# Patient Record
Sex: Female | Born: 1960 | Race: Black or African American | Hispanic: No | Smoking: Former smoker
Health system: Southern US, Community
[De-identification: ages and names within clinical notes are randomized; demographics above are authoritative.]

## PROBLEM LIST (undated history)

## (undated) DIAGNOSIS — J449 Chronic obstructive pulmonary disease, unspecified: Secondary | ICD-10-CM

## (undated) DIAGNOSIS — K219 Gastro-esophageal reflux disease without esophagitis: Secondary | ICD-10-CM

## (undated) HISTORY — PX: COLONOSCOPY WITH ESOPHAGOGASTRODUODENOSCOPY (EGD): SHX5779

---

## 2005-03-06 ENCOUNTER — Emergency Department: Payer: Self-pay | Admitting: Emergency Medicine

## 2006-03-12 ENCOUNTER — Ambulatory Visit: Payer: Self-pay | Admitting: Family Medicine

## 2008-03-05 ENCOUNTER — Ambulatory Visit: Payer: Self-pay | Admitting: Family Medicine

## 2009-11-01 ENCOUNTER — Emergency Department: Payer: Self-pay | Admitting: Internal Medicine

## 2010-08-07 ENCOUNTER — Ambulatory Visit: Payer: Self-pay | Admitting: Gastroenterology

## 2010-08-09 LAB — PATHOLOGY REPORT

## 2010-09-13 ENCOUNTER — Ambulatory Visit: Payer: Self-pay

## 2012-03-05 ENCOUNTER — Ambulatory Visit: Payer: Self-pay | Admitting: Family Medicine

## 2013-03-23 ENCOUNTER — Ambulatory Visit: Payer: Self-pay | Admitting: Nurse Practitioner

## 2014-04-27 ENCOUNTER — Ambulatory Visit: Payer: Self-pay | Admitting: Family Medicine

## 2015-06-23 ENCOUNTER — Other Ambulatory Visit: Payer: Self-pay | Admitting: Family Medicine

## 2015-06-23 DIAGNOSIS — Z1239 Encounter for other screening for malignant neoplasm of breast: Secondary | ICD-10-CM

## 2015-06-29 ENCOUNTER — Ambulatory Visit
Admission: RE | Admit: 2015-06-29 | Discharge: 2015-06-29 | Disposition: A | Discharge status: Home / Self Care | Payer: BLUE CROSS/BLUE SHIELD | Source: Ambulatory Visit | Attending: Family Medicine | Admitting: Family Medicine

## 2015-06-29 DIAGNOSIS — Z1239 Encounter for other screening for malignant neoplasm of breast: Secondary | ICD-10-CM

## 2015-06-29 DIAGNOSIS — Z1231 Encounter for screening mammogram for malignant neoplasm of breast: Secondary | ICD-10-CM | POA: Diagnosis present

## 2015-11-09 ENCOUNTER — Encounter: Payer: Self-pay | Admitting: *Deleted

## 2015-11-14 ENCOUNTER — Encounter
Admission: RE | Disposition: A | Discharge status: Home / Self Care | Payer: Self-pay | Source: Ambulatory Visit | Attending: Gastroenterology

## 2015-11-14 ENCOUNTER — Ambulatory Visit
Admission: RE | Admit: 2015-11-14 | Discharge: 2015-11-14 | Disposition: A | Discharge status: Home / Self Care | Payer: BLUE CROSS/BLUE SHIELD | Source: Ambulatory Visit | Attending: Gastroenterology | Admitting: Gastroenterology

## 2015-11-14 ENCOUNTER — Encounter: Payer: Self-pay | Admitting: *Deleted

## 2015-11-14 ENCOUNTER — Ambulatory Visit: Payer: BLUE CROSS/BLUE SHIELD | Admitting: Anesthesiology

## 2015-11-14 DIAGNOSIS — K210 Gastro-esophageal reflux disease with esophagitis: Secondary | ICD-10-CM | POA: Insufficient documentation

## 2015-11-14 DIAGNOSIS — Z833 Family history of diabetes mellitus: Secondary | ICD-10-CM | POA: Diagnosis not present

## 2015-11-14 DIAGNOSIS — D123 Benign neoplasm of transverse colon: Secondary | ICD-10-CM | POA: Insufficient documentation

## 2015-11-14 DIAGNOSIS — Z8249 Family history of ischemic heart disease and other diseases of the circulatory system: Secondary | ICD-10-CM | POA: Insufficient documentation

## 2015-11-14 DIAGNOSIS — D124 Benign neoplasm of descending colon: Secondary | ICD-10-CM | POA: Insufficient documentation

## 2015-11-14 DIAGNOSIS — Z800 Family history of malignant neoplasm of digestive organs: Secondary | ICD-10-CM | POA: Insufficient documentation

## 2015-11-14 DIAGNOSIS — F17210 Nicotine dependence, cigarettes, uncomplicated: Secondary | ICD-10-CM | POA: Diagnosis not present

## 2015-11-14 DIAGNOSIS — J449 Chronic obstructive pulmonary disease, unspecified: Secondary | ICD-10-CM | POA: Insufficient documentation

## 2015-11-14 DIAGNOSIS — Z79899 Other long term (current) drug therapy: Secondary | ICD-10-CM | POA: Diagnosis not present

## 2015-11-14 DIAGNOSIS — D122 Benign neoplasm of ascending colon: Secondary | ICD-10-CM | POA: Diagnosis not present

## 2015-11-14 DIAGNOSIS — Z86010 Personal history of colonic polyps: Secondary | ICD-10-CM | POA: Insufficient documentation

## 2015-11-14 HISTORY — DX: Gastro-esophageal reflux disease without esophagitis: K21.9

## 2015-11-14 HISTORY — PX: COLONOSCOPY WITH PROPOFOL: SHX5780

## 2015-11-14 HISTORY — PX: ESOPHAGOGASTRODUODENOSCOPY (EGD) WITH PROPOFOL: SHX5813

## 2015-11-14 SURGERY — COLONOSCOPY WITH PROPOFOL
Anesthesia: General

## 2015-11-14 MED ORDER — PROPOFOL 10 MG/ML IV BOLUS
INTRAVENOUS | Status: DC | PRN
  Administered 2015-11-14 (×3): 50 mg via INTRAVENOUS

## 2015-11-14 MED ORDER — SODIUM CHLORIDE 0.9 % IV SOLN: INTRAVENOUS | Status: DC

## 2015-11-14 MED ORDER — SODIUM CHLORIDE 0.9 % IV SOLN
INTRAVENOUS | Status: DC
  Administered 2015-11-14: 1000 mL via INTRAVENOUS

## 2015-11-14 MED ORDER — PROPOFOL 500 MG/50ML IV EMUL
INTRAVENOUS | Status: DC | PRN
  Administered 2015-11-14: 150 ug/kg/min via INTRAVENOUS

## 2015-11-14 NOTE — H&P (Signed)
  Date of Initial H&P: 10/24/2015  History reviewed, patient examined, no change in status, stable for surgery.

## 2015-11-14 NOTE — Anesthesia Postprocedure Evaluation (Signed)
Anesthesia Post Note  Patient: Brittney Fisher  Procedure(s) Performed: Procedure(s) (LRB): COLONOSCOPY WITH PROPOFOL (N/A) ESOPHAGOGASTRODUODENOSCOPY (EGD) WITH PROPOFOL (N/A)  Patient location during evaluation: PACU Anesthesia Type: General Level of consciousness: awake and alert Pain management: pain level controlled Vital Signs Assessment: post-procedure vital signs reviewed and stable Respiratory status: spontaneous breathing Cardiovascular status: stable Anesthetic complications: no    Last Vitals:  Filed Vitals:   11/14/15 0943 11/14/15 1044  BP: 131/75 112/68  Pulse: 82 87  Temp: 36.7 C 36 C  Resp: 20 16    Last Pain: There were no vitals filed for this visit.               VAN STAVEREN,[name]

## 2015-11-14 NOTE — Op Note (Signed)
[facility] Patient Name: Brittney Fisher Procedure Date: 11/14/2015 10:09 AM MRN: [id number] Account #: [id number] Date of Birth: 1961/08/12 Admit Type: Outpatient Age: 54 Room: [facility] ENDO ROOM 4 Gender: Female Note Status: Finalized Procedure:         Colonoscopy Indications:       Personal history of colonic polyps Providers:         Lupita Dawn. Candace Cruise, MD Referring MD:      Forest Gleason Md, MD (Referring MD) Medicines:         Monitored Anesthesia Care Complications:     No immediate complications. Procedure:         Pre-Anesthesia Assessment:                    - Prior to the procedure, a History and Physical was                     performed, and patient medications, allergies and                     sensitivities were reviewed. The patient's tolerance of                     previous anesthesia was reviewed.                    - The risks and benefits of the procedure and the sedation                     options and risks were discussed with the patient. All                     questions were answered and informed consent was obtained.                    - After reviewing the risks and benefits, the patient was                     deemed in satisfactory condition to undergo the procedure.                    After obtaining informed consent, the colonoscope was                     passed under direct vision. Throughout the procedure, the                     patient's blood pressure, pulse, and oxygen saturations                     were monitored continuously. The Colonoscope was                     introduced through the anus and advanced to the the cecum,                     identified by appendiceal orifice and ileocecal valve. The                     colonoscopy was performed without difficulty. The patient                     tolerated the procedure well. The quality of the bowel  preparation was good. Findings:      A medium  polyp was found in the ascending colon. The polyp was sessile.       The polyp was removed with a hot snare. Resection and retrieval were       complete.      A small polyp was found in the ascending colon. The polyp was sessile.       The polyp was removed with a cold snare. Resection and retrieval were       complete.      A small polyp was found at the hepatic flexure. The polyp was sessile.       The polyp was removed with a jumbo cold forceps. Resection and retrieval       were complete.      A small polyp was found in the descending colon. The polyp was sessile.       The polyp was removed with a jumbo cold forceps. Resection and retrieval       were complete.      The exam was otherwise without abnormality. Impression:        - One medium polyp in the ascending colon. Resected and                     retrieved.                    - One small polyp in the ascending colon. Resected and                     retrieved.                    - One small polyp at the hepatic flexure. Resected and                     retrieved.                    - One small polyp in the descending colon. Resected and                     retrieved.                    - The examination was otherwise normal. Recommendation:    - Discharge patient to home.                    - Await pathology results.                    - Repeat colonoscopy in 3 years for surveillance.                    - The findings and recommendations were discussed with the                     patient. Procedure Code(s): --- Professional ---                    [phone], Colonoscopy, flexible; with removal of tumor(s),                     polyp(s), or other lesion(s) by snare technique                    44034, 82, Colonoscopy, flexible; with biopsy, single or  multiple Diagnosis Code(s): --- Professional ---                    D12.2, Benign neoplasm of ascending colon                    D12.3, Benign neoplasm of  transverse colon                    D12.4, Benign neoplasm of descending colon                    Z86.010, Personal history of colonic polyps CPT copyright 2014 American Medical Association. All rights reserved. The codes documented in this report are preliminary and upon coder review may  be revised to meet current compliance requirements. Hulen Luster, MD 11/14/2015 10:42:22 AM This report has been signed electronically. Number of Addenda: 0 Note Initiated On: 11/14/2015 10:09 AM Scope Withdrawal Time: 0 hours 15 minutes 26 seconds  Total Procedure Duration: 0 hours 18 minutes 58 seconds       [facility]

## 2015-11-14 NOTE — Anesthesia Preprocedure Evaluation (Signed)
Anesthesia Evaluation  Patient identified by MRN, date of birth, ID band Patient awake    Reviewed: Allergy & Precautions, NPO status , Patient's Chart, lab work & pertinent test results  Airway Mallampati: II       Dental  (+) Upper Dentures   Pulmonary COPD, Current Smoker,     + decreased breath sounds      Cardiovascular Exercise Tolerance: Good  Rhythm:Regular Rate:Normal     Neuro/Psych    GI/Hepatic Neg liver ROS, GERD  ,  Endo/Other  negative endocrine ROS  Renal/GU negative Renal ROS     Musculoskeletal   Abdominal Normal abdominal exam  (+)   Peds  Hematology negative hematology ROS (+)   Anesthesia Other Findings   Reproductive/Obstetrics                             Anesthesia Physical Anesthesia Plan  ASA: II  Anesthesia Plan: General   Post-op Pain Management:    Induction: Intravenous  Airway Management Planned: Nasal Cannula  Additional Equipment:   Intra-op Plan:   Post-operative Plan:   Informed Consent: I have reviewed the patients History and Physical, chart, labs and discussed the procedure including the risks, benefits and alternatives for the proposed anesthesia with the patient or authorized representative who has indicated his/her understanding and acceptance.     Plan Discussed with: CRNA  Anesthesia Plan Comments:         Anesthesia Quick Evaluation

## 2015-11-14 NOTE — Op Note (Signed)
[facility] Patient Name: Brittney Fisher Procedure Date: 11/14/2015 10:09 AM MRN: [id number] Account #: [id number] Date of Birth: 1960-12-20 Admit Type: Outpatient Age: 54 Room: [facility] ENDO ROOM 4 Gender: Female Note Status: Finalized Procedure:         Upper GI endoscopy Indications:       Suspected esophageal reflux Providers:         Lupita Dawn. Candace Cruise, MD Referring MD:      Forest Gleason Md, MD (Referring MD) Medicines:         Monitored Anesthesia Care Complications:     No immediate complications. Procedure:         Pre-Anesthesia Assessment:                    - Prior to the procedure, a History and Physical was                     performed, and patient medications, allergies and                     sensitivities were reviewed. The patient's tolerance of                     previous anesthesia was reviewed.                    - The risks and benefits of the procedure and the sedation                     options and risks were discussed with the patient. All                     questions were answered and informed consent was obtained.                    - After reviewing the risks and benefits, the patient was                     deemed in satisfactory condition to undergo the procedure.                    After obtaining informed consent, the endoscope was passed                     under direct vision. Throughout the procedure, the                     patient's blood pressure, pulse, and oxygen saturations                     were monitored continuously. The Olympus GIF-160 endoscope                     (S#. [id number]) was introduced through the mouth, and                     advanced to the second part of duodenum. The upper GI                     endoscopy was accomplished without difficulty. The patient                     tolerated the procedure well. Findings:      The examined esophagus was normal. Biopsies were taken with a  cold       forceps  for histology.      The entire examined stomach was normal.      The examined duodenum was normal. Impression:        - Normal esophagus. Biopsied.                    - Normal stomach.                    - Normal examined duodenum. Recommendation:    - Discharge patient to home.                    - Observe patient's clinical course.                    - Await pathology results.                    - The findings and recommendations were discussed with the                     patient. Procedure Code(s): --- Professional ---                    [phone], Esophagogastroduodenoscopy, flexible, transoral;                     with biopsy, single or multiple CPT copyright 2014 American Medical Association. All rights reserved. The codes documented in this report are preliminary and upon coder review may  be revised to meet current compliance requirements. Hulen Luster, MD 11/14/2015 10:18:34 AM This report has been signed electronically. Number of Addenda: 0 Note Initiated On: 11/14/2015 10:09 AM      [facility]

## 2015-11-14 NOTE — Transfer of Care (Signed)
Immediate Anesthesia Transfer of Care Note  Patient: Brittney Fisher  Procedure(s) Performed: Procedure(s): COLONOSCOPY WITH PROPOFOL (N/A) ESOPHAGOGASTRODUODENOSCOPY (EGD) WITH PROPOFOL (N/A)  Patient Location: PACU  Anesthesia Type:General  Level of Consciousness: awake and alert   Airway & Oxygen Therapy: Patient Spontanous Breathing and Patient connected to nasal cannula oxygen  Post-op Assessment: Report given to RN and Post -op Vital signs reviewed and stable  Post vital signs: stable  Last Vitals:  Filed Vitals:   11/14/15 0943  BP: 131/75  Pulse: 82  Temp: 36.7 C  Resp: 20    Complications: No apparent anesthesia complications

## 2015-11-15 LAB — SURGICAL PATHOLOGY

## 2015-11-16 ENCOUNTER — Encounter: Payer: Self-pay | Admitting: Gastroenterology

## 2016-07-18 ENCOUNTER — Other Ambulatory Visit: Payer: Self-pay | Admitting: Family Medicine

## 2018-07-07 ENCOUNTER — Other Ambulatory Visit: Payer: Self-pay | Admitting: Family Medicine

## 2018-07-07 DIAGNOSIS — Z1231 Encounter for screening mammogram for malignant neoplasm of breast: Secondary | ICD-10-CM

## 2018-07-25 ENCOUNTER — Ambulatory Visit
Admission: RE | Admit: 2018-07-25 | Discharge: 2018-07-25 | Disposition: A | Discharge status: Home / Self Care | Payer: BLUE CROSS/BLUE SHIELD | Source: Ambulatory Visit | Attending: Family Medicine | Admitting: Family Medicine

## 2018-07-25 DIAGNOSIS — Z1231 Encounter for screening mammogram for malignant neoplasm of breast: Secondary | ICD-10-CM | POA: Insufficient documentation

## 2020-06-08 ENCOUNTER — Other Ambulatory Visit: Payer: Self-pay | Admitting: Family Medicine

## 2020-06-08 DIAGNOSIS — Z1231 Encounter for screening mammogram for malignant neoplasm of breast: Secondary | ICD-10-CM

## 2020-09-12 ENCOUNTER — Ambulatory Visit
Admission: RE | Admit: 2020-09-12 | Discharge: 2020-09-12 | Disposition: A | Discharge status: Home / Self Care | Payer: BC Managed Care – PPO | Source: Ambulatory Visit | Attending: Family Medicine | Admitting: Family Medicine

## 2020-09-12 ENCOUNTER — Other Ambulatory Visit: Payer: Self-pay

## 2020-09-12 DIAGNOSIS — Z1231 Encounter for screening mammogram for malignant neoplasm of breast: Secondary | ICD-10-CM | POA: Insufficient documentation

## 2020-09-29 ENCOUNTER — Telehealth: Payer: Self-pay

## 2020-09-29 DIAGNOSIS — Z122 Encounter for screening for malignant neoplasm of respiratory organs: Secondary | ICD-10-CM

## 2020-09-29 DIAGNOSIS — Z87891 Personal history of nicotine dependence: Secondary | ICD-10-CM

## 2020-09-29 NOTE — Telephone Encounter (Signed)
Contacted patient for lung CT screening clinic after getting referral from Ranae Plumber, [place].  Patient agreeable to participate in program and scan scheduled for Thursday, Nov 11 at 10:15.  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  She has asked our clinic to mail her a letter with address to imaging center, appt time and date and phone number to navigator.  She is a former smoker who stopped smoking 1 month ago.  She started smoking at age 59 and smoked 100% of 1 pack per day, never over 1 pack as she jokes that she couldn't afford more than one pack.  She was grateful for the call.

## 2020-09-30 ENCOUNTER — Encounter: Payer: Self-pay | Admitting: *Deleted

## 2020-09-30 NOTE — Addendum Note (Signed)
Addended by: Lieutenant Diego on: 09/30/2020 12:34 PM   Modules accepted: Orders

## 2020-09-30 NOTE — Telephone Encounter (Signed)
Former smoker, quit 08/30/20, 40 pack year

## 2020-10-04 ENCOUNTER — Ambulatory Visit
Admission: RE | Admit: 2020-10-04 | Discharge: 2020-10-04 | Disposition: A | Discharge status: Home / Self Care | Payer: BC Managed Care – PPO | Source: Ambulatory Visit | Attending: Family Medicine | Admitting: Family Medicine

## 2020-10-04 ENCOUNTER — Other Ambulatory Visit: Payer: Self-pay | Admitting: Family Medicine

## 2020-10-04 DIAGNOSIS — J441 Chronic obstructive pulmonary disease with (acute) exacerbation: Secondary | ICD-10-CM

## 2020-10-07 ENCOUNTER — Other Ambulatory Visit: Payer: Self-pay | Admitting: Family Medicine

## 2020-10-07 DIAGNOSIS — J910 Malignant pleural effusion: Secondary | ICD-10-CM

## 2020-10-10 ENCOUNTER — Other Ambulatory Visit: Payer: Self-pay | Admitting: Family Medicine

## 2020-10-18 ENCOUNTER — Ambulatory Visit
Admission: RE | Admit: 2020-10-18 | Discharge: 2020-10-18 | Disposition: A | Discharge status: Home / Self Care | Payer: BC Managed Care – PPO | Source: Ambulatory Visit | Attending: Family Medicine | Admitting: Family Medicine

## 2020-10-18 DIAGNOSIS — J910 Malignant pleural effusion: Secondary | ICD-10-CM

## 2020-10-18 MED ORDER — IOPAMIDOL (ISOVUE-300) INJECTION 61%
75.0000 mL | Freq: Once | INTRAVENOUS | Status: AC | PRN
  Administered 2020-10-18: 75 mL via INTRAVENOUS

## 2020-10-19 ENCOUNTER — Other Ambulatory Visit: Payer: Self-pay

## 2020-10-19 ENCOUNTER — Encounter: Payer: Self-pay | Admitting: *Deleted

## 2020-10-19 DIAGNOSIS — Z20822 Contact with and (suspected) exposure to covid-19: Secondary | ICD-10-CM | POA: Diagnosis present

## 2020-10-19 DIAGNOSIS — Z716 Tobacco abuse counseling: Secondary | ICD-10-CM

## 2020-10-19 DIAGNOSIS — C3411 Malignant neoplasm of upper lobe, right bronchus or lung: Principal | ICD-10-CM | POA: Diagnosis present

## 2020-10-19 DIAGNOSIS — Z79899 Other long term (current) drug therapy: Secondary | ICD-10-CM

## 2020-10-19 DIAGNOSIS — C782 Secondary malignant neoplasm of pleura: Secondary | ICD-10-CM | POA: Diagnosis present

## 2020-10-19 DIAGNOSIS — R0602 Shortness of breath: Secondary | ICD-10-CM | POA: Diagnosis not present

## 2020-10-19 DIAGNOSIS — F17200 Nicotine dependence, unspecified, uncomplicated: Secondary | ICD-10-CM | POA: Diagnosis present

## 2020-10-19 DIAGNOSIS — F419 Anxiety disorder, unspecified: Secondary | ICD-10-CM | POA: Diagnosis present

## 2020-10-19 DIAGNOSIS — J441 Chronic obstructive pulmonary disease with (acute) exacerbation: Secondary | ICD-10-CM | POA: Diagnosis present

## 2020-10-19 DIAGNOSIS — J209 Acute bronchitis, unspecified: Secondary | ICD-10-CM | POA: Diagnosis present

## 2020-10-19 DIAGNOSIS — J9601 Acute respiratory failure with hypoxia: Secondary | ICD-10-CM | POA: Diagnosis present

## 2020-10-19 DIAGNOSIS — I871 Compression of vein: Secondary | ICD-10-CM | POA: Diagnosis present

## 2020-10-19 DIAGNOSIS — F17210 Nicotine dependence, cigarettes, uncomplicated: Secondary | ICD-10-CM | POA: Diagnosis present

## 2020-10-19 DIAGNOSIS — E878 Other disorders of electrolyte and fluid balance, not elsewhere classified: Secondary | ICD-10-CM | POA: Diagnosis present

## 2020-10-19 DIAGNOSIS — J910 Malignant pleural effusion: Secondary | ICD-10-CM | POA: Diagnosis present

## 2020-10-19 DIAGNOSIS — C771 Secondary and unspecified malignant neoplasm of intrathoracic lymph nodes: Secondary | ICD-10-CM | POA: Diagnosis present

## 2020-10-19 DIAGNOSIS — J440 Chronic obstructive pulmonary disease with acute lower respiratory infection: Secondary | ICD-10-CM | POA: Diagnosis present

## 2020-10-19 DIAGNOSIS — R634 Abnormal weight loss: Secondary | ICD-10-CM | POA: Diagnosis present

## 2020-10-19 DIAGNOSIS — E860 Dehydration: Secondary | ICD-10-CM | POA: Diagnosis present

## 2020-10-19 DIAGNOSIS — Z6821 Body mass index (BMI) 21.0-21.9, adult: Secondary | ICD-10-CM

## 2020-10-19 DIAGNOSIS — E871 Hypo-osmolality and hyponatremia: Secondary | ICD-10-CM | POA: Diagnosis present

## 2020-10-19 DIAGNOSIS — K219 Gastro-esophageal reflux disease without esophagitis: Secondary | ICD-10-CM | POA: Diagnosis present

## 2020-10-19 DIAGNOSIS — E440 Moderate protein-calorie malnutrition: Secondary | ICD-10-CM | POA: Diagnosis present

## 2020-10-19 DIAGNOSIS — C7951 Secondary malignant neoplasm of bone: Secondary | ICD-10-CM | POA: Diagnosis present

## 2020-10-19 LAB — CBC
HCT: 38 % (ref 36.0–46.0)
Hemoglobin: 12.7 g/dL (ref 12.0–15.0)
MCH: 26.8 pg (ref 26.0–34.0)
MCHC: 33.4 g/dL (ref 30.0–36.0)
MCV: 80.2 fL (ref 80.0–100.0)
Platelets: 276 10*3/uL (ref 150–400)
RBC: 4.74 MIL/uL (ref 3.87–5.11)
RDW: 13.9 % (ref 11.5–15.5)
WBC: 13.5 10*3/uL — ABNORMAL HIGH (ref 4.0–10.5)
nRBC: 0 % (ref 0.0–0.2)

## 2020-10-19 LAB — BASIC METABOLIC PANEL
Anion gap: 11 (ref 5–15)
BUN: 18 mg/dL (ref 8–23)
CO2: 26 mmol/L (ref 22–32)
Calcium: 8.6 mg/dL — ABNORMAL LOW (ref 8.9–10.3)
Chloride: 97 mmol/L — ABNORMAL LOW (ref 98–111)
Creatinine, Ser: 0.81 mg/dL (ref 0.44–1.00)
GFR, Estimated: >60 mL/min (ref >60)
Glucose, Bld: 138 mg/dL — ABNORMAL HIGH (ref 70–99)
Potassium: 3.6 mmol/L (ref 3.5–5.1)
Sodium: 134 mmol/L — ABNORMAL LOW (ref 135–145)

## 2020-10-19 LAB — TROPONIN I (HIGH SENSITIVITY): Troponin I (High Sensitivity): 6 ng/L (ref <18)

## 2020-10-19 NOTE — ED Triage Notes (Addendum)
Pt to ED via EMS from home reporting recurrent dizziness, SOB and chest pain for the past month.   Pt was dx with lung cancer yesterday but has not started treatment. Cancer was found after pt started losing wight recently. Pt also added she was dx with COPD 2 months ago and has had a dry cough since.

## 2020-10-19 NOTE — ED Notes (Signed)
Per Dr. Archie Balboa, Chest Xray not needed at this time.

## 2020-10-20 ENCOUNTER — Encounter: Payer: Self-pay | Admitting: *Deleted

## 2020-10-20 ENCOUNTER — Inpatient Hospital Stay: Payer: BC Managed Care – PPO

## 2020-10-20 ENCOUNTER — Emergency Department: Payer: BC Managed Care – PPO

## 2020-10-20 ENCOUNTER — Inpatient Hospital Stay
Admission: EM | Admit: 2020-10-20 | Discharge: 2020-10-22 | DRG: 180 | Disposition: A | Discharge status: Home / Self Care | Payer: BC Managed Care – PPO | Attending: Internal Medicine | Admitting: Internal Medicine

## 2020-10-20 ENCOUNTER — Encounter: Payer: Self-pay | Admitting: Family Medicine

## 2020-10-20 DIAGNOSIS — E871 Hypo-osmolality and hyponatremia: Secondary | ICD-10-CM | POA: Diagnosis present

## 2020-10-20 DIAGNOSIS — E440 Moderate protein-calorie malnutrition: Secondary | ICD-10-CM | POA: Diagnosis present

## 2020-10-20 DIAGNOSIS — J209 Acute bronchitis, unspecified: Secondary | ICD-10-CM | POA: Diagnosis present

## 2020-10-20 DIAGNOSIS — R531 Weakness: Secondary | ICD-10-CM

## 2020-10-20 DIAGNOSIS — R591 Generalized enlarged lymph nodes: Secondary | ICD-10-CM

## 2020-10-20 DIAGNOSIS — F17210 Nicotine dependence, cigarettes, uncomplicated: Secondary | ICD-10-CM | POA: Diagnosis present

## 2020-10-20 DIAGNOSIS — J910 Malignant pleural effusion: Secondary | ICD-10-CM

## 2020-10-20 DIAGNOSIS — C782 Secondary malignant neoplasm of pleura: Secondary | ICD-10-CM | POA: Diagnosis present

## 2020-10-20 DIAGNOSIS — Z6821 Body mass index (BMI) 21.0-21.9, adult: Secondary | ICD-10-CM | POA: Diagnosis not present

## 2020-10-20 DIAGNOSIS — C771 Secondary and unspecified malignant neoplasm of intrathoracic lymph nodes: Secondary | ICD-10-CM | POA: Diagnosis present

## 2020-10-20 DIAGNOSIS — R42 Dizziness and giddiness: Secondary | ICD-10-CM | POA: Diagnosis not present

## 2020-10-20 DIAGNOSIS — R0602 Shortness of breath: Secondary | ICD-10-CM | POA: Diagnosis not present

## 2020-10-20 DIAGNOSIS — I871 Compression of vein: Secondary | ICD-10-CM | POA: Diagnosis present

## 2020-10-20 DIAGNOSIS — C799 Secondary malignant neoplasm of unspecified site: Secondary | ICD-10-CM | POA: Diagnosis not present

## 2020-10-20 DIAGNOSIS — R918 Other nonspecific abnormal finding of lung field: Secondary | ICD-10-CM | POA: Diagnosis not present

## 2020-10-20 DIAGNOSIS — F17200 Nicotine dependence, unspecified, uncomplicated: Secondary | ICD-10-CM | POA: Diagnosis present

## 2020-10-20 DIAGNOSIS — C7951 Secondary malignant neoplasm of bone: Secondary | ICD-10-CM | POA: Diagnosis present

## 2020-10-20 DIAGNOSIS — F419 Anxiety disorder, unspecified: Secondary | ICD-10-CM | POA: Diagnosis present

## 2020-10-20 DIAGNOSIS — J440 Chronic obstructive pulmonary disease with acute lower respiratory infection: Secondary | ICD-10-CM | POA: Diagnosis present

## 2020-10-20 DIAGNOSIS — C3411 Malignant neoplasm of upper lobe, right bronchus or lung: Secondary | ICD-10-CM | POA: Diagnosis present

## 2020-10-20 DIAGNOSIS — R634 Abnormal weight loss: Secondary | ICD-10-CM | POA: Diagnosis present

## 2020-10-20 DIAGNOSIS — E878 Other disorders of electrolyte and fluid balance, not elsewhere classified: Secondary | ICD-10-CM | POA: Diagnosis present

## 2020-10-20 DIAGNOSIS — Z9889 Other specified postprocedural states: Secondary | ICD-10-CM

## 2020-10-20 DIAGNOSIS — K219 Gastro-esophageal reflux disease without esophagitis: Secondary | ICD-10-CM

## 2020-10-20 DIAGNOSIS — E860 Dehydration: Secondary | ICD-10-CM

## 2020-10-20 DIAGNOSIS — C3490 Malignant neoplasm of unspecified part of unspecified bronchus or lung: Secondary | ICD-10-CM

## 2020-10-20 DIAGNOSIS — Z20822 Contact with and (suspected) exposure to covid-19: Secondary | ICD-10-CM | POA: Diagnosis present

## 2020-10-20 DIAGNOSIS — Z79899 Other long term (current) drug therapy: Secondary | ICD-10-CM | POA: Diagnosis not present

## 2020-10-20 DIAGNOSIS — J441 Chronic obstructive pulmonary disease with (acute) exacerbation: Secondary | ICD-10-CM | POA: Diagnosis not present

## 2020-10-20 DIAGNOSIS — Z716 Tobacco abuse counseling: Secondary | ICD-10-CM | POA: Diagnosis not present

## 2020-10-20 DIAGNOSIS — J9601 Acute respiratory failure with hypoxia: Secondary | ICD-10-CM | POA: Diagnosis present

## 2020-10-20 DIAGNOSIS — J90 Pleural effusion, not elsewhere classified: Secondary | ICD-10-CM | POA: Diagnosis not present

## 2020-10-20 HISTORY — DX: Chronic obstructive pulmonary disease, unspecified: J44.9

## 2020-10-20 LAB — BODY FLUID CELL COUNT WITH DIFFERENTIAL
Lymphs, Fluid: 75 %
Monocyte-Macrophage-Serous Fluid: 18 %
Neutrophil Count, Fluid: 7 %
Total Nucleated Cell Count, Fluid: 1269 cu mm

## 2020-10-20 LAB — URINALYSIS, COMPLETE (UACMP) WITH MICROSCOPIC
Bacteria, UA: NONE SEEN
Bilirubin Urine: NEGATIVE
Glucose, UA: NEGATIVE mg/dL
Hgb urine dipstick: NEGATIVE
Ketones, ur: 20 mg/dL — AB
Leukocytes,Ua: NEGATIVE
Nitrite: NEGATIVE
Protein, ur: NEGATIVE mg/dL
Specific Gravity, Urine: 1.028 (ref 1.005–1.030)
pH: 5 (ref 5.0–8.0)

## 2020-10-20 LAB — PROCALCITONIN: Procalcitonin: <0.1 ng/mL

## 2020-10-20 LAB — RESPIRATORY PANEL BY RT PCR (FLU A&B, COVID)
Influenza A by PCR: NEGATIVE
Influenza B by PCR: NEGATIVE
SARS Coronavirus 2 by RT PCR: NEGATIVE

## 2020-10-20 LAB — LACTIC ACID, PLASMA: Lactic Acid, Venous: 1.4 mmol/L (ref 0.5–1.9)

## 2020-10-20 LAB — TROPONIN I (HIGH SENSITIVITY): Troponin I (High Sensitivity): 7 ng/L (ref <18)

## 2020-10-20 LAB — GLUCOSE, PLEURAL OR PERITONEAL FLUID: Glucose, Fluid: 75 mg/dL

## 2020-10-20 LAB — LACTATE DEHYDROGENASE, PLEURAL OR PERITONEAL FLUID: LD, Fluid: 187 U/L — ABNORMAL HIGH (ref 3–23)

## 2020-10-20 LAB — PROTEIN, PLEURAL OR PERITONEAL FLUID: Total protein, fluid: 4.6 g/dL

## 2020-10-20 MED ORDER — TRAZODONE HCL 50 MG PO TABS: 25.0000 mg | ORAL_TABLET | Freq: Every evening | ORAL | Status: DC | PRN

## 2020-10-20 MED ORDER — HYDROCOD POLST-CPM POLST ER 10-8 MG/5ML PO SUER
5.0000 mL | Freq: Two times a day (BID) | ORAL | Status: DC | PRN
  Administered 2020-10-20 – 2020-10-21 (×2): 5 mL via ORAL
  Filled 2020-10-20 (×2): qty 5

## 2020-10-20 MED ORDER — ACETAMINOPHEN 325 MG PO TABS
650.0000 mg | ORAL_TABLET | Freq: Four times a day (QID) | ORAL | Status: DC | PRN
  Filled 2020-10-20: qty 2

## 2020-10-20 MED ORDER — METHYLPREDNISOLONE SODIUM SUCC 125 MG IJ SOLR
125.0000 mg | Freq: Once | INTRAMUSCULAR | Status: AC
  Administered 2020-10-20: 125 mg via INTRAVENOUS
  Filled 2020-10-20: qty 2

## 2020-10-20 MED ORDER — ACETAMINOPHEN 650 MG RE SUPP: 650.0000 mg | Freq: Four times a day (QID) | RECTAL | Status: DC | PRN

## 2020-10-20 MED ORDER — ENOXAPARIN SODIUM 40 MG/0.4ML ~~LOC~~ SOLN
40.0000 mg | SUBCUTANEOUS | Status: DC
  Administered 2020-10-20 – 2020-10-22 (×3): 40 mg via SUBCUTANEOUS
  Filled 2020-10-20 (×3): qty 0.4

## 2020-10-20 MED ORDER — SODIUM CHLORIDE 0.9 % IV SOLN: INTRAVENOUS | Status: DC

## 2020-10-20 MED ORDER — SODIUM CHLORIDE 0.9 % IV SOLN
500.0000 mg | INTRAVENOUS | Status: DC
  Administered 2020-10-20: 500 mg via INTRAVENOUS
  Filled 2020-10-20: qty 500

## 2020-10-20 MED ORDER — SODIUM CHLORIDE 0.9 % IV BOLUS
1000.0000 mL | Freq: Once | INTRAVENOUS | Status: AC
  Administered 2020-10-20: 1000 mL via INTRAVENOUS

## 2020-10-20 MED ORDER — LEVOFLOXACIN 500 MG PO TABS
750.0000 mg | ORAL_TABLET | Freq: Every day | ORAL | Status: DC
  Administered 2020-10-21 – 2020-10-22 (×2): 750 mg via ORAL
  Filled 2020-10-20 (×2): qty 2

## 2020-10-20 MED ORDER — IPRATROPIUM-ALBUTEROL 0.5-2.5 (3) MG/3ML IN SOLN
3.0000 mL | Freq: Four times a day (QID) | RESPIRATORY_TRACT | Status: DC
  Administered 2020-10-20 – 2020-10-21 (×4): 3 mL via RESPIRATORY_TRACT
  Filled 2020-10-20 (×4): qty 3

## 2020-10-20 MED ORDER — PANTOPRAZOLE SODIUM 40 MG PO TBEC
40.0000 mg | DELAYED_RELEASE_TABLET | Freq: Every day | ORAL | Status: DC
  Administered 2020-10-20 – 2020-10-22 (×3): 40 mg via ORAL
  Filled 2020-10-20 (×3): qty 1

## 2020-10-20 MED ORDER — GUAIFENESIN ER 600 MG PO TB12
600.0000 mg | ORAL_TABLET | Freq: Two times a day (BID) | ORAL | Status: DC
  Administered 2020-10-20 – 2020-10-22 (×5): 600 mg via ORAL
  Filled 2020-10-20 (×5): qty 1

## 2020-10-20 MED ORDER — SODIUM CHLORIDE 0.9 % IV SOLN
2.0000 g | INTRAVENOUS | Status: DC
  Administered 2020-10-20: 2 g via INTRAVENOUS
  Filled 2020-10-20: qty 20

## 2020-10-20 MED ORDER — IPRATROPIUM-ALBUTEROL 0.5-2.5 (3) MG/3ML IN SOLN
3.0000 mL | Freq: Once | RESPIRATORY_TRACT | Status: AC
  Administered 2020-10-20: 3 mL via RESPIRATORY_TRACT
  Filled 2020-10-20: qty 3

## 2020-10-20 MED ORDER — MAGNESIUM HYDROXIDE 400 MG/5ML PO SUSP: 30.0000 mL | Freq: Every day | ORAL | Status: DC | PRN

## 2020-10-20 NOTE — Progress Notes (Signed)
  Oncology Nurse Navigator Documentation  Navigator Location: [facility] (10/20/20 1600) Referral Date to RadOnc/MedOnc: 10/20/20 (10/20/20 1600) )Navigator Encounter Type: Appt/Treatment Plan Review (10/20/20 1600)   Abnormal Finding Date: 10/20/20 (10/20/20 1600)                   Treatment Phase: Abnormal Scans (10/20/20 1600) Barriers/Navigation Needs: Coordination of Care (10/20/20 1600)   Interventions: Coordination of Care (10/20/20 1600)   Coordination of Care: Appts;Radiology (10/20/20 1600)       navigation referral received from Dr. [name]. Pt currently admitted and will need outpatient PET scan. PET scan authorized with insurance. Order placed and pt will be scheduled once discharged. Will follow up with patient after discharge.           Time Spent with Patient: 30 (10/20/20 1600)

## 2020-10-20 NOTE — Progress Notes (Signed)
Report given to [place] on 1A

## 2020-10-20 NOTE — Progress Notes (Signed)
   10/20/20 1828  Assess: MEWS Score  Temp 98.2 F (36.8 C)  BP 108/62  Pulse Rate (!) 113  Resp 18  SpO2 96 %  O2 Device Nasal Cannula  O2 Flow Rate (L/min) 2 L/min  Assess: MEWS Score  MEWS Temp 0  MEWS Systolic 0  MEWS Pulse 2  MEWS RR 0  MEWS LOC 0  MEWS Score 2  MEWS Score Color Yellow  Assess: if the MEWS score is Yellow or Red  Were vital signs taken at a resting state? Yes  Focused Assessment No change from prior assessment  Early Detection of Sepsis Score *See Row Information* Low  MEWS guidelines implemented *See Row Information* Yes  Treat  MEWS Interventions Escalated (See documentation below)  Take Vital Signs  Increase Vital Sign Frequency  Yellow: Q 2hr X 2 then Q 4hr X 2, if remains yellow, continue Q 4hrs  Escalate  MEWS: Escalate Yellow: discuss with charge nurse/RN and consider discussing with provider and RRT  Notify: Charge Nurse/RN  Name of Charge Nurse/RN Notified Wilder Glade  Date Charge Nurse/RN Notified 10/20/20  Time Charge Nurse/RN Notified 1750  Document  Patient Outcome Stabilized after interventions  Progress note created (see row info) Yes

## 2020-10-20 NOTE — Consult Note (Signed)
Hematology/Oncology Consult note [facility] [phone] [phone]  Patient Care Team: Ranae Plumber, [place] as PCP - General (Family Medicine)   Name of the patient: Brittney Fisher  [id number]  06/28/1961   Date of visit: 10/20/20 REASON FOR COSULTATION:  Likely newly diagnosed lung cancer History of presenting illness-  59 y.o. female with PMH listed at below who presents to ER for evaluation of worsening dyspnea associated with chest pain as well as generalized weakness.  Recent diagnosis of COPD about 2 months ago.  She also has had cough with whitish sputum and wheezing.10/04/2020, patient had chest x-ray which showed multiple pulmonary nodules bilaterally, opacity in the right base, moderate to large right pleural effusion In emergency room, patient was tachycardic with heart rate of 116, hypoxic upon ambulation started on oxygen via nasal cannula, 10/18/2020, CT chest with contrast showed central right upper lobe bronchogenic mass with direct mediastinal invasion, 7.1 x 6.4cm.  Metastatic disease to low cervicothoracic nodes, months, right pleural space and bones.  Moderate to large right pleural effusion.  SVC narrowing suggesting impending SVC syndrome.  Patient underwent ultrasound-guided right thoracentesis in the emergency room.  I saw patient post surgery.  She reports slight improvement of her breathing status. Patient lives by herself.  No children, closest family member is her niece.  No nausea vomiting abdominal pain, headache, vision change. Patient has experienced significant weight loss about 30 to 40 pounds for the past 6 months.    Review of Systems  Constitutional: Positive for appetite change and unexpected weight change. Negative for chills, fatigue and fever.  HENT:   Negative for hearing loss and voice change.   Eyes: Negative for eye problems.  Respiratory: Positive for cough and shortness of breath. Negative for chest  tightness.   Cardiovascular: Negative for chest pain.  Gastrointestinal: Negative for abdominal distention, abdominal pain and blood in stool.  Endocrine: Negative for hot flashes.  Genitourinary: Negative for difficulty urinating and frequency.   Musculoskeletal: Negative for arthralgias.  Skin: Negative for itching and rash.  Neurological: Negative for extremity weakness.  Hematological: Negative for adenopathy.  Psychiatric/Behavioral: Negative for confusion.    No Known Allergies  Patient Active Problem List   Diagnosis Date Noted  . Pleural effusion on right 10/20/2020     Past Medical History:  Diagnosis Date  . GERD (gastroesophageal reflux disease)      Past Surgical History:  Procedure Laterality Date  . COLONOSCOPY WITH ESOPHAGOGASTRODUODENOSCOPY (EGD)    . COLONOSCOPY WITH PROPOFOL N/A 11/14/2015   Procedure: COLONOSCOPY WITH PROPOFOL;  Surgeon: Hulen Luster, MD;  Location: [facility] ENDOSCOPY;  Service: Gastroenterology;  Laterality: N/A;  . ESOPHAGOGASTRODUODENOSCOPY (EGD) WITH PROPOFOL N/A 11/14/2015   Procedure: ESOPHAGOGASTRODUODENOSCOPY (EGD) WITH PROPOFOL;  Surgeon: Hulen Luster, MD;  Location: [facility] ENDOSCOPY;  Service: Gastroenterology;  Laterality: N/A;    Social History   Socioeconomic History  . Marital status: Married    Spouse name: Not on file  . Number of children: Not on file  . Years of education: Not on file  . Highest education level: Not on file  Occupational History  . Not on file  Tobacco Use  . Smoking status: Current Every Day Smoker    Packs/day: 0.20    Types: Cigarettes  . Smokeless tobacco: Never Used  Substance and Sexual Activity  . Alcohol use: Never  . Drug use: Never  . Sexual activity: Not Currently  Other Topics Concern  . Not on file  Social History Narrative  . Not on file   Social Determinants of Health   Financial Resource Strain:   . Difficulty of Paying Living Expenses: Not on file  Food Insecurity:   . Worried  About Charity fundraiser in the Last Year: Not on file  . Ran Out of Food in the Last Year: Not on file  Transportation Needs:   . Lack of Transportation (Medical): Not on file  . Lack of Transportation (Non-Medical): Not on file  Physical Activity:   . Days of Exercise per Week: Not on file  . Minutes of Exercise per Session: Not on file  Stress:   . Feeling of Stress : Not on file  Social Connections:   . Frequency of Communication with Friends and Family: Not on file  . Frequency of Social Gatherings with Friends and Family: Not on file  . Attends Religious Services: Not on file  . Active Member of Clubs or Organizations: Not on file  . Attends Archivist Meetings: Not on file  . Marital Status: Not on file  Intimate Partner Violence:   . Fear of Current or Ex-Partner: Not on file  . Emotionally Abused: Not on file  . Physically Abused: Not on file  . Sexually Abused: Not on file     Family History  Problem Relation Age of Onset  . Breast cancer Cousin 44  . Diabetes type II Mother   . Hypertension Mother   . Colon cancer Father   . Hypertension Father      Current Facility-Administered Medications:  .  0.9 %  sodium chloride infusion, , Intravenous, Continuous, Mansy, Jan A, MD, Last Rate: 100 mL/hr at 10/20/20 0723, New Bag at 10/20/20 0723 .  acetaminophen (TYLENOL) tablet 650 mg, 650 mg, Oral, Q6H PRN **OR** acetaminophen (TYLENOL) suppository 650 mg, 650 mg, Rectal, Q6H PRN, Mansy, Jan A, MD .  azithromycin (ZITHROMAX) 500 mg in sodium chloride 0.9 % 250 mL IVPB, 500 mg, Intravenous, Q24H, Mansy, Arvella Merles, MD, Stopped at 10/20/20 0914 .  cefTRIAXone (ROCEPHIN) 2 g in sodium chloride 0.9 % 100 mL IVPB, 2 g, Intravenous, Q24H, Mansy, Arvella Merles, MD, Stopped at 10/20/20 0803 .  chlorpheniramine-HYDROcodone (TUSSIONEX) 10-8 MG/5ML suspension 5 mL, 5 mL, Oral, Q12H PRN, Mansy, Jan A, MD .  enoxaparin (LOVENOX) injection 40 mg, 40 mg, Subcutaneous, Q24H, Mansy, Jan A, MD,  40 mg at 10/20/20 0805 .  guaiFENesin (MUCINEX) 12 hr tablet 600 mg, 600 mg, Oral, BID, Mansy, Jan A, MD, 600 mg at 10/20/20 0802 .  ipratropium-albuterol (DUONEB) 0.5-2.5 (3) MG/3ML nebulizer solution 3 mL, 3 mL, Nebulization, QID, Mansy, Jan A, MD, 3 mL at 10/20/20 0801 .  magnesium hydroxide (MILK OF MAGNESIA) suspension 30 mL, 30 mL, Oral, Daily PRN, Mansy, Jan A, MD .  pantoprazole (PROTONIX) EC tablet 40 mg, 40 mg, Oral, Daily, Mansy, Jan A, MD, 40 mg at 10/20/20 0802 .  traZODone (DESYREL) tablet 25 mg, 25 mg, Oral, QHS PRN, Mansy, Arvella Merles, MD  Current Outpatient Medications:  .  acetaminophen (TYLENOL) 500 MG tablet, Take 1,000 mg by mouth every 8 (eight) hours as needed for moderate pain., Disp: , Rfl:  .  omeprazole (PRILOSEC) 20 MG capsule, Take 20 mg by mouth daily., Disp: , Rfl:  .  tiotropium (SPIRIVA HANDIHALER) 18 MCG inhalation capsule, Place 18 mcg into inhaler and inhale daily., Disp: , Rfl:    Physical exam:  Vitals:   10/20/20 4496 10/20/20 7591 10/20/20 6384  10/20/20 0726  BP: 125/78 136/84 117/76 121/66  Pulse: (!) 106 (!) 109 (!) 115 (!) 108  Resp: (!) 22 20 (!) 22 16  Temp:      TempSrc:      SpO2: 99% 95% 97% 94%  Weight:      Height:       Physical Exam Constitutional:      General: She is not in acute distress.    Appearance: She is not diaphoretic.  HENT:     Head: Normocephalic and atraumatic.     Nose: Nose normal.     Mouth/Throat:     Pharynx: No oropharyngeal exudate.  Eyes:     General: No scleral icterus.    Pupils: Pupils are equal, round, and reactive to light.  Cardiovascular:     Rate and Rhythm: Normal rate and regular rhythm.     Heart sounds: No murmur heard.   Pulmonary:     Effort: Pulmonary effort is normal. No respiratory distress.     Breath sounds: No rales.  Chest:     Chest wall: No tenderness.  Abdominal:     General: There is no distension.     Palpations: Abdomen is soft.     Tenderness: There is no abdominal  tenderness.  Musculoskeletal:        General: Normal range of motion.     Cervical back: Normal range of motion and neck supple.  Skin:    General: Skin is warm and dry.     Findings: No erythema.  Neurological:     Mental Status: She is alert and oriented to person, place, and time.     Cranial Nerves: No cranial nerve deficit.     Motor: No abnormal muscle tone.     Coordination: Coordination normal.  Psychiatric:        Mood and Affect: Affect normal.         CMP Latest Ref Rng & Units 10/19/2020  Glucose 70 - 99 mg/dL 138(H)  BUN 8 - 23 mg/dL 18  Creatinine 0.44 - 1.00 mg/dL 0.81  Sodium 135 - 145 mmol/L 134(L)  Potassium 3.5 - 5.1 mmol/L 3.6  Chloride 98 - 111 mmol/L 97(L)  CO2 22 - 32 mmol/L 26  Calcium 8.9 - 10.3 mg/dL 8.6(L)   CBC Latest Ref Rng & Units 10/19/2020  WBC 4.0 - 10.5 K/uL 13.5(H)  Hemoglobin 12.0 - 15.0 g/dL 12.7  Hematocrit 36 - 46 % 38.0  Platelets 150 - 400 K/uL 276    RADIOGRAPHIC STUDIES: I have personally reviewed the radiological images as listed and agreed with the findings in the report. DG Chest 2 View  Result Date: 10/05/2020 CLINICAL DATA:  59 year old female with history of COPD exacerbation. Possible pneumonia. EXAM: CHEST - 2 VIEW COMPARISON:  No priors. FINDINGS: Multiple pulmonary nodules are noted in the lungs bilaterally, concerning for metastatic disease. Opacity in the right base which may reflect atelectasis and/or consolidation as well as superimposed moderate to large right pleural effusion. No left pleural effusion. No evidence of pulmonary edema. Heart size is normal. Upper mediastinal contours are within normal limits. IMPRESSION: 1. Findings are highly concerning for widespread metastatic disease to the chest, likely with a malignant right pleural effusion. Further evaluation with contrast enhanced chest CT is strongly recommended in the immediate future to better evaluate these findings. These results will be called to the  ordering clinician or representative by the Radiologist Assistant, and communication documented in the PACS or [employer].  Electronically Signed   By: Vinnie Langton M.D.   On: 10/05/2020 16:58   CT CHEST W CONTRAST  Result Date: 10/18/2020 CLINICAL DATA:  Malignant pleural effusion. Recently diagnosed with COPD. Abnormal chest radiograph. EXAM: CT CHEST WITH CONTRAST TECHNIQUE: Multidetector CT imaging of the chest was performed during intravenous contrast administration. CONTRAST:  57mL ISOVUE-300 IOPAMIDOL (ISOVUE-300) INJECTION 61% COMPARISON:  10/05/2020 chest radiograph. FINDINGS: Cardiovascular: Advanced aortic and branch vessel atherosclerosis. Tortuous thoracic aorta. Normal heart size with minimal pericardial fluid. Left upper lobe pulmonary vein is surrounded and narrowed by adenopathy on 70/2. Pulmonary artery branches are also narrowed by adenopathy and the central right upper lobe lung mass. No pulmonary artery filling defect. SVC narrowing including on 60/2 and coronal image 62. Mediastinum/Nodes: Right supraclavicular/low jugular 1.5 cm node on 09/02. Massive thoracic adenopathy. Pretracheal mass/adenopathy measures 3.7 cm on 55/2. Subcarinal adenopathy measures 2.1 cm on 75/2. Extensive left hilar and infrahilar adenopathy, including a nodal mass of 1.6 x 3.7 cm on 70/2. Prevascular adenopathy at 1.7 cm on 64/2. Juxta diaphragmatic left-sided nodes or nodules including at 9 mm on 147/2. Lungs/Pleura: Moderate to large right pleural effusion. Extensive irregular soft tissue thickening within the right pleural space, including on 83/2, consistent with pleural metastasis. Anterior right pleural implant measures 5.9 cm maximally on 122/2. Mild tracheal deviation to the right. Right upper lobe bronchial obstruction on 62/3. Right middle lobe endobronchial obstruction 80/3. Significant mass-effect upon right lower lobe bronchi with near complete obstruction. A central right upper lobe lung  mass with direct mediastinal invasion is most apparent on soft tissue windows, including at 7.1 x 6.4 cm on 57/2. A combination of postobstructive and compressive atelectasis involves the majority of the remainder of the right lung. Extensive bilateral pulmonary nodules/metastasis. Index left upper lobe 1.0 cm nodule on 103/3. Index left lower lobe pulmonary nodule measures 1.0 cm on 111/3. Right lower lobe lung nodule of 11 mm on 110/3. Upper Abdomen: Motion degradation in the upper abdomen. Normal imaged portions of the liver, spleen, pancreas, gallbladder. Left greater than right, relatively mild adrenal thickening and nodularity. Proximal gastric underdistention. Upper pole 1.3 cm left renal cyst. Interpolar right renal too small to characterize lesion. Abdominal aortic atherosclerosis. Musculoskeletal: Multiple sclerotic foci, including within the sternal manubrium on sagittal image 97. Lytic lesion within the T2 vertebral body measures 11 mm on sagittal image 99. IMPRESSION: 1. 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 2. Metastatic disease to low cervical/thoracic nodes, lungs, right pleural space, and bones. 3. Moderate to large right pleural effusion. 4. SVC narrowing, suggesting impending SVC syndrome. Consider relatively urgent multidisciplinary thoracic oncology consultation. These results will be called to the ordering clinician or representative by the Radiologist Assistant, and communication documented in the PACS or [employer]. Electronically Signed   By: Abigail Miyamoto M.D.   On: 10/18/2020 16:42   DG Chest Port 1 View  Result Date: 10/20/2020 CLINICAL DATA:  Shortness of breath EXAM: PORTABLE CHEST 1 VIEW COMPARISON:  10/04/2020.  Chest CT from 2 days ago FINDINGS: Large right pleural effusion obscuring most of the right lung. Reticulonodular opacity on the left due to multiple pulmonary nodules. Normal heart size. Thoracic adenopathy by CT.  IMPRESSION: Extensive intrathoracic malignancy by recent chest CT. Very large right pleural effusion. Electronically Signed   By: Monte Fantasia M.D.   On: 10/20/2020 04:41    Assessment and plan- Patient is a 59 y.o. female who is a current everyday smoker, COPD  who presents to emergency room for evaluation of worsening of dyspnea and weakness.  CT findings are concerning for metastatic primary lung neoplasm  #Right upper lobe mass with direct invasion into mediastinum, as well as extensive cervical/thoracic lymphadenopathy, Bilateral lung nodules,bone lesions, right pleural effusion, SVC narrowing Status post right thoracentesis, fluid study as well as cytology are pending. CT images were independently viewed by me and discussed with patient.  Highly suspicious for primary lung neoplasm with metastatic disease.  Awaiting cytology to confirm tissue diagnosis.  I discussed with patient about the possibility of additional biopsy if pleural effusion study is not conclusive or if current specimen is not enough for additional molecular studies. Recommend MRI brain with and without contrast for staging  Outpatient PET scan to complete staging. Check LFT, LDH   SVC narrowing, she has no significant facial swelling.  I recommend to consult vascular surgeon for further evaluation. Patient will most likely need palliative radiation due to the right middle lobe endobronchial obstruction as well as SVC compression.  Plan was discussed with Dr. Posey Pronto. Thank you for allowing me to participate in the care of this patient.    [name], MD, PhD Hematology Oncology [facility] at [facility] Pager- [phone] 10/20/2020

## 2020-10-20 NOTE — ED Provider Notes (Signed)
[facility] Emergency Department Provider Note   ____________________________________________   First MD Initiated Contact with Patient 10/20/20 0411     (approximate)  I have reviewed the triage vital signs and the nursing notes.   HISTORY  Chief Complaint Dizziness and Shortness of Breath    HPI [name] is a 59 y.o. female brought to the ED via EMS from home with a chief complaint of generalized weakness, dizziness, chest pain and shortness of breath.  Patient is a heavy smoker who was recently diagnosed with COPD approximately 1 month ago by her PCP.  Had a CT scan of her chest performed yesterday which demonstrates extensive malignancy and moderate to right sided pleural effusion.  She is being set up with oncology follow-up.  Patient complains of chills, right lower chest pain, dyspnea on exertion, shortness of breath, cough, dizziness and generalized weakness.  Denies abdominal pain, nausea, vomiting or diarrhea.  Denies recent travel trauma.  Has been vaccinated against COVID-19.     Past Medical History:  Diagnosis Date  . GERD (gastroesophageal reflux disease)     Patient Active Problem List   Diagnosis Date Noted  . Pleural effusion on right 10/20/2020    Past Surgical History:  Procedure Laterality Date  . COLONOSCOPY WITH ESOPHAGOGASTRODUODENOSCOPY (EGD)    . COLONOSCOPY WITH PROPOFOL N/A 11/14/2015   Procedure: COLONOSCOPY WITH PROPOFOL;  Surgeon: Hulen Luster, MD;  Location: [facility] ENDOSCOPY;  Service: Gastroenterology;  Laterality: N/A;  . ESOPHAGOGASTRODUODENOSCOPY (EGD) WITH PROPOFOL N/A 11/14/2015   Procedure: ESOPHAGOGASTRODUODENOSCOPY (EGD) WITH PROPOFOL;  Surgeon: Hulen Luster, MD;  Location: [facility] ENDOSCOPY;  Service: Gastroenterology;  Laterality: N/A;    Prior to Admission medications   Medication Sig Start Date End Date Taking? Authorizing Provider  acetaminophen (TYLENOL) 500 MG tablet Take 1,000 mg by mouth every 8  (eight) hours as needed for moderate pain.   Yes [provider]  omeprazole (PRILOSEC) 20 MG capsule Take 20 mg by mouth daily.   Yes [provider]  tiotropium (SPIRIVA HANDIHALER) 18 MCG inhalation capsule Place 18 mcg into inhaler and inhale daily.   Yes [provider]    Allergies Patient has no known allergies.  Family History  Problem Relation Age of Onset  . Breast cancer Cousin 4  . Diabetes type II Mother   . Hypertension Mother   . Colon cancer Father   . Hypertension Father     Social History Social History   Tobacco Use  . Smoking status: Current Every Day Smoker    Packs/day: 0.20    Types: Cigarettes  . Smokeless tobacco: Never Used  Substance Use Topics  . Alcohol use: Never  . Drug use: Never    Review of Systems  Constitutional: Positive for generalized weakness, unintentional weight loss and chills Eyes: No visual changes. ENT: No sore throat. Cardiovascular: Positive for chest pain. Respiratory: Positive for shortness of breath. Gastrointestinal: No abdominal pain.  No nausea, no vomiting.  No diarrhea.  No constipation. Genitourinary: Negative for dysuria. Musculoskeletal: Negative for back pain. Skin: Negative for rash. Neurological: Negative for headaches, focal weakness or numbness.   ____________________________________________   PHYSICAL EXAM:  VITAL SIGNS: ED Triage Vitals  Enc Vitals Group     BP 10/19/20 2237 (!) 118/49     Pulse Rate 10/19/20 2237 (!) 116     Resp 10/19/20 2237 (!) 22     Temp 10/19/20 2237 98.7 F (37.1 C)     Temp  Source 10/19/20 2237 Oral     SpO2 10/19/20 2237 95 %     Weight 10/19/20 2238 151 lb (68.5 kg)     Height 10/19/20 2238 5\' 11"  (1.803 m)     Head Circumference --      Peak Flow --      Pain Score 10/19/20 2237 0     Pain Loc --      Pain Edu? --      Excl. in [place]? --     Constitutional: Alert and oriented.  Cachectic appearing and in mild acute  distress. Eyes: Conjunctivae are normal. PERRL. EOMI. Head: Atraumatic. Nose: No congestion/rhinnorhea. Mouth/Throat: Mucous membranes are mildly dry.   Neck: No stridor.   Cardiovascular: Tachycardic rate, regular rhythm. Grossly normal heart sounds.  Good peripheral circulation. Respiratory: Increased respiratory effort.  No retractions. Lungs diminished bilaterally. Gastrointestinal: Soft and nontender. No distention. No abdominal bruits. No CVA tenderness. Musculoskeletal: No lower extremity tenderness nor edema.  No joint effusions. Neurologic:  Normal speech and language. No gross focal neurologic deficits are appreciated.  Skin:  Skin is warm, dry and intact. No rash noted. Psychiatric: Mood and affect are normal. Speech and behavior are normal.  ____________________________________________   LABS (all labs ordered are listed, but only abnormal results are displayed)  Labs Reviewed  BASIC METABOLIC PANEL - Abnormal; Notable for the following components:      Result Value   Sodium 134 (*)    Chloride 97 (*)    Glucose, Bld 138 (*)    Calcium 8.6 (*)    All other components within normal limits  CBC - Abnormal; Notable for the following components:   WBC 13.5 (*)    All other components within normal limits  URINALYSIS, COMPLETE (UACMP) WITH MICROSCOPIC - Abnormal; Notable for the following components:   Color, Urine YELLOW (*)    APPearance HAZY (*)    Ketones, ur 20 (*)    All other components within normal limits  RESPIRATORY PANEL BY RT PCR (FLU A&B, COVID)  CULTURE, BLOOD (ROUTINE X 2)  CULTURE, BLOOD (ROUTINE X 2)  LACTIC ACID, PLASMA  LACTIC ACID, PLASMA  HIV ANTIBODY (ROUTINE TESTING W REFLEX)  TROPONIN I (HIGH SENSITIVITY)  TROPONIN I (HIGH SENSITIVITY)   ____________________________________________  EKG  ED ECG REPORT I, Chenelle Benning J, the attending physician, personally viewed and interpreted this ECG.   Date: 10/20/2020  EKG Time: 2235  Rate: 115   Rhythm: sinus tachycardia  Axis: Normal  Intervals:none  ST&T Change: Nonspecific  ____________________________________________  RADIOLOGY I, Saja Bartolini J, personally viewed and evaluated these images (plain radiographs) as part of my medical decision making, as well as reviewing the written report by the radiologist.  ED MD interpretation: Large right pleural effusion  Official radiology report(s): DG Chest Port 1 View  Result Date: 10/20/2020 CLINICAL DATA:  Shortness of breath EXAM: PORTABLE CHEST 1 VIEW COMPARISON:  10/04/2020.  Chest CT from 2 days ago FINDINGS: Large right pleural effusion obscuring most of the right lung. Reticulonodular opacity on the left due to multiple pulmonary nodules. Normal heart size. Thoracic adenopathy by CT. IMPRESSION: Extensive intrathoracic malignancy by recent chest CT. Very large right pleural effusion. Electronically Signed   By: Monte Fantasia M.D.   On: 10/20/2020 04:41    ____________________________________________   PROCEDURES  Procedure(s) performed (including Critical Care):  .1-3 Lead EKG Interpretation Performed by: Paulette Blanch, MD Authorized by: Paulette Blanch, MD     Interpretation: abnormal  ECG rate:  110   ECG rate assessment: tachycardic     Rhythm: sinus tachycardia     Ectopy: none     Conduction: normal   Comments:     Patient placed on cardiac monitor to evaluate for arrhythmias     ____________________________________________   INITIAL IMPRESSION / ASSESSMENT AND PLAN / ED COURSE  As part of my medical decision making, I reviewed the following data within the electronic MEDICAL RECORD NUMBER History obtained from family, Nursing notes reviewed and incorporated, Labs reviewed, EKG interpreted, Old chart reviewed (10/04/2020 x-ray, 10/18/2020 CT chest), Radiograph reviewed, Discussed with admitting physician and Notes from prior ED visits     59 year old female with recent unintentional weight loss, diagnosis of  COPD and CT chest concerning for metastatic disease with moderate to right-sided pleural effusion presenting for shortness of breath. Differential includes, but is not limited to, viral syndrome, bronchitis including COPD exacerbation, pneumonia, reactive airway disease including asthma, CHF including exacerbation with or without pulmonary/interstitial edema, pneumothorax, ACS, thoracic trauma, and pulmonary embolism.  Laboratory results demonstrate mild leukocytosis, ketonuria.  Will perform ambulation trial.  Initiate IV fluid resuscitation, check blood cultures, lactic acid, respiratory panel.  Administer IV Solu-Medrol, DuoNeb and reassess.   Clinical Course as of Oct 21 611  Thu Oct 20, 2020  0453 On brief ambulation trial, room air saturations dipped to 93% while heart rate increased to 150s.  Will discuss case with hospitalist services for admission.   [JS]    Clinical Course User Index [JS] Paulette Blanch, MD     ____________________________________________   FINAL CLINICAL IMPRESSION(S) / ED DIAGNOSES  Final diagnoses:  Shortness of breath  Chronic obstructive pulmonary disease with acute exacerbation (HCC)  Dizziness  Weakness  Malignant pleural effusion  Dehydration     ED Discharge Orders    None      *Please note:  Brittney Fisher was evaluated in Emergency Department on 10/20/2020 for the symptoms described in the history of present illness. She was evaluated in the context of the global COVID-19 pandemic, which necessitated consideration that the patient might be at risk for infection with the SARS-CoV-2 virus that causes COVID-19. Institutional protocols and algorithms that pertain to the evaluation of patients at risk for COVID-19 are in a state of rapid change based on information released by regulatory bodies including the CDC and federal and state organizations. These policies and algorithms were followed during the patient's care in the ED.  Some ED evaluations  and interventions may be delayed as a result of limited staffing during and the pandemic.*   Note:  This document was prepared using Dragon voice recognition software and may include unintentional dictation errors.   Paulette Blanch, MD 10/20/20 [phone]

## 2020-10-20 NOTE — Procedures (Signed)
Pre procedural Dx: Malignant right sided pleural effusion Post procedural Dx: Same  Successful US guided right sided thoracentesis yielding 2.4 L of serous pleural fluid.   Samples sent to lab for analysis.  EBL: None Complications: None immediate.  Ronny Bacon, MD Pager #: [phone]

## 2020-10-20 NOTE — Progress Notes (Signed)
[facility] NAME: Brittney Fisher    MR#:  [id number]  DATE OF BIRTH:  May 02, 1961  SUBJECTIVE:   Came in with increasing shortness of breath and weight loss with dizziness for several weeks to months. Feels a lot better after removal of fluid from the lungs. Niece in the room. REVIEW OF SYSTEMS:   Review of Systems  Constitutional: Positive for malaise/fatigue and weight loss. Negative for chills and fever.  HENT: Negative for ear discharge, ear pain and nosebleeds.   Eyes: Negative for blurred vision, pain and discharge.  Respiratory: Positive for cough, sputum production and shortness of breath. Negative for wheezing and stridor.   Cardiovascular: Negative for chest pain, palpitations, orthopnea and PND.  Gastrointestinal: Negative for abdominal pain, diarrhea, nausea and vomiting.  Genitourinary: Negative for frequency and urgency.  Musculoskeletal: Negative for back pain and joint pain.  Neurological: Positive for weakness. Negative for sensory change, speech change and focal weakness.  Psychiatric/Behavioral: Negative for depression and hallucinations. The patient is not nervous/anxious.    Tolerating Diet:yes Tolerating PT:   DRUG ALLERGIES:  No Known Allergies  VITALS:  Blood pressure 115/71, pulse (!) 114, temperature 98.7 F (37.1 C), temperature source Oral, resp. rate 20, height 5\' 11"  (1.803 m), weight 68.5 kg, SpO2 98 %.  PHYSICAL EXAMINATION:   Physical Exam  GENERAL:  59 y.o.-year-old patient lying in the bed with no acute distress.  HEENT: Head atraumatic, normocephalic. Oropharynx and nasopharynx clear.  NECK:  Supple, no jugular venous distention. No thyroid enlargement, no tenderness.  LUNGS: decreased breath sounds bilaterally, no wheezing, rales, scattered rhonchi. No use of accessory muscles of respiration.  CARDIOVASCULAR: S1, S2 normal. No murmurs, rubs, or gallops.  ABDOMEN: Soft, nontender,  nondistended. Bowel sounds present. No organomegaly or mass.  EXTREMITIES: No cyanosis, clubbing or edema b/l.    NEUROLOGIC: Cranial nerves II through XII are intact. No focal Motor or sensory deficits b/l.   PSYCHIATRIC:  patient is alert and oriented x 3.  SKIN: No obvious rash, lesion, or ulcer.   LABORATORY PANEL:  CBC Recent Labs  Lab 10/19/20 2244  WBC 13.5*  HGB 12.7  HCT 38.0  PLT 276    Chemistries  Recent Labs  Lab 10/19/20 2244  NA 134*  K 3.6  CL 97*  CO2 26  GLUCOSE 138*  BUN 18  CREATININE 0.81  CALCIUM 8.6*   Cardiac Enzymes No results for input(s): TROPONINI in the last 168 hours. RADIOLOGY:  CT CHEST W CONTRAST  Result Date: 10/18/2020 CLINICAL DATA:  Malignant pleural effusion. Recently diagnosed with COPD. Abnormal chest radiograph. EXAM: CT CHEST WITH CONTRAST TECHNIQUE: Multidetector CT imaging of the chest was performed during intravenous contrast administration. CONTRAST:  26mL ISOVUE-300 IOPAMIDOL (ISOVUE-300) INJECTION 61% COMPARISON:  10/05/2020 chest radiograph. FINDINGS: Cardiovascular: Advanced aortic and branch vessel atherosclerosis. Tortuous thoracic aorta. Normal heart size with minimal pericardial fluid. Left upper lobe pulmonary vein is surrounded and narrowed by adenopathy on 70/2. Pulmonary artery branches are also narrowed by adenopathy and the central right upper lobe lung mass. No pulmonary artery filling defect. SVC narrowing including on 60/2 and coronal image 62. Mediastinum/Nodes: Right supraclavicular/low jugular 1.5 cm node on 09/02. Massive thoracic adenopathy. Pretracheal mass/adenopathy measures 3.7 cm on 55/2. Subcarinal adenopathy measures 2.1 cm on 75/2. Extensive left hilar and infrahilar adenopathy, including a nodal mass of 1.6 x 3.7 cm on 70/2. Prevascular adenopathy at 1.7 cm on 64/2. Juxta diaphragmatic left-sided nodes  or nodules including at 9 mm on 147/2. Lungs/Pleura: Moderate to large right pleural effusion. Extensive  irregular soft tissue thickening within the right pleural space, including on 83/2, consistent with pleural metastasis. Anterior right pleural implant measures 5.9 cm maximally on 122/2. Mild tracheal deviation to the right. Right upper lobe bronchial obstruction on 62/3. Right middle lobe endobronchial obstruction 80/3. Significant mass-effect upon right lower lobe bronchi with near complete obstruction. A central right upper lobe lung mass with direct mediastinal invasion is most apparent on soft tissue windows, including at 7.1 x 6.4 cm on 57/2. A combination of postobstructive and compressive atelectasis involves the majority of the remainder of the right lung. Extensive bilateral pulmonary nodules/metastasis. Index left upper lobe 1.0 cm nodule on 103/3. Index left lower lobe pulmonary nodule measures 1.0 cm on 111/3. Right lower lobe lung nodule of 11 mm on 110/3. Upper Abdomen: Motion degradation in the upper abdomen. Normal imaged portions of the liver, spleen, pancreas, gallbladder. Left greater than right, relatively mild adrenal thickening and nodularity. Proximal gastric underdistention. Upper pole 1.3 cm left renal cyst. Interpolar right renal too small to characterize lesion. Abdominal aortic atherosclerosis. Musculoskeletal: Multiple sclerotic foci, including within the sternal manubrium on sagittal image 97. Lytic lesion within the T2 vertebral body measures 11 mm on sagittal image 99. IMPRESSION: 1. 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 2. Metastatic disease to low cervical/thoracic nodes, lungs, right pleural space, and bones. 3. Moderate to large right pleural effusion. 4. SVC narrowing, suggesting impending SVC syndrome. Consider relatively urgent multidisciplinary thoracic oncology consultation. These results will be called to the ordering clinician or representative by the Radiologist Assistant, and communication documented in the PACS or  [employer]. Electronically Signed   By: Abigail Miyamoto M.D.   On: 10/18/2020 16:42   DG Chest Port 1 View  Result Date: 10/20/2020 CLINICAL DATA:  No known primary, now with concern for malignant right-sided pleural effusion post thoracentesis. EXAM: PORTABLE CHEST 1 VIEW COMPARISON:  Chest radiograph-earlier same day; chest CT-10/18/2020 FINDINGS: Interval reduction in persistent trace right-sided effusion post thoracentesis. No pneumothorax. Improved aeration of the right mid and lower lung with persistent right basilar consolidative opacities, likely atelectasis. Redemonstrated extensive bilateral nodular opacities with obscuration of the right heart border secondary to persistent atelectasis/collapse of the right upper lobe. No left-sided pleural effusion or pneumothorax. No evidence of edema. No acute osseous abnormalities. IMPRESSION: 1. Interval reduction in persistent trace right-sided effusion post thoracentesis. No pneumothorax. 2. Improved aeration of the right mid and lower lung with persistent right basilar opacities, likely atelectasis. 3. Similar findings of right upper lobe collapse and extensive metastatic disease involving the lungs bilaterally as better demonstrated recent chest CT. Electronically Signed   By: Sandi Mariscal M.D.   On: 10/20/2020 12:17   DG Chest Port 1 View  Result Date: 10/20/2020 CLINICAL DATA:  Shortness of breath EXAM: PORTABLE CHEST 1 VIEW COMPARISON:  10/04/2020.  Chest CT from 2 days ago FINDINGS: Large right pleural effusion obscuring most of the right lung. Reticulonodular opacity on the left due to multiple pulmonary nodules. Normal heart size. Thoracic adenopathy by CT. IMPRESSION: Extensive intrathoracic malignancy by recent chest CT. Very large right pleural effusion. Electronically Signed   By: Monte Fantasia M.D.   On: 10/20/2020 04:41   US THORACENTESIS ASP PLEURAL SPACE W/IMG GUIDE  Result Date: 10/20/2020 INDICATION: No known primary, now with  concern for malignant right-sided pleural effusion. Please perform ultrasound-guided thoracentesis for diagnostic  and therapeutic purposes. EXAM: US THORACENTESIS ASP PLEURAL SPACE W/IMG GUIDE COMPARISON:  Chest CT-10/18/2020 MEDICATIONS: None. COMPLICATIONS: None immediate. TECHNIQUE: Informed written consent was obtained from the patient after a discussion of the risks, benefits and alternatives to treatment. A timeout was performed prior to the initiation of the procedure. Initial ultrasound scanning demonstrates a large complex right-sided pleural effusion with large amount floating echogenic debris within the pleural fluid but without definitive loculations. The lower chest was prepped and draped in the usual sterile fashion. 1% lidocaine was used for local anesthesia. An ultrasound image was saved for documentation purposes. An 8 Fr Safe-T-Centesis catheter was introduced. The thoracentesis was performed. The catheter was removed and a dressing was applied. The patient tolerated the procedure well without immediate post procedural complication. The patient was escorted to have an upright chest radiograph. FINDINGS: A total of approximately 2.4 liters of serous fluid was removed. Requested samples were sent to the laboratory. IMPRESSION: Successful ultrasound-guided right sided thoracentesis yielding 2.4 liters of pleural fluid. Electronically Signed   By: Sandi Mariscal M.D.   On: 10/20/2020 12:15   ASSESSMENT AND PLAN:   Brittney Fisher is a 59 y.o. female with PMH of GERD and tobacco abuse brought to the ED via EMS from home with a chief complaint of generalized weakness, dizziness, chest pain and shortness of breath.  Patient is a heavy smoker who was recently diagnosed with COPD approximately 1 month ago by her PCP. Patient complains of significant weight loss of the last several months.  Right-sided pleural effusion-malignant suspected metastatic cancer likely Lung significant weight loss -status  post thoracentesis with removal of 2.4 L of fluid. Labs pending -patient seen by Dr. [name] oncology. Patient will need further workup for staging and possible biopsy if fluid cytology is inclusive.  SVC syndrome findings noted on CT scan -clinically patient does not have any major symptoms -she has hoarse voice and shortness of breath -vascular surgery consultation placed to review of CT scan  COPD exacerbation acute bronchitis ongoing tobacco abuse-- patient says she quit -continue oxygen. Wean to room air. If not assess for home oxygen you -continue bronchodilators. -Will give a course of prednisone taper. -Empiric Levaquin for five days  Gerd continue PPI  DVT prophylaxis subcu Lovenox Procedures: Family communication : niece Gretchen Short in the ER Consults : oncology, vascular surgery CODE STATUS: full code per patient DVT Prophylaxis : Lovenox  Status is: Inpatient  Remains inpatient appropriate because:Inpatient level of care appropriate due to severity of illness   Dispo: The patient is from: Home              Anticipated d/c is to: Home              Anticipated d/c date is: 1 day              Patient currently is not medically stable to d/c. new diagnosis of malignant pleural effusion with metastatic cancer. Oncology consultation and vascular consultation placed.       TOTAL TIME TAKING CARE OF THIS PATIENT: 25 minutes.  >50% time spent on counselling and coordination of care  Note: This dictation was prepared with Dragon dictation along with smaller phrase technology. Any transcriptional errors that result from this process are unintentional.  Fritzi Mandes M.D    [facility]   CC: Primary care physician; Ranae Plumber, PAPatient ID: Devonne Doughty, female   DOB: 1961-06-08, 59 y.o.   MRN: [id number]

## 2020-10-20 NOTE — Progress Notes (Signed)
Pt currently in ultrasound for thoracentesis

## 2020-10-20 NOTE — ED Notes (Signed)
Pt sats 93% while ambulating. HR to 150s

## 2020-10-20 NOTE — H&P (Addendum)
[place]   PATIENT NAME: Brittney Fisher    MR#:  [id number]  DATE OF BIRTH:  [birth year]  DATE OF ADMISSION:  10/20/2020  PRIMARY CARE PHYSICIAN: Ranae Plumber, PA   REQUESTING/REFERRING PHYSICIAN: Lurline Hare, MD  CHIEF COMPLAINT:   Chief Complaint  Patient presents with  . Dizziness  . Shortness of Breath    HISTORY OF PRESENT ILLNESS:  Brittney Fisher  is a 59 y.o. African-American female with a known history of GERD, ongoing tobacco abuse and COPD and recently diagnosed metastatic lung cancer by chest CT that was done yesterday.  She has been following with her primary care physician has been losing weight unintentionally.  She lost 30 to 40 pounds in the last 6 months.  She presents today with worsening dyspnea with associated chest pain as well as generalized weakness.  She has been experiencing palpitations with movement.  She admits to chills without fever.  She has been having cough productive of thick whitish sputum and wheezing.  No nausea or vomiting or abdominal pain.  No dysuria, oliguria or hematuria or flank pain.  Upon presentation to the emergency room heart rate was 116 and respiratory rate 22 with otherwise normal vital signs.  The patient's heart rate went up to 150 upon ambulation without dropping of her pulse oximetry.  Labs revealed mild hyponatremia hypochloremia.  CBC showed leukocytosis and urinalysis showed 20 ketones and was otherwise unremarkable.  The patient chest CT scan on 11 2 revealed the following: 1. 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 2. Metastatic disease to low cervical/thoracic nodes, lungs, right pleural space, and bones. 3. Moderate to large right pleural effusion. 4. SVC narrowing, suggesting impending SVC syndrome. Consider relatively urgent multidisciplinary thoracic oncology consultation.  2 view chest x-ray today revealed findings that are highly concerning for widespread  metastatic disease to the chest likely with a malignant right pleural effusion.  The patient was given DuoNeb as well as IV Solu-Medrol and 1 L bolus of IV normal saline.  She will be admitted to a medical monitored bed for further evaluation and management.  PAST MEDICAL HISTORY:   Past Medical History:  Diagnosis Date  . GERD (gastroesophageal reflux disease)     PAST SURGICAL HISTORY:   Past Surgical History:  Procedure Laterality Date  . COLONOSCOPY WITH ESOPHAGOGASTRODUODENOSCOPY (EGD)    . COLONOSCOPY WITH PROPOFOL N/A 11/14/2015   Procedure: COLONOSCOPY WITH PROPOFOL;  Surgeon: Hulen Luster, MD;  Location: [facility] ENDOSCOPY;  Service: Gastroenterology;  Laterality: N/A;  . ESOPHAGOGASTRODUODENOSCOPY (EGD) WITH PROPOFOL N/A 11/14/2015   Procedure: ESOPHAGOGASTRODUODENOSCOPY (EGD) WITH PROPOFOL;  Surgeon: Hulen Luster, MD;  Location: [facility] ENDOSCOPY;  Service: Gastroenterology;  Laterality: N/A;    SOCIAL HISTORY:   Social History   Tobacco Use  . Smoking status: Current Every Day Smoker    Packs/day: 0.20    Types: Cigarettes  . Smokeless tobacco: Never Used  Substance Use Topics  . Alcohol use: Never    FAMILY HISTORY:   Family History  Problem Relation Age of Onset  . Breast cancer Cousin 43  . Diabetes type II Mother   . Hypertension Mother   . Colon cancer Father   . Hypertension Father     DRUG ALLERGIES:  No Known Allergies  REVIEW OF SYSTEMS:   ROS As per history of present illness. All pertinent systems were reviewed above. Constitutional, HEENT, cardiovascular, respiratory, GI, GU, musculoskeletal, neuro, psychiatric, endocrine, integumentary and hematologic  systems were reviewed and are otherwise negative/unremarkable except for positive findings mentioned above in the HPI.   MEDICATIONS AT HOME:   Prior to Admission medications   Medication Sig Start Date End Date Taking? Authorizing Provider  omeprazole (PRILOSEC) 20 MG capsule Take 20 mg by mouth  daily.    [provider]      VITAL SIGNS:  Blood pressure 129/79, pulse (!) 111, temperature 98 F (36.7 C), resp. rate (!) 21, height 5\' 11"  (1.803 m), weight 68.5 kg, SpO2 95 %.  PHYSICAL EXAMINATION:  Physical Exam  GENERAL:  59 y.o.-year-old African-American patient lying in the bed with mild respiratory distress with conversational dyspnea. EYES: Pupils equal, round, reactive to light and accommodation. No scleral icterus. Extraocular muscles intact.  HEENT: Head atraumatic, normocephalic. Oropharynx and nasopharynx clear.  NECK:  Supple, no jugular venous distention. No thyroid enlargement, no tenderness.  LUNGS: Significantly diminished right basal and midlung zone breath sounds.  Diminished expiratory airflow with harsh vesicular breathing. CARDIOVASCULAR: Regular rate and rhythm, S1, S2 normal. No murmurs, rubs, or gallops.  ABDOMEN: Soft, nondistended, nontender. Bowel sounds present. No organomegaly or mass.  EXTREMITIES: No pedal edema, cyanosis, or clubbing.  NEUROLOGIC: Cranial nerves II through XII are intact. Muscle strength 5/5 in all extremities. Sensation intact. Gait not checked.  PSYCHIATRIC: The patient is alert and oriented x 3.  Normal affect and good eye contact. SKIN: No obvious rash, lesion, or ulcer.   LABORATORY PANEL:   CBC Recent Labs  Lab 10/19/20 2244  WBC 13.5*  HGB 12.7  HCT 38.0  PLT 276   ------------------------------------------------------------------------------------------------------------------  Chemistries  Recent Labs  Lab 10/19/20 2244  NA 134*  K 3.6  CL 97*  CO2 26  GLUCOSE 138*  BUN 18  CREATININE 0.81  CALCIUM 8.6*   ------------------------------------------------------------------------------------------------------------------  Cardiac Enzymes No results for input(s): TROPONINI in the last 168  hours. ------------------------------------------------------------------------------------------------------------------  RADIOLOGY:  CT CHEST W CONTRAST  Result Date: 10/18/2020 CLINICAL DATA:  Malignant pleural effusion. Recently diagnosed with COPD. Abnormal chest radiograph. EXAM: CT CHEST WITH CONTRAST TECHNIQUE: Multidetector CT imaging of the chest was performed during intravenous contrast administration. CONTRAST:  71mL ISOVUE-300 IOPAMIDOL (ISOVUE-300) INJECTION 61% COMPARISON:  10/05/2020 chest radiograph. FINDINGS: Cardiovascular: Advanced aortic and branch vessel atherosclerosis. Tortuous thoracic aorta. Normal heart size with minimal pericardial fluid. Left upper lobe pulmonary vein is surrounded and narrowed by adenopathy on 70/2. Pulmonary artery branches are also narrowed by adenopathy and the central right upper lobe lung mass. No pulmonary artery filling defect. SVC narrowing including on 60/2 and coronal image 62. Mediastinum/Nodes: Right supraclavicular/low jugular 1.5 cm node on 09/02. Massive thoracic adenopathy. Pretracheal mass/adenopathy measures 3.7 cm on 55/2. Subcarinal adenopathy measures 2.1 cm on 75/2. Extensive left hilar and infrahilar adenopathy, including a nodal mass of 1.6 x 3.7 cm on 70/2. Prevascular adenopathy at 1.7 cm on 64/2. Juxta diaphragmatic left-sided nodes or nodules including at 9 mm on 147/2. Lungs/Pleura: Moderate to large right pleural effusion. Extensive irregular soft tissue thickening within the right pleural space, including on 83/2, consistent with pleural metastasis. Anterior right pleural implant measures 5.9 cm maximally on 122/2. Mild tracheal deviation to the right. Right upper lobe bronchial obstruction on 62/3. Right middle lobe endobronchial obstruction 80/3. Significant mass-effect upon right lower lobe bronchi with near complete obstruction. A central right upper lobe lung mass with direct mediastinal invasion is most apparent on soft tissue  windows, including at 7.1 x 6.4 cm on 57/2. A combination of postobstructive  and compressive atelectasis involves the majority of the remainder of the right lung. Extensive bilateral pulmonary nodules/metastasis. Index left upper lobe 1.0 cm nodule on 103/3. Index left lower lobe pulmonary nodule measures 1.0 cm on 111/3. Right lower lobe lung nodule of 11 mm on 110/3. Upper Abdomen: Motion degradation in the upper abdomen. Normal imaged portions of the liver, spleen, pancreas, gallbladder. Left greater than right, relatively mild adrenal thickening and nodularity. Proximal gastric underdistention. Upper pole 1.3 cm left renal cyst. Interpolar right renal too small to characterize lesion. Abdominal aortic atherosclerosis. Musculoskeletal: Multiple sclerotic foci, including within the sternal manubrium on sagittal image 97. Lytic lesion within the T2 vertebral body measures 11 mm on sagittal image 99. IMPRESSION: 1. 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 2. Metastatic disease to low cervical/thoracic nodes, lungs, right pleural space, and bones. 3. Moderate to large right pleural effusion. 4. SVC narrowing, suggesting impending SVC syndrome. Consider relatively urgent multidisciplinary thoracic oncology consultation. These results will be called to the ordering clinician or representative by the Radiologist Assistant, and communication documented in the PACS or [employer]. Electronically Signed   By: Abigail Miyamoto M.D.   On: 10/18/2020 16:42   DG Chest Port 1 View  Result Date: 10/20/2020 CLINICAL DATA:  Shortness of breath EXAM: PORTABLE CHEST 1 VIEW COMPARISON:  10/04/2020.  Chest CT from 2 days ago FINDINGS: Large right pleural effusion obscuring most of the right lung. Reticulonodular opacity on the left due to multiple pulmonary nodules. Normal heart size. Thoracic adenopathy by CT. IMPRESSION: Extensive intrathoracic malignancy by recent chest CT. Very  large right pleural effusion. Electronically Signed   By: Monte Fantasia M.D.   On: 10/20/2020 04:41      IMPRESSION AND PLAN:   1.  Symptomatic right sided pleural effusion. -The patient will be admitted to a medical monitored bed. -Interventional radiology consult to be obtained for right thoracentesis. -Pending that will empirically place her on IV Rocephin and Zithromax for the possibility of parapneumonic effusion that could be associated with sepsis given tachycardia and tachypnea with leukocytosis.  Blood cultures will be obtained as well as sputum culture. -The patient will be placed on DuoNeb as well as continued steroid therapy.   2.  Metastatic lung cancer. -Oncology consultation will be obtained.  3.  Mild COPD exacerbation. -The patient will be placed on bronchodilator therapy as well as steroid therapy as mentioned above. -We will continue her Spiriva.  4.  GERD. -PPI therapy will be resumed.  5.  DVT prophylaxis. -Subcutaneous Lovenox.    All the records are reviewed and case discussed with ED provider. The plan of care was discussed in details with the patient (and family). I answered all questions. The patient agreed to proceed with the above mentioned plan. Further management will depend upon hospital course.   CODE STATUS: Full code  Status is: Inpatient  Remains inpatient appropriate because:Ongoing diagnostic testing needed not appropriate for outpatient work up, Unsafe d/c plan, IV treatments appropriate due to intensity of illness or inability to take PO and Inpatient level of care appropriate due to severity of illness   Dispo: The patient is from: Home              Anticipated d/c is to: Home              Anticipated d/c date is: 2 days              Patient currently is not  medically stable to d/c.    TOTAL TIME TAKING CARE OF THIS PATIENT: 60 minutes.    Christel Mormon M.D on 10/20/2020 at 5:12 AM  [facility]   From 7 PM-7 AM,  contact night-coverage [email or web address]  CC: Primary care physician; Ranae Plumber, [place]

## 2020-10-20 NOTE — Progress Notes (Signed)
Pt continues to be sinus tach on the monitor with HR of 117 currently. Pt up to [facility] with min assistance, HR elevated to 130s and O2 bumped up to 4L for ambulation.

## 2020-10-21 ENCOUNTER — Inpatient Hospital Stay: Payer: BC Managed Care – PPO

## 2020-10-21 DIAGNOSIS — R918 Other nonspecific abnormal finding of lung field: Secondary | ICD-10-CM | POA: Diagnosis not present

## 2020-10-21 DIAGNOSIS — R0602 Shortness of breath: Secondary | ICD-10-CM | POA: Diagnosis not present

## 2020-10-21 DIAGNOSIS — Z9889 Other specified postprocedural states: Secondary | ICD-10-CM | POA: Diagnosis not present

## 2020-10-21 DIAGNOSIS — J910 Malignant pleural effusion: Secondary | ICD-10-CM

## 2020-10-21 DIAGNOSIS — J90 Pleural effusion, not elsewhere classified: Secondary | ICD-10-CM | POA: Diagnosis not present

## 2020-10-21 LAB — BASIC METABOLIC PANEL
Anion gap: 9 (ref 5–15)
BUN: 13 mg/dL (ref 8–23)
CO2: 23 mmol/L (ref 22–32)
Calcium: 8.5 mg/dL — ABNORMAL LOW (ref 8.9–10.3)
Chloride: 102 mmol/L (ref 98–111)
Creatinine, Ser: 0.76 mg/dL (ref 0.44–1.00)
GFR, Estimated: >60 mL/min (ref >60)
Glucose, Bld: 101 mg/dL — ABNORMAL HIGH (ref 70–99)
Potassium: 3.9 mmol/L (ref 3.5–5.1)
Sodium: 134 mmol/L — ABNORMAL LOW (ref 135–145)

## 2020-10-21 LAB — ACID FAST SMEAR (AFB, MYCOBACTERIA): Acid Fast Smear: NEGATIVE

## 2020-10-21 LAB — CBC
HCT: 35.3 % — ABNORMAL LOW (ref 36.0–46.0)
Hemoglobin: 11.9 g/dL — ABNORMAL LOW (ref 12.0–15.0)
MCH: 26.9 pg (ref 26.0–34.0)
MCHC: 33.7 g/dL (ref 30.0–36.0)
MCV: 79.7 fL — ABNORMAL LOW (ref 80.0–100.0)
Platelets: 242 10*3/uL (ref 150–400)
RBC: 4.43 MIL/uL (ref 3.87–5.11)
RDW: 14.4 % (ref 11.5–15.5)
WBC: 14 10*3/uL — ABNORMAL HIGH (ref 4.0–10.5)
nRBC: 0.3 % — ABNORMAL HIGH (ref 0.0–0.2)

## 2020-10-21 LAB — CYTOLOGY - NON PAP

## 2020-10-21 LAB — HEPATIC FUNCTION PANEL
ALT: 17 U/L (ref 0–44)
AST: 16 U/L (ref 15–41)
Albumin: 2.8 g/dL — ABNORMAL LOW (ref 3.5–5.0)
Alkaline Phosphatase: 62 U/L (ref 38–126)
Bilirubin, Direct: <0.1 mg/dL (ref 0.0–0.2)
Total Bilirubin: 0.6 mg/dL (ref 0.3–1.2)
Total Protein: 6.4 g/dL — ABNORMAL LOW (ref 6.5–8.1)

## 2020-10-21 LAB — HIV ANTIBODY (ROUTINE TESTING W REFLEX): HIV Screen 4th Generation wRfx: NONREACTIVE

## 2020-10-21 MED ORDER — PREDNISONE 50 MG PO TABS
50.0000 mg | ORAL_TABLET | Freq: Once | ORAL | Status: AC
  Administered 2020-10-21: 50 mg via ORAL
  Filled 2020-10-21: qty 1

## 2020-10-21 MED ORDER — IPRATROPIUM-ALBUTEROL 0.5-2.5 (3) MG/3ML IN SOLN
3.0000 mL | Freq: Three times a day (TID) | RESPIRATORY_TRACT | Status: DC
  Administered 2020-10-21 – 2020-10-22 (×3): 3 mL via RESPIRATORY_TRACT
  Filled 2020-10-21 (×3): qty 3

## 2020-10-21 MED ORDER — PREDNISONE 20 MG PO TABS: 30.0000 mg | ORAL_TABLET | Freq: Every day | ORAL | Status: DC

## 2020-10-21 MED ORDER — PREDNISONE 20 MG PO TABS: 20.0000 mg | ORAL_TABLET | Freq: Every day | ORAL | Status: DC

## 2020-10-21 MED ORDER — TRAMADOL HCL 50 MG PO TABS
50.0000 mg | ORAL_TABLET | Freq: Four times a day (QID) | ORAL | Status: DC | PRN
  Administered 2020-10-21 – 2020-10-22 (×2): 50 mg via ORAL
  Filled 2020-10-21 (×3): qty 1

## 2020-10-21 MED ORDER — PREDNISONE 20 MG PO TABS
40.0000 mg | ORAL_TABLET | Freq: Every day | ORAL | Status: AC
  Administered 2020-10-22: 40 mg via ORAL
  Filled 2020-10-21: qty 2

## 2020-10-21 MED ORDER — GADOBUTROL 1 MMOL/ML IV SOLN
7.0000 mL | Freq: Once | INTRAVENOUS | Status: AC | PRN
  Administered 2020-10-21: 7 mL via INTRAVENOUS

## 2020-10-21 MED ORDER — ENSURE ENLIVE PO LIQD
237.0000 mL | Freq: Two times a day (BID) | ORAL | Status: DC
  Administered 2020-10-22: 237 mL via ORAL

## 2020-10-21 MED ORDER — PREDNISONE 10 MG PO TABS: 10.0000 mg | ORAL_TABLET | Freq: Every day | ORAL | Status: DC

## 2020-10-21 MED ORDER — BENZONATATE 100 MG PO CAPS
100.0000 mg | ORAL_CAPSULE | Freq: Three times a day (TID) | ORAL | Status: DC | PRN
  Administered 2020-10-21: 100 mg via ORAL
  Filled 2020-10-21: qty 1

## 2020-10-21 MED ORDER — ALPRAZOLAM 0.25 MG PO TABS
0.2500 mg | ORAL_TABLET | Freq: Once | ORAL | Status: AC
  Administered 2020-10-21: 0.25 mg via ORAL
  Filled 2020-10-21: qty 1

## 2020-10-21 NOTE — Progress Notes (Signed)
Initial Nutrition Assessment  DOCUMENTATION CODES:   Non-severe (moderate) malnutrition in context of chronic illness  INTERVENTION:  Diet was liberalized to regular.  Provide Ensure Enlive po BID, each supplement provides 350 kcal and 20 grams of protein.  Encouraged adequate intake of calories and protein from meals, snacks, beverages, and oral nutrition supplements.  NUTRITION DIAGNOSIS:   Moderate Malnutrition related to chronic illness (COPD, possible metastatic primary lung neoplasm) as evidenced by moderate fat depletion, moderate muscle depletion.  GOAL:   Patient will meet greater than or equal to 90% of their needs  MONITOR:   PO intake, Supplement acceptance, Labs, Weight trends, I & O's  REASON FOR ASSESSMENT:   Malnutrition Screening Tool    ASSESSMENT:   59 year old female with PMHx of GERD, COPD admitted with weakness, chest pain, shortness of breath found to have acute exacerbation of COPD, right-sided pleural effusion and CT findings concerning for metastatic primary lung neoplasm.   Met with patient and a family member at beside. Patient reports she has had decreased appetite and intake for 3-4 months now. She endorses anorexia. She reports she tries to eat 2 meals per day but is eating very small amounts at meals. She is unable to provide further details on usual intake. Patient is amenable to drinking Ensure to help meet calorie/protein needs. Patient reports her appetite is returning today. She had eaten about 90% of her lunch tray at bedside at time of RD assessment.  Patient reports her UBW was around 180 lbs and that she has been losing weight over time. Only previous weight in chart was 87.5 kg from 11/14/2015. No recent weight history to trend.   Medications reviewed and include: Levaquin, Protonix, prednisone taper.  Labs reviewed: Sodium 134.  Discussed with MD via secure chat and okay to liberalize diet to regular.   NUTRITION - FOCUSED  PHYSICAL EXAM:    Most Recent Value  Orbital Region Moderate depletion  Upper Arm Region Severe depletion  Thoracic and Lumbar Region Moderate depletion  Buccal Region Moderate depletion  Temple Region Moderate depletion  Clavicle Bone Region Moderate depletion  Clavicle and Acromion Bone Region Severe depletion  Scapular Bone Region Moderate depletion  Dorsal Hand Moderate depletion  Patellar Region Moderate depletion  Anterior Thigh Region Moderate depletion  Posterior Calf Region Severe depletion  Edema (RD Assessment) Mild  Hair Reviewed  Eyes Reviewed  Mouth Reviewed  Skin Reviewed  Nails Reviewed     Diet Order:   Diet Order            Diet regular Room service appropriate? Yes; Fluid consistency: Thin  Diet effective now                EDUCATION NEEDS:   No education needs have been identified at this time  Skin:  Skin Assessment: Reviewed RN Assessment  Last BM:  10/18/2020 per chart  Height:   Ht Readings from Last 1 Encounters:  10/19/20 '5\' 11"'  (1.803 m)   Weight:   Wt Readings from Last 1 Encounters:  10/19/20 68.5 kg   BMI:  Body mass index is 21.06 kg/m.  Estimated Nutritional Needs:   Kcal:  1800-2000  Protein:  90-100 grams  Fluid:  1.8-2 L/day  Jacklynn Barnacle, MS, RD, LDN Pager number available on Amion

## 2020-10-21 NOTE — Progress Notes (Signed)
[place] [facility] [place] NAME: Brittney Fisher    MR#:  [id number]  DATE OF BIRTH:  February 15, 1961  SUBJECTIVE:   Came in with increasing shortness of breath and weight loss with dizziness for several weeks to months. eating lunch Sister in the room Lot of anxiety earlier  REVIEW OF SYSTEMS:   Review of Systems  Constitutional: Positive for malaise/fatigue and weight loss. Negative for chills and fever.  HENT: Negative for ear discharge, ear pain and nosebleeds.   Eyes: Negative for blurred vision, pain and discharge.  Respiratory: Positive for cough, sputum production and shortness of breath. Negative for wheezing and stridor.   Cardiovascular: Negative for chest pain, palpitations, orthopnea and PND.  Gastrointestinal: Negative for abdominal pain, diarrhea, nausea and vomiting.  Genitourinary: Negative for frequency and urgency.  Musculoskeletal: Negative for back pain and joint pain.  Neurological: Positive for weakness. Negative for sensory change, speech change and focal weakness.  Psychiatric/Behavioral: Negative for depression and hallucinations. The patient is nervous/anxious.    Tolerating Diet:yes Tolerating PT: not needed  DRUG ALLERGIES:  No Known Allergies  VITALS:  Blood pressure (!) 105/56, pulse (!) 106, temperature 98 F (36.7 C), temperature source Oral, resp. rate 20, height 5\' 11"  (1.803 m), weight 68.5 kg, SpO2 93 %.  PHYSICAL EXAMINATION:   Physical Exam  GENERAL:  59 y.o.-year-old patient lying in the bed with no acute distress.  HEENT: Head atraumatic, normocephalic. Oropharynx and nasopharynx clear.  NECK:  Supple, no jugular venous distention. No thyroid enlargement, no tenderness. No neck vein swelling LUNGS: decreased breath sounds bilaterally, no wheezing, rales, scattered rhonchi. No use of accessory muscles of respiration.  CARDIOVASCULAR: S1, S2 normal. No murmurs, rubs, or gallops.  ABDOMEN: Soft,  nontender, nondistended. Bowel sounds present. No organomegaly or mass.  EXTREMITIES: No cyanosis, clubbing or edema b/l.    NEUROLOGIC: Cranial nerves II through XII are intact. No focal Motor or sensory deficits b/l.   PSYCHIATRIC:  patient is alert and oriented x 3.  SKIN: No obvious rash, lesion, or ulcer.   LABORATORY PANEL:  CBC Recent Labs  Lab 10/21/20 0541  WBC 14.0*  HGB 11.9*  HCT 35.3*  PLT 242    Chemistries  Recent Labs  Lab 10/21/20 0541  NA 134*  K 3.9  CL 102  CO2 23  GLUCOSE 101*  BUN 13  CREATININE 0.76  CALCIUM 8.5*  AST 16  ALT 17  ALKPHOS 62  BILITOT 0.6   Cardiac Enzymes No results for input(s): TROPONINI in the last 168 hours. RADIOLOGY:  DG Chest Port 1 View  Result Date: 10/20/2020 CLINICAL DATA:  No known primary, now with concern for malignant right-sided pleural effusion post thoracentesis. EXAM: PORTABLE CHEST 1 VIEW COMPARISON:  Chest radiograph-earlier same day; chest CT-10/18/2020 FINDINGS: Interval reduction in persistent trace right-sided effusion post thoracentesis. No pneumothorax. Improved aeration of the right mid and lower lung with persistent right basilar consolidative opacities, likely atelectasis. Redemonstrated extensive bilateral nodular opacities with obscuration of the right heart border secondary to persistent atelectasis/collapse of the right upper lobe. No left-sided pleural effusion or pneumothorax. No evidence of edema. No acute osseous abnormalities. IMPRESSION: 1. Interval reduction in persistent trace right-sided effusion post thoracentesis. No pneumothorax. 2. Improved aeration of the right mid and lower lung with persistent right basilar opacities, likely atelectasis. 3. Similar findings of right upper lobe collapse and extensive metastatic disease involving the lungs bilaterally as better demonstrated recent chest CT. Electronically  Signed   By: Sandi Mariscal M.D.   On: 10/20/2020 12:17   DG Chest Port 1 View  Result  Date: 10/20/2020 CLINICAL DATA:  Shortness of breath EXAM: PORTABLE CHEST 1 VIEW COMPARISON:  10/04/2020.  Chest CT from 2 days ago FINDINGS: Large right pleural effusion obscuring most of the right lung. Reticulonodular opacity on the left due to multiple pulmonary nodules. Normal heart size. Thoracic adenopathy by CT. IMPRESSION: Extensive intrathoracic malignancy by recent chest CT. Very large right pleural effusion. Electronically Signed   By: Monte Fantasia M.D.   On: 10/20/2020 04:41   US THORACENTESIS ASP PLEURAL SPACE W/IMG GUIDE  Result Date: 10/20/2020 INDICATION: No known primary, now with concern for malignant right-sided pleural effusion. Please perform ultrasound-guided thoracentesis for diagnostic and therapeutic purposes. EXAM: US THORACENTESIS ASP PLEURAL SPACE W/IMG GUIDE COMPARISON:  Chest CT-10/18/2020 MEDICATIONS: None. COMPLICATIONS: None immediate. TECHNIQUE: Informed written consent was obtained from the patient after a discussion of the risks, benefits and alternatives to treatment. A timeout was performed prior to the initiation of the procedure. Initial ultrasound scanning demonstrates a large complex right-sided pleural effusion with large amount floating echogenic debris within the pleural fluid but without definitive loculations. The lower chest was prepped and draped in the usual sterile fashion. 1% lidocaine was used for local anesthesia. An ultrasound image was saved for documentation purposes. An 8 Fr Safe-T-Centesis catheter was introduced. The thoracentesis was performed. The catheter was removed and a dressing was applied. The patient tolerated the procedure well without immediate post procedural complication. The patient was escorted to have an upright chest radiograph. FINDINGS: A total of approximately 2.4 liters of serous fluid was removed. Requested samples were sent to the laboratory. IMPRESSION: Successful ultrasound-guided right sided thoracentesis yielding 2.4  liters of pleural fluid. Electronically Signed   By: Sandi Mariscal M.D.   On: 10/20/2020 12:15   ASSESSMENT AND PLAN:   Brittney Fisher is a 59 y.o. female with PMH of GERD and tobacco abuse brought to the ED via EMS from home with a chief complaint of generalized weakness, dizziness, chest pain and shortness of breath.  Patient is a heavy smoker who was recently diagnosed with COPD approximately 1 month ago by her PCP. Patient complains of significant weight loss of the last several months.  Right-sided pleural effusion-malignant Suspected metastatic cancer likely Lung significant weight loss -status post thoracentesis with removal of 2.4 L of fluid. Labs pending -patient seen by Dr. [name] oncology. Patient will need further workup for staging and possible biopsy if fluid cytology is inconclusive. -Pleural fluid--exudative--s/o malignancy -fluid cytology pending -MRI brain for staging--c/o HA -PET scan as out pt  SVC syndrome findings noted on CT scan -clinically patient does not have any major symptoms -she has hoarse voice and shortness of breath -vascular surgery consultation placed to review of CT scan--appreciate input. No urgent vascular intervention needed.  COPD exacerbation acute bronchitis ongoing tobacco abuse-- patient says she quit now -continue oxygen. -- Patient does qualify for home oxygen. Will arrange home health nebulizer also. -continue bronchodilators. -Will give a course of prednisone taper. -Empiric Levaquin for five days  Gerd continue PPI  DVT prophylaxis subcu Lovenox  Overall prognosis is poor  Procedures: Family communication : sister in the room Consults : oncology, vascular surgery CODE STATUS: full code per patient DVT Prophylaxis : Lovenox  Status is: Inpatient  Remains inpatient appropriate because:Inpatient level of care appropriate due to severity of illness   Dispo: The patient is from:  Home              Anticipated d/c is to: Home               Anticipated d/c date RE:[id number] if remains stable              Patient currently is not medically stable to d/c. new diagnosis of malignant pleural effusion with metastatic cancer. Patient needs MRI of the brain for headache and stage of purpose.       TOTAL TIME TAKING CARE OF THIS PATIENT: 25 minutes.  >50% time spent on counselling and coordination of care  Note: This dictation was prepared with Dragon dictation along with smaller phrase technology. Any transcriptional errors that result from this process are unintentional.  Fritzi Mandes M.D    [facility]   CC: Primary care physician; Ranae Plumber, PAPatient ID: Brittney Fisher, female   DOB: 31-May-1961, 59 y.o.   MRN: [id number]

## 2020-10-21 NOTE — TOC Initial Note (Signed)
Transition of Care [facility]) - Initial/Assessment Note    Patient Details  Name: Brittney Fisher MRN: [id number] Date of Birth: 15-May-1961  Transition of Care [facility]) CM/SW Contact:    Shelbie Ammons, RN Phone Number: 10/21/2020, 3:00 PM  Clinical Narrative:     RNCM met with patient in room. Patient up ambulating back from bathroom, reports this is the first time she has felt like getting up and moving around due to shortness of breath. Patient lives at home alone but does have some family support. She came into the hospital due to some worsening shortness of breath and chest pain. She is diagnosed with stage 4 Lung CA. Discussed with patient new recommendations for oxygen and nebulizer at home for which patient is agreeable. Patient reports she thinks she will likely be going home tomorrow.  RNCM reached out to [facility] with [employer] with orders for oxygen and nebulizer.        Expected Discharge Plan: Home/Self Care Barriers to Discharge: Continued Medical Work up   Patient Goals and CMS Choice        Expected Discharge Plan and Services Expected Discharge Plan: Home/Self Care       Living arrangements for the past 2 months: Single Family Home                 DME Arranged: Oxygen, Nebulizer machine DME Agency: [employer] Date DME Agency Contacted: 10/21/20 Time DME Agency Contacted: 59 Representative spoke with at DME Agency: Zack            Prior Living Arrangements/Services Living arrangements for the past 2 months: [place] with:: Self   Do you feel safe going back to the place where you live?: Yes      Need for Family Participation in Patient Care: Yes (Comment) Care giver support system in place?: Yes (comment)   Criminal Activity/Legal Involvement Pertinent to Current Situation/Hospitalization: No - Comment as needed  Activities of Daily Living Home Assistive Devices/Equipment: None ADL Screening (condition at time of admission) Patient's cognitive  ability adequate to safely complete daily activities?: Yes Is the patient deaf or have difficulty hearing?: No Does the patient have difficulty seeing, even when wearing glasses/contacts?: No Does the patient have difficulty concentrating, remembering, or making decisions?: No Patient able to express need for assistance with ADLs?: Yes Does the patient have difficulty dressing or bathing?: No Independently performs ADLs?: Yes (appropriate for developmental age) Does the patient have difficulty walking or climbing stairs?: No Weakness of Legs: None Weakness of Arms/Hands: None  Permission Sought/Granted                  Emotional Assessment Appearance:: Appears stated age Attitude/Demeanor/Rapport: Engaged Affect (typically observed): Appropriate Orientation: : Oriented to Place, Oriented to Self, Oriented to  Time, Oriented to Situation Alcohol / Substance Use: Tobacco Use Psych Involvement: No (comment)  Admission diagnosis:  Shortness of breath [R06.02] Dehydration [E86.0] Dizziness [R42] Weakness [R53.1] Pleural effusion [J90] Malignant pleural effusion [J91.0] S/P thoracentesis [Z98.890] Pleural effusion on right [J90] Chronic obstructive pulmonary disease with acute exacerbation ([place]) [J44.1] Patient Active Problem List   Diagnosis Date Noted  . Pleural effusion 10/20/2020  . Malignant pleural effusion   . Chronic obstructive pulmonary disease with acute exacerbation ([place])   . Dizziness   . Metastatic malignant neoplasm ([place])   . Shortness of breath   . S/P thoracentesis   . Lung mass    PCP:  Ranae Plumber, PA Pharmacy:  [facility] ([facility]), [place] - [address] [place] [address] [address] Phone: [phone] Fax: [phone]     Social Determinants of Health (SDOH) Interventions    Readmission Risk Interventions No flowsheet data found.

## 2020-10-21 NOTE — Progress Notes (Signed)
Hematology/Oncology Progress Note [facility] Telephone:([phone] [phone]  Patient Care Team: Ranae Plumber, [place] as PCP - General (Family Medicine) Telford Nab, RN as Oncology Nurse Navigator   Name of the patient: Brittney Fisher  [id number]  02-Feb-1961  Date of visit: 10/21/20   INTERVAL HISTORY-  Patient reports feeling better today.  Shortness of breath has improved.  Off oxygen currently. Mild headache   Review of systems- Review of Systems  Constitutional: Positive for appetite change, fatigue and unexpected weight change.  Respiratory: Positive for shortness of breath.   Cardiovascular: Negative for chest pain.  Gastrointestinal: Negative for abdominal distention and abdominal pain.  Genitourinary: Negative for dysuria.   Musculoskeletal: Negative for back pain.  Skin: Negative for rash.  Neurological: Positive for headaches. Negative for light-headedness.  Hematological: Negative for adenopathy.  Psychiatric/Behavioral: Negative for confusion.    No Known Allergies  Patient Active Problem List   Diagnosis Date Noted  . Pleural effusion 10/20/2020  . Malignant pleural effusion   . Chronic obstructive pulmonary disease with acute exacerbation ([place])   . Dizziness   . Metastatic malignant neoplasm ([place])   . Shortness of breath   . S/P thoracentesis   . Lung mass      Past Medical History:  Diagnosis Date  . COPD (chronic obstructive pulmonary disease) ([place])   . GERD (gastroesophageal reflux disease)      Past Surgical History:  Procedure Laterality Date  . COLONOSCOPY WITH ESOPHAGOGASTRODUODENOSCOPY (EGD)    . COLONOSCOPY WITH PROPOFOL N/A 11/14/2015   Procedure: COLONOSCOPY WITH PROPOFOL;  Surgeon: Hulen Luster, MD;  Location: [facility] ENDOSCOPY;  Service: Gastroenterology;  Laterality: N/A;  . ESOPHAGOGASTRODUODENOSCOPY (EGD) WITH PROPOFOL N/A 11/14/2015   Procedure: ESOPHAGOGASTRODUODENOSCOPY (EGD) WITH PROPOFOL;   Surgeon: Hulen Luster, MD;  Location: [facility] ENDOSCOPY;  Service: Gastroenterology;  Laterality: N/A;    Social History   Socioeconomic History  . Marital status: Married    Spouse name: Not on file  . Number of children: Not on file  . Years of education: Not on file  . Highest education level: Not on file  Occupational History  . Not on file  Tobacco Use  . Smoking status: Current Every Day Smoker    Packs/day: 0.20    Types: Cigarettes  . Smokeless tobacco: Never Used  Substance and Sexual Activity  . Alcohol use: Never  . Drug use: Never  . Sexual activity: Not Currently  Other Topics Concern  . Not on file  Social History Narrative  . Not on file   Social Determinants of Health   Financial Resource Strain:   . Difficulty of Paying Living Expenses: Not on file  Food Insecurity:   . Worried About Charity fundraiser in the Last Year: Not on file  . Ran Out of Food in the Last Year: Not on file  Transportation Needs:   . Lack of Transportation (Medical): Not on file  . Lack of Transportation (Non-Medical): Not on file  Physical Activity:   . Days of Exercise per Week: Not on file  . Minutes of Exercise per Session: Not on file  Stress:   . Feeling of Stress : Not on file  Social Connections:   . Frequency of Communication with Friends and Family: Not on file  . Frequency of Social Gatherings with Friends and Family: Not on file  . Attends Religious Services: Not on file  . Active Member of Clubs or Organizations: Not on file  .  Attends Archivist Meetings: Not on file  . Marital Status: Not on file  Intimate Partner Violence:   . Fear of Current or Ex-Partner: Not on file  . Emotionally Abused: Not on file  . Physically Abused: Not on file  . Sexually Abused: Not on file     Family History  Problem Relation Age of Onset  . Breast cancer Cousin 13  . Diabetes type II Mother   . Hypertension Mother   . Colon cancer Father   . Hypertension Father       Current Facility-Administered Medications:  .  acetaminophen (TYLENOL) tablet 650 mg, 650 mg, Oral, Q6H PRN **OR** acetaminophen (TYLENOL) suppository 650 mg, 650 mg, Rectal, Q6H PRN, Mansy, Jan A, MD .  chlorpheniramine-HYDROcodone (TUSSIONEX) 10-8 MG/5ML suspension 5 mL, 5 mL, Oral, Q12H PRN, Mansy, Jan A, MD, 5 mL at 10/21/20 0948 .  enoxaparin (LOVENOX) injection 40 mg, 40 mg, Subcutaneous, Q24H, Mansy, Jan A, MD, 40 mg at 10/21/20 0947 .  guaiFENesin (MUCINEX) 12 hr tablet 600 mg, 600 mg, Oral, BID, Mansy, Jan A, MD, 600 mg at 10/21/20 0947 .  ipratropium-albuterol (DUONEB) 0.5-2.5 (3) MG/3ML nebulizer solution 3 mL, 3 mL, Nebulization, TID, Fritzi Mandes, MD .  levofloxacin (LEVAQUIN) tablet 750 mg, 750 mg, Oral, Daily, Fritzi Mandes, MD, 750 mg at 10/21/20 0947 .  magnesium hydroxide (MILK OF MAGNESIA) suspension 30 mL, 30 mL, Oral, Daily PRN, Mansy, Jan A, MD .  pantoprazole (PROTONIX) EC tablet 40 mg, 40 mg, Oral, Daily, Mansy, Jan A, MD, 40 mg at 10/21/20 0947 .  predniSONE (DELTASONE) tablet 50 mg, 50 mg, Oral, Once **FOLLOWED BY** [START ON 10/22/2020] predniSONE (DELTASONE) tablet 40 mg, 40 mg, Oral, Q breakfast **FOLLOWED BY** [START ON 10/23/2020] predniSONE (DELTASONE) tablet 30 mg, 30 mg, Oral, Q breakfast **FOLLOWED BY** [START ON 10/24/2020] predniSONE (DELTASONE) tablet 20 mg, 20 mg, Oral, Q breakfast **FOLLOWED BY** [START ON 10/25/2020] predniSONE (DELTASONE) tablet 10 mg, 10 mg, Oral, Q breakfast, Fritzi Mandes, MD .  traZODone (DESYREL) tablet 25 mg, 25 mg, Oral, QHS PRN, Mansy, Arvella Merles, MD   Physical exam:  Vitals:   10/21/20 0620 10/21/20 0741 10/21/20 0753 10/21/20 1113  BP: 120/73  (!) 113/58 (!) 105/56  Pulse: 97  (!) 103 (!) 106  Resp: 16  18 20   Temp: 98.3 F (36.8 C)  98.4 F (36.9 C) 98 F (36.7 C)  TempSrc: Oral   Oral  SpO2: 94% 91% 100% 95%  Weight:      Height:       Physical Exam Constitutional:      General: She is not in acute distress.     Appearance: She is not diaphoretic.  HENT:     Head: Normocephalic and atraumatic.     Nose: Nose normal.     Mouth/Throat:     Pharynx: No oropharyngeal exudate.  Eyes:     General: No scleral icterus.    Pupils: Pupils are equal, round, and reactive to light.  Cardiovascular:     Rate and Rhythm: Normal rate and regular rhythm.     Heart sounds: No murmur heard.   Pulmonary:     Effort: Pulmonary effort is normal. No respiratory distress.     Breath sounds: No rales.     Comments: Decreased breath sounds at the right side. Chest:     Chest wall: No tenderness.  Abdominal:     General: There is no distension.     Palpations: Abdomen is  soft.     Tenderness: There is no abdominal tenderness.  Musculoskeletal:        General: Normal range of motion.     Cervical back: Normal range of motion and neck supple.  Skin:    General: Skin is warm and dry.     Findings: No erythema.  Neurological:     Mental Status: She is alert and oriented to person, place, and time.     Cranial Nerves: No cranial nerve deficit.     Motor: No abnormal muscle tone.     Coordination: Coordination normal.  Psychiatric:        Mood and Affect: Affect normal.        CMP Latest Ref Rng & Units 10/21/2020  Glucose 70 - 99 mg/dL 101(H)  BUN 8 - 23 mg/dL 13  Creatinine 0.44 - 1.00 mg/dL 0.76  Sodium 135 - 145 mmol/L 134(L)  Potassium 3.5 - 5.1 mmol/L 3.9  Chloride 98 - 111 mmol/L 102  CO2 22 - 32 mmol/L 23  Calcium 8.9 - 10.3 mg/dL 8.5(L)  Total Protein 6.5 - 8.1 g/dL 6.4(L)  Total Bilirubin 0.3 - 1.2 mg/dL 0.6  Alkaline Phos 38 - 126 U/L 62  AST 15 - 41 U/L 16  ALT 0 - 44 U/L 17   CBC Latest Ref Rng & Units 10/21/2020  WBC 4.0 - 10.5 K/uL 14.0(H)  Hemoglobin 12.0 - 15.0 g/dL 11.9(L)  Hematocrit 36 - 46 % 35.3(L)  Platelets 150 - 400 K/uL 242    RADIOGRAPHIC STUDIES: I have personally reviewed the radiological images as listed and agreed with the findings in the report. DG Chest 2  View  Result Date: 10/05/2020 CLINICAL DATA:  59 year old female with history of COPD exacerbation. Possible pneumonia. EXAM: CHEST - 2 VIEW COMPARISON:  No priors. FINDINGS: Multiple pulmonary nodules are noted in the lungs bilaterally, concerning for metastatic disease. Opacity in the right base which may reflect atelectasis and/or consolidation as well as superimposed moderate to large right pleural effusion. No left pleural effusion. No evidence of pulmonary edema. Heart size is normal. Upper mediastinal contours are within normal limits. IMPRESSION: 1. Findings are highly concerning for widespread metastatic disease to the chest, likely with a malignant right pleural effusion. Further evaluation with contrast enhanced chest CT is strongly recommended in the immediate future to better evaluate these findings. These results will be called to the ordering clinician or representative by the Radiologist Assistant, and communication documented in the PACS or [employer]. Electronically Signed   By: Vinnie Langton M.D.   On: 10/05/2020 16:58   CT CHEST W CONTRAST  Result Date: 10/18/2020 CLINICAL DATA:  Malignant pleural effusion. Recently diagnosed with COPD. Abnormal chest radiograph. EXAM: CT CHEST WITH CONTRAST TECHNIQUE: Multidetector CT imaging of the chest was performed during intravenous contrast administration. CONTRAST:  90mL ISOVUE-300 IOPAMIDOL (ISOVUE-300) INJECTION 61% COMPARISON:  10/05/2020 chest radiograph. FINDINGS: Cardiovascular: Advanced aortic and branch vessel atherosclerosis. Tortuous thoracic aorta. Normal heart size with minimal pericardial fluid. Left upper lobe pulmonary vein is surrounded and narrowed by adenopathy on 70/2. Pulmonary artery branches are also narrowed by adenopathy and the central right upper lobe lung mass. No pulmonary artery filling defect. SVC narrowing including on 60/2 and coronal image 62. Mediastinum/Nodes: Right supraclavicular/low jugular 1.5 cm node  on 09/02. Massive thoracic adenopathy. Pretracheal mass/adenopathy measures 3.7 cm on 55/2. Subcarinal adenopathy measures 2.1 cm on 75/2. Extensive left hilar and infrahilar adenopathy, including a nodal mass of 1.6 x 3.7 cm  on 70/2. Prevascular adenopathy at 1.7 cm on 64/2. Juxta diaphragmatic left-sided nodes or nodules including at 9 mm on 147/2. Lungs/Pleura: Moderate to large right pleural effusion. Extensive irregular soft tissue thickening within the right pleural space, including on 83/2, consistent with pleural metastasis. Anterior right pleural implant measures 5.9 cm maximally on 122/2. Mild tracheal deviation to the right. Right upper lobe bronchial obstruction on 62/3. Right middle lobe endobronchial obstruction 80/3. Significant mass-effect upon right lower lobe bronchi with near complete obstruction. A central right upper lobe lung mass with direct mediastinal invasion is most apparent on soft tissue windows, including at 7.1 x 6.4 cm on 57/2. A combination of postobstructive and compressive atelectasis involves the majority of the remainder of the right lung. Extensive bilateral pulmonary nodules/metastasis. Index left upper lobe 1.0 cm nodule on 103/3. Index left lower lobe pulmonary nodule measures 1.0 cm on 111/3. Right lower lobe lung nodule of 11 mm on 110/3. Upper Abdomen: Motion degradation in the upper abdomen. Normal imaged portions of the liver, spleen, pancreas, gallbladder. Left greater than right, relatively mild adrenal thickening and nodularity. Proximal gastric underdistention. Upper pole 1.3 cm left renal cyst. Interpolar right renal too small to characterize lesion. Abdominal aortic atherosclerosis. Musculoskeletal: Multiple sclerotic foci, including within the sternal manubrium on sagittal image 97. Lytic lesion within the T2 vertebral body measures 11 mm on sagittal image 99. IMPRESSION: 1. 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  2. Metastatic disease to low cervical/thoracic nodes, lungs, right pleural space, and bones. 3. Moderate to large right pleural effusion. 4. SVC narrowing, suggesting impending SVC syndrome. Consider relatively urgent multidisciplinary thoracic oncology consultation. These results will be called to the ordering clinician or representative by the Radiologist Assistant, and communication documented in the PACS or [employer]. Electronically Signed   By: Abigail Miyamoto M.D.   On: 10/18/2020 16:42   DG Chest Port 1 View  Result Date: 10/20/2020 CLINICAL DATA:  No known primary, now with concern for malignant right-sided pleural effusion post thoracentesis. EXAM: PORTABLE CHEST 1 VIEW COMPARISON:  Chest radiograph-earlier same day; chest CT-10/18/2020 FINDINGS: Interval reduction in persistent trace right-sided effusion post thoracentesis. No pneumothorax. Improved aeration of the right mid and lower lung with persistent right basilar consolidative opacities, likely atelectasis. Redemonstrated extensive bilateral nodular opacities with obscuration of the right heart border secondary to persistent atelectasis/collapse of the right upper lobe. No left-sided pleural effusion or pneumothorax. No evidence of edema. No acute osseous abnormalities. IMPRESSION: 1. Interval reduction in persistent trace right-sided effusion post thoracentesis. No pneumothorax. 2. Improved aeration of the right mid and lower lung with persistent right basilar opacities, likely atelectasis. 3. Similar findings of right upper lobe collapse and extensive metastatic disease involving the lungs bilaterally as better demonstrated recent chest CT. Electronically Signed   By: Sandi Mariscal M.D.   On: 10/20/2020 12:17   DG Chest Port 1 View  Result Date: 10/20/2020 CLINICAL DATA:  Shortness of breath EXAM: PORTABLE CHEST 1 VIEW COMPARISON:  10/04/2020.  Chest CT from 2 days ago FINDINGS: Large right pleural effusion obscuring most of the right  lung. Reticulonodular opacity on the left due to multiple pulmonary nodules. Normal heart size. Thoracic adenopathy by CT. IMPRESSION: Extensive intrathoracic malignancy by recent chest CT. Very large right pleural effusion. Electronically Signed   By: Monte Fantasia M.D.   On: 10/20/2020 04:41   US THORACENTESIS ASP PLEURAL SPACE W/IMG GUIDE  Result Date: 10/20/2020 INDICATION: No known primary, now with  concern for malignant right-sided pleural effusion. Please perform ultrasound-guided thoracentesis for diagnostic and therapeutic purposes. EXAM: US THORACENTESIS ASP PLEURAL SPACE W/IMG GUIDE COMPARISON:  Chest CT-10/18/2020 MEDICATIONS: None. COMPLICATIONS: None immediate. TECHNIQUE: Informed written consent was obtained from the patient after a discussion of the risks, benefits and alternatives to treatment. A timeout was performed prior to the initiation of the procedure. Initial ultrasound scanning demonstrates a large complex right-sided pleural effusion with large amount floating echogenic debris within the pleural fluid but without definitive loculations. The lower chest was prepped and draped in the usual sterile fashion. 1% lidocaine was used for local anesthesia. An ultrasound image was saved for documentation purposes. An 8 Fr Safe-T-Centesis catheter was introduced. The thoracentesis was performed. The catheter was removed and a dressing was applied. The patient tolerated the procedure well without immediate post procedural complication. The patient was escorted to have an upright chest radiograph. FINDINGS: A total of approximately 2.4 liters of serous fluid was removed. Requested samples were sent to the laboratory. IMPRESSION: Successful ultrasound-guided right sided thoracentesis yielding 2.4 liters of pleural fluid. Electronically Signed   By: Sandi Mariscal M.D.   On: 10/20/2020 12:15    Assessment and plan-  Patient is a 59 y.o. female who is a current everyday smoker, COPD who presents to  emergency room for evaluation of worsening of dyspnea and weakness.  CT findings are concerning for metastatic primary lung neoplasm  #Right upper lobe mass with direct invasion into mediastinum, as well as extensive cervical/thoracic lymphadenopathy, Bilateral lung nodules,bone lesions, right pleural effusion, SVC narrowing Status post right thoracentesis, Pleural fluid is exudate likely malignant.  Cytology is pending. She has some headache.  Given her extensive tumor burden, I recommend inpatient MRI brain with and without contrast to speed up her staging work-up. Patient will get PET scan outpatient for staging   SVC narrowing, no clinical signs of SVC syndrome. Patient has been evaluated by vascular surgeon.  No intervention needed at this point. Outpatient she will need palliative radiation  Unintentional weight loss, likely due to underlying malignancy. Thank you for allowing me to participate in the care of this patient.   [name], MD, PhD Hematology Oncology [facility] Pager- [phone] 10/21/2020

## 2020-10-21 NOTE — Addendum Note (Signed)
Addended by: Telford Nab on: 10/21/2020 03:32 PM   Modules accepted: Orders

## 2020-10-21 NOTE — Progress Notes (Signed)
SATURATION QUALIFICATIONS: (This note is used to comply with regulatory documentation for home oxygen)  Patient Saturations on Room Air at Rest =  95  Patient Saturations on Room Air while Ambulating =  86 Patient Saturations on 2 Liters of oxygen while Ambulating = 92  Please briefly explain why patient needs home oxygen: pt has metastatic lung cancer,  right lung pleural effusion, COPD

## 2020-10-21 NOTE — Consult Note (Signed)
[facility] Vascular Consult Note  MRN : [id number]  Brittney Fisher is a 59 y.o. (13-Jan-1961) female who presents with chief complaint of  Chief Complaint  Patient presents with   Dizziness   Shortness of Breath   History of Present Illness:  Brittney Fisher is a 59 y.o. African-American female with a known history of GERD, ongoing tobacco abuse and COPD and recently diagnosed metastatic lung cancer by chest CT that was done yesterday.  She has been following with her primary care physician has been losing weight unintentionally.  She lost 30 to 40 pounds in the last 6 months.  She presents today with worsening dyspnea with associated chest pain as well as generalized weakness.  She has been experiencing palpitations with movement.  She admits to chills without fever.  She has been having cough productive of thick whitish sputum and wheezing.  No nausea or vomiting or abdominal pain.  No dysuria, oliguria or hematuria or flank pain.  Vascular surgery was consulted in regard to narrowing of the SVC seen on CT by Dr. Posey Pronto   Current Facility-Administered Medications  Medication Dose Route Frequency Provider Last Rate Last Admin   0.9 %  sodium chloride infusion   Intravenous Continuous Mansy, Jan A, MD 100 mL/hr at 10/21/20 0521 New Bag at 10/21/20 0521   acetaminophen (TYLENOL) tablet 650 mg  650 mg Oral Q6H PRN Mansy, Jan A, MD       Or   acetaminophen (TYLENOL) suppository 650 mg  650 mg Rectal Q6H PRN Mansy, Jan A, MD       chlorpheniramine-HYDROcodone (TUSSIONEX) 10-8 MG/5ML suspension 5 mL  5 mL Oral Q12H PRN Mansy, Jan A, MD   5 mL at 10/21/20 0948   enoxaparin (LOVENOX) injection 40 mg  40 mg Subcutaneous Q24H Mansy, Jan A, MD   40 mg at 10/21/20 0947   guaiFENesin (MUCINEX) 12 hr tablet 600 mg  600 mg Oral BID Mansy, Jan A, MD   600 mg at 10/21/20 0947   ipratropium-albuterol (DUONEB) 0.5-2.5 (3) MG/3ML nebulizer solution 3 mL  3 mL Nebulization TID  Fritzi Mandes, MD       levofloxacin [facility]) tablet 750 mg  750 mg Oral Daily Fritzi Mandes, MD   750 mg at 10/21/20 0947   magnesium hydroxide (MILK OF MAGNESIA) suspension 30 mL  30 mL Oral Daily PRN Mansy, Jan A, MD       pantoprazole (PROTONIX) EC tablet 40 mg  40 mg Oral Daily Mansy, Jan A, MD   40 mg at 10/21/20 0947   traZODone (DESYREL) tablet 25 mg  25 mg Oral QHS PRN Mansy, Arvella Merles, MD       Past Medical History:  Diagnosis Date   COPD (chronic obstructive pulmonary disease) ([place])    GERD (gastroesophageal reflux disease)    Past Surgical History:  Procedure Laterality Date   COLONOSCOPY WITH ESOPHAGOGASTRODUODENOSCOPY (EGD)     COLONOSCOPY WITH PROPOFOL N/A 11/14/2015   Procedure: COLONOSCOPY WITH PROPOFOL;  Surgeon: Hulen Luster, MD;  Location: [facility] ENDOSCOPY;  Service: Gastroenterology;  Laterality: N/A;   ESOPHAGOGASTRODUODENOSCOPY (EGD) WITH PROPOFOL N/A 11/14/2015   Procedure: ESOPHAGOGASTRODUODENOSCOPY (EGD) WITH PROPOFOL;  Surgeon: Hulen Luster, MD;  Location: [facility] ENDOSCOPY;  Service: Gastroenterology;  Laterality: N/A;   Social History Social History   Tobacco Use   Smoking status: Current Every Day Smoker    Packs/day: 0.20    Types: Cigarettes   Smokeless tobacco: Never Used  Substance  Use Topics   Alcohol use: Never   Drug use: Never   Family History Family History  Problem Relation Age of Onset   Breast cancer Cousin 43   Diabetes type II Mother    Hypertension Mother    Colon cancer Father    Hypertension Father     No Known Allergies  REVIEW OF SYSTEMS (Negative unless checked)  Constitutional: [x] Weight loss  [] Fever  [] Chills Cardiac: [] Chest pain   [] Chest pressure   [] Palpitations   [x] Shortness of breath when laying flat   [x] Shortness of breath at rest   [x] Shortness of breath with exertion. Vascular:  [] Pain in legs with walking   [] Pain in legs at rest   [] Pain in legs when laying flat   [] Claudication   [] Pain in feet when  walking  [] Pain in feet at rest  [] Pain in feet when laying flat   [] History of DVT   [] Phlebitis   [] Swelling in legs   [] Varicose veins   [] Non-healing ulcers Pulmonary:   [] Uses home oxygen   [] Productive cough   [] Hemoptysis   [] Wheeze  [] COPD   [] Asthma Neurologic:  [] Dizziness  [] Blackouts   [] Seizures   [] History of stroke   [] History of TIA  [] Aphasia   [] Temporary blindness   [] Dysphagia   [] Weakness or numbness in arms   [] Weakness or numbness in legs Musculoskeletal:  [] Arthritis   [] Joint swelling   [] Joint pain   [] Low back pain Hematologic:  [] Easy bruising  [] Easy bleeding   [] Hypercoagulable state   [] Anemic  [] Hepatitis Gastrointestinal:  [] Blood in stool   [] Vomiting blood  [] Gastroesophageal reflux/heartburn   [] Difficulty swallowing. Genitourinary:  [] Chronic kidney disease   [] Difficult urination  [] Frequent urination  [] Burning with urination   [] Blood in urine Skin:  [] Rashes   [] Ulcers   [] Wounds Psychological:  [] History of anxiety   []  History of major depression.  Physical Examination  Vitals:   10/21/20 0227 10/21/20 0620 10/21/20 0741 10/21/20 0753  BP: 118/66 120/73  (!) 113/58  Pulse: (!) 102 97  (!) 103  Resp: 16 16  18   Temp: 98.1 F (36.7 C) 98.3 F (36.8 C)  98.4 F (36.9 C)  TempSrc: Oral Oral    SpO2: 97% 94% 91% 100%  Weight:      Height:       Body mass index is 21.06 kg/m. Gen:  WD/WN, NAD, no facial swelling noted. Head: [place]/AT, No temporalis wasting. Prominent temp pulse not noted. Ear/Nose/Throat: Hearing grossly intact, nares w/o erythema or drainage, oropharynx w/o Erythema/Exudate Eyes: Sclera non-icteric, conjunctiva clear Neck: Trachea midline.  No JVD.  No swelling to the neck noted. Pulmonary:  Good air movement, respirations not labored, equal bilaterally.  Cardiac: RRR, normal S1, S2. Vascular:  Vessel Right Left  Radial Palpable Palpable  Ulnar Palpable Palpable  Brachial Palpable Palpable  Carotid Palpable, without bruit  Palpable, without bruit  Aorta Not palpable N/A  Femoral Palpable Palpable  Popliteal Palpable Palpable  PT Palpable Palpable  DP Palpable Palpable   Gastrointestinal: soft, non-tender/non-distended. No guarding/reflex.  Musculoskeletal: M/S 5/5 throughout.  Extremities without ischemic changes.  No deformity or atrophy. No edema. Neurologic: Sensation grossly intact in extremities.  Symmetrical.  Speech is fluent. Motor exam as listed above. Psychiatric: Judgment intact, Mood & affect appropriate for pt's clinical situation. Dermatologic: No rashes or ulcers noted.  No cellulitis or open wounds. Lymph : No Cervical, Axillary, or Inguinal lymphadenopathy.  CBC Lab Results  Component Value Date  WBC 14.0 (H) 10/21/2020   HGB 11.9 (L) 10/21/2020   HCT 35.3 (L) 10/21/2020   MCV 79.7 (L) 10/21/2020   PLT 242 10/21/2020   BMET    Component Value Date/Time   NA 134 (L) 10/21/2020 0541   K 3.9 10/21/2020 0541   CL 102 10/21/2020 0541   CO2 23 10/21/2020 0541   GLUCOSE 101 (H) 10/21/2020 0541   BUN 13 10/21/2020 0541   CREATININE 0.76 10/21/2020 0541   CALCIUM 8.5 (L) 10/21/2020 0541   GFRNONAA >60 10/21/2020 0541   Estimated Creatinine Clearance: 79.9 mL/min (by C-G formula based on SCr of 0.76 mg/dL).  COAG No results found for: INR, PROTIME  Radiology DG Chest 2 View  Result Date: 10/05/2020 CLINICAL DATA:  59 year old female with history of COPD exacerbation. Possible pneumonia. EXAM: CHEST - 2 VIEW COMPARISON:  No priors. FINDINGS: Multiple pulmonary nodules are noted in the lungs bilaterally, concerning for metastatic disease. Opacity in the right base which may reflect atelectasis and/or consolidation as well as superimposed moderate to large right pleural effusion. No left pleural effusion. No evidence of pulmonary edema. Heart size is normal. Upper mediastinal contours are within normal limits. IMPRESSION: 1. Findings are highly concerning for widespread metastatic  disease to the chest, likely with a malignant right pleural effusion. Further evaluation with contrast enhanced chest CT is strongly recommended in the immediate future to better evaluate these findings. These results will be called to the ordering clinician or representative by the Radiologist Assistant, and communication documented in the PACS or [employer]. Electronically Signed   By: Vinnie Langton M.D.   On: 10/05/2020 16:58   CT CHEST W CONTRAST  Result Date: 10/18/2020 CLINICAL DATA:  Malignant pleural effusion. Recently diagnosed with COPD. Abnormal chest radiograph. EXAM: CT CHEST WITH CONTRAST TECHNIQUE: Multidetector CT imaging of the chest was performed during intravenous contrast administration. CONTRAST:  42mL ISOVUE-300 IOPAMIDOL (ISOVUE-300) INJECTION 61% COMPARISON:  10/05/2020 chest radiograph. FINDINGS: Cardiovascular: Advanced aortic and branch vessel atherosclerosis. Tortuous thoracic aorta. Normal heart size with minimal pericardial fluid. Left upper lobe pulmonary vein is surrounded and narrowed by adenopathy on 70/2. Pulmonary artery branches are also narrowed by adenopathy and the central right upper lobe lung mass. No pulmonary artery filling defect. SVC narrowing including on 60/2 and coronal image 62. Mediastinum/Nodes: Right supraclavicular/low jugular 1.5 cm node on 09/02. Massive thoracic adenopathy. Pretracheal mass/adenopathy measures 3.7 cm on 55/2. Subcarinal adenopathy measures 2.1 cm on 75/2. Extensive left hilar and infrahilar adenopathy, including a nodal mass of 1.6 x 3.7 cm on 70/2. Prevascular adenopathy at 1.7 cm on 64/2. Juxta diaphragmatic left-sided nodes or nodules including at 9 mm on 147/2. Lungs/Pleura: Moderate to large right pleural effusion. Extensive irregular soft tissue thickening within the right pleural space, including on 83/2, consistent with pleural metastasis. Anterior right pleural implant measures 5.9 cm maximally on 122/2. Mild tracheal  deviation to the right. Right upper lobe bronchial obstruction on 62/3. Right middle lobe endobronchial obstruction 80/3. Significant mass-effect upon right lower lobe bronchi with near complete obstruction. A central right upper lobe lung mass with direct mediastinal invasion is most apparent on soft tissue windows, including at 7.1 x 6.4 cm on 57/2. A combination of postobstructive and compressive atelectasis involves the majority of the remainder of the right lung. Extensive bilateral pulmonary nodules/metastasis. Index left upper lobe 1.0 cm nodule on 103/3. Index left lower lobe pulmonary nodule measures 1.0 cm on 111/3. Right lower lobe lung nodule of 11 mm on 110/3.  Upper Abdomen: Motion degradation in the upper abdomen. Normal imaged portions of the liver, spleen, pancreas, gallbladder. Left greater than right, relatively mild adrenal thickening and nodularity. Proximal gastric underdistention. Upper pole 1.3 cm left renal cyst. Interpolar right renal too small to characterize lesion. Abdominal aortic atherosclerosis. Musculoskeletal: Multiple sclerotic foci, including within the sternal manubrium on sagittal image 97. Lytic lesion within the T2 vertebral body measures 11 mm on sagittal image 99. IMPRESSION: 1. 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 2. Metastatic disease to low cervical/thoracic nodes, lungs, right pleural space, and bones. 3. Moderate to large right pleural effusion. 4. SVC narrowing, suggesting impending SVC syndrome. Consider relatively urgent multidisciplinary thoracic oncology consultation. These results will be called to the ordering clinician or representative by the Radiologist Assistant, and communication documented in the PACS or [employer]. Electronically Signed   By: Abigail Miyamoto M.D.   On: 10/18/2020 16:42   DG Chest Port 1 View  Result Date: 10/20/2020 CLINICAL DATA:  No known primary, now with concern for malignant  right-sided pleural effusion post thoracentesis. EXAM: PORTABLE CHEST 1 VIEW COMPARISON:  Chest radiograph-earlier same day; chest CT-10/18/2020 FINDINGS: Interval reduction in persistent trace right-sided effusion post thoracentesis. No pneumothorax. Improved aeration of the right mid and lower lung with persistent right basilar consolidative opacities, likely atelectasis. Redemonstrated extensive bilateral nodular opacities with obscuration of the right heart border secondary to persistent atelectasis/collapse of the right upper lobe. No left-sided pleural effusion or pneumothorax. No evidence of edema. No acute osseous abnormalities. IMPRESSION: 1. Interval reduction in persistent trace right-sided effusion post thoracentesis. No pneumothorax. 2. Improved aeration of the right mid and lower lung with persistent right basilar opacities, likely atelectasis. 3. Similar findings of right upper lobe collapse and extensive metastatic disease involving the lungs bilaterally as better demonstrated recent chest CT. Electronically Signed   By: Sandi Mariscal M.D.   On: 10/20/2020 12:17   DG Chest Port 1 View  Result Date: 10/20/2020 CLINICAL DATA:  Shortness of breath EXAM: PORTABLE CHEST 1 VIEW COMPARISON:  10/04/2020.  Chest CT from 2 days ago FINDINGS: Large right pleural effusion obscuring most of the right lung. Reticulonodular opacity on the left due to multiple pulmonary nodules. Normal heart size. Thoracic adenopathy by CT. IMPRESSION: Extensive intrathoracic malignancy by recent chest CT. Very large right pleural effusion. Electronically Signed   By: Monte Fantasia M.D.   On: 10/20/2020 04:41   US THORACENTESIS ASP PLEURAL SPACE W/IMG GUIDE  Result Date: 10/20/2020 INDICATION: No known primary, now with concern for malignant right-sided pleural effusion. Please perform ultrasound-guided thoracentesis for diagnostic and therapeutic purposes. EXAM: US THORACENTESIS ASP PLEURAL SPACE W/IMG GUIDE COMPARISON:   Chest CT-10/18/2020 MEDICATIONS: None. COMPLICATIONS: None immediate. TECHNIQUE: Informed written consent was obtained from the patient after a discussion of the risks, benefits and alternatives to treatment. A timeout was performed prior to the initiation of the procedure. Initial ultrasound scanning demonstrates a large complex right-sided pleural effusion with large amount floating echogenic debris within the pleural fluid but without definitive loculations. The lower chest was prepped and draped in the usual sterile fashion. 1% lidocaine was used for local anesthesia. An ultrasound image was saved for documentation purposes. An 8 Fr Safe-T-Centesis catheter was introduced. The thoracentesis was performed. The catheter was removed and a dressing was applied. The patient tolerated the procedure well without immediate post procedural complication. The patient was escorted to have an upright chest radiograph. FINDINGS: A total of approximately 2.4 liters  of serous fluid was removed. Requested samples were sent to the laboratory. IMPRESSION: Successful ultrasound-guided right sided thoracentesis yielding 2.4 liters of pleural fluid. Electronically Signed   By: Sandi Mariscal M.D.   On: 10/20/2020 12:15   Assessment/Plan Brittney Fisher is a 59 y.o. African-American female with a known history of GERD, ongoing tobacco abuse and COPD and recently diagnosed metastatic lung cancer  1.  Compression of the superior vena cava: SVC narrowing seen on recent CT.  At this time, physical exam is essentially normal without any symptoms.  No indication for vascular intervention at this time.  We will see the patient in approximately 1 month to follow in the outpatient setting.  2.  Metastatic lung cancer: Oncology is following  3.  Tobacco abuse: We had a discussion for approximately three minutes regarding the absolute need for smoking cessation due to the deleterious nature of tobacco on the vascular system. We discussed  the tobacco use would diminish patency of any intervention, and likely significantly worsen progression of disease.   Discussed with Dr. Mayme Genta, PA-C  10/21/2020 10:58 AM  This note was created with Dragon medical transcription system.  Any error is purely unintentional

## 2020-10-22 DIAGNOSIS — E440 Moderate protein-calorie malnutrition: Secondary | ICD-10-CM

## 2020-10-22 LAB — PH, BODY FLUID: pH, Body Fluid: 7.4

## 2020-10-22 MED ORDER — BENZONATATE 100 MG PO CAPS: 100.0000 mg | ORAL_CAPSULE | Freq: Three times a day (TID) | ORAL | 0 refills | Status: DC | PRN

## 2020-10-22 MED ORDER — ENSURE ENLIVE PO LIQD
237.0000 mL | Freq: Two times a day (BID) | ORAL | 12 refills | Status: DC
Start: 2020-10-22 — End: 2021-03-22

## 2020-10-22 MED ORDER — PREDNISONE 10 MG PO TABS
40.0000 mg | ORAL_TABLET | Freq: Every day | ORAL | 0 refills | Status: DC
Start: 2020-10-22 — End: 2020-11-05

## 2020-10-22 MED ORDER — LEVOFLOXACIN 750 MG PO TABS
750.0000 mg | ORAL_TABLET | Freq: Every day | ORAL | 0 refills | Status: DC
Start: 2020-10-22 — End: 2020-11-05

## 2020-10-22 MED ORDER — GUAIFENESIN ER 600 MG PO TB12
600.0000 mg | ORAL_TABLET | Freq: Two times a day (BID) | ORAL | 0 refills | Status: DC
Start: 2020-10-22 — End: 2023-01-18

## 2020-10-22 MED ORDER — TRAMADOL HCL 50 MG PO TABS: 50.0000 mg | ORAL_TABLET | Freq: Two times a day (BID) | ORAL | 0 refills | Status: DC | PRN

## 2020-10-22 MED ORDER — IPRATROPIUM-ALBUTEROL 0.5-2.5 (3) MG/3ML IN SOLN: 3.0000 mL | Freq: Three times a day (TID) | RESPIRATORY_TRACT | 1 refills | Status: DC

## 2020-10-22 NOTE — Progress Notes (Signed)
Patient discharging home. Instructions given to patient, verbalized understanding. Family will transport patient home.

## 2020-10-22 NOTE — Discharge Summary (Signed)
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 COPD (chronic obstructive pulmonary disease) ([place])   . GERD (gastroesophageal reflux disease)     HOSPITAL COURSE:   Brittney Buchholz Fosteris a 59 y.o.female with PMH of GERD and tobacco abusebrought to the ED via EMS from home with a chief complaint of generalized weakness, dizziness, chest pain and shortness of breath. Patient is a heavy smoker who was recently diagnosed with COPD approximately 1 month ago by her PCP. Patient complains of significant weight loss of the last several months.  Right-sided pleural effusion-malignant Suspected metastatic cancer likely Lung significant weight loss -status post thoracentesis with removal of 2.4 L of fluid. Labs pending -patient seen by Dr. [name] oncology. Patient will need further workup for staging and biopsy. 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 No urgent vascular intervention needed.f/u as out pt  COPD exacerbation with hypoxia on exertion--Now on oxygen (for home) acute bronchitis ongoing tobacco abuse-- patient says she quit now -continue oxygen. -- Patient does qualify for home oxygen. Will arrange home health nebulizer also. -continue bronchodilators. -Will give a course of prednisone taper. -Empiric Levaquin for five days  Brittney Fisher continue PPI  DVT prophylaxis subcu Lovenox  Nutrition Status: Nutrition Problem: Moderate Malnutrition Etiology: chronic illness (COPD, possible metastatic primary lung neoplasm) Signs/Symptoms: moderate fat depletion, moderate muscle depletion Interventions: Ensure Enlive (each supplement provides 350kcal and 20 grams of protein)     Overall prognosis is poor. Pt will d/c to home. Agrees with plan. Dr [name] aware  Procedures: Family communication : sister in the room on 11/5 Consults : oncology, vascular surgery CODE STATUS: full code per patient DVT Prophylaxis : Lovenox  CONSULTS OBTAINED:  Treatment Team:  [name], MD  DRUG ALLERGIES:  No Known Allergies  DISCHARGE MEDICATIONS:   Allergies as of 10/22/2020   No Known Allergies     Medication List    TAKE these medications   acetaminophen 500 MG tablet Commonly known as: TYLENOL Take 1,000 mg by mouth every 8 (eight) hours as needed for moderate pain.   benzonatate 100 MG capsule Commonly known as: TESSALON Take 1 capsule (100 mg total) by mouth 3 (three) times daily as needed for cough.   feeding supplement Liqd Take 237 mLs by mouth 2 (two) times daily between meals.   guaiFENesin 600 MG 12 hr tablet Commonly known as: MUCINEX Take 1 tablet (600 mg total) by mouth 2 (two) times daily.   ipratropium-albuterol 0.5-2.5 (3) MG/3ML Soln Commonly known as: DUONEB Take  3 mLs by nebulization 3 (three) times daily.   levofloxacin  750 MG tablet Commonly known as: LEVAQUIN Take 1 tablet (750 mg total) by mouth daily.   omeprazole 20 MG capsule Commonly known as: PRILOSEC Take 20 mg by mouth daily.   predniSONE 10 MG tablet Commonly known as: DELTASONE Take 4 tablets (40 mg total) by mouth daily with breakfast. Take 40 mg daily--taper by 10 mg daily then stop   Spiriva HandiHaler 18 MCG inhalation capsule Generic drug: tiotropium Place 18 mcg into inhaler and inhale daily.   traMADol 50 MG tablet Commonly known as: ULTRAM Take 1 tablet (50 mg total) by mouth every 12 (twelve) hours as needed for moderate pain or severe pain (pain).            Durable Medical Equipment  (From admission, onward)         Start     Ordered   10/21/20 1425  For home use only DME Nebulizer machine  Once       Question Answer Comment  Patient needs a nebulizer to treat with the following condition COPD (chronic obstructive pulmonary disease) with emphysema ([place])   Length of Need Lifetime      10/21/20 1424   10/21/20 1424  For home use only DME oxygen  Once       Question Answer Comment  Length of Need Lifetime   Mode or (Route) Nasal cannula   Liters per Minute 2   Frequency Continuous (stationary and portable oxygen unit needed)   Oxygen conserving device Yes   Oxygen delivery system Gas      10/21/20 1424          If you experience worsening of your admission symptoms, develop shortness of breath, life threatening emergency, suicidal or homicidal thoughts you must seek medical attention immediately by calling 911 or calling your MD immediately  if symptoms less severe.  You Must read complete instructions/literature along with all the possible adverse reactions/side effects for all the Medicines you take and that have been prescribed to you. Take any new Medicines after you have completely understood and accept all the possible adverse reactions/side effects.   Please note  You were cared for by a  hospitalist during your hospital stay. If you have any questions about your discharge medications or the care you received while you were in the hospital after you are discharged, you can call the unit and asked to speak with the hospitalist on call if the hospitalist that took care of you is not available. Once you are discharged, your primary care physician will handle any further medical issues. Please note that NO REFILLS for any discharge medications will be authorized once you are discharged, as it is imperative that you return to your primary care physician (or establish a relationship with a primary care physician if you do not have one) for your aftercare needs so that they can reassess your need for medications and monitor your lab values. Today   SUBJECTIVE    Cough with chest pain-pleuritic VITAL SIGNS:  Blood pressure (!) 115/55, pulse (!) 103, temperature 98.2 F (36.8 C), temperature source Oral, resp. rate 20, height 5\' 11"  (1.803 m), weight 68.5 kg, SpO2 92 %.  I/O:    Intake/Output Summary (Last 24 hours) at 10/22/2020 0822 Last data filed at 10/21/2020 1419 Gross per 24 hour  Intake 100 ml  Output --  Net 100 ml    PHYSICAL EXAMINATION:  GENERAL:  59 y.o.-year-old  patient lying in the bed with no acute distress.  HEENT: Head atraumatic, normocephalic. Oropharynx and nasopharynx clear.  NECK:  Supple, no jugular venous distention. No thyroid enlargement, no tenderness. No neck vein swelling LUNGS: decreased breath sounds bilaterally, no wheezing, rales, scattered rhonchi. No use of accessory muscles of respiration.  CARDIOVASCULAR: S1, S2 normal. No murmurs, rubs, or gallops.  ABDOMEN: Soft, nontender, nondistended. Bowel sounds present. No organomegaly or mass.  EXTREMITIES: No cyanosis, clubbing or edema b/l.    NEUROLOGIC: Cranial nerves II through XII are intact. No focal Motor or sensory deficits b/l.   PSYCHIATRIC:  patient is alert and oriented x 3.  SKIN: No  obvious rash, lesion, or ulcer.  DATA REVIEW:   CBC  Recent Labs  Lab 10/21/20 0541  WBC 14.0*  HGB 11.9*  HCT 35.3*  PLT 242    Chemistries  Recent Labs  Lab 10/21/20 0541  NA 134*  K 3.9  CL 102  CO2 23  GLUCOSE 101*  BUN 13  CREATININE 0.76  CALCIUM 8.5*  AST 16  ALT 17  ALKPHOS 62  BILITOT 0.6    Microbiology Results   Recent Results (from the past 240 hour(s))  Respiratory Panel by RT PCR (Flu A&B, Covid) - Nasopharyngeal Swab     Status: None   Collection Time: 10/20/20  5:00 AM   Specimen: Nasopharyngeal Swab  Result Value Ref Range Status   SARS Coronavirus 2 by RT PCR NEGATIVE NEGATIVE Final    Comment: (NOTE) SARS-CoV-2 target nucleic acids are NOT DETECTED.  The SARS-CoV-2 RNA is generally detectable in upper respiratoy specimens during the acute phase of infection. The lowest concentration of SARS-CoV-2 viral copies this assay can detect is 131 copies/mL. A negative result does not preclude SARS-Cov-2 infection and should not be used as the sole basis for treatment or other patient management decisions. A negative result may occur with  improper specimen collection/handling, submission of specimen other than nasopharyngeal swab, presence of viral mutation(s) within the areas targeted by this assay, and inadequate number of viral copies (<131 copies/mL). A negative result must be combined with clinical observations, patient history, and epidemiological information. The expected result is Negative.  Fact Sheet for Patients:  PinkCheek.be  Fact Sheet for Healthcare Providers:  GravelBags.it  This test is no t yet approved or cleared by the [place] FDA and  has been authorized for detection and/or diagnosis of SARS-CoV-2 by FDA under an Emergency Use Authorization (EUA). This EUA will remain  in effect (meaning this test can be used) for the duration of the COVID-19 declaration  under Section 564(b)(1) of the Act, 21 U.S.C. section 360bbb-3(b)(1), unless the authorization is terminated or revoked sooner.     Influenza A by PCR NEGATIVE NEGATIVE Final   Influenza B by PCR NEGATIVE NEGATIVE Final    Comment: (NOTE) The Xpert Xpress SARS-CoV-2/FLU/RSV assay is intended as an aid in  the diagnosis of influenza from Nasopharyngeal swab specimens and  should not be used as a sole basis for treatment. Nasal washings and  aspirates are unacceptable for Xpert Xpress SARS-CoV-2/FLU/RSV  testing.  Fact Sheet for Patients: PinkCheek.be  Fact Sheet for Healthcare Providers: GravelBags.it  This test is not yet approved or cleared by the [place] FDA and  has been authorized for detection and/or diagnosis of SARS-CoV-2 by  FDA under an Emergency Use Authorization (EUA). This EUA will remain  in effect (meaning this test can be used) for the duration of the  Covid-19 declaration under Section 564(b)(1) of the Act, 21  U.S.C. section 360bbb-3(b)(1), unless the authorization is  terminated or revoked. Performed at [facility], [address] [place], [place] [address]   Culture, blood (routine x 2)     Status: None (Preliminary result)   Collection Time: 10/20/20  5:00 AM   Specimen: BLOOD  Result Value Ref Range Status   Specimen Description BLOOD LEFT ANTECUBITAL  Final   Special Requests   Final    BOTTLES DRAWN AEROBIC AND ANAEROBIC Blood Culture adequate volume   Culture   Final    NO GROWTH 2 DAYS Performed at [facility], [address], [place], [place] [address]    Report Status PENDING  Incomplete  Culture, blood (routine x 2)     Status: None (Preliminary result)   Collection Time: 10/20/20  5:00 AM   Specimen: BLOOD  Result Value Ref Range Status   Specimen Description BLOOD RIGHT ANTECUBITAL  Final   Special Requests   Final    BOTTLES DRAWN AEROBIC AND ANAEROBIC  Blood Culture results may not be optimal due to an excessive volume of blood received in culture bottles   Culture   Final    NO GROWTH 2 DAYS Performed at [facility], [address], [place], [place] [address]    Report Status PENDING  Incomplete  Body fluid culture     Status: None (Preliminary result)   Collection Time: 10/20/20 11:40 AM   Specimen: PATH Cytology Peritoneal fluid  Result Value Ref Range Status   Specimen Description   Final    PERITONEAL Performed at [facility], [address], [place], [place] [address]    Special Requests   Final    NONE Performed at [facility], [place], [place] [address]    Gram Stain NO WBC SEEN NO ORGANISMS SEEN   Final   Culture   Final    NO GROWTH < 24 HOURS Performed at [facility], [place] [address] [place], [place] [address]    Report Status PENDING  Incomplete  Acid Fast Smear (AFB)     Status: None   Collection Time: 10/20/20 11:40 AM   Specimen: PATH Cytology Peritoneal fluid  Result Value Ref Range Status   AFB Specimen Processing Concentration  Final   Acid Fast Smear Negative  Final    Comment: (NOTE) Performed At: [facility] [place], [place] [id number] Rush Farmer MD IF:[id number]    Source (AFB) PERITONEAL  Final    Comment: Performed at [facility], [address] [place], [place] [address]    RADIOLOGY:  MR BRAIN W WO CONTRAST  Result Date: 10/21/2020 CLINICAL DATA:  New diagnosis lung cancer. Rule out metastatic disease. Mental status change. EXAM: MRI HEAD WITHOUT AND WITH CONTRAST TECHNIQUE: Multiplanar, multiecho pulse sequences of the brain and surrounding structures were obtained without and with intravenous contrast. CONTRAST:  25mL GADAVIST GADOBUTROL 1 MMOL/ML IV SOLN COMPARISON:  None. FINDINGS: Brain: Negative for metastatic disease. No enhancing mass lesion in the brain. No cerebral edema identified. Negative  for acute infarct. Scattered small white matter hyperintensities consistent with chronic microvascular ischemia. Negative for hemorrhage or mass lesion. Vascular: Normal arterial flow voids Skull and upper cervical spine: No focal skeletal lesion. Sinuses/Orbits: Paranasal sinuses clear.  Negative orbit Other: None IMPRESSION: Negative for metastatic disease Mild chronic microvascular ischemic change in the white matter. Negative for acute infarct. Electronically Signed  By: Franchot Gallo M.D.   On: 10/21/2020 18:56   DG Chest Port 1 View  Result Date: 10/20/2020 CLINICAL DATA:  No known primary, now with concern for malignant right-sided pleural effusion post thoracentesis. EXAM: PORTABLE CHEST 1 VIEW COMPARISON:  Chest radiograph-earlier same day; chest CT-10/18/2020 FINDINGS: Interval reduction in persistent trace right-sided effusion post thoracentesis. No pneumothorax. Improved aeration of the right mid and lower lung with persistent right basilar consolidative opacities, likely atelectasis. Redemonstrated extensive bilateral nodular opacities with obscuration of the right heart border secondary to persistent atelectasis/collapse of the right upper lobe. No left-sided pleural effusion or pneumothorax. No evidence of edema. No acute osseous abnormalities. IMPRESSION: 1. Interval reduction in persistent trace right-sided effusion post thoracentesis. No pneumothorax. 2. Improved aeration of the right mid and lower lung with persistent right basilar opacities, likely atelectasis. 3. Similar findings of right upper lobe collapse and extensive metastatic disease involving the lungs bilaterally as better demonstrated recent chest CT. Electronically Signed   By: Sandi Mariscal M.D.   On: 10/20/2020 12:17   US THORACENTESIS ASP PLEURAL SPACE W/IMG GUIDE  Result Date: 10/20/2020 INDICATION: No known primary, now with concern for malignant right-sided pleural effusion. Please perform ultrasound-guided thoracentesis  for diagnostic and therapeutic purposes. EXAM: US THORACENTESIS ASP PLEURAL SPACE W/IMG GUIDE COMPARISON:  Chest CT-10/18/2020 MEDICATIONS: None. COMPLICATIONS: None immediate. TECHNIQUE: Informed written consent was obtained from the patient after a discussion of the risks, benefits and alternatives to treatment. A timeout was performed prior to the initiation of the procedure. Initial ultrasound scanning demonstrates a large complex right-sided pleural effusion with large amount floating echogenic debris within the pleural fluid but without definitive loculations. The lower chest was prepped and draped in the usual sterile fashion. 1% lidocaine was used for local anesthesia. An ultrasound image was saved for documentation purposes. An 8 Fr Safe-T-Centesis catheter was introduced. The thoracentesis was performed. The catheter was removed and a dressing was applied. The patient tolerated the procedure well without immediate post procedural complication. The patient was escorted to have an upright chest radiograph. FINDINGS: A total of approximately 2.4 liters of serous fluid was removed. Requested samples were sent to the laboratory. IMPRESSION: Successful ultrasound-guided right sided thoracentesis yielding 2.4 liters of pleural fluid. Electronically Signed   By: Sandi Mariscal M.D.   On: 10/20/2020 12:15     CODE STATUS:     Code Status Orders  (From admission, onward)         Start     Ordered   10/20/20 0507  Full code  Continuous        10/20/20 0511        Code Status History    This patient has a current code status but no historical code status.   Advance Care Planning Activity       TOTAL TIME TAKING CARE OF THIS PATIENT: 35 minutes.    Fritzi Mandes M.D  [facility]    CC: Primary care physician; Ranae Plumber, [place]

## 2020-10-22 NOTE — Discharge Instructions (Signed)
Use your oxygen and nebulizer per instructions

## 2020-10-22 NOTE — Plan of Care (Signed)
  Problem: Education: Goal: Knowledge of General Education information will improve Description: Including pain rating scale, medication(s)/side effects and non-pharmacologic comfort measures Outcome: Adequate for Discharge   Problem: Clinical Measurements: Goal: Will remain free from infection Outcome: Adequate for Discharge   Problem: Activity: Goal: Risk for activity intolerance will decrease Outcome: Adequate for Discharge   Problem: Elimination: Goal: Will not experience complications related to bowel motility Outcome: Adequate for Discharge Goal: Will not experience complications related to urinary retention Outcome: Adequate for Discharge   Problem: Pain Managment: Goal: General experience of comfort will improve Outcome: Adequate for Discharge   Problem: Safety: Goal: Ability to remain free from injury will improve Outcome: Adequate for Discharge   Problem: Skin Integrity: Goal: Risk for impaired skin integrity will decrease Outcome: Adequate for Discharge   Problem: Malnutrition  (NI-5.2) Goal: Food and/or nutrient delivery Description: Individualized approach for food/nutrient provision. Outcome: Adequate for Discharge

## 2020-10-23 LAB — BODY FLUID CULTURE
Culture: NO GROWTH
Gram Stain: NONE SEEN

## 2020-10-24 ENCOUNTER — Encounter: Payer: Self-pay | Admitting: *Deleted

## 2020-10-24 ENCOUNTER — Other Ambulatory Visit: Payer: Self-pay | Admitting: Oncology

## 2020-10-24 MED ORDER — LORAZEPAM 0.5 MG PO TABS: 0.5000 mg | ORAL_TABLET | ORAL | 0 refills | Status: DC

## 2020-10-24 NOTE — Progress Notes (Signed)
  Oncology Nurse Navigator Documentation  Navigator Location: [facility] Onc (10/24/20 1200)   )Navigator Encounter Type: Telephone (10/24/20 1200) Telephone: Lahoma Crocker Call (10/24/20 1200)                       Barriers/Navigation Needs: Coordination of Care (10/24/20 1200)   Interventions: Coordination of Care;Medication Assistance (10/24/20 1200)   Coordination of Care: Appts;Radiology (10/24/20 1200)       spoke with patient and her caregiver, Brittney Fisher, to review upcoming appts this week. All questions answered during call. Pt's caregiver asked if pt could get medication to help her relax for PET scan. Per Dr. [name], will send in prescription for ativan. Earl Many for pt to take prescription with her to her PET scan appt. Contact info given and instructed to call back with any further questions or needs. Pt's caregiver verbalized understanding. Nothing further needed at this time.           Time Spent with Patient: 30 (10/24/20 1200)

## 2020-10-25 ENCOUNTER — Telehealth: Payer: Self-pay | Admitting: *Deleted

## 2020-10-25 LAB — CULTURE, BLOOD (ROUTINE X 2)
Culture: NO GROWTH
Culture: NO GROWTH
Special Requests: ADEQUATE

## 2020-10-25 NOTE — Telephone Encounter (Signed)
[name] called reporting that patient is having constipation and wanted to know what to do for it. Patient has been taking Miralax. I suggested she try a dose of MOM and add Senakot twice a day to Miralax regimen. She then reported that patient is having pain behind her breast that Tramadol is not controlling at this time. She asked if patient could take Tylenol or ibuprofen for pain. I advised that she can try tylenol and if that does not help to let us know and we could see about changing her dose vs changing medicine  Her Tramadol dose is ordered for every 12 hours as needed at this time. Please advise of any orders

## 2020-10-25 NOTE — Telephone Encounter (Signed)
Agree with your advise of bowel regimen.  Agree with Tylenol, and ibuprofen. She may increase tramadol to Every 4-6 hours as needed. She has Rx of 20 tablets. I will access her pain level and will adjust pain medication during her next appt.

## 2020-10-26 NOTE — Telephone Encounter (Signed)
Call returned to Ms Brittney Fisher and advised of doctor orders. She said "alright, sounds good"

## 2020-10-27 ENCOUNTER — Other Ambulatory Visit: Payer: Self-pay

## 2020-10-27 ENCOUNTER — Inpatient Hospital Stay: Admission: RE | Admit: 2020-10-27 | Payer: BC Managed Care – PPO | Source: Ambulatory Visit

## 2020-10-27 ENCOUNTER — Ambulatory Visit
Admit: 2020-10-27 | Discharge: 2020-10-27 | Disposition: A | Discharge status: Home / Self Care | Payer: BC Managed Care – PPO | Source: Ambulatory Visit | Attending: Oncology | Admitting: Oncology

## 2020-10-27 ENCOUNTER — Telehealth: Payer: BC Managed Care – PPO | Admitting: Nurse Practitioner

## 2020-10-27 DIAGNOSIS — I313 Pericardial effusion (noninflammatory): Secondary | ICD-10-CM | POA: Diagnosis not present

## 2020-10-27 DIAGNOSIS — R918 Other nonspecific abnormal finding of lung field: Secondary | ICD-10-CM | POA: Diagnosis not present

## 2020-10-27 DIAGNOSIS — C7951 Secondary malignant neoplasm of bone: Secondary | ICD-10-CM | POA: Diagnosis not present

## 2020-10-27 DIAGNOSIS — J910 Malignant pleural effusion: Secondary | ICD-10-CM | POA: Diagnosis not present

## 2020-10-27 DIAGNOSIS — C3411 Malignant neoplasm of upper lobe, right bronchus or lung: Secondary | ICD-10-CM | POA: Insufficient documentation

## 2020-10-27 LAB — GLUCOSE, CAPILLARY: Glucose-Capillary: 91 mg/dL (ref 70–99)

## 2020-10-27 MED ORDER — FLUDEOXYGLUCOSE F - 18 (FDG) INJECTION
7.8000 | Freq: Once | INTRAVENOUS | Status: AC | PRN
  Administered 2020-10-27: 8.474 via INTRAVENOUS

## 2020-10-28 ENCOUNTER — Other Ambulatory Visit
Admission: RE | Admit: 2020-10-28 | Discharge: 2020-10-28 | Disposition: A | Discharge status: Home / Self Care | Payer: BC Managed Care – PPO | Source: Ambulatory Visit | Attending: Oncology | Admitting: Oncology

## 2020-10-28 ENCOUNTER — Other Ambulatory Visit: Payer: Self-pay | Admitting: *Deleted

## 2020-10-28 ENCOUNTER — Other Ambulatory Visit: Payer: Self-pay | Admitting: Student

## 2020-10-28 ENCOUNTER — Encounter: Payer: Self-pay | Admitting: Oncology

## 2020-10-28 ENCOUNTER — Encounter: Payer: Self-pay | Admitting: *Deleted

## 2020-10-28 ENCOUNTER — Inpatient Hospital Stay: Payer: BC Managed Care – PPO | Attending: Oncology | Admitting: Oncology

## 2020-10-28 VITALS — BP 98/84 | HR 124 | Temp 100.0°F | Resp 20 | Ht 69.69 in | Wt 144.2 lb

## 2020-10-28 DIAGNOSIS — J90 Pleural effusion, not elsewhere classified: Secondary | ICD-10-CM | POA: Diagnosis not present

## 2020-10-28 DIAGNOSIS — Z01812 Encounter for preprocedural laboratory examination: Secondary | ICD-10-CM | POA: Insufficient documentation

## 2020-10-28 DIAGNOSIS — Z803 Family history of malignant neoplasm of breast: Secondary | ICD-10-CM | POA: Diagnosis not present

## 2020-10-28 DIAGNOSIS — C782 Secondary malignant neoplasm of pleura: Secondary | ICD-10-CM | POA: Insufficient documentation

## 2020-10-28 DIAGNOSIS — I871 Compression of vein: Secondary | ICD-10-CM

## 2020-10-28 DIAGNOSIS — C3490 Malignant neoplasm of unspecified part of unspecified bronchus or lung: Secondary | ICD-10-CM | POA: Diagnosis present

## 2020-10-28 DIAGNOSIS — K219 Gastro-esophageal reflux disease without esophagitis: Secondary | ICD-10-CM | POA: Insufficient documentation

## 2020-10-28 DIAGNOSIS — R918 Other nonspecific abnormal finding of lung field: Secondary | ICD-10-CM

## 2020-10-28 DIAGNOSIS — R2240 Localized swelling, mass and lump, unspecified lower limb: Secondary | ICD-10-CM | POA: Diagnosis not present

## 2020-10-28 DIAGNOSIS — G893 Neoplasm related pain (acute) (chronic): Secondary | ICD-10-CM | POA: Diagnosis not present

## 2020-10-28 DIAGNOSIS — Z800 Family history of malignant neoplasm of digestive organs: Secondary | ICD-10-CM | POA: Diagnosis not present

## 2020-10-28 DIAGNOSIS — Z20822 Contact with and (suspected) exposure to covid-19: Secondary | ICD-10-CM | POA: Insufficient documentation

## 2020-10-28 DIAGNOSIS — J449 Chronic obstructive pulmonary disease, unspecified: Secondary | ICD-10-CM | POA: Diagnosis not present

## 2020-10-28 DIAGNOSIS — E538 Deficiency of other specified B group vitamins: Secondary | ICD-10-CM | POA: Diagnosis present

## 2020-10-28 DIAGNOSIS — F17210 Nicotine dependence, cigarettes, uncomplicated: Secondary | ICD-10-CM | POA: Insufficient documentation

## 2020-10-28 DIAGNOSIS — R634 Abnormal weight loss: Secondary | ICD-10-CM | POA: Insufficient documentation

## 2020-10-28 DIAGNOSIS — Z7189 Other specified counseling: Secondary | ICD-10-CM | POA: Diagnosis not present

## 2020-10-28 MED ORDER — TRAMADOL HCL 50 MG PO TABS: 50.0000 mg | ORAL_TABLET | Freq: Four times a day (QID) | ORAL | 0 refills | Status: DC | PRN

## 2020-10-28 MED ORDER — BENZONATATE 100 MG PO CAPS: 100.0000 mg | ORAL_CAPSULE | Freq: Three times a day (TID) | ORAL | 1 refills | Status: DC | PRN

## 2020-10-28 NOTE — Progress Notes (Signed)
  Oncology Nurse Navigator Documentation  Navigator Location: [facility] (10/28/20 1200)   )Navigator Encounter Type: Follow-up Appt (10/28/20 1200)                     Patient Visit Type: MedOnc (10/28/20 1200)   Barriers/Navigation Needs: Coordination of Care;Pain (10/28/20 1200)   Interventions: Coordination of Care;Disability/FMLA (10/28/20 1200)   Coordination of Care: Appts;Radiology (10/28/20 1200)        Acuity: Level 2-Minimal Needs (1-2 Barriers Identified) (10/28/20 1200)    met with patient and her caregiver, Brittney Fisher, during follow up visit with Dr. [name] this morning. All questions answered during visit. FMLA papers received and will be completed this afternoon for pick up. Informed that will she will be called with follow up appts once she is scheduled for her biopsy. Contact info given and instructed to call with any further questions or needs. Pt and her caregiver verbalized understanding.      Time Spent with Patient: 30 (10/28/20 1200)

## 2020-10-28 NOTE — Progress Notes (Signed)
Patient here for hospital f/u.  Reports right side chest pain that is worsened by movement with 10/10 on pain scale.  Was prescribed Tramadol during hospital admission but took the last dose yesterday so has not taken anything for pain today.  No appetite with reported 40 lb wt loss since 04/2020.

## 2020-10-28 NOTE — Progress Notes (Signed)
Hematology/Oncology Follow Up Note [facility]  Telephone:([phone] [phone]  Patient Care Team: Ranae Plumber, [place] as PCP - General (Family Medicine) Telford Nab, RN as Oncology Nurse Navigator   Name of the patient: Brittney Fisher  [id number]  05/05/1961   REASON FOR VISIT  follow-up for lung mass pleural effusion  INTERVAL HISTORY  59 y.o. female with past medical history including GERD, COPD, current everyday smoker presents for follow-up of lung mass and pleural effusion. 10/20/2020, patient was brought to ED via EMS due to generalized weakness, dizziness, chest pain shortness of breath.  She was recently diagnosed with COPD approximately 1 month ago by primary care provider.  Also unintentional weight loss during the last few months. Image work-up showed right-sided pleural effusion.  She underwent right thoracentesis.  With removal of 2.4 L of fluid. 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  Metastatic disease to low cervical/thoracic nodes, lung, right pleural space and bones.  Moderate to large right pleural effusion.  SVC narrowing. 10/21/2020 MRI brain is negative for metastasis.  Mild chronic microvascular ischemic changes in the white matter.  Negative for acute infarct Regarding to the SVC narrowing, patient was seen by vascular surgeon and was recommended no intervention inpatient.  Patient to follow-up outpatient with vascular surgeon for evaluation. Patient also was treated for COPD exacerbation with hypoxia on exertion. Qualifies for home oxygen and hospitalist arrange home health and a nebulizer.  Patient was given a course of prednisone taper and empiric Levaquin. Patient was discharged and present today to follow-up with cytology results and further management plan  10/27/2020 PET scan showed right upper lobe primary bronchogenic mass with  direct invasion into mediastinum. Metastatic disease to the right pleural space, lung, bone, nodes of the chest and less so lower leg/upper abdomen. Hypermetabolic him along the course of the right axillary vein with concurrent subtle hyper attenuation within the axillary vein and SVC.  This continues to the level of SVC narrowing.  Suspicious for SVC occlusion and developing thrombus. Moderate right pleural effusion and small pericardial effusion.  Today patient was accompanied by her daughter Brittney Fisher.  She denies any facial swelling.  Shortness of breath has improved after thoracentesis, still shortness of breath with exertion.  Feel tired and fatigued. Patient reports pain of the right side chest wall for which she takes tramadol 50 mg every 6 hours with relief. Continues to have chronic cough.  Review of Systems  Constitutional: Positive for appetite change, fatigue and unexpected weight change. Negative for chills and fever.  HENT:   Negative for hearing loss and voice change.   Eyes: Negative for eye problems.  Respiratory: Positive for cough and shortness of breath. Negative for chest tightness.   Cardiovascular: Negative for chest pain.  Gastrointestinal: Negative for abdominal distention, abdominal pain and blood in stool.  Endocrine: Negative for hot flashes.  Genitourinary: Negative for difficulty urinating and frequency.   Musculoskeletal: Negative for arthralgias.       Right side chest wall pain  Skin: Negative for itching and rash.  Neurological: Negative for extremity weakness.  Hematological: Negative for adenopathy.  Psychiatric/Behavioral: Negative for confusion.      No Known Allergies   Past Medical History:  Diagnosis Date  . COPD (chronic obstructive pulmonary disease) ([place])   . GERD (gastroesophageal reflux disease)      Past Surgical History:  Procedure Laterality Date  . COLONOSCOPY WITH ESOPHAGOGASTRODUODENOSCOPY (  EGD)    . COLONOSCOPY WITH PROPOFOL  N/A 11/14/2015   Procedure: COLONOSCOPY WITH PROPOFOL;  Surgeon: Hulen Luster, MD;  Location: [facility] ENDOSCOPY;  Service: Gastroenterology;  Laterality: N/A;  . ESOPHAGOGASTRODUODENOSCOPY (EGD) WITH PROPOFOL N/A 11/14/2015   Procedure: ESOPHAGOGASTRODUODENOSCOPY (EGD) WITH PROPOFOL;  Surgeon: Hulen Luster, MD;  Location: [facility] ENDOSCOPY;  Service: Gastroenterology;  Laterality: N/A;    Social History   Socioeconomic History  . Marital status: Married    Spouse name: Not on file  . Number of children: Not on file  . Years of education: Not on file  . Highest education level: Not on file  Occupational History  . Not on file  Tobacco Use  . Smoking status: Current Every Day Smoker    Packs/day: 0.20    Types: Cigarettes  . Smokeless tobacco: Never Used  Substance and Sexual Activity  . Alcohol use: Never  . Drug use: Never  . Sexual activity: Not Currently  Other Topics Concern  . Not on file  Social History Narrative  . Not on file   Social Determinants of Health   Financial Resource Strain:   . Difficulty of Paying Living Expenses: Not on file  Food Insecurity:   . Worried About Charity fundraiser in the Last Year: Not on file  . Ran Out of Food in the Last Year: Not on file  Transportation Needs:   . Lack of Transportation (Medical): Not on file  . Lack of Transportation (Non-Medical): Not on file  Physical Activity:   . Days of Exercise per Week: Not on file  . Minutes of Exercise per Session: Not on file  Stress:   . Feeling of Stress : Not on file  Social Connections:   . Frequency of Communication with Friends and Family: Not on file  . Frequency of Social Gatherings with Friends and Family: Not on file  . Attends Religious Services: Not on file  . Active Member of Clubs or Organizations: Not on file  . Attends Archivist Meetings: Not on file  . Marital Status: Not on file  Intimate Partner Violence:   . Fear of Current or Ex-Partner: Not on file  .  Emotionally Abused: Not on file  . Physically Abused: Not on file  . Sexually Abused: Not on file    Family History  Problem Relation Age of Onset  . Breast cancer Cousin 63  . Diabetes type II Mother   . Hypertension Mother   . Colon cancer Father   . Hypertension Father      Current Outpatient Medications:  .  acetaminophen (TYLENOL) 500 MG tablet, Take 1,000 mg by mouth every 8 (eight) hours as needed for moderate pain., Disp: , Rfl:  .  benzonatate (TESSALON) 100 MG capsule, Take 1 capsule (100 mg total) by mouth 3 (three) times daily as needed for cough., Disp: 60 capsule, Rfl: 1 .  feeding supplement (ENSURE ENLIVE / ENSURE PLUS) LIQD, Take 237 mLs by mouth 2 (two) times daily between meals., Disp: 237 mL, Rfl: 12 .  guaiFENesin (MUCINEX) 600 MG 12 hr tablet, Take 1 tablet (600 mg total) by mouth 2 (two) times daily., Disp: 14 tablet, Rfl: 0 .  ipratropium-albuterol (DUONEB) 0.5-2.5 (3) MG/3ML SOLN, Take 3 mLs by nebulization 3 (three) times daily., Disp: 360 mL, Rfl: 1 .  levofloxacin (LEVAQUIN) 750 MG tablet, Take 1 tablet (750 mg total) by mouth daily., Disp: 5 tablet, Rfl: 0 .  omeprazole (PRILOSEC) 20 MG  capsule, Take 20 mg by mouth daily., Disp: , Rfl:  .  predniSONE (DELTASONE) 10 MG tablet, Take 4 tablets (40 mg total) by mouth daily with breakfast. Take 40 mg daily--taper by 10 mg daily then stop, Disp: 10 tablet, Rfl: 0 .  tiotropium (SPIRIVA HANDIHALER) 18 MCG inhalation capsule, Place 18 mcg into inhaler and inhale daily., Disp: , Rfl:  .  LORazepam (ATIVAN) 0.5 MG tablet, Take 1 tablet (0.5 mg total) by mouth See admin instructions. Take 1 tablet 30 minutes prior to PET scan. May repeat another dose if anxiety is not improved. Avoid driving after taking. (Patient not taking: Reported on 10/28/2020), Disp: 3 tablet, Rfl: 0 .  traMADol (ULTRAM) 50 MG tablet, Take 1 tablet (50 mg total) by mouth every 6 (six) hours as needed for moderate pain or severe pain (pain)., Disp: 60  tablet, Rfl: 0  Physical exam:  Vitals:   10/28/20 0941  BP: 98/84  Pulse: (!) 124  Resp: 20  Temp: 100 F (37.8 C)  SpO2: 98%  Weight: 144 lb 3.2 oz (65.4 kg)  Height: 5' 9.69" (1.77 m)   Physical Exam Constitutional:      General: She is not in acute distress.    Comments: Thin built female walks independently  HENT:     Head: Normocephalic and atraumatic.  Eyes:     General: No scleral icterus. Cardiovascular:     Rate and Rhythm: Normal rate and regular rhythm.     Heart sounds: Normal heart sounds.  Pulmonary:     Effort: Pulmonary effort is normal. No respiratory distress.     Breath sounds: No wheezing.     Comments: Absent breath sounds in right lower lung Abdominal:     General: Bowel sounds are normal. There is no distension.     Palpations: Abdomen is soft.  Musculoskeletal:        General: No deformity. Normal range of motion.     Cervical back: Normal range of motion and neck supple.  Skin:    General: Skin is warm and dry.     Findings: No erythema or rash.  Neurological:     Mental Status: She is alert and oriented to person, place, and time. Mental status is at baseline.     Cranial Nerves: No cranial nerve deficit.     Coordination: Coordination normal.  Psychiatric:        Mood and Affect: Mood normal.     CMP Latest Ref Rng & Units 10/21/2020  Glucose 70 - 99 mg/dL 101(H)  BUN 8 - 23 mg/dL 13  Creatinine 0.44 - 1.00 mg/dL 0.76  Sodium 135 - 145 mmol/L 134(L)  Potassium 3.5 - 5.1 mmol/L 3.9  Chloride 98 - 111 mmol/L 102  CO2 22 - 32 mmol/L 23  Calcium 8.9 - 10.3 mg/dL 8.5(L)  Total Protein 6.5 - 8.1 g/dL 6.4(L)  Total Bilirubin 0.3 - 1.2 mg/dL 0.6  Alkaline Phos 38 - 126 U/L 62  AST 15 - 41 U/L 16  ALT 0 - 44 U/L 17   CBC Latest Ref Rng & Units 10/21/2020  WBC 4.0 - 10.5 K/uL 14.0(H)  Hemoglobin 12.0 - 15.0 g/dL 11.9(L)  Hematocrit 36 - 46 % 35.3(L)  Platelets 150 - 400 K/uL 242    RADIOGRAPHIC STUDIES: I have personally reviewed  the radiological images as listed and agreed with the findings in the report. DG Chest 2 View  Result Date: 10/05/2020 CLINICAL DATA:  59 year old female with history of  COPD exacerbation. Possible pneumonia. EXAM: CHEST - 2 VIEW COMPARISON:  No priors. FINDINGS: Multiple pulmonary nodules are noted in the lungs bilaterally, concerning for metastatic disease. Opacity in the right base which may reflect atelectasis and/or consolidation as well as superimposed moderate to large right pleural effusion. No left pleural effusion. No evidence of pulmonary edema. Heart size is normal. Upper mediastinal contours are within normal limits. IMPRESSION: 1. Findings are highly concerning for widespread metastatic disease to the chest, likely with a malignant right pleural effusion. Further evaluation with contrast enhanced chest CT is strongly recommended in the immediate future to better evaluate these findings. These results will be called to the ordering clinician or representative by the Radiologist Assistant, and communication documented in the PACS or [employer]. Electronically Signed   By: Vinnie Langton M.D.   On: 10/05/2020 16:58   CT CHEST W CONTRAST  Result Date: 10/18/2020 CLINICAL DATA:  Malignant pleural effusion. Recently diagnosed with COPD. Abnormal chest radiograph. EXAM: CT CHEST WITH CONTRAST TECHNIQUE: Multidetector CT imaging of the chest was performed during intravenous contrast administration. CONTRAST:  79mL ISOVUE-300 IOPAMIDOL (ISOVUE-300) INJECTION 61% COMPARISON:  10/05/2020 chest radiograph. FINDINGS: Cardiovascular: Advanced aortic and branch vessel atherosclerosis. Tortuous thoracic aorta. Normal heart size with minimal pericardial fluid. Left upper lobe pulmonary vein is surrounded and narrowed by adenopathy on 70/2. Pulmonary artery branches are also narrowed by adenopathy and the central right upper lobe lung mass. No pulmonary artery filling defect. SVC narrowing including on  60/2 and coronal image 62. Mediastinum/Nodes: Right supraclavicular/low jugular 1.5 cm node on 09/02. Massive thoracic adenopathy. Pretracheal mass/adenopathy measures 3.7 cm on 55/2. Subcarinal adenopathy measures 2.1 cm on 75/2. Extensive left hilar and infrahilar adenopathy, including a nodal mass of 1.6 x 3.7 cm on 70/2. Prevascular adenopathy at 1.7 cm on 64/2. Juxta diaphragmatic left-sided nodes or nodules including at 9 mm on 147/2. Lungs/Pleura: Moderate to large right pleural effusion. Extensive irregular soft tissue thickening within the right pleural space, including on 83/2, consistent with pleural metastasis. Anterior right pleural implant measures 5.9 cm maximally on 122/2. Mild tracheal deviation to the right. Right upper lobe bronchial obstruction on 62/3. Right middle lobe endobronchial obstruction 80/3. Significant mass-effect upon right lower lobe bronchi with near complete obstruction. A central right upper lobe lung mass with direct mediastinal invasion is most apparent on soft tissue windows, including at 7.1 x 6.4 cm on 57/2. A combination of postobstructive and compressive atelectasis involves the majority of the remainder of the right lung. Extensive bilateral pulmonary nodules/metastasis. Index left upper lobe 1.0 cm nodule on 103/3. Index left lower lobe pulmonary nodule measures 1.0 cm on 111/3. Right lower lobe lung nodule of 11 mm on 110/3. Upper Abdomen: Motion degradation in the upper abdomen. Normal imaged portions of the liver, spleen, pancreas, gallbladder. Left greater than right, relatively mild adrenal thickening and nodularity. Proximal gastric underdistention. Upper pole 1.3 cm left renal cyst. Interpolar right renal too small to characterize lesion. Abdominal aortic atherosclerosis. Musculoskeletal: Multiple sclerotic foci, including within the sternal manubrium on sagittal image 97. Lytic lesion within the T2 vertebral body measures 11 mm on sagittal image 99. IMPRESSION:  1. 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 2. Metastatic disease to low cervical/thoracic nodes, lungs, right pleural space, and bones. 3. Moderate to large right pleural effusion. 4. SVC narrowing, suggesting impending SVC syndrome. Consider relatively urgent multidisciplinary thoracic oncology consultation. These results will be called to the ordering clinician or representative by the  Psychologist, clinical, and communication documented in the PACS or [employer]. Electronically Signed   By: Abigail Miyamoto M.D.   On: 10/18/2020 16:42   MR BRAIN W WO CONTRAST  Result Date: 10/21/2020 CLINICAL DATA:  New diagnosis lung cancer. Rule out metastatic disease. Mental status change. EXAM: MRI HEAD WITHOUT AND WITH CONTRAST TECHNIQUE: Multiplanar, multiecho pulse sequences of the brain and surrounding structures were obtained without and with intravenous contrast. CONTRAST:  16mL GADAVIST GADOBUTROL 1 MMOL/ML IV SOLN COMPARISON:  None. FINDINGS: Brain: Negative for metastatic disease. No enhancing mass lesion in the brain. No cerebral edema identified. Negative for acute infarct. Scattered small white matter hyperintensities consistent with chronic microvascular ischemia. Negative for hemorrhage or mass lesion. Vascular: Normal arterial flow voids Skull and upper cervical spine: No focal skeletal lesion. Sinuses/Orbits: Paranasal sinuses clear.  Negative orbit Other: None IMPRESSION: Negative for metastatic disease Mild chronic microvascular ischemic change in the white matter. Negative for acute infarct. Electronically Signed   By: Franchot Gallo M.D.   On: 10/21/2020 18:56   NM PET Image Initial (PI) Skull Base To Thigh  Result Date: 10/28/2020 CLINICAL DATA:  Initial treatment strategy for right upper lobe primary bronchogenic carcinoma. Malignant pleural effusion. EXAM: NUCLEAR MEDICINE PET SKULL BASE TO THIGH TECHNIQUE: 8.5 mCi F-18 FDG was injected  intravenously. Full-ring PET imaging was performed from the skull base to thigh after the radiotracer. CT data was obtained and used for attenuation correction and anatomic localization. Fasting blood glucose: 91 mg/dl COMPARISON:  Chest CT 10/18/2020 FINDINGS: Mediastinal blood pool activity: SUV max 1.9 Liver activity: SUV max Not applicable. NECK: Hypermetabolic low right jugular/supraclavicular node measures 1.5 cm and a S.U.V. max of 7.0 on 59/3. CT findings: none CHEST: Central right upper lobe lung mass with direct mediastinal invasion. This measured 7.1 x 6.4 cm on prior diagnostic CT and a S.U.V. max of 9.6 on 86/3 today. Extensive right-sided pleural metastasis. The index implant on the prior CT corresponds to hypermetabolism, including at a S.U.V. max of 7.4 on 130/3 today. Left suprahilar node measures 1.9 cm and a S.U.V. max of 8.6 on 87/3. Left infrahilar adenopathy at 1.6 cm and a S.U.V. max of 8.1 on 107/3. Extensive hypermetabolic pulmonary metastasis. Example in the left lower lobe at 1.7 cm and a S.U.V. max of 4.4 on 117/3. Hypermetabolism along the course of the right axillary vein, including at a S.U.V. max of 5.5 on 65/3. The vein is subtly hyperattenuating, and followed centrally to the level of SVC narrowing, including on 73/3. Incidental CT findings: Deferred to recent diagnostic CT. Moderate right pleural effusion is not significantly changed. Similar small pericardial effusion. ABDOMEN/PELVIS: A node adjacent to the right adrenal measures 5 mm and a S.U.V. max of 4.8 on 151/3. No abdominopelvic parenchymal hypermetabolism identified. Incidental CT findings: Abdominal aortic atherosclerosis. Bilateral adrenal thickening suggesting hyperplasia. Large colonic stool burden. SKELETON: Multifocal osseous metastasis. Index left posterior iliac lesion measures 8 mm and a S.U.V. max of 8.8 on 203/3. T2 lytic lesion is eccentric left at 11 mm and a S.U.V. max of 10.1 on 60/3. Incidental CT findings:  none IMPRESSION: 1. Right upper lobe primary bronchogenic carcinoma with direct mediastinal invasion. 2. Metastatic disease to right pleural space, lungs, bones, nodes of the chest and less so lower neck/upper abdomen. 3. Hypermetabolism along the course of the right axillary vein with concurrent subtle hyperattenuation within the axillary vein and SVC. This continues to the level of SVC narrowing, suspicious for SVC occlusion and  developing thrombus. Recommend clinical correlation for SVC syndrome. 4. Moderate right pleural effusion and small pericardial effusion, similar. Electronically Signed   By: Abigail Miyamoto M.D.   On: 10/28/2020 09:55   DG Chest Port 1 View  Result Date: 10/20/2020 CLINICAL DATA:  No known primary, now with concern for malignant right-sided pleural effusion post thoracentesis. EXAM: PORTABLE CHEST 1 VIEW COMPARISON:  Chest radiograph-earlier same day; chest CT-10/18/2020 FINDINGS: Interval reduction in persistent trace right-sided effusion post thoracentesis. No pneumothorax. Improved aeration of the right mid and lower lung with persistent right basilar consolidative opacities, likely atelectasis. Redemonstrated extensive bilateral nodular opacities with obscuration of the right heart border secondary to persistent atelectasis/collapse of the right upper lobe. No left-sided pleural effusion or pneumothorax. No evidence of edema. No acute osseous abnormalities. IMPRESSION: 1. Interval reduction in persistent trace right-sided effusion post thoracentesis. No pneumothorax. 2. Improved aeration of the right mid and lower lung with persistent right basilar opacities, likely atelectasis. 3. Similar findings of right upper lobe collapse and extensive metastatic disease involving the lungs bilaterally as better demonstrated recent chest CT. Electronically Signed   By: Sandi Mariscal M.D.   On: 10/20/2020 12:17   DG Chest Port 1 View  Result Date: 10/20/2020 CLINICAL DATA:  Shortness of breath  EXAM: PORTABLE CHEST 1 VIEW COMPARISON:  10/04/2020.  Chest CT from 2 days ago FINDINGS: Large right pleural effusion obscuring most of the right lung. Reticulonodular opacity on the left due to multiple pulmonary nodules. Normal heart size. Thoracic adenopathy by CT. IMPRESSION: Extensive intrathoracic malignancy by recent chest CT. Very large right pleural effusion. Electronically Signed   By: Monte Fantasia M.D.   On: 10/20/2020 04:41   US THORACENTESIS ASP PLEURAL SPACE W/IMG GUIDE  Result Date: 10/20/2020 INDICATION: No known primary, now with concern for malignant right-sided pleural effusion. Please perform ultrasound-guided thoracentesis for diagnostic and therapeutic purposes. EXAM: US THORACENTESIS ASP PLEURAL SPACE W/IMG GUIDE COMPARISON:  Chest CT-10/18/2020 MEDICATIONS: None. COMPLICATIONS: None immediate. TECHNIQUE: Informed written consent was obtained from the patient after a discussion of the risks, benefits and alternatives to treatment. A timeout was performed prior to the initiation of the procedure. Initial ultrasound scanning demonstrates a large complex right-sided pleural effusion with large amount floating echogenic debris within the pleural fluid but without definitive loculations. The lower chest was prepped and draped in the usual sterile fashion. 1% lidocaine was used for local anesthesia. An ultrasound image was saved for documentation purposes. An 8 Fr Safe-T-Centesis catheter was introduced. The thoracentesis was performed. The catheter was removed and a dressing was applied. The patient tolerated the procedure well without immediate post procedural complication. The patient was escorted to have an upright chest radiograph. FINDINGS: A total of approximately 2.4 liters of serous fluid was removed. Requested samples were sent to the laboratory. IMPRESSION: Successful ultrasound-guided right sided thoracentesis yielding 2.4 liters of pleural fluid. Electronically Signed   By: Sandi Mariscal M.D.   On: 10/20/2020 12:15     Assessment and plan  1. Lung mass   2. Weight loss   3. Pleural effusion   4. Goals of care, counseling/discussion   5. SVC obstruction   6. Neoplasm related pain    #Lung mass, Pleural effusion cytology showed negative for malignancy. CT images, PET scan images MRI images were independently reviewed by me and discussed with patient. Clinically consistent with metastatic primary bronchogenic lung cancer, cT4 N3 M1 Discussed with patient about ultrasound guided core biopsy of right supraclavicular lymph  node to establish tissue diagnosis. Depending on the final pathology will determine patient's chemotherapy regimen.  Patient understands that she most likely will need to start systemic chemotherapy plus minus immunotherapy.  We will send pathology for molecular studies to see if she has any targetable mutation.  #Neoplasm pain, continue tramadol 50 mg every 6 hours as needed.  I will titrate regimen up according to her pain level #Unintentional weight loss, referral to nutritionist.  Recommend nutrition supplementation. #Right pleural effusion, will arrange patient to have additional thoracentesis. #Goals of care, discussed with patient that her disease likely incurable.  Treatment is with palliative intent.  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  She has no SVC obstruction symptoms..  Follow-up with vascular surgery. Appreciated recommendation upon Mediport placement.   Orders Placed This Encounter  Procedures  . US THORACENTESIS ASP PLEURAL SPACE W/IMG GUIDE    Standing Status:   Future    Standing Expiration Date:   10/28/2021    Order Specific Question:   Are labs required for specimen collection?    Answer:   Yes    Order Specific Question:   Lab orders requested (DO NOT place separate lab orders, these will be automatically ordered during procedure specimen collection):    Answer:   Cytology  - Non Pap    Order Specific Question:   Reason for Exam (SYMPTOM  OR DIAGNOSIS REQUIRED)    Answer:   plueral effusion    Order Specific Question:   Preferred imaging location?    Answer:   [facility]  . Amb Referral to Palliative Care    Referral Priority:   Routine    Referral Type:   Consultation    Number of Visits Requested:   1  . Amb Referral to Nutrition and Diabetic E    Referral Priority:   Routine    Referral Type:   Consultation    Referral Reason:   Specialty Services Required    Number of Visits Requested:   1      Follow-up after biopsy.  [name], MD, PhD Hematology Oncology [facility] Pager- [phone] 10/28/2020

## 2020-10-29 LAB — SARS CORONAVIRUS 2 (TAT 6-24 HRS): SARS Coronavirus 2: NEGATIVE

## 2020-10-31 ENCOUNTER — Ambulatory Visit
Admission: RE | Admit: 2020-10-31 | Discharge: 2020-10-31 | Disposition: A | Discharge status: Home / Self Care | Payer: BC Managed Care – PPO | Source: Ambulatory Visit | Attending: Interventional Radiology | Admitting: Interventional Radiology

## 2020-10-31 ENCOUNTER — Ambulatory Visit
Admission: RE | Admit: 2020-10-31 | Discharge: 2020-10-31 | Disposition: A | Discharge status: Home / Self Care | Payer: BC Managed Care – PPO | Source: Ambulatory Visit | Attending: Oncology | Admitting: Oncology

## 2020-10-31 ENCOUNTER — Other Ambulatory Visit (HOSPITAL_COMMUNITY): Payer: Self-pay | Admitting: Interventional Radiology

## 2020-10-31 ENCOUNTER — Other Ambulatory Visit: Payer: Self-pay

## 2020-10-31 ENCOUNTER — Ambulatory Visit: Payer: BC Managed Care – PPO

## 2020-10-31 DIAGNOSIS — J910 Malignant pleural effusion: Secondary | ICD-10-CM | POA: Diagnosis not present

## 2020-10-31 DIAGNOSIS — C771 Secondary and unspecified malignant neoplasm of intrathoracic lymph nodes: Secondary | ICD-10-CM | POA: Insufficient documentation

## 2020-10-31 DIAGNOSIS — R918 Other nonspecific abnormal finding of lung field: Secondary | ICD-10-CM

## 2020-10-31 DIAGNOSIS — C3490 Malignant neoplasm of unspecified part of unspecified bronchus or lung: Secondary | ICD-10-CM | POA: Diagnosis not present

## 2020-10-31 DIAGNOSIS — Z9889 Other specified postprocedural states: Secondary | ICD-10-CM

## 2020-10-31 DIAGNOSIS — R591 Generalized enlarged lymph nodes: Secondary | ICD-10-CM

## 2020-10-31 DIAGNOSIS — J90 Pleural effusion, not elsewhere classified: Secondary | ICD-10-CM

## 2020-10-31 NOTE — Discharge Instructions (Signed)

## 2020-11-01 LAB — CYTOLOGY - NON PAP

## 2020-11-02 ENCOUNTER — Other Ambulatory Visit: Payer: Self-pay | Admitting: Oncology

## 2020-11-02 ENCOUNTER — Encounter: Payer: Self-pay | Admitting: Oncology

## 2020-11-02 ENCOUNTER — Encounter: Payer: BC Managed Care – PPO | Admitting: Hospice and Palliative Medicine

## 2020-11-02 DIAGNOSIS — C3490 Malignant neoplasm of unspecified part of unspecified bronchus or lung: Secondary | ICD-10-CM | POA: Insufficient documentation

## 2020-11-02 HISTORY — DX: Malignant neoplasm of unspecified part of unspecified bronchus or lung: C34.90

## 2020-11-02 MED ORDER — DEXAMETHASONE 4 MG PO TABS: ORAL_TABLET | ORAL | 1 refills | Status: DC

## 2020-11-02 MED ORDER — OXYCODONE HCL 5 MG PO TABS
5.0000 mg | ORAL_TABLET | Freq: Four times a day (QID) | ORAL | 0 refills | Status: DC | PRN
Start: 2020-11-02 — End: 2020-11-03

## 2020-11-02 MED ORDER — FOLIC ACID 1 MG PO TABS: 1.0000 mg | ORAL_TABLET | Freq: Every day | ORAL | 3 refills | Status: DC

## 2020-11-02 MED ORDER — ONDANSETRON HCL 8 MG PO TABS: 8.0000 mg | ORAL_TABLET | Freq: Two times a day (BID) | ORAL | 1 refills | Status: DC | PRN

## 2020-11-02 MED ORDER — PROCHLORPERAZINE MALEATE 10 MG PO TABS: 10.0000 mg | ORAL_TABLET | Freq: Four times a day (QID) | ORAL | 1 refills | Status: DC | PRN

## 2020-11-02 NOTE — Progress Notes (Signed)
START ON PATHWAY REGIMEN - Non-Small Cell Lung     A cycle is every 21 days:     Pemetrexed      Carboplatin      Bevacizumab-xxxx   **Always confirm dose/schedule in your pharmacy ordering system**  Patient Characteristics: Stage IV Metastatic, Nonsquamous, Initial Chemotherapy/Immunotherapy, PS = 0, 1, ALK or EGFR or ROS1 or NTRK or MET or RET Genomic Alterations - Awaiting Test Results Therapeutic Status: Stage IV Metastatic Histology: Nonsquamous Cell ROS1 Rearrangement Status: Awaiting Test Results Other Mutations/Biomarkers: Awaiting Test Results Chemotherapy/Immunotherapy LOT: Initial Chemotherapy/Immunotherapy Molecular Targeted Therapy: Not Appropriate KRAS G12C Mutation Status: Awaiting Test Results MET Exon 14 Mutation Status: Awaiting Test Results RET Gene Fusion Status: Awaiting Test Results EGFR Mutation Status: Awaiting Test Results NTRK Gene Fusion Status: Awaiting Test Results PD-L1 Expression Status: Awaiting Test Results ALK Rearrangement Status: Awaiting Test Results BRAF V600E Mutation Status: Awaiting Test Results ECOG Performance Status: 1 Biomarker Assessment Status Confirmation: Awaiting genomic biomarker test(s) results and need to start chemotherapy Intent of Therapy: Non-Curative / Palliative Intent, Discussed with Patient 

## 2020-11-03 ENCOUNTER — Other Ambulatory Visit: Payer: Self-pay | Admitting: *Deleted

## 2020-11-03 MED ORDER — OXYCODONE HCL 5 MG PO TABS: 5.0000 mg | ORAL_TABLET | Freq: Four times a day (QID) | ORAL | 0 refills | Status: DC | PRN

## 2020-11-03 NOTE — Telephone Encounter (Signed)
Per [facility] - dispensed 7 day supply (#28) oxycodone thinking pt was being treated for acute pain and didn't realize that prescription was sent to treat cancer-related pain. Original prescription sent yesterday was voided after filling. Pharmacy requesting prescription be sent in for the remainder of the original prescription so pt can have original qty of #60.   New prescription of oxycodone to be sent to pharmacy for qty #32.

## 2020-11-04 ENCOUNTER — Inpatient Hospital Stay (HOSPITAL_BASED_OUTPATIENT_CLINIC_OR_DEPARTMENT_OTHER): Payer: BC Managed Care – PPO | Admitting: Oncology

## 2020-11-04 ENCOUNTER — Encounter: Payer: Self-pay | Admitting: Hospice and Palliative Medicine

## 2020-11-04 ENCOUNTER — Inpatient Hospital Stay (HOSPITAL_BASED_OUTPATIENT_CLINIC_OR_DEPARTMENT_OTHER): Payer: BC Managed Care – PPO | Admitting: Hospice and Palliative Medicine

## 2020-11-04 ENCOUNTER — Encounter: Payer: Self-pay | Admitting: Oncology

## 2020-11-04 ENCOUNTER — Encounter: Payer: Self-pay | Admitting: *Deleted

## 2020-11-04 VITALS — BP 103/61 | HR 100 | Temp 100.0°F | Wt 147.9 lb

## 2020-11-04 DIAGNOSIS — R634 Abnormal weight loss: Secondary | ICD-10-CM

## 2020-11-04 DIAGNOSIS — C3490 Malignant neoplasm of unspecified part of unspecified bronchus or lung: Secondary | ICD-10-CM

## 2020-11-04 DIAGNOSIS — J90 Pleural effusion, not elsewhere classified: Secondary | ICD-10-CM

## 2020-11-04 DIAGNOSIS — Z7189 Other specified counseling: Secondary | ICD-10-CM

## 2020-11-04 DIAGNOSIS — Z515 Encounter for palliative care: Secondary | ICD-10-CM | POA: Diagnosis not present

## 2020-11-04 DIAGNOSIS — G893 Neoplasm related pain (acute) (chronic): Secondary | ICD-10-CM

## 2020-11-04 LAB — SURGICAL PATHOLOGY

## 2020-11-04 MED ORDER — LIDOCAINE-PRILOCAINE 2.5-2.5 % EX CREA: TOPICAL_CREAM | CUTANEOUS | 3 refills | Status: DC

## 2020-11-04 NOTE — Patient Instructions (Signed)
Bevacizumab injection What is this medicine? BEVACIZUMAB (be va SIZ yoo mab) is a monoclonal antibody. It is used to treat many types of cancer. This medicine may be used for other purposes; ask your health care provider or pharmacist if you have questions. COMMON BRAND NAME(S): Avastin, MVASI, Zirabev What should I tell my health care provider before I take this medicine? They need to know if you have any of these conditions:  diabetes  heart disease  high blood pressure  history of coughing up blood  prior anthracycline chemotherapy (e.g., doxorubicin, daunorubicin, epirubicin)  recent or ongoing radiation therapy  recent or planning to have surgery  stroke  an unusual or allergic reaction to bevacizumab, hamster proteins, mouse proteins, other medicines, foods, dyes, or preservatives  pregnant or trying to get pregnant  breast-feeding How should I use this medicine? This medicine is for infusion into a vein. It is given by a health care professional in a hospital or clinic setting. Talk to your pediatrician regarding the use of this medicine in children. Special care may be needed. Overdosage: If you think you have taken too much of this medicine contact a poison control center or emergency room at once. NOTE: This medicine is only for you. Do not share this medicine with others. What if I miss a dose? It is important not to miss your dose. Call your doctor or health care professional if you are unable to keep an appointment. What may interact with this medicine? Interactions are not expected. This list may not describe all possible interactions. Give your health care provider a list of all the medicines, herbs, non-prescription drugs, or dietary supplements you use. Also tell them if you smoke, drink alcohol, or use illegal drugs. Some items may interact with your medicine. What should I watch for while using this medicine? Your condition will be monitored carefully while  you are receiving this medicine. You will need important blood work and urine testing done while you are taking this medicine. This medicine may increase your risk to bruise or bleed. Call your doctor or health care professional if you notice any unusual bleeding. Before having surgery, talk to your health care provider to make sure it is ok. This drug can increase the risk of poor healing of your surgical site or wound. You will need to stop this drug for 28 days before surgery. After surgery, wait at least 28 days before restarting this drug. Make sure the surgical site or wound is healed enough before restarting this drug. Talk to your health care provider if questions. Do not become pregnant while taking this medicine or for 6 months after stopping it. Women should inform their doctor if they wish to become pregnant or think they might be pregnant. There is a potential for serious side effects to an unborn child. Talk to your health care professional or pharmacist for more information. Do not breast-feed an infant while taking this medicine and for 6 months after the last dose. This medicine has caused ovarian failure in some women. This medicine may interfere with the ability to have a child. You should talk to your doctor or health care professional if you are concerned about your fertility. What side effects may I notice from receiving this medicine? Side effects that you should report to your doctor or health care professional as soon as possible:  allergic reactions like skin rash, itching or hives, swelling of the face, lips, or tongue  chest pain or chest tightness  chills  coughing up blood  high fever  seizures  severe constipation  signs and symptoms of bleeding such as bloody or black, tarry stools; red or dark-brown urine; spitting up blood or brown material that looks like coffee grounds; red spots on the skin; unusual bruising or bleeding from the eye, gums, or nose  signs  and symptoms of a blood clot such as breathing problems; chest pain; severe, sudden headache; pain, swelling, warmth in the leg  signs and symptoms of a stroke like changes in vision; confusion; trouble speaking or understanding; severe headaches; sudden numbness or weakness of the face, arm or leg; trouble walking; dizziness; loss of balance or coordination  stomach pain  sweating  swelling of legs or ankles  vomiting  weight gain Side effects that usually do not require medical attention (report to your doctor or health care professional if they continue or are bothersome):  back pain  changes in taste  decreased appetite  dry skin  nausea  tiredness This list may not describe all possible side effects. Call your doctor for medical advice about side effects. You may report side effects to FDA at 1-800-FDA-1088. Where should I keep my medicine? This drug is given in a hospital or clinic and will not be stored at home. NOTE: This sheet is a summary. It may not cover all possible information. If you have questions about this medicine, talk to your doctor, pharmacist, or health care provider.  2020 Elsevier/Gold Standard (2019-09-30 10:50:46) Pemetrexed injection What is this medicine? PEMETREXED (PEM e TREX ed) is a chemotherapy drug used to treat lung cancers like non-small cell lung cancer and mesothelioma. It may also be used to treat other cancers. This medicine may be used for other purposes; ask your health care provider or pharmacist if you have questions. COMMON BRAND NAME(S): Alimta What should I tell my health care provider before I take this medicine? They need to know if you have any of these conditions:  infection (especially a virus infection such as chickenpox, cold sores, or herpes)  kidney disease  low blood counts, like low white cell, platelet, or red cell counts  lung or breathing disease, like asthma  radiation therapy  an unusual or allergic  reaction to pemetrexed, other medicines, foods, dyes, or preservative  pregnant or trying to get pregnant  breast-feeding How should I use this medicine? This drug is given as an infusion into a vein. It is administered in a hospital or clinic by a specially trained health care professional. Talk to your pediatrician regarding the use of this medicine in children. Special care may be needed. Overdosage: If you think you have taken too much of this medicine contact a poison control center or emergency room at once. NOTE: This medicine is only for you. Do not share this medicine with others. What if I miss a dose? It is important not to miss your dose. Call your doctor or health care professional if you are unable to keep an appointment. What may interact with this medicine? This medicine may interact with the following medications:  Ibuprofen This list may not describe all possible interactions. Give your health care provider a list of all the medicines, herbs, non-prescription drugs, or dietary supplements you use. Also tell them if you smoke, drink alcohol, or use illegal drugs. Some items may interact with your medicine. What should I watch for while using this medicine? Visit your doctor for checks on your progress. This drug may make you feel  generally unwell. This is not uncommon, as chemotherapy can affect healthy cells as well as cancer cells. Report any side effects. Continue your course of treatment even though you feel ill unless your doctor tells you to stop. In some cases, you may be given additional medicines to help with side effects. Follow all directions for their use. Call your doctor or health care professional for advice if you get a fever, chills or sore throat, or other symptoms of a cold or flu. Do not treat yourself. This drug decreases your body's ability to fight infections. Try to avoid being around people who are sick. This medicine may increase your risk to bruise or  bleed. Call your doctor or health care professional if you notice any unusual bleeding. Be careful brushing and flossing your teeth or using a toothpick because you may get an infection or bleed more easily. If you have any dental work done, tell your dentist you are receiving this medicine. Avoid taking products that contain aspirin, acetaminophen, ibuprofen, naproxen, or ketoprofen unless instructed by your doctor. These medicines may hide a fever. Call your doctor or health care professional if you get diarrhea or mouth sores. Do not treat yourself. To protect your kidneys, drink water or other fluids as directed while you are taking this medicine. Do not become pregnant while taking this medicine or for 6 months after stopping it. Women should inform their doctor if they wish to become pregnant or think they might be pregnant. Men should not father a child while taking this medicine and for 3 months after stopping it. This may interfere with the ability to father a child. You should talk to your doctor or health care professional if you are concerned about your fertility. There is a potential for serious side effects to an unborn child. Talk to your health care professional or pharmacist for more information. Do not breast-feed an infant while taking this medicine or for 1 week after stopping it. What side effects may I notice from receiving this medicine? Side effects that you should report to your doctor or health care professional as soon as possible:  allergic reactions like skin rash, itching or hives, swelling of the face, lips, or tongue  breathing problems  redness, blistering, peeling or loosening of the skin, including inside the mouth  signs and symptoms of bleeding such as bloody or black, tarry stools; red or dark-brown urine; spitting up blood or brown material that looks like coffee grounds; red spots on the skin; unusual bruising or bleeding from the eye, gums, or nose  signs and  symptoms of infection like fever or chills; cough; sore throat; pain or trouble passing urine  signs and symptoms of kidney injury like trouble passing urine or change in the amount of urine  signs and symptoms of liver injury like dark yellow or brown urine; general ill feeling or flu-like symptoms; light-colored stools; loss of appetite; nausea; right upper belly pain; unusually weak or tired; yellowing of the eyes or skin Side effects that usually do not require medical attention (report to your doctor or health care professional if they continue or are bothersome):  constipation  mouth sores  nausea, vomiting  unusually weak or tired This list may not describe all possible side effects. Call your doctor for medical advice about side effects. You may report side effects to FDA at 1-800-FDA-1088. Where should I keep my medicine? This drug is given in a hospital or clinic and will not be stored at  home. NOTE: This sheet is a summary. It may not cover all possible information. If you have questions about this medicine, talk to your doctor, pharmacist, or health care provider.  2020 Elsevier/Gold Standard (2018-01-22 16:11:33) Carboplatin injection What is this medicine? CARBOPLATIN (KAR boe pla tin) is a chemotherapy drug. It targets fast dividing cells, like cancer cells, and causes these cells to die. This medicine is used to treat ovarian cancer and many other cancers. This medicine may be used for other purposes; ask your health care provider or pharmacist if you have questions. COMMON BRAND NAME(S): Paraplatin What should I tell my health care provider before I take this medicine? They need to know if you have any of these conditions:  blood disorders  hearing problems  kidney disease  recent or ongoing radiation therapy  an unusual or allergic reaction to carboplatin, cisplatin, other chemotherapy, other medicines, foods, dyes, or preservatives  pregnant or trying to get  pregnant  breast-feeding How should I use this medicine? This drug is usually given as an infusion into a vein. It is administered in a hospital or clinic by a specially trained health care professional. Talk to your pediatrician regarding the use of this medicine in children. Special care may be needed. Overdosage: If you think you have taken too much of this medicine contact a poison control center or emergency room at once. NOTE: This medicine is only for you. Do not share this medicine with others. What if I miss a dose? It is important not to miss a dose. Call your doctor or health care professional if you are unable to keep an appointment. What may interact with this medicine?  medicines for seizures  medicines to increase blood counts like filgrastim, pegfilgrastim, sargramostim  some antibiotics like amikacin, gentamicin, neomycin, streptomycin, tobramycin  vaccines Talk to your doctor or health care professional before taking any of these medicines:  acetaminophen  aspirin  ibuprofen  ketoprofen  naproxen This list may not describe all possible interactions. Give your health care provider a list of all the medicines, herbs, non-prescription drugs, or dietary supplements you use. Also tell them if you smoke, drink alcohol, or use illegal drugs. Some items may interact with your medicine. What should I watch for while using this medicine? Your condition will be monitored carefully while you are receiving this medicine. You will need important blood work done while you are taking this medicine. This drug may make you feel generally unwell. This is not uncommon, as chemotherapy can affect healthy cells as well as cancer cells. Report any side effects. Continue your course of treatment even though you feel ill unless your doctor tells you to stop. In some cases, you may be given additional medicines to help with side effects. Follow all directions for their use. Call your  doctor or health care professional for advice if you get a fever, chills or sore throat, or other symptoms of a cold or flu. Do not treat yourself. This drug decreases your body's ability to fight infections. Try to avoid being around people who are sick. This medicine may increase your risk to bruise or bleed. Call your doctor or health care professional if you notice any unusual bleeding. Be careful brushing and flossing your teeth or using a toothpick because you may get an infection or bleed more easily. If you have any dental work done, tell your dentist you are receiving this medicine. Avoid taking products that contain aspirin, acetaminophen, ibuprofen, naproxen, or ketoprofen unless instructed  by your doctor. These medicines may hide a fever. Do not become pregnant while taking this medicine. Women should inform their doctor if they wish to become pregnant or think they might be pregnant. There is a potential for serious side effects to an unborn child. Talk to your health care professional or pharmacist for more information. Do not breast-feed an infant while taking this medicine. What side effects may I notice from receiving this medicine? Side effects that you should report to your doctor or health care professional as soon as possible:  allergic reactions like skin rash, itching or hives, swelling of the face, lips, or tongue  signs of infection - fever or chills, cough, sore throat, pain or difficulty passing urine  signs of decreased platelets or bleeding - bruising, pinpoint red spots on the skin, black, tarry stools, nosebleeds  signs of decreased red blood cells - unusually weak or tired, fainting spells, lightheadedness  breathing problems  changes in hearing  changes in vision  chest pain  high blood pressure  low blood counts - This drug may decrease the number of white blood cells, red blood cells and platelets. You may be at increased risk for infections and  bleeding.  nausea and vomiting  pain, swelling, redness or irritation at the injection site  pain, tingling, numbness in the hands or feet  problems with balance, talking, walking  trouble passing urine or change in the amount of urine Side effects that usually do not require medical attention (report to your doctor or health care professional if they continue or are bothersome):  hair loss  loss of appetite  metallic taste in the mouth or changes in taste This list may not describe all possible side effects. Call your doctor for medical advice about side effects. You may report side effects to FDA at 1-800-FDA-1088. Where should I keep my medicine? This drug is given in a hospital or clinic and will not be stored at home. NOTE: This sheet is a summary. It may not cover all possible information. If you have questions about this medicine, talk to your doctor, pharmacist, or health care provider.  2020 Elsevier/Gold Standard (2008-03-09 14:38:05)

## 2020-11-04 NOTE — Progress Notes (Signed)
Hematology/Oncology Follow Up Note [facility]  Telephone:([phone] [phone]  Patient Care Team: Ranae Plumber, [place] as PCP - General (Family Medicine) Telford Nab, RN as Oncology Nurse Navigator   Name of the patient: Brittney Fisher  [id number]  Jan 31, 1961   REASON FOR VISIT  follow-up for lung mass pleural effusion  PERTINENT ONCOLOGY HISTORY 59 y.o. female with past medical history including GERD, COPD, current everyday smoker presents for follow-up of lung mass and pleural effusion. 10/20/2020, patient was brought to ED via EMS due to generalized weakness, dizziness, chest pain shortness of breath.  She was recently diagnosed with COPD approximately 1 month ago by primary care provider.  Also unintentional weight loss during the last few months. Image work-up showed right-sided pleural effusion.  She underwent right thoracentesis.  With removal of 2.4 L of fluid. 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  Metastatic disease to low cervical/thoracic nodes, lung, right pleural space and bones.  Moderate to large right pleural effusion.  SVC narrowing. 10/21/2020 MRI brain is negative for metastasis.  Mild chronic microvascular ischemic changes in the white matter.  Negative for acute infarct Regarding to the SVC narrowing, patient was seen by vascular surgeon and was recommended no intervention inpatient.  Patient to follow-up outpatient with vascular surgeon for evaluation. Patient also was treated for COPD exacerbation with hypoxia on exertion. Qualifies for home oxygen and hospitalist arrange home health and a nebulizer.  Patient was given a course of prednisone taper and empiric Levaquin. Patient was discharged and present today to follow-up with cytology results and further management plan  10/27/2020 PET scan showed right upper lobe primary bronchogenic  mass with direct invasion into mediastinum. Metastatic disease to the right pleural space, lung, bone, nodes of the chest and less so lower leg/upper abdomen. Hypermetabolic him along the course of the right axillary vein with concurrent subtle hyper attenuation within the axillary vein and SVC.  This continues to the level of SVC narrowing.  Suspicious for SVC occlusion and developing thrombus. Moderate right pleural effusion and small pericardial effusion.   INTERVAL HISTORY Brittney Fisher is a 59 y.o. female who has above history reviewed by me today presents for follow up visit for management of metastatic lung cancer.  Problems and complaints are listed below: # left supraclavicular mass biopsy- pathology is positive for adenocarcinoma. positive for CK7, with patchy, weak CK20. They are  negative for TTF1, NapsinA, GATA3, CDX2, and Pax8. The findings are  nonspecific; but may be compatible with a poorly differentiated  adenocarcinoma of lung origin, especially given the imaging findings  She also had another repeat thoracentesis and fluid cytology showed malignancy.  She has tried tramadol which did not relieve her pain. Switched to oxycodone 5mg  which helped her pain.   Review of Systems  Constitutional: Positive for fatigue. Negative for appetite change, chills, fever and unexpected weight change.  HENT:   Negative for hearing loss and voice change.   Eyes: Negative for eye problems.  Respiratory: Positive for shortness of breath. Negative for chest tightness and cough.   Cardiovascular: Negative for chest pain.  Gastrointestinal: Negative for abdominal distention, abdominal pain and blood in stool.  Endocrine: Negative for hot flashes.  Genitourinary: Negative for difficulty urinating and frequency.   Musculoskeletal: Negative for arthralgias.       Right side chest wall pain has improved.   Skin: Negative for itching and rash.  Neurological: Negative for extremity weakness.   Hematological: Negative for adenopathy.  Psychiatric/Behavioral: Negative for confusion.      No Known Allergies   Past Medical History:  Diagnosis Date  . COPD (chronic obstructive pulmonary disease) ([place])   . GERD (gastroesophageal reflux disease)   . Metastatic lung cancer (metastasis from lung to other site) [facility]) 11/02/2020     Past Surgical History:  Procedure Laterality Date  . COLONOSCOPY WITH ESOPHAGOGASTRODUODENOSCOPY (EGD)    . COLONOSCOPY WITH PROPOFOL N/A 11/14/2015   Procedure: COLONOSCOPY WITH PROPOFOL;  Surgeon: Hulen Luster, MD;  Location: [facility] ENDOSCOPY;  Service: Gastroenterology;  Laterality: N/A;  . ESOPHAGOGASTRODUODENOSCOPY (EGD) WITH PROPOFOL N/A 11/14/2015   Procedure: ESOPHAGOGASTRODUODENOSCOPY (EGD) WITH PROPOFOL;  Surgeon: Hulen Luster, MD;  Location: [facility] ENDOSCOPY;  Service: Gastroenterology;  Laterality: N/A;    Social History   Socioeconomic History  . Marital status: Married    Spouse name: Not on file  . Number of children: Not on file  . Years of education: Not on file  . Highest education level: Not on file  Occupational History  . Not on file  Tobacco Use  . Smoking status: Current Every Day Smoker    Packs/day: 0.20    Types: Cigarettes  . Smokeless tobacco: Never Used  Substance and Sexual Activity  . Alcohol use: Never  . Drug use: Never  . Sexual activity: Not Currently  Other Topics Concern  . Not on file  Social History Narrative  . Not on file   Social Determinants of Health   Financial Resource Strain:   . Difficulty of Paying Living Expenses: Not on file  Food Insecurity:   . Worried About Charity fundraiser in the Last Year: Not on file  . Ran Out of Food in the Last Year: Not on file  Transportation Needs:   . Lack of Transportation (Medical): Not on file  . Lack of Transportation (Non-Medical): Not on file  Physical Activity:   . Days of Exercise per Week: Not on file  . Minutes of Exercise per Session: Not on  file  Stress:   . Feeling of Stress : Not on file  Social Connections:   . Frequency of Communication with Friends and Family: Not on file  . Frequency of Social Gatherings with Friends and Family: Not on file  . Attends Religious Services: Not on file  . Active Member of Clubs or Organizations: Not on file  . Attends Archivist Meetings: Not on file  . Marital Status: Not on file  Intimate Partner Violence:   . Fear of Current or Ex-Partner: Not on file  . Emotionally Abused: Not on file  . Physically Abused: Not on file  . Sexually Abused: Not on file    Family History  Problem Relation Age of Onset  . Breast cancer Cousin 2  . Diabetes type II Mother   . Hypertension Mother   . Colon cancer Father   . Hypertension Father      Current Outpatient Medications:  .  benzonatate (TESSALON) 100 MG capsule, Take 1 capsule (100 mg total) by mouth 3 (three) times daily as needed for cough., Disp: 60 capsule, Rfl: 1 .  feeding supplement (ENSURE ENLIVE / ENSURE PLUS) LIQD, Take 237 mLs by mouth 2 (two) times daily between meals., Disp: 237 mL, Rfl: 12 .  folic acid (FOLVITE) 1 MG tablet, Take 1 tablet (1 mg total) by mouth daily. Start 5-7 days before Alimta chemotherapy. Continue  until 21 days after Alimta completed., Disp: 100 tablet, Rfl: 3 .  guaiFENesin (MUCINEX) 600 MG 12 hr tablet, Take 1 tablet (600 mg total) by mouth 2 (two) times daily., Disp: 14 tablet, Rfl: 0 .  ipratropium-albuterol (DUONEB) 0.5-2.5 (3) MG/3ML SOLN, Take 3 mLs by nebulization 3 (three) times daily., Disp: 360 mL, Rfl: 1 .  omeprazole (PRILOSEC) 20 MG capsule, Take 20 mg by mouth daily., Disp: , Rfl:  .  oxyCODONE (OXY IR/ROXICODONE) 5 MG immediate release tablet, Take 1 tablet (5 mg total) by mouth every 6 (six) hours as needed for severe pain., Disp: 32 tablet, Rfl: 0 .  tiotropium (SPIRIVA HANDIHALER) 18 MCG inhalation capsule, Place 18 mcg into inhaler and inhale daily., Disp: , Rfl:  .   acetaminophen (TYLENOL) 500 MG tablet, Take 1,000 mg by mouth every 8 (eight) hours as needed for moderate pain. (Patient not taking: Reported on 11/04/2020), Disp: , Rfl:  .  dexamethasone (DECADRON) 4 MG tablet, Take 1 tab (4 mg) twice a day the day before Alimta chemo. Take 2 tablets (8 mg) po daily x 3 days starting the day after chemo. (Patient not taking: Reported on 11/04/2020), Disp: 30 tablet, Rfl: 1 .  levofloxacin (LEVAQUIN) 750 MG tablet, Take 1 tablet (750 mg total) by mouth daily. (Patient not taking: Reported on 11/04/2020), Disp: 5 tablet, Rfl: 0 .  ondansetron (ZOFRAN) 8 MG tablet, Take 1 tablet (8 mg total) by mouth 2 (two) times daily as needed for refractory nausea / vomiting. Start on day 3 after chemo. (Patient not taking: Reported on 11/04/2020), Disp: 30 tablet, Rfl: 1 .  predniSONE (DELTASONE) 10 MG tablet, Take 4 tablets (40 mg total) by mouth daily with breakfast. Take 40 mg daily--taper by 10 mg daily then stop (Patient not taking: Reported on 11/04/2020), Disp: 10 tablet, Rfl: 0 .  prochlorperazine (COMPAZINE) 10 MG tablet, Take 1 tablet (10 mg total) by mouth every 6 (six) hours as needed (Nausea or vomiting). (Patient not taking: Reported on 11/04/2020), Disp: 30 tablet, Rfl: 1  Physical exam:  Vitals:   11/04/20 1448  BP: 103/61  Pulse: 100  Temp: 100 F (37.8 C)  Weight: 147 lb 14.4 oz (67.1 kg)   Physical Exam Constitutional:      General: She is not in acute distress.    Comments: Thin built female walks independently  HENT:     Head: Normocephalic and atraumatic.  Eyes:     General: No scleral icterus. Cardiovascular:     Rate and Rhythm: Normal rate and regular rhythm.     Heart sounds: Normal heart sounds.  Pulmonary:     Effort: Pulmonary effort is normal. No respiratory distress.     Breath sounds: No wheezing.     Comments: Decreased breath sounds in right lower lung Abdominal:     General: Bowel sounds are normal. There is no distension.      Palpations: Abdomen is soft.  Musculoskeletal:        General: No deformity. Normal range of motion.     Cervical back: Normal range of motion and neck supple.  Skin:    General: Skin is warm and dry.     Findings: No erythema or rash.  Neurological:     Mental Status: She is alert and oriented to person, place, and time. Mental status is at baseline.     Cranial Nerves: No cranial nerve deficit.     Coordination: Coordination normal.  Psychiatric:  Mood and Affect: Mood normal.     CMP Latest Ref Rng & Units 10/21/2020  Glucose 70 - 99 mg/dL 101(H)  BUN 8 - 23 mg/dL 13  Creatinine 0.44 - 1.00 mg/dL 0.76  Sodium 135 - 145 mmol/L 134(L)  Potassium 3.5 - 5.1 mmol/L 3.9  Chloride 98 - 111 mmol/L 102  CO2 22 - 32 mmol/L 23  Calcium 8.9 - 10.3 mg/dL 8.5(L)  Total Protein 6.5 - 8.1 g/dL 6.4(L)  Total Bilirubin 0.3 - 1.2 mg/dL 0.6  Alkaline Phos 38 - 126 U/L 62  AST 15 - 41 U/L 16  ALT 0 - 44 U/L 17   CBC Latest Ref Rng & Units 10/21/2020  WBC 4.0 - 10.5 K/uL 14.0(H)  Hemoglobin 12.0 - 15.0 g/dL 11.9(L)  Hematocrit 36 - 46 % 35.3(L)  Platelets 150 - 400 K/uL 242    RADIOGRAPHIC STUDIES: I have personally reviewed the radiological images as listed and agreed with the findings in the report. CT CHEST W CONTRAST  Result Date: 10/18/2020 CLINICAL DATA:  Malignant pleural effusion. Recently diagnosed with COPD. Abnormal chest radiograph. EXAM: CT CHEST WITH CONTRAST TECHNIQUE: Multidetector CT imaging of the chest was performed during intravenous contrast administration. CONTRAST:  45mL ISOVUE-300 IOPAMIDOL (ISOVUE-300) INJECTION 61% COMPARISON:  10/05/2020 chest radiograph. FINDINGS: Cardiovascular: Advanced aortic and branch vessel atherosclerosis. Tortuous thoracic aorta. Normal heart size with minimal pericardial fluid. Left upper lobe pulmonary vein is surrounded and narrowed by adenopathy on 70/2. Pulmonary artery branches are also narrowed by adenopathy and the central right  upper lobe lung mass. No pulmonary artery filling defect. SVC narrowing including on 60/2 and coronal image 62. Mediastinum/Nodes: Right supraclavicular/low jugular 1.5 cm node on 09/02. Massive thoracic adenopathy. Pretracheal mass/adenopathy measures 3.7 cm on 55/2. Subcarinal adenopathy measures 2.1 cm on 75/2. Extensive left hilar and infrahilar adenopathy, including a nodal mass of 1.6 x 3.7 cm on 70/2. Prevascular adenopathy at 1.7 cm on 64/2. Juxta diaphragmatic left-sided nodes or nodules including at 9 mm on 147/2. Lungs/Pleura: Moderate to large right pleural effusion. Extensive irregular soft tissue thickening within the right pleural space, including on 83/2, consistent with pleural metastasis. Anterior right pleural implant measures 5.9 cm maximally on 122/2. Mild tracheal deviation to the right. Right upper lobe bronchial obstruction on 62/3. Right middle lobe endobronchial obstruction 80/3. Significant mass-effect upon right lower lobe bronchi with near complete obstruction. A central right upper lobe lung mass with direct mediastinal invasion is most apparent on soft tissue windows, including at 7.1 x 6.4 cm on 57/2. A combination of postobstructive and compressive atelectasis involves the majority of the remainder of the right lung. Extensive bilateral pulmonary nodules/metastasis. Index left upper lobe 1.0 cm nodule on 103/3. Index left lower lobe pulmonary nodule measures 1.0 cm on 111/3. Right lower lobe lung nodule of 11 mm on 110/3. Upper Abdomen: Motion degradation in the upper abdomen. Normal imaged portions of the liver, spleen, pancreas, gallbladder. Left greater than right, relatively mild adrenal thickening and nodularity. Proximal gastric underdistention. Upper pole 1.3 cm left renal cyst. Interpolar right renal too small to characterize lesion. Abdominal aortic atherosclerosis. Musculoskeletal: Multiple sclerotic foci, including within the sternal manubrium on sagittal image 97. Lytic  lesion within the T2 vertebral body measures 11 mm on sagittal image 99. IMPRESSION: 1. 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 2. Metastatic disease to low cervical/thoracic nodes, lungs, right pleural space, and bones. 3. Moderate to large right pleural effusion. 4. SVC narrowing, suggesting impending  SVC syndrome. Consider relatively urgent multidisciplinary thoracic oncology consultation. These results will be called to the ordering clinician or representative by the Radiologist Assistant, and communication documented in the PACS or [employer]. Electronically Signed   By: Abigail Miyamoto M.D.   On: 10/18/2020 16:42   MR BRAIN W WO CONTRAST  Result Date: 10/21/2020 CLINICAL DATA:  New diagnosis lung cancer. Rule out metastatic disease. Mental status change. EXAM: MRI HEAD WITHOUT AND WITH CONTRAST TECHNIQUE: Multiplanar, multiecho pulse sequences of the brain and surrounding structures were obtained without and with intravenous contrast. CONTRAST:  77mL GADAVIST GADOBUTROL 1 MMOL/ML IV SOLN COMPARISON:  None. FINDINGS: Brain: Negative for metastatic disease. No enhancing mass lesion in the brain. No cerebral edema identified. Negative for acute infarct. Scattered small white matter hyperintensities consistent with chronic microvascular ischemia. Negative for hemorrhage or mass lesion. Vascular: Normal arterial flow voids Skull and upper cervical spine: No focal skeletal lesion. Sinuses/Orbits: Paranasal sinuses clear.  Negative orbit Other: None IMPRESSION: Negative for metastatic disease Mild chronic microvascular ischemic change in the white matter. Negative for acute infarct. Electronically Signed   By: Franchot Gallo M.D.   On: 10/21/2020 18:56   NM PET Image Initial (PI) Skull Base To Thigh  Result Date: 10/28/2020 CLINICAL DATA:  Initial treatment strategy for right upper lobe primary bronchogenic carcinoma. Malignant pleural effusion. EXAM:  NUCLEAR MEDICINE PET SKULL BASE TO THIGH TECHNIQUE: 8.5 mCi F-18 FDG was injected intravenously. Full-ring PET imaging was performed from the skull base to thigh after the radiotracer. CT data was obtained and used for attenuation correction and anatomic localization. Fasting blood glucose: 91 mg/dl COMPARISON:  Chest CT 10/18/2020 FINDINGS: Mediastinal blood pool activity: SUV max 1.9 Liver activity: SUV max Not applicable. NECK: Hypermetabolic low right jugular/supraclavicular node measures 1.5 cm and a S.U.V. max of 7.0 on 59/3. CT findings: none CHEST: Central right upper lobe lung mass with direct mediastinal invasion. This measured 7.1 x 6.4 cm on prior diagnostic CT and a S.U.V. max of 9.6 on 86/3 today. Extensive right-sided pleural metastasis. The index implant on the prior CT corresponds to hypermetabolism, including at a S.U.V. max of 7.4 on 130/3 today. Left suprahilar node measures 1.9 cm and a S.U.V. max of 8.6 on 87/3. Left infrahilar adenopathy at 1.6 cm and a S.U.V. max of 8.1 on 107/3. Extensive hypermetabolic pulmonary metastasis. Example in the left lower lobe at 1.7 cm and a S.U.V. max of 4.4 on 117/3. Hypermetabolism along the course of the right axillary vein, including at a S.U.V. max of 5.5 on 65/3. The vein is subtly hyperattenuating, and followed centrally to the level of SVC narrowing, including on 73/3. Incidental CT findings: Deferred to recent diagnostic CT. Moderate right pleural effusion is not significantly changed. Similar small pericardial effusion. ABDOMEN/PELVIS: A node adjacent to the right adrenal measures 5 mm and a S.U.V. max of 4.8 on 151/3. No abdominopelvic parenchymal hypermetabolism identified. Incidental CT findings: Abdominal aortic atherosclerosis. Bilateral adrenal thickening suggesting hyperplasia. Large colonic stool burden. SKELETON: Multifocal osseous metastasis. Index left posterior iliac lesion measures 8 mm and a S.U.V. max of 8.8 on 203/3. T2 lytic lesion is  eccentric left at 11 mm and a S.U.V. max of 10.1 on 60/3. Incidental CT findings: none IMPRESSION: 1. Right upper lobe primary bronchogenic carcinoma with direct mediastinal invasion. 2. Metastatic disease to right pleural space, lungs, bones, nodes of the chest and less so lower neck/upper abdomen. 3. Hypermetabolism along the course of the right axillary vein with  concurrent subtle hyperattenuation within the axillary vein and SVC. This continues to the level of SVC narrowing, suspicious for SVC occlusion and developing thrombus. Recommend clinical correlation for SVC syndrome. 4. Moderate right pleural effusion and small pericardial effusion, similar. Electronically Signed   By: Abigail Miyamoto M.D.   On: 10/28/2020 09:55   DG Chest Port 1 View  Result Date: 10/31/2020 CLINICAL DATA:  Right thoracentesis EXAM: PORTABLE CHEST 1 VIEW COMPARISON:  10/20/2020 FINDINGS: Right hilar mass with upper lobe collapse/replacement. Multiple pulmonary nodules. Small right pleural effusion in the frontal projection. No evidence of pneumothorax. Stable heart size. IMPRESSION: Stable from 10/20/2020.  No complicating features of thoracentesis. Electronically Signed   By: Monte Fantasia M.D.   On: 10/31/2020 09:14   DG Chest Port 1 View  Result Date: 10/20/2020 CLINICAL DATA:  No known primary, now with concern for malignant right-sided pleural effusion post thoracentesis. EXAM: PORTABLE CHEST 1 VIEW COMPARISON:  Chest radiograph-earlier same day; chest CT-10/18/2020 FINDINGS: Interval reduction in persistent trace right-sided effusion post thoracentesis. No pneumothorax. Improved aeration of the right mid and lower lung with persistent right basilar consolidative opacities, likely atelectasis. Redemonstrated extensive bilateral nodular opacities with obscuration of the right heart border secondary to persistent atelectasis/collapse of the right upper lobe. No left-sided pleural effusion or pneumothorax. No evidence of  edema. No acute osseous abnormalities. IMPRESSION: 1. Interval reduction in persistent trace right-sided effusion post thoracentesis. No pneumothorax. 2. Improved aeration of the right mid and lower lung with persistent right basilar opacities, likely atelectasis. 3. Similar findings of right upper lobe collapse and extensive metastatic disease involving the lungs bilaterally as better demonstrated recent chest CT. Electronically Signed   By: Sandi Mariscal M.D.   On: 10/20/2020 12:17   DG Chest Port 1 View  Result Date: 10/20/2020 CLINICAL DATA:  Shortness of breath EXAM: PORTABLE CHEST 1 VIEW COMPARISON:  10/04/2020.  Chest CT from 2 days ago FINDINGS: Large right pleural effusion obscuring most of the right lung. Reticulonodular opacity on the left due to multiple pulmonary nodules. Normal heart size. Thoracic adenopathy by CT. IMPRESSION: Extensive intrathoracic malignancy by recent chest CT. Very large right pleural effusion. Electronically Signed   By: Monte Fantasia M.D.   On: 10/20/2020 04:41   [place] CORE BIOPSY (LYMPH NODES)  Result Date: 10/31/2020 INDICATION: Lung carcinoma.  Hypermetabolic right supraclavicular adenopathy. EXAM: ULTRASOUND GUIDED CORE BIOPSY OF RIGHT SUPRACLAVICULAR ADENOPATHY MEDICATIONS: No antibiotics indicated ANESTHESIA/SEDATION: Lidocaine 1% subcutaneous PROCEDURE: The procedure, risks, benefits, and alternatives were explained to the patient. Questions regarding the procedure were encouraged and answered. The patient understands and consents to the procedure. Survey ultrasound of the right supraclavicular region was performed. The adenopathy was localized and an appropriate skin entry site was determined and marked. Thrombosis of the right IJ vein incidentally noted. The operative field was prepped with chlorhexidine in a sterile fashion, and a sterile drape was applied covering the operative field. A sterile gown and sterile gloves were used for the procedure. Local  anesthesia was provided with 1% Lidocaine. Under real-time ultrasound guidance, multiple 18-gauge core biopsy samples were obtained, submitted in saline on Telfa to surgical pathology. The guide needle was removed. Postprocedure scans show no hemorrhage or other apparent complication. The patient tolerated the procedure well. COMPLICATIONS: None immediate. FINDINGS: Right supraclavicular adenopathy was localized. Representative core biopsy samples obtained as above. Thrombosis of the right IJ vein incidentally noted. IMPRESSION: 1. Technically successful ultrasound-guided core biopsy, right supraclavicular adenopathy. 2. Right IJ venous thrombosis incidentally  noted. Electronically Signed   By: Lucrezia Europe M.D.   On: 10/31/2020 11:01   US THORACENTESIS ASP PLEURAL SPACE W/IMG GUIDE  Result Date: 10/31/2020 INDICATION: Right lung carcinoma. Pleural metastases and recurrent symptomatic effusion EXAM: ULTRASOUND GUIDED RIGHT THORACENTESIS MEDICATIONS: Lidocaine 1% subcutaneous COMPLICATIONS: None immediate. Postprocedure chest radiograph shows no pneumothorax. PROCEDURE: An ultrasound guided thoracentesis was thoroughly discussed with the patient and questions answered. The benefits, risks, alternatives and complications were also discussed. The patient understands and wishes to proceed with the procedure. Written consent was obtained. Ultrasound was performed to localize and mark an adequate pocket of fluid in the right chest. The area was then prepped and draped in the normal sterile fashion. 1% Lidocaine was used for local anesthesia. Under ultrasound guidance a 6 Fr Safe-T-Centesis catheter was introduced. Thoracentesis was performed. The catheter was removed and a dressing applied. FINDINGS: A total of approximately 2.3 L of red tinged fluid was removed. Samples were sent to the laboratory as requested by the clinical team. IMPRESSION: Successful ultrasound guided right thoracentesis yielding 2.3 L of pleural  fluid. Electronically Signed   By: Lucrezia Europe M.D.   On: 10/31/2020 11:02   US THORACENTESIS ASP PLEURAL SPACE W/IMG GUIDE  Result Date: 10/20/2020 INDICATION: No known primary, now with concern for malignant right-sided pleural effusion. Please perform ultrasound-guided thoracentesis for diagnostic and therapeutic purposes. EXAM: US THORACENTESIS ASP PLEURAL SPACE W/IMG GUIDE COMPARISON:  Chest CT-10/18/2020 MEDICATIONS: None. COMPLICATIONS: None immediate. TECHNIQUE: Informed written consent was obtained from the patient after a discussion of the risks, benefits and alternatives to treatment. A timeout was performed prior to the initiation of the procedure. Initial ultrasound scanning demonstrates a large complex right-sided pleural effusion with large amount floating echogenic debris within the pleural fluid but without definitive loculations. The lower chest was prepped and draped in the usual sterile fashion. 1% lidocaine was used for local anesthesia. An ultrasound image was saved for documentation purposes. An 8 Fr Safe-T-Centesis catheter was introduced. The thoracentesis was performed. The catheter was removed and a dressing was applied. The patient tolerated the procedure well without immediate post procedural complication. The patient was escorted to have an upright chest radiograph. FINDINGS: A total of approximately 2.4 liters of serous fluid was removed. Requested samples were sent to the laboratory. IMPRESSION: Successful ultrasound-guided right sided thoracentesis yielding 2.4 liters of pleural fluid. Electronically Signed   By: Sandi Mariscal M.D.   On: 10/20/2020 12:15     Assessment and plan  1. Primary malignant neoplasm of lung metastatic to other site, unspecified laterality ([place])   2. Pleural effusion   3. Weight loss   4. Goals of care, counseling/discussion   5. 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  cT4 N3 M1 Pathology was reviewed and discussed with patient.  Bronch obstruction and SVC occlusion- refer to Radonc likely need palliative radiation.  She will need cytotoxic chemotherapy for quicker response.  The diagnosis and care plan were discussed with patient in detail.  NCCN guidelines were reviewed and shared with patient.   The goal of treatment which is to palliate disease, disease related symptoms, improve quality of life and hopefully prolong life was highlighted in our discussion.  Chemotherapy education was provided.  We had discussed the composition of chemotherapy regimen, length of chemo cycle, duration of treatment and the time to assess response to treatment.    I explained to the patient the risks and benefits of chemotherapy Carboplatin Alimta bevacizumab including all but not limited to hair loss, mouth sore, nausea, vomiting, diarrhea, low blood counts,high blood pressure, stroke, cardiovascular disease,  bleeding,perforation neuropathy and risk of life threatening infection and even death, secondary malignancy etc.  .Patient voices understanding and willing to proceed chemotherapy. - Vitamin B12 IM x 1 - start folic acid  # Send NGS. Plan to add immunotherapy after she finishes RT.   # Chemotherapy education;She will see Dr.Dew in December for evaluation of feasibility of Medi- port placement. Antiemetics-Zofran and Compazine; EMLA cream sent to pharmacy  Supportive care measures are necessary for patient well-being and will be provided as necessary. We spent sufficient time to discuss many aspect of care, questions were answered to patient's satisfaction.  #Neoplasm pain, continue oxycodone 5mg  Q6h as needed for pain.  #Unintentional weight loss, referral to nutritionist.   Recommend nutrition supplementation. #Right pleural effusion, plan repeat  Thoracentesis on 11/29 #Goals of care, discussed with patient that her disease likely incurable.  Treatment is with palliative intent.  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  She has no SVC obstruction symptoms..  Follow-up with vascular surgery. Appreciated recommendation upon Mediport placement.   Follow-up on D1 of chemotherapy.   [name], MD, PhD Hematology Oncology [facility] at [employer] Pager- [phone] 11/04/2020

## 2020-11-04 NOTE — Progress Notes (Signed)
Patient's pain has improved.

## 2020-11-04 NOTE — Progress Notes (Signed)
[place]  [phone] [phone]   Name: Brittney Fisher Date: 11/04/2020 MRN: [id number]  DOB: 1961-05-01  Patient Care Team: Ranae Plumber, [place] as PCP - General (Family Medicine) Telford Nab, RN as Oncology Nurse Navigator    REASON FOR CONSULTATION: Brittney Fisher is a 59 y.o. female with multiple medical problems including COPD recently started on home O2, ongoing tobacco abuse,.  Patient underwent right thoracentesis with removal of 2.4 L of fluid.  PET scan on 10/27/2020 revealed right upper lobe hypermetabolic mass with direct invasion of the mediastinum, metastatic disease to the right pleural space, lung, bone, nodes of the chest and upper abdomen, and SVC narrowing suspicious for occlusion and developing thrombus.  Patient was referred to palliative care to help address goals and manage ongoing symptoms.  [place] from below  SOCIAL HISTORY:     reports that she has been smoking cigarettes. She has been smoking about 0.20 packs per day. She has never used smokeless tobacco. She reports that she does not drink alcohol and does not use drugs.  Patient is widowed.  She has no children.  She lives at home with a niece.  She has another niece and sister who are involved in her care.  Patient previously worked at [employer].  ADVANCE DIRECTIVES:  Patient's niece, [name], is her healthcare power of attorney.  CODE STATUS:   PAST MEDICAL HISTORY: Past Medical History:  Diagnosis Date  . COPD (chronic obstructive pulmonary disease) ([place])   . GERD (gastroesophageal reflux disease)   . Metastatic lung cancer (metastasis from lung to other site) [facility]) 11/02/2020    PAST SURGICAL HISTORY:  Past Surgical History:  Procedure Laterality Date  . COLONOSCOPY WITH ESOPHAGOGASTRODUODENOSCOPY (EGD)    . COLONOSCOPY WITH PROPOFOL N/A 11/14/2015   Procedure: COLONOSCOPY WITH PROPOFOL;  Surgeon: Hulen Luster, MD;  Location:  [facility] ENDOSCOPY;  Service: Gastroenterology;  Laterality: N/A;  . ESOPHAGOGASTRODUODENOSCOPY (EGD) WITH PROPOFOL N/A 11/14/2015   Procedure: ESOPHAGOGASTRODUODENOSCOPY (EGD) WITH PROPOFOL;  Surgeon: Hulen Luster, MD;  Location: [facility] ENDOSCOPY;  Service: Gastroenterology;  Laterality: N/A;    HEMATOLOGY/ONCOLOGY HISTORY:  Oncology History  Metastatic lung cancer (metastasis from lung to other site) ([place])  11/02/2020 Initial Diagnosis   Metastatic lung cancer (metastasis from lung to other site) [facility])   11/08/2020 -  Chemotherapy   The patient had dexamethasone (DECADRON) 4 MG tablet, 1 of 1 cycle, Start date: 11/02/2020, End date: -- palonosetron (ALOXI) injection 0.25 mg, 0.25 mg, Intravenous,  Once, 0 of 1 cycle PEMEtrexed (ALIMTA) 900 mg in sodium chloride 0.9 % 100 mL chemo infusion, 500 mg/m2, Intravenous,  Once, 0 of 1 cycle CARBOplatin (PARAPLATIN) in sodium chloride 0.9 % 100 mL chemo infusion, , Intravenous,  Once, 0 of 1 cycle fosaprepitant (EMEND) 150 mg in sodium chloride 0.9 % 145 mL IVPB, 150 mg, Intravenous,  Once, 0 of 1 cycle bevacizumab-bvzr (ZIRABEV) 1,000 mg in sodium chloride 0.9 % 100 mL chemo infusion, 15 mg/kg, Intravenous,  Once, 0 of 1 cycle  for chemotherapy treatment.      ALLERGIES:  has No Known Allergies.  MEDICATIONS:  Current Outpatient Medications  Medication Sig Dispense Refill  . omeprazole (PRILOSEC) 20 MG capsule Take 20 mg by mouth daily.    . ondansetron (ZOFRAN) 8 MG tablet Take 1 tablet (8 mg total) by mouth 2 (two) times daily as needed for refractory nausea / vomiting. Start on day 3 after chemo. (Patient not taking:  Reported on 11/04/2020) 30 tablet 1  . oxyCODONE (OXY IR/ROXICODONE) 5 MG immediate release tablet Take 1 tablet (5 mg total) by mouth every 6 (six) hours as needed for severe pain. 32 tablet 0  . predniSONE (DELTASONE) 10 MG tablet Take 4 tablets (40 mg total) by mouth daily with breakfast. Take 40 mg daily--taper by 10 mg daily then  stop (Patient not taking: Reported on 11/04/2020) 10 tablet 0  . prochlorperazine (COMPAZINE) 10 MG tablet Take 1 tablet (10 mg total) by mouth every 6 (six) hours as needed (Nausea or vomiting). (Patient not taking: Reported on 11/04/2020) 30 tablet 1  . tiotropium (SPIRIVA HANDIHALER) 18 MCG inhalation capsule Place 18 mcg into inhaler and inhale daily.    Marland Kitchen acetaminophen (TYLENOL) 500 MG tablet Take 1,000 mg by mouth every 8 (eight) hours as needed for moderate pain. (Patient not taking: Reported on 11/04/2020)    . benzonatate (TESSALON) 100 MG capsule Take 1 capsule (100 mg total) by mouth 3 (three) times daily as needed for cough. 60 capsule 1  . dexamethasone (DECADRON) 4 MG tablet Take 1 tab (4 mg) twice a day the day before Alimta chemo. Take 2 tablets (8 mg) po daily x 3 days starting the day after chemo. (Patient not taking: Reported on 11/04/2020) 30 tablet 1  . feeding supplement (ENSURE ENLIVE / ENSURE PLUS) LIQD Take 237 mLs by mouth 2 (two) times daily between meals. 097 mL 12  . folic acid (FOLVITE) 1 MG tablet Take 1 tablet (1 mg total) by mouth daily. Start 5-7 days before Alimta chemotherapy. Continue until 21 days after Alimta completed. 100 tablet 3  . guaiFENesin (MUCINEX) 600 MG 12 hr tablet Take 1 tablet (600 mg total) by mouth 2 (two) times daily. 14 tablet 0  . ipratropium-albuterol (DUONEB) 0.5-2.5 (3) MG/3ML SOLN Take 3 mLs by nebulization 3 (three) times daily. 360 mL 1  . levofloxacin (LEVAQUIN) 750 MG tablet Take 1 tablet (750 mg total) by mouth daily. (Patient not taking: Reported on 11/04/2020) 5 tablet 0   No current facility-administered medications for this visit.    VITAL SIGNS: BP 103/61 (Patient Position: Sitting)   Pulse 100   Temp 100 F (37.8 C) (Tympanic)   Wt 147 lb 14.4 oz (67.1 kg)   BMI 20.63 kg/m  Filed Weights   11/04/20 1443  Weight: 147 lb 14.4 oz (67.1 kg)    Estimated body mass index is 20.63 kg/m as calculated from the following:    Height as of 10/31/20: '5\' 11"'  (1.803 m).   Weight as of this encounter: 147 lb 14.4 oz (67.1 kg).  LABS: CBC:    Component Value Date/Time   WBC 14.0 (H) 10/21/2020 0541   HGB 11.9 (L) 10/21/2020 0541   HCT 35.3 (L) 10/21/2020 0541   PLT 242 10/21/2020 0541   MCV 79.7 (L) 10/21/2020 0541   Comprehensive Metabolic Panel:    Component Value Date/Time   NA 134 (L) 10/21/2020 0541   K 3.9 10/21/2020 0541   CL 102 10/21/2020 0541   CO2 23 10/21/2020 0541   BUN 13 10/21/2020 0541   CREATININE 0.76 10/21/2020 0541   GLUCOSE 101 (H) 10/21/2020 0541   CALCIUM 8.5 (L) 10/21/2020 0541   AST 16 10/21/2020 0541   ALT 17 10/21/2020 0541   ALKPHOS 62 10/21/2020 0541   BILITOT 0.6 10/21/2020 0541   PROT 6.4 (L) 10/21/2020 0541   ALBUMIN 2.8 (L) 10/21/2020 0541    RADIOGRAPHIC STUDIES: CT CHEST  W CONTRAST  Result Date: 10/18/2020 CLINICAL DATA:  Malignant pleural effusion. Recently diagnosed with COPD. Abnormal chest radiograph. EXAM: CT CHEST WITH CONTRAST TECHNIQUE: Multidetector CT imaging of the chest was performed during intravenous contrast administration. CONTRAST:  3m ISOVUE-300 IOPAMIDOL (ISOVUE-300) INJECTION 61% COMPARISON:  10/05/2020 chest radiograph. FINDINGS: Cardiovascular: Advanced aortic and branch vessel atherosclerosis. Tortuous thoracic aorta. Normal heart size with minimal pericardial fluid. Left upper lobe pulmonary vein is surrounded and narrowed by adenopathy on 70/2. Pulmonary artery branches are also narrowed by adenopathy and the central right upper lobe lung mass. No pulmonary artery filling defect. SVC narrowing including on 60/2 and coronal image 62. Mediastinum/Nodes: Right supraclavicular/low jugular 1.5 cm node on 09/02. Massive thoracic adenopathy. Pretracheal mass/adenopathy measures 3.7 cm on 55/2. Subcarinal adenopathy measures 2.1 cm on 75/2. Extensive left hilar and infrahilar adenopathy, including a nodal mass of 1.6 x 3.7 cm on 70/2. Prevascular  adenopathy at 1.7 cm on 64/2. Juxta diaphragmatic left-sided nodes or nodules including at 9 mm on 147/2. Lungs/Pleura: Moderate to large right pleural effusion. Extensive irregular soft tissue thickening within the right pleural space, including on 83/2, consistent with pleural metastasis. Anterior right pleural implant measures 5.9 cm maximally on 122/2. Mild tracheal deviation to the right. Right upper lobe bronchial obstruction on 62/3. Right middle lobe endobronchial obstruction 80/3. Significant mass-effect upon right lower lobe bronchi with near complete obstruction. A central right upper lobe lung mass with direct mediastinal invasion is most apparent on soft tissue windows, including at 7.1 x 6.4 cm on 57/2. A combination of postobstructive and compressive atelectasis involves the majority of the remainder of the right lung. Extensive bilateral pulmonary nodules/metastasis. Index left upper lobe 1.0 cm nodule on 103/3. Index left lower lobe pulmonary nodule measures 1.0 cm on 111/3. Right lower lobe lung nodule of 11 mm on 110/3. Upper Abdomen: Motion degradation in the upper abdomen. Normal imaged portions of the liver, spleen, pancreas, gallbladder. Left greater than right, relatively mild adrenal thickening and nodularity. Proximal gastric underdistention. Upper pole 1.3 cm left renal cyst. Interpolar right renal too small to characterize lesion. Abdominal aortic atherosclerosis. Musculoskeletal: Multiple sclerotic foci, including within the sternal manubrium on sagittal image 97. Lytic lesion within the T2 vertebral body measures 11 mm on sagittal image 99. IMPRESSION: 1. 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 2. Metastatic disease to low cervical/thoracic nodes, lungs, right pleural space, and bones. 3. Moderate to large right pleural effusion. 4. SVC narrowing, suggesting impending SVC syndrome. Consider relatively urgent multidisciplinary thoracic  oncology consultation. These results will be called to the ordering clinician or representative by the Radiologist Assistant, and communication documented in the PACS or [employer] Electronically Signed   By: KAbigail MiyamotoM.D.   On: 10/18/2020 16:42   MR BRAIN W WO CONTRAST  Result Date: 10/21/2020 CLINICAL DATA:  New diagnosis lung cancer. Rule out metastatic disease. Mental status change. EXAM: MRI HEAD WITHOUT AND WITH CONTRAST TECHNIQUE: Multiplanar, multiecho pulse sequences of the brain and surrounding structures were obtained without and with intravenous contrast. CONTRAST:  768mGADAVIST GADOBUTROL 1 MMOL/ML IV SOLN COMPARISON:  None. FINDINGS: Brain: Negative for metastatic disease. No enhancing mass lesion in the brain. No cerebral edema identified. Negative for acute infarct. Scattered small white matter hyperintensities consistent with chronic microvascular ischemia. Negative for hemorrhage or mass lesion. Vascular: Normal arterial flow voids Skull and upper cervical spine: No focal skeletal lesion. Sinuses/Orbits: Paranasal sinuses clear.  Negative orbit Other: None IMPRESSION: Negative  for metastatic disease Mild chronic microvascular ischemic change in the white matter. Negative for acute infarct. Electronically Signed   By: Franchot Gallo M.D.   On: 10/21/2020 18:56   NM PET Image Initial (PI) Skull Base To Thigh  Result Date: 10/28/2020 CLINICAL DATA:  Initial treatment strategy for right upper lobe primary bronchogenic carcinoma. Malignant pleural effusion. EXAM: NUCLEAR MEDICINE PET SKULL BASE TO THIGH TECHNIQUE: 8.5 mCi F-18 FDG was injected intravenously. Full-ring PET imaging was performed from the skull base to thigh after the radiotracer. CT data was obtained and used for attenuation correction and anatomic localization. Fasting blood glucose: 91 mg/dl COMPARISON:  Chest CT 10/18/2020 FINDINGS: Mediastinal blood pool activity: SUV max 1.9 Liver activity: SUV max Not applicable.  NECK: Hypermetabolic low right jugular/supraclavicular node measures 1.5 cm and a S.U.V. max of 7.0 on 59/3. CT findings: none CHEST: Central right upper lobe lung mass with direct mediastinal invasion. This measured 7.1 x 6.4 cm on prior diagnostic CT and a S.U.V. max of 9.6 on 86/3 today. Extensive right-sided pleural metastasis. The index implant on the prior CT corresponds to hypermetabolism, including at a S.U.V. max of 7.4 on 130/3 today. Left suprahilar node measures 1.9 cm and a S.U.V. max of 8.6 on 87/3. Left infrahilar adenopathy at 1.6 cm and a S.U.V. max of 8.1 on 107/3. Extensive hypermetabolic pulmonary metastasis. Example in the left lower lobe at 1.7 cm and a S.U.V. max of 4.4 on 117/3. Hypermetabolism along the course of the right axillary vein, including at a S.U.V. max of 5.5 on 65/3. The vein is subtly hyperattenuating, and followed centrally to the level of SVC narrowing, including on 73/3. Incidental CT findings: Deferred to recent diagnostic CT. Moderate right pleural effusion is not significantly changed. Similar small pericardial effusion. ABDOMEN/PELVIS: A node adjacent to the right adrenal measures 5 mm and a S.U.V. max of 4.8 on 151/3. No abdominopelvic parenchymal hypermetabolism identified. Incidental CT findings: Abdominal aortic atherosclerosis. Bilateral adrenal thickening suggesting hyperplasia. Large colonic stool burden. SKELETON: Multifocal osseous metastasis. Index left posterior iliac lesion measures 8 mm and a S.U.V. max of 8.8 on 203/3. T2 lytic lesion is eccentric left at 11 mm and a S.U.V. max of 10.1 on 60/3. Incidental CT findings: none IMPRESSION: 1. Right upper lobe primary bronchogenic carcinoma with direct mediastinal invasion. 2. Metastatic disease to right pleural space, lungs, bones, nodes of the chest and less so lower neck/upper abdomen. 3. Hypermetabolism along the course of the right axillary vein with concurrent subtle hyperattenuation within the axillary vein  and SVC. This continues to the level of SVC narrowing, suspicious for SVC occlusion and developing thrombus. Recommend clinical correlation for SVC syndrome. 4. Moderate right pleural effusion and small pericardial effusion, similar. Electronically Signed   By: Abigail Miyamoto M.D.   On: 10/28/2020 09:55   DG Chest Port 1 View  Result Date: 10/31/2020 CLINICAL DATA:  Right thoracentesis EXAM: PORTABLE CHEST 1 VIEW COMPARISON:  10/20/2020 FINDINGS: Right hilar mass with upper lobe collapse/replacement. Multiple pulmonary nodules. Small right pleural effusion in the frontal projection. No evidence of pneumothorax. Stable heart size. IMPRESSION: Stable from 10/20/2020.  No complicating features of thoracentesis. Electronically Signed   By: Monte Fantasia M.D.   On: 10/31/2020 09:14   DG Chest Port 1 View  Result Date: 10/20/2020 CLINICAL DATA:  No known primary, now with concern for malignant right-sided pleural effusion post thoracentesis. EXAM: PORTABLE CHEST 1 VIEW COMPARISON:  Chest radiograph-earlier same day; chest CT-10/18/2020 FINDINGS: Interval reduction  in persistent trace right-sided effusion post thoracentesis. No pneumothorax. Improved aeration of the right mid and lower lung with persistent right basilar consolidative opacities, likely atelectasis. Redemonstrated extensive bilateral nodular opacities with obscuration of the right heart border secondary to persistent atelectasis/collapse of the right upper lobe. No left-sided pleural effusion or pneumothorax. No evidence of edema. No acute osseous abnormalities. IMPRESSION: 1. Interval reduction in persistent trace right-sided effusion post thoracentesis. No pneumothorax. 2. Improved aeration of the right mid and lower lung with persistent right basilar opacities, likely atelectasis. 3. Similar findings of right upper lobe collapse and extensive metastatic disease involving the lungs bilaterally as better demonstrated recent chest CT. Electronically  Signed   By: Sandi Mariscal M.D.   On: 10/20/2020 12:17   DG Chest Port 1 View  Result Date: 10/20/2020 CLINICAL DATA:  Shortness of breath EXAM: PORTABLE CHEST 1 VIEW COMPARISON:  10/04/2020.  Chest CT from 2 days ago FINDINGS: Large right pleural effusion obscuring most of the right lung. Reticulonodular opacity on the left due to multiple pulmonary nodules. Normal heart size. Thoracic adenopathy by CT. IMPRESSION: Extensive intrathoracic malignancy by recent chest CT. Very large right pleural effusion. Electronically Signed   By: Monte Fantasia M.D.   On: 10/20/2020 04:41   [place] CORE BIOPSY (LYMPH NODES)  Result Date: 10/31/2020 INDICATION: Lung carcinoma.  Hypermetabolic right supraclavicular adenopathy. EXAM: ULTRASOUND GUIDED CORE BIOPSY OF RIGHT SUPRACLAVICULAR ADENOPATHY MEDICATIONS: No antibiotics indicated ANESTHESIA/SEDATION: Lidocaine 1% subcutaneous PROCEDURE: The procedure, risks, benefits, and alternatives were explained to the patient. Questions regarding the procedure were encouraged and answered. The patient understands and consents to the procedure. Survey ultrasound of the right supraclavicular region was performed. The adenopathy was localized and an appropriate skin entry site was determined and marked. Thrombosis of the right IJ vein incidentally noted. The operative field was prepped with chlorhexidine in a sterile fashion, and a sterile drape was applied covering the operative field. A sterile gown and sterile gloves were used for the procedure. Local anesthesia was provided with 1% Lidocaine. Under real-time ultrasound guidance, multiple 18-gauge core biopsy samples were obtained, submitted in saline on Telfa to surgical pathology. The guide needle was removed. Postprocedure scans show no hemorrhage or other apparent complication. The patient tolerated the procedure well. COMPLICATIONS: None immediate. FINDINGS: Right supraclavicular adenopathy was localized. Representative core biopsy  samples obtained as above. Thrombosis of the right IJ vein incidentally noted. IMPRESSION: 1. Technically successful ultrasound-guided core biopsy, right supraclavicular adenopathy. 2. Right IJ venous thrombosis incidentally noted. Electronically Signed   By: Lucrezia Europe M.D.   On: 10/31/2020 11:01   US THORACENTESIS ASP PLEURAL SPACE W/IMG GUIDE  Result Date: 10/31/2020 INDICATION: Right lung carcinoma. Pleural metastases and recurrent symptomatic effusion EXAM: ULTRASOUND GUIDED RIGHT THORACENTESIS MEDICATIONS: Lidocaine 1% subcutaneous COMPLICATIONS: None immediate. Postprocedure chest radiograph shows no pneumothorax. PROCEDURE: An ultrasound guided thoracentesis was thoroughly discussed with the patient and questions answered. The benefits, risks, alternatives and complications were also discussed. The patient understands and wishes to proceed with the procedure. Written consent was obtained. Ultrasound was performed to localize and mark an adequate pocket of fluid in the right chest. The area was then prepped and draped in the normal sterile fashion. 1% Lidocaine was used for local anesthesia. Under ultrasound guidance a 6 Fr Safe-T-Centesis catheter was introduced. Thoracentesis was performed. The catheter was removed and a dressing applied. FINDINGS: A total of approximately 2.3 L of red tinged fluid was removed. Samples were sent to the laboratory as requested by  the clinical team. IMPRESSION: Successful ultrasound guided right thoracentesis yielding 2.3 L of pleural fluid. Electronically Signed   By: Lucrezia Europe M.D.   On: 10/31/2020 11:02   US THORACENTESIS ASP PLEURAL SPACE W/IMG GUIDE  Result Date: 10/20/2020 INDICATION: No known primary, now with concern for malignant right-sided pleural effusion. Please perform ultrasound-guided thoracentesis for diagnostic and therapeutic purposes. EXAM: US THORACENTESIS ASP PLEURAL SPACE W/IMG GUIDE COMPARISON:  Chest CT-10/18/2020 MEDICATIONS: None.  COMPLICATIONS: None immediate. TECHNIQUE: Informed written consent was obtained from the patient after a discussion of the risks, benefits and alternatives to treatment. A timeout was performed prior to the initiation of the procedure. Initial ultrasound scanning demonstrates a large complex right-sided pleural effusion with large amount floating echogenic debris within the pleural fluid but without definitive loculations. The lower chest was prepped and draped in the usual sterile fashion. 1% lidocaine was used for local anesthesia. An ultrasound image was saved for documentation purposes. An 8 Fr Safe-T-Centesis catheter was introduced. The thoracentesis was performed. The catheter was removed and a dressing was applied. The patient tolerated the procedure well without immediate post procedural complication. The patient was escorted to have an upright chest radiograph. FINDINGS: A total of approximately 2.4 liters of serous fluid was removed. Requested samples were sent to the laboratory. IMPRESSION: Successful ultrasound-guided right sided thoracentesis yielding 2.4 liters of pleural fluid. Electronically Signed   By: Sandi Mariscal M.D.   On: 10/20/2020 12:15    PERFORMANCE STATUS (ECOG) : 1 - Symptomatic but completely ambulatory  Review of Systems Unless otherwise noted, a complete review of systems is negative.  Physical Exam General: NAD, thin Pulmonary: Unlabored Extremities: no edema, no joint deformities Skin: no rashes Neurological: Weakness but otherwise nonfocal  IMPRESSION: Met with patient and her niece, [name].  Introduced palliative care services and attempted to establish therapeutic rapport.  Overall, patient reports that she is doing reasonably well.  She has improved appetite and pain is stable on oxycodone.  Patient denies other symptomatic complaints.  At baseline, patient lives at home with her niece.  However, she is independent with her own care.  Patient says that she  feels comfortable with the plan to proceed with XRT and chemotherapy.  She says that she has previously completed ACP documents.  Her niece, [name], is her healthcare power of attorney.  PLAN: -Continue current scope of treatment -Continue oxycodone as needed for pain -Prophylactic bowel regimen -Nutritional supplements -May benefit from RD consult -She will benefit from completion of a MOST form -RTC in 3 to 4 weeks   Patient expressed understanding and was in agreement with this plan. She also understands that She can call the clinic at any time with any questions, concerns, or complaints.     Time Total: 20 minutes  Visit consisted of counseling and education dealing with the complex and emotionally intense issues of symptom management and palliative care in the setting of serious and potentially life-threatening illness.Greater than 50%  of this time was spent counseling and coordinating care related to the above assessment and plan.  Signed by: Altha Harm, PhD, NP-C

## 2020-11-05 MED ORDER — OXYCODONE HCL 5 MG PO TABS: 5.0000 mg | ORAL_TABLET | Freq: Four times a day (QID) | ORAL | 0 refills | Status: DC | PRN

## 2020-11-07 ENCOUNTER — Inpatient Hospital Stay: Payer: BC Managed Care – PPO

## 2020-11-07 ENCOUNTER — Other Ambulatory Visit: Payer: Self-pay

## 2020-11-07 ENCOUNTER — Inpatient Hospital Stay (HOSPITAL_BASED_OUTPATIENT_CLINIC_OR_DEPARTMENT_OTHER): Payer: BC Managed Care – PPO | Admitting: Oncology

## 2020-11-07 ENCOUNTER — Other Ambulatory Visit: Payer: Self-pay | Admitting: *Deleted

## 2020-11-07 DIAGNOSIS — C3490 Malignant neoplasm of unspecified part of unspecified bronchus or lung: Secondary | ICD-10-CM

## 2020-11-07 MED ORDER — CYANOCOBALAMIN 1000 MCG/ML IJ SOLN
1000.0000 ug | Freq: Once | INTRAMUSCULAR | Status: AC
  Administered 2020-11-07: 1000 ug via INTRAMUSCULAR
  Filled 2020-11-07: qty 1

## 2020-11-07 NOTE — Progress Notes (Signed)
  Oncology Nurse Navigator Documentation  Navigator Location: [facility]Med Onc (11/07/20 1100)   )Navigator Encounter Type: Follow-up Appt (11/07/20 1100)     Confirmed Diagnosis Date: 11/01/20 (11/07/20 1100)               Patient Visit Type: MedOnc (11/07/20 1100) Treatment Phase: Pre-Tx/Tx Discussion (11/07/20 1100) Barriers/Navigation Needs: Coordination of Care;Education (11/07/20 1100) Education: Newly Diagnosed Cancer Education;Understanding Cancer/ Treatment Options (11/07/20 1100) Interventions: Coordination of Care;Education (11/07/20 1100)   Coordination of Care: Appts;Chemo;Radiology (11/07/20 1100) Education Method: Verbal;Written (11/07/20 1100)         met with pt and her niece during follow up visit with Dr. [name] to discuss biopsy results and treatment options. Pt given resources regarding diagnosis and supportive services available. All questions answered during visit. Instructed to call with any further questions or needs. Pt and her niece verbalized understanding. Nothing further needed at this time.       Time Spent with Patient: 60 (11/07/20 1100)

## 2020-11-08 ENCOUNTER — Encounter: Payer: Self-pay | Admitting: Radiation Oncology

## 2020-11-08 ENCOUNTER — Other Ambulatory Visit: Payer: Self-pay

## 2020-11-08 ENCOUNTER — Ambulatory Visit
Admission: RE | Admit: 2020-11-08 | Discharge: 2020-11-08 | Disposition: A | Discharge status: Home / Self Care | Payer: BC Managed Care – PPO | Source: Ambulatory Visit | Attending: Radiation Oncology | Admitting: Radiation Oncology

## 2020-11-08 DIAGNOSIS — J449 Chronic obstructive pulmonary disease, unspecified: Secondary | ICD-10-CM | POA: Insufficient documentation

## 2020-11-08 DIAGNOSIS — Z79899 Other long term (current) drug therapy: Secondary | ICD-10-CM | POA: Insufficient documentation

## 2020-11-08 DIAGNOSIS — C771 Secondary and unspecified malignant neoplasm of intrathoracic lymph nodes: Secondary | ICD-10-CM | POA: Insufficient documentation

## 2020-11-08 DIAGNOSIS — K219 Gastro-esophageal reflux disease without esophagitis: Secondary | ICD-10-CM | POA: Diagnosis not present

## 2020-11-08 DIAGNOSIS — C3411 Malignant neoplasm of upper lobe, right bronchus or lung: Secondary | ICD-10-CM | POA: Diagnosis not present

## 2020-11-08 DIAGNOSIS — F17210 Nicotine dependence, cigarettes, uncomplicated: Secondary | ICD-10-CM | POA: Insufficient documentation

## 2020-11-08 DIAGNOSIS — C3490 Malignant neoplasm of unspecified part of unspecified bronchus or lung: Secondary | ICD-10-CM

## 2020-11-08 NOTE — Consult Note (Signed)
NEW PATIENT EVALUATION  Name: Brittney Fisher  MRN: [id number]  Date:   11/08/2020     DOB: [birth year]   This 59 y.o. 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  REFERRING PHYSICIAN: Ranae Plumber, [place]  CHIEF COMPLAINT:  Chief Complaint  Patient presents with  . Lung Cancer    Initial consultation    DIAGNOSIS: The encounter diagnosis was Primary malignant neoplasm of lung metastatic to other site, unspecified laterality ([place]).   PREVIOUS INVESTIGATIONS:  PET CT scan CT scans reviewed Clinical notes reviewed Pathology report reviewed  HPI: Patient is a 59 year old female who presented to the emergency room with overall weakness dizziness and some chest pain.  Her initial imaging showed a right-sided pleural effusion and underwent thoracentesis with removal of 2.4 L of fluid.  CT scan of the chest 11 2 showed central right upper lobe pulmonary bronchogenic carcinoma with direct mediastinal invasion.  She had involvement of low cervical and thoracic nodes lung right pleural space and bone.  Also was noted was SVC narrowing.  MRI brain was negative.  PET CT scan was performed 6144 showing hypermetabolic activity in the right upper lobe primary bronchogenic mass with direct extension into the mediastinum.  She also involvement of the right pleural space lung bone.  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  She underwent biopsy of her left supraclavicular mass which was positive for adenocarcinoma.  She is slated to begin chemotherapy is now referred to radiation oncology for palliative possible palliative radiation to her SVC.  She is in no significant pain at this time has no venous jugular distention or facial plethora.  She does have some prominent veins on her anterior chest  which may be collateral venous circulation.  PLANNED TREATMENT REGIMEN: Palliative radiation therapy to chest  PAST MEDICAL HISTORY:  has a past medical history of COPD (chronic obstructive pulmonary disease) ([place]), GERD (gastroesophageal reflux disease), and Metastatic lung cancer (metastasis from lung to other site) ([place]) (11/02/2020).    PAST SURGICAL HISTORY:  Past Surgical History:  Procedure Laterality Date  . COLONOSCOPY WITH ESOPHAGOGASTRODUODENOSCOPY (EGD)    . COLONOSCOPY WITH PROPOFOL N/A 11/14/2015   Procedure: COLONOSCOPY WITH PROPOFOL;  Surgeon: Hulen Luster, MD;  Location: [facility] ENDOSCOPY;  Service: Gastroenterology;  Laterality: N/A;  . ESOPHAGOGASTRODUODENOSCOPY (EGD) WITH PROPOFOL N/A 11/14/2015   Procedure: ESOPHAGOGASTRODUODENOSCOPY (EGD) WITH PROPOFOL;  Surgeon: Hulen Luster, MD;  Location: [facility] ENDOSCOPY;  Service: Gastroenterology;  Laterality: N/A;    FAMILY HISTORY: family history includes Breast cancer (age of onset: 23) in her cousin; Colon cancer in her father; Diabetes type II in her mother; Hypertension in her father and mother.  SOCIAL HISTORY:  reports that she has been smoking cigarettes. She has been smoking about 0.20 packs per day. She has never used smokeless tobacco. She reports that she does not drink alcohol and does not use drugs.  ALLERGIES: Patient has no known allergies.  MEDICATIONS:  Current Outpatient Medications  Medication Sig Dispense Refill  . benzonatate (TESSALON) 100 MG capsule Take 1 capsule (100 mg total) by mouth 3 (three) times daily as needed for cough. 60 capsule 1  . feeding supplement (ENSURE ENLIVE / ENSURE PLUS) LIQD Take 237 mLs by mouth 2 (two) times daily between meals. 315 mL 12  . folic acid (FOLVITE) 1 MG  tablet Take 1 tablet (1 mg total) by mouth daily. Start 5-7 days before Alimta chemotherapy. Continue until 21 days after Alimta completed. 100 tablet 3  . guaiFENesin (MUCINEX) 600 MG 12 hr tablet Take 1 tablet (600 mg  total) by mouth 2 (two) times daily. 14 tablet 0  . ipratropium-albuterol (DUONEB) 0.5-2.5 (3) MG/3ML SOLN Take 3 mLs by nebulization 3 (three) times daily. 360 mL 1  . lidocaine-prilocaine (EMLA) cream Apply to affected area once 30 g 3  . omeprazole (PRILOSEC) 20 MG capsule Take 20 mg by mouth daily.    Derrill Memo ON 11/09/2020] oxyCODONE (OXY IR/ROXICODONE) 5 MG immediate release tablet Take 1 tablet (5 mg total) by mouth every 6 (six) hours as needed for severe pain. 60 tablet 0  . tiotropium (SPIRIVA HANDIHALER) 18 MCG inhalation capsule Place 18 mcg into inhaler and inhale daily.    Marland Kitchen acetaminophen (TYLENOL) 500 MG tablet Take 1,000 mg by mouth every 8 (eight) hours as needed for moderate pain. (Patient not taking: Reported on 11/04/2020)    . dexamethasone (DECADRON) 4 MG tablet Take 1 tab (4 mg) twice a day the day before Alimta chemo. Take 2 tablets (8 mg) po daily x 3 days starting the day after chemo. (Patient not taking: Reported on 11/04/2020) 30 tablet 1  . ondansetron (ZOFRAN) 8 MG tablet Take 1 tablet (8 mg total) by mouth 2 (two) times daily as needed for refractory nausea / vomiting. Start on day 3 after chemo. (Patient not taking: Reported on 11/04/2020) 30 tablet 1  . prochlorperazine (COMPAZINE) 10 MG tablet Take 1 tablet (10 mg total) by mouth every 6 (six) hours as needed (Nausea or vomiting). (Patient not taking: Reported on 11/04/2020) 30 tablet 1   No current facility-administered medications for this encounter.    ECOG PERFORMANCE STATUS:  1 - Symptomatic but completely ambulatory  REVIEW OF SYSTEMS: Patient denies any weight loss, fatigue, weakness, fever, chills or night sweats. Patient denies any loss of vision, blurred vision. Patient denies any ringing  of the ears or hearing loss. No irregular heartbeat. Patient denies heart murmur or history of fainting. Patient denies any chest pain or pain radiating to her upper extremities. Patient denies any shortness of breath,  difficulty breathing at night, cough or hemoptysis. Patient denies any swelling in the lower legs. Patient denies any nausea vomiting, vomiting of blood, or coffee ground material in the vomitus. Patient denies any stomach pain. Patient states has had normal bowel movements no significant constipation or diarrhea. Patient denies any dysuria, hematuria or significant nocturia. Patient denies any problems walking, swelling in the joints or loss of balance. Patient denies any skin changes, loss of hair or loss of weight. Patient denies any excessive worrying or anxiety or significant depression. Patient denies any problems with insomnia. Patient denies excessive thirst, polyuria, polydipsia. Patient denies any swollen glands, patient denies easy bruising or easy bleeding. Patient denies any recent infections, allergies or URI. Patient "s visual fields have not changed significantly in recent time.   PHYSICAL EXAM: BP (P) 119/70 (BP Location: Left Arm, Patient Position: Sitting)   Pulse (!) (P) 109   Temp (P) 98.5 F (36.9 C) (Tympanic)   Wt (P) 147 lb 4.8 oz (66.8 kg)   BMI (P) 20.54 kg/m  Does have some prominent veins on her anterior chest which may be collaterals.  Well-developed well-nourished patient in NAD. HEENT reveals PERLA, EOMI, discs not visualized.  Oral cavity is clear. No oral mucosal lesions are  identified. Neck is clear without evidence of cervical or supraclavicular adenopathy. Lungs are clear to A&P. Cardiac examination is essentially unremarkable with regular rate and rhythm without murmur rub or thrill. Abdomen is benign with no organomegaly or masses noted. Motor sensory and DTR levels are equal and symmetric in the upper and lower extremities. Cranial nerves II through XII are grossly intact. Proprioception is intact. No peripheral adenopathy or edema is identified. No motor or sensory levels are noted. Crude visual fields are within normal range.  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  This is a large complex mass in her right upper lobe extending into the mediastinum.  I will start her on palliative radiation therapy treat up to 40 Gray over 4 weeks and evaluate for response.  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  Risks and benefits of treatment including increased cough possible rate esophagitis fatigue alteration of blood count skin reaction all were reviewed in detail with the patient and her niece.  They both seem to comprehend my treatment plan well.  There will be extra effort by both professional staff as well as technical staff to coordinate and manage concurrent chemoradiation and ensuing side effects during her treatments.  I would like to take this opportunity to thank you for allowing me to participate in the care of your patient.Noreene Filbert, MD

## 2020-11-08 NOTE — Progress Notes (Signed)
Brittney Fisher  [phone] [phone]  Patient Care Team: Brittney Fisher, [place] as PCP - General (Family Medicine) Brittney Nab, RN as Oncology Nurse Navigator   Name of the patient: Brittney Fisher  [id number]  1961-09-24   Date of visit: 11/08/20  Diagnosis-lung cancer  Chief complaint/Reason for visit- Initial Meeting for [facility], preparing for starting chemotherapy  I connected with Brittney Fisher on 11/08/20 at 11:30 AM EST by telephone visit and verified that I am speaking with the correct person using two identifiers.   I discussed the limitations, risks, security and privacy concerns of performing an evaluation and management service by telemedicine and the availability of in-person appointments. I also discussed with the patient that there may be a patient responsible charge related to this service. The patient expressed understanding and agreed to proceed.   Other persons participating in the visit and their role in the encounter: None  Patient's location: home Provider's location: clinic  Heme/Onc history:  Oncology History  Metastatic lung cancer (metastasis from lung to other site) ([place])  11/02/2020 Initial Diagnosis   Metastatic lung cancer (metastasis from lung to other site) [facility])   11/08/2020 -  Chemotherapy   The patient had dexamethasone (DECADRON) 4 MG tablet, 1 of 1 cycle, Start date: 11/02/2020, End date: -- palonosetron (ALOXI) injection 0.25 mg, 0.25 mg, Intravenous,  Once, 0 of 5 cycles PEMEtrexed (ALIMTA) 900 mg in sodium chloride 0.9 % 100 mL chemo infusion, 500 mg/m2, Intravenous,  Once, 0 of 5 cycles CARBOplatin (PARAPLATIN) in sodium chloride 0.9 % 100 mL chemo infusion, , Intravenous,  Once, 0 of 5 cycles fosaprepitant (EMEND) 150 mg in sodium chloride 0.9 % 145 mL IVPB, 150 mg, Intravenous,  Once, 0 of 5 cycles bevacizumab-bvzr (ZIRABEV) 1,000 mg in sodium chloride  0.9 % 100 mL chemo infusion, 15 mg/kg, Intravenous,  Once, 0 of 5 cycles  for chemotherapy treatment.    Primary malignant neoplasm of lung metastatic to other site [facility])  11/04/2020 Initial Diagnosis   Primary malignant neoplasm of lung metastatic to other site [facility])   11/04/2020 Cancer Staging   Staging form: Lung, AJCC 8th Edition - Clinical stage from 11/04/2020: Stage IV (cT4, cN3, cM1) - Signed by [name], MD on 11/04/2020     Interval history-Brittney Fisher is a 59 year old female who presents to chemo care clinic today for initial meeting in preparation for starting chemotherapy. I introduced the chemo care clinic and we discussed that the role of the clinic is to assist those who are at an increased risk of emergency room visits and/or complications during the course of chemotherapy treatment. We discussed that the increased risk takes into account factors such as age, performance status, and co-morbidities. We also discussed that for some, this might include barriers to care such as not having a primary care provider, lack of insurance/transportation, or not being able to afford medications. We discussed that the goal of the program is to help prevent unplanned ER visits and help reduce complications during chemotherapy. We do this by discussing specific risk factors to each individual and identifying ways that we can help improve these risk factors and reduce barriers to care.   ECOG FS:0 - Asymptomatic  Review of systems- Review of Systems  Constitutional: Positive for malaise/fatigue. Negative for chills, fever and weight loss.  HENT: Negative for congestion, ear pain and tinnitus.   Eyes: Negative.  Negative for blurred vision and double vision.  Respiratory: Negative.  Negative for cough, sputum production and shortness of breath.   Cardiovascular: Positive for leg swelling (L > R). Negative for chest pain and palpitations.  Gastrointestinal: Negative.  Negative for abdominal  pain, constipation, diarrhea, nausea and vomiting.  Genitourinary: Negative for dysuria, frequency and urgency.  Musculoskeletal: Negative for back pain and falls.  Skin: Negative.  Negative for rash.  Neurological: Negative.  Negative for weakness and headaches.  Endo/Heme/Allergies: Negative.  Does not bruise/bleed easily.  Psychiatric/Behavioral: Negative.  Negative for depression. The patient is not nervous/anxious and does not have insomnia.      Current treatment-   No Known Allergies  Past Medical History:  Diagnosis Date  . COPD (chronic obstructive pulmonary disease) ([place])   . GERD (gastroesophageal reflux disease)   . Metastatic lung cancer (metastasis from lung to other site) [facility]) 11/02/2020    Past Surgical History:  Procedure Laterality Date  . COLONOSCOPY WITH ESOPHAGOGASTRODUODENOSCOPY (EGD)    . COLONOSCOPY WITH PROPOFOL N/A 11/14/2015   Procedure: COLONOSCOPY WITH PROPOFOL;  Surgeon: Hulen Luster, MD;  Location: [facility] ENDOSCOPY;  Service: Gastroenterology;  Laterality: N/A;  . ESOPHAGOGASTRODUODENOSCOPY (EGD) WITH PROPOFOL N/A 11/14/2015   Procedure: ESOPHAGOGASTRODUODENOSCOPY (EGD) WITH PROPOFOL;  Surgeon: Hulen Luster, MD;  Location: [facility] ENDOSCOPY;  Service: Gastroenterology;  Laterality: N/A;    Social History   Socioeconomic History  . Marital status: Married    Spouse name: Not on file  . Number of children: Not on file  . Years of education: Not on file  . Highest education level: Not on file  Occupational History  . Not on file  Tobacco Use  . Smoking status: Current Every Day Smoker    Packs/day: 0.20    Types: Cigarettes  . Smokeless tobacco: Never Used  Substance and Sexual Activity  . Alcohol use: Never  . Drug use: Never  . Sexual activity: Not Currently  Other Topics Concern  . Not on file  Social History Narrative  . Not on file   Social Determinants of Health   Financial Resource Strain:   . Difficulty of Paying Living Expenses: Not  on file  Food Insecurity:   . Worried About Charity fundraiser in the Last Year: Not on file  . Ran Out of Food in the Last Year: Not on file  Transportation Needs:   . Lack of Transportation (Medical): Not on file  . Lack of Transportation (Non-Medical): Not on file  Physical Activity:   . Days of Exercise per Week: Not on file  . Minutes of Exercise per Session: Not on file  Stress:   . Feeling of Stress : Not on file  Social Connections:   . Frequency of Communication with Friends and Family: Not on file  . Frequency of Social Gatherings with Friends and Family: Not on file  . Attends Religious Services: Not on file  . Active Member of Clubs or Organizations: Not on file  . Attends Archivist Meetings: Not on file  . Marital Status: Not on file  Intimate Partner Violence:   . Fear of Current or Ex-Partner: Not on file  . Emotionally Abused: Not on file  . Physically Abused: Not on file  . Sexually Abused: Not on file    Family History  Problem Relation Age of Onset  . Breast cancer Cousin 55  . Diabetes type II Mother   . Hypertension Mother   . Colon cancer Father   . Hypertension Father  Current Outpatient Medications:  .  acetaminophen (TYLENOL) 500 MG tablet, Take 1,000 mg by mouth every 8 (eight) hours as needed for moderate pain. (Patient not taking: Reported on 11/04/2020), Disp: , Rfl:  .  benzonatate (TESSALON) 100 MG capsule, Take 1 capsule (100 mg total) by mouth 3 (three) times daily as needed for cough., Disp: 60 capsule, Rfl: 1 .  dexamethasone (DECADRON) 4 MG tablet, Take 1 tab (4 mg) twice a day the day before Alimta chemo. Take 2 tablets (8 mg) po daily x 3 days starting the day after chemo. (Patient not taking: Reported on 11/04/2020), Disp: 30 tablet, Rfl: 1 .  feeding supplement (ENSURE ENLIVE / ENSURE PLUS) LIQD, Take 237 mLs by mouth 2 (two) times daily between meals., Disp: 237 mL, Rfl: 12 .  folic acid (FOLVITE) 1 MG tablet, Take 1  tablet (1 mg total) by mouth daily. Start 5-7 days before Alimta chemotherapy. Continue until 21 days after Alimta completed., Disp: 100 tablet, Rfl: 3 .  guaiFENesin (MUCINEX) 600 MG 12 hr tablet, Take 1 tablet (600 mg total) by mouth 2 (two) times daily., Disp: 14 tablet, Rfl: 0 .  ipratropium-albuterol (DUONEB) 0.5-2.5 (3) MG/3ML SOLN, Take 3 mLs by nebulization 3 (three) times daily., Disp: 360 mL, Rfl: 1 .  lidocaine-prilocaine (EMLA) cream, Apply to affected area once, Disp: 30 g, Rfl: 3 .  omeprazole (PRILOSEC) 20 MG capsule, Take 20 mg by mouth daily., Disp: , Rfl:  .  ondansetron (ZOFRAN) 8 MG tablet, Take 1 tablet (8 mg total) by mouth 2 (two) times daily as needed for refractory nausea / vomiting. Start on day 3 after chemo. (Patient not taking: Reported on 11/04/2020), Disp: 30 tablet, Rfl: 1 .  [START ON 11/09/2020] oxyCODONE (OXY IR/ROXICODONE) 5 MG immediate release tablet, Take 1 tablet (5 mg total) by mouth every 6 (six) hours as needed for severe pain., Disp: 60 tablet, Rfl: 0 .  prochlorperazine (COMPAZINE) 10 MG tablet, Take 1 tablet (10 mg total) by mouth every 6 (six) hours as needed (Nausea or vomiting). (Patient not taking: Reported on 11/04/2020), Disp: 30 tablet, Rfl: 1 .  tiotropium (SPIRIVA HANDIHALER) 18 MCG inhalation capsule, Place 18 mcg into inhaler and inhale daily., Disp: , Rfl:   Physical exam: There were no vitals filed for this visit. Physical Exam  -Limited secondary to telephone visit -In no acute distress. speaking in full sentences.  CMP Latest Ref Rng & Units 10/21/2020  Glucose 70 - 99 mg/dL 101(H)  BUN 8 - 23 mg/dL 13  Creatinine 0.44 - 1.00 mg/dL 0.76  Sodium 135 - 145 mmol/L 134(L)  Potassium 3.5 - 5.1 mmol/L 3.9  Chloride 98 - 111 mmol/L 102  CO2 22 - 32 mmol/L 23  Calcium 8.9 - 10.3 mg/dL 8.5(L)  Total Protein 6.5 - 8.1 g/dL 6.4(L)  Total Bilirubin 0.3 - 1.2 mg/dL 0.6  Alkaline Phos 38 - 126 U/L 62  AST 15 - 41 U/L 16  ALT 0 - 44 U/L 17    CBC Latest Ref Rng & Units 10/21/2020  WBC 4.0 - 10.5 K/uL 14.0(H)  Hemoglobin 12.0 - 15.0 g/dL 11.9(L)  Hematocrit 36 - 46 % 35.3(L)  Platelets 150 - 400 K/uL 242    No images are attached to the encounter.  CT CHEST W CONTRAST  Result Date: 10/18/2020 CLINICAL DATA:  Malignant pleural effusion. Recently diagnosed with COPD. Abnormal chest radiograph. EXAM: CT CHEST WITH CONTRAST TECHNIQUE: Multidetector CT imaging of the chest was performed during intravenous  contrast administration. CONTRAST:  95mL ISOVUE-300 IOPAMIDOL (ISOVUE-300) INJECTION 61% COMPARISON:  10/05/2020 chest radiograph. FINDINGS: Cardiovascular: Advanced aortic and branch vessel atherosclerosis. Tortuous thoracic aorta. Normal heart size with minimal pericardial fluid. Left upper lobe pulmonary vein is surrounded and narrowed by adenopathy on 70/2. Pulmonary artery branches are also narrowed by adenopathy and the central right upper lobe lung mass. No pulmonary artery filling defect. SVC narrowing including on 60/2 and coronal image 62. Mediastinum/Nodes: Right supraclavicular/low jugular 1.5 cm node on 09/02. Massive thoracic adenopathy. Pretracheal mass/adenopathy measures 3.7 cm on 55/2. Subcarinal adenopathy measures 2.1 cm on 75/2. Extensive left hilar and infrahilar adenopathy, including a nodal mass of 1.6 x 3.7 cm on 70/2. Prevascular adenopathy at 1.7 cm on 64/2. Juxta diaphragmatic left-sided nodes or nodules including at 9 mm on 147/2. Lungs/Pleura: Moderate to large right pleural effusion. Extensive irregular soft tissue thickening within the right pleural space, including on 83/2, consistent with pleural metastasis. Anterior right pleural implant measures 5.9 cm maximally on 122/2. Mild tracheal deviation to the right. Right upper lobe bronchial obstruction on 62/3. Right middle lobe endobronchial obstruction 80/3. Significant mass-effect upon right lower lobe bronchi with near complete obstruction. A central right  upper lobe lung mass with direct mediastinal invasion is most apparent on soft tissue windows, including at 7.1 x 6.4 cm on 57/2. A combination of postobstructive and compressive atelectasis involves the majority of the remainder of the right lung. Extensive bilateral pulmonary nodules/metastasis. Index left upper lobe 1.0 cm nodule on 103/3. Index left lower lobe pulmonary nodule measures 1.0 cm on 111/3. Right lower lobe lung nodule of 11 mm on 110/3. Upper Abdomen: Motion degradation in the upper abdomen. Normal imaged portions of the liver, spleen, pancreas, gallbladder. Left greater than right, relatively mild adrenal thickening and nodularity. Proximal gastric underdistention. Upper pole 1.3 cm left renal cyst. Interpolar right renal too small to characterize lesion. Abdominal aortic atherosclerosis. Musculoskeletal: Multiple sclerotic foci, including within the sternal manubrium on sagittal image 97. Lytic lesion within the T2 vertebral body measures 11 mm on sagittal image 99. IMPRESSION: 1. 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 2. Metastatic disease to low cervical/thoracic nodes, lungs, right pleural space, and bones. 3. Moderate to large right pleural effusion. 4. SVC narrowing, suggesting impending SVC syndrome. Consider relatively urgent multidisciplinary thoracic oncology consultation. These results will be called to the ordering clinician or representative by the Radiologist Assistant, and communication documented in the PACS or [employer]. Electronically Signed   By: Abigail Miyamoto M.D.   On: 10/18/2020 16:42   MR BRAIN W WO CONTRAST  Result Date: 10/21/2020 CLINICAL DATA:  New diagnosis lung cancer. Rule out metastatic disease. Mental status change. EXAM: MRI HEAD WITHOUT AND WITH CONTRAST TECHNIQUE: Multiplanar, multiecho pulse sequences of the brain and surrounding structures were obtained without and with intravenous contrast. CONTRAST:   8mL GADAVIST GADOBUTROL 1 MMOL/ML IV SOLN COMPARISON:  None. FINDINGS: Brain: Negative for metastatic disease. No enhancing mass lesion in the brain. No cerebral edema identified. Negative for acute infarct. Scattered small white matter hyperintensities consistent with chronic microvascular ischemia. Negative for hemorrhage or mass lesion. Vascular: Normal arterial flow voids Skull and upper cervical spine: No focal skeletal lesion. Sinuses/Orbits: Paranasal sinuses clear.  Negative orbit Other: None IMPRESSION: Negative for metastatic disease Mild chronic microvascular ischemic change in the white matter. Negative for acute infarct. Electronically Signed   By: Franchot Gallo M.D.   On: 10/21/2020 18:56   NM PET Image  Initial (PI) Skull Base To Thigh  Result Date: 10/28/2020 CLINICAL DATA:  Initial treatment strategy for right upper lobe primary bronchogenic carcinoma. Malignant pleural effusion. EXAM: NUCLEAR MEDICINE PET SKULL BASE TO THIGH TECHNIQUE: 8.5 mCi F-18 FDG was injected intravenously. Full-ring PET imaging was performed from the skull base to thigh after the radiotracer. CT data was obtained and used for attenuation correction and anatomic localization. Fasting blood glucose: 91 mg/dl COMPARISON:  Chest CT 10/18/2020 FINDINGS: Mediastinal blood pool activity: SUV max 1.9 Liver activity: SUV max Not applicable. NECK: Hypermetabolic low right jugular/supraclavicular node measures 1.5 cm and a S.U.V. max of 7.0 on 59/3. CT findings: none CHEST: Central right upper lobe lung mass with direct mediastinal invasion. This measured 7.1 x 6.4 cm on prior diagnostic CT and a S.U.V. max of 9.6 on 86/3 today. Extensive right-sided pleural metastasis. The index implant on the prior CT corresponds to hypermetabolism, including at a S.U.V. max of 7.4 on 130/3 today. Left suprahilar node measures 1.9 cm and a S.U.V. max of 8.6 on 87/3. Left infrahilar adenopathy at 1.6 cm and a S.U.V. max of 8.1 on 107/3. Extensive  hypermetabolic pulmonary metastasis. Example in the left lower lobe at 1.7 cm and a S.U.V. max of 4.4 on 117/3. Hypermetabolism along the course of the right axillary vein, including at a S.U.V. max of 5.5 on 65/3. The vein is subtly hyperattenuating, and followed centrally to the level of SVC narrowing, including on 73/3. Incidental CT findings: Deferred to recent diagnostic CT. Moderate right pleural effusion is not significantly changed. Similar small pericardial effusion. ABDOMEN/PELVIS: A node adjacent to the right adrenal measures 5 mm and a S.U.V. max of 4.8 on 151/3. No abdominopelvic parenchymal hypermetabolism identified. Incidental CT findings: Abdominal aortic atherosclerosis. Bilateral adrenal thickening suggesting hyperplasia. Large colonic stool burden. SKELETON: Multifocal osseous metastasis. Index left posterior iliac lesion measures 8 mm and a S.U.V. max of 8.8 on 203/3. T2 lytic lesion is eccentric left at 11 mm and a S.U.V. max of 10.1 on 60/3. Incidental CT findings: none IMPRESSION: 1. Right upper lobe primary bronchogenic carcinoma with direct mediastinal invasion. 2. Metastatic disease to right pleural space, lungs, bones, nodes of the chest and less so lower neck/upper abdomen. 3. Hypermetabolism along the course of the right axillary vein with concurrent subtle hyperattenuation within the axillary vein and SVC. This continues to the level of SVC narrowing, suspicious for SVC occlusion and developing thrombus. Recommend clinical correlation for SVC syndrome. 4. Moderate right pleural effusion and small pericardial effusion, similar. Electronically Signed   By: Abigail Miyamoto M.D.   On: 10/28/2020 09:55   DG Chest Port 1 View  Result Date: 10/31/2020 CLINICAL DATA:  Right thoracentesis EXAM: PORTABLE CHEST 1 VIEW COMPARISON:  10/20/2020 FINDINGS: Right hilar mass with upper lobe collapse/replacement. Multiple pulmonary nodules. Small right pleural effusion in the frontal projection. No  evidence of pneumothorax. Stable heart size. IMPRESSION: Stable from 10/20/2020.  No complicating features of thoracentesis. Electronically Signed   By: Monte Fantasia M.D.   On: 10/31/2020 09:14   DG Chest Port 1 View  Result Date: 10/20/2020 CLINICAL DATA:  No known primary, now with concern for malignant right-sided pleural effusion post thoracentesis. EXAM: PORTABLE CHEST 1 VIEW COMPARISON:  Chest radiograph-earlier same day; chest CT-10/18/2020 FINDINGS: Interval reduction in persistent trace right-sided effusion post thoracentesis. No pneumothorax. Improved aeration of the right mid and lower lung with persistent right basilar consolidative opacities, likely atelectasis. Redemonstrated extensive bilateral nodular opacities with obscuration of the  right heart border secondary to persistent atelectasis/collapse of the right upper lobe. No left-sided pleural effusion or pneumothorax. No evidence of edema. No acute osseous abnormalities. IMPRESSION: 1. Interval reduction in persistent trace right-sided effusion post thoracentesis. No pneumothorax. 2. Improved aeration of the right mid and lower lung with persistent right basilar opacities, likely atelectasis. 3. Similar findings of right upper lobe collapse and extensive metastatic disease involving the lungs bilaterally as better demonstrated recent chest CT. Electronically Signed   By: Sandi Mariscal M.D.   On: 10/20/2020 12:17   DG Chest Port 1 View  Result Date: 10/20/2020 CLINICAL DATA:  Shortness of breath EXAM: PORTABLE CHEST 1 VIEW COMPARISON:  10/04/2020.  Chest CT from 2 days ago FINDINGS: Large right pleural effusion obscuring most of the right lung. Reticulonodular opacity on the left due to multiple pulmonary nodules. Normal heart size. Thoracic adenopathy by CT. IMPRESSION: Extensive intrathoracic malignancy by recent chest CT. Very large right pleural effusion. Electronically Signed   By: Monte Fantasia M.D.   On: 10/20/2020 04:41   [place] CORE  BIOPSY (LYMPH NODES)  Result Date: 10/31/2020 INDICATION: Lung carcinoma.  Hypermetabolic right supraclavicular adenopathy. EXAM: ULTRASOUND GUIDED CORE BIOPSY OF RIGHT SUPRACLAVICULAR ADENOPATHY MEDICATIONS: No antibiotics indicated ANESTHESIA/SEDATION: Lidocaine 1% subcutaneous PROCEDURE: The procedure, risks, benefits, and alternatives were explained to the patient. Questions regarding the procedure were encouraged and answered. The patient understands and consents to the procedure. Survey ultrasound of the right supraclavicular region was performed. The adenopathy was localized and an appropriate skin entry site was determined and marked. Thrombosis of the right IJ vein incidentally noted. The operative field was prepped with chlorhexidine in a sterile fashion, and a sterile drape was applied covering the operative field. A sterile gown and sterile gloves were used for the procedure. Local anesthesia was provided with 1% Lidocaine. Under real-time ultrasound guidance, multiple 18-gauge core biopsy samples were obtained, submitted in saline on Telfa to surgical pathology. The guide needle was removed. Postprocedure scans show no hemorrhage or other apparent complication. The patient tolerated the procedure well. COMPLICATIONS: None immediate. FINDINGS: Right supraclavicular adenopathy was localized. Representative core biopsy samples obtained as above. Thrombosis of the right IJ vein incidentally noted. IMPRESSION: 1. Technically successful ultrasound-guided core biopsy, right supraclavicular adenopathy. 2. Right IJ venous thrombosis incidentally noted. Electronically Signed   By: Lucrezia Europe M.D.   On: 10/31/2020 11:01   US THORACENTESIS ASP PLEURAL SPACE W/IMG GUIDE  Result Date: 10/31/2020 INDICATION: Right lung carcinoma. Pleural metastases and recurrent symptomatic effusion EXAM: ULTRASOUND GUIDED RIGHT THORACENTESIS MEDICATIONS: Lidocaine 1% subcutaneous COMPLICATIONS: None immediate. Postprocedure  chest radiograph shows no pneumothorax. PROCEDURE: An ultrasound guided thoracentesis was thoroughly discussed with the patient and questions answered. The benefits, risks, alternatives and complications were also discussed. The patient understands and wishes to proceed with the procedure. Written consent was obtained. Ultrasound was performed to localize and mark an adequate pocket of fluid in the right chest. The area was then prepped and draped in the normal sterile fashion. 1% Lidocaine was used for local anesthesia. Under ultrasound guidance a 6 Fr Safe-T-Centesis catheter was introduced. Thoracentesis was performed. The catheter was removed and a dressing applied. FINDINGS: A total of approximately 2.3 L of red tinged fluid was removed. Samples were sent to the laboratory as requested by the clinical team. IMPRESSION: Successful ultrasound guided right thoracentesis yielding 2.3 L of pleural fluid. Electronically Signed   By: Lucrezia Europe M.D.   On: 10/31/2020 11:02   US THORACENTESIS ASP PLEURAL  SPACE W/IMG GUIDE  Result Date: 10/20/2020 INDICATION: No known primary, now with concern for malignant right-sided pleural effusion. Please perform ultrasound-guided thoracentesis for diagnostic and therapeutic purposes. EXAM: US THORACENTESIS ASP PLEURAL SPACE W/IMG GUIDE COMPARISON:  Chest CT-10/18/2020 MEDICATIONS: None. COMPLICATIONS: None immediate. TECHNIQUE: Informed written consent was obtained from the patient after a discussion of the risks, benefits and alternatives to treatment. A timeout was performed prior to the initiation of the procedure. Initial ultrasound scanning demonstrates a large complex right-sided pleural effusion with large amount floating echogenic debris within the pleural fluid but without definitive loculations. The lower chest was prepped and draped in the usual sterile fashion. 1% lidocaine was used for local anesthesia. An ultrasound image was saved for documentation purposes. An 8  Fr Safe-T-Centesis catheter was introduced. The thoracentesis was performed. The catheter was removed and a dressing was applied. The patient tolerated the procedure well without immediate post procedural complication. The patient was escorted to have an upright chest radiograph. FINDINGS: A total of approximately 2.4 liters of serous fluid was removed. Requested samples were sent to the laboratory. IMPRESSION: Successful ultrasound-guided right sided thoracentesis yielding 2.4 liters of pleural fluid. Electronically Signed   By: Sandi Mariscal M.D.   On: 10/20/2020 12:15     Assessment and plan- Patient is a 59 y.o. female who presents to [facility] for initial meeting in preparation for starting chemotherapy for the treatment of lung cancer.   1. HPI: Mrs. Graffeo is a 59 year old female with past medical history significant for GERD COPD, tobacco abuse who was recently diagnosed with lung cancer and pleural effusion.  She is brought to [facility] emergency room on 10/20/2020 by EMS secondary to generalized weakness, chest pain and shortness of breath.  She was recently diagnosed with COPD 1 month prior.  She also admitted to unintentional weight loss during the past few months.  Work-up included imaging which showed right-sided pleural effusion status post thoracentesis (2.4 L of fluid removed) and right upper lobe mass and SVC narrowing.  She had a brain MRI on 10/21/2020 which was negative.  Had PET scan on 10/27/2020 which showed right upper lobe primary bronchogenic mass.  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  Plan is to begin chemo with carbo, Alimta and Bevacizumab along with concurrent radiation.  2. Chemo Care Clinic/High Risk for ER/Hospitalization during chemotherapy- We discussed the role of the chemo care clinic and identified patient specific risk factors. I discussed that patient was identified as high risk primarily based on: Stage of disease.  Patient  has past medical history positive for: Past Medical History:  Diagnosis Date  . COPD (chronic obstructive pulmonary disease) ([place])   . GERD (gastroesophageal reflux disease)   . Metastatic lung cancer (metastasis from lung to other site) [facility]) 11/02/2020    Patient has past surgical history positive for: Past Surgical History:  Procedure Laterality Date  . COLONOSCOPY WITH ESOPHAGOGASTRODUODENOSCOPY (EGD)    . COLONOSCOPY WITH PROPOFOL N/A 11/14/2015   Procedure: COLONOSCOPY WITH PROPOFOL;  Surgeon: Hulen Luster, MD;  Location: [facility] ENDOSCOPY;  Service: Gastroenterology;  Laterality: N/A;  . ESOPHAGOGASTRODUODENOSCOPY (EGD) WITH PROPOFOL N/A 11/14/2015   Procedure: ESOPHAGOGASTRODUODENOSCOPY (EGD) WITH PROPOFOL;  Surgeon: Hulen Luster, MD;  Location: [facility] ENDOSCOPY;  Service: Gastroenterology;  Laterality: N/A;    Based on our high risk symptom management report; this patient has a high risk of ED utilization.  The percentage below indicates how "at risk "  this patient  based on the factors in this table within one year.   General Risk Score: 4  Values used to calculate this score:   Points  Metrics      0        Age: 11      2        Hospital Admissions: 2      1        ED Visits: 1      1        Has Chronic Obstructive Pulmonary Disease: Yes      0        Has Diabetes: No      0        Has Congestive Heart Failure: No      0        Has liver disease: No      0        Has Depression: No      0        Current PCP: Brittney Plumber, PA      0        Has Medicaid: No  3. We discussed that social determinants of health may have significant impacts on health and outcomes for cancer patients.  Today we discussed specific social determinants of performance status, alcohol use, depression, financial needs, food insecurity, housing, interpersonal violence, social connections, stress, tobacco use, and transportation.    After lengthy discussion the following were identified as areas of need:    BLE swelling-patient noticed swelling this morning.  Denies any pain.  She will keep an eye on her legs and ankles and see if they improve tomorrow morning.  Recommend she keep them elevated during the day or while sitting.  Financial support-patient will speak with her niece about possible financial opportunities.  She does not know of anything right now that we can help with.  I did give her Barnabas Lister craters telephone number and email so she can reach out to him directly.  Outpatient services: We discussed options including home based and outpatient services, DME and care program. We discusssed that patients who participate in regular physical activity report fewer negative impacts of cancer and treatments and report less fatigue.   Financial Concerns: We discussed that living with cancer can create tremendous financial burden.  We discussed options for assistance. I asked that if assistance is needed in affording medications or paying bills to please let us know so that we can provide assistance. We discussed options for food including social services, [employer] ($50 every 2 weeks) and onsite food pantry.  We will also notify Barnabas Lister crater to see if cancer center can provide additional support.  Referral to Social work: Introduced Education officer, museum Elease Etienne and the services he can provide such as support with [employer], cell phone and gas vouchers.   Support groups: We discussed options for support groups at the cancer center. If interested, please notify nurse navigator to enroll. We discussed options for managing stress including healthy eating, exercise as well as participating in no charge counseling services at the cancer center and support groups.  If these are of interest, patient can notify either myself or primary nursing team.We discussed options for management including medications and referral to quit Smart program  Transportation: We discussed options for transportation  including acta, paratransit, bus routes, link transit, taxi/uber/lyft, and cancer center [place].  I have notified primary oncology team who will help assist with arranging Lucianne Lei transportation for  appointments when/if needed. We also discussed options for transportation on short notice/acute visits.  Palliative care services: We have palliative care services available in the cancer center to discuss goals of care and advanced care planning.  Please let us know if you have any questions or would like to speak to our palliative nurse practitioner.  Symptom Management Clinic: We discussed our symptom management clinic which is available for acute concerns while receiving treatment such as nausea, vomiting or diarrhea.  We can be reached via telephone at [phone] or through my chart.  We are available for virtual or in person visits on the same day from 830 to 4 PM Monday through Friday. She denies needing specific assistance at this time and She will be followed by Dr. Collie Siad clinical team.  Plan: Discussed symptom management clinic. Discussed palliative care services. Discussed resources that are available here at the cancer center. Discussed medications and new prescriptions to begin treatment such as anti-nausea or steroids.   Disposition: RTC on 11/08/2020 at 130pm for radiation consultation. RTC on 11/15/2020 for possible thoracentesis. RTC on 11/18/2020 for lab work, MD assessment and cycle 1 of chemotherapy.  Visit Diagnosis 1. Primary malignant neoplasm of lung metastatic to other site, unspecified laterality [facility])     Patient expressed understanding and was in agreement with this plan. She also understands that She can call clinic at any time with any questions, concerns, or complaints.   I provided 15 minutes of non face-to-face telephone visit time during this encounter, and > 50% was spent counseling as documented under my assessment & plan.   [place] [facility] [place]  CC: Dr. [name]

## 2020-11-09 ENCOUNTER — Inpatient Hospital Stay (HOSPITAL_BASED_OUTPATIENT_CLINIC_OR_DEPARTMENT_OTHER): Payer: BC Managed Care – PPO | Admitting: Oncology

## 2020-11-09 ENCOUNTER — Ambulatory Visit
Admission: RE | Admit: 2020-11-09 | Discharge: 2020-11-09 | Disposition: A | Discharge status: Home / Self Care | Payer: BC Managed Care – PPO | Source: Ambulatory Visit | Attending: Radiation Oncology | Admitting: Radiation Oncology

## 2020-11-09 VITALS — BP 104/56 | HR 98 | Temp 97.5°F | Resp 18 | Wt 147.8 lb

## 2020-11-09 DIAGNOSIS — C3490 Malignant neoplasm of unspecified part of unspecified bronchus or lung: Secondary | ICD-10-CM

## 2020-11-09 DIAGNOSIS — Z510 Encounter for antineoplastic radiation therapy: Secondary | ICD-10-CM | POA: Insufficient documentation

## 2020-11-09 DIAGNOSIS — M7989 Other specified soft tissue disorders: Secondary | ICD-10-CM | POA: Diagnosis not present

## 2020-11-09 NOTE — Progress Notes (Signed)
Patient noticed bilateral LE edema L>R for the past 2 days.

## 2020-11-09 NOTE — Progress Notes (Signed)
Symptom Management Consult note [facility]  Telephone:([phone] [phone]  Patient Care Team: Ranae Plumber, [place] as PCP - General (Family Medicine) Telford Nab, RN as Oncology Nurse Navigator   Name of the patient: Brittney Fisher  [id number]  Oct 23, 1961   Date of visit: 11/09/2020   Diagnosis-lung cancer  Chief complaint/ Reason for visit-lower extremity swelling  Heme/Onc history:  Oncology History  Metastatic lung cancer (metastasis from lung to other site) [facility])  11/02/2020 Initial Diagnosis   Metastatic lung cancer (metastasis from lung to other site) [facility])   11/08/2020 -  Chemotherapy   The patient had dexamethasone (DECADRON) 4 MG tablet, 1 of 1 cycle, Start date: 11/02/2020, End date: -- palonosetron (ALOXI) injection 0.25 mg, 0.25 mg, Intravenous,  Once, 0 of 5 cycles PEMEtrexed (ALIMTA) 900 mg in sodium chloride 0.9 % 100 mL chemo infusion, 500 mg/m2, Intravenous,  Once, 0 of 5 cycles CARBOplatin (PARAPLATIN) in sodium chloride 0.9 % 100 mL chemo infusion, , Intravenous,  Once, 0 of 5 cycles fosaprepitant (EMEND) 150 mg in sodium chloride 0.9 % 145 mL IVPB, 150 mg, Intravenous,  Once, 0 of 5 cycles bevacizumab-bvzr (ZIRABEV) 1,000 mg in sodium chloride 0.9 % 100 mL chemo infusion, 15 mg/kg, Intravenous,  Once, 0 of 5 cycles  for chemotherapy treatment.    Primary malignant neoplasm of lung metastatic to other site [facility])  11/04/2020 Initial Diagnosis   Primary malignant neoplasm of lung metastatic to other site [facility])   11/04/2020 Cancer Staging   Staging form: Lung, AJCC 8th Edition - Clinical stage from 11/04/2020: Stage IV (cT4, cN3, cM1) - Signed by [name], MD on 11/04/2020    Interval history-[name] is a 59 year old female with past medical history significant for GERD COPD, tobacco abuse who was recently diagnosed with lung cancer and pleural effusion.  She is brought to [facility] emergency room on 10/20/2020 by EMS  secondary to generalized weakness, chest pain and shortness of breath.  She was recently diagnosed with COPD 1 month prior.  She also admitted to unintentional weight loss during the past few months.  Work-up included imaging which showed right-sided pleural effusion status post thoracentesis (2.4 L of fluid removed) and right upper lobe mass and SVC narrowing.  She had a brain MRI on 10/21/2020 which was negative.  Had PET scan on 10/27/2020 which showed right upper lobe primary bronchogenic mass.  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  Plan is to begin chemo with carbo, Alimta and Bevacizumab along with concurrent radiation.  She met with Dr. 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  Today, patient presents with her daughter.  Patient states she noticed swelling of her lower extremities on Monday afternoon.  She did receive a B12 injection that day.  Denies any additional medications.  She is relatively inactive.  Denies history of heart failure.  Denies any increased shortness of breath or palpitations.  She denies any pain in either leg.  Notes left leg is more swollen than right leg.  She has not tried elevation or compression stockings.  She is not on anticoagulation.  She denies any additional symptoms including neurological complaints, recent fever or illness, easy bleeding or bruising.  Appetite is fair and she denies any unintentional weight loss.  She denies any chest pain, nausea, vomiting, constipation or diarrhea.  ECOG FS:1 - Symptomatic but  completely ambulatory  Review of systems- Review of Systems  Cardiovascular: Positive for leg swelling (L>R).    Current treatment-scheduled to begin concurrent chemo radiation next week  No Known Allergies   Past Medical History:  Diagnosis Date  . COPD (chronic obstructive pulmonary disease) ([place])   . GERD  (gastroesophageal reflux disease)   . Metastatic lung cancer (metastasis from lung to other site) [facility]) 11/02/2020     Past Surgical History:  Procedure Laterality Date  . COLONOSCOPY WITH ESOPHAGOGASTRODUODENOSCOPY (EGD)    . COLONOSCOPY WITH PROPOFOL N/A 11/14/2015   Procedure: COLONOSCOPY WITH PROPOFOL;  Surgeon: Hulen Luster, MD;  Location: [facility] ENDOSCOPY;  Service: Gastroenterology;  Laterality: N/A;  . ESOPHAGOGASTRODUODENOSCOPY (EGD) WITH PROPOFOL N/A 11/14/2015   Procedure: ESOPHAGOGASTRODUODENOSCOPY (EGD) WITH PROPOFOL;  Surgeon: Hulen Luster, MD;  Location: [facility] ENDOSCOPY;  Service: Gastroenterology;  Laterality: N/A;    Social History   Socioeconomic History  . Marital status: Married    Spouse name: Not on file  . Number of children: Not on file  . Years of education: Not on file  . Highest education level: Not on file  Occupational History  . Not on file  Tobacco Use  . Smoking status: Current Every Day Smoker    Packs/day: 0.20    Types: Cigarettes  . Smokeless tobacco: Never Used  Substance and Sexual Activity  . Alcohol use: Never  . Drug use: Never  . Sexual activity: Not Currently  Other Topics Concern  . Not on file  Social History Narrative  . Not on file   Social Determinants of Health   Financial Resource Strain:   . Difficulty of Paying Living Expenses: Not on file  Food Insecurity:   . Worried About Charity fundraiser in the Last Year: Not on file  . Ran Out of Food in the Last Year: Not on file  Transportation Needs:   . Lack of Transportation (Medical): Not on file  . Lack of Transportation (Non-Medical): Not on file  Physical Activity:   . Days of Exercise per Week: Not on file  . Minutes of Exercise per Session: Not on file  Stress:   . Feeling of Stress : Not on file  Social Connections:   . Frequency of Communication with Friends and Family: Not on file  . Frequency of Social Gatherings with Friends and Family: Not on file  . Attends  Religious Services: Not on file  . Active Member of Clubs or Organizations: Not on file  . Attends Archivist Meetings: Not on file  . Marital Status: Not on file  Intimate Partner Violence:   . Fear of Current or Ex-Partner: Not on file  . Emotionally Abused: Not on file  . Physically Abused: Not on file  . Sexually Abused: Not on file    Family History  Problem Relation Age of Onset  . Breast cancer Cousin 85  . Diabetes type II Mother   . Hypertension Mother   . Colon cancer Father   . Hypertension Father      Current Outpatient Medications:  .  benzonatate (TESSALON) 100 MG capsule, Take 1 capsule (100 mg total) by mouth 3 (three) times daily as needed for cough., Disp: 60 capsule, Rfl: 1 .  feeding supplement (ENSURE ENLIVE / ENSURE PLUS) LIQD, Take 237 mLs by mouth 2 (two) times daily between meals., Disp: 237 mL, Rfl: 12 .  folic acid (FOLVITE) 1 MG tablet, Take 1 tablet (1 mg total) by  mouth daily. Start 5-7 days before Alimta chemotherapy. Continue until 21 days after Alimta completed., Disp: 100 tablet, Rfl: 3 .  guaiFENesin (MUCINEX) 600 MG 12 hr tablet, Take 1 tablet (600 mg total) by mouth 2 (two) times daily., Disp: 14 tablet, Rfl: 0 .  lidocaine-prilocaine (EMLA) cream, Apply to affected area once, Disp: 30 g, Rfl: 3 .  omeprazole (PRILOSEC) 20 MG capsule, Take 20 mg by mouth daily., Disp: , Rfl:  .  oxyCODONE (OXY IR/ROXICODONE) 5 MG immediate release tablet, Take 1 tablet (5 mg total) by mouth every 6 (six) hours as needed for severe pain., Disp: 60 tablet, Rfl: 0 .  tiotropium (SPIRIVA HANDIHALER) 18 MCG inhalation capsule, Place 18 mcg into inhaler and inhale daily., Disp: , Rfl:  .  acetaminophen (TYLENOL) 500 MG tablet, Take 1,000 mg by mouth every 8 (eight) hours as needed for moderate pain. (Patient not taking: Reported on 11/04/2020), Disp: , Rfl:  .  dexamethasone (DECADRON) 4 MG tablet, Take 1 tab (4 mg) twice a day the day before Alimta chemo. Take  2 tablets (8 mg) po daily x 3 days starting the day after chemo. (Patient not taking: Reported on 11/04/2020), Disp: 30 tablet, Rfl: 1 .  ipratropium-albuterol (DUONEB) 0.5-2.5 (3) MG/3ML SOLN, Take 3 mLs by nebulization 3 (three) times daily., Disp: 360 mL, Rfl: 1 .  ondansetron (ZOFRAN) 8 MG tablet, Take 1 tablet (8 mg total) by mouth 2 (two) times daily as needed for refractory nausea / vomiting. Start on day 3 after chemo. (Patient not taking: Reported on 11/04/2020), Disp: 30 tablet, Rfl: 1 .  prochlorperazine (COMPAZINE) 10 MG tablet, Take 1 tablet (10 mg total) by mouth every 6 (six) hours as needed (Nausea or vomiting). (Patient not taking: Reported on 11/04/2020), Disp: 30 tablet, Rfl: 1  Physical exam:  Vitals:   11/09/20 0933  BP: (!) 104/56  Pulse: 98  Resp: 18  Temp: (!) 97.5 F (36.4 C)  Weight: 147 lb 12.8 oz (67 kg)   Physical Exam Constitutional:      Appearance: Normal appearance.  HENT:     Head: Normocephalic and atraumatic.  Eyes:     Pupils: Pupils are equal, round, and reactive to light.  Cardiovascular:     Rate and Rhythm: Normal rate and regular rhythm.     Heart sounds: Normal heart sounds. No murmur heard.   Pulmonary:     Effort: Pulmonary effort is normal.     Breath sounds: Normal breath sounds. No wheezing.  Abdominal:     General: Bowel sounds are normal. There is no distension.     Palpations: Abdomen is soft.     Tenderness: There is no abdominal tenderness.  Musculoskeletal:        General: Swelling (L > R) present. Normal range of motion.     Cervical back: Normal range of motion.  Skin:    General: Skin is warm and dry.     Findings: No rash.  Neurological:     Mental Status: She is alert and oriented to person, place, and time.  Psychiatric:        Judgment: Judgment normal.      CMP Latest Ref Rng & Units 10/21/2020  Glucose 70 - 99 mg/dL 101(H)  BUN 8 - 23 mg/dL 13  Creatinine 0.44 - 1.00 mg/dL 0.76  Sodium 135 - 145 mmol/L  134(L)  Potassium 3.5 - 5.1 mmol/L 3.9  Chloride 98 - 111 mmol/L 102  CO2 22 - 32  mmol/L 23  Calcium 8.9 - 10.3 mg/dL 8.5(L)  Total Protein 6.5 - 8.1 g/dL 6.4(L)  Total Bilirubin 0.3 - 1.2 mg/dL 0.6  Alkaline Phos 38 - 126 U/L 62  AST 15 - 41 U/L 16  ALT 0 - 44 U/L 17   CBC Latest Ref Rng & Units 10/21/2020  WBC 4.0 - 10.5 K/uL 14.0(H)  Hemoglobin 12.0 - 15.0 g/dL 11.9(L)  Hematocrit 36 - 46 % 35.3(L)  Platelets 150 - 400 K/uL 242    No images are attached to the encounter.  CT CHEST W CONTRAST  Result Date: 10/18/2020 CLINICAL DATA:  Malignant pleural effusion. Recently diagnosed with COPD. Abnormal chest radiograph. EXAM: CT CHEST WITH CONTRAST TECHNIQUE: Multidetector CT imaging of the chest was performed during intravenous contrast administration. CONTRAST:  48m ISOVUE-300 IOPAMIDOL (ISOVUE-300) INJECTION 61% COMPARISON:  10/05/2020 chest radiograph. FINDINGS: Cardiovascular: Advanced aortic and branch vessel atherosclerosis. Tortuous thoracic aorta. Normal heart size with minimal pericardial fluid. Left upper lobe pulmonary vein is surrounded and narrowed by adenopathy on 70/2. Pulmonary artery branches are also narrowed by adenopathy and the central right upper lobe lung mass. No pulmonary artery filling defect. SVC narrowing including on 60/2 and coronal image 62. Mediastinum/Nodes: Right supraclavicular/low jugular 1.5 cm node on 09/02. Massive thoracic adenopathy. Pretracheal mass/adenopathy measures 3.7 cm on 55/2. Subcarinal adenopathy measures 2.1 cm on 75/2. Extensive left hilar and infrahilar adenopathy, including a nodal mass of 1.6 x 3.7 cm on 70/2. Prevascular adenopathy at 1.7 cm on 64/2. Juxta diaphragmatic left-sided nodes or nodules including at 9 mm on 147/2. Lungs/Pleura: Moderate to large right pleural effusion. Extensive irregular soft tissue thickening within the right pleural space, including on 83/2, consistent with pleural metastasis. Anterior right pleural implant  measures 5.9 cm maximally on 122/2. Mild tracheal deviation to the right. Right upper lobe bronchial obstruction on 62/3. Right middle lobe endobronchial obstruction 80/3. Significant mass-effect upon right lower lobe bronchi with near complete obstruction. A central right upper lobe lung mass with direct mediastinal invasion is most apparent on soft tissue windows, including at 7.1 x 6.4 cm on 57/2. A combination of postobstructive and compressive atelectasis involves the majority of the remainder of the right lung. Extensive bilateral pulmonary nodules/metastasis. Index left upper lobe 1.0 cm nodule on 103/3. Index left lower lobe pulmonary nodule measures 1.0 cm on 111/3. Right lower lobe lung nodule of 11 mm on 110/3. Upper Abdomen: Motion degradation in the upper abdomen. Normal imaged portions of the liver, spleen, pancreas, gallbladder. Left greater than right, relatively mild adrenal thickening and nodularity. Proximal gastric underdistention. Upper pole 1.3 cm left renal cyst. Interpolar right renal too small to characterize lesion. Abdominal aortic atherosclerosis. Musculoskeletal: Multiple sclerotic foci, including within the sternal manubrium on sagittal image 97. Lytic lesion within the T2 vertebral body measures 11 mm on sagittal image 99. IMPRESSION: 1. 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 2. Metastatic disease to low cervical/thoracic nodes, lungs, right pleural space, and bones. 3. Moderate to large right pleural effusion. 4. SVC narrowing, suggesting impending SVC syndrome. Consider relatively urgent multidisciplinary thoracic oncology consultation. These results will be called to the ordering clinician or representative by the Radiologist Assistant, and communication documented in the PACS or [employer] Electronically Signed   By: KAbigail MiyamotoM.D.   On: 10/18/2020 16:42   MR BRAIN W WO CONTRAST  Result Date: 10/21/2020 CLINICAL DATA:   New diagnosis lung cancer. Rule out metastatic disease. Mental status change. EXAM: MRI  HEAD WITHOUT AND WITH CONTRAST TECHNIQUE: Multiplanar, multiecho pulse sequences of the brain and surrounding structures were obtained without and with intravenous contrast. CONTRAST:  25m GADAVIST GADOBUTROL 1 MMOL/ML IV SOLN COMPARISON:  None. FINDINGS: Brain: Negative for metastatic disease. No enhancing mass lesion in the brain. No cerebral edema identified. Negative for acute infarct. Scattered small white matter hyperintensities consistent with chronic microvascular ischemia. Negative for hemorrhage or mass lesion. Vascular: Normal arterial flow voids Skull and upper cervical spine: No focal skeletal lesion. Sinuses/Orbits: Paranasal sinuses clear.  Negative orbit Other: None IMPRESSION: Negative for metastatic disease Mild chronic microvascular ischemic change in the white matter. Negative for acute infarct. Electronically Signed   By: CFranchot GalloM.D.   On: 10/21/2020 18:56   NM PET Image Initial (PI) Skull Base To Thigh  Result Date: 10/28/2020 CLINICAL DATA:  Initial treatment strategy for right upper lobe primary bronchogenic carcinoma. Malignant pleural effusion. EXAM: NUCLEAR MEDICINE PET SKULL BASE TO THIGH TECHNIQUE: 8.5 mCi F-18 FDG was injected intravenously. Full-ring PET imaging was performed from the skull base to thigh after the radiotracer. CT data was obtained and used for attenuation correction and anatomic localization. Fasting blood glucose: 91 mg/dl COMPARISON:  Chest CT 10/18/2020 FINDINGS: Mediastinal blood pool activity: SUV max 1.9 Liver activity: SUV max Not applicable. NECK: Hypermetabolic low right jugular/supraclavicular node measures 1.5 cm and a S.U.V. max of 7.0 on 59/3. CT findings: none CHEST: Central right upper lobe lung mass with direct mediastinal invasion. This measured 7.1 x 6.4 cm on prior diagnostic CT and a S.U.V. max of 9.6 on 86/3 today. Extensive right-sided pleural  metastasis. The index implant on the prior CT corresponds to hypermetabolism, including at a S.U.V. max of 7.4 on 130/3 today. Left suprahilar node measures 1.9 cm and a S.U.V. max of 8.6 on 87/3. Left infrahilar adenopathy at 1.6 cm and a S.U.V. max of 8.1 on 107/3. Extensive hypermetabolic pulmonary metastasis. Example in the left lower lobe at 1.7 cm and a S.U.V. max of 4.4 on 117/3. Hypermetabolism along the course of the right axillary vein, including at a S.U.V. max of 5.5 on 65/3. The vein is subtly hyperattenuating, and followed centrally to the level of SVC narrowing, including on 73/3. Incidental CT findings: Deferred to recent diagnostic CT. Moderate right pleural effusion is not significantly changed. Similar small pericardial effusion. ABDOMEN/PELVIS: A node adjacent to the right adrenal measures 5 mm and a S.U.V. max of 4.8 on 151/3. No abdominopelvic parenchymal hypermetabolism identified. Incidental CT findings: Abdominal aortic atherosclerosis. Bilateral adrenal thickening suggesting hyperplasia. Large colonic stool burden. SKELETON: Multifocal osseous metastasis. Index left posterior iliac lesion measures 8 mm and a S.U.V. max of 8.8 on 203/3. T2 lytic lesion is eccentric left at 11 mm and a S.U.V. max of 10.1 on 60/3. Incidental CT findings: none IMPRESSION: 1. Right upper lobe primary bronchogenic carcinoma with direct mediastinal invasion. 2. Metastatic disease to right pleural space, lungs, bones, nodes of the chest and less so lower neck/upper abdomen. 3. Hypermetabolism along the course of the right axillary vein with concurrent subtle hyperattenuation within the axillary vein and SVC. This continues to the level of SVC narrowing, suspicious for SVC occlusion and developing thrombus. Recommend clinical correlation for SVC syndrome. 4. Moderate right pleural effusion and small pericardial effusion, similar. Electronically Signed   By: KAbigail MiyamotoM.D.   On: 10/28/2020 09:55   DG Chest Port  1 View  Result Date: 10/31/2020 CLINICAL DATA:  Right thoracentesis EXAM: PORTABLE CHEST 1  VIEW COMPARISON:  10/20/2020 FINDINGS: Right hilar mass with upper lobe collapse/replacement. Multiple pulmonary nodules. Small right pleural effusion in the frontal projection. No evidence of pneumothorax. Stable heart size. IMPRESSION: Stable from 10/20/2020.  No complicating features of thoracentesis. Electronically Signed   By: Monte Fantasia M.D.   On: 10/31/2020 09:14   DG Chest Port 1 View  Result Date: 10/20/2020 CLINICAL DATA:  No known primary, now with concern for malignant right-sided pleural effusion post thoracentesis. EXAM: PORTABLE CHEST 1 VIEW COMPARISON:  Chest radiograph-earlier same day; chest CT-10/18/2020 FINDINGS: Interval reduction in persistent trace right-sided effusion post thoracentesis. No pneumothorax. Improved aeration of the right mid and lower lung with persistent right basilar consolidative opacities, likely atelectasis. Redemonstrated extensive bilateral nodular opacities with obscuration of the right heart border secondary to persistent atelectasis/collapse of the right upper lobe. No left-sided pleural effusion or pneumothorax. No evidence of edema. No acute osseous abnormalities. IMPRESSION: 1. Interval reduction in persistent trace right-sided effusion post thoracentesis. No pneumothorax. 2. Improved aeration of the right mid and lower lung with persistent right basilar opacities, likely atelectasis. 3. Similar findings of right upper lobe collapse and extensive metastatic disease involving the lungs bilaterally as better demonstrated recent chest CT. Electronically Signed   By: Sandi Mariscal M.D.   On: 10/20/2020 12:17   DG Chest Port 1 View  Result Date: 10/20/2020 CLINICAL DATA:  Shortness of breath EXAM: PORTABLE CHEST 1 VIEW COMPARISON:  10/04/2020.  Chest CT from 2 days ago FINDINGS: Large right pleural effusion obscuring most of the right lung. Reticulonodular opacity on  the left due to multiple pulmonary nodules. Normal heart size. Thoracic adenopathy by CT. IMPRESSION: Extensive intrathoracic malignancy by recent chest CT. Very large right pleural effusion. Electronically Signed   By: Monte Fantasia M.D.   On: 10/20/2020 04:41   [place] CORE BIOPSY (LYMPH NODES)  Result Date: 10/31/2020 INDICATION: Lung carcinoma.  Hypermetabolic right supraclavicular adenopathy. EXAM: ULTRASOUND GUIDED CORE BIOPSY OF RIGHT SUPRACLAVICULAR ADENOPATHY MEDICATIONS: No antibiotics indicated ANESTHESIA/SEDATION: Lidocaine 1% subcutaneous PROCEDURE: The procedure, risks, benefits, and alternatives were explained to the patient. Questions regarding the procedure were encouraged and answered. The patient understands and consents to the procedure. Survey ultrasound of the right supraclavicular region was performed. The adenopathy was localized and an appropriate skin entry site was determined and marked. Thrombosis of the right IJ vein incidentally noted. The operative field was prepped with chlorhexidine in a sterile fashion, and a sterile drape was applied covering the operative field. A sterile gown and sterile gloves were used for the procedure. Local anesthesia was provided with 1% Lidocaine. Under real-time ultrasound guidance, multiple 18-gauge core biopsy samples were obtained, submitted in saline on Telfa to surgical pathology. The guide needle was removed. Postprocedure scans show no hemorrhage or other apparent complication. The patient tolerated the procedure well. COMPLICATIONS: None immediate. FINDINGS: Right supraclavicular adenopathy was localized. Representative core biopsy samples obtained as above. Thrombosis of the right IJ vein incidentally noted. IMPRESSION: 1. Technically successful ultrasound-guided core biopsy, right supraclavicular adenopathy. 2. Right IJ venous thrombosis incidentally noted. Electronically Signed   By: Lucrezia Europe M.D.   On: 10/31/2020 11:01   US THORACENTESIS  ASP PLEURAL SPACE W/IMG GUIDE  Result Date: 10/31/2020 INDICATION: Right lung carcinoma. Pleural metastases and recurrent symptomatic effusion EXAM: ULTRASOUND GUIDED RIGHT THORACENTESIS MEDICATIONS: Lidocaine 1% subcutaneous COMPLICATIONS: None immediate. Postprocedure chest radiograph shows no pneumothorax. PROCEDURE: An ultrasound guided thoracentesis was thoroughly discussed with the patient and questions answered. The benefits, risks, alternatives  and complications were also discussed. The patient understands and wishes to proceed with the procedure. Written consent was obtained. Ultrasound was performed to localize and mark an adequate pocket of fluid in the right chest. The area was then prepped and draped in the normal sterile fashion. 1% Lidocaine was used for local anesthesia. Under ultrasound guidance a 6 Fr Safe-T-Centesis catheter was introduced. Thoracentesis was performed. The catheter was removed and a dressing applied. FINDINGS: A total of approximately 2.3 L of red tinged fluid was removed. Samples were sent to the laboratory as requested by the clinical team. IMPRESSION: Successful ultrasound guided right thoracentesis yielding 2.3 L of pleural fluid. Electronically Signed   By: Lucrezia Europe M.D.   On: 10/31/2020 11:02   US THORACENTESIS ASP PLEURAL SPACE W/IMG GUIDE  Result Date: 10/20/2020 INDICATION: No known primary, now with concern for malignant right-sided pleural effusion. Please perform ultrasound-guided thoracentesis for diagnostic and therapeutic purposes. EXAM: US THORACENTESIS ASP PLEURAL SPACE W/IMG GUIDE COMPARISON:  Chest CT-10/18/2020 MEDICATIONS: None. COMPLICATIONS: None immediate. TECHNIQUE: Informed written consent was obtained from the patient after a discussion of the risks, benefits and alternatives to treatment. A timeout was performed prior to the initiation of the procedure. Initial ultrasound scanning demonstrates a large complex right-sided pleural effusion with  large amount floating echogenic debris within the pleural fluid but without definitive loculations. The lower chest was prepped and draped in the usual sterile fashion. 1% lidocaine was used for local anesthesia. An ultrasound image was saved for documentation purposes. An 8 Fr Safe-T-Centesis catheter was introduced. The thoracentesis was performed. The catheter was removed and a dressing was applied. The patient tolerated the procedure well without immediate post procedural complication. The patient was escorted to have an upright chest radiograph. FINDINGS: A total of approximately 2.4 liters of serous fluid was removed. Requested samples were sent to the laboratory. IMPRESSION: Successful ultrasound-guided right sided thoracentesis yielding 2.4 liters of pleural fluid. Electronically Signed   By: Sandi Mariscal M.D.   On: 10/20/2020 12:15   Assessment and plan- Patient is a 59 y.o. female with recent diagnosis of lung cancer and SVC syndrome who presents to [facility] for concerns of bilateral lower extremity swelling.  She is scheduled to begin concurrent chemoradiation next week.  Bilateral lower extremity- swelling-left> right.  Denies any prior history of lower extremity swelling.  Noted on Monday after her B12 injection.  No changes in medications.  No history of congestive heart failure.  No history of blood clots.  Not on anticoagulation.  We did discuss that the presence of cancer does increase her risk for blood clots.  She denies any pain in either one of her legs.  She is able to ambulate well.  We will hold off on ultrasound at this time given both legs have swelling and she reports no pain.  I have asked that she elevate her legs and use compression stockings for the next 24 to 48 hours.  If symptoms do not improve or worsen, patient to call clinic (on-call MD) to discuss ultrasound of bilateral lower extremities.  Patient in agreement.  Disposition- -RTC as scheduled to begin radiation on 11/14/20.   -Return to clinic on 11/15/20 for possible thoracentesis -Ultrasound of bilateral lower extremities if symptoms do not improve with elevation and TED hose. -Orders placed.    Visit Diagnosis 1. Primary malignant neoplasm of lung metastatic to other site, unspecified laterality ([place])   2. Localized swelling of lower extremity    Patient expressed understanding  and was in agreement with this plan. She also understands that She can call clinic at any time with any questions, concerns, or complaints.   Greater than 50% was spent in counseling and coordination of care with this patient including but not limited to discussion of the relevant topics above (See A&P) including, but not limited to diagnosis and management of acute and chronic medical conditions.   Thank you for allowing me to participate in the care of this very pleasant patient.    Jacquelin Hawking, NP [place] [facility] - [phone] Pager- [phone] 11/09/2020 10:44 AM

## 2020-11-14 ENCOUNTER — Other Ambulatory Visit
Admission: RE | Admit: 2020-11-14 | Discharge: 2020-11-14 | Disposition: A | Discharge status: Home / Self Care | Payer: BC Managed Care – PPO | Source: Ambulatory Visit | Attending: Family Medicine | Admitting: Family Medicine

## 2020-11-14 ENCOUNTER — Other Ambulatory Visit: Payer: Self-pay

## 2020-11-14 ENCOUNTER — Ambulatory Visit
Admission: RE | Admit: 2020-11-14 | Discharge: 2020-11-14 | Disposition: A | Discharge status: Home / Self Care | Payer: BC Managed Care – PPO | Source: Ambulatory Visit | Attending: Radiation Oncology | Admitting: Radiation Oncology

## 2020-11-14 DIAGNOSIS — Z01812 Encounter for preprocedural laboratory examination: Secondary | ICD-10-CM | POA: Diagnosis not present

## 2020-11-14 DIAGNOSIS — Z20822 Contact with and (suspected) exposure to covid-19: Secondary | ICD-10-CM | POA: Diagnosis not present

## 2020-11-14 DIAGNOSIS — Z510 Encounter for antineoplastic radiation therapy: Secondary | ICD-10-CM | POA: Diagnosis not present

## 2020-11-14 LAB — SARS CORONAVIRUS 2 (TAT 6-24 HRS): SARS Coronavirus 2: NEGATIVE

## 2020-11-15 ENCOUNTER — Ambulatory Visit
Admission: RE | Admit: 2020-11-15 | Discharge: 2020-11-15 | Disposition: A | Discharge status: Home / Self Care | Payer: BC Managed Care – PPO | Source: Ambulatory Visit | Attending: Oncology | Admitting: Oncology

## 2020-11-15 ENCOUNTER — Other Ambulatory Visit: Payer: Self-pay

## 2020-11-15 ENCOUNTER — Other Ambulatory Visit: Payer: Self-pay | Admitting: Oncology

## 2020-11-15 ENCOUNTER — Ambulatory Visit
Admission: RE | Admit: 2020-11-15 | Discharge: 2020-11-15 | Disposition: A | Discharge status: Home / Self Care | Payer: BC Managed Care – PPO | Source: Ambulatory Visit | Attending: Radiation Oncology | Admitting: Radiation Oncology

## 2020-11-15 DIAGNOSIS — Z510 Encounter for antineoplastic radiation therapy: Secondary | ICD-10-CM | POA: Diagnosis not present

## 2020-11-15 DIAGNOSIS — J90 Pleural effusion, not elsewhere classified: Secondary | ICD-10-CM | POA: Diagnosis present

## 2020-11-16 ENCOUNTER — Ambulatory Visit
Admission: RE | Admit: 2020-11-16 | Discharge: 2020-11-16 | Disposition: A | Discharge status: Home / Self Care | Payer: BC Managed Care – PPO | Source: Ambulatory Visit | Attending: Radiation Oncology | Admitting: Radiation Oncology

## 2020-11-16 DIAGNOSIS — C3490 Malignant neoplasm of unspecified part of unspecified bronchus or lung: Secondary | ICD-10-CM | POA: Diagnosis present

## 2020-11-16 DIAGNOSIS — Z510 Encounter for antineoplastic radiation therapy: Secondary | ICD-10-CM | POA: Insufficient documentation

## 2020-11-17 ENCOUNTER — Ambulatory Visit
Admission: RE | Admit: 2020-11-17 | Discharge: 2020-11-17 | Disposition: A | Discharge status: Home / Self Care | Payer: BC Managed Care – PPO | Source: Ambulatory Visit | Attending: Radiation Oncology | Admitting: Radiation Oncology

## 2020-11-17 DIAGNOSIS — Z510 Encounter for antineoplastic radiation therapy: Secondary | ICD-10-CM | POA: Diagnosis not present

## 2020-11-18 ENCOUNTER — Inpatient Hospital Stay: Payer: BC Managed Care – PPO

## 2020-11-18 ENCOUNTER — Encounter: Payer: Self-pay | Admitting: *Deleted

## 2020-11-18 ENCOUNTER — Other Ambulatory Visit: Payer: Self-pay

## 2020-11-18 ENCOUNTER — Ambulatory Visit
Admission: RE | Admit: 2020-11-18 | Discharge: 2020-11-18 | Disposition: A | Discharge status: Home / Self Care | Payer: BC Managed Care – PPO | Source: Ambulatory Visit | Attending: Radiation Oncology | Admitting: Radiation Oncology

## 2020-11-18 ENCOUNTER — Inpatient Hospital Stay (HOSPITAL_BASED_OUTPATIENT_CLINIC_OR_DEPARTMENT_OTHER): Payer: BC Managed Care – PPO | Admitting: Oncology

## 2020-11-18 ENCOUNTER — Inpatient Hospital Stay: Payer: BC Managed Care – PPO | Admitting: Hospice and Palliative Medicine

## 2020-11-18 ENCOUNTER — Encounter: Payer: Self-pay | Admitting: Oncology

## 2020-11-18 ENCOUNTER — Inpatient Hospital Stay: Payer: BC Managed Care – PPO | Attending: Oncology

## 2020-11-18 VITALS — BP 104/65 | HR 113 | Temp 97.9°F | Resp 16 | Wt 141.7 lb

## 2020-11-18 VITALS — HR 99

## 2020-11-18 DIAGNOSIS — D649 Anemia, unspecified: Secondary | ICD-10-CM

## 2020-11-18 DIAGNOSIS — C3490 Malignant neoplasm of unspecified part of unspecified bronchus or lung: Secondary | ICD-10-CM | POA: Insufficient documentation

## 2020-11-18 DIAGNOSIS — I82210 Acute embolism and thrombosis of superior vena cava: Secondary | ICD-10-CM | POA: Insufficient documentation

## 2020-11-18 DIAGNOSIS — Z5111 Encounter for antineoplastic chemotherapy: Secondary | ICD-10-CM | POA: Insufficient documentation

## 2020-11-18 DIAGNOSIS — F17210 Nicotine dependence, cigarettes, uncomplicated: Secondary | ICD-10-CM | POA: Insufficient documentation

## 2020-11-18 DIAGNOSIS — G893 Neoplasm related pain (acute) (chronic): Secondary | ICD-10-CM | POA: Insufficient documentation

## 2020-11-18 DIAGNOSIS — Z800 Family history of malignant neoplasm of digestive organs: Secondary | ICD-10-CM | POA: Diagnosis not present

## 2020-11-18 DIAGNOSIS — D630 Anemia in neoplastic disease: Secondary | ICD-10-CM | POA: Diagnosis not present

## 2020-11-18 DIAGNOSIS — R634 Abnormal weight loss: Secondary | ICD-10-CM

## 2020-11-18 DIAGNOSIS — E538 Deficiency of other specified B group vitamins: Secondary | ICD-10-CM | POA: Diagnosis not present

## 2020-11-18 DIAGNOSIS — Z5112 Encounter for antineoplastic immunotherapy: Secondary | ICD-10-CM | POA: Insufficient documentation

## 2020-11-18 DIAGNOSIS — R000 Tachycardia, unspecified: Secondary | ICD-10-CM | POA: Insufficient documentation

## 2020-11-18 DIAGNOSIS — Z7189 Other specified counseling: Secondary | ICD-10-CM | POA: Diagnosis not present

## 2020-11-18 DIAGNOSIS — J90 Pleural effusion, not elsewhere classified: Secondary | ICD-10-CM | POA: Diagnosis not present

## 2020-11-18 DIAGNOSIS — Z803 Family history of malignant neoplasm of breast: Secondary | ICD-10-CM | POA: Insufficient documentation

## 2020-11-18 DIAGNOSIS — Z510 Encounter for antineoplastic radiation therapy: Secondary | ICD-10-CM | POA: Diagnosis not present

## 2020-11-18 DIAGNOSIS — J910 Malignant pleural effusion: Secondary | ICD-10-CM | POA: Diagnosis not present

## 2020-11-18 LAB — CBC WITH DIFFERENTIAL/PLATELET
Abs Immature Granulocytes: 0.06 10*3/uL (ref 0.00–0.07)
Basophils Absolute: 0 10*3/uL (ref 0.0–0.1)
Basophils Relative: 0 %
Eosinophils Absolute: 0 10*3/uL (ref 0.0–0.5)
Eosinophils Relative: 0 %
HCT: 30.9 % — ABNORMAL LOW (ref 36.0–46.0)
Hemoglobin: 10.2 g/dL — ABNORMAL LOW (ref 12.0–15.0)
Immature Granulocytes: 1 %
Lymphocytes Relative: 9 %
Lymphs Abs: 1 10*3/uL (ref 0.7–4.0)
MCH: 25.9 pg — ABNORMAL LOW (ref 26.0–34.0)
MCHC: 33 g/dL (ref 30.0–36.0)
MCV: 78.4 fL — ABNORMAL LOW (ref 80.0–100.0)
Monocytes Absolute: 0.8 10*3/uL (ref 0.1–1.0)
Monocytes Relative: 8 %
Neutro Abs: 8.9 10*3/uL — ABNORMAL HIGH (ref 1.7–7.7)
Neutrophils Relative %: 82 %
Platelets: 348 10*3/uL (ref 150–400)
RBC: 3.94 MIL/uL (ref 3.87–5.11)
RDW: 13.7 % (ref 11.5–15.5)
WBC: 10.8 10*3/uL — ABNORMAL HIGH (ref 4.0–10.5)
nRBC: 0 % (ref 0.0–0.2)

## 2020-11-18 LAB — COMPREHENSIVE METABOLIC PANEL
ALT: 20 U/L (ref 0–44)
AST: 23 U/L (ref 15–41)
Albumin: 2.6 g/dL — ABNORMAL LOW (ref 3.5–5.0)
Alkaline Phosphatase: 75 U/L (ref 38–126)
Anion gap: 9 (ref 5–15)
BUN: 11 mg/dL (ref 8–23)
CO2: 27 mmol/L (ref 22–32)
Calcium: 8.5 mg/dL — ABNORMAL LOW (ref 8.9–10.3)
Chloride: 96 mmol/L — ABNORMAL LOW (ref 98–111)
Creatinine, Ser: 0.7 mg/dL (ref 0.44–1.00)
GFR, Estimated: >60 mL/min (ref >60)
Glucose, Bld: 116 mg/dL — ABNORMAL HIGH (ref 70–99)
Potassium: 3.8 mmol/L (ref 3.5–5.1)
Sodium: 132 mmol/L — ABNORMAL LOW (ref 135–145)
Total Bilirubin: 0.5 mg/dL (ref 0.3–1.2)
Total Protein: 6.9 g/dL (ref 6.5–8.1)

## 2020-11-18 LAB — PROTEIN, URINE, RANDOM: Total Protein, Urine: <6 mg/dL

## 2020-11-18 MED ORDER — SODIUM CHLORIDE 0.9 % IV SOLN
500.0000 mg/m2 | Freq: Once | INTRAVENOUS | Status: AC
  Administered 2020-11-18: 900 mg via INTRAVENOUS
  Filled 2020-11-18: qty 20

## 2020-11-18 MED ORDER — SODIUM CHLORIDE 0.9 % IV SOLN
Freq: Once | INTRAVENOUS | Status: AC
  Filled 2020-11-18: qty 250

## 2020-11-18 MED ORDER — SODIUM CHLORIDE 0.9 % IV SOLN
15.0000 mg/kg | Freq: Once | INTRAVENOUS | Status: AC
  Administered 2020-11-18: 1000 mg via INTRAVENOUS
  Filled 2020-11-18: qty 8

## 2020-11-18 MED ORDER — PALONOSETRON HCL INJECTION 0.25 MG/5ML
0.2500 mg | Freq: Once | INTRAVENOUS | Status: AC
  Administered 2020-11-18: 0.25 mg via INTRAVENOUS
  Filled 2020-11-18: qty 5

## 2020-11-18 MED ORDER — SODIUM CHLORIDE 0.9 % IV SOLN
503.5000 mg | Freq: Once | INTRAVENOUS | Status: AC
  Administered 2020-11-18: 500 mg via INTRAVENOUS
  Filled 2020-11-18: qty 50

## 2020-11-18 MED ORDER — OXYCODONE HCL 5 MG PO TABS: 5.0000 mg | ORAL_TABLET | Freq: Four times a day (QID) | ORAL | 0 refills | Status: DC | PRN

## 2020-11-18 MED ORDER — SODIUM CHLORIDE 0.9 % IV SOLN
150.0000 mg | Freq: Once | INTRAVENOUS | Status: AC
  Administered 2020-11-18: 150 mg via INTRAVENOUS
  Filled 2020-11-18: qty 150

## 2020-11-18 MED ORDER — SODIUM CHLORIDE 0.9 % IV SOLN
10.0000 mg | Freq: Once | INTRAVENOUS | Status: AC
  Administered 2020-11-18: 10 mg via INTRAVENOUS
  Filled 2020-11-18: qty 10

## 2020-11-18 NOTE — Progress Notes (Signed)
Pt here for tx. C1D1 Bevacizumab, Alimta , Carboplatin. Consent obtained. Recheck of HR was 99. Ok to proceed. Tx given as ordered. Completed without incident. Pt discharged stable.

## 2020-11-18 NOTE — Progress Notes (Signed)
HR recheck is 99 per RN

## 2020-11-18 NOTE — Progress Notes (Signed)
Hematology/Oncology Follow Up Note [facility]  Telephone:([phone] [phone]  Patient Care Team: Ranae Plumber, [place] as PCP - General (Family Medicine) Telford Nab, RN as Oncology Nurse Navigator   Name of the patient: Brittney Fisher  [id number]  [birth year]   REASON FOR VISIT  follow-up for lung mass pleural effusion  PERTINENT ONCOLOGY HISTORY 59 y.o. female with past medical history including GERD, COPD, current everyday smoker presents for follow-up of lung mass and pleural effusion. 10/20/2020, patient was brought to ED via EMS due to generalized weakness, dizziness, chest pain shortness of breath.  She was recently diagnosed with COPD approximately 1 month ago by primary care provider.  Also unintentional weight loss during the last few months. Image work-up showed right-sided pleural effusion.  She underwent right thoracentesis.  With removal of 2.4 L of fluid. 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  Metastatic disease to low cervical/thoracic nodes, lung, right pleural space and bones.  Moderate to large right pleural effusion.  SVC narrowing. 10/21/2020 MRI brain is negative for metastasis.  Mild chronic microvascular ischemic changes in the white matter.  Negative for acute infarct Regarding to the SVC narrowing, patient was seen by vascular surgeon and was recommended no intervention inpatient.  Patient to follow-up outpatient with vascular surgeon for evaluation. Patient also was treated for COPD exacerbation with hypoxia on exertion. Qualifies for home oxygen and hospitalist arrange home health and a nebulizer.  Patient was given a course of prednisone taper and empiric Levaquin. Patient was discharged and present today to follow-up with cytology results and further management plan  10/27/2020 PET scan showed right upper lobe primary bronchogenic  mass with direct invasion into mediastinum. Metastatic disease to the right pleural space, lung, bone, nodes of the chest and less so lower leg/upper abdomen. Hypermetabolic him along the course of the right axillary vein with concurrent subtle hyper attenuation within the axillary vein and SVC.  This continues to the level of SVC narrowing.  Suspicious for SVC occlusion and developing thrombus. Moderate right pleural effusion and small pericardial effusion.  # # left supraclavicular mass biopsy- pathology is positive for adenocarcinoma. positive for CK7, with patchy, weak CK20. They are  negative for TTF1, NapsinA, GATA3, CDX2, and Pax8. The findings are  nonspecific; but may be compatible with a poorly differentiated  adenocarcinoma of lung origin, especially given the imaging findings  She also had another repeat thoracentesis and fluid cytology showed malignancy.  INTERVAL HISTORY AMAIYA Fisher is a 59 y.o. female who has above history reviewed by me today presents for follow up visit for management of metastatic lung cancer.  Problems and complaints are listed below: During the interval she has been seen by Radonc and started palliative RT since 11/14/2020. She also has had another attempt of ultrasound guided thoracentesis with no drainable fluid. Shortness of breath is stable. She feels that the pain medicine is helping.  Pain is controlled well.  Needs a refill. Appetite is poor.  Review of Systems  Constitutional: Positive for fatigue. Negative for appetite change, chills, fever and unexpected weight change.  HENT:   Negative for hearing loss and voice change.   Eyes: Negative for eye problems.  Respiratory: Positive for shortness of breath. Negative for chest tightness and cough.   Cardiovascular: Negative for chest pain.  Gastrointestinal: Negative for abdominal distention, abdominal pain and blood in stool.  Endocrine: Negative for hot flashes.  Genitourinary: Negative for  difficulty urinating and frequency.   Musculoskeletal: Negative for arthralgias.       Right side chest wall pain has improved.   Skin: Negative for itching and rash.  Neurological: Negative for extremity weakness.  Hematological: Negative for adenopathy.  Psychiatric/Behavioral: Negative for confusion.      No Known Allergies   Past Medical History:  Diagnosis Date  . COPD (chronic obstructive pulmonary disease) ([place])   . GERD (gastroesophageal reflux disease)   . Metastatic lung cancer (metastasis from lung to other site) [facility]) 11/02/2020     Past Surgical History:  Procedure Laterality Date  . COLONOSCOPY WITH ESOPHAGOGASTRODUODENOSCOPY (EGD)    . COLONOSCOPY WITH PROPOFOL N/A 11/14/2015   Procedure: COLONOSCOPY WITH PROPOFOL;  Surgeon: Hulen Luster, MD;  Location: [facility] ENDOSCOPY;  Service: Gastroenterology;  Laterality: N/A;  . ESOPHAGOGASTRODUODENOSCOPY (EGD) WITH PROPOFOL N/A 11/14/2015   Procedure: ESOPHAGOGASTRODUODENOSCOPY (EGD) WITH PROPOFOL;  Surgeon: Hulen Luster, MD;  Location: [facility] ENDOSCOPY;  Service: Gastroenterology;  Laterality: N/A;    Social History   Socioeconomic History  . Marital status: Married    Spouse name: Not on file  . Number of children: Not on file  . Years of education: Not on file  . Highest education level: Not on file  Occupational History  . Not on file  Tobacco Use  . Smoking status: Current Every Day Smoker    Packs/day: 0.20    Types: Cigarettes  . Smokeless tobacco: Never Used  Substance and Sexual Activity  . Alcohol use: Never  . Drug use: Never  . Sexual activity: Not Currently  Other Topics Concern  . Not on file  Social History Narrative  . Not on file   Social Determinants of Health   Financial Resource Strain:   . Difficulty of Paying Living Expenses: Not on file  Food Insecurity:   . Worried About Charity fundraiser in the Last Year: Not on file  . Ran Out of Food in the Last Year: Not on file  Transportation  Needs:   . Lack of Transportation (Medical): Not on file  . Lack of Transportation (Non-Medical): Not on file  Physical Activity:   . Days of Exercise per Week: Not on file  . Minutes of Exercise per Session: Not on file  Stress:   . Feeling of Stress : Not on file  Social Connections:   . Frequency of Communication with Friends and Family: Not on file  . Frequency of Social Gatherings with Friends and Family: Not on file  . Attends Religious Services: Not on file  . Active Member of Clubs or Organizations: Not on file  . Attends Archivist Meetings: Not on file  . Marital Status: Not on file  Intimate Partner Violence:   . Fear of Current or Ex-Partner: Not on file  . Emotionally Abused: Not on file  . Physically Abused: Not on file  . Sexually Abused: Not on file    Family History  Problem Relation Age of Onset  . Breast cancer Cousin 62  . Diabetes type II Mother   . Hypertension Mother   . Colon cancer Father   . Hypertension Father      Current Outpatient Medications:  .  acetaminophen (TYLENOL) 500 MG tablet, Take 1,000 mg by mouth every 8 (eight) hours as needed for moderate pain. (Patient not taking: Reported on 11/04/2020), Disp: , Rfl:  .  benzonatate (TESSALON) 100 MG capsule, Take 1 capsule (100 mg  total) by mouth 3 (three) times daily as needed for cough., Disp: 60 capsule, Rfl: 1 .  dexamethasone (DECADRON) 4 MG tablet, Take 1 tab (4 mg) twice a day the day before Alimta chemo. Take 2 tablets (8 mg) po daily x 3 days starting the day after chemo. (Patient not taking: Reported on 11/04/2020), Disp: 30 tablet, Rfl: 1 .  feeding supplement (ENSURE ENLIVE / ENSURE PLUS) LIQD, Take 237 mLs by mouth 2 (two) times daily between meals., Disp: 237 mL, Rfl: 12 .  folic acid (FOLVITE) 1 MG tablet, Take 1 tablet (1 mg total) by mouth daily. Start 5-7 days before Alimta chemotherapy. Continue until 21 days after Alimta completed., Disp: 100 tablet, Rfl: 3 .   guaiFENesin (MUCINEX) 600 MG 12 hr tablet, Take 1 tablet (600 mg total) by mouth 2 (two) times daily., Disp: 14 tablet, Rfl: 0 .  ipratropium-albuterol (DUONEB) 0.5-2.5 (3) MG/3ML SOLN, Take 3 mLs by nebulization 3 (three) times daily., Disp: 360 mL, Rfl: 1 .  lidocaine-prilocaine (EMLA) cream, Apply to affected area once, Disp: 30 g, Rfl: 3 .  omeprazole (PRILOSEC) 20 MG capsule, Take 20 mg by mouth daily., Disp: , Rfl:  .  ondansetron (ZOFRAN) 8 MG tablet, Take 1 tablet (8 mg total) by mouth 2 (two) times daily as needed for refractory nausea / vomiting. Start on day 3 after chemo. (Patient not taking: Reported on 11/04/2020), Disp: 30 tablet, Rfl: 1 .  oxyCODONE (OXY IR/ROXICODONE) 5 MG immediate release tablet, Take 1 tablet (5 mg total) by mouth every 6 (six) hours as needed for severe pain., Disp: 60 tablet, Rfl: 0 .  prochlorperazine (COMPAZINE) 10 MG tablet, Take 1 tablet (10 mg total) by mouth every 6 (six) hours as needed (Nausea or vomiting). (Patient not taking: Reported on 11/04/2020), Disp: 30 tablet, Rfl: 1 .  tiotropium (SPIRIVA HANDIHALER) 18 MCG inhalation capsule, Place 18 mcg into inhaler and inhale daily., Disp: , Rfl:   Physical exam:  Vitals:   11/18/20 0825  BP: 104/65  Pulse: (!) 113  Resp: 16  Temp: 97.9 F (36.6 C)  TempSrc: Tympanic  SpO2: 99%  Weight: 141 lb 11.2 oz (64.3 kg)   Physical Exam Constitutional:      General: She is not in acute distress.    Comments: Thin built female walks independently  HENT:     Head: Normocephalic and atraumatic.  Eyes:     General: No scleral icterus. Cardiovascular:     Rate and Rhythm: Normal rate and regular rhythm.     Heart sounds: Normal heart sounds.  Pulmonary:     Effort: Pulmonary effort is normal. No respiratory distress.     Breath sounds: No wheezing.     Comments: Decreased breath sounds in right lower lung Abdominal:     General: Bowel sounds are normal. There is no distension.     Palpations: Abdomen  is soft.  Musculoskeletal:        General: No deformity. Normal range of motion.     Cervical back: Normal range of motion and neck supple.  Skin:    General: Skin is warm and dry.     Findings: No erythema or rash.  Neurological:     Mental Status: She is alert and oriented to person, place, and time. Mental status is at baseline.     Cranial Nerves: No cranial nerve deficit.     Coordination: Coordination normal.  Psychiatric:        Mood and Affect:  Mood normal.     CMP Latest Ref Rng & Units 10/21/2020  Glucose 70 - 99 mg/dL 101(H)  BUN 8 - 23 mg/dL 13  Creatinine 0.44 - 1.00 mg/dL 0.76  Sodium 135 - 145 mmol/L 134(L)  Potassium 3.5 - 5.1 mmol/L 3.9  Chloride 98 - 111 mmol/L 102  CO2 22 - 32 mmol/L 23  Calcium 8.9 - 10.3 mg/dL 8.5(L)  Total Protein 6.5 - 8.1 g/dL 6.4(L)  Total Bilirubin 0.3 - 1.2 mg/dL 0.6  Alkaline Phos 38 - 126 U/L 62  AST 15 - 41 U/L 16  ALT 0 - 44 U/L 17   CBC Latest Ref Rng & Units 10/21/2020  WBC 4.0 - 10.5 K/uL 14.0(H)  Hemoglobin 12.0 - 15.0 g/dL 11.9(L)  Hematocrit 36 - 46 % 35.3(L)  Platelets 150 - 400 K/uL 242    RADIOGRAPHIC STUDIES: I have personally reviewed the radiological images as listed and agreed with the findings in the report. MR BRAIN W WO CONTRAST  Result Date: 10/21/2020 CLINICAL DATA:  New diagnosis lung cancer. Rule out metastatic disease. Mental status change. EXAM: MRI HEAD WITHOUT AND WITH CONTRAST TECHNIQUE: Multiplanar, multiecho pulse sequences of the brain and surrounding structures were obtained without and with intravenous contrast. CONTRAST:  14mL GADAVIST GADOBUTROL 1 MMOL/ML IV SOLN COMPARISON:  None. FINDINGS: Brain: Negative for metastatic disease. No enhancing mass lesion in the brain. No cerebral edema identified. Negative for acute infarct. Scattered small white matter hyperintensities consistent with chronic microvascular ischemia. Negative for hemorrhage or mass lesion. Vascular: Normal arterial flow voids Skull  and upper cervical spine: No focal skeletal lesion. Sinuses/Orbits: Paranasal sinuses clear.  Negative orbit Other: None IMPRESSION: Negative for metastatic disease Mild chronic microvascular ischemic change in the white matter. Negative for acute infarct. Electronically Signed   By: Franchot Gallo M.D.   On: 10/21/2020 18:56   [place] CHEST (PLEURAL EFFUSION)  Result Date: 11/15/2020 CLINICAL DATA:  59 year old with lung cancer and history of right pleural effusion. Evaluate pleural fluid for possible thoracentesis. Right thoracentesis was performed on 10/31/2020. EXAM: CHEST ULTRASOUND COMPARISON:  10/31/2020 FINDINGS: Small amount of loculated pleural fluid identified in the right lower lateral chest. No significant left pleural fluid. IMPRESSION: Small amount of loculated right pleural fluid. Therapeutic thoracentesis was not performed due to the small amount of pleural fluid. Electronically Signed   By: Markus Daft M.D.   On: 11/15/2020 14:43   NM PET Image Initial (PI) Skull Base To Thigh  Result Date: 10/28/2020 CLINICAL DATA:  Initial treatment strategy for right upper lobe primary bronchogenic carcinoma. Malignant pleural effusion. EXAM: NUCLEAR MEDICINE PET SKULL BASE TO THIGH TECHNIQUE: 8.5 mCi F-18 FDG was injected intravenously. Full-ring PET imaging was performed from the skull base to thigh after the radiotracer. CT data was obtained and used for attenuation correction and anatomic localization. Fasting blood glucose: 91 mg/dl COMPARISON:  Chest CT 10/18/2020 FINDINGS: Mediastinal blood pool activity: SUV max 1.9 Liver activity: SUV max Not applicable. NECK: Hypermetabolic low right jugular/supraclavicular node measures 1.5 cm and a S.U.V. max of 7.0 on 59/3. CT findings: none CHEST: Central right upper lobe lung mass with direct mediastinal invasion. This measured 7.1 x 6.4 cm on prior diagnostic CT and a S.U.V. max of 9.6 on 86/3 today. Extensive right-sided pleural metastasis. The index implant  on the prior CT corresponds to hypermetabolism, including at a S.U.V. max of 7.4 on 130/3 today. Left suprahilar node measures 1.9 cm and a S.U.V. max of 8.6 on 87/3.  Left infrahilar adenopathy at 1.6 cm and a S.U.V. max of 8.1 on 107/3. Extensive hypermetabolic pulmonary metastasis. Example in the left lower lobe at 1.7 cm and a S.U.V. max of 4.4 on 117/3. Hypermetabolism along the course of the right axillary vein, including at a S.U.V. max of 5.5 on 65/3. The vein is subtly hyperattenuating, and followed centrally to the level of SVC narrowing, including on 73/3. Incidental CT findings: Deferred to recent diagnostic CT. Moderate right pleural effusion is not significantly changed. Similar small pericardial effusion. ABDOMEN/PELVIS: A node adjacent to the right adrenal measures 5 mm and a S.U.V. max of 4.8 on 151/3. No abdominopelvic parenchymal hypermetabolism identified. Incidental CT findings: Abdominal aortic atherosclerosis. Bilateral adrenal thickening suggesting hyperplasia. Large colonic stool burden. SKELETON: Multifocal osseous metastasis. Index left posterior iliac lesion measures 8 mm and a S.U.V. max of 8.8 on 203/3. T2 lytic lesion is eccentric left at 11 mm and a S.U.V. max of 10.1 on 60/3. Incidental CT findings: none IMPRESSION: 1. Right upper lobe primary bronchogenic carcinoma with direct mediastinal invasion. 2. Metastatic disease to right pleural space, lungs, bones, nodes of the chest and less so lower neck/upper abdomen. 3. Hypermetabolism along the course of the right axillary vein with concurrent subtle hyperattenuation within the axillary vein and SVC. This continues to the level of SVC narrowing, suspicious for SVC occlusion and developing thrombus. Recommend clinical correlation for SVC syndrome. 4. Moderate right pleural effusion and small pericardial effusion, similar. Electronically Signed   By: Abigail Miyamoto M.D.   On: 10/28/2020 09:55   DG Chest Port 1 View  Result Date:  10/31/2020 CLINICAL DATA:  Right thoracentesis EXAM: PORTABLE CHEST 1 VIEW COMPARISON:  10/20/2020 FINDINGS: Right hilar mass with upper lobe collapse/replacement. Multiple pulmonary nodules. Small right pleural effusion in the frontal projection. No evidence of pneumothorax. Stable heart size. IMPRESSION: Stable from 10/20/2020.  No complicating features of thoracentesis. Electronically Signed   By: Monte Fantasia M.D.   On: 10/31/2020 09:14   DG Chest Port 1 View  Result Date: 10/20/2020 CLINICAL DATA:  No known primary, now with concern for malignant right-sided pleural effusion post thoracentesis. EXAM: PORTABLE CHEST 1 VIEW COMPARISON:  Chest radiograph-earlier same day; chest CT-10/18/2020 FINDINGS: Interval reduction in persistent trace right-sided effusion post thoracentesis. No pneumothorax. Improved aeration of the right mid and lower lung with persistent right basilar consolidative opacities, likely atelectasis. Redemonstrated extensive bilateral nodular opacities with obscuration of the right heart border secondary to persistent atelectasis/collapse of the right upper lobe. No left-sided pleural effusion or pneumothorax. No evidence of edema. No acute osseous abnormalities. IMPRESSION: 1. Interval reduction in persistent trace right-sided effusion post thoracentesis. No pneumothorax. 2. Improved aeration of the right mid and lower lung with persistent right basilar opacities, likely atelectasis. 3. Similar findings of right upper lobe collapse and extensive metastatic disease involving the lungs bilaterally as better demonstrated recent chest CT. Electronically Signed   By: Sandi Mariscal M.D.   On: 10/20/2020 12:17   DG Chest Port 1 View  Result Date: 10/20/2020 CLINICAL DATA:  Shortness of breath EXAM: PORTABLE CHEST 1 VIEW COMPARISON:  10/04/2020.  Chest CT from 2 days ago FINDINGS: Large right pleural effusion obscuring most of the right lung. Reticulonodular opacity on the left due to  multiple pulmonary nodules. Normal heart size. Thoracic adenopathy by CT. IMPRESSION: Extensive intrathoracic malignancy by recent chest CT. Very large right pleural effusion. Electronically Signed   By: Monte Fantasia M.D.   On: 10/20/2020 04:41  [place] CORE BIOPSY (LYMPH NODES)  Result Date: 10/31/2020 INDICATION: Lung carcinoma.  Hypermetabolic right supraclavicular adenopathy. EXAM: ULTRASOUND GUIDED CORE BIOPSY OF RIGHT SUPRACLAVICULAR ADENOPATHY MEDICATIONS: No antibiotics indicated ANESTHESIA/SEDATION: Lidocaine 1% subcutaneous PROCEDURE: The procedure, risks, benefits, and alternatives were explained to the patient. Questions regarding the procedure were encouraged and answered. The patient understands and consents to the procedure. Survey ultrasound of the right supraclavicular region was performed. The adenopathy was localized and an appropriate skin entry site was determined and marked. Thrombosis of the right IJ vein incidentally noted. The operative field was prepped with chlorhexidine in a sterile fashion, and a sterile drape was applied covering the operative field. A sterile gown and sterile gloves were used for the procedure. Local anesthesia was provided with 1% Lidocaine. Under real-time ultrasound guidance, multiple 18-gauge core biopsy samples were obtained, submitted in saline on Telfa to surgical pathology. The guide needle was removed. Postprocedure scans show no hemorrhage or other apparent complication. The patient tolerated the procedure well. COMPLICATIONS: None immediate. FINDINGS: Right supraclavicular adenopathy was localized. Representative core biopsy samples obtained as above. Thrombosis of the right IJ vein incidentally noted. IMPRESSION: 1. Technically successful ultrasound-guided core biopsy, right supraclavicular adenopathy. 2. Right IJ venous thrombosis incidentally noted. Electronically Signed   By: Lucrezia Europe M.D.   On: 10/31/2020 11:01   US THORACENTESIS ASP PLEURAL  SPACE W/IMG GUIDE  Result Date: 10/31/2020 INDICATION: Right lung carcinoma. Pleural metastases and recurrent symptomatic effusion EXAM: ULTRASOUND GUIDED RIGHT THORACENTESIS MEDICATIONS: Lidocaine 1% subcutaneous COMPLICATIONS: None immediate. Postprocedure chest radiograph shows no pneumothorax. PROCEDURE: An ultrasound guided thoracentesis was thoroughly discussed with the patient and questions answered. The benefits, risks, alternatives and complications were also discussed. The patient understands and wishes to proceed with the procedure. Written consent was obtained. Ultrasound was performed to localize and mark an adequate pocket of fluid in the right chest. The area was then prepped and draped in the normal sterile fashion. 1% Lidocaine was used for local anesthesia. Under ultrasound guidance a 6 Fr Safe-T-Centesis catheter was introduced. Thoracentesis was performed. The catheter was removed and a dressing applied. FINDINGS: A total of approximately 2.3 L of red tinged fluid was removed. Samples were sent to the laboratory as requested by the clinical team. IMPRESSION: Successful ultrasound guided right thoracentesis yielding 2.3 L of pleural fluid. Electronically Signed   By: Lucrezia Europe M.D.   On: 10/31/2020 11:02   US THORACENTESIS ASP PLEURAL SPACE W/IMG GUIDE  Result Date: 10/20/2020 INDICATION: No known primary, now with concern for malignant right-sided pleural effusion. Please perform ultrasound-guided thoracentesis for diagnostic and therapeutic purposes. EXAM: US THORACENTESIS ASP PLEURAL SPACE W/IMG GUIDE COMPARISON:  Chest CT-10/18/2020 MEDICATIONS: None. COMPLICATIONS: None immediate. TECHNIQUE: Informed written consent was obtained from the patient after a discussion of the risks, benefits and alternatives to treatment. A timeout was performed prior to the initiation of the procedure. Initial ultrasound scanning demonstrates a large complex right-sided pleural effusion with large amount  floating echogenic debris within the pleural fluid but without definitive loculations. The lower chest was prepped and draped in the usual sterile fashion. 1% lidocaine was used for local anesthesia. An ultrasound image was saved for documentation purposes. An 8 Fr Safe-T-Centesis catheter was introduced. The thoracentesis was performed. The catheter was removed and a dressing was applied. The patient tolerated the procedure well without immediate post procedural complication. The patient was escorted to have an upright chest radiograph. FINDINGS: A total of approximately 2.4 liters of serous fluid was removed. Requested  samples were sent to the laboratory. IMPRESSION: Successful ultrasound-guided right sided thoracentesis yielding 2.4 liters of pleural fluid. Electronically Signed   By: Sandi Mariscal M.D.   On: 10/20/2020 12:15     Assessment and plan  1. Primary malignant neoplasm of lung metastatic to other site, unspecified laterality ([place])   2. Pleural effusion   3. Weight loss   4. Goals of care, counseling/discussion   5. Neoplasm related pain   6. Anemia, unspecified type   7. 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  cT4 N3 M1 Labs are reviewed and discussed with patient. Proceed with cycle 1 carboplatin, Alimta, bevacizumab. Hold immunotherapy due to palliative radiation. Hold G-CSF support due to radiation. Monitor counts weekly. She has received vitamin B12 injection x1. Continue folic acid. NGS is pending   #Bronchial obstruction and SVC occlusion.  Continue palliative radiation She has follow-up appointment with vascular surgeon Dr. Lucky Cowboy for further management and Dr. Lucky Cowboy will determine if she  is a candidate for Mediport placement.  #Neoplasm related pain, continue oxycodone 5 mg every 6 hours as needed. #Unintentional weight loss, refer to nutritionist.  Recommend nutrition supplementation. #Tachycardic, heart rate was initially 113.  After oral hydration, heart rate improved to 90s. #Anemia, MCV trending down.  Multifactorial.  Secondary to radiation, chronic disease, Will check iron panel and reticulocyte panel at the next visit  #Right pleural effusion, stable.  Follow-up on 1 week  [name], MD, PhD Hematology Oncology [facility] Pager- [phone] 11/18/2020

## 2020-11-18 NOTE — Progress Notes (Signed)
  Oncology Nurse Navigator Documentation  Navigator Location: [facility] Onc (11/18/20 1200)   )Navigator Encounter Type: Follow-up Appt;Treatment (11/18/20 1200)                   Treatment Initiated Date: 11/14/20 (11/18/20 1200) Patient Visit Type: MedOnc (11/18/20 1200) Treatment Phase: First Chemo Tx (11/18/20 1200) Barriers/Navigation Needs: No Barriers At This Time;No Needs;No Questions (11/18/20 1200)   Interventions: None Required (11/18/20 1200)         met with patient during follow up visit with Dr. [name] prior to starting chemotherapy treatments. All questions answered during visit. Pt informed that will receive print out of appts during infusion today. Instructed to call with any further questions or needs. Pt verbalized understanding.             Time Spent with Patient: 30 (11/18/20 1200)

## 2020-11-18 NOTE — Progress Notes (Signed)
Leg swelling has slightly improved.  The Oxycodone is helping with her pain and needs a refill today.

## 2020-11-21 ENCOUNTER — Encounter: Payer: Self-pay | Admitting: *Deleted

## 2020-11-21 ENCOUNTER — Inpatient Hospital Stay: Payer: BC Managed Care – PPO

## 2020-11-21 ENCOUNTER — Ambulatory Visit
Admission: RE | Admit: 2020-11-21 | Discharge: 2020-11-21 | Disposition: A | Discharge status: Home / Self Care | Payer: BC Managed Care – PPO | Source: Ambulatory Visit | Attending: Radiation Oncology | Admitting: Radiation Oncology

## 2020-11-21 ENCOUNTER — Inpatient Hospital Stay (HOSPITAL_BASED_OUTPATIENT_CLINIC_OR_DEPARTMENT_OTHER): Payer: BC Managed Care – PPO | Admitting: Nurse Practitioner

## 2020-11-21 ENCOUNTER — Other Ambulatory Visit: Payer: Self-pay

## 2020-11-21 VITALS — BP 121/77 | HR 110 | Temp 97.9°F | Resp 18

## 2020-11-21 DIAGNOSIS — G893 Neoplasm related pain (acute) (chronic): Secondary | ICD-10-CM

## 2020-11-21 DIAGNOSIS — Z5111 Encounter for antineoplastic chemotherapy: Secondary | ICD-10-CM | POA: Diagnosis not present

## 2020-11-21 DIAGNOSIS — Z510 Encounter for antineoplastic radiation therapy: Secondary | ICD-10-CM | POA: Diagnosis not present

## 2020-11-21 MED ORDER — OXYCODONE HCL 5 MG PO TABS: 5.0000 mg | ORAL_TABLET | ORAL | 0 refills | Status: DC | PRN

## 2020-11-21 NOTE — Progress Notes (Signed)
Radiation staff requested [facility] visit for pt this am regarding pain. Pain intensified last night . Pain is in R upper chest and R upper back. Pain is 8/10. 1 tablet every 6 hours. Took 1 at 5:30 this am.Pill helps a ittle bit initially then it wears off

## 2020-11-21 NOTE — Progress Notes (Signed)
Patient complaint of not feeling well.  Had a bad night with pain.   VSS.   Patient offered [facility] but did not want to do that today.

## 2020-11-21 NOTE — Progress Notes (Signed)
Nutrition Assessment   Reason for Assessment:   Weight loss, poor appetite   ASSESSMENT:  59 year old female with metastatic lung cancer and malignant pleural effusion.  Past medical history of GERD, COPD, smoker.  Patient receiving palliative radiation and carboplatin, alimta, bevacizumab.  Met with patient and niece, [name] after radiation.  Patient reports that she did not sleep last night due to pain.  Patient shifting in chair during visit unable to get comfortable.  Complains of back pain.  Reports appetite is up and down.  Ate cabbage, pork chop and sweet potato pie for dinner yesterday and spaghetti as well yesterday.  Has not tried oral nutrition supplements.  Reports constipation and takes stool softners. Denies nausea.      Nutrition Focused Physical Exam: not done as patient uncomfortable   Medications: colace, decadron, compazine, folic acid, prilosec, zofran   Labs: reviewed   Anthropometrics:   Height: 71 inches Weight: 141 lb 11.2 oz on 12/3 10/19/20 151 lb noted Patient reports 40 lb weight loss in the last 4-5 months BMI: 19  22% weight loss in the last 4-5 months, significant   Estimated Energy Needs  Kcals: 1900-2200 Protein: 95-110 g Fluid: > 1.9 L   NUTRITION DIAGNOSIS: Inadequate oral intake related to cancer and pain as evidenced by 22% weight loss in the last 4-5 months and decreased intake   INTERVENTION:  Discussed oral nutrition supplements. Recommend 350 calorie shakes or higher at least 1-2 times per day.  Samples given of ensure complete, ensure enlive, boost plus, [employer] 1.4 and coupons.  Discussed briefly, ways to add calories and protein in diet.  Handout provided. Patient originally declined being seen in [facility] today but has agreed to be seen after nutrition visit.   Contact information provided   MONITORING, EVALUATION, GOAL: weight trends, intake   Next Visit: Dec 16 during infusion  Mckinsley Koelzer B. [name], [place], [place] Registered  Dietitian [phone] (mobile)

## 2020-11-21 NOTE — Progress Notes (Signed)
[facility] [place]  [phone] [phone]  Patient Care Team: Ranae Plumber, [place] as PCP - General (Family Medicine) Telford Nab, RN as Oncology Nurse Navigator   Name of the patient: Brittney Fisher  [id number]  25-Feb-1961   Date of visit: 11/21/20  Diagnosis-metastatic lung cancer  Chief complaint/ Reason for visit-pain  Heme/Onc history:  Oncology History  Metastatic lung cancer (metastasis from lung to other site) [facility])  11/02/2020 Initial Diagnosis   Metastatic lung cancer (metastasis from lung to other site) [facility])   11/18/2020 -  Chemotherapy   The patient had dexamethasone (DECADRON) 4 MG tablet, 1 of 1 cycle, Start date: 11/02/2020, End date: -- palonosetron (ALOXI) injection 0.25 mg, 0.25 mg, Intravenous,  Once, 1 of 5 cycles Administration: 0.25 mg (11/18/2020) PEMEtrexed (ALIMTA) 900 mg in sodium chloride 0.9 % 100 mL chemo infusion, 500 mg/m2 = 900 mg, Intravenous,  Once, 1 of 5 cycles Administration: 900 mg (11/18/2020) CARBOplatin (PARAPLATIN) 500 mg in sodium chloride 0.9 % 250 mL chemo infusion, 500 mg (100 % of original dose 503.5 mg), Intravenous,  Once, 1 of 5 cycles Dose modification:   (original dose 503.5 mg, Cycle 1) Administration: 500 mg (11/18/2020) fosaprepitant (EMEND) 150 mg in sodium chloride 0.9 % 145 mL IVPB, 150 mg, Intravenous,  Once, 1 of 5 cycles Administration: 150 mg (11/18/2020) bevacizumab-bvzr (ZIRABEV) 1,000 mg in sodium chloride 0.9 % 100 mL chemo infusion, 15 mg/kg = 1,000 mg, Intravenous,  Once, 1 of 5 cycles Administration: 1,000 mg (11/18/2020)  for chemotherapy treatment.    Primary malignant neoplasm of lung metastatic to other site [facility])  11/04/2020 Initial Diagnosis   Primary malignant neoplasm of lung metastatic to other site [facility])   11/04/2020 Cancer Staging   Staging form: Lung, AJCC 8th Edition - Clinical stage from 11/04/2020: Stage IV (cT4, cN3, cM1) - Signed  by [name], MD on 11/04/2020     Interval history- Brittney Fisher, 59 year old female, diagnosed with metastatic lung cancer currently receiving radiation who presents to symptom management clinic for complaints of pain in the right shoulder and chest.  She says this is the ongoing pain she has had since her diagnosis.  Describes a sore and throbbing.  Pain radiates from right upper chest to right upper back.  Rates 8 of 10.  She has been taking oxycodone 5 mg every 6 hours for pain and says medication is effective at controlling her pain but wears off too quickly.  Says that pain interferes with her ability to sleep at night and makes her feel tired.  Review of systems- Review of Systems  Constitutional: Positive for malaise/fatigue. Negative for chills, fever and weight loss.  HENT: Negative for hearing loss, nosebleeds, sore throat and tinnitus.   Eyes: Negative for blurred vision and double vision.  Respiratory: Positive for shortness of breath. Negative for cough, hemoptysis and wheezing.   Cardiovascular: Positive for chest pain. Negative for palpitations and leg swelling.  Gastrointestinal: Negative for abdominal pain, blood in stool, constipation, diarrhea, melena, nausea and vomiting.  Genitourinary: Negative for dysuria and urgency.  Musculoskeletal: Positive for back pain, joint pain and myalgias. Negative for falls.  Skin: Negative for itching and rash.  Neurological: Negative for dizziness, tingling, sensory change, loss of consciousness, weakness and headaches.  Endo/Heme/Allergies: Negative for environmental allergies. Does not bruise/bleed easily.  Psychiatric/Behavioral: Negative for depression. The patient has insomnia. The patient is not nervous/anxious.      No Known Allergies  Past Medical History:  Diagnosis Date  . COPD (chronic obstructive pulmonary disease) ([place])   . GERD (gastroesophageal reflux disease)   . Metastatic lung cancer (metastasis from lung to other  site) [facility]) 11/02/2020    Past Surgical History:  Procedure Laterality Date  . COLONOSCOPY WITH ESOPHAGOGASTRODUODENOSCOPY (EGD)    . COLONOSCOPY WITH PROPOFOL N/A 11/14/2015   Procedure: COLONOSCOPY WITH PROPOFOL;  Surgeon: Hulen Luster, MD;  Location: [facility] ENDOSCOPY;  Service: Gastroenterology;  Laterality: N/A;  . ESOPHAGOGASTRODUODENOSCOPY (EGD) WITH PROPOFOL N/A 11/14/2015   Procedure: ESOPHAGOGASTRODUODENOSCOPY (EGD) WITH PROPOFOL;  Surgeon: Hulen Luster, MD;  Location: [facility] ENDOSCOPY;  Service: Gastroenterology;  Laterality: N/A;    Social History   Socioeconomic History  . Marital status: Married    Spouse name: Not on file  . Number of children: Not on file  . Years of education: Not on file  . Highest education level: Not on file  Occupational History  . Not on file  Tobacco Use  . Smoking status: Current Every Day Smoker    Packs/day: 0.20    Types: Cigarettes  . Smokeless tobacco: Never Used  Substance and Sexual Activity  . Alcohol use: Never  . Drug use: Never  . Sexual activity: Not Currently  Other Topics Concern  . Not on file  Social History Narrative  . Not on file   Social Determinants of Health   Financial Resource Strain:   . Difficulty of Paying Living Expenses: Not on file  Food Insecurity:   . Worried About Charity fundraiser in the Last Year: Not on file  . Ran Out of Food in the Last Year: Not on file  Transportation Needs:   . Lack of Transportation (Medical): Not on file  . Lack of Transportation (Non-Medical): Not on file  Physical Activity:   . Days of Exercise per Week: Not on file  . Minutes of Exercise per Session: Not on file  Stress:   . Feeling of Stress : Not on file  Social Connections:   . Frequency of Communication with Friends and Family: Not on file  . Frequency of Social Gatherings with Friends and Family: Not on file  . Attends Religious Services: Not on file  . Active Member of Clubs or Organizations: Not on file  .  Attends Archivist Meetings: Not on file  . Marital Status: Not on file  Intimate Partner Violence:   . Fear of Current or Ex-Partner: Not on file  . Emotionally Abused: Not on file  . Physically Abused: Not on file  . Sexually Abused: Not on file    Family History  Problem Relation Age of Onset  . Breast cancer Cousin 71  . Diabetes type II Mother   . Hypertension Mother   . Colon cancer Father   . Hypertension Father      Current Outpatient Medications:  .  acetaminophen (TYLENOL) 500 MG tablet, Take 1,000 mg by mouth every 8 (eight) hours as needed for moderate pain. , Disp: , Rfl:  .  benzonatate (TESSALON) 100 MG capsule, Take 1 capsule (100 mg total) by mouth 3 (three) times daily as needed for cough., Disp: 60 capsule, Rfl: 1 .  dexamethasone (DECADRON) 4 MG tablet, Take 1 tab (4 mg) twice a day the day before Alimta chemo. Take 2 tablets (8 mg) po daily x 3 days starting the day after chemo., Disp: 30 tablet, Rfl: 1 .  docusate sodium (COLACE) 100 MG capsule, Take 100  mg by mouth daily., Disp: , Rfl:  .  folic acid (FOLVITE) 1 MG tablet, Take 1 tablet (1 mg total) by mouth daily. Start 5-7 days before Alimta chemotherapy. Continue until 21 days after Alimta completed., Disp: 100 tablet, Rfl: 3 .  guaiFENesin (MUCINEX) 600 MG 12 hr tablet, Take 1 tablet (600 mg total) by mouth 2 (two) times daily., Disp: 14 tablet, Rfl: 0 .  ipratropium-albuterol (DUONEB) 0.5-2.5 (3) MG/3ML SOLN, Take 3 mLs by nebulization 3 (three) times daily., Disp: 360 mL, Rfl: 1 .  lidocaine-prilocaine (EMLA) cream, Apply to affected area once, Disp: 30 g, Rfl: 3 .  omeprazole (PRILOSEC) 20 MG capsule, Take 20 mg by mouth daily., Disp: , Rfl:  .  oxyCODONE (OXY IR/ROXICODONE) 5 MG immediate release tablet, Take 1 tablet (5 mg total) by mouth every 3 (three) hours as needed for severe pain., Disp: 60 tablet, Rfl: 0 .  tiotropium (SPIRIVA HANDIHALER) 18 MCG inhalation capsule, Place 18 mcg into  inhaler and inhale daily., Disp: , Rfl:  .  feeding supplement (ENSURE ENLIVE / ENSURE PLUS) LIQD, Take 237 mLs by mouth 2 (two) times daily between meals. (Patient not taking: Reported on 11/21/2020), Disp: 237 mL, Rfl: 12 .  ondansetron (ZOFRAN) 8 MG tablet, Take 1 tablet (8 mg total) by mouth 2 (two) times daily as needed for refractory nausea / vomiting. Start on day 3 after chemo. (Patient not taking: Reported on 11/04/2020), Disp: 30 tablet, Rfl: 1 .  prochlorperazine (COMPAZINE) 10 MG tablet, Take 1 tablet (10 mg total) by mouth every 6 (six) hours as needed (Nausea or vomiting). (Patient not taking: Reported on 11/04/2020), Disp: 30 tablet, Rfl: 1  Physical exam:  Vitals:   11/21/20 0933  BP: 121/77  Pulse: (!) 110  Resp: 18  Temp: 97.9 F (36.6 C)  TempSrc: Tympanic  SpO2: 99%   Physical Exam Constitutional:      General: She is not in acute distress.    Comments: Thin build; accompanied by niece  HENT:     Mouth/Throat:     Comments: Hoarse voice Cardiovascular:     Rate and Rhythm: Regular rhythm. Tachycardia present.     Comments: Right JVD, right neck swelling. Collateral veins swollen and visible in right chest.  Pulmonary:     Effort: No respiratory distress.  Musculoskeletal:        General: No deformity.  Skin:    General: Skin is warm and dry.  Neurological:     Mental Status: She is alert and oriented to person, place, and time.  Psychiatric:        Mood and Affect: Mood normal.        Behavior: Behavior normal.     CMP Latest Ref Rng & Units 11/18/2020  Glucose 70 - 99 mg/dL 116(H)  BUN 8 - 23 mg/dL 11  Creatinine 0.44 - 1.00 mg/dL 0.70  Sodium 135 - 145 mmol/L 132(L)  Potassium 3.5 - 5.1 mmol/L 3.8  Chloride 98 - 111 mmol/L 96(L)  CO2 22 - 32 mmol/L 27  Calcium 8.9 - 10.3 mg/dL 8.5(L)  Total Protein 6.5 - 8.1 g/dL 6.9  Total Bilirubin 0.3 - 1.2 mg/dL 0.5  Alkaline Phos 38 - 126 U/L 75  AST 15 - 41 U/L 23  ALT 0 - 44 U/L 20   CBC Latest Ref Rng  & Units 11/18/2020  WBC 4.0 - 10.5 K/uL 10.8(H)  Hemoglobin 12.0 - 15.0 g/dL 10.2(L)  Hematocrit 36 - 46 % 30.9(L)  Platelets 150 - 400 K/uL 348    No images are attached to the encounter.  [place] CHEST (PLEURAL EFFUSION)  Result Date: 11/15/2020 CLINICAL DATA:  59 year old with lung cancer and history of right pleural effusion. Evaluate pleural fluid for possible thoracentesis. Right thoracentesis was performed on 10/31/2020. EXAM: CHEST ULTRASOUND COMPARISON:  10/31/2020 FINDINGS: Small amount of loculated pleural fluid identified in the right lower lateral chest. No significant left pleural fluid. IMPRESSION: Small amount of loculated right pleural fluid. Therapeutic thoracentesis was not performed due to the small amount of pleural fluid. Electronically Signed   By: Markus Daft M.D.   On: 11/15/2020 14:43   NM PET Image Initial (PI) Skull Base To Thigh  Result Date: 10/28/2020 CLINICAL DATA:  Initial treatment strategy for right upper lobe primary bronchogenic carcinoma. Malignant pleural effusion. EXAM: NUCLEAR MEDICINE PET SKULL BASE TO THIGH TECHNIQUE: 8.5 mCi F-18 FDG was injected intravenously. Full-ring PET imaging was performed from the skull base to thigh after the radiotracer. CT data was obtained and used for attenuation correction and anatomic localization. Fasting blood glucose: 91 mg/dl COMPARISON:  Chest CT 10/18/2020 FINDINGS: Mediastinal blood pool activity: SUV max 1.9 Liver activity: SUV max Not applicable. NECK: Hypermetabolic low right jugular/supraclavicular node measures 1.5 cm and a S.U.V. max of 7.0 on 59/3. CT findings: none CHEST: Central right upper lobe lung mass with direct mediastinal invasion. This measured 7.1 x 6.4 cm on prior diagnostic CT and a S.U.V. max of 9.6 on 86/3 today. Extensive right-sided pleural metastasis. The index implant on the prior CT corresponds to hypermetabolism, including at a S.U.V. max of 7.4 on 130/3 today. Left suprahilar node measures 1.9 cm  and a S.U.V. max of 8.6 on 87/3. Left infrahilar adenopathy at 1.6 cm and a S.U.V. max of 8.1 on 107/3. Extensive hypermetabolic pulmonary metastasis. Example in the left lower lobe at 1.7 cm and a S.U.V. max of 4.4 on 117/3. Hypermetabolism along the course of the right axillary vein, including at a S.U.V. max of 5.5 on 65/3. The vein is subtly hyperattenuating, and followed centrally to the level of SVC narrowing, including on 73/3. Incidental CT findings: Deferred to recent diagnostic CT. Moderate right pleural effusion is not significantly changed. Similar small pericardial effusion. ABDOMEN/PELVIS: A node adjacent to the right adrenal measures 5 mm and a S.U.V. max of 4.8 on 151/3. No abdominopelvic parenchymal hypermetabolism identified. Incidental CT findings: Abdominal aortic atherosclerosis. Bilateral adrenal thickening suggesting hyperplasia. Large colonic stool burden. SKELETON: Multifocal osseous metastasis. Index left posterior iliac lesion measures 8 mm and a S.U.V. max of 8.8 on 203/3. T2 lytic lesion is eccentric left at 11 mm and a S.U.V. max of 10.1 on 60/3. Incidental CT findings: none IMPRESSION: 1. Right upper lobe primary bronchogenic carcinoma with direct mediastinal invasion. 2. Metastatic disease to right pleural space, lungs, bones, nodes of the chest and less so lower neck/upper abdomen. 3. Hypermetabolism along the course of the right axillary vein with concurrent subtle hyperattenuation within the axillary vein and SVC. This continues to the level of SVC narrowing, suspicious for SVC occlusion and developing thrombus. Recommend clinical correlation for SVC syndrome. 4. Moderate right pleural effusion and small pericardial effusion, similar. Electronically Signed   By: Abigail Miyamoto M.D.   On: 10/28/2020 09:55   DG Chest Port 1 View  Result Date: 10/31/2020 CLINICAL DATA:  Right thoracentesis EXAM: PORTABLE CHEST 1 VIEW COMPARISON:  10/20/2020 FINDINGS: Right hilar mass with upper  lobe collapse/replacement. Multiple pulmonary nodules. Small right  pleural effusion in the frontal projection. No evidence of pneumothorax. Stable heart size. IMPRESSION: Stable from 10/20/2020.  No complicating features of thoracentesis. Electronically Signed   By: Monte Fantasia M.D.   On: 10/31/2020 09:14   [place] CORE BIOPSY (LYMPH NODES)  Result Date: 10/31/2020 INDICATION: Lung carcinoma.  Hypermetabolic right supraclavicular adenopathy. EXAM: ULTRASOUND GUIDED CORE BIOPSY OF RIGHT SUPRACLAVICULAR ADENOPATHY MEDICATIONS: No antibiotics indicated ANESTHESIA/SEDATION: Lidocaine 1% subcutaneous PROCEDURE: The procedure, risks, benefits, and alternatives were explained to the patient. Questions regarding the procedure were encouraged and answered. The patient understands and consents to the procedure. Survey ultrasound of the right supraclavicular region was performed. The adenopathy was localized and an appropriate skin entry site was determined and marked. Thrombosis of the right IJ vein incidentally noted. The operative field was prepped with chlorhexidine in a sterile fashion, and a sterile drape was applied covering the operative field. A sterile gown and sterile gloves were used for the procedure. Local anesthesia was provided with 1% Lidocaine. Under real-time ultrasound guidance, multiple 18-gauge core biopsy samples were obtained, submitted in saline on [place] to surgical pathology. The guide needle was removed. Postprocedure scans show no hemorrhage or other apparent complication. The patient tolerated the procedure well. COMPLICATIONS: None immediate. FINDINGS: Right supraclavicular adenopathy was localized. Representative core biopsy samples obtained as above. Thrombosis of the right IJ vein incidentally noted. IMPRESSION: 1. Technically successful ultrasound-guided core biopsy, right supraclavicular adenopathy. 2. Right IJ venous thrombosis incidentally noted. Electronically Signed   By: Lucrezia Europe  M.D.   On: 10/31/2020 11:01   US THORACENTESIS ASP PLEURAL SPACE W/IMG GUIDE  Result Date: 10/31/2020 INDICATION: Right lung carcinoma. Pleural metastases and recurrent symptomatic effusion EXAM: ULTRASOUND GUIDED RIGHT THORACENTESIS MEDICATIONS: Lidocaine 1% subcutaneous COMPLICATIONS: None immediate. Postprocedure chest radiograph shows no pneumothorax. PROCEDURE: An ultrasound guided thoracentesis was thoroughly discussed with the patient and questions answered. The benefits, risks, alternatives and complications were also discussed. The patient understands and wishes to proceed with the procedure. Written consent was obtained. Ultrasound was performed to localize and mark an adequate pocket of fluid in the right chest. The area was then prepped and draped in the normal sterile fashion. 1% Lidocaine was used for local anesthesia. Under ultrasound guidance a 6 Fr Safe-T-Centesis catheter was introduced. Thoracentesis was performed. The catheter was removed and a dressing applied. FINDINGS: A total of approximately 2.3 L of red tinged fluid was removed. Samples were sent to the laboratory as requested by the clinical team. IMPRESSION: Successful ultrasound guided right thoracentesis yielding 2.3 L of pleural fluid. Electronically Signed   By: Lucrezia Europe M.D.   On: 10/31/2020 11:02    Assessment and plan- Patient is a 59 y.o. female diagnosed with metastatic lung cancer who presents to symptom management clinic for pain secondary to malignancy.  1.  Pain secondary to malignancy-unrelieved by current regimen.  Increase frequency of oxycodone to every 3 hours.  Has medication at home so I will send a refill today. 2.  SVC-currently receiving palliative radiation.  Collateral veins swollen and easily visible on exam today.  Patient followed by vascular surgery 3.  Tachycardia-encouraged oral fluids and pain control.  Return to clinic if symptoms do not improve or worsen in the interim.   Visit Diagnosis  1. Cancer associated pain     Patient expressed understanding and was in agreement with this plan. She also understands that She can call clinic at any time with any questions, concerns, or complaints.   Thank you for allowing  me to participate in the care of this very pleasant patient.   Beckey Rutter, DNP, AGNP-C [facility]  CC: Dr. [name]

## 2020-11-22 ENCOUNTER — Encounter: Payer: Self-pay | Admitting: Oncology

## 2020-11-22 ENCOUNTER — Telehealth: Payer: Self-pay

## 2020-11-22 ENCOUNTER — Ambulatory Visit
Admission: RE | Admit: 2020-11-22 | Discharge: 2020-11-22 | Disposition: A | Discharge status: Home / Self Care | Payer: BC Managed Care – PPO | Source: Ambulatory Visit | Attending: Radiation Oncology | Admitting: Radiation Oncology

## 2020-11-22 ENCOUNTER — Ambulatory Visit (INDEPENDENT_AMBULATORY_CARE_PROVIDER_SITE_OTHER): Payer: Self-pay | Admitting: Vascular Surgery

## 2020-11-22 DIAGNOSIS — Z510 Encounter for antineoplastic radiation therapy: Secondary | ICD-10-CM | POA: Diagnosis not present

## 2020-11-22 NOTE — Telephone Encounter (Signed)
T/C to pt for follow up after receiving first chemo.   No answer but unable to leave message due to "mail box is full".

## 2020-11-23 ENCOUNTER — Ambulatory Visit
Admission: RE | Admit: 2020-11-23 | Discharge: 2020-11-23 | Disposition: A | Discharge status: Home / Self Care | Payer: BC Managed Care – PPO | Source: Ambulatory Visit | Attending: Radiation Oncology | Admitting: Radiation Oncology

## 2020-11-23 DIAGNOSIS — Z510 Encounter for antineoplastic radiation therapy: Secondary | ICD-10-CM | POA: Diagnosis not present

## 2020-11-24 ENCOUNTER — Ambulatory Visit
Admission: RE | Admit: 2020-11-24 | Discharge: 2020-11-24 | Disposition: A | Discharge status: Home / Self Care | Payer: BC Managed Care – PPO | Source: Ambulatory Visit | Attending: Radiation Oncology | Admitting: Radiation Oncology

## 2020-11-24 DIAGNOSIS — Z510 Encounter for antineoplastic radiation therapy: Secondary | ICD-10-CM | POA: Diagnosis not present

## 2020-11-25 ENCOUNTER — Inpatient Hospital Stay (HOSPITAL_BASED_OUTPATIENT_CLINIC_OR_DEPARTMENT_OTHER): Payer: BC Managed Care – PPO | Admitting: Oncology

## 2020-11-25 ENCOUNTER — Encounter: Payer: Self-pay | Admitting: Oncology

## 2020-11-25 ENCOUNTER — Inpatient Hospital Stay: Payer: BC Managed Care – PPO

## 2020-11-25 ENCOUNTER — Ambulatory Visit
Admission: RE | Admit: 2020-11-25 | Discharge: 2020-11-25 | Disposition: A | Discharge status: Home / Self Care | Payer: BC Managed Care – PPO | Source: Ambulatory Visit | Attending: Radiation Oncology | Admitting: Radiation Oncology

## 2020-11-25 VITALS — BP 125/71 | HR 103 | Resp 18

## 2020-11-25 VITALS — BP 115/74 | HR 111 | Temp 97.7°F | Resp 18 | Wt 133.8 lb

## 2020-11-25 DIAGNOSIS — C3490 Malignant neoplasm of unspecified part of unspecified bronchus or lung: Secondary | ICD-10-CM

## 2020-11-25 DIAGNOSIS — I871 Compression of vein: Secondary | ICD-10-CM

## 2020-11-25 DIAGNOSIS — Z5111 Encounter for antineoplastic chemotherapy: Secondary | ICD-10-CM | POA: Insufficient documentation

## 2020-11-25 DIAGNOSIS — G893 Neoplasm related pain (acute) (chronic): Secondary | ICD-10-CM | POA: Insufficient documentation

## 2020-11-25 DIAGNOSIS — R000 Tachycardia, unspecified: Secondary | ICD-10-CM | POA: Insufficient documentation

## 2020-11-25 DIAGNOSIS — J90 Pleural effusion, not elsewhere classified: Secondary | ICD-10-CM | POA: Diagnosis not present

## 2020-11-25 DIAGNOSIS — D649 Anemia, unspecified: Secondary | ICD-10-CM

## 2020-11-25 DIAGNOSIS — R634 Abnormal weight loss: Secondary | ICD-10-CM

## 2020-11-25 DIAGNOSIS — Z510 Encounter for antineoplastic radiation therapy: Secondary | ICD-10-CM | POA: Diagnosis not present

## 2020-11-25 LAB — CBC WITH DIFFERENTIAL/PLATELET
Abs Immature Granulocytes: 0.03 10*3/uL (ref 0.00–0.07)
Basophils Absolute: 0 10*3/uL (ref 0.0–0.1)
Basophils Relative: 0 %
Eosinophils Absolute: 0 10*3/uL (ref 0.0–0.5)
Eosinophils Relative: 1 %
HCT: 33.1 % — ABNORMAL LOW (ref 36.0–46.0)
Hemoglobin: 10.9 g/dL — ABNORMAL LOW (ref 12.0–15.0)
Immature Granulocytes: 1 %
Lymphocytes Relative: 16 %
Lymphs Abs: 0.8 10*3/uL (ref 0.7–4.0)
MCH: 25.8 pg — ABNORMAL LOW (ref 26.0–34.0)
MCHC: 32.9 g/dL (ref 30.0–36.0)
MCV: 78.4 fL — ABNORMAL LOW (ref 80.0–100.0)
Monocytes Absolute: 0.6 10*3/uL (ref 0.1–1.0)
Monocytes Relative: 12 %
Neutro Abs: 3.3 10*3/uL (ref 1.7–7.7)
Neutrophils Relative %: 70 %
Platelets: 197 10*3/uL (ref 150–400)
RBC: 4.22 MIL/uL (ref 3.87–5.11)
RDW: 14 % (ref 11.5–15.5)
WBC: 4.7 10*3/uL (ref 4.0–10.5)
nRBC: 0 % (ref 0.0–0.2)

## 2020-11-25 LAB — COMPREHENSIVE METABOLIC PANEL
ALT: 36 U/L (ref 0–44)
AST: 35 U/L (ref 15–41)
Albumin: 3 g/dL — ABNORMAL LOW (ref 3.5–5.0)
Alkaline Phosphatase: 93 U/L (ref 38–126)
Anion gap: 9 (ref 5–15)
BUN: 16 mg/dL (ref 8–23)
CO2: 30 mmol/L (ref 22–32)
Calcium: 8.7 mg/dL — ABNORMAL LOW (ref 8.9–10.3)
Chloride: 94 mmol/L — ABNORMAL LOW (ref 98–111)
Creatinine, Ser: 0.7 mg/dL (ref 0.44–1.00)
GFR, Estimated: >60 mL/min (ref >60)
Glucose, Bld: 111 mg/dL — ABNORMAL HIGH (ref 70–99)
Potassium: 3.8 mmol/L (ref 3.5–5.1)
Sodium: 133 mmol/L — ABNORMAL LOW (ref 135–145)
Total Bilirubin: 0.2 mg/dL — ABNORMAL LOW (ref 0.3–1.2)
Total Protein: 7.2 g/dL (ref 6.5–8.1)

## 2020-11-25 LAB — IRON AND TIBC
Iron: 45 ug/dL (ref 28–170)
Saturation Ratios: 21 % (ref 10.4–31.8)
TIBC: 211 ug/dL — ABNORMAL LOW (ref 250–450)
UIBC: 166 ug/dL

## 2020-11-25 LAB — RETIC PANEL
Immature Retic Fract: 12.1 % (ref 2.3–15.9)
RBC.: 4.24 MIL/uL (ref 3.87–5.11)
Retic Count, Absolute: 16.5 10*3/uL — ABNORMAL LOW (ref 19.0–186.0)
Retic Ct Pct: <0.4 % — ABNORMAL LOW (ref 0.4–3.1)
Reticulocyte Hemoglobin: 33.7 pg (ref >27.9)

## 2020-11-25 LAB — FERRITIN: Ferritin: 1459 ng/mL — ABNORMAL HIGH (ref 11–307)

## 2020-11-25 MED ORDER — SODIUM CHLORIDE 0.9 % IV SOLN
Freq: Once | INTRAVENOUS | Status: AC
  Filled 2020-11-25: qty 250

## 2020-11-25 MED ORDER — SENNOSIDES-DOCUSATE SODIUM 8.6-50 MG PO TABS: 2.0000 | ORAL_TABLET | Freq: Every day | ORAL | 3 refills | Status: DC

## 2020-11-25 MED ORDER — OMEPRAZOLE 20 MG PO CPDR
20.0000 mg | DELAYED_RELEASE_CAPSULE | Freq: Two times a day (BID) | ORAL | 1 refills | Status: DC
Start: 2020-11-25 — End: 2021-10-20

## 2020-11-25 NOTE — Progress Notes (Signed)
Received 1 liter of NS. Tolerated well. No complaints at time of discharge. Accompanied by niece. Ambulatory.

## 2020-11-25 NOTE — Progress Notes (Signed)
Pt here for follow up. No new concerns voiced.   

## 2020-11-25 NOTE — Progress Notes (Signed)
1q    Hematology/Oncology Follow Up Note [facility]  [phone] [phone]  Patient Care Team: Ranae Plumber, [place] as PCP - General (Family Medicine) Telford Nab, RN as Oncology Nurse Navigator   Name of the patient: Brittney Fisher  [id number]  1961/10/18   REASON FOR VISIT  follow-up for lung mass pleural effusion  PERTINENT ONCOLOGY HISTORY 59 y.o. female with past medical history including GERD, COPD, current everyday smoker presents for follow-up of lung mass and pleural effusion. 10/20/2020, patient was brought to ED via EMS due to generalized weakness, dizziness, chest pain shortness of breath.  She was recently diagnosed with COPD approximately 1 month ago by primary care provider.  Also unintentional weight loss during the last few months. Image work-up showed right-sided pleural effusion.  She underwent right thoracentesis.  With removal of 2.4 L of fluid. 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  Metastatic disease to low cervical/thoracic nodes, lung, right pleural space and bones.  Moderate to large right pleural effusion.  SVC narrowing. 10/21/2020 MRI brain is negative for metastasis.  Mild chronic microvascular ischemic changes in the white matter.  Negative for acute infarct Regarding to the SVC narrowing, patient was seen by vascular surgeon and was recommended no intervention inpatient.  Patient to follow-up outpatient with vascular surgeon for evaluation. Patient also was treated for COPD exacerbation with hypoxia on exertion. Qualifies for home oxygen and hospitalist arrange home health and a nebulizer.  Patient was given a course of prednisone taper and empiric Levaquin. Patient was discharged and present today to follow-up with cytology results and further management plan  10/27/2020 PET scan showed right upper lobe primary bronchogenic  mass with direct invasion into mediastinum. Metastatic disease to the right pleural space, lung, bone, nodes of the chest and less so lower leg/upper abdomen. Hypermetabolic him along the course of the right axillary vein with concurrent subtle hyper attenuation within the axillary vein and SVC.  This continues to the level of SVC narrowing.  Suspicious for SVC occlusion and developing thrombus. Moderate right pleural effusion and small pericardial effusion.  # # left supraclavicular mass biopsy- pathology is positive for adenocarcinoma. positive for CK7, with patchy, weak CK20. They are  negative for TTF1, NapsinA, GATA3, CDX2, and Pax8. The findings are  nonspecific; but may be compatible with a poorly differentiated  adenocarcinoma of lung origin, especially given the imaging findings  She also had another repeat thoracentesis and fluid cytology showed malignancy.  #NGS showed AXIN 1 [id number], DIS2 M154f, KRAS G12C, PRDM1 M1 [id number], [id number] MS stable, TPS <1%   tarted palliative RT since 11/14/2020. INTERVAL HISTORY Brittney CASANASis a 59y.o. female who has above history reviewed by me today presents for follow up visit for management of metastatic lung cancer.  Problems and complaints are listed below: Increased acid reflux symptoms. She tolerates first cycle of chemotherapy well last week.  No nausea vomiting.  She is constipated and she takes Colace 100 mg daily.  Symptoms were not relieved. Denies any pain today.  Pain has been controlled well with current regimen.  Shortness of breath is stable.    Review of Systems  Constitutional: Positive for fatigue. Negative for appetite change, chills, fever and unexpected weight change.  HENT:   Negative for hearing loss and voice change.   Eyes: Negative for eye problems.  Respiratory: Negative for chest tightness, cough and shortness of breath.  Cardiovascular: Negative for chest pain.  Gastrointestinal: Negative for  abdominal distention, abdominal pain and blood in stool.  Endocrine: Negative for hot flashes.  Genitourinary: Negative for difficulty urinating and frequency.   Musculoskeletal: Negative for arthralgias.       Right side chest wall pain has improved.   Skin: Negative for itching and rash.  Neurological: Negative for extremity weakness.  Hematological: Negative for adenopathy.  Psychiatric/Behavioral: Negative for confusion.      No Known Allergies   Past Medical History:  Diagnosis Date  . COPD (chronic obstructive pulmonary disease) ([place])   . GERD (gastroesophageal reflux disease)   . Metastatic lung cancer (metastasis from lung to other site) [facility]) 11/02/2020     Past Surgical History:  Procedure Laterality Date  . COLONOSCOPY WITH ESOPHAGOGASTRODUODENOSCOPY (EGD)    . COLONOSCOPY WITH PROPOFOL N/A 11/14/2015   Procedure: COLONOSCOPY WITH PROPOFOL;  Surgeon: Hulen Luster, MD;  Location: [facility] ENDOSCOPY;  Service: Gastroenterology;  Laterality: N/A;  . ESOPHAGOGASTRODUODENOSCOPY (EGD) WITH PROPOFOL N/A 11/14/2015   Procedure: ESOPHAGOGASTRODUODENOSCOPY (EGD) WITH PROPOFOL;  Surgeon: Hulen Luster, MD;  Location: [facility] ENDOSCOPY;  Service: Gastroenterology;  Laterality: N/A;    Social History   Socioeconomic History  . Marital status: Married    Spouse name: Not on file  . Number of children: Not on file  . Years of education: Not on file  . Highest education level: Not on file  Occupational History  . Not on file  Tobacco Use  . Smoking status: Current Every Day Smoker    Packs/day: 0.20    Types: Cigarettes  . Smokeless tobacco: Never Used  Substance and Sexual Activity  . Alcohol use: Never  . Drug use: Never  . Sexual activity: Not Currently  Other Topics Concern  . Not on file  Social History Narrative  . Not on file   Social Determinants of Health   Financial Resource Strain: Not on file  Food Insecurity: Not on file  Transportation Needs: Not on file   Physical Activity: Not on file  Stress: Not on file  Social Connections: Not on file  Intimate Partner Violence: Not on file    Family History  Problem Relation Age of Onset  . Breast cancer Cousin 86  . Diabetes type II Mother   . Hypertension Mother   . Colon cancer Father   . Hypertension Father      Current Outpatient Medications:  .  acetaminophen (TYLENOL) 500 MG tablet, Take 1,000 mg by mouth every 8 (eight) hours as needed for moderate pain. , Disp: , Rfl:  .  benzonatate (TESSALON) 100 MG capsule, Take 1 capsule (100 mg total) by mouth 3 (three) times daily as needed for cough., Disp: 60 capsule, Rfl: 1 .  dexamethasone (DECADRON) 4 MG tablet, Take 1 tab (4 mg) twice a day the day before Alimta chemo. Take 2 tablets (8 mg) po daily x 3 days starting the day after chemo., Disp: 30 tablet, Rfl: 1 .  feeding supplement (ENSURE ENLIVE / ENSURE PLUS) LIQD, Take 237 mLs by mouth 2 (two) times daily between meals., Disp: 237 mL, Rfl: 12 .  folic acid (FOLVITE) 1 MG tablet, Take 1 tablet (1 mg total) by mouth daily. Start 5-7 days before Alimta chemotherapy. Continue until 21 days after Alimta completed., Disp: 100 tablet, Rfl: 3 .  guaiFENesin (MUCINEX) 600 MG 12 hr tablet, Take 1 tablet (600 mg total) by mouth 2 (two) times daily., Disp: 14 tablet, Rfl: 0 .  ipratropium-albuterol (DUONEB) 0.5-2.5 (3) MG/3ML SOLN, Take 3 mLs by nebulization 3 (three) times daily., Disp: 360 mL, Rfl: 1 .  ondansetron (ZOFRAN) 8 MG tablet, Take 1 tablet (8 mg total) by mouth 2 (two) times daily as needed for refractory nausea / vomiting. Start on day 3 after chemo., Disp: 30 tablet, Rfl: 1 .  oxyCODONE (OXY IR/ROXICODONE) 5 MG immediate release tablet, Take 1 tablet (5 mg total) by mouth every 3 (three) hours as needed for severe pain., Disp: 60 tablet, Rfl: 0 .  prochlorperazine (COMPAZINE) 10 MG tablet, Take 1 tablet (10 mg total) by mouth every 6 (six) hours as needed (Nausea or vomiting)., Disp: 30  tablet, Rfl: 1 .  tiotropium (SPIRIVA HANDIHALER) 18 MCG inhalation capsule, Place 18 mcg into inhaler and inhale daily., Disp: , Rfl:  .  lidocaine-prilocaine (EMLA) cream, Apply to affected area once (Patient not taking: Reported on 11/25/2020), Disp: 30 g, Rfl: 3 .  omeprazole (PRILOSEC) 20 MG capsule, Take 1 capsule (20 mg total) by mouth 2 (two) times daily before a meal., Disp: 60 capsule, Rfl: 1 .  senna-docusate (SENOKOT-S) 8.6-50 MG tablet, Take 2 tablets by mouth daily., Disp: 60 tablet, Rfl: 3  Physical exam:  Vitals:   11/25/20 0930  BP: 115/74  Pulse: (!) 111  Resp: 18  Temp: 97.7 F (36.5 C)  Weight: 133 lb 12.8 oz (60.7 kg)   Physical Exam Constitutional:      General: She is not in acute distress.    Comments: Thin built female walks independently  HENT:     Head: Normocephalic and atraumatic.  Eyes:     General: No scleral icterus. Cardiovascular:     Rate and Rhythm: Regular rhythm. Tachycardia present.     Heart sounds: Normal heart sounds.  Pulmonary:     Effort: Pulmonary effort is normal. No respiratory distress.     Breath sounds: No wheezing.     Comments: Decreased breath sounds in right lower lung Abdominal:     General: Bowel sounds are normal. There is no distension.     Palpations: Abdomen is soft.  Musculoskeletal:        General: No deformity. Normal range of motion.     Cervical back: Normal range of motion and neck supple.  Skin:    General: Skin is warm and dry.     Findings: No erythema or rash.  Neurological:     Mental Status: She is alert and oriented to person, place, and time. Mental status is at baseline.     Cranial Nerves: No cranial nerve deficit.     Coordination: Coordination normal.  Psychiatric:        Mood and Affect: Mood normal.     CMP Latest Ref Rng & Units 11/25/2020  Glucose 70 - 99 mg/dL 111(H)  BUN 8 - 23 mg/dL 16  Creatinine 0.44 - 1.00 mg/dL 0.70  Sodium 135 - 145 mmol/L 133(L)  Potassium 3.5 - 5.1 mmol/L  3.8  Chloride 98 - 111 mmol/L 94(L)  CO2 22 - 32 mmol/L 30  Calcium 8.9 - 10.3 mg/dL 8.7(L)  Total Protein 6.5 - 8.1 g/dL 7.2  Total Bilirubin 0.3 - 1.2 mg/dL 0.2(L)  Alkaline Phos 38 - 126 U/L 93  AST 15 - 41 U/L 35  ALT 0 - 44 U/L 36   CBC Latest Ref Rng & Units 11/25/2020  WBC 4.0 - 10.5 K/uL 4.7  Hemoglobin 12.0 - 15.0 g/dL 10.9(L)  Hematocrit 36.0 - 46.0 %  33.1(L)  Platelets 150 - 400 K/uL 197    RADIOGRAPHIC STUDIES: I have personally reviewed the radiological images as listed and agreed with the findings in the report. [place] CHEST (PLEURAL EFFUSION)  Result Date: 11/15/2020 CLINICAL DATA:  59 year old with lung cancer and history of right pleural effusion. Evaluate pleural fluid for possible thoracentesis. Right thoracentesis was performed on 10/31/2020. EXAM: CHEST ULTRASOUND COMPARISON:  10/31/2020 FINDINGS: Small amount of loculated pleural fluid identified in the right lower lateral chest. No significant left pleural fluid. IMPRESSION: Small amount of loculated right pleural fluid. Therapeutic thoracentesis was not performed due to the small amount of pleural fluid. Electronically Signed   By: Markus Daft M.D.   On: 11/15/2020 14:43   NM PET Image Initial (PI) Skull Base To Thigh  Result Date: 10/28/2020 CLINICAL DATA:  Initial treatment strategy for right upper lobe primary bronchogenic carcinoma. Malignant pleural effusion. EXAM: NUCLEAR MEDICINE PET SKULL BASE TO THIGH TECHNIQUE: 8.5 mCi F-18 FDG was injected intravenously. Full-ring PET imaging was performed from the skull base to thigh after the radiotracer. CT data was obtained and used for attenuation correction and anatomic localization. Fasting blood glucose: 91 mg/dl COMPARISON:  Chest CT 10/18/2020 FINDINGS: Mediastinal blood pool activity: SUV max 1.9 Liver activity: SUV max Not applicable. NECK: Hypermetabolic low right jugular/supraclavicular node measures 1.5 cm and a S.U.V. max of 7.0 on 59/3. CT findings: none  CHEST: Central right upper lobe lung mass with direct mediastinal invasion. This measured 7.1 x 6.4 cm on prior diagnostic CT and a S.U.V. max of 9.6 on 86/3 today. Extensive right-sided pleural metastasis. The index implant on the prior CT corresponds to hypermetabolism, including at a S.U.V. max of 7.4 on 130/3 today. Left suprahilar node measures 1.9 cm and a S.U.V. max of 8.6 on 87/3. Left infrahilar adenopathy at 1.6 cm and a S.U.V. max of 8.1 on 107/3. Extensive hypermetabolic pulmonary metastasis. Example in the left lower lobe at 1.7 cm and a S.U.V. max of 4.4 on 117/3. Hypermetabolism along the course of the right axillary vein, including at a S.U.V. max of 5.5 on 65/3. The vein is subtly hyperattenuating, and followed centrally to the level of SVC narrowing, including on 73/3. Incidental CT findings: Deferred to recent diagnostic CT. Moderate right pleural effusion is not significantly changed. Similar small pericardial effusion. ABDOMEN/PELVIS: A node adjacent to the right adrenal measures 5 mm and a S.U.V. max of 4.8 on 151/3. No abdominopelvic parenchymal hypermetabolism identified. Incidental CT findings: Abdominal aortic atherosclerosis. Bilateral adrenal thickening suggesting hyperplasia. Large colonic stool burden. SKELETON: Multifocal osseous metastasis. Index left posterior iliac lesion measures 8 mm and a S.U.V. max of 8.8 on 203/3. T2 lytic lesion is eccentric left at 11 mm and a S.U.V. max of 10.1 on 60/3. Incidental CT findings: none IMPRESSION: 1. Right upper lobe primary bronchogenic carcinoma with direct mediastinal invasion. 2. Metastatic disease to right pleural space, lungs, bones, nodes of the chest and less so lower neck/upper abdomen. 3. Hypermetabolism along the course of the right axillary vein with concurrent subtle hyperattenuation within the axillary vein and SVC. This continues to the level of SVC narrowing, suspicious for SVC occlusion and developing thrombus. Recommend  clinical correlation for SVC syndrome. 4. Moderate right pleural effusion and small pericardial effusion, similar. Electronically Signed   By: Abigail Miyamoto M.D.   On: 10/28/2020 09:55   DG Chest Port 1 View  Result Date: 10/31/2020 CLINICAL DATA:  Right thoracentesis EXAM: PORTABLE CHEST 1 VIEW COMPARISON:  10/20/2020  FINDINGS: Right hilar mass with upper lobe collapse/replacement. Multiple pulmonary nodules. Small right pleural effusion in the frontal projection. No evidence of pneumothorax. Stable heart size. IMPRESSION: Stable from 10/20/2020.  No complicating features of thoracentesis. Electronically Signed   By: Monte Fantasia M.D.   On: 10/31/2020 09:14   [place] CORE BIOPSY (LYMPH NODES)  Result Date: 10/31/2020 INDICATION: Lung carcinoma.  Hypermetabolic right supraclavicular adenopathy. EXAM: ULTRASOUND GUIDED CORE BIOPSY OF RIGHT SUPRACLAVICULAR ADENOPATHY MEDICATIONS: No antibiotics indicated ANESTHESIA/SEDATION: Lidocaine 1% subcutaneous PROCEDURE: The procedure, risks, benefits, and alternatives were explained to the patient. Questions regarding the procedure were encouraged and answered. The patient understands and consents to the procedure. Survey ultrasound of the right supraclavicular region was performed. The adenopathy was localized and an appropriate skin entry site was determined and marked. Thrombosis of the right IJ vein incidentally noted. The operative field was prepped with chlorhexidine in a sterile fashion, and a sterile drape was applied covering the operative field. A sterile gown and sterile gloves were used for the procedure. Local anesthesia was provided with 1% Lidocaine. Under real-time ultrasound guidance, multiple 18-gauge core biopsy samples were obtained, submitted in saline on Telfa to surgical pathology. The guide needle was removed. Postprocedure scans show no hemorrhage or other apparent complication. The patient tolerated the procedure well. COMPLICATIONS: None  immediate. FINDINGS: Right supraclavicular adenopathy was localized. Representative core biopsy samples obtained as above. Thrombosis of the right IJ vein incidentally noted. IMPRESSION: 1. Technically successful ultrasound-guided core biopsy, right supraclavicular adenopathy. 2. Right IJ venous thrombosis incidentally noted. Electronically Signed   By: Lucrezia Europe M.D.   On: 10/31/2020 11:01   US THORACENTESIS ASP PLEURAL SPACE W/IMG GUIDE  Result Date: 10/31/2020 INDICATION: Right lung carcinoma. Pleural metastases and recurrent symptomatic effusion EXAM: ULTRASOUND GUIDED RIGHT THORACENTESIS MEDICATIONS: Lidocaine 1% subcutaneous COMPLICATIONS: None immediate. Postprocedure chest radiograph shows no pneumothorax. PROCEDURE: An ultrasound guided thoracentesis was thoroughly discussed with the patient and questions answered. The benefits, risks, alternatives and complications were also discussed. The patient understands and wishes to proceed with the procedure. Written consent was obtained. Ultrasound was performed to localize and mark an adequate pocket of fluid in the right chest. The area was then prepped and draped in the normal sterile fashion. 1% Lidocaine was used for local anesthesia. Under ultrasound guidance a 6 Fr Safe-T-Centesis catheter was introduced. Thoracentesis was performed. The catheter was removed and a dressing applied. FINDINGS: A total of approximately 2.3 L of red tinged fluid was removed. Samples were sent to the laboratory as requested by the clinical team. IMPRESSION: Successful ultrasound guided right thoracentesis yielding 2.3 L of pleural fluid. Electronically Signed   By: Lucrezia Europe M.D.   On: 10/31/2020 11:02     Assessment and plan  1. Primary malignant neoplasm of lung metastatic to other site, unspecified laterality ([place])   2. Pleural effusion   3. Weight loss   4. Encounter for antineoplastic chemotherapy   5. Neoplasm related pain   6. Tachycardia   7. Anemia,  unspecified type   8. SVC obstruction    Cancer Staging Primary malignant neoplasm of lung metastatic to other site 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  cT4 N3 M1-  KRASG12C (Lumakras in subsequent line can be considered) Status post 1st line palliative chemotherapy cycle 1 carboplatin and Alimta. Labs are reviewed and  discussed with patient Counts are stable. She tolerates treatment well. Continue folic acid supplementation. Immunotherapy is on hold due to palliative radiation. Continue follow-up with radiation  #Bronchial obstruction and SVC occlusion.  Continue palliative radiation She has follow-up appointment with vascular surgeon Dr. Lucky Cowboy for further management and Dr. Lucky Cowboy will determine if she is a candidate for Mediport placement.  #Neoplasm related pain, continue oxycodone 5 mg every 6 hours as needed.  Pain is controlled. #Unintentional weight loss, refer to nutritionist.  Recommend nutrition supplementation. #Tachycardic, heart rate was initially 114.  Patient will receive 1 L of IV fluid normal saline encourage oral hydration #Anemia, likely secondary to chemotherapy radiation and underlying malignancy.  #Right pleural effusion, stable.  Follow-up on 2 weeks   [name], MD, PhD Hematology Oncology [facility] at [facility] Pager- [phone] 11/25/2020

## 2020-11-28 ENCOUNTER — Ambulatory Visit
Admission: RE | Admit: 2020-11-28 | Discharge: 2020-11-28 | Disposition: A | Discharge status: Home / Self Care | Payer: BC Managed Care – PPO | Source: Ambulatory Visit | Attending: Radiation Oncology | Admitting: Radiation Oncology

## 2020-11-28 DIAGNOSIS — Z510 Encounter for antineoplastic radiation therapy: Secondary | ICD-10-CM | POA: Diagnosis not present

## 2020-11-29 ENCOUNTER — Ambulatory Visit
Admission: RE | Admit: 2020-11-29 | Discharge: 2020-11-29 | Disposition: A | Discharge status: Home / Self Care | Payer: BC Managed Care – PPO | Source: Ambulatory Visit | Attending: Radiation Oncology | Admitting: Radiation Oncology

## 2020-11-29 DIAGNOSIS — Z510 Encounter for antineoplastic radiation therapy: Secondary | ICD-10-CM | POA: Diagnosis not present

## 2020-11-30 ENCOUNTER — Ambulatory Visit
Admission: RE | Admit: 2020-11-30 | Discharge: 2020-11-30 | Disposition: A | Discharge status: Home / Self Care | Payer: BC Managed Care – PPO | Source: Ambulatory Visit | Attending: Radiation Oncology | Admitting: Radiation Oncology

## 2020-11-30 DIAGNOSIS — Z510 Encounter for antineoplastic radiation therapy: Secondary | ICD-10-CM | POA: Diagnosis not present

## 2020-12-01 ENCOUNTER — Encounter: Payer: Self-pay | Admitting: Oncology

## 2020-12-01 ENCOUNTER — Other Ambulatory Visit: Payer: Self-pay

## 2020-12-01 ENCOUNTER — Ambulatory Visit
Admission: RE | Admit: 2020-12-01 | Discharge: 2020-12-01 | Disposition: A | Discharge status: Home / Self Care | Payer: BC Managed Care – PPO | Source: Ambulatory Visit | Attending: Radiation Oncology | Admitting: Radiation Oncology

## 2020-12-01 ENCOUNTER — Inpatient Hospital Stay: Payer: BC Managed Care – PPO

## 2020-12-01 ENCOUNTER — Inpatient Hospital Stay (HOSPITAL_BASED_OUTPATIENT_CLINIC_OR_DEPARTMENT_OTHER): Payer: BC Managed Care – PPO | Admitting: Oncology

## 2020-12-01 VITALS — BP 109/77 | HR 112 | Temp 98.5°F | Resp 16 | Wt 134.1 lb

## 2020-12-01 DIAGNOSIS — G893 Neoplasm related pain (acute) (chronic): Secondary | ICD-10-CM

## 2020-12-01 DIAGNOSIS — R634 Abnormal weight loss: Secondary | ICD-10-CM

## 2020-12-01 DIAGNOSIS — J90 Pleural effusion, not elsewhere classified: Secondary | ICD-10-CM

## 2020-12-01 DIAGNOSIS — C3490 Malignant neoplasm of unspecified part of unspecified bronchus or lung: Secondary | ICD-10-CM | POA: Diagnosis not present

## 2020-12-01 DIAGNOSIS — Z5111 Encounter for antineoplastic chemotherapy: Secondary | ICD-10-CM | POA: Diagnosis not present

## 2020-12-01 DIAGNOSIS — Z510 Encounter for antineoplastic radiation therapy: Secondary | ICD-10-CM | POA: Diagnosis not present

## 2020-12-01 DIAGNOSIS — D649 Anemia, unspecified: Secondary | ICD-10-CM

## 2020-12-01 DIAGNOSIS — E871 Hypo-osmolality and hyponatremia: Secondary | ICD-10-CM

## 2020-12-01 DIAGNOSIS — I871 Compression of vein: Secondary | ICD-10-CM

## 2020-12-01 LAB — CBC WITH DIFFERENTIAL/PLATELET
Abs Immature Granulocytes: 0.02 10*3/uL (ref 0.00–0.07)
Basophils Absolute: 0 10*3/uL (ref 0.0–0.1)
Basophils Relative: 0 %
Eosinophils Absolute: 0 10*3/uL (ref 0.0–0.5)
Eosinophils Relative: 0 %
HCT: 32.4 % — ABNORMAL LOW (ref 36.0–46.0)
Hemoglobin: 10.6 g/dL — ABNORMAL LOW (ref 12.0–15.0)
Immature Granulocytes: 0 %
Lymphocytes Relative: 12 %
Lymphs Abs: 0.6 10*3/uL — ABNORMAL LOW (ref 0.7–4.0)
MCH: 25.8 pg — ABNORMAL LOW (ref 26.0–34.0)
MCHC: 32.7 g/dL (ref 30.0–36.0)
MCV: 78.8 fL — ABNORMAL LOW (ref 80.0–100.0)
Monocytes Absolute: 0.6 10*3/uL (ref 0.1–1.0)
Monocytes Relative: 13 %
Neutro Abs: 3.7 10*3/uL (ref 1.7–7.7)
Neutrophils Relative %: 75 %
Platelets: 161 10*3/uL (ref 150–400)
RBC: 4.11 MIL/uL (ref 3.87–5.11)
RDW: 14.6 % (ref 11.5–15.5)
WBC: 4.9 10*3/uL (ref 4.0–10.5)
nRBC: 0 % (ref 0.0–0.2)

## 2020-12-01 LAB — COMPREHENSIVE METABOLIC PANEL
ALT: 22 U/L (ref 0–44)
AST: 20 U/L (ref 15–41)
Albumin: 2.7 g/dL — ABNORMAL LOW (ref 3.5–5.0)
Alkaline Phosphatase: 84 U/L (ref 38–126)
Anion gap: 9 (ref 5–15)
BUN: 10 mg/dL (ref 8–23)
CO2: 26 mmol/L (ref 22–32)
Calcium: 8.3 mg/dL — ABNORMAL LOW (ref 8.9–10.3)
Chloride: 93 mmol/L — ABNORMAL LOW (ref 98–111)
Creatinine, Ser: 0.6 mg/dL (ref 0.44–1.00)
GFR, Estimated: >60 mL/min (ref >60)
Glucose, Bld: 101 mg/dL — ABNORMAL HIGH (ref 70–99)
Potassium: 4.1 mmol/L (ref 3.5–5.1)
Sodium: 128 mmol/L — ABNORMAL LOW (ref 135–145)
Total Bilirubin: 0.6 mg/dL (ref 0.3–1.2)
Total Protein: 6.7 g/dL (ref 6.5–8.1)

## 2020-12-01 MED ORDER — SODIUM CHLORIDE 0.9 % IV SOLN
Freq: Once | INTRAVENOUS | Status: AC
  Filled 2020-12-01: qty 250

## 2020-12-01 MED ORDER — XTAMPZA ER 13.5 MG PO C12A: 13.5000 mg | EXTENDED_RELEASE_CAPSULE | Freq: Two times a day (BID) | ORAL | 0 refills | Status: DC

## 2020-12-01 NOTE — Progress Notes (Addendum)
1q    Hematology/Oncology Follow Up Note [facility]  Telephone:([phone] [phone]  Patient Care Team: Brittney Fisher, [place] as PCP - General (Family Medicine) Brittney Nab, RN as Oncology Nurse Navigator   Name of the patient: Brittney Fisher  [id number]  [birth year]   REASON FOR VISIT  follow-up for lung mass pleural effusion  PERTINENT ONCOLOGY HISTORY 59 y.o. female with past medical history including GERD, COPD, current everyday smoker presents for follow-up of lung mass and pleural effusion. 10/20/2020, patient was brought to ED via EMS due to generalized weakness, dizziness, chest pain shortness of breath.  She was recently diagnosed with COPD approximately 1 month ago by primary care provider.  Also unintentional weight loss during the last few months. Image work-up showed right-sided pleural effusion.  She underwent right thoracentesis.  With removal of 2.4 L of fluid. 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  Metastatic disease to low cervical/thoracic nodes, lung, right pleural space and bones.  Moderate to large right pleural effusion.  SVC narrowing. 10/21/2020 MRI brain is negative for metastasis.  Mild chronic microvascular ischemic changes in the white matter.  Negative for acute infarct Regarding to the SVC narrowing, patient was seen by vascular surgeon and was recommended no intervention inpatient.  Patient to follow-up outpatient with vascular surgeon for evaluation. Patient also was treated for COPD exacerbation with hypoxia on exertion. Qualifies for home oxygen and hospitalist arrange home health and a nebulizer.  Patient was given a course of prednisone taper and empiric Levaquin. Patient was discharged and present today to follow-up with cytology results and further management plan  10/27/2020 PET scan showed right upper lobe primary bronchogenic  mass with direct invasion into mediastinum. Metastatic disease to the right pleural space, lung, bone, nodes of the chest and less so lower leg/upper abdomen. Hypermetabolic him along the course of the right axillary vein with concurrent subtle hyper attenuation within the axillary vein and SVC.  This continues to the level of SVC narrowing.  Suspicious for SVC occlusion and developing thrombus. Moderate right pleural effusion and small pericardial effusion.  # # left supraclavicular mass biopsy- pathology is positive for adenocarcinoma. positive for CK7, with patchy, weak CK20. They are  negative for TTF1, NapsinA, GATA3, CDX2, and Pax8. The findings are  nonspecific; but may be compatible with a poorly differentiated  adenocarcinoma of lung origin, especially given the imaging findings  She also had another repeat thoracentesis and fluid cytology showed malignancy.  #NGS showed AXIN 1 [id number], DIS2 M171f, KRAS G12C, PRDM1 M1 [id number], [id number] MS stable, TPS <1%   tarted palliative RT since 11/14/2020. INTERVAL HISTORY Brittney BLUMENSTOCKis a 59y.o. female who has above history reviewed by me today presents for follow up visit for management of metastatic lung cancer.  Problems and complaints are listed below: Constipation, last bowel movement was 4 days ago.  She takes Senokot 2 tablets daily. Last use of MiraLAX was 4 days ago and she had a bowel movement. Neoplasm related pain, she rated 6 out of 10 today.  She takes oxycodone 5 mg every 3 hours.  Denies any bleeding difficulties today.  She is on radiation therapy. Appetite is fair.  She drinks nutrition supplements.  Gained 1 pound since last visit.   Review of Systems  Constitutional: Positive for fatigue. Negative for appetite change, chills, fever and unexpected weight change.  HENT:   Negative for hearing loss and  voice change.   Eyes: Negative for eye problems.  Respiratory: Negative for chest tightness, cough and  shortness of breath.   Cardiovascular: Negative for chest pain.  Gastrointestinal: Negative for abdominal distention, abdominal pain and blood in stool.  Endocrine: Negative for hot flashes.  Genitourinary: Negative for difficulty urinating and frequency.   Musculoskeletal: Negative for arthralgias.       Right side chest wall pain   Skin: Negative for itching and rash.  Neurological: Negative for extremity weakness.  Hematological: Negative for adenopathy.  Psychiatric/Behavioral: Negative for confusion.      No Known Allergies   Past Medical History:  Diagnosis Date  . COPD (chronic obstructive pulmonary disease) ([place])   . GERD (gastroesophageal reflux disease)   . Metastatic lung cancer (metastasis from lung to other site) [facility]) 11/02/2020     Past Surgical History:  Procedure Laterality Date  . COLONOSCOPY WITH ESOPHAGOGASTRODUODENOSCOPY (EGD)    . COLONOSCOPY WITH PROPOFOL N/A 11/14/2015   Procedure: COLONOSCOPY WITH PROPOFOL;  Surgeon: Brittney Luster, MD;  Location: [facility] ENDOSCOPY;  Service: Gastroenterology;  Laterality: N/A;  . ESOPHAGOGASTRODUODENOSCOPY (EGD) WITH PROPOFOL N/A 11/14/2015   Procedure: ESOPHAGOGASTRODUODENOSCOPY (EGD) WITH PROPOFOL;  Surgeon: Brittney Luster, MD;  Location: [facility] ENDOSCOPY;  Service: Gastroenterology;  Laterality: N/A;    Social History   Socioeconomic History  . Marital status: Married    Spouse name: Not on file  . Number of children: Not on file  . Years of education: Not on file  . Highest education level: Not on file  Occupational History  . Not on file  Tobacco Use  . Smoking status: Current Every Day Smoker    Packs/day: 0.20    Types: Cigarettes  . Smokeless tobacco: Never Used  Substance and Sexual Activity  . Alcohol use: Never  . Drug use: Never  . Sexual activity: Not Currently  Other Topics Concern  . Not on file  Social History Narrative  . Not on file   Social Determinants of Health   Financial Resource Strain:  Not on file  Food Insecurity: Not on file  Transportation Needs: Not on file  Physical Activity: Not on file  Stress: Not on file  Social Connections: Not on file  Intimate Partner Violence: Not on file    Family History  Problem Relation Age of Onset  . Breast cancer Cousin 9  . Diabetes type II Mother   . Hypertension Mother   . Colon cancer Father   . Hypertension Father      Current Outpatient Medications:  .  acetaminophen (TYLENOL) 500 MG tablet, Take 1,000 mg by mouth every 8 (eight) hours as needed for moderate pain. , Disp: , Rfl:  .  benzonatate (TESSALON) 100 MG capsule, Take 1 capsule (100 mg total) by mouth 3 (three) times daily as needed for cough., Disp: 60 capsule, Rfl: 1 .  dexamethasone (DECADRON) 4 MG tablet, Take 1 tab (4 mg) twice a day the day before Alimta chemo. Take 2 tablets (8 mg) po daily x 3 days starting the day after chemo., Disp: 30 tablet, Rfl: 1 .  feeding supplement (ENSURE ENLIVE / ENSURE PLUS) LIQD, Take 237 mLs by mouth 2 (two) times daily between meals., Disp: 237 mL, Rfl: 12 .  folic acid (FOLVITE) 1 MG tablet, Take 1 tablet (1 mg total) by mouth daily. Start 5-7 days before Alimta chemotherapy. Continue until 21 days after Alimta completed., Disp: 100 tablet, Rfl: 3 .  guaiFENesin (MUCINEX) 600 MG 12  hr tablet, Take 1 tablet (600 mg total) by mouth 2 (two) times daily., Disp: 14 tablet, Rfl: 0 .  ipratropium-albuterol (DUONEB) 0.5-2.5 (3) MG/3ML SOLN, Take 3 mLs by nebulization 3 (three) times daily., Disp: 360 mL, Rfl: 1 .  omeprazole (PRILOSEC) 20 MG capsule, Take 1 capsule (20 mg total) by mouth 2 (two) times daily before a meal., Disp: 60 capsule, Rfl: 1 .  ondansetron (ZOFRAN) 8 MG tablet, Take 1 tablet (8 mg total) by mouth 2 (two) times daily as needed for refractory nausea / vomiting. Start on day 3 after chemo., Disp: 30 tablet, Rfl: 1 .  oxyCODONE (OXY IR/ROXICODONE) 5 MG immediate release tablet, Take 1 tablet (5 mg total) by mouth  every 3 (three) hours as needed for severe pain., Disp: 60 tablet, Rfl: 0 .  Polyethylene Glycol 3350 (MIRALAX PO), Take by mouth daily as needed., Disp: , Rfl:  .  prochlorperazine (COMPAZINE) 10 MG tablet, Take 1 tablet (10 mg total) by mouth every 6 (six) hours as needed (Nausea or vomiting)., Disp: 30 tablet, Rfl: 1 .  senna-docusate (SENOKOT-S) 8.6-50 MG tablet, Take 2 tablets by mouth daily., Disp: 60 tablet, Rfl: 3 .  tiotropium (SPIRIVA HANDIHALER) 18 MCG inhalation capsule, Place 18 mcg into inhaler and inhale daily., Disp: , Rfl:  .  lidocaine-prilocaine (EMLA) cream, Apply to affected area once (Patient not taking: No sig reported), Disp: 30 g, Rfl: 3 .  oxyCODONE ER (XTAMPZA ER) 13.5 MG C12A, Take 13.5 mg by mouth every 12 (twelve) hours., Disp: 28 capsule, Rfl: 0  Physical exam:  Vitals:   12/01/20 1049  BP: 109/77  Pulse: (!) 112  Resp: 16  Temp: 98.5 F (36.9 C)  TempSrc: Tympanic  SpO2: 98%  Weight: 134 lb 1.6 oz (60.8 kg)   Physical Exam Constitutional:      General: She is not in acute distress.    Comments: Thin built female walks independently  HENT:     Head: Normocephalic and atraumatic.  Eyes:     General: No scleral icterus. Cardiovascular:     Rate and Rhythm: Regular rhythm. Tachycardia present.     Heart sounds: Normal heart sounds.  Pulmonary:     Effort: Pulmonary effort is normal. No respiratory distress.     Breath sounds: No wheezing.     Comments: Decreased breath sounds in right lower lung Abdominal:     General: Bowel sounds are normal. There is no distension.     Palpations: Abdomen is soft.  Musculoskeletal:        General: No deformity. Normal range of motion.     Cervical back: Normal range of motion and neck supple.  Skin:    General: Skin is warm and dry.     Findings: No erythema or rash.  Neurological:     Mental Status: She is alert and oriented to person, place, and time. Mental status is at baseline.     Cranial Nerves: No  cranial nerve deficit.     Coordination: Coordination normal.  Psychiatric:        Mood and Affect: Mood normal.     CMP Latest Ref Rng & Units 12/01/2020  Glucose 70 - 99 mg/dL 101(H)  BUN 8 - 23 mg/dL 10  Creatinine 0.44 - 1.00 mg/dL 0.60  Sodium 135 - 145 mmol/L 128(L)  Potassium 3.5 - 5.1 mmol/L 4.1  Chloride 98 - 111 mmol/L 93(L)  CO2 22 - 32 mmol/L 26  Calcium 8.9 - 10.3 mg/dL 8.3(L)  Total Protein 6.5 - 8.1 g/dL 6.7  Total Bilirubin 0.3 - 1.2 mg/dL 0.6  Alkaline Phos 38 - 126 U/L 84  AST 15 - 41 U/L 20  ALT 0 - 44 U/L 22   CBC Latest Ref Rng & Units 12/01/2020  WBC 4.0 - 10.5 K/uL 4.9  Hemoglobin 12.0 - 15.0 g/dL 10.6(L)  Hematocrit 36.0 - 46.0 % 32.4(L)  Platelets 150 - 400 K/uL 161    RADIOGRAPHIC STUDIES: I have personally reviewed the radiological images as listed and agreed with the findings in the report. [place] CHEST (PLEURAL EFFUSION)  Result Date: 11/15/2020 CLINICAL DATA:  59 year old with lung cancer and history of right pleural effusion. Evaluate pleural fluid for possible thoracentesis. Right thoracentesis was performed on 10/31/2020. EXAM: CHEST ULTRASOUND COMPARISON:  10/31/2020 FINDINGS: Small amount of loculated pleural fluid identified in the right lower lateral chest. No significant left pleural fluid. IMPRESSION: Small amount of loculated right pleural fluid. Therapeutic thoracentesis was not performed due to the small amount of pleural fluid. Electronically Signed   By: Markus Daft M.D.   On: 11/15/2020 14:43     Assessment and plan  1. Primary malignant neoplasm of lung metastatic to other site, unspecified laterality ([place])   2. Pleural effusion   3. Weight loss   4. Neoplasm related pain   5. SVC obstruction   6. Anemia, unspecified type   7. 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  cT4 N3 M1-  KRASG12C (Lumakras in subsequent line can be considered) Status post 1st line palliative chemotherapy cycle 1 carboplatin and Alimta. She tolerated treatment. Labs reviewed and discussed with patient.  Keytruda on hold due to being on radiation Continue folic acid supplementation.  #Bronchial obstruction and SVC occlusion.  Continue palliative radiation She has follow-up appointment with vascular surgeon Dr. Lucky Cowboy for further management and Dr. Lucky Cowboy will determine if she is a candidate for Mediport placement.  #Neoplasm related pain, she is currently taking oxycodone 5 mg every 3 hours as needed.  Pain is partially controlled. Add oxycodone ER 13.5 mg twice daily.  Discussed with her about the rationale and possible side effects.  She agrees.  Prescription was sent to pharmacy.  #Unintentional weight loss, refer to nutritionist.  Recommend nutrition supplementation. #Tachycardic, chronic.  Likely due to underlying cancer, poor oral intake. #Hyponatremia, patient will receive 1 L of normal saline x1. #Anemia, likely secondary to chemotherapy radiation and underlying malignancy.  #Right pleural effusion, stable.  Follow-up with me on 12/12/2020 for next cycle of treatment.  Follow-up with NP next week, repeat BMP, hydration  Addendum - Ginger Organ is not on her preferred med list. Switch to MS contin 13m BID. Rx sent to pharmacy  ZEarlie Server MD, PhD Hematology Oncology CPhoebe Worth Medical Centerat AMethodist [facility] Medical CenterPager- [phone]/16/2021

## 2020-12-01 NOTE — Progress Notes (Signed)
Nutrition Follow-up:  Patient with metastatic lung cancer and malignant pleural effusion.  Patient receiving palliative radiation and carboplatin, alimta, immunotherapy on hold during radiation.   Met with patient and niece during fluids.  Patient reports that she ate watermelon and subway sandwich yesterday.  Drinking 1 ensure per day. Think they are constipating her.  Reports no bowel movement in 4 days (on pain medication).  Planning to start miralax.      Medications: reviewed  Labs: reviweed  Anthropometrics:   Weight is 134 lb 1.6 oz today increased from 133 lb 12.8 oz on 12/10  141 lb on 12/3  NUTRITION DIAGNOSIS: Inadequate oral intake continues   INTERVENTION:  Encouraged patient to take medications for constipation and increase fluid intake.  Encouraged patient to increase ensure to 2 per day.  She will try but feels constipation is coming from ensure. Maybe more related to pain medications Patient has contact information    MONITORING, EVALUATION, GOAL: weight trends, intake   NEXT VISIT: Jan 20 phone f/u  Brittney Fisher, [place], [place] Registered Dietitian [phone] (mobile)

## 2020-12-01 NOTE — Progress Notes (Signed)
Patient has constipation and takes Senna with last bowel movement 4 days ago.  Reports pain is under better control with current Oxycodone 5 mg Q3H prn.

## 2020-12-02 ENCOUNTER — Ambulatory Visit
Admission: RE | Admit: 2020-12-02 | Discharge: 2020-12-02 | Disposition: A | Discharge status: Home / Self Care | Payer: BC Managed Care – PPO | Source: Ambulatory Visit | Attending: Radiation Oncology | Admitting: Radiation Oncology

## 2020-12-02 DIAGNOSIS — Z510 Encounter for antineoplastic radiation therapy: Secondary | ICD-10-CM | POA: Diagnosis not present

## 2020-12-02 LAB — ACID FAST CULTURE WITH REFLEXED SENSITIVITIES (MYCOBACTERIA): Acid Fast Culture: NEGATIVE

## 2020-12-02 MED ORDER — MORPHINE SULFATE ER 15 MG PO TBCR: 15.0000 mg | EXTENDED_RELEASE_TABLET | Freq: Two times a day (BID) | ORAL | 0 refills | Status: DC

## 2020-12-02 NOTE — Addendum Note (Signed)
Addended by: [name] on: 12/02/2020 03:52 PM   Modules accepted: Orders

## 2020-12-05 ENCOUNTER — Inpatient Hospital Stay: Payer: BC Managed Care – PPO

## 2020-12-05 ENCOUNTER — Inpatient Hospital Stay (HOSPITAL_BASED_OUTPATIENT_CLINIC_OR_DEPARTMENT_OTHER): Payer: BC Managed Care – PPO | Admitting: Oncology

## 2020-12-05 ENCOUNTER — Ambulatory Visit
Admission: RE | Admit: 2020-12-05 | Discharge: 2020-12-05 | Disposition: A | Discharge status: Home / Self Care | Payer: BC Managed Care – PPO | Source: Ambulatory Visit | Attending: Radiation Oncology | Admitting: Radiation Oncology

## 2020-12-05 ENCOUNTER — Encounter: Payer: Self-pay | Admitting: Oncology

## 2020-12-05 VITALS — BP 117/64 | HR 115 | Temp 98.0°F

## 2020-12-05 DIAGNOSIS — Z510 Encounter for antineoplastic radiation therapy: Secondary | ICD-10-CM | POA: Diagnosis not present

## 2020-12-05 DIAGNOSIS — C3490 Malignant neoplasm of unspecified part of unspecified bronchus or lung: Secondary | ICD-10-CM

## 2020-12-05 DIAGNOSIS — E860 Dehydration: Secondary | ICD-10-CM

## 2020-12-05 DIAGNOSIS — Z5111 Encounter for antineoplastic chemotherapy: Secondary | ICD-10-CM | POA: Diagnosis not present

## 2020-12-05 LAB — BASIC METABOLIC PANEL
Anion gap: 8 (ref 5–15)
BUN: 10 mg/dL (ref 8–23)
CO2: 28 mmol/L (ref 22–32)
Calcium: 8.2 mg/dL — ABNORMAL LOW (ref 8.9–10.3)
Chloride: 95 mmol/L — ABNORMAL LOW (ref 98–111)
Creatinine, Ser: 0.66 mg/dL (ref 0.44–1.00)
GFR, Estimated: >60 mL/min (ref >60)
Glucose, Bld: 95 mg/dL (ref 70–99)
Potassium: 4.1 mmol/L (ref 3.5–5.1)
Sodium: 131 mmol/L — ABNORMAL LOW (ref 135–145)

## 2020-12-05 MED ORDER — SODIUM CHLORIDE 0.9 % IV SOLN
Freq: Once | INTRAVENOUS | Status: DC
  Filled 2020-12-05: qty 250

## 2020-12-05 NOTE — Progress Notes (Signed)
Pt here for follow up with symptom management. Pt reports that she is constipated. Last BM was yesterday.

## 2020-12-05 NOTE — Progress Notes (Signed)
Re: follow-up  Brittney Fisher is 59 year old female with past medical history significant for GERD, COPD and tobacco abuse.  She was recently diagnosed with lung cancer after presenting to the emergency room for generalized weakness, dizziness, chest pain and shortness of breath.  She is followed by Dr. [name] and is status post 1 cycle of chemotherapy.   Patient presents today for lab work and IV fluids.  She states she feels well and does not need any additional fluids.  She denies any nausea, vomiting, constipation or diarrhea.  She denies any significant side effects from her first treatment.  Lab work from today is stable.  Sodium level 131.  I have recommended IV fluids but she would like to hold off.  I have encouraged oral hydration.  Patient states she is having trouble having a bowel movement.  Last BM was 1 to 2 days ago.  I have recommended MiraLAX and Senokot and for her to increase her fluids.  She presents daily for radiation and is scheduled for follow-up with Dr. [name] on 12/12/2020.  She was instructed to let us know if she felt dehydrated or developed any significant symptoms.  Faythe Casa, NP 12/07/2020 5:05 AM

## 2020-12-05 NOTE — Progress Notes (Signed)
Pt states I don't feel weak or dehydrated today. Doesn't want IVF's. States I really just need to go home and have a BM. Per request No IVFs given. BMP labs discussed with pt and are okay. Pt decided not to wait on NP for visit today since not getting anything.

## 2020-12-06 ENCOUNTER — Other Ambulatory Visit: Payer: Self-pay | Admitting: *Deleted

## 2020-12-06 ENCOUNTER — Ambulatory Visit
Admission: RE | Admit: 2020-12-06 | Discharge: 2020-12-06 | Disposition: A | Discharge status: Home / Self Care | Payer: BC Managed Care – PPO | Source: Ambulatory Visit | Attending: Radiation Oncology | Admitting: Radiation Oncology

## 2020-12-06 ENCOUNTER — Ambulatory Visit: Payer: BC Managed Care – PPO

## 2020-12-06 ENCOUNTER — Other Ambulatory Visit: Payer: BC Managed Care – PPO

## 2020-12-06 DIAGNOSIS — Z510 Encounter for antineoplastic radiation therapy: Secondary | ICD-10-CM | POA: Diagnosis not present

## 2020-12-06 MED ORDER — BENZONATATE 100 MG PO CAPS: 100.0000 mg | ORAL_CAPSULE | Freq: Three times a day (TID) | ORAL | 1 refills | Status: DC | PRN

## 2020-12-07 ENCOUNTER — Ambulatory Visit
Admission: RE | Admit: 2020-12-07 | Discharge: 2020-12-07 | Disposition: A | Discharge status: Home / Self Care | Payer: BC Managed Care – PPO | Source: Ambulatory Visit | Attending: Radiation Oncology | Admitting: Radiation Oncology

## 2020-12-07 DIAGNOSIS — Z510 Encounter for antineoplastic radiation therapy: Secondary | ICD-10-CM | POA: Diagnosis not present

## 2020-12-08 ENCOUNTER — Ambulatory Visit
Admission: RE | Admit: 2020-12-08 | Discharge: 2020-12-08 | Disposition: A | Discharge status: Home / Self Care | Payer: BC Managed Care – PPO | Source: Ambulatory Visit | Attending: Radiation Oncology | Admitting: Radiation Oncology

## 2020-12-08 DIAGNOSIS — Z510 Encounter for antineoplastic radiation therapy: Secondary | ICD-10-CM | POA: Diagnosis not present

## 2020-12-12 ENCOUNTER — Inpatient Hospital Stay: Payer: BC Managed Care – PPO

## 2020-12-12 ENCOUNTER — Encounter: Payer: Self-pay | Admitting: Oncology

## 2020-12-12 ENCOUNTER — Inpatient Hospital Stay (HOSPITAL_BASED_OUTPATIENT_CLINIC_OR_DEPARTMENT_OTHER): Payer: BC Managed Care – PPO | Admitting: Oncology

## 2020-12-12 ENCOUNTER — Ambulatory Visit
Admission: RE | Admit: 2020-12-12 | Discharge: 2020-12-12 | Disposition: A | Discharge status: Home / Self Care | Payer: BC Managed Care – PPO | Source: Ambulatory Visit | Attending: Radiation Oncology | Admitting: Radiation Oncology

## 2020-12-12 VITALS — BP 107/67 | HR 106 | Temp 96.9°F | Resp 18 | Wt 129.4 lb

## 2020-12-12 DIAGNOSIS — I871 Compression of vein: Secondary | ICD-10-CM | POA: Diagnosis not present

## 2020-12-12 DIAGNOSIS — Z5111 Encounter for antineoplastic chemotherapy: Secondary | ICD-10-CM | POA: Diagnosis not present

## 2020-12-12 DIAGNOSIS — J90 Pleural effusion, not elsewhere classified: Secondary | ICD-10-CM | POA: Diagnosis not present

## 2020-12-12 DIAGNOSIS — C3490 Malignant neoplasm of unspecified part of unspecified bronchus or lung: Secondary | ICD-10-CM

## 2020-12-12 DIAGNOSIS — G893 Neoplasm related pain (acute) (chronic): Secondary | ICD-10-CM

## 2020-12-12 DIAGNOSIS — Z510 Encounter for antineoplastic radiation therapy: Secondary | ICD-10-CM | POA: Diagnosis not present

## 2020-12-12 LAB — CBC WITH DIFFERENTIAL/PLATELET
Abs Immature Granulocytes: 0.03 10*3/uL (ref 0.00–0.07)
Basophils Absolute: 0 10*3/uL (ref 0.0–0.1)
Basophils Relative: 0 %
Eosinophils Absolute: 0 10*3/uL (ref 0.0–0.5)
Eosinophils Relative: 0 %
HCT: 33.6 % — ABNORMAL LOW (ref 36.0–46.0)
Hemoglobin: 11.1 g/dL — ABNORMAL LOW (ref 12.0–15.0)
Immature Granulocytes: 1 %
Lymphocytes Relative: 12 %
Lymphs Abs: 0.7 10*3/uL (ref 0.7–4.0)
MCH: 26.1 pg (ref 26.0–34.0)
MCHC: 33 g/dL (ref 30.0–36.0)
MCV: 79.1 fL — ABNORMAL LOW (ref 80.0–100.0)
Monocytes Absolute: 0.8 10*3/uL (ref 0.1–1.0)
Monocytes Relative: 13 %
Neutro Abs: 4.4 10*3/uL (ref 1.7–7.7)
Neutrophils Relative %: 74 %
Platelets: 236 10*3/uL (ref 150–400)
RBC: 4.25 MIL/uL (ref 3.87–5.11)
RDW: 15.8 % — ABNORMAL HIGH (ref 11.5–15.5)
WBC: 5.9 10*3/uL (ref 4.0–10.5)
nRBC: 0 % (ref 0.0–0.2)

## 2020-12-12 LAB — COMPREHENSIVE METABOLIC PANEL
ALT: 16 U/L (ref 0–44)
AST: 21 U/L (ref 15–41)
Albumin: 2.8 g/dL — ABNORMAL LOW (ref 3.5–5.0)
Alkaline Phosphatase: 83 U/L (ref 38–126)
Anion gap: 10 (ref 5–15)
BUN: 15 mg/dL (ref 8–23)
CO2: 26 mmol/L (ref 22–32)
Calcium: 8.8 mg/dL — ABNORMAL LOW (ref 8.9–10.3)
Chloride: 99 mmol/L (ref 98–111)
Creatinine, Ser: 0.56 mg/dL (ref 0.44–1.00)
GFR, Estimated: >60 mL/min (ref >60)
Glucose, Bld: 105 mg/dL — ABNORMAL HIGH (ref 70–99)
Potassium: 3.9 mmol/L (ref 3.5–5.1)
Sodium: 135 mmol/L (ref 135–145)
Total Bilirubin: 0.4 mg/dL (ref 0.3–1.2)
Total Protein: 7.4 g/dL (ref 6.5–8.1)

## 2020-12-12 LAB — PROTEIN, URINE, RANDOM: Total Protein, Urine: 17 mg/dL

## 2020-12-12 MED ORDER — SODIUM CHLORIDE 0.9 % IV SOLN
500.0000 mg/m2 | Freq: Once | INTRAVENOUS | Status: AC
  Administered 2020-12-12: 14:00:00 900 mg via INTRAVENOUS
  Filled 2020-12-12: qty 20

## 2020-12-12 MED ORDER — SODIUM CHLORIDE 0.9 % IV SOLN
10.0000 mg | Freq: Once | INTRAVENOUS | Status: AC
  Administered 2020-12-12: 14:00:00 10 mg via INTRAVENOUS
  Filled 2020-12-12: qty 10

## 2020-12-12 MED ORDER — SODIUM CHLORIDE 0.9 % IV SOLN
503.5000 mg | Freq: Once | INTRAVENOUS | Status: AC
Start: 2020-12-12 — End: 2020-12-12
  Administered 2020-12-12: 15:00:00 500 mg via INTRAVENOUS
  Filled 2020-12-12: qty 50

## 2020-12-12 MED ORDER — PALONOSETRON HCL INJECTION 0.25 MG/5ML
0.2500 mg | Freq: Once | INTRAVENOUS | Status: AC
  Administered 2020-12-12: 13:00:00 0.25 mg via INTRAVENOUS
  Filled 2020-12-12: qty 5

## 2020-12-12 MED ORDER — SODIUM CHLORIDE 0.9 % IV SOLN
15.0000 mg/kg | Freq: Once | INTRAVENOUS | Status: AC
  Administered 2020-12-12: 15:00:00 900 mg via INTRAVENOUS
  Filled 2020-12-12: qty 32

## 2020-12-12 MED ORDER — SODIUM CHLORIDE 0.9 % IV SOLN
150.0000 mg | Freq: Once | INTRAVENOUS | Status: AC
  Administered 2020-12-12: 14:00:00 150 mg via INTRAVENOUS
  Filled 2020-12-12: qty 150

## 2020-12-12 MED ORDER — SODIUM CHLORIDE 0.9 % IV SOLN
Freq: Once | INTRAVENOUS | Status: AC
  Filled 2020-12-12: qty 250

## 2020-12-12 NOTE — Progress Notes (Signed)
1q    Hematology/Oncology Follow Up Note [facility]  [phone] [phone]  Patient Care Team: Shane Crutch, [place] as PCP - General (Family Medicine) Glory Buff, RN as Oncology Nurse Navigator Rickard Patience, MD as Medical Oncologist (Oncology)   Name of the patient: Brittney Fisher  [id number]  [birth year]   REASON FOR VISIT  follow-up for lung mass pleural effusion  PERTINENT ONCOLOGY HISTORY 59 y.o. female with past medical history including GERD, COPD, current everyday smoker presents for follow-up of lung mass and pleural effusion. 10/20/2020, patient was brought to ED via EMS due to generalized weakness, dizziness, chest pain shortness of breath.  She was recently diagnosed with COPD approximately 1 month ago by primary care provider.  Also unintentional weight loss during the last few months. Image work-up showed right-sided pleural effusion.  She underwent right thoracentesis.  With removal of 2.4 L of fluid. 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  Metastatic disease to low cervical/thoracic nodes, lung, right pleural space and bones.  Moderate to large right pleural effusion.  SVC narrowing. 10/21/2020 MRI brain is negative for metastasis.  Mild chronic microvascular ischemic changes in the white matter.  Negative for acute infarct Regarding to the SVC narrowing, patient was seen by vascular surgeon and was recommended no intervention inpatient.  Patient to follow-up outpatient with vascular surgeon for evaluation. Patient also was treated for COPD exacerbation with hypoxia on exertion. Qualifies for home oxygen and hospitalist arrange home health and a nebulizer.  Patient was given a course of prednisone taper and empiric Levaquin. Patient was discharged and present today to follow-up with cytology results and further management plan  10/27/2020 PET  scan showed right upper lobe primary bronchogenic mass with direct invasion into mediastinum. Metastatic disease to the right pleural space, lung, bone, nodes of the chest and less so lower leg/upper abdomen. Hypermetabolic him along the course of the right axillary vein with concurrent subtle hyper attenuation within the axillary vein and SVC.  This continues to the level of SVC narrowing.  Suspicious for SVC occlusion and developing thrombus. Moderate right pleural effusion and small pericardial effusion.  # # left supraclavicular mass biopsy- pathology is positive for adenocarcinoma. positive for CK7, with patchy, weak CK20. They are  negative for TTF1, NapsinA, GATA3, CDX2, and Pax8. The findings are  nonspecific; but may be compatible with a poorly differentiated  adenocarcinoma of lung origin, especially given the imaging findings  She also had another repeat thoracentesis and fluid cytology showed malignancy.  #NGS showed AXIN 1 [id number], DIS2 M129fs, KRAS G12C, PRDM1 M1 [id number], MS stable, TPS <1%   tarted palliative RT since 11/14/2020. INTERVAL HISTORY Brittney Fisher is a 59 y.o. female who has above history reviewed by me today presents for follow up visit for management of metastatic lung cancer.  Problems and complaints are listed below: Patient has good appetite.  Lost another 5 pounds since last visit. Denies any nausea vomiting diarrhea, shortness of breath.  She is on palliative radiation.  Pain is controlled  Review of Systems  Constitutional: Positive for fatigue. Negative for appetite change, chills, fever and unexpected weight change.  HENT:   Negative for hearing loss and voice change.   Eyes: Negative for eye problems.  Respiratory: Negative for chest tightness, cough and shortness of breath.   Cardiovascular: Negative for chest pain.  Gastrointestinal: Negative for abdominal distention, abdominal pain and blood in stool.  Endocrine: Negative for  hot flashes.  Genitourinary: Negative for difficulty urinating and frequency.   Musculoskeletal: Negative for arthralgias.  Skin: Negative for itching and rash.  Neurological: Negative for extremity weakness.  Hematological: Negative for adenopathy.  Psychiatric/Behavioral: Negative for confusion.      No Known Allergies   Past Medical History:  Diagnosis Date  . COPD (chronic obstructive pulmonary disease) ([place])   . GERD (gastroesophageal reflux disease)   . Metastatic lung cancer (metastasis from lung to other site) [facility]) 11/02/2020     Past Surgical History:  Procedure Laterality Date  . COLONOSCOPY WITH ESOPHAGOGASTRODUODENOSCOPY (EGD)    . COLONOSCOPY WITH PROPOFOL N/A 11/14/2015   Procedure: COLONOSCOPY WITH PROPOFOL;  Surgeon: Hulen Luster, MD;  Location: [facility] ENDOSCOPY;  Service: Gastroenterology;  Laterality: N/A;  . ESOPHAGOGASTRODUODENOSCOPY (EGD) WITH PROPOFOL N/A 11/14/2015   Procedure: ESOPHAGOGASTRODUODENOSCOPY (EGD) WITH PROPOFOL;  Surgeon: Hulen Luster, MD;  Location: [facility] ENDOSCOPY;  Service: Gastroenterology;  Laterality: N/A;    Social History   Socioeconomic History  . Marital status: Married    Spouse name: Not on file  . Number of children: Not on file  . Years of education: Not on file  . Highest education level: Not on file  Occupational History  . Not on file  Tobacco Use  . Smoking status: Current Every Day Smoker    Packs/day: 0.20    Types: Cigarettes  . Smokeless tobacco: Never Used  Substance and Sexual Activity  . Alcohol use: Never  . Drug use: Never  . Sexual activity: Not Currently  Other Topics Concern  . Not on file  Social History Narrative  . Not on file   Social Determinants of Health   Financial Resource Strain: Not on file  Food Insecurity: Not on file  Transportation Needs: Not on file  Physical Activity: Not on file  Stress: Not on file  Social Connections: Not on file  Intimate Partner Violence: Not on file     Family History  Problem Relation Age of Onset  . Breast cancer Cousin 11  . Diabetes type II Mother   . Hypertension Mother   . Colon cancer Father   . Hypertension Father      Current Outpatient Medications:  .  acetaminophen (TYLENOL) 500 MG tablet, Take 1,000 mg by mouth every 8 (eight) hours as needed for moderate pain. , Disp: , Rfl:  .  benzonatate (TESSALON) 100 MG capsule, Take 1 capsule (100 mg total) by mouth 3 (three) times daily as needed for cough., Disp: 60 capsule, Rfl: 1 .  dexamethasone (DECADRON) 4 MG tablet, Take 1 tab (4 mg) twice a day the day before Alimta chemo. Take 2 tablets (8 mg) po daily x 3 days starting the day after chemo., Disp: 30 tablet, Rfl: 1 .  feeding supplement (ENSURE ENLIVE / ENSURE PLUS) LIQD, Take 237 mLs by mouth 2 (two) times daily between meals., Disp: 237 mL, Rfl: 12 .  folic acid (FOLVITE) 1 MG tablet, Take 1 tablet (1 mg total) by mouth daily. Start 5-7 days before Alimta chemotherapy. Continue until 21 days after Alimta completed., Disp: 100 tablet, Rfl: 3 .  guaiFENesin (MUCINEX) 600 MG 12 hr tablet, Take 1 tablet (600 mg total) by mouth 2 (two) times daily., Disp: 14 tablet, Rfl: 0 .  ipratropium-albuterol (DUONEB) 0.5-2.5 (3) MG/3ML SOLN, Take 3 mLs by nebulization 3 (three) times daily., Disp: 360 mL, Rfl: 1 .  lidocaine-prilocaine (EMLA) cream, Apply to affected area once (Patient  not taking: No sig reported), Disp: 30 g, Rfl: 3 .  morphine (MS CONTIN) 15 MG 12 hr tablet, Take 1 tablet (15 mg total) by mouth every 12 (twelve) hours., Disp: 28 tablet, Rfl: 0 .  omeprazole (PRILOSEC) 20 MG capsule, Take 1 capsule (20 mg total) by mouth 2 (two) times daily before a meal., Disp: 60 capsule, Rfl: 1 .  ondansetron (ZOFRAN) 8 MG tablet, Take 1 tablet (8 mg total) by mouth 2 (two) times daily as needed for refractory nausea / vomiting. Start on day 3 after chemo., Disp: 30 tablet, Rfl: 1 .  oxyCODONE (OXY IR/ROXICODONE) 5 MG immediate release  tablet, Take 1 tablet (5 mg total) by mouth every 3 (three) hours as needed for severe pain., Disp: 60 tablet, Rfl: 0 .  Polyethylene Glycol 3350 (MIRALAX PO), Take by mouth daily as needed., Disp: , Rfl:  .  prochlorperazine (COMPAZINE) 10 MG tablet, Take 1 tablet (10 mg total) by mouth every 6 (six) hours as needed (Nausea or vomiting)., Disp: 30 tablet, Rfl: 1 .  senna-docusate (SENOKOT-S) 8.6-50 MG tablet, Take 2 tablets by mouth daily., Disp: 60 tablet, Rfl: 3 .  tiotropium (SPIRIVA HANDIHALER) 18 MCG inhalation capsule, Place 18 mcg into inhaler and inhale daily., Disp: , Rfl:   Physical exam:  Vitals:   12/12/20 0836  BP: 107/67  Pulse: (!) 106  Resp: 18  Temp: (!) 96.9 F (36.1 C)  Weight: 129 lb 6.4 oz (58.7 kg)   Physical Exam Constitutional:      General: She is not in acute distress.    Comments: Thin built female walks independently  HENT:     Head: Normocephalic and atraumatic.  Eyes:     General: No scleral icterus. Cardiovascular:     Rate and Rhythm: Regular rhythm. Tachycardia present.     Heart sounds: Normal heart sounds.  Pulmonary:     Effort: Pulmonary effort is normal. No respiratory distress.     Breath sounds: No wheezing.     Comments: Decreased breath sounds in right lower lung Abdominal:     General: Bowel sounds are normal. There is no distension.     Palpations: Abdomen is soft.  Musculoskeletal:        General: No deformity. Normal range of motion.     Cervical back: Normal range of motion and neck supple.  Skin:    General: Skin is warm and dry.     Findings: No erythema or rash.  Neurological:     Mental Status: She is alert and oriented to person, place, and time. Mental status is at baseline.     Cranial Nerves: No cranial nerve deficit.     Coordination: Coordination normal.  Psychiatric:        Mood and Affect: Mood normal.     CMP Latest Ref Rng & Units 12/05/2020  Glucose 70 - 99 mg/dL 95  BUN 8 - 23 mg/dL 10  Creatinine 0.44  - 1.00 mg/dL 0.66  Sodium 135 - 145 mmol/L 131(L)  Potassium 3.5 - 5.1 mmol/L 4.1  Chloride 98 - 111 mmol/L 95(L)  CO2 22 - 32 mmol/L 28  Calcium 8.9 - 10.3 mg/dL 8.2(L)  Total Protein 6.5 - 8.1 g/dL -  Total Bilirubin 0.3 - 1.2 mg/dL -  Alkaline Phos 38 - 126 U/L -  AST 15 - 41 U/L -  ALT 0 - 44 U/L -   CBC Latest Ref Rng & Units 12/01/2020  WBC 4.0 - 10.5 K/uL  4.9  Hemoglobin 12.0 - 15.0 g/dL 10.6(L)  Hematocrit 36.0 - 46.0 % 32.4(L)  Platelets 150 - 400 K/uL 161    RADIOGRAPHIC STUDIES: I have personally reviewed the radiological images as listed and agreed with the findings in the report. [place] CHEST (PLEURAL EFFUSION)  Result Date: 11/15/2020 CLINICAL DATA:  59 year old with lung cancer and history of right pleural effusion. Evaluate pleural fluid for possible thoracentesis. Right thoracentesis was performed on 10/31/2020. EXAM: CHEST ULTRASOUND COMPARISON:  10/31/2020 FINDINGS: Small amount of loculated pleural fluid identified in the right lower lateral chest. No significant left pleural fluid. IMPRESSION: Small amount of loculated right pleural fluid. Therapeutic thoracentesis was not performed due to the small amount of pleural fluid. Electronically Signed   By: Markus Daft M.D.   On: 11/15/2020 14:43     Assessment and plan  1. Primary malignant neoplasm of lung metastatic to other site, unspecified laterality ([place])   2. Pleural effusion   3. Neoplasm related pain   4. SVC obstruction   5. 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  cT4 N3 M1-  KRASG12C (Lumakras in subsequent line can be considered) Status post 1st line palliative chemotherapy cycle 1 carboplatin and Alimta,  bevacizumab Overall she tolerates well. Labs are reviewed and discussed with patient. Proceed with cycle 2 carboplatin, Alimta and bevacizumab.  Not on immunotherapy due to radiation Continue folic acid supplementation.  #Bronchial obstruction and SVC occlusion.  Continue palliative radiation She has follow-up appointment with vascular surgeon Dr. Lucky Cowboy for further management and Dr. Lucky Cowboy will determine if she is a candidate for Mediport placement.  #Neoplasm related pain, pain is controlled with current regimen.  Continue #Unintentional weight loss, refer to nutritionist.  Continue nutrition supplementation.Marland Kitchen #Tachycardic, chronic.  Likely due to underlying cancer, poor oral intake. #Anemia, likely secondary to chemotherapy radiation and underlying malignancy.  Hemoglobin stable  #Right pleural effusion, stable.  Follow-up with me in 3 weeks.  Patient knows to call clinic if she has new symptoms or concerns.  [name], MD, PhD Hematology Oncology [facility] Pager- [phone] 12/12/2020

## 2020-12-12 NOTE — Progress Notes (Signed)
Patient denies new problems/concerns today.   °

## 2020-12-12 NOTE — Progress Notes (Signed)
Pt received bev carbo alimta tx in clinic today. Tolerated well.

## 2020-12-19 ENCOUNTER — Other Ambulatory Visit: Payer: Self-pay | Admitting: *Deleted

## 2020-12-19 ENCOUNTER — Other Ambulatory Visit: Payer: Self-pay

## 2020-12-19 MED ORDER — MAGIC MOUTHWASH W/LIDOCAINE: 5.0000 mL | Freq: Four times a day (QID) | ORAL | 1 refills | Status: DC | PRN

## 2020-12-19 MED ORDER — OXYCODONE HCL 5 MG PO TABS: 5.0000 mg | ORAL_TABLET | ORAL | 0 refills | Status: DC | PRN

## 2020-12-19 MED ORDER — SUCRALFATE 1 GM/10ML PO SUSP: 1.0000 g | Freq: Three times a day (TID) | ORAL | 0 refills | Status: DC

## 2020-12-19 NOTE — Telephone Encounter (Signed)
Call from Brittney Fisher reporting that patient is having painful swallowing and is asking for medicine to numb her throat or something else for it. Please advise

## 2020-12-19 NOTE — Telephone Encounter (Signed)
Please give her Rx of sucrafate 1g powder TID (if not available, give Rx of sucrafate liquid suspension) Also send her Rx of magic mouth wash, swish and swallow, QID.

## 2020-12-19 NOTE — Telephone Encounter (Signed)
Medications sent to pharmacy

## 2020-12-21 ENCOUNTER — Encounter: Payer: Self-pay | Admitting: *Deleted

## 2020-12-21 NOTE — Progress Notes (Signed)
  Oncology Nurse Navigator Documentation  Navigator Location: [facility]-Med Onc (12/21/20 1100)   )Navigator Encounter Type: Appt/Treatment Plan Review (12/21/20 1100)        Appt/treatment plan review and navigational needs assessement. No barriers identified at this time.                Treatment Phase: Active Tx (12/21/20 1100) Barriers/Navigation Needs: No Barriers At This Time (12/21/20 1100)   Interventions: None Required (12/21/20 1100)

## 2020-12-22 ENCOUNTER — Other Ambulatory Visit: Payer: Self-pay

## 2020-12-22 ENCOUNTER — Inpatient Hospital Stay: Payer: BC Managed Care – PPO | Attending: Oncology

## 2020-12-22 ENCOUNTER — Encounter: Payer: Self-pay | Admitting: Oncology

## 2020-12-22 ENCOUNTER — Inpatient Hospital Stay: Payer: BC Managed Care – PPO

## 2020-12-22 ENCOUNTER — Other Ambulatory Visit: Payer: Self-pay | Admitting: *Deleted

## 2020-12-22 ENCOUNTER — Encounter: Payer: BC Managed Care – PPO | Admitting: Medical

## 2020-12-22 ENCOUNTER — Inpatient Hospital Stay (HOSPITAL_BASED_OUTPATIENT_CLINIC_OR_DEPARTMENT_OTHER): Payer: BC Managed Care – PPO | Admitting: Oncology

## 2020-12-22 VITALS — BP 113/78 | HR 129 | Temp 97.9°F | Resp 20 | Wt 123.9 lb

## 2020-12-22 DIAGNOSIS — D630 Anemia in neoplastic disease: Secondary | ICD-10-CM | POA: Insufficient documentation

## 2020-12-22 DIAGNOSIS — Z803 Family history of malignant neoplasm of breast: Secondary | ICD-10-CM | POA: Diagnosis not present

## 2020-12-22 DIAGNOSIS — J029 Acute pharyngitis, unspecified: Secondary | ICD-10-CM

## 2020-12-22 DIAGNOSIS — R1310 Dysphagia, unspecified: Secondary | ICD-10-CM | POA: Diagnosis not present

## 2020-12-22 DIAGNOSIS — R000 Tachycardia, unspecified: Secondary | ICD-10-CM | POA: Insufficient documentation

## 2020-12-22 DIAGNOSIS — J449 Chronic obstructive pulmonary disease, unspecified: Secondary | ICD-10-CM | POA: Diagnosis not present

## 2020-12-22 DIAGNOSIS — G35 Multiple sclerosis: Secondary | ICD-10-CM | POA: Insufficient documentation

## 2020-12-22 DIAGNOSIS — E860 Dehydration: Secondary | ICD-10-CM

## 2020-12-22 DIAGNOSIS — F17210 Nicotine dependence, cigarettes, uncomplicated: Secondary | ICD-10-CM | POA: Diagnosis not present

## 2020-12-22 DIAGNOSIS — Z800 Family history of malignant neoplasm of digestive organs: Secondary | ICD-10-CM | POA: Insufficient documentation

## 2020-12-22 DIAGNOSIS — Z5111 Encounter for antineoplastic chemotherapy: Secondary | ICD-10-CM | POA: Diagnosis present

## 2020-12-22 DIAGNOSIS — G893 Neoplasm related pain (acute) (chronic): Secondary | ICD-10-CM | POA: Insufficient documentation

## 2020-12-22 DIAGNOSIS — Z5112 Encounter for antineoplastic immunotherapy: Secondary | ICD-10-CM | POA: Diagnosis present

## 2020-12-22 DIAGNOSIS — K219 Gastro-esophageal reflux disease without esophagitis: Secondary | ICD-10-CM | POA: Insufficient documentation

## 2020-12-22 DIAGNOSIS — J910 Malignant pleural effusion: Secondary | ICD-10-CM | POA: Insufficient documentation

## 2020-12-22 DIAGNOSIS — K208 Other esophagitis without bleeding: Secondary | ICD-10-CM | POA: Diagnosis not present

## 2020-12-22 DIAGNOSIS — C3490 Malignant neoplasm of unspecified part of unspecified bronchus or lung: Secondary | ICD-10-CM | POA: Diagnosis present

## 2020-12-22 DIAGNOSIS — R634 Abnormal weight loss: Secondary | ICD-10-CM | POA: Diagnosis not present

## 2020-12-22 LAB — CBC WITH DIFFERENTIAL/PLATELET
Abs Immature Granulocytes: 0 10*3/uL (ref 0.00–0.07)
Basophils Absolute: 0 10*3/uL (ref 0.0–0.1)
Basophils Relative: 0 %
Eosinophils Absolute: 0 10*3/uL (ref 0.0–0.5)
Eosinophils Relative: 1 %
HCT: 34.5 % — ABNORMAL LOW (ref 36.0–46.0)
Hemoglobin: 11.6 g/dL — ABNORMAL LOW (ref 12.0–15.0)
Immature Granulocytes: 0 %
Lymphocytes Relative: 14 %
Lymphs Abs: 0.5 10*3/uL — ABNORMAL LOW (ref 0.7–4.0)
MCH: 26.1 pg (ref 26.0–34.0)
MCHC: 33.6 g/dL (ref 30.0–36.0)
MCV: 77.5 fL — ABNORMAL LOW (ref 80.0–100.0)
Monocytes Absolute: 0.7 10*3/uL (ref 0.1–1.0)
Monocytes Relative: 21 %
Neutro Abs: 2.1 10*3/uL (ref 1.7–7.7)
Neutrophils Relative %: 64 %
Platelets: 150 10*3/uL (ref 150–400)
RBC: 4.45 MIL/uL (ref 3.87–5.11)
RDW: 16 % — ABNORMAL HIGH (ref 11.5–15.5)
WBC: 3.2 10*3/uL — ABNORMAL LOW (ref 4.0–10.5)
nRBC: 0 % (ref 0.0–0.2)

## 2020-12-22 LAB — COMPREHENSIVE METABOLIC PANEL
ALT: 19 U/L (ref 0–44)
AST: 18 U/L (ref 15–41)
Albumin: 2.9 g/dL — ABNORMAL LOW (ref 3.5–5.0)
Alkaline Phosphatase: 80 U/L (ref 38–126)
Anion gap: 12 (ref 5–15)
BUN: 18 mg/dL (ref 8–23)
CO2: 26 mmol/L (ref 22–32)
Calcium: 8.7 mg/dL — ABNORMAL LOW (ref 8.9–10.3)
Chloride: 94 mmol/L — ABNORMAL LOW (ref 98–111)
Creatinine, Ser: 0.76 mg/dL (ref 0.44–1.00)
GFR, Estimated: >60 mL/min (ref >60)
Glucose, Bld: 113 mg/dL — ABNORMAL HIGH (ref 70–99)
Potassium: 4.1 mmol/L (ref 3.5–5.1)
Sodium: 132 mmol/L — ABNORMAL LOW (ref 135–145)
Total Bilirubin: 0.2 mg/dL — ABNORMAL LOW (ref 0.3–1.2)
Total Protein: 7.3 g/dL (ref 6.5–8.1)

## 2020-12-22 MED ORDER — OXYCODONE HCL 5 MG PO TABS: 5.0000 mg | ORAL_TABLET | ORAL | 0 refills | Status: DC | PRN

## 2020-12-22 MED ORDER — FENTANYL 25 MCG/HR TD PT72: 1.0000 | MEDICATED_PATCH | TRANSDERMAL | 0 refills | Status: DC

## 2020-12-22 MED ORDER — SODIUM CHLORIDE 0.9 % IV SOLN
Freq: Once | INTRAVENOUS | Status: AC
  Filled 2020-12-22: qty 250

## 2020-12-22 NOTE — Progress Notes (Signed)
Patient has sore throat that is painful with swallowing.  Back pain is 8/10 today and last took pain med this morning.

## 2020-12-22 NOTE — Telephone Encounter (Signed)
Prior auth request has been submitted for Fentanyl patch.

## 2020-12-22 NOTE — Progress Notes (Signed)
1q    Hematology/Oncology Follow Up Note [facility]  [phone] [phone]  Patient Care Team: Ranae Plumber, [place] as PCP - General (Family Medicine) Telford Nab, RN as Oncology Nurse Navigator [name], MD as Medical Oncologist (Oncology)   Name of the patient: Brittney Fisher  [id number]  [birth year]   REASON FOR VISIT  follow-up for lung cancer  PERTINENT ONCOLOGY HISTORY 60 y.o. female with past medical history including GERD, COPD, current everyday smoker presents for follow-up of lung mass and pleural effusion. 10/20/2020, patient was brought to ED via EMS due to generalized weakness, dizziness, chest pain shortness of breath.  She was recently diagnosed with COPD approximately 1 month ago by primary care provider.  Also unintentional weight loss during the last few months. Image work-up showed right-sided pleural effusion.  She underwent right thoracentesis.  With removal of 2.4 L of fluid. 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  Metastatic disease to low cervical/thoracic nodes, lung, right pleural space and bones.  Moderate to large right pleural effusion.  SVC narrowing. 10/21/2020 MRI brain is negative for metastasis.  Mild chronic microvascular ischemic changes in the white matter.  Negative for acute infarct Regarding to the SVC narrowing, patient was seen by vascular surgeon and was recommended no intervention inpatient.  Patient to follow-up outpatient with vascular surgeon for evaluation. Patient also was treated for COPD exacerbation with hypoxia on exertion. Qualifies for home oxygen and hospitalist arrange home health and a nebulizer.  Patient was given a course of prednisone taper and empiric Levaquin. Patient was discharged and present today to follow-up with cytology results and further management plan  10/27/2020 PET scan showed right  upper lobe primary bronchogenic mass with direct invasion into mediastinum. Metastatic disease to the right pleural space, lung, bone, nodes of the chest and less so lower leg/upper abdomen. Hypermetabolic him along the course of the right axillary vein with concurrent subtle hyper attenuation within the axillary vein and SVC.  This continues to the level of SVC narrowing.  Suspicious for SVC occlusion and developing thrombus. Moderate right pleural effusion and small pericardial effusion.  # # left supraclavicular mass biopsy- pathology is positive for adenocarcinoma. positive for CK7, with patchy, weak CK20. They are  negative for TTF1, NapsinA, GATA3, CDX2, and Pax8. The findings are  nonspecific; but may be compatible with a poorly differentiated  adenocarcinoma of lung origin, especially given the imaging findings  She also had another repeat thoracentesis and fluid cytology showed malignancy.  #NGS showed AXIN 1 [id number], DIS2 M165f, KRAS G12C, PRDM1 M1 [id number], [id number] MS stable, TPS <1%   tarted palliative RT since 11/14/2020. INTERVAL HISTORY Brittney Fisher a 60y.o. female who has above history reviewed by me today presents for follow up visit for management of metastatic lung cancer, acute visit for swallowing pain.  Problems and complaints are listed below: Patient finishes RT last week.  This week, she has experienced worsening of swelling and pain, heartburn. She also brings her back pain is 7 out of 10.  Patient has oxycodone 5 mg tablets.  MS Contin was prescribed during last visit and patient does not want to take it Decreased appetite and oral intake.  Lost 6 pounds  No fever, chills, nausea vomiting.  She was accompanied by her daughter. She has not filled the prescription of Magic mouthwash.  Review of Systems  Constitutional: Positive for appetite change, fatigue and  unexpected weight change. Negative for chills and fever.  HENT:   Negative for hearing  loss and voice change.        Odynophagia  Eyes: Negative for eye problems.  Respiratory: Negative for chest tightness, cough and shortness of breath.   Cardiovascular: Negative for chest pain.  Gastrointestinal: Negative for abdominal distention, abdominal pain and blood in stool.  Endocrine: Negative for hot flashes.  Genitourinary: Negative for difficulty urinating and frequency.   Musculoskeletal: Negative for arthralgias.  Skin: Negative for itching and rash.  Neurological: Negative for extremity weakness.  Hematological: Negative for adenopathy.  Psychiatric/Behavioral: Negative for confusion.      No Known Allergies   Past Medical History:  Diagnosis Date  . COPD (chronic obstructive pulmonary disease) ([place])   . GERD (gastroesophageal reflux disease)   . Metastatic lung cancer (metastasis from lung to other site) [facility]) 11/02/2020     Past Surgical History:  Procedure Laterality Date  . COLONOSCOPY WITH ESOPHAGOGASTRODUODENOSCOPY (EGD)    . COLONOSCOPY WITH PROPOFOL N/A 11/14/2015   Procedure: COLONOSCOPY WITH PROPOFOL;  Surgeon: Hulen Luster, MD;  Location: [facility] ENDOSCOPY;  Service: Gastroenterology;  Laterality: N/A;  . ESOPHAGOGASTRODUODENOSCOPY (EGD) WITH PROPOFOL N/A 11/14/2015   Procedure: ESOPHAGOGASTRODUODENOSCOPY (EGD) WITH PROPOFOL;  Surgeon: Hulen Luster, MD;  Location: [facility] ENDOSCOPY;  Service: Gastroenterology;  Laterality: N/A;    Social History   Socioeconomic History  . Marital status: Married    Spouse name: Not on file  . Number of children: Not on file  . Years of education: Not on file  . Highest education level: Not on file  Occupational History  . Not on file  Tobacco Use  . Smoking status: Current Every Day Smoker    Packs/day: 0.20    Types: Cigarettes  . Smokeless tobacco: Never Used  Substance and Sexual Activity  . Alcohol use: Never  . Drug use: Never  . Sexual activity: Not Currently  Other Topics Concern  . Not on file  Social  History Narrative  . Not on file   Social Determinants of Health   Financial Resource Strain: Not on file  Food Insecurity: Not on file  Transportation Needs: Not on file  Physical Activity: Not on file  Stress: Not on file  Social Connections: Not on file  Intimate Partner Violence: Not on file    Family History  Problem Relation Age of Onset  . Breast cancer Cousin 9  . Diabetes type II Mother   . Hypertension Mother   . Colon cancer Father   . Hypertension Father      Current Outpatient Medications:  .  acetaminophen (TYLENOL) 500 MG tablet, Take 1,000 mg by mouth every 8 (eight) hours as needed for moderate pain. , Disp: , Rfl:  .  benzonatate (TESSALON) 100 MG capsule, Take 1 capsule (100 mg total) by mouth 3 (three) times daily as needed for cough., Disp: 60 capsule, Rfl: 1 .  dexamethasone (DECADRON) 4 MG tablet, Take 1 tab (4 mg) twice a day the day before Alimta chemo. Take 2 tablets (8 mg) po daily x 3 days starting the day after chemo., Disp: 30 tablet, Rfl: 1 .  feeding supplement (ENSURE ENLIVE / ENSURE PLUS) LIQD, Take 237 mLs by mouth 2 (two) times daily between meals., Disp: 237 mL, Rfl: 12 .  folic acid (FOLVITE) 1 MG tablet, Take 1 tablet (1 mg total) by mouth daily. Start 5-7 days before Alimta chemotherapy. Continue until 21 days after Alimta completed.,  Disp: 100 tablet, Rfl: 3 .  guaiFENesin (MUCINEX) 600 MG 12 hr tablet, Take 1 tablet (600 mg total) by mouth 2 (two) times daily., Disp: 14 tablet, Rfl: 0 .  ipratropium-albuterol (DUONEB) 0.5-2.5 (3) MG/3ML SOLN, Take 3 mLs by nebulization 3 (three) times daily., Disp: 360 mL, Rfl: 1 .  lidocaine-prilocaine (EMLA) cream, Apply to affected area once (Patient not taking: No sig reported), Disp: 30 g, Rfl: 3 .  magic mouthwash w/lidocaine SOLN, Take 5 mLs by mouth 4 (four) times daily as needed for mouth pain. Sig: Swish/Swallow 5-10 ml four times a day as needed. Dispense 480 ml. 1RF, Disp: 480 mL, Rfl: 1 .   morphine (MS CONTIN) 15 MG 12 hr tablet, Take 1 tablet (15 mg total) by mouth every 12 (twelve) hours. (Patient not taking: Reported on 12/12/2020), Disp: 28 tablet, Rfl: 0 .  omeprazole (PRILOSEC) 20 MG capsule, Take 1 capsule (20 mg total) by mouth 2 (two) times daily before a meal., Disp: 60 capsule, Rfl: 1 .  ondansetron (ZOFRAN) 8 MG tablet, Take 1 tablet (8 mg total) by mouth 2 (two) times daily as needed for refractory nausea / vomiting. Start on day 3 after chemo., Disp: 30 tablet, Rfl: 1 .  oxyCODONE (OXY IR/ROXICODONE) 5 MG immediate release tablet, Take 1 tablet (5 mg total) by mouth every 3 (three) hours as needed for severe pain., Disp: 60 tablet, Rfl: 0 .  Polyethylene Glycol 3350 (MIRALAX PO), Take by mouth daily as needed., Disp: , Rfl:  .  prochlorperazine (COMPAZINE) 10 MG tablet, Take 1 tablet (10 mg total) by mouth every 6 (six) hours as needed (Nausea or vomiting)., Disp: 30 tablet, Rfl: 1 .  senna-docusate (SENOKOT-S) 8.6-50 MG tablet, Take 2 tablets by mouth daily., Disp: 60 tablet, Rfl: 3 .  sucralfate (CARAFATE) 1 GM/10ML suspension, Take 10 mLs (1 g total) by mouth in the morning, at noon, and at bedtime., Disp: 420 mL, Rfl: 0 .  tiotropium (SPIRIVA HANDIHALER) 18 MCG inhalation capsule, Place 18 mcg into inhaler and inhale daily., Disp: , Rfl:   Physical exam: ECOG 2 Vitals:   12/22/20 1147  BP: 113/78  Pulse: (!) 129  Resp: 20  Temp: 97.9 F (36.6 C)  Weight: 123 lb 14.4 oz (56.2 kg)   Physical Exam Constitutional:      General: She is not in acute distress.    Comments: Thin built female walks independently  HENT:     Head: Normocephalic and atraumatic.  Eyes:     General: No scleral icterus. Cardiovascular:     Rate and Rhythm: Regular rhythm. Tachycardia present.     Heart sounds: Normal heart sounds.  Pulmonary:     Effort: Pulmonary effort is normal. No respiratory distress.     Breath sounds: No wheezing.     Comments: Decreased breath sounds in  right lower lung Abdominal:     General: Bowel sounds are normal. There is no distension.     Palpations: Abdomen is soft.  Musculoskeletal:        General: No deformity. Normal range of motion.     Cervical back: Normal range of motion and neck supple.  Skin:    General: Skin is warm and dry.     Findings: No erythema or rash.  Neurological:     Mental Status: She is alert and oriented to person, place, and time. Mental status is at baseline.     Cranial Nerves: No cranial nerve deficit.  Coordination: Coordination normal.  Psychiatric:        Mood and Affect: Mood normal.     CMP Latest Ref Rng & Units 12/12/2020  Glucose 70 - 99 mg/dL 105(H)  BUN 8 - 23 mg/dL 15  Creatinine 0.44 - 1.00 mg/dL 0.56  Sodium 135 - 145 mmol/L 135  Potassium 3.5 - 5.1 mmol/L 3.9  Chloride 98 - 111 mmol/L 99  CO2 22 - 32 mmol/L 26  Calcium 8.9 - 10.3 mg/dL 8.8(L)  Total Protein 6.5 - 8.1 g/dL 7.4  Total Bilirubin 0.3 - 1.2 mg/dL 0.4  Alkaline Phos 38 - 126 U/L 83  AST 15 - 41 U/L 21  ALT 0 - 44 U/L 16   CBC Latest Ref Rng & Units 12/22/2020  WBC 4.0 - 10.5 K/uL 3.2(L)  Hemoglobin 12.0 - 15.0 g/dL 11.6(L)  Hematocrit 36.0 - 46.0 % 34.5(L)  Platelets 150 - 400 K/uL 150    RADIOGRAPHIC STUDIES: I have personally reviewed the radiological images as listed and agreed with the findings in the report. No results found.   Assessment and plan  1. 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  cT4 N3 M1-  KRASG12C (Lumakras in subsequent line can be considered) Chemotherapy  carboplatin and Alimta, bevacizumab  #Radiation esophagitis, Continues sucralfate, omeprazole. Recommend Magic mouthwash swish and swallow prior to  meals.  #Neoplasm related pain/treatment complication Continue oxycodone 5 mg every 4 hours as needed.  Discussed about option of liquid morphine and patient declined. Recommend patient to try fentanyl patch 25 MCG  every 72 hours.  #Poor oral intake, weight loss secondary to odynophagia. Patient will receive 1 L of IV normal saline today. Offered patient to have IV saline hydration tomorrow and patient declined.  #Bronchial obstruction and SVC occlusion.  s/p palliative radiation #Tachycardic, chronic.  Likely due to underlying cancer, poor oral intake. #Anemia, likely secondary to chemotherapy radiation and underlying malignancy.  Hemoglobin stable  #Right pleural effusion, stable. Keep current follow-up appointment.  Patient knows to call clinic if symptoms are not improving.  [name], MD, PhD Hematology Oncology [facility] at [facility] Pager- [phone] 12/22/2020

## 2020-12-22 NOTE — Telephone Encounter (Signed)
PA for Fentanyl approved through 12/22/21 [id number]

## 2020-12-22 NOTE — Progress Notes (Signed)
Pt was an add on visit with Dr [name]. Received 1 liter of NS per MD orders. VSS. No complaints at discharge. Ambulatory.

## 2020-12-22 NOTE — Telephone Encounter (Signed)
Call from [place] reproting that Oxycodone did not get sent to [facility] on [address]. I see that it was signed as a No Print. She also reports that her Fentanyl needs a prior authorization done. Please return her call

## 2020-12-27 ENCOUNTER — Other Ambulatory Visit: Payer: Self-pay

## 2020-12-27 ENCOUNTER — Encounter (INDEPENDENT_AMBULATORY_CARE_PROVIDER_SITE_OTHER): Payer: Self-pay | Admitting: Vascular Surgery

## 2020-12-27 ENCOUNTER — Ambulatory Visit (INDEPENDENT_AMBULATORY_CARE_PROVIDER_SITE_OTHER): Payer: BC Managed Care – PPO | Admitting: Vascular Surgery

## 2020-12-27 VITALS — BP 99/67 | HR 116 | Resp 16 | Wt 125.2 lb

## 2020-12-27 DIAGNOSIS — C3490 Malignant neoplasm of unspecified part of unspecified bronchus or lung: Secondary | ICD-10-CM

## 2020-12-27 DIAGNOSIS — I871 Compression of vein: Secondary | ICD-10-CM | POA: Insufficient documentation

## 2020-12-27 NOTE — Assessment & Plan Note (Signed)
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  Currently undergoing chemotherapy.  Follows with oncology.

## 2020-12-27 NOTE — Assessment & Plan Note (Signed)
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  She does not have any arm, head or neck swelling.  She does have some prominent varicosities across her chest which is not surprising.  She has gotten 2 chemotherapy treatments and is scheduled for another one within a week.  She only has 5 scheduled.  They are doing these peripherally.  I would not plan any intervention for her SVC syndrome at this time as she has no symptoms and she has a very limited course of chemotherapy remaining.  If she were to run out of peripheral veins, I would consider some sort of PICC line or other access although port could still be considered.  At this point, if she does not have any further symptoms I am not going to plan to see her back but will be happy to help in any way I can going forward as needed

## 2020-12-27 NOTE — Progress Notes (Signed)
MRN : [id number]  Brittney Fisher is a 60 y.o. ([birth year]) female who presents with chief complaint of  Chief Complaint  Patient presents with  . Follow-up    [facility] 77monthpost ED,SVC syndrome   .  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  This was seen on a CT scan back in November.  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  Despite this, the patient denies any arm, neck, or head swelling.  She has pain.  She is scheduled to get her third chemotherapy treatment peripherally in less than a week.  So far, these have gone well.  She has chronic shortness of breath which seems a little worse but no worsening of her extremity symptoms.  I have independently reviewed her CT scan while she was in the hospital.  She has not had another CT of the chest that I see since that time.  Current Outpatient Medications  Medication Sig Dispense Refill  . acetaminophen (TYLENOL) 500 MG tablet Take 1,000 mg by mouth every 8 (eight) hours as needed for moderate pain.     . benzonatate (TESSALON) 100 MG capsule Take 1 capsule (100 mg total) by mouth 3 (three) times daily as needed for cough. 60 capsule 1  . dexamethasone (DECADRON) 4 MG tablet Take 1 tab (4 mg) twice a day the day before Alimta chemo. Take 2 tablets (8 mg) po daily x 3 days starting the day after chemo. 30 tablet 1  . feeding supplement (ENSURE ENLIVE / ENSURE PLUS) LIQD Take 237 mLs by mouth 2 (two) times daily between meals. 237 mL 12  . fentaNYL (DURAGESIC) 25 MCG/HR Place 1 patch onto the skin every 3 (three) days. 2 patch 0  . folic acid (FOLVITE) 1 MG tablet Take 1 tablet (1 mg total) by mouth daily. Start 5-7 days before Alimta chemotherapy. Continue until 21 days after Alimta completed. 100 tablet 3  . guaiFENesin (MUCINEX) 600 MG 12 hr tablet Take 1 tablet (600 mg total) by mouth 2 (two) times daily. 14 tablet 0  .  ipratropium-albuterol (DUONEB) 0.5-2.5 (3) MG/3ML SOLN Take 3 mLs by nebulization 3 (three) times daily. 360 mL 1  . magic mouthwash w/lidocaine SOLN Take 5 mLs by mouth 4 (four) times daily as needed for mouth pain. Sig: Swish/Swallow 5-10 ml four times a day as needed. Dispense 480 ml. 1RF 480 mL 1  . omeprazole (PRILOSEC) 20 MG capsule Take 1 capsule (20 mg total) by mouth 2 (two) times daily before a meal. 60 capsule 1  . ondansetron (ZOFRAN) 8 MG tablet Take 1 tablet (8 mg total) by mouth 2 (two) times daily as needed for refractory nausea / vomiting. Start on day 3 after chemo. 30 tablet 1  . oxyCODONE (OXY IR/ROXICODONE) 5 MG immediate release tablet Take 1 tablet (5 mg total) by mouth every 4 (four) hours as needed for severe pain. 60 tablet 0  . Polyethylene Glycol 3350 (MIRALAX PO) Take by mouth daily as needed.    . prochlorperazine (COMPAZINE) 10 MG tablet Take 1 tablet (10 mg total) by mouth every 6 (six) hours as needed (Nausea or vomiting). 30 tablet 1  . senna-docusate (SENOKOT-S) 8.6-50 MG tablet Take 2 tablets by mouth daily. 60 tablet 3  . sucralfate (CARAFATE) 1 GM/10ML suspension Take 10 mLs (1 g total) by mouth in the morning, at noon, and at  bedtime. 420 mL 0  . tiotropium (SPIRIVA HANDIHALER) 18 MCG inhalation capsule Place 18 mcg into inhaler and inhale daily.    Marland Kitchen lidocaine-prilocaine (EMLA) cream Apply to affected area once (Patient not taking: No sig reported) 30 g 3   No current facility-administered medications for this visit.    Past Medical History:  Diagnosis Date  . COPD (chronic obstructive pulmonary disease) ([place])   . GERD (gastroesophageal reflux disease)   . Metastatic lung cancer (metastasis from lung to other site) [facility]) 11/02/2020    Past Surgical History:  Procedure Laterality Date  . COLONOSCOPY WITH ESOPHAGOGASTRODUODENOSCOPY (EGD)    . COLONOSCOPY WITH PROPOFOL N/A 11/14/2015   Procedure: COLONOSCOPY WITH PROPOFOL;  Surgeon: Hulen Luster, MD;   Location: [facility] ENDOSCOPY;  Service: Gastroenterology;  Laterality: N/A;  . ESOPHAGOGASTRODUODENOSCOPY (EGD) WITH PROPOFOL N/A 11/14/2015   Procedure: ESOPHAGOGASTRODUODENOSCOPY (EGD) WITH PROPOFOL;  Surgeon: Hulen Luster, MD;  Location: [facility] ENDOSCOPY;  Service: Gastroenterology;  Laterality: N/A;     Social History   Tobacco Use  . Smoking status: Current Every Day Smoker    Packs/day: 0.20    Types: Cigarettes  . Smokeless tobacco: Never Used  Substance Use Topics  . Alcohol use: Never  . Drug use: Never      Family History  Problem Relation Age of Onset  . Breast cancer Cousin 60  . Diabetes type II Mother   . Hypertension Mother   . Colon cancer Father   . Hypertension Father     No Known Allergies   REVIEW OF SYSTEMS (Negative unless checked)  Constitutional: []Weight loss  []Fever  []Chills Cardiac: [x]Chest pain   [x]Chest pressure   []Palpitations   []Shortness of breath when laying flat   [x]Shortness of breath at rest   [x]Shortness of breath with exertion. Vascular:  []Pain in legs with walking   []Pain in legs at rest   []Pain in legs when laying flat   []Claudication   []Pain in feet when walking  []Pain in feet at rest  []Pain in feet when laying flat   []History of DVT   []Phlebitis   [x]Swelling in legs   []Varicose veins   []Non-healing ulcers Pulmonary:   []Uses home oxygen   []Productive cough   []Hemoptysis   []Wheeze  []COPD   []Asthma Neurologic:  []Dizziness  []Blackouts   []Seizures   []History of stroke   []History of TIA  []Aphasia   []Temporary blindness   []Dysphagia   []Weakness or numbness in arms   []Weakness or numbness in legs Musculoskeletal:  [x]Arthritis   []Joint swelling   [x]Joint pain   []Low back pain Hematologic:  []Easy bruising  []Easy bleeding   []Hypercoagulable state   []Anemic   Gastrointestinal:  []Blood in stool   []Vomiting blood  []Gastroesophageal reflux/heartburn   []Abdominal pain Genitourinary:  []Chronic kidney disease    []Difficult urination  []Frequent urination  []Burning with urination   []Hematuria Skin:  []Rashes   []Ulcers   []Wounds Psychological:  []History of anxiety   [] History of major depression.  Physical Examination  BP 99/67 (BP Location: Left Arm)   Pulse (!) 116   Resp 16   Wt 125 lb 3.2 oz (56.8 kg)   BMI 17.46 kg/m  Gen: Cachectic and frail, NAD Head: [place]/AT, + temporalis wasting. Ear/Nose/Throat: Hearing grossly intact, nares w/o erythema or drainage Eyes: Conjunctiva clear. Sclera non-icteric Neck: Supple.  Trachea midline Pulmonary:  Good air movement, no use of  accessory muscles.  Cardiac: RRR, no JVD.  Does have some superficial varicosities present throughout the chest and neck area. Vascular:  Vessel Right Left  Radial Palpable Palpable       Musculoskeletal: M/S 5/5 throughout.  No deformity or atrophy.  No upper extremity edema. Neurologic: Sensation grossly intact in extremities.  Symmetrical.  Speech is fluent.  Psychiatric: Judgment intact, Mood & affect appropriate for pt's clinical situation. Dermatologic: No rashes or ulcers noted.  No cellulitis or open wounds.       Labs Recent Results (from the past 2160 hour(s))  Urinalysis, Complete w Microscopic Urine, Clean Catch     Status: Abnormal   Collection Time: 10/19/20  2:04 AM  Result Value Ref Range   Color, Urine YELLOW (A) YELLOW   APPearance HAZY (A) CLEAR   Specific Gravity, Urine 1.028 1.005 - 1.030   pH 5.0 5.0 - 8.0   Glucose, UA NEGATIVE NEGATIVE mg/dL   Hgb urine dipstick NEGATIVE NEGATIVE   Bilirubin Urine NEGATIVE NEGATIVE   Ketones, ur 20 (A) NEGATIVE mg/dL   Protein, ur NEGATIVE NEGATIVE mg/dL   Nitrite NEGATIVE NEGATIVE   Leukocytes,Ua NEGATIVE NEGATIVE   WBC, UA 0-5 0 - 5 WBC/hpf   Bacteria, UA NONE SEEN NONE SEEN   Squamous Epithelial / LPF 0-5 0 - 5   Mucus PRESENT    Hyaline Casts, UA PRESENT     Comment: Performed at [facility], [address], [place],  [place] [address]  Basic metabolic panel     Status: Abnormal   Collection Time: 10/19/20 10:44 PM  Result Value Ref Range   Sodium 134 (L) 135 - 145 mmol/L   Potassium 3.6 3.5 - 5.1 mmol/L   Chloride 97 (L) 98 - 111 mmol/L   CO2 26 22 - 32 mmol/L   Glucose, Bld 138 (H) 70 - 99 mg/dL    Comment: Glucose reference range applies only to samples taken after fasting for at least 8 hours.   BUN 18 8 - 23 mg/dL   Creatinine, Ser 0.81 0.44 - 1.00 mg/dL   Calcium 8.6 (L) 8.9 - 10.3 mg/dL   GFR, Estimated >60 >60 mL/min    Comment: (NOTE) Calculated using the CKD-EPI Creatinine Equation (2021)    Anion gap 11 5 - 15    Comment: Performed at [facility], [address] [place], [place] [address]  CBC     Status: Abnormal   Collection Time: 10/19/20 10:44 PM  Result Value Ref Range   WBC 13.5 (H) 4.0 - 10.5 K/uL   RBC 4.74 3.87 - 5.11 MIL/uL   Hemoglobin 12.7 12.0 - 15.0 g/dL   HCT 38.0 36.0 - 46.0 %   MCV 80.2 80.0 - 100.0 fL   MCH 26.8 26.0 - 34.0 pg   MCHC 33.4 30.0 - 36.0 g/dL   RDW 13.9 11.5 - 15.5 %   Platelets 276 150 - 400 K/uL   nRBC 0.0 0.0 - 0.2 %    Comment: Performed at [facility], [place], [place] [address]  Troponin I (High Sensitivity)     Status: None   Collection Time: 10/19/20 10:44 PM  Result Value Ref Range   Troponin I (High Sensitivity) 6 <18 ng/L    Comment: (NOTE) Elevated high sensitivity troponin I (hsTnI) values and significant  changes across serial measurements may suggest ACS but many other  chronic and acute conditions are known to elevate hsTnI results.  Refer to the "  Links" section for chest pain algorithms and additional  guidance. Performed at [facility], [place], [place] [address]   Troponin I (High Sensitivity)     Status: None   Collection Time: 10/20/20  1:57 AM  Result Value Ref Range   Troponin I (High Sensitivity) 7 <18 ng/L    Comment: (NOTE) Elevated high sensitivity  troponin I (hsTnI) values and significant  changes across serial measurements may suggest ACS but many other  chronic and acute conditions are known to elevate hsTnI results.  Refer to the "Links" section for chest pain algorithms and additional  guidance. Performed at [facility], [address] [place], [place] [address]   Procalcitonin - Baseline     Status: None   Collection Time: 10/20/20  1:57 AM  Result Value Ref Range   Procalcitonin <0.10 ng/mL    Comment:        Interpretation: PCT (Procalcitonin) <= 0.5 ng/mL: Systemic infection (sepsis) is not likely. Local bacterial infection is possible. (NOTE)       Sepsis PCT Algorithm           Lower Respiratory Tract                                      Infection PCT Algorithm    ----------------------------     ----------------------------         PCT < 0.25 ng/mL                PCT < 0.10 ng/mL          Strongly encourage             Strongly discourage   discontinuation of antibiotics    initiation of antibiotics    ----------------------------     -----------------------------       PCT 0.25 - 0.50 ng/mL            PCT 0.10 - 0.25 ng/mL               OR       >80% decrease in PCT            Discourage initiation of                                            antibiotics      Encourage discontinuation           of antibiotics    ----------------------------     -----------------------------         PCT >= 0.50 ng/mL              PCT 0.26 - 0.50 ng/mL               AND        <80% decrease in PCT             Encourage initiation of                                             antibiotics       Encourage continuation           of antibiotics    ----------------------------     -----------------------------  PCT >= 0.50 ng/mL                  PCT > 0.50 ng/mL               AND         increase in PCT                  Strongly encourage                                      initiation of antibiotics     Strongly encourage escalation           of antibiotics                                     -----------------------------                                           PCT <= 0.25 ng/mL                                                 OR                                        > 80% decrease in PCT                                      Discontinue / Do not initiate                                             antibiotics  Performed at [facility], [address] [place], [place] [address]   Respiratory Panel by RT PCR (Flu A&B, Covid) - Nasopharyngeal Swab     Status: None   Collection Time: 10/20/20  5:00 AM   Specimen: Nasopharyngeal Swab  Result Value Ref Range   SARS Coronavirus 2 by RT PCR NEGATIVE NEGATIVE    Comment: (NOTE) SARS-CoV-2 target nucleic acids are NOT DETECTED.  The SARS-CoV-2 RNA is generally detectable in upper respiratoy specimens during the acute phase of infection. The lowest concentration of SARS-CoV-2 viral copies this assay can detect is 131 copies/mL. A negative result does not preclude SARS-Cov-2 infection and should not be used as the sole basis for treatment or other patient management decisions. A negative result may occur with  improper specimen collection/handling, submission of specimen other than nasopharyngeal swab, presence of viral mutation(s) within the areas targeted by this assay, and inadequate number of viral copies (<131 copies/mL). A negative result must be combined with clinical observations, patient history, and epidemiological information. The expected result is Negative.  Fact Sheet for Patients:  PinkCheek.be  Fact Sheet for Healthcare Providers:  GravelBags.it  This test is no t yet approved or cleared by  the [employer] and  has been authorized for detection and/or diagnosis of SARS-CoV-2 by FDA under an Emergency Use Authorization (EUA). This EUA will  remain  in effect (meaning this test can be used) for the duration of the COVID-19 declaration under Section 564(b)(1) of the Act, 21 U.S.C. section 360bbb-3(b)(1), unless the authorization is terminated or revoked sooner.     Influenza A by PCR NEGATIVE NEGATIVE   Influenza B by PCR NEGATIVE NEGATIVE    Comment: (NOTE) The Xpert Xpress SARS-CoV-2/FLU/RSV assay is intended as an aid in  the diagnosis of influenza from Nasopharyngeal swab specimens and  should not be used as a sole basis for treatment. Nasal washings and  aspirates are unacceptable for Xpert Xpress SARS-CoV-2/FLU/RSV  testing.  Fact Sheet for Patients: PinkCheek.be  Fact Sheet for Healthcare Providers: GravelBags.it  This test is not yet approved or cleared by the [place] FDA and  has been authorized for detection and/or diagnosis of SARS-CoV-2 by  FDA under an Emergency Use Authorization (EUA). This EUA will remain  in effect (meaning this test can be used) for the duration of the  Covid-19 declaration under Section 564(b)(1) of the Act, 21  U.S.C. section 360bbb-3(b)(1), unless the authorization is  terminated or revoked. Performed at [facility], [address] [place], [place] [address]   Culture, blood (routine x 2)     Status: None   Collection Time: 10/20/20  5:00 AM   Specimen: BLOOD  Result Value Ref Range   Specimen Description BLOOD LEFT ANTECUBITAL    Special Requests      BOTTLES DRAWN AEROBIC AND ANAEROBIC Blood Culture adequate volume   Culture      NO GROWTH 5 DAYS Performed at [facility], [address] [place], [place] [address]    Report Status 10/25/2020 FINAL   Culture, blood (routine x 2)     Status: None   Collection Time: 10/20/20  5:00 AM   Specimen: BLOOD  Result Value Ref Range   Specimen Description BLOOD RIGHT ANTECUBITAL    Special Requests      BOTTLES DRAWN AEROBIC AND ANAEROBIC  Blood Culture results may not be optimal due to an excessive volume of blood received in culture bottles   Culture      NO GROWTH 5 DAYS Performed at [facility], [address], [place], [place] [address]    Report Status 10/25/2020 FINAL   Lactic acid, plasma     Status: None   Collection Time: 10/20/20  5:00 AM  Result Value Ref Range   Lactic Acid, Venous 1.4 0.5 - 1.9 mmol/L    Comment: Performed at [facility], [address], [place], [place] [address]  Lactate dehydrogenase (pleural or peritoneal fluid)     Status: Abnormal   Collection Time: 10/20/20 11:40 AM  Result Value Ref Range   LD, Fluid 187 (H) 3 - 23 U/L    Comment: (NOTE) Results should be evaluated in conjunction with serum values    Fluid Type-FLDH CYTO PERI     Comment: Performed at [facility], [address] [place],  [address]  Body fluid cell count with differential     Status: Abnormal   Collection Time: 10/20/20 11:40 AM  Result Value Ref Range   Fluid Type-FCT CYTO PERI    Color, Fluid YELLOW YELLOW   Appearance, Fluid HAZY (A) CLEAR   Total Nucleated Cell Count, Fluid 1,269 cu mm    Comment:  CORRECTED ON 11/04 AT 1334: PREVIOUSLY REPORTED AS 164   Neutrophil Count, Fluid 7 %    Comment: CORRECTED ON 11/04 AT 1334: PREVIOUSLY REPORTED AS 39   Lymphs, Fluid 75 %    Comment: CORRECTED ON 11/04 AT 1334: PREVIOUSLY REPORTED AS 37   Monocyte-Macrophage-Serous Fluid 18 %    Comment: Performed at [facility], [address] [place], [place] [address] CORRECTED ON 11/04 AT 4081: PREVIOUSLY REPORTED AS 24   Body fluid culture     Status: None   Collection Time: 10/20/20 11:40 AM   Specimen: PATH Cytology Peritoneal fluid  Result Value Ref Range   Specimen Description      PERITONEAL Performed at [facility], [address], [place], [place] [address]    Special Requests      NONE Performed at [facility], [place], [place] [address]    Gram Stain NO WBC SEEN NO ORGANISMS SEEN     Culture      NO GROWTH 3 DAYS Performed at [facility], [place] [address] [place], [place] [address]    Report Status 10/23/2020 FINAL   Protein, pleural or peritoneal fluid     Status: None   Collection Time: 10/20/20 11:40 AM  Result Value Ref Range   Total protein, fluid 4.6 g/dL    Comment: (NOTE) No normal range established for this test Results should be evaluated in conjunction with serum values    Fluid Type-FTP CYTO PERI     Comment: Performed at [facility], [address] [place], [place] [address]  Glucose, pleural or peritoneal fluid     Status: None   Collection Time: 10/20/20 11:40 AM  Result Value Ref Range   Glucose, Fluid 75 mg/dL    Comment: (NOTE) No normal range established for this test Results should be evaluated in conjunction with serum values    Fluid Type-FGLU CYTO PERI     Comment: Performed at [facility], [address] [place], [place] [address]  [facility], Body Fluid     Status: None   Collection Time: 10/20/20 11:40 AM  Result Value Ref Range   pH, Body Fluid 7.4 Not Estab.    Comment: (NOTE) This test was developed and its performance characteristics determined by [facility]. It has not been cleared or approved by the Food and Drug Administration. The reference interval(s) and other method performance specifications have not been established for this body fluid. The test result must be integrated into the clinical context for interpretation. Performed At: [facility] [place], [place] [id number] Rush Farmer MD EH:[id number]    Source PERITONEAL     Comment: Performed at [facility], [address] [place], [place] [address]  Acid Fast Culture with reflexed sensitivities     Status: None   Collection Time: 10/20/20 11:40 AM   Specimen: PATH Cytology Peritoneal fluid  Result Value Ref Range   Acid Fast Culture  Negative     Comment: (NOTE) No acid fast bacilli isolated after 6 weeks. Performed At: [facility] [place], [place] [id number] Rush Farmer MD TW:[id number]    Source of Sample PERITONEAL     Comment: Performed at [facility], [address] [place], [place] [address]  Acid Fast Smear (AFB)     Status: None   Collection Time: 10/20/20 11:40 AM   Specimen: PATH Cytology Peritoneal fluid  Result Value Ref Range  AFB Specimen Processing Concentration    Acid Fast Smear Negative     Comment: (NOTE) Performed At: [facility] [place], [place] [id number] Rush Farmer MD ZC:[id number]    Source (AFB) PERITONEAL     Comment: Performed at [facility], [address] [place], [place] [address]  Cytology - Non PAP;     Status: None   Collection Time: 10/20/20 12:03 PM  Result Value Ref Range   CYTOLOGY - NON GYN      CYTOLOGY - NON PAP CASE: [id number] PATIENT: Riki Altes Non-Gynecological Cytology Report     Specimen Submitted: A. Pleural fluid, right  Clinical History: Right upper lobe mass, lymphadenopathy, bilateral lung nodules, bone lesions, right pleural effusion. No previous malignancy.     DIAGNOSIS: A. PLEURAL FLUID, RIGHT; ULTRASOUND-GUIDED THORACENTESIS: - NEGATIVE FOR CARCINOMA IN THIS SAMPLE. - LYMPHOCYTE PREDOMINANT EFFUSION.  Comment: There are mostly small lymphocytes, admixed with macrophages, reactive mesothelial cells, and a few neutrophils. Lymphocyte predominant effusion is a non-specific finding that can happen in a long-standing effusion, regardless of etiology.  Total Nucleated Cell Count: 1,269/microL Lymphs: 75% Neutrophils: 7% Monocyte-Macrophage-Mesothelial cell: 18% Absolute neutrophil count: 89/microL  Slides reviewed: 1 cytospin slide, 1 ThinPrep slide, and 1 cell block  The cell block material is fixed in formalin for 6 hou rs prior  to processing.  GROSS DESCRIPTION: A. Labeled: Peritoneal fluid, right Received: Fresh Volume: 1000 mL Description of fluid and container in which it is received: A glass evacuated container with brown-tinged fluid Cytospin slide(s) received: 1  Specimen material submitted for: Cell block and ThinPrep   Final Diagnosis performed by Bryan Lemma, MD.   Electronically signed 10/21/2020 2:55:35PM The electronic signature indicates that the named Attending Pathologist has evaluated the specimen Technical component performed at [facility], [address] [place], [place] [address] Lab: [phone] Dir: Rush Farmer, MD, MMM  Professional component performed at [facility], [facility], [place] [address], [place], [place] [address] Lab: [phone] Dir: Dellia Nims. Rubinas, MD   HIV Antibody (routine testing w rflx)     Status: None   Collection Time: 10/21/20  5:41 AM  Result Value Ref Range   HIV Screen 4th Generation wRfx Non Reactive Non Reactive    Comment: Performed at [facility], [place] [address] [place], [place] [address]  Basic metabolic panel     Status: Abnormal   Collection Time: 10/21/20  5:41 AM  Result Value Ref Range   Sodium 134 (L) 135 - 145 mmol/L   Potassium 3.9 3.5 - 5.1 mmol/L   Chloride 102 98 - 111 mmol/L   CO2 23 22 - 32 mmol/L   Glucose, Bld 101 (H) 70 - 99 mg/dL    Comment: Glucose reference range applies only to samples taken after fasting for at least 8 hours.   BUN 13 8 - 23 mg/dL   Creatinine, Ser 0.76 0.44 - 1.00 mg/dL   Calcium 8.5 (L) 8.9 - 10.3 mg/dL   GFR, Estimated >60 >60 mL/min    Comment: (NOTE) Calculated using the CKD-EPI Creatinine Equation (2021)    Anion gap 9 5 - 15    Comment: Performed at [facility], [address] [place], [place] [address]  CBC     Status: Abnormal   Collection Time: 10/21/20  5:41 AM  Result Value Ref Range   WBC 14.0 (H) 4.0 - 10.5 K/uL   RBC 4.43 3.87 - 5.11 MIL/uL   Hemoglobin  11.9 (L)  12.0 - 15.0 g/dL   HCT 35.3 (L) 36.0 - 46.0 %   MCV 79.7 (L) 80.0 - 100.0 fL   MCH 26.9 26.0 - 34.0 pg   MCHC 33.7 30.0 - 36.0 g/dL   RDW 14.4 11.5 - 15.5 %   Platelets 242 150 - 400 K/uL   nRBC 0.3 (H) 0.0 - 0.2 %    Comment: Performed at [facility], [address] [place], [place] [address]  Hepatic function panel     Status: Abnormal   Collection Time: 10/21/20  5:41 AM  Result Value Ref Range   Total Protein 6.4 (L) 6.5 - 8.1 g/dL   Albumin 2.8 (L) 3.5 - 5.0 g/dL   AST 16 15 - 41 U/L   ALT 17 0 - 44 U/L   Alkaline Phosphatase 62 38 - 126 U/L   Total Bilirubin 0.6 0.3 - 1.2 mg/dL   Bilirubin, Direct <0.1 0.0 - 0.2 mg/dL   Indirect Bilirubin NOT CALCULATED 0.3 - 0.9 mg/dL    Comment: Performed at [facility], [address] [place], [place] [address]  Glucose, capillary     Status: None   Collection Time: 10/27/20  2:13 PM  Result Value Ref Range   Glucose-Capillary 91 70 - 99 mg/dL    Comment: Glucose reference range applies only to samples taken after fasting for at least 8 hours.  SARS CORONAVIRUS 2 (TAT 6-24 HRS) Nasopharyngeal Nasopharyngeal Swab     Status: None   Collection Time: 10/28/20  2:13 PM   Specimen: Nasopharyngeal Swab  Result Value Ref Range   SARS Coronavirus 2 NEGATIVE NEGATIVE    Comment: (NOTE) SARS-CoV-2 target nucleic acids are NOT DETECTED.  The SARS-CoV-2 RNA is generally detectable in upper and lower respiratory specimens during the acute phase of infection. Negative results do not preclude SARS-CoV-2 infection, do not rule out co-infections with other pathogens, and should not be used as the sole basis for treatment or other patient management decisions. Negative results must be combined with clinical observations, patient history, and epidemiological information. The expected result is Negative.  Fact Sheet for Patients: SugarRoll.be  Fact Sheet for Healthcare  Providers: https://www.woods-mathews.com/  This test is not yet approved or cleared by the [place] FDA and  has been authorized for detection and/or diagnosis of SARS-CoV-2 by FDA under an Emergency Use Authorization (EUA). This EUA will remain  in effect (meaning this test can be used) for the duration of the COVID-19 declaration under Se ction 564(b)(1) of the Act, 21 U.S.C. section 360bbb-3(b)(1), unless the authorization is terminated or revoked sooner.  Performed at [facility], [place] [address], [place], [place] [address]   Surgical pathology     Status: None   Collection Time: 10/31/20  8:52 AM  Result Value Ref Range   SURGICAL PATHOLOGY      SURGICAL PATHOLOGY * THIS IS AN ADDENDUM REPORT * CASE: [id number] PATIENT: Riki Altes Surgical Pathology Report **Addendum **  Reason for Addendum #1:  Immunohistochemistry results  Specimen Submitted: A. Right supraclavicular lymph node  Clinical History: Lung cancer, met.      DIAGNOSIS: A. LYMPH NODE, RIGHT SUPRACLAVICULAR; ULTRASOUND-GUIDED BIOPSY: - DIAGNOSTIC OF MALIGNANCY. - METASTATIC ADENOCARCINOMA.  Comment: Immunohistochemical stains to confirm site of origin will be reported in an addendum.  There is adequate tissue in blocks A1 and A2 for ancillary studies.  GROSS DESCRIPTION: A. Labeled: Right supraclavicular lymph node Received: Fresh Tissue fragment(s): 4 Size: 1.4-1.7 cm in length  by 0.1 cm in diameter Description: Pink-white cylindrically shaped tissue fragments, 1 touch prep made Entirely submitted in 2 cassettes.   Final Diagnosis performed by Betsy Pries, MD.   Electronically signed 11/01/2020  11:54:49AM The electronic signature indicates that the named Attending Pathologist has evaluated the specimen Technical component performed at [facility], [address], [place], [place] [address] Lab: [phone] Dir: Rush Farmer, MD, MMM  Professional component  performed at [facility], [facility], [place], [place], [place] [address] Lab: [phone] Dir: Dellia Nims. Rubinas, MD   ADDENDUM: The tumor cells are positive for CK7, with patchy, weak CK20.  They are negative for TTF1, NapsinA, GATA3, CDX2, and Pax8.  The findings are nonspecific; but may be compatible with a poorly differentiated adenocarcinoma of lung origin, especially given the imaging findings. Another diagnostic consideration with this immunoprofile would be pancreaticobiliary.  Clinical correlation is recommended.  IHC slides were prepared by [facility], [place], [place]. All controls stained appropriately.  This test was developed and its perfo rmance characteristics determined by [facility]. It has not been cleared or approved by the [place] Food and Drug Administration. The FDA does not require this test to go through premarket FDA review. This test is used for clinical purposes. It should not be regarded as investigational or for research. This laboratory is certified under the Clinical Laboratory Improvement Amendments (CLIA) as qualified to perform high complexity clinical laboratory testing.         Addendum #1 performed by Betsy Pries, MD.   Electronically signed 11/04/2020 12:33:39PM The electronic signature indicates that the named Attending Pathologist has evaluated the specimen Technical component performed at [facility], [address], [place], [place] [address] Lab: [phone] Dir: Rush Farmer, MD, MMM  Professional component performed at [facility], [facility] [place], [place], [place] [address] Lab: [phone] Dir: Dellia Nims. Rubinas, MD   Cytology - Non PAP; right pleural fluid     Status: None   Collection Time: 10/31/20  8:53 AM  Result Value Ref Range   CYTOLOGY - NON GYN      CYTOLOGY - NON PAP CASE: [id number] PATIENT: Riki Altes Non-Gynecological Cytology  Report     Specimen Submitted: A. Right Pleural fluid  Clinical History: Lung ca, Malig effusion      DIAGNOSIS: A. PLEURAL FLUID, RIGHT; ULTRASOUND-GUIDED THORACENTESIS: - DIAGNOSTIC OF MALIGNANCY. - SEE COMMENT.  Comment: Rare clusters of cells, morphologically similar to those seen in concurrent biopsy [id number], are present in the Cytospin preparation only.  There is insufficient material for further workup.   GROSS DESCRIPTION: A. Labeled: Right pleural fluid Received: Fresh Volume: 1000 mL Description of fluid and container in which it is received: A glass evacuated container with red-brown fluid Cytospin slide(s) received: 2  Specimen material submitted for: Cell block, ThinPrep, Cytospin  The cell block material is fixed in formalin for 6 hours prior to processing.   Final Diagnosis performed by Betsy Pries, MD.   Electronically signed 11/16/ 2021 4:49:00PM The electronic signature indicates that the named Attending Pathologist has evaluated the specimen Technical component performed at [facility], [address] [place], [place] [address] Lab: [phone] Dir: Rush Farmer, MD, MMM  Professional component performed at [facility], [facility], [place], [place], [place] [address] Lab: [phone] Dir: Dellia Nims. Rubinas, MD   SARS CORONAVIRUS 2 (TAT 6-24 HRS) Nasopharyngeal Nasopharyngeal Swab     Status: None   Collection Time: 11/14/20 11:38 AM   Specimen:  Nasopharyngeal Swab  Result Value Ref Range   SARS Coronavirus 2 NEGATIVE NEGATIVE    Comment: (NOTE) SARS-CoV-2 target nucleic acids are NOT DETECTED.  The SARS-CoV-2 RNA is generally detectable in upper and lower respiratory specimens during the acute phase of infection. Negative results do not preclude SARS-CoV-2 infection, do not rule out co-infections with other pathogens, and should not be used as the sole basis for treatment or other patient management  decisions. Negative results must be combined with clinical observations, patient history, and epidemiological information. The expected result is Negative.  Fact Sheet for Patients: SugarRoll.be  Fact Sheet for Healthcare Providers: https://www.woods-mathews.com/  This test is not yet approved or cleared by the [place] FDA and  has been authorized for detection and/or diagnosis of SARS-CoV-2 by FDA under an Emergency Use Authorization (EUA). This EUA will remain  in effect (meaning this test can be used) for the duration of the COVID-19 declaration under Se ction 564(b)(1) of the Act, 21 U.S.C. section 360bbb-3(b)(1), unless the authorization is terminated or revoked sooner.  Performed at [facility], [place] [address], [place], [place] [address]   Comprehensive metabolic panel     Status: Abnormal   Collection Time: 11/18/20  7:56 AM  Result Value Ref Range   Sodium 132 (L) 135 - 145 mmol/L   Potassium 3.8 3.5 - 5.1 mmol/L   Chloride 96 (L) 98 - 111 mmol/L   CO2 27 22 - 32 mmol/L   Glucose, Bld 116 (H) 70 - 99 mg/dL    Comment: Glucose reference range applies only to samples taken after fasting for at least 8 hours.   BUN 11 8 - 23 mg/dL   Creatinine, Ser 0.70 0.44 - 1.00 mg/dL   Calcium 8.5 (L) 8.9 - 10.3 mg/dL   Total Protein 6.9 6.5 - 8.1 g/dL   Albumin 2.6 (L) 3.5 - 5.0 g/dL   AST 23 15 - 41 U/L   ALT 20 0 - 44 U/L   Alkaline Phosphatase 75 38 - 126 U/L   Total Bilirubin 0.5 0.3 - 1.2 mg/dL   GFR, Estimated >60 >60 mL/min    Comment: (NOTE) Calculated using the CKD-EPI Creatinine Equation (2021)    Anion gap 9 5 - 15    Comment: Performed at [facility], [address] [place], [place] [address]  CBC with Differential     Status: Abnormal   Collection Time: 11/18/20  7:56 AM  Result Value Ref Range   WBC 10.8 (H) 4.0 - 10.5 K/uL   RBC 3.94 3.87 - 5.11 MIL/uL   Hemoglobin 10.2 (L) 12.0 - 15.0 g/dL     Comment: Reticulocyte Hemoglobin testing may be clinically indicated, consider ordering this additional test [id number]    HCT 30.9 (L) 36.0 - 46.0 %   MCV 78.4 (L) 80.0 - 100.0 fL   MCH 25.9 (L) 26.0 - 34.0 pg   MCHC 33.0 30.0 - 36.0 g/dL   RDW 13.7 11.5 - 15.5 %   Platelets 348 150 - 400 K/uL   nRBC 0.0 0.0 - 0.2 %   Neutrophils Relative % 82 %   Neutro Abs 8.9 (H) 1.7 - 7.7 K/uL   Lymphocytes Relative 9 %   Lymphs Abs 1.0 0.7 - 4.0 K/uL   Monocytes Relative 8 %   Monocytes Absolute 0.8 0.1 - 1.0 K/uL   Eosinophils Relative 0 %   Eosinophils Absolute 0.0 0.0 - 0.5 K/uL   Basophils Relative 0 %  Basophils Absolute 0.0 0.0 - 0.1 K/uL   Immature Granulocytes 1 %   Abs Immature Granulocytes 0.06 0.00 - 0.07 K/uL    Comment: Performed at [facility], [address] [place], [place] [address]  Protein, urine, random     Status: None   Collection Time: 11/18/20  7:56 AM  Result Value Ref Range   Total Protein, Urine <6 mg/dL    Comment: NO NORMAL RANGE ESTABLISHED FOR THIS TEST Performed at [facility], [address] [place], [place] [address]   Comprehensive metabolic panel     Status: Abnormal   Collection Time: 11/25/20  9:07 AM  Result Value Ref Range   Sodium 133 (L) 135 - 145 mmol/L   Potassium 3.8 3.5 - 5.1 mmol/L   Chloride 94 (L) 98 - 111 mmol/L   CO2 30 22 - 32 mmol/L   Glucose, Bld 111 (H) 70 - 99 mg/dL    Comment: Glucose reference range applies only to samples taken after fasting for at least 8 hours.   BUN 16 8 - 23 mg/dL   Creatinine, Ser 0.70 0.44 - 1.00 mg/dL   Calcium 8.7 (L) 8.9 - 10.3 mg/dL   Total Protein 7.2 6.5 - 8.1 g/dL   Albumin 3.0 (L) 3.5 - 5.0 g/dL   AST 35 15 - 41 U/L   ALT 36 0 - 44 U/L   Alkaline Phosphatase 93 38 - 126 U/L   Total Bilirubin 0.2 (L) 0.3 - 1.2 mg/dL   GFR, Estimated >60 >60 mL/min    Comment: (NOTE) Calculated using the CKD-EPI Creatinine Equation (2021)    Anion gap 9 5 - 15    Comment: Performed  at [facility], [address] [place], [place] [address]  CBC with Differential     Status: Abnormal   Collection Time: 11/25/20  9:07 AM  Result Value Ref Range   WBC 4.7 4.0 - 10.5 K/uL   RBC 4.22 3.87 - 5.11 MIL/uL   Hemoglobin 10.9 (L) 12.0 - 15.0 g/dL    Comment: Reticulocyte Hemoglobin testing may be clinically indicated, consider ordering this additional test [id number]    HCT 33.1 (L) 36.0 - 46.0 %   MCV 78.4 (L) 80.0 - 100.0 fL   MCH 25.8 (L) 26.0 - 34.0 pg   MCHC 32.9 30.0 - 36.0 g/dL   RDW 14.0 11.5 - 15.5 %   Platelets 197 150 - 400 K/uL   nRBC 0.0 0.0 - 0.2 %   Neutrophils Relative % 70 %   Neutro Abs 3.3 1.7 - 7.7 K/uL   Lymphocytes Relative 16 %   Lymphs Abs 0.8 0.7 - 4.0 K/uL   Monocytes Relative 12 %   Monocytes Absolute 0.6 0.1 - 1.0 K/uL   Eosinophils Relative 1 %   Eosinophils Absolute 0.0 0.0 - 0.5 K/uL   Basophils Relative 0 %   Basophils Absolute 0.0 0.0 - 0.1 K/uL   Immature Granulocytes 1 %   Abs Immature Granulocytes 0.03 0.00 - 0.07 K/uL    Comment: Performed at [facility], [address] [place], [place] [address]  Ferritin     Status: Abnormal   Collection Time: 11/25/20  9:07 AM  Result Value Ref Range   Ferritin 1,459 (H) 11 - 307 ng/mL    Comment: Performed at [facility], [address] [place], [place] [address]  Retic Panel     Status: Abnormal   Collection Time: 11/25/20  9:07 AM  Result Value Ref Range   Retic Ct Pct <0.4 (L) 0.4 - 3.1 %    Comment: REPEATED TO VERIFY   RBC. 4.24 3.87 - 5.11 MIL/uL   Retic Count, Absolute 16.5 (L) 19.0 - 186.0 K/uL   Immature Retic Fract 12.1 2.3 - 15.9 %   Reticulocyte Hemoglobin 33.7 >27.9 pg    Comment:        Given the high negative predictive value of a RET-He result > 32 pg iron deficiency is essentially excluded. If this patient is anemic other etiologies should be considered. Performed at [facility], [address] [place], [place] [address]    Iron and TIBC     Status: Abnormal   Collection Time: 11/25/20  9:07 AM  Result Value Ref Range   Iron 45 28 - 170 ug/dL   TIBC 211 (L) 250 - 450 ug/dL   Saturation Ratios 21 10.4 - 31.8 %   UIBC 166 ug/dL    Comment: Performed at [facility], [address] [place], [place] [address]  Comprehensive metabolic panel     Status: Abnormal   Collection Time: 12/01/20 10:15 AM  Result Value Ref Range   Sodium 128 (L) 135 - 145 mmol/L   Potassium 4.1 3.5 - 5.1 mmol/L   Chloride 93 (L) 98 - 111 mmol/L   CO2 26 22 - 32 mmol/L   Glucose, Bld 101 (H) 70 - 99 mg/dL    Comment: Glucose reference range applies only to samples taken after fasting for at least 8 hours.   BUN 10 8 - 23 mg/dL   Creatinine, Ser 0.60 0.44 - 1.00 mg/dL   Calcium 8.3 (L) 8.9 - 10.3 mg/dL   Total Protein 6.7 6.5 - 8.1 g/dL   Albumin 2.7 (L) 3.5 - 5.0 g/dL   AST 20 15 - 41 U/L   ALT 22 0 - 44 U/L   Alkaline Phosphatase 84 38 - 126 U/L   Total Bilirubin 0.6 0.3 - 1.2 mg/dL   GFR, Estimated >60 >60 mL/min    Comment: (NOTE) Calculated using the CKD-EPI Creatinine Equation (2021)    Anion gap 9 5 - 15    Comment: Performed at [facility], [address] [place], [place] [address]  CBC with Differential     Status: Abnormal   Collection Time: 12/01/20 10:15 AM  Result Value Ref Range   WBC 4.9 4.0 - 10.5 K/uL   RBC 4.11 3.87 - 5.11 MIL/uL   Hemoglobin 10.6 (L) 12.0 - 15.0 g/dL    Comment: Reticulocyte Hemoglobin testing may be clinically indicated, consider ordering this additional test [id number]    HCT 32.4 (L) 36.0 - 46.0 %   MCV 78.8 (L) 80.0 - 100.0 fL   MCH 25.8 (L) 26.0 - 34.0 pg   MCHC 32.7 30.0 - 36.0 g/dL   RDW 14.6 11.5 - 15.5 %   Platelets 161 150 - 400 K/uL   nRBC 0.0 0.0 - 0.2 %   Neutrophils Relative % 75 %   Neutro Abs 3.7 1.7 - 7.7 K/uL   Lymphocytes Relative 12 %   Lymphs Abs 0.6 (L) 0.7 - 4.0 K/uL   Monocytes Relative 13 %   Monocytes Absolute 0.6 0.1 - 1.0 K/uL    Eosinophils Relative 0 %   Eosinophils Absolute 0.0 0.0 - 0.5 K/uL   Basophils Relative 0 %   Basophils Absolute 0.0 0.0 - 0.1 K/uL   Immature Granulocytes 0 %   Abs Immature  Granulocytes 0.02 0.00 - 0.07 K/uL    Comment: Performed at [facility] [address] [place], [place] [address]  Basic metabolic panel     Status: Abnormal   Collection Time: 12/05/20  3:07 PM  Result Value Ref Range   Sodium 131 (L) 135 - 145 mmol/L   Potassium 4.1 3.5 - 5.1 mmol/L   Chloride 95 (L) 98 - 111 mmol/L   CO2 28 22 - 32 mmol/L   Glucose, Bld 95 70 - 99 mg/dL    Comment: Glucose reference range applies only to samples taken after fasting for at least 8 hours.   BUN 10 8 - 23 mg/dL   Creatinine, Ser 0.66 0.44 - 1.00 mg/dL   Calcium 8.2 (L) 8.9 - 10.3 mg/dL   GFR, Estimated >60 >60 mL/min    Comment: (NOTE) Calculated using the CKD-EPI Creatinine Equation (2021)    Anion gap 8 5 - 15    Comment: Performed at [facility], [address] [place], [place] [address]  CBC with Differential     Status: Abnormal   Collection Time: 12/12/20  8:06 AM  Result Value Ref Range   WBC 5.9 4.0 - 10.5 K/uL   RBC 4.25 3.87 - 5.11 MIL/uL   Hemoglobin 11.1 (L) 12.0 - 15.0 g/dL   HCT 33.6 (L) 36.0 - 46.0 %   MCV 79.1 (L) 80.0 - 100.0 fL   MCH 26.1 26.0 - 34.0 pg   MCHC 33.0 30.0 - 36.0 g/dL   RDW 15.8 (H) 11.5 - 15.5 %   Platelets 236 150 - 400 K/uL   nRBC 0.0 0.0 - 0.2 %   Neutrophils Relative % 74 %   Neutro Abs 4.4 1.7 - 7.7 K/uL   Lymphocytes Relative 12 %   Lymphs Abs 0.7 0.7 - 4.0 K/uL   Monocytes Relative 13 %   Monocytes Absolute 0.8 0.1 - 1.0 K/uL   Eosinophils Relative 0 %   Eosinophils Absolute 0.0 0.0 - 0.5 K/uL   Basophils Relative 0 %   Basophils Absolute 0.0 0.0 - 0.1 K/uL   Immature Granulocytes 1 %   Abs Immature Granulocytes 0.03 0.00 - 0.07 K/uL    Comment: Performed at [facility], [address] [place], [place] [address]  Comprehensive metabolic panel      Status: Abnormal   Collection Time: 12/12/20  8:06 AM  Result Value Ref Range   Sodium 135 135 - 145 mmol/L   Potassium 3.9 3.5 - 5.1 mmol/L   Chloride 99 98 - 111 mmol/L   CO2 26 22 - 32 mmol/L   Glucose, Bld 105 (H) 70 - 99 mg/dL    Comment: Glucose reference range applies only to samples taken after fasting for at least 8 hours.   BUN 15 8 - 23 mg/dL   Creatinine, Ser 0.56 0.44 - 1.00 mg/dL   Calcium 8.8 (L) 8.9 - 10.3 mg/dL   Total Protein 7.4 6.5 - 8.1 g/dL   Albumin 2.8 (L) 3.5 - 5.0 g/dL   AST 21 15 - 41 U/L   ALT 16 0 - 44 U/L   Alkaline Phosphatase 83 38 - 126 U/L   Total Bilirubin 0.4 0.3 - 1.2 mg/dL   GFR, Estimated >60 >60 mL/min    Comment: (NOTE) Calculated using the CKD-EPI Creatinine Equation (2021)    Anion gap 10 5 - 15    Comment: Performed at [facility], [address], [place], [place] [address]  Protein, urine, random     Status: None   Collection Time: 12/12/20 12:01 PM  Result Value Ref Range   Total Protein, Urine 17 mg/dL    Comment: NO NORMAL RANGE ESTABLISHED FOR THIS TEST Performed at [facility], [address] [place], [place] [address]   Comprehensive metabolic panel     Status: Abnormal   Collection Time: 12/22/20 11:31 AM  Result Value Ref Range   Sodium 132 (L) 135 - 145 mmol/L   Potassium 4.1 3.5 - 5.1 mmol/L   Chloride 94 (L) 98 - 111 mmol/L   CO2 26 22 - 32 mmol/L   Glucose, Bld 113 (H) 70 - 99 mg/dL    Comment: Glucose reference range applies only to samples taken after fasting for at least 8 hours.   BUN 18 8 - 23 mg/dL   Creatinine, Ser 0.76 0.44 - 1.00 mg/dL   Calcium 8.7 (L) 8.9 - 10.3 mg/dL   Total Protein 7.3 6.5 - 8.1 g/dL   Albumin 2.9 (L) 3.5 - 5.0 g/dL   AST 18 15 - 41 U/L   ALT 19 0 - 44 U/L   Alkaline Phosphatase 80 38 - 126 U/L   Total Bilirubin 0.2 (L) 0.3 - 1.2 mg/dL   GFR, Estimated >60 >60 mL/min    Comment: (NOTE) Calculated using the CKD-EPI Creatinine Equation (2021)    Anion gap 12  5 - 15    Comment: Performed at [facility], [address] [place], [place] [address]  CBC with Differential     Status: Abnormal   Collection Time: 12/22/20 11:31 AM  Result Value Ref Range   WBC 3.2 (L) 4.0 - 10.5 K/uL   RBC 4.45 3.87 - 5.11 MIL/uL   Hemoglobin 11.6 (L) 12.0 - 15.0 g/dL   HCT 34.5 (L) 36.0 - 46.0 %   MCV 77.5 (L) 80.0 - 100.0 fL   MCH 26.1 26.0 - 34.0 pg   MCHC 33.6 30.0 - 36.0 g/dL   RDW 16.0 (H) 11.5 - 15.5 %   Platelets 150 150 - 400 K/uL   nRBC 0.0 0.0 - 0.2 %   Neutrophils Relative % 64 %   Neutro Abs 2.1 1.7 - 7.7 K/uL   Lymphocytes Relative 14 %   Lymphs Abs 0.5 (L) 0.7 - 4.0 K/uL   Monocytes Relative 21 %   Monocytes Absolute 0.7 0.1 - 1.0 K/uL   Eosinophils Relative 1 %   Eosinophils Absolute 0.0 0.0 - 0.5 K/uL   Basophils Relative 0 %   Basophils Absolute 0.0 0.0 - 0.1 K/uL   Immature Granulocytes 0 %   Abs Immature Granulocytes 0.00 0.00 - 0.07 K/uL    Comment: Performed at [facility], [address], [place], [place] [address]     Radiology No results found.  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  She does not have any arm, head or neck swelling.  She does have some prominent varicosities across her chest which is not surprising.  She has gotten 2 chemotherapy treatments and is scheduled for another one within a week.  She only has 5 scheduled.  They are doing these peripherally.  I would not plan any intervention for her SVC syndrome at this time as she has no symptoms and she has a very limited course of chemotherapy remaining.  If she were to  run out of peripheral veins, I would consider some sort of PICC line or other access although port could still be considered.  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  Currently undergoing chemotherapy.  Follows with oncology.    Leotis Pain, MD  12/27/2020 11:58 AM    This note was created with Dragon medical transcription system.  Any errors from dictation are purely unintentional

## 2020-12-30 ENCOUNTER — Inpatient Hospital Stay (HOSPITAL_BASED_OUTPATIENT_CLINIC_OR_DEPARTMENT_OTHER): Payer: BC Managed Care – PPO | Admitting: Oncology

## 2020-12-30 ENCOUNTER — Other Ambulatory Visit: Payer: Self-pay | Admitting: Oncology

## 2020-12-30 ENCOUNTER — Ambulatory Visit
Admission: RE | Admit: 2020-12-30 | Discharge: 2020-12-30 | Disposition: A | Discharge status: Home / Self Care | Payer: BC Managed Care – PPO | Source: Ambulatory Visit | Attending: Oncology | Admitting: Oncology

## 2020-12-30 ENCOUNTER — Telehealth: Payer: Self-pay | Admitting: Oncology

## 2020-12-30 ENCOUNTER — Telehealth: Payer: Self-pay | Admitting: *Deleted

## 2020-12-30 ENCOUNTER — Ambulatory Visit
Admission: RE | Admit: 2020-12-30 | Discharge: 2020-12-30 | Disposition: A | Discharge status: Home / Self Care | Payer: BC Managed Care – PPO | Attending: Oncology | Admitting: Oncology

## 2020-12-30 ENCOUNTER — Other Ambulatory Visit: Payer: Self-pay

## 2020-12-30 DIAGNOSIS — R059 Cough, unspecified: Secondary | ICD-10-CM

## 2020-12-30 MED ORDER — AZITHROMYCIN 250 MG PO TABS
ORAL_TABLET | ORAL | 0 refills | Status: DC
Start: 2020-12-30 — End: 2021-02-22

## 2020-12-30 MED ORDER — HYDROCODONE-HOMATROPINE 5-1.5 MG/5ML PO SYRP
5.0000 mL | ORAL_SOLUTION | Freq: Four times a day (QID) | ORAL | 0 refills | Status: DC | PRN
Start: 2020-12-30 — End: 2021-03-22

## 2020-12-30 MED ORDER — CHERATUSSIN AC 100-10 MG/5ML PO SOLN: 5.0000 mL | Freq: Three times a day (TID) | ORAL | 0 refills | Status: DC | PRN

## 2020-12-30 MED ORDER — PREDNISONE 10 MG (21) PO TBPK: ORAL_TABLET | ORAL | 0 refills | Status: DC

## 2020-12-30 NOTE — Telephone Encounter (Signed)
12/30/2020 Left VM to inform pt that appts have been moved later in the AM per Dr. [name] due to inclement weather. SRW

## 2020-12-30 NOTE — Progress Notes (Signed)
Virtual Visit via Telephone Note  I connected with INOLA LISLE on 12/30/20 at  3:00 PM EST by telephone and verified that I am speaking with the correct person using two identifiers.  Location: Patient: Home Provider: Clinic    I discussed the limitations, risks, security and privacy concerns of performing an evaluation and management service by telephone and the availability of in person appointments. I also discussed with the patient that there may be a patient responsible charge related to this service. The patient expressed understanding and agreed to proceed.   History of Present Illness: Mrs. Fuerstenberg is a 60 year old female with past medical history significant for GERD, COPD and tobacco abuse.  She was recently diagnosed with lung cancer after presenting to the emergency room for generalized weakness, dizziness, chest pain and shortness of breath.  She is followed by Dr. [name] and is status post 2 cycles of chemotherapy.  Her last treatment was on 12/12/2020.  She is due for next cycle on Monday.   Observations/Objective: Mrs. Gorelick presents today virtually to [facility] for complaints of a cough.  Patient complains of cough.  Symptoms began 2 weeks ago.  The cough is productive of clear sputum, with shortness of breath and is aggravated by nothing Associated symptoms include:chest pain, shortness of breath, sputum production and back pain.  She has tried OTC cough syrup along with Ladona Ridgel that was prescribed by Dr. [name].  She also has tried Mucinex.  Nothing appears to be helping and she is tired of coughing.  Cough is worse at night.  She denies any fevers.  She has been taking her pain medicine as prescribed with some relief.  Denies any COVID-19 exposures.  She has not been vaccinated.  Assessment and Plan: Patient is able to speak in full complete sentences.  She is in no acute distress.  Reviewed labs from 12/22/2020.  Her white count was 3.2 ANC 2.1.  Hemoglobin was stable.   Sodium was a tad low at 132.  She was seen and evaluated by Dr. 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  She was asymptomatic and appeared to be doing well.  Cough: - Multifactorial; secondary to lung cancer, SVC syndrome but worse x2 weeks. - Recommend stat chest x-ray and COVID testing. - Rx Hycodan cough syrup -Rx prednisone taper -We will call patient with results from chest x-ray. -She is scheduled for cycle 3 on Monday, 01/02/2021. -Recommend she quarantine until COVID test results are back.  Follow Up Instructions: -Return to clinic as scheduled on 01/02/2021 for cycle 3.    I discussed the assessment and treatment plan with the patient. The patient was provided an opportunity to ask questions and all were answered. The patient agreed with the plan and demonstrated an understanding of the instructions.   The patient was advised to call back or seek an in-person evaluation if the symptoms worsen or if the condition fails to improve as anticipated.  I provided 15 minutes of non-face-to-face time during this encounter.   Jacquelin Hawking, NP

## 2020-12-30 NOTE — Telephone Encounter (Signed)
Daughter called reporting that patient has been coughing nonstop since last night and it causing her increased pain and she would like to be seen in [facility] today if possible

## 2020-12-30 NOTE — Progress Notes (Signed)
Re: Chest X-ray   Chest x-ray shows similar appearance from 10/31/20 with residual right pleural effusion/pleural disease, right hilar mass and upper lobe collapse or consolidation. Numerous metastatic pulmonary nodules. Possible increasing airspace disease at the right lower lung.  Consulted Dr. [name].   Steroids and cough syrup as previously written.  RX azithromycin X 5 days.   Covid testing.   Call with any questions or concerns.   Faythe Casa, NP 12/30/2020 5:58 PM

## 2021-01-02 ENCOUNTER — Inpatient Hospital Stay: Payer: BC Managed Care – PPO

## 2021-01-02 ENCOUNTER — Ambulatory Visit: Payer: BC Managed Care – PPO | Admitting: Oncology

## 2021-01-02 ENCOUNTER — Inpatient Hospital Stay (HOSPITAL_BASED_OUTPATIENT_CLINIC_OR_DEPARTMENT_OTHER): Payer: BC Managed Care – PPO | Admitting: Oncology

## 2021-01-02 ENCOUNTER — Inpatient Hospital Stay: Payer: BC Managed Care – PPO | Admitting: Oncology

## 2021-01-02 ENCOUNTER — Encounter: Payer: Self-pay | Admitting: Oncology

## 2021-01-02 ENCOUNTER — Other Ambulatory Visit: Payer: Self-pay

## 2021-01-02 ENCOUNTER — Other Ambulatory Visit: Payer: BC Managed Care – PPO

## 2021-01-02 ENCOUNTER — Ambulatory Visit: Payer: BC Managed Care – PPO

## 2021-01-02 VITALS — BP 118/76 | HR 99 | Temp 98.3°F | Resp 18 | Wt 128.0 lb

## 2021-01-02 DIAGNOSIS — G893 Neoplasm related pain (acute) (chronic): Secondary | ICD-10-CM | POA: Diagnosis not present

## 2021-01-02 DIAGNOSIS — Z5111 Encounter for antineoplastic chemotherapy: Secondary | ICD-10-CM

## 2021-01-02 DIAGNOSIS — R0600 Dyspnea, unspecified: Secondary | ICD-10-CM | POA: Diagnosis not present

## 2021-01-02 DIAGNOSIS — Z5112 Encounter for antineoplastic immunotherapy: Secondary | ICD-10-CM | POA: Diagnosis not present

## 2021-01-02 DIAGNOSIS — C3490 Malignant neoplasm of unspecified part of unspecified bronchus or lung: Secondary | ICD-10-CM

## 2021-01-02 LAB — COMPREHENSIVE METABOLIC PANEL
ALT: 23 U/L (ref 0–44)
AST: 27 U/L (ref 15–41)
Albumin: 2.8 g/dL — ABNORMAL LOW (ref 3.5–5.0)
Alkaline Phosphatase: 89 U/L (ref 38–126)
Anion gap: 7 (ref 5–15)
BUN: 18 mg/dL (ref 8–23)
CO2: 27 mmol/L (ref 22–32)
Calcium: 8.5 mg/dL — ABNORMAL LOW (ref 8.9–10.3)
Chloride: 103 mmol/L (ref 98–111)
Creatinine, Ser: 0.58 mg/dL (ref 0.44–1.00)
GFR, Estimated: >60 mL/min (ref >60)
Glucose, Bld: 108 mg/dL — ABNORMAL HIGH (ref 70–99)
Potassium: 3.7 mmol/L (ref 3.5–5.1)
Sodium: 137 mmol/L (ref 135–145)
Total Bilirubin: <0.1 mg/dL — ABNORMAL LOW (ref 0.3–1.2)
Total Protein: 7 g/dL (ref 6.5–8.1)

## 2021-01-02 LAB — CBC WITH DIFFERENTIAL/PLATELET
Abs Immature Granulocytes: 0.01 10*3/uL (ref 0.00–0.07)
Basophils Absolute: 0 10*3/uL (ref 0.0–0.1)
Basophils Relative: 0 %
Eosinophils Absolute: 0 10*3/uL (ref 0.0–0.5)
Eosinophils Relative: 0 %
HCT: 31.6 % — ABNORMAL LOW (ref 36.0–46.0)
Hemoglobin: 10.4 g/dL — ABNORMAL LOW (ref 12.0–15.0)
Immature Granulocytes: 0 %
Lymphocytes Relative: 12 %
Lymphs Abs: 0.6 10*3/uL — ABNORMAL LOW (ref 0.7–4.0)
MCH: 26.5 pg (ref 26.0–34.0)
MCHC: 32.9 g/dL (ref 30.0–36.0)
MCV: 80.4 fL (ref 80.0–100.0)
Monocytes Absolute: 0.4 10*3/uL (ref 0.1–1.0)
Monocytes Relative: 9 %
Neutro Abs: 3.6 10*3/uL (ref 1.7–7.7)
Neutrophils Relative %: 79 %
Platelets: 214 10*3/uL (ref 150–400)
RBC: 3.93 MIL/uL (ref 3.87–5.11)
RDW: 17.8 % — ABNORMAL HIGH (ref 11.5–15.5)
WBC: 4.6 10*3/uL (ref 4.0–10.5)
nRBC: 0 % (ref 0.0–0.2)

## 2021-01-02 LAB — PROTEIN, URINE, RANDOM: Total Protein, Urine: 30 mg/dL

## 2021-01-02 NOTE — Progress Notes (Signed)
1q    Hematology/Oncology Follow Up Note [facility]  Telephone:([phone] [phone]  Patient Care Team: Ranae Plumber, [place] as PCP - General (Family Medicine) Telford Nab, RN as Oncology Nurse Navigator [name], MD as Medical Oncologist (Oncology)   Name of the patient: Brittney Fisher  [id number]  08-05-1961   REASON FOR VISIT  follow-up for lung cancer  PERTINENT ONCOLOGY HISTORY 60 y.o. female with past medical history including GERD, COPD, current everyday smoker presents for follow-up of lung mass and pleural effusion. 10/20/2020, patient was brought to ED via EMS due to generalized weakness, dizziness, chest pain shortness of breath.  She was recently diagnosed with COPD approximately 1 month ago by primary care provider.  Also unintentional weight loss during the last few months. Image work-up showed right-sided pleural effusion.  She underwent right thoracentesis.  With removal of 2.4 L of fluid. 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  Metastatic disease to low cervical/thoracic nodes, lung, right pleural space and bones.  Moderate to large right pleural effusion.  SVC narrowing. 10/21/2020 MRI brain is negative for metastasis.  Mild chronic microvascular ischemic changes in the white matter.  Negative for acute infarct Regarding to the SVC narrowing, patient was seen by vascular surgeon and was recommended no intervention inpatient.  Patient to follow-up outpatient with vascular surgeon for evaluation. Patient also was treated for COPD exacerbation with hypoxia on exertion. Qualifies for home oxygen and hospitalist arrange home health and a nebulizer.  Patient was given a course of prednisone taper and empiric Levaquin. Patient was discharged and present today to follow-up with cytology results and further management plan  10/27/2020 PET scan showed right  upper lobe primary bronchogenic mass with direct invasion into mediastinum. Metastatic disease to the right pleural space, lung, bone, nodes of the chest and less so lower leg/upper abdomen. Hypermetabolic him along the course of the right axillary vein with concurrent subtle hyper attenuation within the axillary vein and SVC.  This continues to the level of SVC narrowing.  Suspicious for SVC occlusion and developing thrombus. Moderate right pleural effusion and small pericardial effusion.  # # left supraclavicular mass biopsy- pathology is positive for adenocarcinoma. positive for CK7, with patchy, weak CK20. They are  negative for TTF1, NapsinA, GATA3, CDX2, and Pax8. The findings are  nonspecific; but may be compatible with a poorly differentiated  adenocarcinoma of lung origin, especially given the imaging findings  She also had another repeat thoracentesis and fluid cytology showed malignancy.  #NGS showed AXIN 1 [id number], DIS2 M1104f, KRAS G12C, PRDM1 M1 [id number], [id number] MS stable, TPS <1%   tarted palliative RT since 11/14/2020. INTERVAL HISTORY Brittney Fisher a 60y.o. female who has above history reviewed by me today presents for follow up visit for management of metastatic lung cancer, acute visit for swallowing pain.  Problems and complaints are listed below: Patient finishes RT 12/12/2020. Odynophagia has improved gradually. Patient has increased cough last week and was seen by symptom management clinic.  X-ray was obtained which showed similar appearance of the chest comparing to 10/31/2020.  Residual pleural effusion on the right side, pleural disease, right hilar mass and upper lobe collapse.  Numerous metastatic pulmonary nodules.  Possible increased airspace disease at the right lower lung Patient was started on empiric Z-Pak.  Today she reports that coughing has improved.  She coughs out clear phlegm.  No fever or chills She has  experienced more shortness of  breath and may be some chest pain since Saturday.  Chest pain is not associated with deep breath or other activities.  Currently she does not have chest pain. She was accompanied by her daughter.  Review of Systems  Constitutional: Positive for appetite change, fatigue and unexpected weight change. Negative for chills and fever.  HENT:   Negative for hearing loss and voice change.        Odynophagia  Eyes: Negative for eye problems.  Respiratory: Positive for cough and shortness of breath. Negative for chest tightness.   Cardiovascular: Negative for chest pain.  Gastrointestinal: Negative for abdominal distention, abdominal pain and blood in stool.  Endocrine: Negative for hot flashes.  Genitourinary: Negative for difficulty urinating and frequency.   Musculoskeletal: Negative for arthralgias.  Skin: Negative for itching and rash.  Neurological: Negative for extremity weakness.  Hematological: Negative for adenopathy.  Psychiatric/Behavioral: Negative for confusion.      No Known Allergies   Past Medical History:  Diagnosis Date  . COPD (chronic obstructive pulmonary disease) ([place])   . GERD (gastroesophageal reflux disease)   . Metastatic lung cancer (metastasis from lung to other site) [facility]) 11/02/2020     Past Surgical History:  Procedure Laterality Date  . COLONOSCOPY WITH ESOPHAGOGASTRODUODENOSCOPY (EGD)    . COLONOSCOPY WITH PROPOFOL N/A 11/14/2015   Procedure: COLONOSCOPY WITH PROPOFOL;  Surgeon: Hulen Luster, MD;  Location: [facility] ENDOSCOPY;  Service: Gastroenterology;  Laterality: N/A;  . ESOPHAGOGASTRODUODENOSCOPY (EGD) WITH PROPOFOL N/A 11/14/2015   Procedure: ESOPHAGOGASTRODUODENOSCOPY (EGD) WITH PROPOFOL;  Surgeon: Hulen Luster, MD;  Location: [facility] ENDOSCOPY;  Service: Gastroenterology;  Laterality: N/A;    Social History   Socioeconomic History  . Marital status: Married    Spouse name: Not on file  . Number of children: Not on file  . Years of education: Not on  file  . Highest education level: Not on file  Occupational History  . Not on file  Tobacco Use  . Smoking status: Current Every Day Smoker    Packs/day: 0.20    Types: Cigarettes  . Smokeless tobacco: Never Used  Substance and Sexual Activity  . Alcohol use: Never  . Drug use: Never  . Sexual activity: Not Currently  Other Topics Concern  . Not on file  Social History Narrative  . Not on file   Social Determinants of Health   Financial Resource Strain: Not on file  Food Insecurity: Not on file  Transportation Needs: Not on file  Physical Activity: Not on file  Stress: Not on file  Social Connections: Not on file  Intimate Partner Violence: Not on file    Family History  Problem Relation Age of Onset  . Breast cancer Cousin 69  . Diabetes type II Mother   . Hypertension Mother   . Colon cancer Father   . Hypertension Father      Current Outpatient Medications:  .  acetaminophen (TYLENOL) 500 MG tablet, Take 1,000 mg by mouth every 8 (eight) hours as needed for moderate pain. , Disp: , Rfl:  .  azithromycin (ZITHROMAX) 250 MG tablet, 2 tabs on day. 1 tab days 2-5., Disp: 6 each, Rfl: 0 .  benzonatate (TESSALON) 100 MG capsule, Take 1 capsule (100 mg total) by mouth 3 (three) times daily as needed for cough., Disp: 60 capsule, Rfl: 1 .  dexamethasone (DECADRON) 4 MG tablet, Take 1 tab (4 mg) twice a day the day before Alimta chemo. Take 2 tablets (  8 mg) po daily x 3 days starting the day after chemo., Disp: 30 tablet, Rfl: 1 .  feeding supplement (ENSURE ENLIVE / ENSURE PLUS) LIQD, Take 237 mLs by mouth 2 (two) times daily between meals., Disp: 237 mL, Rfl: 12 .  fentaNYL (DURAGESIC) 25 MCG/HR, Place 1 patch onto the skin every 3 (three) days., Disp: 2 patch, Rfl: 0 .  folic acid (FOLVITE) 1 MG tablet, Take 1 tablet (1 mg total) by mouth daily. Start 5-7 days before Alimta chemotherapy. Continue until 21 days after Alimta completed., Disp: 100 tablet, Rfl: 3 .  guaiFENesin  (MUCINEX) 600 MG 12 hr tablet, Take 1 tablet (600 mg total) by mouth 2 (two) times daily., Disp: 14 tablet, Rfl: 0 .  guaiFENesin-codeine (CHERATUSSIN AC) 100-10 MG/5ML syrup, Take 5 mLs by mouth 3 (three) times daily as needed for cough., Disp: 120 mL, Rfl: 0 .  HYDROcodone-homatropine (HYCODAN) 5-1.5 MG/5ML syrup, Take 5 mLs by mouth every 6 (six) hours as needed for cough., Disp: 120 mL, Rfl: 0 .  ipratropium-albuterol (DUONEB) 0.5-2.5 (3) MG/3ML SOLN, Take 3 mLs by nebulization 3 (three) times daily., Disp: 360 mL, Rfl: 1 .  lidocaine-prilocaine (EMLA) cream, Apply to affected area once (Patient not taking: No sig reported), Disp: 30 g, Rfl: 3 .  magic mouthwash w/lidocaine SOLN, Take 5 mLs by mouth 4 (four) times daily as needed for mouth pain. Sig: Swish/Swallow 5-10 ml four times a day as needed. Dispense 480 ml. 1RF, Disp: 480 mL, Rfl: 1 .  omeprazole (PRILOSEC) 20 MG capsule, Take 1 capsule (20 mg total) by mouth 2 (two) times daily before a meal., Disp: 60 capsule, Rfl: 1 .  ondansetron (ZOFRAN) 8 MG tablet, Take 1 tablet (8 mg total) by mouth 2 (two) times daily as needed for refractory nausea / vomiting. Start on day 3 after chemo., Disp: 30 tablet, Rfl: 1 .  oxyCODONE (OXY IR/ROXICODONE) 5 MG immediate release tablet, Take 1 tablet (5 mg total) by mouth every 4 (four) hours as needed for severe pain., Disp: 60 tablet, Rfl: 0 .  Polyethylene Glycol 3350 (MIRALAX PO), Take by mouth daily as needed., Disp: , Rfl:  .  predniSONE (STERAPRED UNI-PAK 21 TAB) 10 MG (21) TBPK tablet, Take as directed., Disp: 21 tablet, Rfl: 0 .  prochlorperazine (COMPAZINE) 10 MG tablet, Take 1 tablet (10 mg total) by mouth every 6 (six) hours as needed (Nausea or vomiting)., Disp: 30 tablet, Rfl: 1 .  senna-docusate (SENOKOT-S) 8.6-50 MG tablet, Take 2 tablets by mouth daily., Disp: 60 tablet, Rfl: 3 .  sucralfate (CARAFATE) 1 GM/10ML suspension, Take 10 mLs (1 g total) by mouth in the morning, at noon, and at  bedtime., Disp: 420 mL, Rfl: 0 .  tiotropium (SPIRIVA HANDIHALER) 18 MCG inhalation capsule, Place 18 mcg into inhaler and inhale daily., Disp: , Rfl:   Physical exam: ECOG 2 Vitals:   01/02/21 1124  BP: 118/76  Pulse: 99  Resp: 18  Temp: 98.3 F (36.8 C)  TempSrc: Tympanic  SpO2: 100%  Weight: 128 lb (58.1 kg)   Physical Exam Constitutional:      General: She is not in acute distress.    Comments: Thin built female walks independently  HENT:     Head: Normocephalic and atraumatic.  Eyes:     General: No scleral icterus. Cardiovascular:     Rate and Rhythm: Regular rhythm. Tachycardia present.     Heart sounds: Normal heart sounds.  Pulmonary:     Effort:  Pulmonary effort is normal. No respiratory distress.     Breath sounds: No wheezing.     Comments: Decreased breath sounds in right lower lung Abdominal:     General: Bowel sounds are normal. There is no distension.     Palpations: Abdomen is soft.  Musculoskeletal:        General: No deformity. Normal range of motion.     Cervical back: Normal range of motion and neck supple.  Skin:    General: Skin is warm and dry.     Findings: No erythema or rash.  Neurological:     Mental Status: She is alert and oriented to person, place, and time. Mental status is at baseline.     Cranial Nerves: No cranial nerve deficit.     Coordination: Coordination normal.  Psychiatric:        Mood and Affect: Mood normal.     CMP Latest Ref Rng & Units 12/22/2020  Glucose 70 - 99 mg/dL 113(H)  BUN 8 - 23 mg/dL 18  Creatinine 0.44 - 1.00 mg/dL 0.76  Sodium 135 - 145 mmol/L 132(L)  Potassium 3.5 - 5.1 mmol/L 4.1  Chloride 98 - 111 mmol/L 94(L)  CO2 22 - 32 mmol/L 26  Calcium 8.9 - 10.3 mg/dL 8.7(L)  Total Protein 6.5 - 8.1 g/dL 7.3  Total Bilirubin 0.3 - 1.2 mg/dL 0.2(L)  Alkaline Phos 38 - 126 U/L 80  AST 15 - 41 U/L 18  ALT 0 - 44 U/L 19   CBC Latest Ref Rng & Units 12/22/2020  WBC 4.0 - 10.5 K/uL 3.2(L)  Hemoglobin 12.0 -  15.0 g/dL 11.6(L)  Hematocrit 36.0 - 46.0 % 34.5(L)  Platelets 150 - 400 K/uL 150    RADIOGRAPHIC STUDIES: I have personally reviewed the radiological images as listed and agreed with the findings in the report. DG Chest 2 View  Result Date: 12/30/2020 CLINICAL DATA:  Cough for 2 weeks history of metastatic lung cancer EXAM: CHEST - 2 VIEW COMPARISON:  10/31/2020, CT 10/18/2020 FINDINGS: Grossly stable appearance of the chest as compared with 10/31/2020. residual right pleural effusion/pleural disease with right hilar mass and upper lobe collapse or consolidation. Numerous metastatic pulmonary nodules as demonstrated on prior CT. Possible increasing airspace disease at the right lower lung. Stable cardiomediastinal silhouette. IMPRESSION: Similar appearance of the chest compared with 10/31/2020 with residual right pleural effusion/pleural disease, right hilar mass and upper lobe collapse or consolidation. Numerous metastatic pulmonary nodules. Possible increasing airspace disease at the right lower lung. Electronically Signed   By: Donavan Foil M.D.   On: 12/30/2020 16:31     Assessment and plan  1. Encounter for antineoplastic chemotherapy   2. Primary malignant neoplasm of lung metastatic to other site, unspecified laterality ([place])   3. 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  cT4 N3 M1-  KRASG12C (Lumakras in subsequent line can be considered) Chemotherapy  carboplatin and Alimta, bevacizumab Hold chemotherapy treatment due to acute bronchitis/pneumonia.  Radiation esophagitis, continue sucralfate and omeprazole.  Symptom has improved  Cough, shortness of breath,  Finished course of Z-Pak.  Cough is better.  However  shortness of breath appears to be worse. Will obtain CT chest angiogram for further evaluation, rule out PE  #  Neoplasm related pain/treatment complication-pain appears to be stable. Continue oxycodone 5 mg every 4 hours as needed.  Discussed about option of liquid morphine and patient declined. Continue fentanyl patch 25 MCG  every 72 hours.   #Bronchial obstruction and SVC occlusion.  s/p palliative radiation #Tachycardic, improved.  Encourage oral hydration. #Anemia, likely secondary to chemotherapy radiation and underlying malignancy.  Hemoglobin stable  #Right pleural effusion, stable. Lab MD Norma Fredrickson, Alimta, bevacizumab, 1 week.  [name], MD, PhD Hematology Oncology [facility] at [facility] Pager- [phone] 01/02/2021

## 2021-01-02 NOTE — Progress Notes (Signed)
Pt in for follow up, reports took last z pak dose today and still taking prednisone. Reports improved appetite, weight gain of 3 lbs.

## 2021-01-03 ENCOUNTER — Other Ambulatory Visit: Payer: Self-pay | Admitting: Oncology

## 2021-01-03 ENCOUNTER — Ambulatory Visit: Payer: BC Managed Care – PPO

## 2021-01-03 DIAGNOSIS — R0600 Dyspnea, unspecified: Secondary | ICD-10-CM

## 2021-01-04 ENCOUNTER — Telehealth: Payer: Self-pay | Admitting: *Deleted

## 2021-01-04 ENCOUNTER — Telehealth: Payer: Self-pay

## 2021-01-04 NOTE — Telephone Encounter (Signed)
I was able to contact [facility] to get pt CT per 01/02/21 los. sched, per Chrystal from [place] stated pt  could come tomorrow morning 01/05/21 as a walk in due to there appts being pushed out until 01/17/21. Pts niece [name] was made aware.

## 2021-01-04 NOTE — Telephone Encounter (Signed)
Nutrition  Niece, [name] called RD and rescheduled phone visit for 2/3 due to CT scan.   Teairra Millar B. [name], [place], [place] Registered Dietitian [phone] (mobile)

## 2021-01-04 NOTE — Telephone Encounter (Signed)
FYI.Marland KitchenMarland KitchenMarland KitchenMarland KitchenMarland KitchenPer MD 01/02/21 los. Pt needs a CT chest - ASAP (within the next 3 days). CT was sched @ [place] on 01/02/21 . However, it had to be cx per Margreta Journey due to pts Ins [facility] is a preferred provider for her ins and [facility] is not.  I tried to get pt CT scan sched in [place] on 01/02/21 I was unable to do so due to the office being closed due to the weather. I also tried on 01/03/21 and office VM stated that the office was currently close. I'm currently working on trying to get pts sched scan today and I will contact pts niece Reita Chard to make her aware of the sched appt.

## 2021-01-05 ENCOUNTER — Ambulatory Visit
Admission: RE | Admit: 2021-01-05 | Discharge: 2021-01-05 | Disposition: A | Discharge status: Home / Self Care | Payer: BC Managed Care – PPO | Source: Ambulatory Visit | Attending: Oncology | Admitting: Oncology

## 2021-01-05 ENCOUNTER — Other Ambulatory Visit: Payer: Self-pay

## 2021-01-05 ENCOUNTER — Inpatient Hospital Stay: Payer: BC Managed Care – PPO

## 2021-01-05 DIAGNOSIS — R0600 Dyspnea, unspecified: Secondary | ICD-10-CM

## 2021-01-05 MED ORDER — IOPAMIDOL (ISOVUE-370) INJECTION 76%
75.0000 mL | Freq: Once | INTRAVENOUS | Status: AC | PRN
  Administered 2021-01-05: 75 mL via INTRAVENOUS

## 2021-01-09 ENCOUNTER — Inpatient Hospital Stay: Payer: BC Managed Care – PPO

## 2021-01-09 ENCOUNTER — Encounter: Payer: Self-pay | Admitting: Oncology

## 2021-01-09 ENCOUNTER — Inpatient Hospital Stay (HOSPITAL_BASED_OUTPATIENT_CLINIC_OR_DEPARTMENT_OTHER): Payer: BC Managed Care – PPO | Admitting: Oncology

## 2021-01-09 VITALS — BP 101/67 | HR 103 | Temp 98.3°F | Resp 18 | Wt 129.0 lb

## 2021-01-09 VITALS — BP 124/71 | HR 92

## 2021-01-09 DIAGNOSIS — J4489 Other specified chronic obstructive pulmonary disease: Secondary | ICD-10-CM

## 2021-01-09 DIAGNOSIS — N1330 Unspecified hydronephrosis: Secondary | ICD-10-CM

## 2021-01-09 DIAGNOSIS — C3490 Malignant neoplasm of unspecified part of unspecified bronchus or lung: Secondary | ICD-10-CM | POA: Diagnosis not present

## 2021-01-09 DIAGNOSIS — J449 Chronic obstructive pulmonary disease, unspecified: Secondary | ICD-10-CM

## 2021-01-09 DIAGNOSIS — G893 Neoplasm related pain (acute) (chronic): Secondary | ICD-10-CM | POA: Diagnosis not present

## 2021-01-09 DIAGNOSIS — Z5112 Encounter for antineoplastic immunotherapy: Secondary | ICD-10-CM | POA: Diagnosis not present

## 2021-01-09 DIAGNOSIS — Z5111 Encounter for antineoplastic chemotherapy: Secondary | ICD-10-CM | POA: Diagnosis not present

## 2021-01-09 DIAGNOSIS — E860 Dehydration: Secondary | ICD-10-CM

## 2021-01-09 DIAGNOSIS — D649 Anemia, unspecified: Secondary | ICD-10-CM

## 2021-01-09 DIAGNOSIS — R1310 Dysphagia, unspecified: Secondary | ICD-10-CM

## 2021-01-09 DIAGNOSIS — N1339 Other hydronephrosis: Secondary | ICD-10-CM

## 2021-01-09 LAB — COMPREHENSIVE METABOLIC PANEL
ALT: 18 U/L (ref 0–44)
AST: 21 U/L (ref 15–41)
Albumin: 2.7 g/dL — ABNORMAL LOW (ref 3.5–5.0)
Alkaline Phosphatase: 84 U/L (ref 38–126)
Anion gap: 7 (ref 5–15)
BUN: 16 mg/dL (ref 8–23)
CO2: 26 mmol/L (ref 22–32)
Calcium: 8.4 mg/dL — ABNORMAL LOW (ref 8.9–10.3)
Chloride: 100 mmol/L (ref 98–111)
Creatinine, Ser: 0.53 mg/dL (ref 0.44–1.00)
GFR, Estimated: >60 mL/min (ref >60)
Glucose, Bld: 120 mg/dL — ABNORMAL HIGH (ref 70–99)
Potassium: 4.2 mmol/L (ref 3.5–5.1)
Sodium: 133 mmol/L — ABNORMAL LOW (ref 135–145)
Total Bilirubin: 0.4 mg/dL (ref 0.3–1.2)
Total Protein: 6.8 g/dL (ref 6.5–8.1)

## 2021-01-09 LAB — CBC WITH DIFFERENTIAL/PLATELET
Abs Immature Granulocytes: 0.04 10*3/uL (ref 0.00–0.07)
Basophils Absolute: 0 10*3/uL (ref 0.0–0.1)
Basophils Relative: 0 %
Eosinophils Absolute: 0 10*3/uL (ref 0.0–0.5)
Eosinophils Relative: 0 %
HCT: 32.6 % — ABNORMAL LOW (ref 36.0–46.0)
Hemoglobin: 10.7 g/dL — ABNORMAL LOW (ref 12.0–15.0)
Immature Granulocytes: 1 %
Lymphocytes Relative: 9 %
Lymphs Abs: 0.7 10*3/uL (ref 0.7–4.0)
MCH: 26.4 pg (ref 26.0–34.0)
MCHC: 32.8 g/dL (ref 30.0–36.0)
MCV: 80.3 fL (ref 80.0–100.0)
Monocytes Absolute: 0.5 10*3/uL (ref 0.1–1.0)
Monocytes Relative: 7 %
Neutro Abs: 6.3 10*3/uL (ref 1.7–7.7)
Neutrophils Relative %: 83 %
Platelets: 189 10*3/uL (ref 150–400)
RBC: 4.06 MIL/uL (ref 3.87–5.11)
RDW: 18.9 % — ABNORMAL HIGH (ref 11.5–15.5)
WBC: 7.5 10*3/uL (ref 4.0–10.5)
nRBC: 0 % (ref 0.0–0.2)

## 2021-01-09 MED ORDER — AMOXICILLIN-POT CLAVULANATE 875-125 MG PO TABS: 1.0000 | ORAL_TABLET | Freq: Two times a day (BID) | ORAL | 0 refills | Status: DC

## 2021-01-09 MED ORDER — PALONOSETRON HCL INJECTION 0.25 MG/5ML
0.2500 mg | Freq: Once | INTRAVENOUS | Status: AC
  Administered 2021-01-09: 0.25 mg via INTRAVENOUS
  Filled 2021-01-09: qty 5

## 2021-01-09 MED ORDER — SODIUM CHLORIDE 0.9 % IV SOLN
150.0000 mg | Freq: Once | INTRAVENOUS | Status: AC
  Administered 2021-01-09: 150 mg via INTRAVENOUS
  Filled 2021-01-09: qty 150

## 2021-01-09 MED ORDER — SODIUM CHLORIDE 0.9 % IV SOLN
10.0000 mg | Freq: Once | INTRAVENOUS | Status: AC
  Administered 2021-01-09: 10 mg via INTRAVENOUS
  Filled 2021-01-09: qty 10

## 2021-01-09 MED ORDER — SODIUM CHLORIDE 0.9 % IV SOLN
503.5000 mg | Freq: Once | INTRAVENOUS | Status: AC
Start: 2021-01-09 — End: 2021-01-09
  Administered 2021-01-09: 500 mg via INTRAVENOUS
  Filled 2021-01-09: qty 50

## 2021-01-09 MED ORDER — SODIUM CHLORIDE 0.9 % IV SOLN
Freq: Once | INTRAVENOUS | Status: AC
  Filled 2021-01-09: qty 250

## 2021-01-09 MED ORDER — SODIUM CHLORIDE 0.9 % IV SOLN
15.0000 mg/kg | Freq: Once | INTRAVENOUS | Status: AC
  Administered 2021-01-09: 900 mg via INTRAVENOUS
  Filled 2021-01-09: qty 32

## 2021-01-09 MED ORDER — SODIUM CHLORIDE 0.9 % IV SOLN
500.0000 mg/m2 | Freq: Once | INTRAVENOUS | Status: AC
  Administered 2021-01-09: 900 mg via INTRAVENOUS
  Filled 2021-01-09: qty 20

## 2021-01-09 NOTE — Progress Notes (Signed)
Patient received prescribed treatment in clinic. Tolerated well. Patient stable at discharge. 

## 2021-01-09 NOTE — Progress Notes (Signed)
Patient still has episodes of cough that is accompanying with right side rib pain.  Would like to discuss the possibility of taking steroid to improve appetite because when she was on the Prednisone taper pak her appetite did improve.

## 2021-01-09 NOTE — Progress Notes (Signed)
Hematology/Oncology Follow Up Note [facility]  [phone] [phone]  Patient Care Team: Ranae Plumber, [place] as PCP - General (Family Medicine) Telford Nab, RN as Oncology Nurse Navigator [name], MD as Medical Oncologist (Oncology)   Name of the patient: Brittney Fisher  [id number]  March 17, 1961   REASON FOR VISIT  follow-up for lung cancer  PERTINENT ONCOLOGY HISTORY 60 y.o. female with past medical history including GERD, COPD, current everyday smoker presents for follow-up of lung mass and pleural effusion. 10/20/2020, patient was brought to ED via EMS due to generalized weakness, dizziness, chest pain shortness of breath.  She was recently diagnosed with COPD approximately 1 month ago by primary care provider.  Also unintentional weight loss during the last few months. Image work-up showed right-sided pleural effusion.  She underwent right thoracentesis.  With removal of 2.4 L of fluid. 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  Metastatic disease to low cervical/thoracic nodes, lung, right pleural space and bones.  Moderate to large right pleural effusion.  SVC narrowing. 10/21/2020 MRI brain is negative for metastasis.  Mild chronic microvascular ischemic changes in the white matter.  Negative for acute infarct Regarding to the SVC narrowing, patient was seen by vascular surgeon and was recommended no intervention inpatient.  Patient to follow-up outpatient with vascular surgeon for evaluation. Patient also was treated for COPD exacerbation with hypoxia on exertion. Qualifies for home oxygen and hospitalist arrange home health and a nebulizer.  Patient was given a course of prednisone taper and empiric Levaquin. Patient was discharged and present today to follow-up with cytology results and further management plan  10/27/2020 PET scan showed right  upper lobe primary bronchogenic mass with direct invasion into mediastinum. Metastatic disease to the right pleural space, lung, bone, nodes of the chest and less so lower leg/upper abdomen. Hypermetabolic him along the course of the right axillary vein with concurrent subtle hyper attenuation within the axillary vein and SVC.  This continues to the level of SVC narrowing.  Suspicious for SVC occlusion and developing thrombus. Moderate right pleural effusion and small pericardial effusion.  # # left supraclavicular mass biopsy- pathology is positive for adenocarcinoma. positive for CK7, with patchy, weak CK20. They are  negative for TTF1, NapsinA, GATA3, CDX2, and Pax8. The findings are  nonspecific; but may be compatible with a poorly differentiated  adenocarcinoma of lung origin, especially given the imaging findings  She also had another repeat thoracentesis and fluid cytology showed malignancy.  #NGS showed AXIN 1 [id number], DIS2 M160f, KRAS G12C, PRDM1 M1 [id number], [id number] MS stable, TPS <1%    11/14/2020- 12/12/2020. palliative chest RT     INTERVAL HISTORY Brittney PAGLIARULOis a 60y.o. female who has above history reviewed by me today presents for follow up visit for management of metastatic lung cancer, acute visit for swallowing pain.  Problems and complaints are listed below:  Odynophagia, patient takes antiacid.  Not taking Carafate Magic mouthwash.  Continues to have symptoms.  Cough is better with intermittent shortness of breath, patient reports right rib cage pain with cough.  She takes oxycodone 5 mg every 4 hours as needed for pain.  She was recommended to try fentanyl patch and she declined.  01/05/2021, CT chest angiogram PE protocol showed no evidence of pulmonary embolism.  Stable disease.  Right upper lobe mass about 6.7 x 5.3 cm.  Widespread metastatic disease, lung and bone involvement.  Interval development of right-sided hydronephrosis.   Review of Systems   Constitutional: Positive for appetite change and fatigue. Negative for chills, fever and unexpected weight change.  HENT:   Negative for hearing loss and voice change.        Odynophagia  Eyes: Negative for eye problems.  Respiratory: Positive for cough and shortness of breath. Negative for chest tightness.   Cardiovascular: Negative for chest pain.  Gastrointestinal: Negative for abdominal distention, abdominal pain and blood in stool.  Endocrine: Negative for hot flashes.  Genitourinary: Negative for difficulty urinating and frequency.   Musculoskeletal: Negative for arthralgias.  Skin: Negative for itching and rash.  Neurological: Negative for extremity weakness.  Hematological: Negative for adenopathy.  Psychiatric/Behavioral: Negative for confusion.      No Known Allergies   Past Medical History:  Diagnosis Date  . COPD (chronic obstructive pulmonary disease) ([place])   . GERD (gastroesophageal reflux disease)   . Metastatic lung cancer (metastasis from lung to other site) [facility]) 11/02/2020     Past Surgical History:  Procedure Laterality Date  . COLONOSCOPY WITH ESOPHAGOGASTRODUODENOSCOPY (EGD)    . COLONOSCOPY WITH PROPOFOL N/A 11/14/2015   Procedure: COLONOSCOPY WITH PROPOFOL;  Surgeon: Hulen Luster, MD;  Location: [facility] ENDOSCOPY;  Service: Gastroenterology;  Laterality: N/A;  . ESOPHAGOGASTRODUODENOSCOPY (EGD) WITH PROPOFOL N/A 11/14/2015   Procedure: ESOPHAGOGASTRODUODENOSCOPY (EGD) WITH PROPOFOL;  Surgeon: Hulen Luster, MD;  Location: [facility] ENDOSCOPY;  Service: Gastroenterology;  Laterality: N/A;    Social History   Socioeconomic History  . Marital status: Married    Spouse name: Not on file  . Number of children: Not on file  . Years of education: Not on file  . Highest education level: Not on file  Occupational History  . Not on file  Tobacco Use  . Smoking status: Current Every Day Smoker    Packs/day: 0.20    Types: Cigarettes  . Smokeless tobacco: Never Used   Substance and Sexual Activity  . Alcohol use: Never  . Drug use: Never  . Sexual activity: Not Currently  Other Topics Concern  . Not on file  Social History Narrative  . Not on file   Social Determinants of Health   Financial Resource Strain: Not on file  Food Insecurity: Not on file  Transportation Needs: Not on file  Physical Activity: Not on file  Stress: Not on file  Social Connections: Not on file  Intimate Partner Violence: Not on file    Family History  Problem Relation Age of Onset  . Breast cancer Cousin 46  . Diabetes type II Mother   . Hypertension Mother   . Colon cancer Father   . Hypertension Father      Current Outpatient Medications:  .  acetaminophen (TYLENOL) 500 MG tablet, Take 1,000 mg by mouth every 8 (eight) hours as needed for moderate pain. , Disp: , Rfl:  .  azithromycin (ZITHROMAX) 250 MG tablet, 2 tabs on day. 1 tab days 2-5., Disp: 6 each, Rfl: 0 .  benzonatate (TESSALON) 100 MG capsule, Take 1 capsule (100 mg total) by mouth 3 (three) times daily as needed for cough., Disp: 60 capsule, Rfl: 1 .  dexamethasone (DECADRON) 4 MG tablet, Take 1 tab (4 mg) twice a day the day before Alimta chemo. Take 2 tablets (8 mg) po daily x 3 days starting the day after chemo., Disp: 30 tablet, Rfl: 1 .  feeding supplement (ENSURE ENLIVE / ENSURE PLUS) LIQD, Take 237 mLs by mouth 2 (  two) times daily between meals., Disp: 237 mL, Rfl: 12 .  fentaNYL (DURAGESIC) 25 MCG/HR, Place 1 patch onto the skin every 3 (three) days. (Patient not taking: Reported on 01/02/2021), Disp: 2 patch, Rfl: 0 .  folic acid (FOLVITE) 1 MG tablet, Take 1 tablet (1 mg total) by mouth daily. Start 5-7 days before Alimta chemotherapy. Continue until 21 days after Alimta completed., Disp: 100 tablet, Rfl: 3 .  guaiFENesin (MUCINEX) 600 MG 12 hr tablet, Take 1 tablet (600 mg total) by mouth 2 (two) times daily., Disp: 14 tablet, Rfl: 0 .  guaiFENesin-codeine (CHERATUSSIN AC) 100-10 MG/5ML  syrup, Take 5 mLs by mouth 3 (three) times daily as needed for cough., Disp: 120 mL, Rfl: 0 .  HYDROcodone-homatropine (HYCODAN) 5-1.5 MG/5ML syrup, Take 5 mLs by mouth every 6 (six) hours as needed for cough., Disp: 120 mL, Rfl: 0 .  ipratropium-albuterol (DUONEB) 0.5-2.5 (3) MG/3ML SOLN, Take 3 mLs by nebulization 3 (three) times daily., Disp: 360 mL, Rfl: 1 .  magic mouthwash w/lidocaine SOLN, Take 5 mLs by mouth 4 (four) times daily as needed for mouth pain. Sig: Swish/Swallow 5-10 ml four times a day as needed. Dispense 480 ml. 1RF, Disp: 480 mL, Rfl: 1 .  omeprazole (PRILOSEC) 20 MG capsule, Take 1 capsule (20 mg total) by mouth 2 (two) times daily before a meal., Disp: 60 capsule, Rfl: 1 .  ondansetron (ZOFRAN) 8 MG tablet, Take 1 tablet (8 mg total) by mouth 2 (two) times daily as needed for refractory nausea / vomiting. Start on day 3 after chemo. (Patient not taking: Reported on 01/02/2021), Disp: 30 tablet, Rfl: 1 .  oxyCODONE (OXY IR/ROXICODONE) 5 MG immediate release tablet, Take 1 tablet (5 mg total) by mouth every 4 (four) hours as needed for severe pain., Disp: 60 tablet, Rfl: 0 .  Polyethylene Glycol 3350 (MIRALAX PO), Take by mouth daily as needed., Disp: , Rfl:  .  predniSONE (STERAPRED UNI-PAK 21 TAB) 10 MG (21) TBPK tablet, Take as directed., Disp: 21 tablet, Rfl: 0 .  prochlorperazine (COMPAZINE) 10 MG tablet, Take 1 tablet (10 mg total) by mouth every 6 (six) hours as needed (Nausea or vomiting). (Patient not taking: Reported on 01/02/2021), Disp: 30 tablet, Rfl: 1 .  senna-docusate (SENOKOT-S) 8.6-50 MG tablet, Take 2 tablets by mouth daily., Disp: 60 tablet, Rfl: 3 .  sucralfate (CARAFATE) 1 GM/10ML suspension, Take 10 mLs (1 g total) by mouth in the morning, at noon, and at bedtime. (Patient not taking: Reported on 01/02/2021), Disp: 420 mL, Rfl: 0 .  tiotropium (SPIRIVA HANDIHALER) 18 MCG inhalation capsule, Place 18 mcg into inhaler and inhale daily., Disp: , Rfl:   Physical  exam: ECOG 2 Vitals:   01/09/21 0858  BP: 101/67  Pulse: (!) 103  Resp: 18  Temp: 98.3 F (36.8 C)  Weight: 129 lb (58.5 kg)   Physical Exam Constitutional:      General: She is not in acute distress.    Comments: Thin built female walks independently  HENT:     Head: Normocephalic and atraumatic.  Eyes:     General: No scleral icterus. Cardiovascular:     Rate and Rhythm: Regular rhythm. Tachycardia present.     Heart sounds: Normal heart sounds.  Pulmonary:     Effort: Pulmonary effort is normal. No respiratory distress.     Breath sounds: No wheezing.     Comments: Decreased breath sounds in right lower lung Abdominal:     General: Bowel sounds  are normal. There is no distension.     Palpations: Abdomen is soft.  Musculoskeletal:        General: No deformity. Normal range of motion.     Cervical back: Normal range of motion and neck supple.  Skin:    General: Skin is warm and dry.     Findings: No erythema or rash.  Neurological:     Mental Status: She is alert and oriented to person, place, and time. Mental status is at baseline.     Cranial Nerves: No cranial nerve deficit.     Coordination: Coordination normal.  Psychiatric:        Mood and Affect: Mood normal.     CMP Latest Ref Rng & Units 01/02/2021  Glucose 70 - 99 mg/dL 108(H)  BUN 8 - 23 mg/dL 18  Creatinine 0.44 - 1.00 mg/dL 0.58  Sodium 135 - 145 mmol/L 137  Potassium 3.5 - 5.1 mmol/L 3.7  Chloride 98 - 111 mmol/L 103  CO2 22 - 32 mmol/L 27  Calcium 8.9 - 10.3 mg/dL 8.5(L)  Total Protein 6.5 - 8.1 g/dL 7.0  Total Bilirubin 0.3 - 1.2 mg/dL <0.1(L)  Alkaline Phos 38 - 126 U/L 89  AST 15 - 41 U/L 27  ALT 0 - 44 U/L 23   CBC Latest Ref Rng & Units 01/02/2021  WBC 4.0 - 10.5 K/uL 4.6  Hemoglobin 12.0 - 15.0 g/dL 10.4(L)  Hematocrit 36.0 - 46.0 % 31.6(L)  Platelets 150 - 400 K/uL 214    RADIOGRAPHIC STUDIES: I have personally reviewed the radiological images as listed and agreed with the  findings in the report. DG Chest 2 View  Result Date: 12/30/2020 CLINICAL DATA:  Cough for 2 weeks history of metastatic lung cancer EXAM: CHEST - 2 VIEW COMPARISON:  10/31/2020, CT 10/18/2020 FINDINGS: Grossly stable appearance of the chest as compared with 10/31/2020. residual right pleural effusion/pleural disease with right hilar mass and upper lobe collapse or consolidation. Numerous metastatic pulmonary nodules as demonstrated on prior CT. Possible increasing airspace disease at the right lower lung. Stable cardiomediastinal silhouette. IMPRESSION: Similar appearance of the chest compared with 10/31/2020 with residual right pleural effusion/pleural disease, right hilar mass and upper lobe collapse or consolidation. Numerous metastatic pulmonary nodules. Possible increasing airspace disease at the right lower lung. Electronically Signed   By: Donavan Foil M.D.   On: 12/30/2020 16:31   CT ANGIO CHEST PE W OR WO CONTRAST  Result Date: 01/05/2021 CLINICAL DATA:  60 year old female with history of lung cancer presenting with shortness of breath and cough for several weeks. Evaluate for pulmonary embolism. EXAM: CT ANGIOGRAPHY CHEST WITH CONTRAST TECHNIQUE: Multidetector CT imaging of the chest was performed using the standard protocol during bolus administration of intravenous contrast. Multiplanar CT image reconstructions and MIPs were obtained to evaluate the vascular anatomy. CONTRAST:  26m ISOVUE-370 IOPAMIDOL (ISOVUE-370) INJECTION 76% COMPARISON:  Chest CT 10/18/2020.  PET-CT 10/27/2020. FINDINGS: Cardiovascular: No filling defects within the pulmonary arterial tree to suggest underlying pulmonary embolism. Heart size is normal. There is no significant pericardial fluid, thickening or pericardial calcification. Aortic atherosclerosis. No definite coronary artery calcifications. Mediastinum/Nodes: Extensive mediastinal and bilateral hilar lymphadenopathy again noted. Bulkiest lymphadenopathy is in the  low right paratracheal nodal station measuring up to 2.6 cm in short axis. Left hilar lymphadenopathy up to 1.3 cm in short axis. Soft tissue throughout the right hilar region indistinguishable from adjacent pulmonary masses, presumably reflective of hilar lymphadenopathy. Esophagus is unremarkable in appearance. No axillary  lymphadenopathy. Lungs/Pleura: Widespread pleural nodularity and enhancement in the right hemithorax, similar to the prior study, indicative of metastatic disease to the pleura. Previously noted right malignant pleural effusion has decreased substantially in size, currently small and partially loculated. There has been re-expansion of the right lower lobe. There continues to be considerable atelectasis in portions of the right middle lobe and complete opacification of the right upper lobe which likely reflects a combination of an underlying mass as well as postobstructive changes. The mass is difficult to delineate on today's examination, but estimated to measure approximately 6.7 x 5.3 cm. Widespread metastatic nodularity scattered throughout the lungs bilaterally, similar in number to the prior examination some of these lesions appears slightly smaller than the prior study, while other lesions appears slightly larger than the prior examination, overall essentially stable. Upper Abdomen: Probable right-sided hydronephrosis incompletely imaged. Aortic atherosclerosis. Musculoskeletal: Numerous predominantly mixed lytic and sclerotic lesions are again noted throughout the visualized axial and appendicular skeleton, largest of which is in the manubrium, similar to the prior study. Some lesions including a lytic expansile lesion in the lateral aspect of the right fifth rib (axial image 129 of series 13) appear increased in size compared to the prior study. Review of the MIP images confirms the above findings. IMPRESSION: 1. No evidence of pulmonary embolism. 2. Right upper lobe lung cancer with  widespread metastatic disease in the thorax including numerous bilateral pulmonary nodules, malignant right pleural disease, and bilateral hilar and mediastinal lymphadenopathy, as detailed above. Metastatic disease to the bones appears slightly progressive compared to prior studies, as above. 3. Interval development of what appears to be right-sided hydronephrosis. Further evaluation with CT the abdomen and pelvis with IV contrast is suggested to better evaluate this finding. 4. Aortic atherosclerosis. Aortic Atherosclerosis (ICD10-I70.0). Electronically Signed   By: Vinnie Langton M.D.   On: 01/05/2021 09:40     Assessment and plan  1. Encounter for antineoplastic chemotherapy   2. Primary malignant neoplasm of lung metastatic to other site, unspecified laterality ([place])   3. Neoplasm related pain   4. Odynophagia   5. Anemia, unspecified type   6. Hydronephrosis, unspecified hydronephrosis type   7. Bronchitis with airway obstruction ([place])   8. Dehydration   9. 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  cT4 N3 M1-  KRASG12C (Lumakras in subsequent line can be considered) Chemotherapy  carboplatin and Alimta, bevacizumab Labs are reviewed and discussed with patient. Proceed with cycle 3 carboplatin, Alimta, bevacizumab.  Radiation esophagitis, persistent symptoms. Recommend patient to continue omeprazole, take Carafate and also Magic mouthwash swish and swallow prior to eating Decreased oral intake, patient will get IV normal saline 1 L x 1 today along with her chemotherapy. Repeat B12 injection at next visit.   Cough, intermittent shortness of breath, no PE on CT angiogram. CT scan images were independently reviewed by  me and discussed with patient Stable disease.  No pulmonary embolism.  Persistent right upper lobe mass/obstruction. I recommend patient to take Augmentin twice daily for 2 weeks for treatment of possible obstructive pneumonia.   #Neoplasm related pain/treatment complication-pain appears to be stable. Continue oxycodone 5 mg every 4 hours as needed.  Discussed about option of liquid morphine and patient declined.  Patient  does not want to use fentanyl patches.   #Bronchial obstruction and SVC occlusion.  s/p palliative radiation #Tachycardic, likely due to decreased oral intake, lung cancer.  IV normal saline 1 L x 1. #Anemia, likely secondary to chemotherapy radiation and underlying malignancy.  Hemoglobin stable  #Right pleural effusion, stable. #Decreased appetite, recommend patient to use dexamethasone 4 mg twice daily for 4 days after this round of chemo. #Hydronephrosis, will obtain CT abdomen pelvis with contrast for further evaluation. Lab MD IV fluid in 1 week.  Lab MD Hollie Salk, bevacizumab, 3 weeks  [name], MD, PhD Hematology Oncology [facility] Pager- [phone] 01/09/2021

## 2021-01-11 ENCOUNTER — Other Ambulatory Visit: Payer: Self-pay | Admitting: *Deleted

## 2021-01-11 MED ORDER — OXYCODONE HCL 5 MG PO TABS: 5.0000 mg | ORAL_TABLET | ORAL | 0 refills | Status: DC | PRN

## 2021-01-16 ENCOUNTER — Ambulatory Visit: Payer: BC Managed Care – PPO | Admitting: Radiation Oncology

## 2021-01-17 ENCOUNTER — Ambulatory Visit
Admission: RE | Admit: 2021-01-17 | Discharge: 2021-01-17 | Disposition: A | Discharge status: Home / Self Care | Payer: BC Managed Care – PPO | Source: Ambulatory Visit | Attending: Radiation Oncology | Admitting: Radiation Oncology

## 2021-01-17 ENCOUNTER — Encounter: Payer: Self-pay | Admitting: Oncology

## 2021-01-17 ENCOUNTER — Inpatient Hospital Stay: Payer: BC Managed Care – PPO

## 2021-01-17 ENCOUNTER — Inpatient Hospital Stay: Payer: BC Managed Care – PPO | Attending: Oncology

## 2021-01-17 ENCOUNTER — Other Ambulatory Visit: Payer: Self-pay

## 2021-01-17 ENCOUNTER — Inpatient Hospital Stay (HOSPITAL_BASED_OUTPATIENT_CLINIC_OR_DEPARTMENT_OTHER): Payer: BC Managed Care – PPO | Admitting: Oncology

## 2021-01-17 VITALS — BP 100/71 | HR 116 | Temp 98.6°F | Resp 18 | Wt 128.7 lb

## 2021-01-17 DIAGNOSIS — K449 Diaphragmatic hernia without obstruction or gangrene: Secondary | ICD-10-CM | POA: Insufficient documentation

## 2021-01-17 DIAGNOSIS — E538 Deficiency of other specified B group vitamins: Secondary | ICD-10-CM | POA: Insufficient documentation

## 2021-01-17 DIAGNOSIS — J449 Chronic obstructive pulmonary disease, unspecified: Secondary | ICD-10-CM

## 2021-01-17 DIAGNOSIS — N1330 Unspecified hydronephrosis: Secondary | ICD-10-CM

## 2021-01-17 DIAGNOSIS — Z5111 Encounter for antineoplastic chemotherapy: Secondary | ICD-10-CM | POA: Diagnosis present

## 2021-01-17 DIAGNOSIS — J910 Malignant pleural effusion: Secondary | ICD-10-CM | POA: Insufficient documentation

## 2021-01-17 DIAGNOSIS — Z803 Family history of malignant neoplasm of breast: Secondary | ICD-10-CM | POA: Diagnosis not present

## 2021-01-17 DIAGNOSIS — R000 Tachycardia, unspecified: Secondary | ICD-10-CM | POA: Insufficient documentation

## 2021-01-17 DIAGNOSIS — R1310 Dysphagia, unspecified: Secondary | ICD-10-CM | POA: Diagnosis not present

## 2021-01-17 DIAGNOSIS — C3490 Malignant neoplasm of unspecified part of unspecified bronchus or lung: Secondary | ICD-10-CM

## 2021-01-17 DIAGNOSIS — G35 Multiple sclerosis: Secondary | ICD-10-CM | POA: Insufficient documentation

## 2021-01-17 DIAGNOSIS — D630 Anemia in neoplastic disease: Secondary | ICD-10-CM | POA: Diagnosis not present

## 2021-01-17 DIAGNOSIS — Z5112 Encounter for antineoplastic immunotherapy: Secondary | ICD-10-CM | POA: Insufficient documentation

## 2021-01-17 DIAGNOSIS — Z800 Family history of malignant neoplasm of digestive organs: Secondary | ICD-10-CM | POA: Insufficient documentation

## 2021-01-17 DIAGNOSIS — K219 Gastro-esophageal reflux disease without esophagitis: Secondary | ICD-10-CM | POA: Insufficient documentation

## 2021-01-17 DIAGNOSIS — G893 Neoplasm related pain (acute) (chronic): Secondary | ICD-10-CM

## 2021-01-17 DIAGNOSIS — F17210 Nicotine dependence, cigarettes, uncomplicated: Secondary | ICD-10-CM | POA: Diagnosis not present

## 2021-01-17 DIAGNOSIS — I871 Compression of vein: Secondary | ICD-10-CM

## 2021-01-17 DIAGNOSIS — K208 Other esophagitis without bleeding: Secondary | ICD-10-CM | POA: Diagnosis not present

## 2021-01-17 DIAGNOSIS — J90 Pleural effusion, not elsewhere classified: Secondary | ICD-10-CM

## 2021-01-17 DIAGNOSIS — D649 Anemia, unspecified: Secondary | ICD-10-CM

## 2021-01-17 LAB — COMPREHENSIVE METABOLIC PANEL
ALT: 30 U/L (ref 0–44)
AST: 26 U/L (ref 15–41)
Albumin: 2.9 g/dL — ABNORMAL LOW (ref 3.5–5.0)
Alkaline Phosphatase: 75 U/L (ref 38–126)
Anion gap: 12 (ref 5–15)
BUN: 17 mg/dL (ref 8–23)
CO2: 25 mmol/L (ref 22–32)
Calcium: 8.7 mg/dL — ABNORMAL LOW (ref 8.9–10.3)
Chloride: 98 mmol/L (ref 98–111)
Creatinine, Ser: 0.52 mg/dL (ref 0.44–1.00)
GFR, Estimated: >60 mL/min (ref >60)
Glucose, Bld: 80 mg/dL (ref 70–99)
Potassium: 3.9 mmol/L (ref 3.5–5.1)
Sodium: 135 mmol/L (ref 135–145)
Total Bilirubin: 0.4 mg/dL (ref 0.3–1.2)
Total Protein: 7.3 g/dL (ref 6.5–8.1)

## 2021-01-17 LAB — CBC WITH DIFFERENTIAL/PLATELET
Abs Immature Granulocytes: 0.01 10*3/uL (ref 0.00–0.07)
Basophils Absolute: 0 10*3/uL (ref 0.0–0.1)
Basophils Relative: 0 %
Eosinophils Absolute: 0.1 10*3/uL (ref 0.0–0.5)
Eosinophils Relative: 2 %
HCT: 34.8 % — ABNORMAL LOW (ref 36.0–46.0)
Hemoglobin: 11.5 g/dL — ABNORMAL LOW (ref 12.0–15.0)
Immature Granulocytes: 0 %
Lymphocytes Relative: 30 %
Lymphs Abs: 0.9 10*3/uL (ref 0.7–4.0)
MCH: 26.5 pg (ref 26.0–34.0)
MCHC: 33 g/dL (ref 30.0–36.0)
MCV: 80.2 fL (ref 80.0–100.0)
Monocytes Absolute: 0.3 10*3/uL (ref 0.1–1.0)
Monocytes Relative: 10 %
Neutro Abs: 1.8 10*3/uL (ref 1.7–7.7)
Neutrophils Relative %: 58 %
Platelets: 138 10*3/uL — ABNORMAL LOW (ref 150–400)
RBC: 4.34 MIL/uL (ref 3.87–5.11)
RDW: 18.6 % — ABNORMAL HIGH (ref 11.5–15.5)
WBC: 3.2 10*3/uL — ABNORMAL LOW (ref 4.0–10.5)
nRBC: 0 % (ref 0.0–0.2)

## 2021-01-17 MED ORDER — CYANOCOBALAMIN 1000 MCG/ML IJ SOLN
1000.0000 ug | Freq: Once | INTRAMUSCULAR | Status: AC
  Administered 2021-01-17: 1000 ug via INTRAMUSCULAR
  Filled 2021-01-17: qty 1

## 2021-01-17 MED ORDER — SODIUM CHLORIDE 0.9 % IV SOLN
Freq: Once | INTRAVENOUS | Status: AC
  Filled 2021-01-17: qty 250

## 2021-01-17 NOTE — Progress Notes (Signed)
Hematology/Oncology Follow Up Note [facility]  [phone] [phone]  Patient Care Team: Ranae Plumber, [place] as PCP - General (Family Medicine) Telford Nab, RN as Oncology Nurse Navigator [name], MD as Medical Oncologist (Oncology)   Name of the patient: Brittney Fisher  [id number]  05/10/1961   REASON FOR VISIT  follow-up for lung cancer  PERTINENT ONCOLOGY HISTORY 60 y.o. female with past medical history including GERD, COPD, current everyday smoker presents for follow-up of lung mass and pleural effusion. 10/20/2020, patient was brought to ED via EMS due to generalized weakness, dizziness, chest pain shortness of breath.  She was recently diagnosed with COPD approximately 1 month ago by primary care provider.  Also unintentional weight loss during the last few months. Image work-up showed right-sided pleural effusion.  She underwent right thoracentesis.  With removal of 2.4 L of fluid. 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  Metastatic disease to low cervical/thoracic nodes, lung, right pleural space and bones.  Moderate to large right pleural effusion.  SVC narrowing. 10/21/2020 MRI brain is negative for metastasis.  Mild chronic microvascular ischemic changes in the white matter.  Negative for acute infarct Regarding to the SVC narrowing, patient was seen by vascular surgeon and was recommended no intervention inpatient.  Patient to follow-up outpatient with vascular surgeon for evaluation. Patient also was treated for COPD exacerbation with hypoxia on exertion. Qualifies for home oxygen and hospitalist arrange home health and a nebulizer.  Patient was given a course of prednisone taper and empiric Levaquin. Patient was discharged and present today to follow-up with cytology results and further management plan  10/27/2020 PET scan showed right  upper lobe primary bronchogenic mass with direct invasion into mediastinum. Metastatic disease to the right pleural space, lung, bone, nodes of the chest and less so lower leg/upper abdomen. Hypermetabolic him along the course of the right axillary vein with concurrent subtle hyper attenuation within the axillary vein and SVC.  This continues to the level of SVC narrowing.  Suspicious for SVC occlusion and developing thrombus. Moderate right pleural effusion and small pericardial effusion.  # # left supraclavicular mass biopsy- pathology is positive for adenocarcinoma. positive for CK7, with patchy, weak CK20. They are  negative for TTF1, NapsinA, GATA3, CDX2, and Pax8. The findings are  nonspecific; but may be compatible with a poorly differentiated  adenocarcinoma of lung origin, especially given the imaging findings  She also had another repeat thoracentesis and fluid cytology showed malignancy.  #NGS showed AXIN 1 [id number], DIS2 M173f, KRAS G12C, PRDM1 M1 [id number], [id number] MS stable, TPS <1%    11/14/2020- 12/12/2020. palliative chest RT    #After 2 cycles of chemotherapy, patient had CT scan done due to shortness of breath.  01/05/2021, CT chest angiogram PE protocol showed no evidence of pulmonary embolism.  Stable disease.  Right upper lobe mass about 6.7 x 5.3 cm.  Widespread metastatic disease, lung and bone involvement. Interval development of right-sided hydronephrosis.   INTERVAL HISTORY Brittney STOCKINGERis a 60y.o. female who has above history reviewed by me today presents for follow up visit for management of metastatic lung cancer, acute visit for swallowing pain.  Problems and complaints are listed below:  Odynophagia, overall improved. Patient reports that she needs to clear her throat intermittently, feels mucus in her throat.  She is able to cough mucus out.  Clear color. right rib cage pain with cough.  She takes oxycodone 5 mg every 4 hours as needed for pain.   She was recommended to try fentanyl patch and she declined.    Review of Systems  Constitutional: Positive for appetite change and fatigue. Negative for chills, fever and unexpected weight change.  HENT:   Negative for hearing loss and voice change.        Odynophagia  Eyes: Negative for eye problems.  Respiratory: Positive for cough. Negative for chest tightness and shortness of breath.   Cardiovascular: Negative for chest pain.  Gastrointestinal: Negative for abdominal distention, abdominal pain and blood in stool.  Endocrine: Negative for hot flashes.  Genitourinary: Negative for difficulty urinating and frequency.   Musculoskeletal: Negative for arthralgias.  Skin: Negative for itching and rash.  Neurological: Negative for extremity weakness.  Hematological: Negative for adenopathy.  Psychiatric/Behavioral: Negative for confusion.      No Known Allergies   Past Medical History:  Diagnosis Date  . COPD (chronic obstructive pulmonary disease) ([place])   . GERD (gastroesophageal reflux disease)   . Metastatic lung cancer (metastasis from lung to other site) [facility]) 11/02/2020     Past Surgical History:  Procedure Laterality Date  . COLONOSCOPY WITH ESOPHAGOGASTRODUODENOSCOPY (EGD)    . COLONOSCOPY WITH PROPOFOL N/A 11/14/2015   Procedure: COLONOSCOPY WITH PROPOFOL;  Surgeon: Hulen Luster, MD;  Location: [facility] ENDOSCOPY;  Service: Gastroenterology;  Laterality: N/A;  . ESOPHAGOGASTRODUODENOSCOPY (EGD) WITH PROPOFOL N/A 11/14/2015   Procedure: ESOPHAGOGASTRODUODENOSCOPY (EGD) WITH PROPOFOL;  Surgeon: Hulen Luster, MD;  Location: [facility] ENDOSCOPY;  Service: Gastroenterology;  Laterality: N/A;    Social History   Socioeconomic History  . Marital status: Married    Spouse name: Not on file  . Number of children: Not on file  . Years of education: Not on file  . Highest education level: Not on file  Occupational History  . Not on file  Tobacco Use  . Smoking status: Current Every  Day Smoker    Packs/day: 0.20    Types: Cigarettes  . Smokeless tobacco: Never Used  Substance and Sexual Activity  . Alcohol use: Never  . Drug use: Never  . Sexual activity: Not Currently  Other Topics Concern  . Not on file  Social History Narrative  . Not on file   Social Determinants of Health   Financial Resource Strain: Not on file  Food Insecurity: Not on file  Transportation Needs: Not on file  Physical Activity: Not on file  Stress: Not on file  Social Connections: Not on file  Intimate Partner Violence: Not on file    Family History  Problem Relation Age of Onset  . Breast cancer Cousin 16  . Diabetes type II Mother   . Hypertension Mother   . Colon cancer Father   . Hypertension Father      Current Outpatient Medications:  .  acetaminophen (TYLENOL) 500 MG tablet, Take 1,000 mg by mouth every 8 (eight) hours as needed for moderate pain. , Disp: , Rfl:  .  amoxicillin-clavulanate (AUGMENTIN) 875-125 MG tablet, Take 1 tablet by mouth 2 (two) times daily., Disp: 28 tablet, Rfl: 0 .  azithromycin (ZITHROMAX) 250 MG tablet, 2 tabs on day. 1 tab days 2-5. (Patient not taking: Reported on 01/09/2021), Disp: 6 each, Rfl: 0 .  benzonatate (TESSALON) 100 MG capsule, Take 1 capsule (100 mg total) by mouth 3 (three) times daily as needed for cough., Disp: 60 capsule, Rfl: 1 .  dexamethasone (DECADRON) 4 MG tablet, Take 1 tab (  4 mg) twice a day the day before Alimta chemo. Take 2 tablets (8 mg) po daily x 3 days starting the day after chemo., Disp: 30 tablet, Rfl: 1 .  feeding supplement (ENSURE ENLIVE / ENSURE PLUS) LIQD, Take 237 mLs by mouth 2 (two) times daily between meals., Disp: 237 mL, Rfl: 12 .  fentaNYL (DURAGESIC) 25 MCG/HR, Place 1 patch onto the skin every 3 (three) days. (Patient not taking: No sig reported), Disp: 2 patch, Rfl: 0 .  folic acid (FOLVITE) 1 MG tablet, Take 1 tablet (1 mg total) by mouth daily. Start 5-7 days before Alimta chemotherapy. Continue  until 21 days after Alimta completed., Disp: 100 tablet, Rfl: 3 .  guaiFENesin (MUCINEX) 600 MG 12 hr tablet, Take 1 tablet (600 mg total) by mouth 2 (two) times daily., Disp: 14 tablet, Rfl: 0 .  guaiFENesin-codeine (CHERATUSSIN AC) 100-10 MG/5ML syrup, Take 5 mLs by mouth 3 (three) times daily as needed for cough., Disp: 120 mL, Rfl: 0 .  HYDROcodone-homatropine (HYCODAN) 5-1.5 MG/5ML syrup, Take 5 mLs by mouth every 6 (six) hours as needed for cough., Disp: 120 mL, Rfl: 0 .  ipratropium-albuterol (DUONEB) 0.5-2.5 (3) MG/3ML SOLN, Take 3 mLs by nebulization 3 (three) times daily., Disp: 360 mL, Rfl: 1 .  magic mouthwash w/lidocaine SOLN, Take 5 mLs by mouth 4 (four) times daily as needed for mouth pain. Sig: Swish/Swallow 5-10 ml four times a day as needed. Dispense 480 ml. 1RF, Disp: 480 mL, Rfl: 1 .  omeprazole (PRILOSEC) 20 MG capsule, Take 1 capsule (20 mg total) by mouth 2 (two) times daily before a meal., Disp: 60 capsule, Rfl: 1 .  ondansetron (ZOFRAN) 8 MG tablet, Take 1 tablet (8 mg total) by mouth 2 (two) times daily as needed for refractory nausea / vomiting. Start on day 3 after chemo. (Patient not taking: No sig reported), Disp: 30 tablet, Rfl: 1 .  oxyCODONE (OXY IR/ROXICODONE) 5 MG immediate release tablet, Take 1 tablet (5 mg total) by mouth every 4 (four) hours as needed for severe pain., Disp: 60 tablet, Rfl: 0 .  Polyethylene Glycol 3350 (MIRALAX PO), Take by mouth daily as needed., Disp: , Rfl:  .  predniSONE (STERAPRED UNI-PAK 21 TAB) 10 MG (21) TBPK tablet, Take as directed. (Patient not taking: Reported on 01/09/2021), Disp: 21 tablet, Rfl: 0 .  prochlorperazine (COMPAZINE) 10 MG tablet, Take 1 tablet (10 mg total) by mouth every 6 (six) hours as needed (Nausea or vomiting). (Patient not taking: No sig reported), Disp: 30 tablet, Rfl: 1 .  senna-docusate (SENOKOT-S) 8.6-50 MG tablet, Take 2 tablets by mouth daily., Disp: 60 tablet, Rfl: 3 .  sucralfate (CARAFATE) 1 GM/10ML  suspension, Take 10 mLs (1 g total) by mouth in the morning, at noon, and at bedtime. (Patient not taking: No sig reported), Disp: 420 mL, Rfl: 0 .  tiotropium (SPIRIVA HANDIHALER) 18 MCG inhalation capsule, Place 18 mcg into inhaler and inhale daily., Disp: , Rfl:   Physical exam: ECOG 2 Vitals:   01/17/21 1326  BP: 100/71  Pulse: (!) 116  Resp: 18  Temp: 98.6 F (37 C)  Weight: 128 lb 11.2 oz (58.4 kg)   Physical Exam Constitutional:      General: She is not in acute distress.    Comments: Thin built female walks independently  HENT:     Head: Normocephalic and atraumatic.  Eyes:     General: No scleral icterus. Cardiovascular:     Rate and Rhythm: Regular  rhythm. Tachycardia present.     Heart sounds: Normal heart sounds.  Pulmonary:     Effort: Pulmonary effort is normal. No respiratory distress.     Breath sounds: No wheezing.     Comments: Decreased breath sounds in right lower lung Abdominal:     General: Bowel sounds are normal. There is no distension.     Palpations: Abdomen is soft.  Musculoskeletal:        General: No deformity. Normal range of motion.     Cervical back: Normal range of motion and neck supple.  Skin:    General: Skin is warm and dry.     Findings: No erythema or rash.  Neurological:     Mental Status: She is alert and oriented to person, place, and time. Mental status is at baseline.     Cranial Nerves: No cranial nerve deficit.     Coordination: Coordination normal.  Psychiatric:        Mood and Affect: Mood normal.     CMP Latest Ref Rng & Units 01/09/2021  Glucose 70 - 99 mg/dL 120(H)  BUN 8 - 23 mg/dL 16  Creatinine 0.44 - 1.00 mg/dL 0.53  Sodium 135 - 145 mmol/L 133(L)  Potassium 3.5 - 5.1 mmol/L 4.2  Chloride 98 - 111 mmol/L 100  CO2 22 - 32 mmol/L 26  Calcium 8.9 - 10.3 mg/dL 8.4(L)  Total Protein 6.5 - 8.1 g/dL 6.8  Total Bilirubin 0.3 - 1.2 mg/dL 0.4  Alkaline Phos 38 - 126 U/L 84  AST 15 - 41 U/L 21  ALT 0 - 44 U/L 18    CBC Latest Ref Rng & Units 01/09/2021  WBC 4.0 - 10.5 K/uL 7.5  Hemoglobin 12.0 - 15.0 g/dL 10.7(L)  Hematocrit 36.0 - 46.0 % 32.6(L)  Platelets 150 - 400 K/uL 189    RADIOGRAPHIC STUDIES: I have personally reviewed the radiological images as listed and agreed with the findings in the report. DG Chest 2 View  Result Date: 12/30/2020 CLINICAL DATA:  Cough for 2 weeks history of metastatic lung cancer EXAM: CHEST - 2 VIEW COMPARISON:  10/31/2020, CT 10/18/2020 FINDINGS: Grossly stable appearance of the chest as compared with 10/31/2020. residual right pleural effusion/pleural disease with right hilar mass and upper lobe collapse or consolidation. Numerous metastatic pulmonary nodules as demonstrated on prior CT. Possible increasing airspace disease at the right lower lung. Stable cardiomediastinal silhouette. IMPRESSION: Similar appearance of the chest compared with 10/31/2020 with residual right pleural effusion/pleural disease, right hilar mass and upper lobe collapse or consolidation. Numerous metastatic pulmonary nodules. Possible increasing airspace disease at the right lower lung. Electronically Signed   By: Donavan Foil M.D.   On: 12/30/2020 16:31   CT ANGIO CHEST PE W OR WO CONTRAST  Result Date: 01/05/2021 CLINICAL DATA:  60 year old female with history of lung cancer presenting with shortness of breath and cough for several weeks. Evaluate for pulmonary embolism. EXAM: CT ANGIOGRAPHY CHEST WITH CONTRAST TECHNIQUE: Multidetector CT imaging of the chest was performed using the standard protocol during bolus administration of intravenous contrast. Multiplanar CT image reconstructions and MIPs were obtained to evaluate the vascular anatomy. CONTRAST:  27m ISOVUE-370 IOPAMIDOL (ISOVUE-370) INJECTION 76% COMPARISON:  Chest CT 10/18/2020.  PET-CT 10/27/2020. FINDINGS: Cardiovascular: No filling defects within the pulmonary arterial tree to suggest underlying pulmonary embolism. Heart size is  normal. There is no significant pericardial fluid, thickening or pericardial calcification. Aortic atherosclerosis. No definite coronary artery calcifications. Mediastinum/Nodes: Extensive mediastinal and bilateral hilar lymphadenopathy  again noted. Bulkiest lymphadenopathy is in the low right paratracheal nodal station measuring up to 2.6 cm in short axis. Left hilar lymphadenopathy up to 1.3 cm in short axis. Soft tissue throughout the right hilar region indistinguishable from adjacent pulmonary masses, presumably reflective of hilar lymphadenopathy. Esophagus is unremarkable in appearance. No axillary lymphadenopathy. Lungs/Pleura: Widespread pleural nodularity and enhancement in the right hemithorax, similar to the prior study, indicative of metastatic disease to the pleura. Previously noted right malignant pleural effusion has decreased substantially in size, currently small and partially loculated. There has been re-expansion of the right lower lobe. There continues to be considerable atelectasis in portions of the right middle lobe and complete opacification of the right upper lobe which likely reflects a combination of an underlying mass as well as postobstructive changes. The mass is difficult to delineate on today's examination, but estimated to measure approximately 6.7 x 5.3 cm. Widespread metastatic nodularity scattered throughout the lungs bilaterally, similar in number to the prior examination some of these lesions appears slightly smaller than the prior study, while other lesions appears slightly larger than the prior examination, overall essentially stable. Upper Abdomen: Probable right-sided hydronephrosis incompletely imaged. Aortic atherosclerosis. Musculoskeletal: Numerous predominantly mixed lytic and sclerotic lesions are again noted throughout the visualized axial and appendicular skeleton, largest of which is in the manubrium, similar to the prior study. Some lesions including a lytic  expansile lesion in the lateral aspect of the right fifth rib (axial image 129 of series 13) appear increased in size compared to the prior study. Review of the MIP images confirms the above findings. IMPRESSION: 1. No evidence of pulmonary embolism. 2. Right upper lobe lung cancer with widespread metastatic disease in the thorax including numerous bilateral pulmonary nodules, malignant right pleural disease, and bilateral hilar and mediastinal lymphadenopathy, as detailed above. Metastatic disease to the bones appears slightly progressive compared to prior studies, as above. 3. Interval development of what appears to be right-sided hydronephrosis. Further evaluation with CT the abdomen and pelvis with IV contrast is suggested to better evaluate this finding. 4. Aortic atherosclerosis. Aortic Atherosclerosis (ICD10-I70.0). Electronically Signed   By: Vinnie Langton M.D.   On: 01/05/2021 09:40     Assessment and plan  1. Primary malignant neoplasm of lung metastatic to other site, unspecified laterality ([place])   2. Neoplasm related pain   3. Hydronephrosis, unspecified hydronephrosis type   4. Anemia, unspecified type   5. SVC obstruction   6. Pleural effusion   7. 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  cT4 N3 M1-  KRASG12C (Lumakras in subsequent line can be considered) Chemotherapy  carboplatin and Alimta, bevacizumab Status post 3 cycles of carboplatin/Alimta/bevacizumab. Patient will proceed with B12 injection today.  Radiation esophagitis, continue omeprazole, Carafate.  Magic mouthwash swish and swallow prior to eating.  Symptoms are slightly better.  Decreased oral intake, tachycardia.  Patient will  get IV fluid normal saline 1 L Over 1 hour today.  Encourage oral hydration.    Cough, intermittent shortness of breath, no PE on CT angiogram. CT scan images were independently reviewed by me and discussed with patient Stable disease.  No pulmonary embolism.  Persistent right upper lobe mass/obstruction. Finished a course of Augmentin twice daily  for 2 weeks for treatment of possible obstructive pneumonia.  #Neoplasm related pain/treatment complication-pain appears to be stable. Continue oxycodone 5 mg every 4 hours as needed.  Discussed about option of liquid morphine and patient declined.  Patient does not want to use fentanyl patches.  #Bronchial obstruction and SVC occlusion.  s/p palliative radiation #Anemia, likely secondary to chemotherapy radiation and underlying malignancy.  Hemoglobin 11.5.  Stable.  #Right pleural effusion, stable. #Decreased appetite, recommend patient to use dexamethasone 4 mg twice daily for 4 days after this round of chemo. #Hydronephrosis, pending CT abdomen pelvis with contrast for further evaluation.   Lab MD IV fluid in 1 week.  Lab MD Hollie Salk, bevacizumab,2 weeks  [name], MD, PhD Hematology Oncology [facility] at [facility] Pager- [phone] 01/17/2021

## 2021-01-17 NOTE — Progress Notes (Signed)
Pt received IV hydration this afternoon. No complaints at time of discharge. Ambulatory.

## 2021-01-17 NOTE — Progress Notes (Signed)
Radiation Oncology Follow up Note  Name: Brittney Fisher   Date:   01/17/2021 MRN:  [id number] DOB: 02/18/1961    This 60 y.o. female presents to the clinic today for 1 month follow-up status post radiation therapy to present SVC and patient with stage IV adenocarcinoma of the right lung.  REFERRING PROVIDER: Ranae Plumber, PA  HPI: Patient is a 60 year old female seen today in the clinic receiving fluids.  She completed radiation therapy to prevent SVC the patient with known stage IV adenocarcinoma the right lung..  She is currently under the direction of medical oncology receivingcarboplatin and Alimta, bevacizumab.  Radiation esophagitis seems to have cleared she does have still dry cough.  Recent CT scan of the chest which I have reviewed shows widespread metastatic disease in the thorax with also disease involving bones and new onset right hydronephrosis.  COMPLICATIONS OF TREATMENT: none  FOLLOW UP COMPLIANCE: keeps appointments   PHYSICAL EXAM:  There were no vitals taken for this visit. Frail-appearing female in NAD.  Well-developed well-nourished patient in NAD. HEENT reveals PERLA, EOMI, discs not visualized.  Oral cavity is clear. No oral mucosal lesions are identified. Neck is clear without evidence of cervical or supraclavicular adenopathy. Lungs are clear to A&P. Cardiac examination is essentially unremarkable with regular rate and rhythm without murmur rub or thrill. Abdomen is benign with no organomegaly or masses noted. Motor sensory and DTR levels are equal and symmetric in the upper and lower extremities. Cranial nerves II through XII are grossly intact. Proprioception is intact. No peripheral adenopathy or edema is identified. No motor or sensory levels are noted. Crude visual fields are within normal range.  RADIOLOGY RESULTS: CT scan reviewed compatible with above-stated findings  PLAN: Present time patient has progressive stage IV disease.  She is stable under treatment  with medical oncology at this time I went to turn follow-up care over to medical oncology be happy to reevaluate the patient in time should further palliative treatment be indicated.  I would like to take this opportunity to thank you for allowing me to participate in the care of your patient.Noreene Filbert, MD

## 2021-01-17 NOTE — Progress Notes (Signed)
Patient here for follow up. Pt reports she has to constantly clear her throat.

## 2021-01-19 ENCOUNTER — Inpatient Hospital Stay: Payer: BC Managed Care – PPO

## 2021-01-19 ENCOUNTER — Other Ambulatory Visit: Payer: Self-pay

## 2021-01-19 ENCOUNTER — Ambulatory Visit
Admission: RE | Admit: 2021-01-19 | Discharge: 2021-01-19 | Disposition: A | Discharge status: Home / Self Care | Payer: BC Managed Care – PPO | Source: Ambulatory Visit | Attending: Oncology | Admitting: Oncology

## 2021-01-19 DIAGNOSIS — N1330 Unspecified hydronephrosis: Secondary | ICD-10-CM

## 2021-01-19 DIAGNOSIS — C3490 Malignant neoplasm of unspecified part of unspecified bronchus or lung: Secondary | ICD-10-CM

## 2021-01-19 NOTE — Progress Notes (Signed)
Nutrition Follow-up:  Patient with metastatic lung cancer and malignant pleural effusion.  Patient has completed radiation.   Spoke with patient's sister via phone and patient on phone as well. Sister did the talking as when patient talks gets to coughing.  Sister reports appetite is better.  Had appointment this am that she was NPO for so ate some rice, beans, lettuce, steak when she got back. Likes watermelon. Drinks premier protein shakes and ensure but does not really like the ensure.  Eating ice cream and popsciles.      Medications: reviewed  Labs: reviewed  Anthropometrics:   Weight 128 lb decreased from 134 lb on 12/16   NUTRITION DIAGNOSIS: Inadequate oral intake continues   INTERVENTION:  Encouraged adding ice cream to premier protein shakes to increase calories and protein. Discussed ways to add calories and protein    MONITORING, EVALUATION, GOAL: weight trends, intake   NEXT VISIT: March 3rd phone call  Cerinity Zynda B. [name], [place], [place] Registered Dietitian [phone] (mobile)

## 2021-01-20 ENCOUNTER — Ambulatory Visit
Admission: RE | Admit: 2021-01-20 | Discharge: 2021-01-20 | Disposition: A | Discharge status: Home / Self Care | Payer: BC Managed Care – PPO | Source: Ambulatory Visit | Attending: Oncology | Admitting: Oncology

## 2021-01-20 DIAGNOSIS — N1330 Unspecified hydronephrosis: Secondary | ICD-10-CM | POA: Diagnosis present

## 2021-01-20 DIAGNOSIS — C3490 Malignant neoplasm of unspecified part of unspecified bronchus or lung: Secondary | ICD-10-CM | POA: Insufficient documentation

## 2021-01-20 MED ORDER — IOHEXOL 300 MG/ML  SOLN
75.0000 mL | Freq: Once | INTRAMUSCULAR | Status: AC | PRN
  Administered 2021-01-20: 75 mL via INTRAVENOUS

## 2021-01-25 ENCOUNTER — Other Ambulatory Visit: Payer: BC Managed Care – PPO

## 2021-01-25 ENCOUNTER — Ambulatory Visit: Payer: BC Managed Care – PPO

## 2021-01-26 ENCOUNTER — Telehealth: Payer: Self-pay | Admitting: Oncology

## 2021-01-26 ENCOUNTER — Inpatient Hospital Stay: Payer: BC Managed Care – PPO

## 2021-01-26 VITALS — BP 120/65 | HR 102 | Temp 98.1°F | Resp 18

## 2021-01-26 DIAGNOSIS — Z5112 Encounter for antineoplastic immunotherapy: Secondary | ICD-10-CM | POA: Diagnosis not present

## 2021-01-26 DIAGNOSIS — C3490 Malignant neoplasm of unspecified part of unspecified bronchus or lung: Secondary | ICD-10-CM

## 2021-01-26 LAB — COMPREHENSIVE METABOLIC PANEL
ALT: 14 U/L (ref 0–44)
AST: 17 U/L (ref 15–41)
Albumin: 2.7 g/dL — ABNORMAL LOW (ref 3.5–5.0)
Alkaline Phosphatase: 69 U/L (ref 38–126)
Anion gap: 8 (ref 5–15)
BUN: 15 mg/dL (ref 8–23)
CO2: 27 mmol/L (ref 22–32)
Calcium: 8.5 mg/dL — ABNORMAL LOW (ref 8.9–10.3)
Chloride: 100 mmol/L (ref 98–111)
Creatinine, Ser: 0.59 mg/dL (ref 0.44–1.00)
GFR, Estimated: >60 mL/min (ref >60)
Glucose, Bld: 86 mg/dL (ref 70–99)
Potassium: 4.2 mmol/L (ref 3.5–5.1)
Sodium: 135 mmol/L (ref 135–145)
Total Bilirubin: 0.3 mg/dL (ref 0.3–1.2)
Total Protein: 7.4 g/dL (ref 6.5–8.1)

## 2021-01-26 LAB — CBC WITH DIFFERENTIAL/PLATELET
Abs Immature Granulocytes: 0 10*3/uL (ref 0.00–0.07)
Basophils Absolute: 0 10*3/uL (ref 0.0–0.1)
Basophils Relative: 1 %
Eosinophils Absolute: 0 10*3/uL (ref 0.0–0.5)
Eosinophils Relative: 1 %
HCT: 32.2 % — ABNORMAL LOW (ref 36.0–46.0)
Hemoglobin: 10.6 g/dL — ABNORMAL LOW (ref 12.0–15.0)
Immature Granulocytes: 0 %
Lymphocytes Relative: 34 %
Lymphs Abs: 1 10*3/uL (ref 0.7–4.0)
MCH: 26.7 pg (ref 26.0–34.0)
MCHC: 32.9 g/dL (ref 30.0–36.0)
MCV: 81.1 fL (ref 80.0–100.0)
Monocytes Absolute: 0.5 10*3/uL (ref 0.1–1.0)
Monocytes Relative: 18 %
Neutro Abs: 1.3 10*3/uL — ABNORMAL LOW (ref 1.7–7.7)
Neutrophils Relative %: 46 %
Platelets: 203 10*3/uL (ref 150–400)
RBC: 3.97 MIL/uL (ref 3.87–5.11)
RDW: 19.1 % — ABNORMAL HIGH (ref 11.5–15.5)
WBC: 2.8 10*3/uL — ABNORMAL LOW (ref 4.0–10.5)
nRBC: 0 % (ref 0.0–0.2)

## 2021-01-26 LAB — PROTEIN, URINE, RANDOM: Total Protein, Urine: 14 mg/dL

## 2021-01-26 MED ORDER — SODIUM CHLORIDE 0.9 % IV SOLN
Freq: Once | INTRAVENOUS | Status: AC
  Filled 2021-01-26: qty 250

## 2021-01-26 NOTE — Progress Notes (Signed)
Pt receiving IV hydration. No complaints of pain.

## 2021-01-26 NOTE — Telephone Encounter (Signed)
Pt called to see if possible to r/s her appt on 2/14 to 2/18. Pt is coming in for labs this afternoon and will follow/up.

## 2021-01-27 ENCOUNTER — Other Ambulatory Visit: Payer: Self-pay | Admitting: *Deleted

## 2021-01-27 MED ORDER — BENZONATATE 100 MG PO CAPS: 100.0000 mg | ORAL_CAPSULE | Freq: Three times a day (TID) | ORAL | 1 refills | Status: DC | PRN

## 2021-01-27 MED ORDER — OXYCODONE HCL 5 MG PO TABS: 5.0000 mg | ORAL_TABLET | ORAL | 0 refills | Status: DC | PRN

## 2021-01-30 ENCOUNTER — Inpatient Hospital Stay: Payer: BC Managed Care – PPO

## 2021-01-30 ENCOUNTER — Inpatient Hospital Stay: Payer: BC Managed Care – PPO | Admitting: Oncology

## 2021-02-03 ENCOUNTER — Inpatient Hospital Stay: Payer: BC Managed Care – PPO

## 2021-02-03 ENCOUNTER — Encounter: Payer: Self-pay | Admitting: Oncology

## 2021-02-03 ENCOUNTER — Inpatient Hospital Stay (HOSPITAL_BASED_OUTPATIENT_CLINIC_OR_DEPARTMENT_OTHER): Payer: BC Managed Care – PPO | Admitting: Oncology

## 2021-02-03 VITALS — BP 100/66 | HR 99 | Temp 98.0°F | Wt 132.8 lb

## 2021-02-03 DIAGNOSIS — Z5112 Encounter for antineoplastic immunotherapy: Secondary | ICD-10-CM | POA: Diagnosis not present

## 2021-02-03 DIAGNOSIS — G893 Neoplasm related pain (acute) (chronic): Secondary | ICD-10-CM

## 2021-02-03 DIAGNOSIS — C3490 Malignant neoplasm of unspecified part of unspecified bronchus or lung: Secondary | ICD-10-CM

## 2021-02-03 DIAGNOSIS — D649 Anemia, unspecified: Secondary | ICD-10-CM | POA: Diagnosis not present

## 2021-02-03 DIAGNOSIS — Z5111 Encounter for antineoplastic chemotherapy: Secondary | ICD-10-CM

## 2021-02-03 DIAGNOSIS — C7951 Secondary malignant neoplasm of bone: Secondary | ICD-10-CM

## 2021-02-03 DIAGNOSIS — I871 Compression of vein: Secondary | ICD-10-CM | POA: Diagnosis not present

## 2021-02-03 LAB — CBC WITH DIFFERENTIAL/PLATELET
Abs Immature Granulocytes: 0.02 10*3/uL (ref 0.00–0.07)
Basophils Absolute: 0 10*3/uL (ref 0.0–0.1)
Basophils Relative: 0 %
Eosinophils Absolute: 0 10*3/uL (ref 0.0–0.5)
Eosinophils Relative: 0 %
HCT: 30.3 % — ABNORMAL LOW (ref 36.0–46.0)
Hemoglobin: 9.7 g/dL — ABNORMAL LOW (ref 12.0–15.0)
Immature Granulocytes: 0 %
Lymphocytes Relative: 12 %
Lymphs Abs: 0.8 10*3/uL (ref 0.7–4.0)
MCH: 26.6 pg (ref 26.0–34.0)
MCHC: 32 g/dL (ref 30.0–36.0)
MCV: 83.2 fL (ref 80.0–100.0)
Monocytes Absolute: 0.5 10*3/uL (ref 0.1–1.0)
Monocytes Relative: 8 %
Neutro Abs: 5.4 10*3/uL (ref 1.7–7.7)
Neutrophils Relative %: 80 %
Platelets: 204 10*3/uL (ref 150–400)
RBC: 3.64 MIL/uL — ABNORMAL LOW (ref 3.87–5.11)
RDW: 19.3 % — ABNORMAL HIGH (ref 11.5–15.5)
WBC: 6.7 10*3/uL (ref 4.0–10.5)
nRBC: 0 % (ref 0.0–0.2)

## 2021-02-03 LAB — COMPREHENSIVE METABOLIC PANEL
ALT: 15 U/L (ref 0–44)
AST: 21 U/L (ref 15–41)
Albumin: 2.7 g/dL — ABNORMAL LOW (ref 3.5–5.0)
Alkaline Phosphatase: 73 U/L (ref 38–126)
Anion gap: 9 (ref 5–15)
BUN: 19 mg/dL (ref 8–23)
CO2: 24 mmol/L (ref 22–32)
Calcium: 8.4 mg/dL — ABNORMAL LOW (ref 8.9–10.3)
Chloride: 102 mmol/L (ref 98–111)
Creatinine, Ser: 0.54 mg/dL (ref 0.44–1.00)
GFR, Estimated: >60 mL/min (ref >60)
Glucose, Bld: 97 mg/dL (ref 70–99)
Potassium: 3.8 mmol/L (ref 3.5–5.1)
Sodium: 135 mmol/L (ref 135–145)
Total Bilirubin: 0.4 mg/dL (ref 0.3–1.2)
Total Protein: 7 g/dL (ref 6.5–8.1)

## 2021-02-03 MED ORDER — SODIUM CHLORIDE 0.9 % IV SOLN
150.0000 mg | Freq: Once | INTRAVENOUS | Status: AC
  Administered 2021-02-03: 150 mg via INTRAVENOUS
  Filled 2021-02-03: qty 150

## 2021-02-03 MED ORDER — SODIUM CHLORIDE 0.9 % IV SOLN
503.5000 mg | Freq: Once | INTRAVENOUS | Status: AC
  Administered 2021-02-03: 500 mg via INTRAVENOUS
  Filled 2021-02-03: qty 50

## 2021-02-03 MED ORDER — PALONOSETRON HCL INJECTION 0.25 MG/5ML
0.2500 mg | Freq: Once | INTRAVENOUS | Status: AC
  Administered 2021-02-03: 0.25 mg via INTRAVENOUS
  Filled 2021-02-03: qty 5

## 2021-02-03 MED ORDER — SODIUM CHLORIDE 0.9 % IV SOLN
Freq: Once | INTRAVENOUS | Status: AC
  Filled 2021-02-03: qty 250

## 2021-02-03 MED ORDER — SODIUM CHLORIDE 0.9 % IV SOLN
10.0000 mg | Freq: Once | INTRAVENOUS | Status: AC
  Administered 2021-02-03: 10 mg via INTRAVENOUS
  Filled 2021-02-03: qty 10

## 2021-02-03 MED ORDER — SODIUM CHLORIDE 0.9 % IV SOLN
500.0000 mg/m2 | Freq: Once | INTRAVENOUS | Status: AC
  Administered 2021-02-03: 900 mg via INTRAVENOUS
  Filled 2021-02-03: qty 36

## 2021-02-03 MED ORDER — SODIUM CHLORIDE 0.9 % IV SOLN
15.0000 mg/kg | Freq: Once | INTRAVENOUS | Status: AC
  Administered 2021-02-03: 900 mg via INTRAVENOUS
  Filled 2021-02-03: qty 32

## 2021-02-03 NOTE — Progress Notes (Signed)
Patient still has an occasional cough with morning cougestion

## 2021-02-03 NOTE — Progress Notes (Signed)
Pt received prescribed treatment in clinic, pt stable at d/c. 

## 2021-02-03 NOTE — Progress Notes (Signed)
Hematology/Oncology Follow Up Note [facility]  Telephone:([phone] [phone]  Patient Care Team: Ranae Plumber, [place] as PCP - General (Family Medicine) Telford Nab, RN as Oncology Nurse Navigator [name], MD as Medical Oncologist (Oncology)   Name of the patient: Brittney Fisher  [id number]  April 03, 1961   REASON FOR VISIT  follow-up for lung cancer  PERTINENT ONCOLOGY HISTORY 60 y.o. female with past medical history including GERD, COPD, current everyday smoker presents for follow-up of lung mass and pleural effusion. 10/20/2020, patient was brought to ED via EMS due to generalized weakness, dizziness, chest pain shortness of breath.  She was recently diagnosed with COPD approximately 1 month ago by primary care provider.  Also unintentional weight loss during the last few months. Image work-up showed right-sided pleural effusion.  She underwent right thoracentesis.  With removal of 2.4 L of fluid. 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  Metastatic disease to low cervical/thoracic nodes, lung, right pleural space and bones.  Moderate to large right pleural effusion.  SVC narrowing. 10/21/2020 MRI brain is negative for metastasis.  Mild chronic microvascular ischemic changes in the white matter.  Negative for acute infarct Regarding to the SVC narrowing, patient was seen by vascular surgeon and was recommended no intervention inpatient.  Patient to follow-up outpatient with vascular surgeon for evaluation. Patient also was treated for COPD exacerbation with hypoxia on exertion. Qualifies for home oxygen and hospitalist arrange home health and a nebulizer.  Patient was given a course of prednisone taper and empiric Levaquin. Patient was discharged and present today to follow-up with cytology results and further management plan  10/27/2020 PET scan showed right  upper lobe primary bronchogenic mass with direct invasion into mediastinum. Metastatic disease to the right pleural space, lung, bone, nodes of the chest and less so lower leg/upper abdomen. Hypermetabolic him along the course of the right axillary vein with concurrent subtle hyper attenuation within the axillary vein and SVC.  This continues to the level of SVC narrowing.  Suspicious for SVC occlusion and developing thrombus. Moderate right pleural effusion and small pericardial effusion.  # # left supraclavicular mass biopsy- pathology is positive for adenocarcinoma. positive for CK7, with patchy, weak CK20. They are  negative for TTF1, NapsinA, GATA3, CDX2, and Pax8. The findings are  nonspecific; but may be compatible with a poorly differentiated  adenocarcinoma of lung origin, especially given the imaging findings  She also had another repeat thoracentesis and fluid cytology showed malignancy.  #NGS showed AXIN 1 [id number], DIS2 M193f, KRAS G12C, PRDM1 M1 [id number], [id number] MS stable, TPS <1%    11/14/2020- 12/12/2020. palliative chest RT    #After 2 cycles of chemotherapy, patient had CT scan done due to shortness of breath.  01/05/2021, CT chest angiogram PE protocol showed no evidence of pulmonary embolism.  Stable disease.  Right upper lobe mass about 6.7 x 5.3 cm.  Widespread metastatic disease, lung and bone involvement. Interval development of right-sided hydronephrosis.   INTERVAL HISTORY Brittney COTHRONis a 60y.o. female who has above history reviewed by me today presents for follow up visit for management of metastatic lung cancer, acute visit for swallowing pain.  Problems and complaints are listed below:  Odynophagia and dysphagia have both improved.  Appetite is good.  She has gained weight since last visit Chronic cough with whitish sputum, mucus in her throat.  She is able to cough mucus out.  She feels energy level has also slightly improved.    Review of  Systems  Constitutional: Negative for chills, fatigue, fever and unexpected weight change.  HENT:   Negative for hearing loss and voice change.        Odynophagia  Eyes: Negative for eye problems.  Respiratory: Positive for cough. Negative for chest tightness and shortness of breath.   Cardiovascular: Negative for chest pain.  Gastrointestinal: Negative for abdominal distention, abdominal pain and blood in stool.  Endocrine: Negative for hot flashes.  Genitourinary: Negative for difficulty urinating and frequency.   Musculoskeletal: Negative for arthralgias.  Skin: Negative for itching and rash.  Neurological: Negative for extremity weakness.  Hematological: Negative for adenopathy.  Psychiatric/Behavioral: Negative for confusion.      No Known Allergies   Past Medical History:  Diagnosis Date  . COPD (chronic obstructive pulmonary disease) ([place])   . GERD (gastroesophageal reflux disease)   . Metastatic lung cancer (metastasis from lung to other site) [facility]) 11/02/2020     Past Surgical History:  Procedure Laterality Date  . COLONOSCOPY WITH ESOPHAGOGASTRODUODENOSCOPY (EGD)    . COLONOSCOPY WITH PROPOFOL N/A 11/14/2015   Procedure: COLONOSCOPY WITH PROPOFOL;  Surgeon: Hulen Luster, MD;  Location: [facility] ENDOSCOPY;  Service: Gastroenterology;  Laterality: N/A;  . ESOPHAGOGASTRODUODENOSCOPY (EGD) WITH PROPOFOL N/A 11/14/2015   Procedure: ESOPHAGOGASTRODUODENOSCOPY (EGD) WITH PROPOFOL;  Surgeon: Hulen Luster, MD;  Location: [facility] ENDOSCOPY;  Service: Gastroenterology;  Laterality: N/A;    Social History   Socioeconomic History  . Marital status: Married    Spouse name: Not on file  . Number of children: Not on file  . Years of education: Not on file  . Highest education level: Not on file  Occupational History  . Not on file  Tobacco Use  . Smoking status: Current Every Day Smoker    Packs/day: 0.20    Types: Cigarettes  . Smokeless tobacco: Never Used  Substance and Sexual  Activity  . Alcohol use: Never  . Drug use: Never  . Sexual activity: Not Currently  Other Topics Concern  . Not on file  Social History Narrative  . Not on file   Social Determinants of Health   Financial Resource Strain: Not on file  Food Insecurity: Not on file  Transportation Needs: Not on file  Physical Activity: Not on file  Stress: Not on file  Social Connections: Not on file  Intimate Partner Violence: Not on file    Family History  Problem Relation Age of Onset  . Breast cancer Cousin 78  . Diabetes type II Mother   . Hypertension Mother   . Colon cancer Father   . Hypertension Father      Current Outpatient Medications:  .  acetaminophen (TYLENOL) 500 MG tablet, Take 1,000 mg by mouth every 8 (eight) hours as needed for moderate pain. , Disp: , Rfl:  .  benzonatate (TESSALON) 100 MG capsule, Take 1 capsule (100 mg total) by mouth 3 (three) times daily as needed for cough., Disp: 60 capsule, Rfl: 1 .  dexamethasone (DECADRON) 4 MG tablet, Take 1 tab (4 mg) twice a day the day before Alimta chemo. Take 2 tablets (8 mg) po daily x 3 days starting the day after chemo., Disp: 30 tablet, Rfl: 1 .  feeding supplement (ENSURE ENLIVE / ENSURE PLUS) LIQD, Take 237 mLs by mouth 2 (two) times daily between meals., Disp: 237 mL, Rfl: 12 .  folic acid (FOLVITE) 1 MG tablet, Take 1 tablet (  1 mg total) by mouth daily. Start 5-7 days before Alimta chemotherapy. Continue until 21 days after Alimta completed., Disp: 100 tablet, Rfl: 3 .  guaiFENesin (MUCINEX) 600 MG 12 hr tablet, Take 1 tablet (600 mg total) by mouth 2 (two) times daily., Disp: 14 tablet, Rfl: 0 .  HYDROcodone-homatropine (HYCODAN) 5-1.5 MG/5ML syrup, Take 5 mLs by mouth every 6 (six) hours as needed for cough., Disp: 120 mL, Rfl: 0 .  magic mouthwash w/lidocaine SOLN, Take 5 mLs by mouth 4 (four) times daily as needed for mouth pain. Sig: Swish/Swallow 5-10 ml four times a day as needed. Dispense 480 ml. 1RF, Disp: 480  mL, Rfl: 1 .  omeprazole (PRILOSEC) 20 MG capsule, Take 1 capsule (20 mg total) by mouth 2 (two) times daily before a meal., Disp: 60 capsule, Rfl: 1 .  ondansetron (ZOFRAN) 8 MG tablet, Take 1 tablet (8 mg total) by mouth 2 (two) times daily as needed for refractory nausea / vomiting. Start on day 3 after chemo., Disp: 30 tablet, Rfl: 1 .  oxyCODONE (OXY IR/ROXICODONE) 5 MG immediate release tablet, Take 1 tablet (5 mg total) by mouth every 4 (four) hours as needed for severe pain., Disp: 60 tablet, Rfl: 0 .  Polyethylene Glycol 3350 (MIRALAX PO), Take by mouth daily as needed., Disp: , Rfl:  .  prochlorperazine (COMPAZINE) 10 MG tablet, Take 1 tablet (10 mg total) by mouth every 6 (six) hours as needed (Nausea or vomiting)., Disp: 30 tablet, Rfl: 1 .  senna-docusate (SENOKOT-S) 8.6-50 MG tablet, Take 2 tablets by mouth daily., Disp: 60 tablet, Rfl: 3 .  sucralfate (CARAFATE) 1 GM/10ML suspension, Take 10 mLs (1 g total) by mouth in the morning, at noon, and at bedtime., Disp: 420 mL, Rfl: 0 .  tiotropium (SPIRIVA HANDIHALER) 18 MCG inhalation capsule, Place 18 mcg into inhaler and inhale daily., Disp: , Rfl:  .  amoxicillin-clavulanate (AUGMENTIN) 875-125 MG tablet, Take 1 tablet by mouth 2 (two) times daily. (Patient not taking: Reported on 02/03/2021), Disp: 28 tablet, Rfl: 0 .  azithromycin (ZITHROMAX) 250 MG tablet, 2 tabs on day. 1 tab days 2-5. (Patient not taking: No sig reported), Disp: 6 each, Rfl: 0 .  fentaNYL (DURAGESIC) 25 MCG/HR, Place 1 patch onto the skin every 3 (three) days. (Patient not taking: No sig reported), Disp: 2 patch, Rfl: 0 .  guaiFENesin-codeine (CHERATUSSIN AC) 100-10 MG/5ML syrup, Take 5 mLs by mouth 3 (three) times daily as needed for cough. (Patient not taking: Reported on 02/03/2021), Disp: 120 mL, Rfl: 0 .  ipratropium-albuterol (DUONEB) 0.5-2.5 (3) MG/3ML SOLN, Take 3 mLs by nebulization 3 (three) times daily., Disp: 360 mL, Rfl: 1 .  predniSONE (STERAPRED UNI-PAK  21 TAB) 10 MG (21) TBPK tablet, Take as directed. (Patient not taking: No sig reported), Disp: 21 tablet, Rfl: 0  Physical exam: ECOG 2 Vitals:   02/03/21 0836  BP: 100/66  Pulse: 99  Temp: 98 F (36.7 C)  TempSrc: Tympanic  SpO2: 100%  Weight: 132 lb 12.8 oz (60.2 kg)   Physical Exam Constitutional:      General: She is not in acute distress.    Comments: Thin built female walks independently  HENT:     Head: Normocephalic and atraumatic.  Eyes:     General: No scleral icterus. Cardiovascular:     Rate and Rhythm: Normal rate and regular rhythm.     Heart sounds: Normal heart sounds.  Pulmonary:     Effort: Pulmonary effort is normal.  No respiratory distress.     Breath sounds: No wheezing.     Comments: Decreased breath sounds in right lower lung Abdominal:     General: Bowel sounds are normal. There is no distension.     Palpations: Abdomen is soft.  Musculoskeletal:        General: No deformity. Normal range of motion.     Cervical back: Normal range of motion and neck supple.  Skin:    General: Skin is warm and dry.     Findings: No erythema or rash.  Neurological:     Mental Status: She is alert and oriented to person, place, and time. Mental status is at baseline.     Cranial Nerves: No cranial nerve deficit.     Coordination: Coordination normal.  Psychiatric:        Mood and Affect: Mood normal.     CMP Latest Ref Rng & Units 02/03/2021  Glucose 70 - 99 mg/dL 97  BUN 8 - 23 mg/dL 19  Creatinine 0.44 - 1.00 mg/dL 0.54  Sodium 135 - 145 mmol/L 135  Potassium 3.5 - 5.1 mmol/L 3.8  Chloride 98 - 111 mmol/L 102  CO2 22 - 32 mmol/L 24  Calcium 8.9 - 10.3 mg/dL 8.4(L)  Total Protein 6.5 - 8.1 g/dL 7.0  Total Bilirubin 0.3 - 1.2 mg/dL 0.4  Alkaline Phos 38 - 126 U/L 73  AST 15 - 41 U/L 21  ALT 0 - 44 U/L 15   CBC Latest Ref Rng & Units 02/03/2021  WBC 4.0 - 10.5 K/uL 6.7  Hemoglobin 12.0 - 15.0 g/dL 9.7(L)  Hematocrit 36.0 - 46.0 % 30.3(L)  Platelets  150 - 400 K/uL 204    RADIOGRAPHIC STUDIES: I have personally reviewed the radiological images as listed and agreed with the findings in the report. CT ANGIO CHEST PE W OR WO CONTRAST  Result Date: 01/05/2021 CLINICAL DATA:  60 year old female with history of lung cancer presenting with shortness of breath and cough for several weeks. Evaluate for pulmonary embolism. EXAM: CT ANGIOGRAPHY CHEST WITH CONTRAST TECHNIQUE: Multidetector CT imaging of the chest was performed using the standard protocol during bolus administration of intravenous contrast. Multiplanar CT image reconstructions and MIPs were obtained to evaluate the vascular anatomy. CONTRAST:  13mL ISOVUE-370 IOPAMIDOL (ISOVUE-370) INJECTION 76% COMPARISON:  Chest CT 10/18/2020.  PET-CT 10/27/2020. FINDINGS: Cardiovascular: No filling defects within the pulmonary arterial tree to suggest underlying pulmonary embolism. Heart size is normal. There is no significant pericardial fluid, thickening or pericardial calcification. Aortic atherosclerosis. No definite coronary artery calcifications. Mediastinum/Nodes: Extensive mediastinal and bilateral hilar lymphadenopathy again noted. Bulkiest lymphadenopathy is in the low right paratracheal nodal station measuring up to 2.6 cm in short axis. Left hilar lymphadenopathy up to 1.3 cm in short axis. Soft tissue throughout the right hilar region indistinguishable from adjacent pulmonary masses, presumably reflective of hilar lymphadenopathy. Esophagus is unremarkable in appearance. No axillary lymphadenopathy. Lungs/Pleura: Widespread pleural nodularity and enhancement in the right hemithorax, similar to the prior study, indicative of metastatic disease to the pleura. Previously noted right malignant pleural effusion has decreased substantially in size, currently small and partially loculated. There has been re-expansion of the right lower lobe. There continues to be considerable atelectasis in portions of the  right middle lobe and complete opacification of the right upper lobe which likely reflects a combination of an underlying mass as well as postobstructive changes. The mass is difficult to delineate on today's examination, but estimated to measure approximately 6.7 x  5.3 cm. Widespread metastatic nodularity scattered throughout the lungs bilaterally, similar in number to the prior examination some of these lesions appears slightly smaller than the prior study, while other lesions appears slightly larger than the prior examination, overall essentially stable. Upper Abdomen: Probable right-sided hydronephrosis incompletely imaged. Aortic atherosclerosis. Musculoskeletal: Numerous predominantly mixed lytic and sclerotic lesions are again noted throughout the visualized axial and appendicular skeleton, largest of which is in the manubrium, similar to the prior study. Some lesions including a lytic expansile lesion in the lateral aspect of the right fifth rib (axial image 129 of series 13) appear increased in size compared to the prior study. Review of the MIP images confirms the above findings. IMPRESSION: 1. No evidence of pulmonary embolism. 2. Right upper lobe lung cancer with widespread metastatic disease in the thorax including numerous bilateral pulmonary nodules, malignant right pleural disease, and bilateral hilar and mediastinal lymphadenopathy, as detailed above. Metastatic disease to the bones appears slightly progressive compared to prior studies, as above. 3. Interval development of what appears to be right-sided hydronephrosis. Further evaluation with CT the abdomen and pelvis with IV contrast is suggested to better evaluate this finding. 4. Aortic atherosclerosis. Aortic Atherosclerosis (ICD10-I70.0). Electronically Signed   By: Vinnie Langton M.D.   On: 01/05/2021 09:40   CT Abdomen Pelvis W Contrast  Result Date: 01/20/2021 CLINICAL DATA:  Metastatic lung cancer, hydronephrosis EXAM: CT ABDOMEN AND  PELVIS WITH CONTRAST TECHNIQUE: Multidetector CT imaging of the abdomen and pelvis was performed using the standard protocol following bolus administration of intravenous contrast. CONTRAST:  50mL OMNIPAQUE IOHEXOL 300 MG/ML  SOLN COMPARISON:  CT chest angiogram, 01/05/2021, PET-CT, 10/27/2020 FINDINGS: Lower chest: No acute abnormality. Redemonstrated pleural and pulmonary metastatic disease throughout the included lung bases, worse on the right, better detailed by prior CT of the chest. Hepatobiliary: No solid liver abnormality is seen. No gallstones, gallbladder wall thickening, or biliary dilatation. Pancreas: Unremarkable. No pancreatic ductal dilatation or surrounding inflammatory changes. Spleen: Normal in size without significant abnormality. Adrenals/Urinary Tract: Adrenal glands are unremarkable. Delayed phase imaging reveals a parapelvic cyst of the right kidney without evidence of hydronephrosis. Bladder is unremarkable. Stomach/Bowel: Stomach is within normal limits. Appendix is not clearly vision. No evidence of bowel wall thickening, distention, or inflammatory changes. Moderate burden of stool throughout the colon and rectum. Vascular/Lymphatic: Aortic atherosclerosis. No enlarged abdominal or pelvic lymph nodes. Reproductive: No mass or other significant abnormality. Other: No abdominal wall hernia or abnormality. No abdominopelvic ascites. Musculoskeletal: Scattered osseous metastases throughout. IMPRESSION: 1. Delayed phase imaging of the abdomen and pelvis reveals a parapelvic cyst of the right kidney, which does not represent hydronephrosis suspected by prior CT of the chest. 2. Redemonstrated pleural and pulmonary metastatic disease throughout the included lung bases, worse on the right, better detailed by prior CT of the chest. 3. Osseous metastatic disease throughout the included skeleton, not evidently changed compared to prior PET-CT. 4. No evidence of soft tissue metastatic disease or  lymphadenopathy in the abdomen or pelvis. Aortic Atherosclerosis (ICD10-I70.0). Electronically Signed   By: Eddie Candle M.D.   On: 01/20/2021 10:23     Assessment and plan  1. Primary malignant neoplasm of lung metastatic to other site, unspecified laterality ([place])   2. Neoplasm related pain   3. Anemia, unspecified type   4. SVC obstruction   5. Encounter for antineoplastic chemotherapy   6. Bone metastasis ([place])    Cancer Staging Primary malignant neoplasm of lung metastatic to other site [facility]) Staging  form: Lung, AJCC 8th Edition - Clinical stage from 11/04/2020: Stage IV (cT4, cN3, cM1) - Signed by [name], MD on 11/04/2020   #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  cT4 N3 M1-  KRASG12C (Lumakras in subsequent line can be considered) Chemotherapy  carboplatin and Alimta, bevacizumab First-line treatment with carboplatin/Alimta/bevacizumab. Labs reviewed and discussed with patient Proceed with carboplatin Alimta bevacizumab today.  Radiation esophagitis, continue omeprazole.  Symptoms are improving.  Appetite is improved. Encourage oral hydration.  Cough, intermittent shortness of breath, no PE on CT angiogram. Chronic.  Symptoms are stable.  #Neoplasm related pain/treatment complication-pain appears to be stable. Continue oxycodone 5 mg every 4 hours as needed.  Discussed about option of liquid morphine and patient declined.  Patient does not want to use fentanyl patches.  #Bronchial obstruction and SVC occlusion.  s/p palliative radiation #Anemia, likely secondary to chemotherapy radiation and underlying malignancy.  Hemoglobin 9.7.  Stable.  #Right pleural effusion, stable. #Decreased appetite, continue dexamethasone 4 mg twice daily for 4 days after this round of chemo. #Hydronephrosis,  CT abdomen pelvis with contrast images were reviewed and discussed with patient Parapelvic cyst of the right kidney.  No hydronephrosis.  #Bone metastasis.   Plan to discuss with patient about bisphosphonate at the next visit.  Lab MD IV fluid in 1 week.  Lab MD Hollie Salk, bevacizumab,3 weeks  [name], MD, PhD Hematology Oncology [facility] at [facility] Pager- [phone] 02/03/2021

## 2021-02-10 ENCOUNTER — Inpatient Hospital Stay: Payer: BC Managed Care – PPO

## 2021-02-10 ENCOUNTER — Other Ambulatory Visit: Payer: Self-pay

## 2021-02-10 ENCOUNTER — Encounter: Payer: Self-pay | Admitting: Oncology

## 2021-02-10 ENCOUNTER — Inpatient Hospital Stay (HOSPITAL_BASED_OUTPATIENT_CLINIC_OR_DEPARTMENT_OTHER): Payer: BC Managed Care – PPO | Admitting: Oncology

## 2021-02-10 VITALS — BP 101/71 | HR 106 | Temp 98.3°F | Resp 18 | Wt 132.5 lb

## 2021-02-10 DIAGNOSIS — D649 Anemia, unspecified: Secondary | ICD-10-CM

## 2021-02-10 DIAGNOSIS — C3490 Malignant neoplasm of unspecified part of unspecified bronchus or lung: Secondary | ICD-10-CM | POA: Diagnosis not present

## 2021-02-10 DIAGNOSIS — Z5112 Encounter for antineoplastic immunotherapy: Secondary | ICD-10-CM | POA: Diagnosis not present

## 2021-02-10 DIAGNOSIS — C7951 Secondary malignant neoplasm of bone: Secondary | ICD-10-CM

## 2021-02-10 DIAGNOSIS — G893 Neoplasm related pain (acute) (chronic): Secondary | ICD-10-CM | POA: Diagnosis not present

## 2021-02-10 LAB — COMPREHENSIVE METABOLIC PANEL
ALT: 44 U/L (ref 0–44)
AST: 39 U/L (ref 15–41)
Albumin: 3.1 g/dL — ABNORMAL LOW (ref 3.5–5.0)
Alkaline Phosphatase: 83 U/L (ref 38–126)
Anion gap: 10 (ref 5–15)
BUN: 24 mg/dL — ABNORMAL HIGH (ref 8–23)
CO2: 25 mmol/L (ref 22–32)
Calcium: 8.5 mg/dL — ABNORMAL LOW (ref 8.9–10.3)
Chloride: 101 mmol/L (ref 98–111)
Creatinine, Ser: 0.68 mg/dL (ref 0.44–1.00)
GFR, Estimated: >60 mL/min (ref >60)
Glucose, Bld: 94 mg/dL (ref 70–99)
Potassium: 3.8 mmol/L (ref 3.5–5.1)
Sodium: 136 mmol/L (ref 135–145)
Total Bilirubin: 0.4 mg/dL (ref 0.3–1.2)
Total Protein: 7.1 g/dL (ref 6.5–8.1)

## 2021-02-10 LAB — CBC WITH DIFFERENTIAL/PLATELET
Abs Immature Granulocytes: 0.03 10*3/uL (ref 0.00–0.07)
Basophils Absolute: 0 10*3/uL (ref 0.0–0.1)
Basophils Relative: 0 %
Eosinophils Absolute: 0 10*3/uL (ref 0.0–0.5)
Eosinophils Relative: 1 %
HCT: 34.5 % — ABNORMAL LOW (ref 36.0–46.0)
Hemoglobin: 11.2 g/dL — ABNORMAL LOW (ref 12.0–15.0)
Immature Granulocytes: 1 %
Lymphocytes Relative: 25 %
Lymphs Abs: 0.9 10*3/uL (ref 0.7–4.0)
MCH: 27.1 pg (ref 26.0–34.0)
MCHC: 32.5 g/dL (ref 30.0–36.0)
MCV: 83.3 fL (ref 80.0–100.0)
Monocytes Absolute: 0.5 10*3/uL (ref 0.1–1.0)
Monocytes Relative: 13 %
Neutro Abs: 2.2 10*3/uL (ref 1.7–7.7)
Neutrophils Relative %: 60 %
Platelets: 184 10*3/uL (ref 150–400)
RBC: 4.14 MIL/uL (ref 3.87–5.11)
RDW: 19.7 % — ABNORMAL HIGH (ref 11.5–15.5)
WBC: 3.7 10*3/uL — ABNORMAL LOW (ref 4.0–10.5)
nRBC: 0 % (ref 0.0–0.2)

## 2021-02-10 NOTE — Progress Notes (Signed)
Patient here for follow up. Pt reports increased upper back pain 8/10, getting worse.

## 2021-02-10 NOTE — Progress Notes (Signed)
Hematology/Oncology Follow Up Note [facility]  [phone] [phone]  Patient Care Team: Ranae Plumber, [place] as PCP - General (Family Medicine) Telford Nab, RN as Oncology Nurse Navigator [name], MD as Medical Oncologist (Oncology)   Name of the patient: Brittney Fisher  [id number]  [birth year]   REASON FOR VISIT  follow-up for lung cancer  PERTINENT ONCOLOGY HISTORY 60 y.o. female with past medical history including GERD, COPD, current everyday smoker presents for follow-up of lung mass and pleural effusion. 10/20/2020, patient was brought to ED via EMS due to generalized weakness, dizziness, chest pain shortness of breath.  She was recently diagnosed with COPD approximately 1 month ago by primary care provider.  Also unintentional weight loss during the last few months. Image work-up showed right-sided pleural effusion.  She underwent right thoracentesis.  With removal of 2.4 L of fluid. 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  Metastatic disease to low cervical/thoracic nodes, lung, right pleural space and bones.  Moderate to large right pleural effusion.  SVC narrowing. 10/21/2020 MRI brain is negative for metastasis.  Mild chronic microvascular ischemic changes in the white matter.  Negative for acute infarct Regarding to the SVC narrowing, patient was seen by vascular surgeon and was recommended no intervention inpatient.  Patient to follow-up outpatient with vascular surgeon for evaluation. Patient also was treated for COPD exacerbation with hypoxia on exertion. Qualifies for home oxygen and hospitalist arrange home health and a nebulizer.  Patient was given a course of prednisone taper and empiric Levaquin. Patient was discharged and present today to follow-up with cytology results and further management plan  10/27/2020 PET scan showed right  upper lobe primary bronchogenic mass with direct invasion into mediastinum. Metastatic disease to the right pleural space, lung, bone, nodes of the chest and less so lower leg/upper abdomen. Hypermetabolic him along the course of the right axillary vein with concurrent subtle hyper attenuation within the axillary vein and SVC.  This continues to the level of SVC narrowing.  Suspicious for SVC occlusion and developing thrombus. Moderate right pleural effusion and small pericardial effusion.  # # left supraclavicular mass biopsy- pathology is positive for adenocarcinoma. positive for CK7, with patchy, weak CK20. They are  negative for TTF1, NapsinA, GATA3, CDX2, and Pax8. The findings are  nonspecific; but may be compatible with a poorly differentiated  adenocarcinoma of lung origin, especially given the imaging findings  She also had another repeat thoracentesis and fluid cytology showed malignancy.  #NGS showed AXIN 1 [id number], DIS2 M118f, KRAS G12C, PRDM1 M1 [id number], [id number] MS stable, TPS <1%    11/14/2020- 12/12/2020. palliative chest RT    #After 2 cycles of chemotherapy, patient had CT scan done due to shortness of breath.  01/05/2021, CT chest angiogram PE protocol showed no evidence of pulmonary embolism.  Stable disease.  Right upper lobe mass about 6.7 x 5.3 cm.  Widespread metastatic disease, lung and bone involvement. Interval development of right-sided hydronephrosis.   INTERVAL HISTORY Brittney BRECHTis a 60y.o. female who has above history reviewed by me today presents for follow up visit for management of metastatic lung cancer, acute visit for swallowing pain.  Problems and complaints are listed below: Increased back pain for the past 2 weeks.  Patient takes oxycodone every 3-4 hours hsymptom relief.  Hepatitis panel.  Weight is stable.     Review of Systems  Constitutional: Negative for chills,  fatigue, fever and unexpected weight change.  HENT:   Negative  for hearing loss and voice change.   Eyes: Negative for eye problems.  Respiratory: Positive for cough. Negative for chest tightness and shortness of breath.   Cardiovascular: Negative for chest pain.  Gastrointestinal: Negative for abdominal distention, abdominal pain and blood in stool.  Endocrine: Negative for hot flashes.  Genitourinary: Negative for difficulty urinating and frequency.   Musculoskeletal: Positive for back pain. Negative for arthralgias.  Skin: Negative for itching and rash.  Neurological: Negative for extremity weakness.  Hematological: Negative for adenopathy.  Psychiatric/Behavioral: Negative for confusion.      No Known Allergies   Past Medical History:  Diagnosis Date  . COPD (chronic obstructive pulmonary disease) ([place])   . GERD (gastroesophageal reflux disease)   . Metastatic lung cancer (metastasis from lung to other site) [facility]) 11/02/2020     Past Surgical History:  Procedure Laterality Date  . COLONOSCOPY WITH ESOPHAGOGASTRODUODENOSCOPY (EGD)    . COLONOSCOPY WITH PROPOFOL N/A 11/14/2015   Procedure: COLONOSCOPY WITH PROPOFOL;  Surgeon: Hulen Luster, MD;  Location: [facility] ENDOSCOPY;  Service: Gastroenterology;  Laterality: N/A;  . ESOPHAGOGASTRODUODENOSCOPY (EGD) WITH PROPOFOL N/A 11/14/2015   Procedure: ESOPHAGOGASTRODUODENOSCOPY (EGD) WITH PROPOFOL;  Surgeon: Hulen Luster, MD;  Location: [facility] ENDOSCOPY;  Service: Gastroenterology;  Laterality: N/A;    Social History   Socioeconomic History  . Marital status: Married    Spouse name: Not on file  . Number of children: Not on file  . Years of education: Not on file  . Highest education level: Not on file  Occupational History  . Not on file  Tobacco Use  . Smoking status: Current Every Day Smoker    Packs/day: 0.20    Types: Cigarettes  . Smokeless tobacco: Never Used  Substance and Sexual Activity  . Alcohol use: Never  . Drug use: Never  . Sexual activity: Not Currently  Other Topics  Concern  . Not on file  Social History Narrative  . Not on file   Social Determinants of Health   Financial Resource Strain: Not on file  Food Insecurity: Not on file  Transportation Needs: Not on file  Physical Activity: Not on file  Stress: Not on file  Social Connections: Not on file  Intimate Partner Violence: Not on file    Family History  Problem Relation Age of Onset  . Breast cancer Cousin 2  . Diabetes type II Mother   . Hypertension Mother   . Colon cancer Father   . Hypertension Father      Current Outpatient Medications:  .  acetaminophen (TYLENOL) 500 MG tablet, Take 1,000 mg by mouth every 8 (eight) hours as needed for moderate pain. , Disp: , Rfl:  .  benzonatate (TESSALON) 100 MG capsule, Take 1 capsule (100 mg total) by mouth 3 (three) times daily as needed for cough., Disp: 60 capsule, Rfl: 1 .  dexamethasone (DECADRON) 4 MG tablet, Take 1 tab (4 mg) twice a day the day before Alimta chemo. Take 2 tablets (8 mg) po daily x 3 days starting the day after chemo., Disp: 30 tablet, Rfl: 1 .  feeding supplement (ENSURE ENLIVE / ENSURE PLUS) LIQD, Take 237 mLs by mouth 2 (two) times daily between meals., Disp: 237 mL, Rfl: 12 .  folic acid (FOLVITE) 1 MG tablet, Take 1 tablet (1 mg total) by mouth daily. Start 5-7 days before Alimta chemotherapy. Continue until 21 days after Alimta completed., Disp: 100 tablet,  Rfl: 3 .  guaiFENesin (MUCINEX) 600 MG 12 hr tablet, Take 1 tablet (600 mg total) by mouth 2 (two) times daily., Disp: 14 tablet, Rfl: 0 .  HYDROcodone-homatropine (HYCODAN) 5-1.5 MG/5ML syrup, Take 5 mLs by mouth every 6 (six) hours as needed for cough., Disp: 120 mL, Rfl: 0 .  ipratropium-albuterol (DUONEB) 0.5-2.5 (3) MG/3ML SOLN, Take 3 mLs by nebulization 3 (three) times daily., Disp: 360 mL, Rfl: 1 .  magic mouthwash w/lidocaine SOLN, Take 5 mLs by mouth 4 (four) times daily as needed for mouth pain. Sig: Swish/Swallow 5-10 ml four times a day as needed.  Dispense 480 ml. 1RF, Disp: 480 mL, Rfl: 1 .  omeprazole (PRILOSEC) 20 MG capsule, Take 1 capsule (20 mg total) by mouth 2 (two) times daily before a meal., Disp: 60 capsule, Rfl: 1 .  ondansetron (ZOFRAN) 8 MG tablet, Take 1 tablet (8 mg total) by mouth 2 (two) times daily as needed for refractory nausea / vomiting. Start on day 3 after chemo., Disp: 30 tablet, Rfl: 1 .  oxyCODONE (OXY IR/ROXICODONE) 5 MG immediate release tablet, Take 1 tablet (5 mg total) by mouth every 4 (four) hours as needed for severe pain., Disp: 60 tablet, Rfl: 0 .  Polyethylene Glycol 3350 (MIRALAX PO), Take by mouth daily as needed., Disp: , Rfl:  .  prochlorperazine (COMPAZINE) 10 MG tablet, Take 1 tablet (10 mg total) by mouth every 6 (six) hours as needed (Nausea or vomiting)., Disp: 30 tablet, Rfl: 1 .  senna-docusate (SENOKOT-S) 8.6-50 MG tablet, Take 2 tablets by mouth daily., Disp: 60 tablet, Rfl: 3 .  sucralfate (CARAFATE) 1 GM/10ML suspension, Take 10 mLs (1 g total) by mouth in the morning, at noon, and at bedtime., Disp: 420 mL, Rfl: 0 .  tiotropium (SPIRIVA HANDIHALER) 18 MCG inhalation capsule, Place 18 mcg into inhaler and inhale daily., Disp: , Rfl:  .  amoxicillin-clavulanate (AUGMENTIN) 875-125 MG tablet, Take 1 tablet by mouth 2 (two) times daily. (Patient not taking: No sig reported), Disp: 28 tablet, Rfl: 0 .  azithromycin (ZITHROMAX) 250 MG tablet, 2 tabs on day. 1 tab days 2-5. (Patient not taking: No sig reported), Disp: 6 each, Rfl: 0 .  fentaNYL (DURAGESIC) 25 MCG/HR, Place 1 patch onto the skin every 3 (three) days. (Patient not taking: No sig reported), Disp: 2 patch, Rfl: 0 .  guaiFENesin-codeine (CHERATUSSIN AC) 100-10 MG/5ML syrup, Take 5 mLs by mouth 3 (three) times daily as needed for cough. (Patient not taking: No sig reported), Disp: 120 mL, Rfl: 0  Physical exam: ECOG 2 Vitals:   02/10/21 1046  BP: 101/71  Pulse: (!) 106  Resp: 18  Temp: 98.3 F (36.8 C)  SpO2: 100%  Weight: 132 lb  8 oz (60.1 kg)   Physical Exam Constitutional:      General: She is not in acute distress.    Comments: Thin built female walks independently  HENT:     Head: Normocephalic and atraumatic.  Eyes:     General: No scleral icterus. Cardiovascular:     Rate and Rhythm: Normal rate and regular rhythm.     Heart sounds: Normal heart sounds.  Pulmonary:     Effort: Pulmonary effort is normal. No respiratory distress.     Breath sounds: No wheezing.     Comments: Decreased breath sounds in right lower lung Abdominal:     General: Bowel sounds are normal. There is no distension.     Palpations: Abdomen is soft.  Musculoskeletal:  General: No deformity. Normal range of motion.     Cervical back: Normal range of motion and neck supple.  Skin:    General: Skin is warm and dry.     Findings: No erythema or rash.  Neurological:     Mental Status: She is alert and oriented to person, place, and time. Mental status is at baseline.     Cranial Nerves: No cranial nerve deficit.     Coordination: Coordination normal.  Psychiatric:        Mood and Affect: Mood normal.     CMP Latest Ref Rng & Units 02/10/2021  Glucose 70 - 99 mg/dL 94  BUN 8 - 23 mg/dL 24(H)  Creatinine 0.44 - 1.00 mg/dL 0.68  Sodium 135 - 145 mmol/L 136  Potassium 3.5 - 5.1 mmol/L 3.8  Chloride 98 - 111 mmol/L 101  CO2 22 - 32 mmol/L 25  Calcium 8.9 - 10.3 mg/dL 8.5(L)  Total Protein 6.5 - 8.1 g/dL 7.1  Total Bilirubin 0.3 - 1.2 mg/dL 0.4  Alkaline Phos 38 - 126 U/L 83  AST 15 - 41 U/L 39  ALT 0 - 44 U/L 44   CBC Latest Ref Rng & Units 02/10/2021  WBC 4.0 - 10.5 K/uL 3.7(L)  Hemoglobin 12.0 - 15.0 g/dL 11.2(L)  Hematocrit 36.0 - 46.0 % 34.5(L)  Platelets 150 - 400 K/uL 184    RADIOGRAPHIC STUDIES: I have personally reviewed the radiological images as listed and agreed with the findings in the report. CT Abdomen Pelvis W Contrast  Result Date: 01/20/2021 CLINICAL DATA:  Metastatic lung cancer,  hydronephrosis EXAM: CT ABDOMEN AND PELVIS WITH CONTRAST TECHNIQUE: Multidetector CT imaging of the abdomen and pelvis was performed using the standard protocol following bolus administration of intravenous contrast. CONTRAST:  33m OMNIPAQUE IOHEXOL 300 MG/ML  SOLN COMPARISON:  CT chest angiogram, 01/05/2021, PET-CT, 10/27/2020 FINDINGS: Lower chest: No acute abnormality. Redemonstrated pleural and pulmonary metastatic disease throughout the included lung bases, worse on the right, better detailed by prior CT of the chest. Hepatobiliary: No solid liver abnormality is seen. No gallstones, gallbladder wall thickening, or biliary dilatation. Pancreas: Unremarkable. No pancreatic ductal dilatation or surrounding inflammatory changes. Spleen: Normal in size without significant abnormality. Adrenals/Urinary Tract: Adrenal glands are unremarkable. Delayed phase imaging reveals a parapelvic cyst of the right kidney without evidence of hydronephrosis. Bladder is unremarkable. Stomach/Bowel: Stomach is within normal limits. Appendix is not clearly vision. No evidence of bowel wall thickening, distention, or inflammatory changes. Moderate burden of stool throughout the colon and rectum. Vascular/Lymphatic: Aortic atherosclerosis. No enlarged abdominal or pelvic lymph nodes. Reproductive: No mass or other significant abnormality. Other: No abdominal wall hernia or abnormality. No abdominopelvic ascites. Musculoskeletal: Scattered osseous metastases throughout. IMPRESSION: 1. Delayed phase imaging of the abdomen and pelvis reveals a parapelvic cyst of the right kidney, which does not represent hydronephrosis suspected by prior CT of the chest. 2. Redemonstrated pleural and pulmonary metastatic disease throughout the included lung bases, worse on the right, better detailed by prior CT of the chest. 3. Osseous metastatic disease throughout the included skeleton, not evidently changed compared to prior PET-CT. 4. No evidence of  soft tissue metastatic disease or lymphadenopathy in the abdomen or pelvis. Aortic Atherosclerosis (ICD10-I70.0). Electronically Signed   By: AEddie CandleM.D.   On: 01/20/2021 10:23     Assessment and plan  1. [place] malignant ne[place] of lung metastatic to other [place]   2. Neoplasm related pain   3. Anemia,  unspecified type   4. 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  cT4 N3 M1-  KRASG12C (Lumakras in subsequent line can be considered) First-line treatment with carboplatin/Alimta/bevacizumab. Labs reviewed and discussed with patient. Counts stable.   Cough, intermittent shortness of breath, no PE on CT angiogram. Symptoms are chronic and stable.  #Bone metastasis.  Patient has increased back pain. Arrange patient to see Dr. Baruch Gouty for possible palliative radiation. Discussed about rationale and side effects of bisphosphonate treatments.  Discussed about dental clearance.  She declined.  #Neoplasm related pain/treatment complication-pain appears to be stable. Continue oxycodone 5 mg every 4 hours as needed.  Patient declined long-acting narcotics.  #Bronchial obstruction and SVC occlusion.  s/p palliative radiation #Anemia, likely secondary to chemotherapy radiation and underlying malignancy.  Is 11.2 today.  #Right pleural effusion, stable. #Decreased appetite, continue dexamethasone 4 mg twice daily for 4 days after this round of chemo.   Lab MD Norma Fredrickson, Alimta, bevacizumab, 02/20/2021  [name], MD, PhD Hematology Oncology [facility] Pager- [phone] 02/10/2021

## 2021-02-13 ENCOUNTER — Ambulatory Visit: Payer: BC Managed Care – PPO | Admitting: Radiation Oncology

## 2021-02-14 ENCOUNTER — Encounter: Payer: Self-pay | Admitting: Radiation Oncology

## 2021-02-14 ENCOUNTER — Ambulatory Visit
Admission: RE | Admit: 2021-02-14 | Discharge: 2021-02-14 | Disposition: A | Discharge status: Home / Self Care | Payer: BC Managed Care – PPO | Source: Ambulatory Visit | Attending: Radiation Oncology | Admitting: Radiation Oncology

## 2021-02-14 VITALS — BP 118/67 | HR 114 | Temp 98.1°F | Wt 133.5 lb

## 2021-02-14 DIAGNOSIS — C3490 Malignant neoplasm of unspecified part of unspecified bronchus or lung: Secondary | ICD-10-CM

## 2021-02-14 DIAGNOSIS — C7951 Secondary malignant neoplasm of bone: Secondary | ICD-10-CM

## 2021-02-14 NOTE — Progress Notes (Signed)
Radiation Oncology Follow up Note old patient new area bone metastasis  Name: Brittney Fisher   Date:   02/14/2021 MRN:  [id number] DOB: 1961-06-13    This 60 y.o. female presents to the clinic today for evaluation of T2 vertebral body metastasis and patient with known stage IV.  Lung adenocarcinoma  REFERRING PROVIDER: Ranae Plumber, PA  HPI: Patient is a 60 year old female well-known to our department having received palliative radiation therapy to her chest to prevent superior vena cava syndrome in a patient with known stage IV adenocarcinoma of the lung.  Present time from that standpoint she is doing well.  She has undergoneFirst-line treatment with carboplatin/Alimta/bevacizumab.  She is breathing well does have a mild stable cough no hemoptysis.  She has been having some upper back pain she does have by PET/CT and CT criteria a lytic lesion at the T2 vertebral body encroaching towards the spinal canal.  She is having no focal neurologic deficits.  I been asked to evaluate her for possible palliative to that region.  COMPLICATIONS OF TREATMENT: none  FOLLOW UP COMPLIANCE: keeps appointments   PHYSICAL EXAM:  BP 118/67 (Cuff Size: Normal)   Pulse (!) 114   Temp 98.1 F (36.7 C) (Tympanic)   Wt 133 lb 8 oz (60.6 kg)   BMI 18.62 kg/m  Motor or sensory and DTR levels are equal and symmetric in upper and lower extremities.  No pain is elicited on deep palpation of her spine.  Well-developed well-nourished patient in NAD. HEENT reveals PERLA, EOMI, discs not visualized.  Oral cavity is clear. No oral mucosal lesions are identified. Neck is clear without evidence of cervical or supraclavicular adenopathy. Lungs are clear to A&P. Cardiac examination is essentially unremarkable with regular rate and rhythm without murmur rub or thrill. Abdomen is benign with no organomegaly or masses noted. Motor sensory and DTR levels are equal and symmetric in the upper and lower extremities. Cranial nerves  II through XII are grossly intact. Proprioception is intact. No peripheral adenopathy or edema is identified. No motor or sensory levels are noted. Crude visual fields are within normal range.  RADIOLOGY RESULTS: PET/CT and CT scans reviewed compatible with above-stated findings  PLAN: This time elect go ahead with palliative radiation therapy to her T2 vertebral body.  Would plan on delivering 30 Gray in 10 fractions.  We may be overlapping previous treatment fields will try to avoid overlap of the spinal cord is much as possible.  I have personally set up and ordered CT simulation for later this week and will start her treatments as soon as possible.  Side effects of treatment occluding possible radiation esophagitis fatigue skin reaction alteration blood counts all were discussed in detail with the patient.  She seems to comprehend my recommendations well.  I would like to take this opportunity to thank you for allowing me to participate in the care of your patient.Noreene Filbert, MD

## 2021-02-15 ENCOUNTER — Ambulatory Visit
Admission: RE | Admit: 2021-02-15 | Discharge: 2021-02-15 | Disposition: A | Discharge status: Home / Self Care | Payer: BC Managed Care – PPO | Source: Ambulatory Visit | Attending: Radiation Oncology | Admitting: Radiation Oncology

## 2021-02-15 DIAGNOSIS — Z510 Encounter for antineoplastic radiation therapy: Secondary | ICD-10-CM | POA: Diagnosis not present

## 2021-02-15 DIAGNOSIS — C3490 Malignant neoplasm of unspecified part of unspecified bronchus or lung: Secondary | ICD-10-CM | POA: Diagnosis present

## 2021-02-16 ENCOUNTER — Inpatient Hospital Stay: Payer: BC Managed Care – PPO | Attending: Oncology

## 2021-02-16 DIAGNOSIS — I871 Compression of vein: Secondary | ICD-10-CM | POA: Insufficient documentation

## 2021-02-16 DIAGNOSIS — G35 Multiple sclerosis: Secondary | ICD-10-CM | POA: Insufficient documentation

## 2021-02-16 DIAGNOSIS — Z510 Encounter for antineoplastic radiation therapy: Secondary | ICD-10-CM | POA: Diagnosis not present

## 2021-02-16 DIAGNOSIS — Z5111 Encounter for antineoplastic chemotherapy: Secondary | ICD-10-CM | POA: Insufficient documentation

## 2021-02-16 DIAGNOSIS — Z800 Family history of malignant neoplasm of digestive organs: Secondary | ICD-10-CM | POA: Insufficient documentation

## 2021-02-16 DIAGNOSIS — G893 Neoplasm related pain (acute) (chronic): Secondary | ICD-10-CM | POA: Insufficient documentation

## 2021-02-16 DIAGNOSIS — C3490 Malignant neoplasm of unspecified part of unspecified bronchus or lung: Secondary | ICD-10-CM | POA: Insufficient documentation

## 2021-02-16 DIAGNOSIS — Z803 Family history of malignant neoplasm of breast: Secondary | ICD-10-CM | POA: Insufficient documentation

## 2021-02-16 DIAGNOSIS — C7951 Secondary malignant neoplasm of bone: Secondary | ICD-10-CM | POA: Insufficient documentation

## 2021-02-16 DIAGNOSIS — F17210 Nicotine dependence, cigarettes, uncomplicated: Secondary | ICD-10-CM | POA: Insufficient documentation

## 2021-02-16 DIAGNOSIS — R300 Dysuria: Secondary | ICD-10-CM | POA: Insufficient documentation

## 2021-02-16 DIAGNOSIS — J90 Pleural effusion, not elsewhere classified: Secondary | ICD-10-CM | POA: Insufficient documentation

## 2021-02-16 DIAGNOSIS — J449 Chronic obstructive pulmonary disease, unspecified: Secondary | ICD-10-CM | POA: Insufficient documentation

## 2021-02-16 DIAGNOSIS — I82210 Acute embolism and thrombosis of superior vena cava: Secondary | ICD-10-CM | POA: Insufficient documentation

## 2021-02-16 DIAGNOSIS — D630 Anemia in neoplastic disease: Secondary | ICD-10-CM | POA: Insufficient documentation

## 2021-02-16 DIAGNOSIS — Z5112 Encounter for antineoplastic immunotherapy: Secondary | ICD-10-CM | POA: Insufficient documentation

## 2021-02-16 DIAGNOSIS — K219 Gastro-esophageal reflux disease without esophagitis: Secondary | ICD-10-CM | POA: Insufficient documentation

## 2021-02-16 NOTE — Progress Notes (Signed)
Nutrition Follow-up:  Patient with metastatic lung cancer and malignant pleural effusion.  Planning to start radiation again.   Spoke with patient via phone for nutrition follow-up.  Patient reports that appetite is better. Yesterday was able to eat steak and pork chop with vegetables and potatoes.  Drinking premier protein shake about 2 times per day.  Reports steroids have increased appetite and energy.    Medications: reviewed  Labs: reviewed  Anthropometrics:   Weight 133 lb 8 oz on 3/1 increased from 128 lb  134 lb on 12/16   NUTRITION DIAGNOSIS: Inadequate oral intake improving   INTERVENTION:  Discussed ways to add calories and protein. Patient with questions regarding foods to eat if has trouble swallowing from radiation. Examples provided Continue premier protein shakes, does not like ensure.     MONITORING, EVALUATION, GOAL: weigh trends, intake   NEXT VISIT: April 7 phone call  Latrelle Bazar B. [name], [place], [place] Registered Dietitian [phone] (mobile)

## 2021-02-17 ENCOUNTER — Other Ambulatory Visit: Payer: Self-pay | Admitting: *Deleted

## 2021-02-17 MED ORDER — OXYCODONE HCL 5 MG PO TABS: 5.0000 mg | ORAL_TABLET | ORAL | 0 refills | Status: DC | PRN

## 2021-02-20 ENCOUNTER — Ambulatory Visit: Admission: RE | Admit: 2021-02-20 | Payer: BC Managed Care – PPO | Source: Ambulatory Visit

## 2021-02-20 DIAGNOSIS — Z510 Encounter for antineoplastic radiation therapy: Secondary | ICD-10-CM | POA: Diagnosis not present

## 2021-02-21 ENCOUNTER — Ambulatory Visit
Admission: RE | Admit: 2021-02-21 | Discharge: 2021-02-21 | Disposition: A | Discharge status: Home / Self Care | Payer: BC Managed Care – PPO | Source: Ambulatory Visit | Attending: Radiation Oncology | Admitting: Radiation Oncology

## 2021-02-21 DIAGNOSIS — Z510 Encounter for antineoplastic radiation therapy: Secondary | ICD-10-CM | POA: Diagnosis not present

## 2021-02-22 ENCOUNTER — Ambulatory Visit
Admission: RE | Admit: 2021-02-22 | Discharge: 2021-02-22 | Disposition: A | Discharge status: Home / Self Care | Payer: BC Managed Care – PPO | Source: Ambulatory Visit | Attending: Radiation Oncology | Admitting: Radiation Oncology

## 2021-02-22 ENCOUNTER — Inpatient Hospital Stay: Payer: BC Managed Care – PPO

## 2021-02-22 ENCOUNTER — Encounter: Payer: Self-pay | Admitting: Oncology

## 2021-02-22 ENCOUNTER — Inpatient Hospital Stay (HOSPITAL_BASED_OUTPATIENT_CLINIC_OR_DEPARTMENT_OTHER): Payer: BC Managed Care – PPO | Admitting: Oncology

## 2021-02-22 VITALS — BP 122/76 | HR 106 | Temp 99.1°F | Resp 18 | Wt 139.2 lb

## 2021-02-22 DIAGNOSIS — K219 Gastro-esophageal reflux disease without esophagitis: Secondary | ICD-10-CM | POA: Diagnosis not present

## 2021-02-22 DIAGNOSIS — C3490 Malignant neoplasm of unspecified part of unspecified bronchus or lung: Secondary | ICD-10-CM

## 2021-02-22 DIAGNOSIS — D649 Anemia, unspecified: Secondary | ICD-10-CM

## 2021-02-22 DIAGNOSIS — C7951 Secondary malignant neoplasm of bone: Secondary | ICD-10-CM | POA: Diagnosis not present

## 2021-02-22 DIAGNOSIS — I82210 Acute embolism and thrombosis of superior vena cava: Secondary | ICD-10-CM | POA: Diagnosis not present

## 2021-02-22 DIAGNOSIS — Z803 Family history of malignant neoplasm of breast: Secondary | ICD-10-CM | POA: Diagnosis not present

## 2021-02-22 DIAGNOSIS — Z800 Family history of malignant neoplasm of digestive organs: Secondary | ICD-10-CM | POA: Diagnosis not present

## 2021-02-22 DIAGNOSIS — I871 Compression of vein: Secondary | ICD-10-CM | POA: Diagnosis not present

## 2021-02-22 DIAGNOSIS — Z5111 Encounter for antineoplastic chemotherapy: Secondary | ICD-10-CM | POA: Diagnosis not present

## 2021-02-22 DIAGNOSIS — G35 Multiple sclerosis: Secondary | ICD-10-CM | POA: Diagnosis not present

## 2021-02-22 DIAGNOSIS — J90 Pleural effusion, not elsewhere classified: Secondary | ICD-10-CM | POA: Diagnosis not present

## 2021-02-22 DIAGNOSIS — Z510 Encounter for antineoplastic radiation therapy: Secondary | ICD-10-CM | POA: Diagnosis not present

## 2021-02-22 DIAGNOSIS — R300 Dysuria: Secondary | ICD-10-CM | POA: Diagnosis not present

## 2021-02-22 DIAGNOSIS — F17210 Nicotine dependence, cigarettes, uncomplicated: Secondary | ICD-10-CM | POA: Diagnosis not present

## 2021-02-22 DIAGNOSIS — J449 Chronic obstructive pulmonary disease, unspecified: Secondary | ICD-10-CM | POA: Diagnosis not present

## 2021-02-22 DIAGNOSIS — D630 Anemia in neoplastic disease: Secondary | ICD-10-CM | POA: Diagnosis not present

## 2021-02-22 DIAGNOSIS — G893 Neoplasm related pain (acute) (chronic): Secondary | ICD-10-CM | POA: Diagnosis not present

## 2021-02-22 DIAGNOSIS — Z5112 Encounter for antineoplastic immunotherapy: Secondary | ICD-10-CM | POA: Diagnosis not present

## 2021-02-22 DIAGNOSIS — R350 Frequency of micturition: Secondary | ICD-10-CM

## 2021-02-22 LAB — CBC WITH DIFFERENTIAL/PLATELET
Abs Immature Granulocytes: 0.01 10*3/uL (ref 0.00–0.07)
Basophils Absolute: 0 10*3/uL (ref 0.0–0.1)
Basophils Relative: 0 %
Eosinophils Absolute: 0 10*3/uL (ref 0.0–0.5)
Eosinophils Relative: 1 %
HCT: 34 % — ABNORMAL LOW (ref 36.0–46.0)
Hemoglobin: 10.9 g/dL — ABNORMAL LOW (ref 12.0–15.0)
Immature Granulocytes: 0 %
Lymphocytes Relative: 25 %
Lymphs Abs: 0.8 10*3/uL (ref 0.7–4.0)
MCH: 27.3 pg (ref 26.0–34.0)
MCHC: 32.1 g/dL (ref 30.0–36.0)
MCV: 85.2 fL (ref 80.0–100.0)
Monocytes Absolute: 0.5 10*3/uL (ref 0.1–1.0)
Monocytes Relative: 16 %
Neutro Abs: 1.8 10*3/uL (ref 1.7–7.7)
Neutrophils Relative %: 58 %
Platelets: 198 10*3/uL (ref 150–400)
RBC: 3.99 MIL/uL (ref 3.87–5.11)
RDW: 19.3 % — ABNORMAL HIGH (ref 11.5–15.5)
WBC: 3.1 10*3/uL — ABNORMAL LOW (ref 4.0–10.5)
nRBC: 0 % (ref 0.0–0.2)

## 2021-02-22 LAB — COMPREHENSIVE METABOLIC PANEL
ALT: 19 U/L (ref 0–44)
AST: 38 U/L (ref 15–41)
Albumin: 3.1 g/dL — ABNORMAL LOW (ref 3.5–5.0)
Alkaline Phosphatase: 71 U/L (ref 38–126)
Anion gap: 8 (ref 5–15)
BUN: 16 mg/dL (ref 8–23)
CO2: 27 mmol/L (ref 22–32)
Calcium: 8.8 mg/dL — ABNORMAL LOW (ref 8.9–10.3)
Chloride: 101 mmol/L (ref 98–111)
Creatinine, Ser: 0.64 mg/dL (ref 0.44–1.00)
GFR, Estimated: >60 mL/min (ref >60)
Glucose, Bld: 89 mg/dL (ref 70–99)
Potassium: 4.5 mmol/L (ref 3.5–5.1)
Sodium: 136 mmol/L (ref 135–145)
Total Bilirubin: 0.6 mg/dL (ref 0.3–1.2)
Total Protein: 7.7 g/dL (ref 6.5–8.1)

## 2021-02-22 LAB — URINALYSIS, COMPLETE (UACMP) WITH MICROSCOPIC
Bacteria, UA: NONE SEEN
Bilirubin Urine: NEGATIVE
Glucose, UA: NEGATIVE mg/dL
Hgb urine dipstick: NEGATIVE
Ketones, ur: NEGATIVE mg/dL
Leukocytes,Ua: NEGATIVE
Nitrite: NEGATIVE
Protein, ur: NEGATIVE mg/dL
Specific Gravity, Urine: 1.016 (ref 1.005–1.030)
pH: 6 (ref 5.0–8.0)

## 2021-02-22 LAB — PROTEIN, URINE, RANDOM: Total Protein, Urine: 7 mg/dL

## 2021-02-22 NOTE — Progress Notes (Signed)
Hematology/Oncology Follow Up Note [facility]  Telephone:([phone] [phone]  Patient Care Team: Ranae Plumber, [place] as PCP - General (Family Medicine) Telford Nab, RN as Oncology Nurse Navigator [name], MD as Medical Oncologist (Oncology)   Name of the patient: Brittney Fisher  [id number]  14-Jul-1961   REASON FOR VISIT  follow-up for lung cancer  PERTINENT ONCOLOGY HISTORY 60 y.o. female with past medical history including GERD, COPD, current everyday smoker presents for follow-up of lung mass and pleural effusion. 10/20/2020, patient was brought to ED via EMS due to generalized weakness, dizziness, chest pain shortness of breath.  She was recently diagnosed with COPD approximately 1 month ago by primary care provider.  Also unintentional weight loss during the last few months. Image work-up showed right-sided pleural effusion.  She underwent right thoracentesis.  With removal of 2.4 L of fluid. 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  Metastatic disease to low cervical/thoracic nodes, lung, right pleural space and bones.  Moderate to large right pleural effusion.  SVC narrowing. 10/21/2020 MRI brain is negative for metastasis.  Mild chronic microvascular ischemic changes in the white matter.  Negative for acute infarct Regarding to the SVC narrowing, patient was seen by vascular surgeon and was recommended no intervention inpatient.  Patient to follow-up outpatient with vascular surgeon for evaluation. Patient also was treated for COPD exacerbation with hypoxia on exertion. Qualifies for home oxygen and hospitalist arrange home health and a nebulizer.  Patient was given a course of prednisone taper and empiric Levaquin. Patient was discharged and present today to follow-up with cytology results and further management plan  10/27/2020 PET scan showed right  upper lobe primary bronchogenic mass with direct invasion into mediastinum. Metastatic disease to the right pleural space, lung, bone, nodes of the chest and less so lower leg/upper abdomen. Hypermetabolic him along the course of the right axillary vein with concurrent subtle hyper attenuation within the axillary vein and SVC.  This continues to the level of SVC narrowing.  Suspicious for SVC occlusion and developing thrombus. Moderate right pleural effusion and small pericardial effusion.  # # left supraclavicular mass biopsy- pathology is positive for adenocarcinoma. positive for CK7, with patchy, weak CK20. They are  negative for TTF1, NapsinA, GATA3, CDX2, and Pax8. The findings are  nonspecific; but may be compatible with a poorly differentiated  adenocarcinoma of lung origin, especially given the imaging findings  She also had another repeat thoracentesis and fluid cytology showed malignancy.  #NGS showed AXIN 1 [id number], DIS2 M163f, KRAS G12C, PRDM1 M1 [id number], [id number] MS stable, TPS <1%    11/14/2020- 12/12/2020. palliative chest RT    #After 2 cycles of chemotherapy, patient had CT scan done due to shortness of breath.  01/05/2021, CT chest angiogram PE protocol showed no evidence of pulmonary embolism.  Stable disease.  Right upper lobe mass about 6.7 x 5.3 cm.  Widespread metastatic disease, lung and bone involvement. Interval development of right-sided hydronephrosis.   INTERVAL HISTORY VCERENITI CURBis a 60y.o. female who has above history reviewed by me today presents for follow up visit for management of metastatic lung cancer, acute visit for swallowing pain.  Problems and complaints are listed below: Patient reports having increased urinary frequency and dysuria since last treatment. Also started to have a headache this morning.  Not feeling well today.  Denies any fever, chills. Cough is chronic.  No significant more  shortness of breath than her  baseline.   Review of Systems  Constitutional: Negative for chills, fatigue, fever and unexpected weight change.  HENT:   Negative for hearing loss and voice change.   Eyes: Negative for eye problems.  Respiratory: Positive for cough. Negative for chest tightness and shortness of breath.   Cardiovascular: Negative for chest pain.  Gastrointestinal: Negative for abdominal distention, abdominal pain and blood in stool.  Endocrine: Negative for hot flashes.  Genitourinary: Negative for difficulty urinating and frequency.   Musculoskeletal: Positive for back pain. Negative for arthralgias.  Skin: Negative for itching and rash.  Neurological: Negative for extremity weakness.  Hematological: Negative for adenopathy.  Psychiatric/Behavioral: Negative for confusion.      No Known Allergies   Past Medical History:  Diagnosis Date  . COPD (chronic obstructive pulmonary disease) ([place])   . GERD (gastroesophageal reflux disease)   . Metastatic lung cancer (metastasis from lung to other site) [facility]) 11/02/2020     Past Surgical History:  Procedure Laterality Date  . COLONOSCOPY WITH ESOPHAGOGASTRODUODENOSCOPY (EGD)    . COLONOSCOPY WITH PROPOFOL N/A 11/14/2015   Procedure: COLONOSCOPY WITH PROPOFOL;  Surgeon: Hulen Luster, MD;  Location: [facility] ENDOSCOPY;  Service: Gastroenterology;  Laterality: N/A;  . ESOPHAGOGASTRODUODENOSCOPY (EGD) WITH PROPOFOL N/A 11/14/2015   Procedure: ESOPHAGOGASTRODUODENOSCOPY (EGD) WITH PROPOFOL;  Surgeon: Hulen Luster, MD;  Location: [facility] ENDOSCOPY;  Service: Gastroenterology;  Laterality: N/A;    Social History   Socioeconomic History  . Marital status: Married    Spouse name: Not on file  . Number of children: Not on file  . Years of education: Not on file  . Highest education level: Not on file  Occupational History  . Not on file  Tobacco Use  . Smoking status: Current Every Day Smoker    Packs/day: 0.20    Types: Cigarettes  . Smokeless tobacco: Never  Used  Substance and Sexual Activity  . Alcohol use: Never  . Drug use: Never  . Sexual activity: Not Currently  Other Topics Concern  . Not on file  Social History Narrative  . Not on file   Social Determinants of Health   Financial Resource Strain: Not on file  Food Insecurity: Not on file  Transportation Needs: Not on file  Physical Activity: Not on file  Stress: Not on file  Social Connections: Not on file  Intimate Partner Violence: Not on file    Family History  Problem Relation Age of Onset  . Breast cancer Cousin 28  . Diabetes type II Mother   . Hypertension Mother   . Colon cancer Father   . Hypertension Father      Current Outpatient Medications:  .  acetaminophen (TYLENOL) 500 MG tablet, Take 1,000 mg by mouth every 8 (eight) hours as needed for moderate pain. , Disp: , Rfl:  .  benzonatate (TESSALON) 100 MG capsule, Take 1 capsule (100 mg total) by mouth 3 (three) times daily as needed for cough., Disp: 60 capsule, Rfl: 1 .  dexamethasone (DECADRON) 4 MG tablet, Take 1 tab (4 mg) twice a day the day before Alimta chemo. Take 2 tablets (8 mg) po daily x 3 days starting the day after chemo., Disp: 30 tablet, Rfl: 1 .  feeding supplement (ENSURE ENLIVE / ENSURE PLUS) LIQD, Take 237 mLs by mouth 2 (two) times daily between meals., Disp: 237 mL, Rfl: 12 .  folic acid (FOLVITE) 1 MG tablet, Take 1 tablet (1 mg total) by mouth daily.  Start 5-7 days before Alimta chemotherapy. Continue until 21 days after Alimta completed., Disp: 100 tablet, Rfl: 3 .  guaiFENesin (MUCINEX) 600 MG 12 hr tablet, Take 1 tablet (600 mg total) by mouth 2 (two) times daily., Disp: 14 tablet, Rfl: 0 .  ipratropium-albuterol (DUONEB) 0.5-2.5 (3) MG/3ML SOLN, Take 3 mLs by nebulization 3 (three) times daily., Disp: 360 mL, Rfl: 1 .  magic mouthwash w/lidocaine SOLN, Take 5 mLs by mouth 4 (four) times daily as needed for mouth pain. Sig: Swish/Swallow 5-10 ml four times a day as needed. Dispense 480  ml. 1RF, Disp: 480 mL, Rfl: 1 .  omeprazole (PRILOSEC) 20 MG capsule, Take 1 capsule (20 mg total) by mouth 2 (two) times daily before a meal., Disp: 60 capsule, Rfl: 1 .  ondansetron (ZOFRAN) 8 MG tablet, Take 1 tablet (8 mg total) by mouth 2 (two) times daily as needed for refractory nausea / vomiting. Start on day 3 after chemo., Disp: 30 tablet, Rfl: 1 .  oxyCODONE (OXY IR/ROXICODONE) 5 MG immediate release tablet, Take 1 tablet (5 mg total) by mouth every 4 (four) hours as needed for severe pain., Disp: 60 tablet, Rfl: 0 .  Polyethylene Glycol 3350 (MIRALAX PO), Take by mouth daily as needed., Disp: , Rfl:  .  prochlorperazine (COMPAZINE) 10 MG tablet, Take 1 tablet (10 mg total) by mouth every 6 (six) hours as needed (Nausea or vomiting)., Disp: 30 tablet, Rfl: 1 .  senna-docusate (SENOKOT-S) 8.6-50 MG tablet, Take 2 tablets by mouth daily., Disp: 60 tablet, Rfl: 3 .  sucralfate (CARAFATE) 1 GM/10ML suspension, Take 10 mLs (1 g total) by mouth in the morning, at noon, and at bedtime., Disp: 420 mL, Rfl: 0 .  tiotropium (SPIRIVA HANDIHALER) 18 MCG inhalation capsule, Place 18 mcg into inhaler and inhale daily., Disp: , Rfl:  .  fentaNYL (DURAGESIC) 25 MCG/HR, Place 1 patch onto the skin every 3 (three) days. (Patient not taking: No sig reported), Disp: 2 patch, Rfl: 0 .  guaiFENesin-codeine (CHERATUSSIN AC) 100-10 MG/5ML syrup, Take 5 mLs by mouth 3 (three) times daily as needed for cough. (Patient not taking: No sig reported), Disp: 120 mL, Rfl: 0 .  HYDROcodone-homatropine (HYCODAN) 5-1.5 MG/5ML syrup, Take 5 mLs by mouth every 6 (six) hours as needed for cough. (Patient not taking: Reported on 02/22/2021), Disp: 120 mL, Rfl: 0  Physical exam: ECOG 2 Vitals:   02/22/21 1029  BP: 122/76  Pulse: (!) 106  Resp: 18  Temp: 99.1 F (37.3 C)  SpO2: 100%  Weight: 139 lb 3.2 oz (63.1 kg)   Physical Exam Constitutional:      General: She is not in acute distress.    Comments: Thin built female  walks independently  HENT:     Head: Normocephalic and atraumatic.  Eyes:     General: No scleral icterus. Cardiovascular:     Rate and Rhythm: Normal rate and regular rhythm.     Heart sounds: Normal heart sounds.  Pulmonary:     Effort: Pulmonary effort is normal. No respiratory distress.     Breath sounds: No wheezing.     Comments: Decreased breath sounds in right lower lung Abdominal:     General: Bowel sounds are normal. There is no distension.     Palpations: Abdomen is soft.  Musculoskeletal:        General: No deformity. Normal range of motion.     Cervical back: Normal range of motion and neck supple.  Skin:  General: Skin is warm and dry.     Findings: No erythema or rash.  Neurological:     Mental Status: She is alert and oriented to person, place, and time. Mental status is at baseline.     Cranial Nerves: No cranial nerve deficit.     Coordination: Coordination normal.  Psychiatric:     Comments: tearful     CMP Latest Ref Rng & Units 02/22/2021  Glucose 70 - 99 mg/dL 89  BUN 8 - 23 mg/dL 16  Creatinine 0.44 - 1.00 mg/dL 0.64  Sodium 135 - 145 mmol/L 136  Potassium 3.5 - 5.1 mmol/L 4.5  Chloride 98 - 111 mmol/L 101  CO2 22 - 32 mmol/L 27  Calcium 8.9 - 10.3 mg/dL 8.8(L)  Total Protein 6.5 - 8.1 g/dL 7.7  Total Bilirubin 0.3 - 1.2 mg/dL 0.6  Alkaline Phos 38 - 126 U/L 71  AST 15 - 41 U/L 38  ALT 0 - 44 U/L 19   CBC Latest Ref Rng & Units 02/22/2021  WBC 4.0 - 10.5 K/uL 3.1(L)  Hemoglobin 12.0 - 15.0 g/dL 10.9(L)  Hematocrit 36.0 - 46.0 % 34.0(L)  Platelets 150 - 400 K/uL 198    RADIOGRAPHIC STUDIES: I have personally reviewed the radiological images as listed and agreed with the findings in the report. No results found.   Assessment and plan  1. Primary malignant neoplasm of lung metastatic to other site, unspecified laterality ([place])   2. Encounter for antineoplastic chemotherapy   3. SVC obstruction   4. Bone metastasis ([place])   5. Anemia,  unspecified type   6. 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  cT4 N3 M1-  KRASG12C (Lumakras in subsequent line can be considered) First-line treatment with carboplatin/Alimta/bevacizumab. Labs reviewed and discussed with patient. Hold chemotherapy treatment due to fatigue and dysuria.  Plan to repeat CT scan after next cycle of treatment.  #Dysuria/increased urinary frequency.  Check UA, urine culture.  Hold chemotherapy treatment today. #Cough, intermittent shortness of breath, no PE on CT angiogram. Symptoms are chronic and stable.  #Bone metastasis.  Patient has increased back pain. Dr. Baruch Gouty decides to give 10 fractions of palliative radiation. Discussed about rationale and side effects of bisphosphonate treatments.  Discussed about dental clearance.  She declined.  #Neoplasm related pain/treatment complication-pain appears to be stable. Continue oxycodone 5 mg every 4 hours as needed.  Patient declined long-acting narcotics. #Headache since this morning.  She may take Tylenol every 6 hours as needed.  #Bronchial obstruction and SVC occlusion.  s/p palliative radiation #Anemia, likely secondary to chemotherapy radiation and underlying malignancy.  10.9 today.  Monitor.  #Right pleural effusion, stable.   Lab MD Norma Fredrickson, Alimta, bevacizumab, 03/01/2021  [name], MD, PhD Hematology Oncology [facility] at [facility] Pager- [phone] 02/22/2021

## 2021-02-22 NOTE — Progress Notes (Signed)
Patient here for follow up. Pt reports she has been having urinary frequency at night since last chemo. Patient reports headache pain 10/10.

## 2021-02-23 ENCOUNTER — Ambulatory Visit
Admission: RE | Admit: 2021-02-23 | Discharge: 2021-02-23 | Disposition: A | Discharge status: Home / Self Care | Payer: BC Managed Care – PPO | Source: Ambulatory Visit | Attending: Radiation Oncology | Admitting: Radiation Oncology

## 2021-02-23 DIAGNOSIS — Z510 Encounter for antineoplastic radiation therapy: Secondary | ICD-10-CM | POA: Diagnosis not present

## 2021-02-23 LAB — URINE CULTURE: Culture: NO GROWTH

## 2021-02-24 ENCOUNTER — Ambulatory Visit: Payer: BC Managed Care – PPO | Admitting: Oncology

## 2021-02-24 ENCOUNTER — Inpatient Hospital Stay: Payer: BC Managed Care – PPO

## 2021-02-24 ENCOUNTER — Other Ambulatory Visit: Payer: BC Managed Care – PPO

## 2021-02-24 ENCOUNTER — Ambulatory Visit
Admission: RE | Admit: 2021-02-24 | Discharge: 2021-02-24 | Disposition: A | Discharge status: Home / Self Care | Payer: BC Managed Care – PPO | Source: Ambulatory Visit | Attending: Radiation Oncology | Admitting: Radiation Oncology

## 2021-02-24 DIAGNOSIS — Z510 Encounter for antineoplastic radiation therapy: Secondary | ICD-10-CM | POA: Diagnosis not present

## 2021-02-27 ENCOUNTER — Ambulatory Visit
Admission: RE | Admit: 2021-02-27 | Discharge: 2021-02-27 | Disposition: A | Discharge status: Home / Self Care | Payer: BC Managed Care – PPO | Source: Ambulatory Visit | Attending: Radiation Oncology | Admitting: Radiation Oncology

## 2021-02-27 DIAGNOSIS — Z510 Encounter for antineoplastic radiation therapy: Secondary | ICD-10-CM | POA: Diagnosis not present

## 2021-02-28 ENCOUNTER — Ambulatory Visit
Admission: RE | Admit: 2021-02-28 | Discharge: 2021-02-28 | Disposition: A | Discharge status: Home / Self Care | Payer: BC Managed Care – PPO | Source: Ambulatory Visit | Attending: Radiation Oncology | Admitting: Radiation Oncology

## 2021-02-28 DIAGNOSIS — Z510 Encounter for antineoplastic radiation therapy: Secondary | ICD-10-CM | POA: Diagnosis not present

## 2021-03-01 ENCOUNTER — Inpatient Hospital Stay: Payer: BC Managed Care – PPO

## 2021-03-01 ENCOUNTER — Encounter: Payer: Self-pay | Admitting: Oncology

## 2021-03-01 ENCOUNTER — Inpatient Hospital Stay (HOSPITAL_BASED_OUTPATIENT_CLINIC_OR_DEPARTMENT_OTHER): Payer: BC Managed Care – PPO | Admitting: Oncology

## 2021-03-01 ENCOUNTER — Ambulatory Visit
Admission: RE | Admit: 2021-03-01 | Discharge: 2021-03-01 | Disposition: A | Discharge status: Home / Self Care | Payer: BC Managed Care – PPO | Source: Ambulatory Visit | Attending: Radiation Oncology | Admitting: Radiation Oncology

## 2021-03-01 VITALS — BP 128/80 | HR 101 | Temp 98.5°F | Resp 16 | Wt 136.9 lb

## 2021-03-01 DIAGNOSIS — Z510 Encounter for antineoplastic radiation therapy: Secondary | ICD-10-CM | POA: Diagnosis not present

## 2021-03-01 DIAGNOSIS — Z5112 Encounter for antineoplastic immunotherapy: Secondary | ICD-10-CM | POA: Diagnosis not present

## 2021-03-01 DIAGNOSIS — C3490 Malignant neoplasm of unspecified part of unspecified bronchus or lung: Secondary | ICD-10-CM

## 2021-03-01 DIAGNOSIS — Z5111 Encounter for antineoplastic chemotherapy: Secondary | ICD-10-CM | POA: Diagnosis not present

## 2021-03-01 DIAGNOSIS — I871 Compression of vein: Secondary | ICD-10-CM

## 2021-03-01 DIAGNOSIS — C7951 Secondary malignant neoplasm of bone: Secondary | ICD-10-CM | POA: Diagnosis not present

## 2021-03-01 DIAGNOSIS — G893 Neoplasm related pain (acute) (chronic): Secondary | ICD-10-CM

## 2021-03-01 DIAGNOSIS — D649 Anemia, unspecified: Secondary | ICD-10-CM

## 2021-03-01 LAB — CBC WITH DIFFERENTIAL/PLATELET
Abs Immature Granulocytes: 0.02 10*3/uL (ref 0.00–0.07)
Basophils Absolute: 0 10*3/uL (ref 0.0–0.1)
Basophils Relative: 0 %
Eosinophils Absolute: 0 10*3/uL (ref 0.0–0.5)
Eosinophils Relative: 0 %
HCT: 33.8 % — ABNORMAL LOW (ref 36.0–46.0)
Hemoglobin: 11 g/dL — ABNORMAL LOW (ref 12.0–15.0)
Immature Granulocytes: 0 %
Lymphocytes Relative: 15 %
Lymphs Abs: 0.8 10*3/uL (ref 0.7–4.0)
MCH: 27.8 pg (ref 26.0–34.0)
MCHC: 32.5 g/dL (ref 30.0–36.0)
MCV: 85.6 fL (ref 80.0–100.0)
Monocytes Absolute: 0.7 10*3/uL (ref 0.1–1.0)
Monocytes Relative: 13 %
Neutro Abs: 3.6 10*3/uL (ref 1.7–7.7)
Neutrophils Relative %: 72 %
Platelets: 157 10*3/uL (ref 150–400)
RBC: 3.95 MIL/uL (ref 3.87–5.11)
RDW: 18.6 % — ABNORMAL HIGH (ref 11.5–15.5)
WBC: 5.1 10*3/uL (ref 4.0–10.5)
nRBC: 0 % (ref 0.0–0.2)

## 2021-03-01 LAB — COMPREHENSIVE METABOLIC PANEL
ALT: 18 U/L (ref 0–44)
AST: 25 U/L (ref 15–41)
Albumin: 3.2 g/dL — ABNORMAL LOW (ref 3.5–5.0)
Alkaline Phosphatase: 76 U/L (ref 38–126)
Anion gap: 10 (ref 5–15)
BUN: 25 mg/dL — ABNORMAL HIGH (ref 8–23)
CO2: 25 mmol/L (ref 22–32)
Calcium: 9 mg/dL (ref 8.9–10.3)
Chloride: 103 mmol/L (ref 98–111)
Creatinine, Ser: 0.73 mg/dL (ref 0.44–1.00)
GFR, Estimated: >60 mL/min (ref >60)
Glucose, Bld: 93 mg/dL (ref 70–99)
Potassium: 3.6 mmol/L (ref 3.5–5.1)
Sodium: 138 mmol/L (ref 135–145)
Total Bilirubin: 0.6 mg/dL (ref 0.3–1.2)
Total Protein: 7.4 g/dL (ref 6.5–8.1)

## 2021-03-01 MED ORDER — SODIUM CHLORIDE 0.9 % IV SOLN
Freq: Once | INTRAVENOUS | Status: AC
  Filled 2021-03-01: qty 250

## 2021-03-01 MED ORDER — SODIUM CHLORIDE 0.9 % IV SOLN
500.0000 mg/m2 | Freq: Once | INTRAVENOUS | Status: AC
  Administered 2021-03-01: 900 mg via INTRAVENOUS
  Filled 2021-03-01: qty 20

## 2021-03-01 MED ORDER — SODIUM CHLORIDE 0.9 % IV SOLN
503.5000 mg | Freq: Once | INTRAVENOUS | Status: AC
  Administered 2021-03-01: 500 mg via INTRAVENOUS
  Filled 2021-03-01: qty 50

## 2021-03-01 MED ORDER — PALONOSETRON HCL INJECTION 0.25 MG/5ML
0.2500 mg | Freq: Once | INTRAVENOUS | Status: AC
  Administered 2021-03-01: 0.25 mg via INTRAVENOUS
  Filled 2021-03-01: qty 5

## 2021-03-01 MED ORDER — SODIUM CHLORIDE 0.9% FLUSH
10.0000 mL | INTRAVENOUS | Status: DC | PRN
  Filled 2021-03-01: qty 10

## 2021-03-01 MED ORDER — SODIUM CHLORIDE 0.9 % IV SOLN
15.0000 mg/kg | Freq: Once | INTRAVENOUS | Status: AC
  Administered 2021-03-01: 900 mg via INTRAVENOUS
  Filled 2021-03-01: qty 32

## 2021-03-01 MED ORDER — DIPHENHYDRAMINE HCL 25 MG PO CAPS
ORAL_CAPSULE | ORAL | Status: AC
  Filled 2021-03-01: qty 1

## 2021-03-01 MED ORDER — SODIUM CHLORIDE 0.9 % IV SOLN
10.0000 mg | Freq: Once | INTRAVENOUS | Status: AC
  Administered 2021-03-01: 10 mg via INTRAVENOUS
  Filled 2021-03-01: qty 10

## 2021-03-01 MED ORDER — DIPHENHYDRAMINE HCL 25 MG PO CAPS
25.0000 mg | ORAL_CAPSULE | Freq: Once | ORAL | Status: AC
  Administered 2021-03-01: 25 mg via ORAL

## 2021-03-01 MED ORDER — SODIUM CHLORIDE 0.9 % IV SOLN
150.0000 mg | Freq: Once | INTRAVENOUS | Status: AC
  Administered 2021-03-01: 150 mg via INTRAVENOUS
  Filled 2021-03-01: qty 150

## 2021-03-01 NOTE — Progress Notes (Signed)
1257 - following  carboplatin and NS flush, patient had an episode of coughing (however, cough has been ongoing and she did not bring her medication and attributes to this) as well as new onset of itching of her head and back. No hives or redness observed. Does not endorse any other symptoms at this time. 1258 - VS 144/80, HR 115, O2 98% 1300- Faythe Casa NP notified [place] NP arrived to assess patient 1308- Benadryl 25mg  PO given per orders 1310 - VS 128/76, HR 199, O2 98%. Anderson Malta gave patient verbal and written instructions for home Benadryl and Pepcid. OK to discharge home.

## 2021-03-01 NOTE — Progress Notes (Signed)
Hematology/Oncology Follow Up Note [facility]  [phone] [phone]  Patient Care Team: Ranae Plumber, [place] as PCP - General (Family Medicine) Telford Nab, RN as Oncology Nurse Navigator [name], MD as Medical Oncologist (Oncology)   Name of the patient: Brittney Fisher  [id number]  04/03/1961   REASON FOR VISIT  follow-up for lung cancer  PERTINENT ONCOLOGY HISTORY 60 y.o. female with past medical history including GERD, COPD, current everyday smoker presents for follow-up of lung mass and pleural effusion. 10/20/2020, patient was brought to ED via EMS due to generalized weakness, dizziness, chest pain shortness of breath.  She was recently diagnosed with COPD approximately 1 month ago by primary care provider.  Also unintentional weight loss during the last few months. Image work-up showed right-sided pleural effusion.  She underwent right thoracentesis.  With removal of 2.4 L of fluid. 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  Metastatic disease to low cervical/thoracic nodes, lung, right pleural space and bones.  Moderate to large right pleural effusion.  SVC narrowing. 10/21/2020 MRI brain is negative for metastasis.  Mild chronic microvascular ischemic changes in the white matter.  Negative for acute infarct Regarding to the SVC narrowing, patient was seen by vascular surgeon and was recommended no intervention inpatient.  Patient to follow-up outpatient with vascular surgeon for evaluation. Patient also was treated for COPD exacerbation with hypoxia on exertion. Qualifies for home oxygen and hospitalist arrange home health and a nebulizer.  Patient was given a course of prednisone taper and empiric Levaquin. Patient was discharged and present today to follow-up with cytology results and further management plan  10/27/2020 PET scan showed right  upper lobe primary bronchogenic mass with direct invasion into mediastinum. Metastatic disease to the right pleural space, lung, bone, nodes of the chest and less so lower leg/upper abdomen. Hypermetabolic him along the course of the right axillary vein with concurrent subtle hyper attenuation within the axillary vein and SVC.  This continues to the level of SVC narrowing.  Suspicious for SVC occlusion and developing thrombus. Moderate right pleural effusion and small pericardial effusion.  # # left supraclavicular mass biopsy- pathology is positive for adenocarcinoma. positive for CK7, with patchy, weak CK20. They are  negative for TTF1, NapsinA, GATA3, CDX2, and Pax8. The findings are  nonspecific; but may be compatible with a poorly differentiated  adenocarcinoma of lung origin, especially given the imaging findings  She also had another repeat thoracentesis and fluid cytology showed malignancy.  #NGS showed AXIN 1 [id number], DIS2 M128f, KRAS G12C, PRDM1 M1 [id number], [id number] MS stable, TPS <1%    11/14/2020- 12/12/2020. palliative chest RT    #After 2 cycles of chemotherapy, patient had CT scan done due to shortness of breath.  01/05/2021, CT chest angiogram PE protocol showed no evidence of pulmonary embolism.  Stable disease.  Right upper lobe mass about 6.7 x 5.3 cm.  Widespread metastatic disease, lung and bone involvement. Interval development of right-sided hydronephrosis.   INTERVAL HISTORY Brittney POGOSYANis a 60y.o. female who has above history reviewed by me today presents for follow up visit for management of metastatic lung cancer, acute visit for swallowing pain.  Problems and complaints are listed below: Today patient's feels stronger.  Mood is better. Dysuria has resolved. Urine analysis and urine culture collected last week showed no UTI. She reports breathing is at baseline.  Not worse. Denies any nausea.  Vomiting,  fever or chills She denies any pain  today. Review of Systems  Constitutional: Negative for chills, fatigue, fever and unexpected weight change.  HENT:   Negative for hearing loss and voice change.   Eyes: Negative for eye problems.  Respiratory: Positive for cough. Negative for chest tightness and shortness of breath.   Cardiovascular: Negative for chest pain.  Gastrointestinal: Negative for abdominal distention, abdominal pain and blood in stool.  Endocrine: Negative for hot flashes.  Genitourinary: Negative for difficulty urinating and frequency.   Musculoskeletal: Negative for arthralgias.  Skin: Negative for itching and rash.  Neurological: Negative for extremity weakness.  Hematological: Negative for adenopathy.  Psychiatric/Behavioral: Negative for confusion.      No Known Allergies   Past Medical History:  Diagnosis Date  . COPD (chronic obstructive pulmonary disease) ([place])   . GERD (gastroesophageal reflux disease)   . Metastatic lung cancer (metastasis from lung to other site) [facility]) 11/02/2020     Past Surgical History:  Procedure Laterality Date  . COLONOSCOPY WITH ESOPHAGOGASTRODUODENOSCOPY (EGD)    . COLONOSCOPY WITH PROPOFOL N/A 11/14/2015   Procedure: COLONOSCOPY WITH PROPOFOL;  Surgeon: Hulen Luster, MD;  Location: [facility] ENDOSCOPY;  Service: Gastroenterology;  Laterality: N/A;  . ESOPHAGOGASTRODUODENOSCOPY (EGD) WITH PROPOFOL N/A 11/14/2015   Procedure: ESOPHAGOGASTRODUODENOSCOPY (EGD) WITH PROPOFOL;  Surgeon: Hulen Luster, MD;  Location: [facility] ENDOSCOPY;  Service: Gastroenterology;  Laterality: N/A;    Social History   Socioeconomic History  . Marital status: Married    Spouse name: Not on file  . Number of children: Not on file  . Years of education: Not on file  . Highest education level: Not on file  Occupational History  . Not on file  Tobacco Use  . Smoking status: Current Every Day Smoker    Packs/day: 0.20    Types: Cigarettes  . Smokeless tobacco: Never Used  Substance and Sexual  Activity  . Alcohol use: Never  . Drug use: Never  . Sexual activity: Not Currently  Other Topics Concern  . Not on file  Social History Narrative  . Not on file   Social Determinants of Health   Financial Resource Strain: Not on file  Food Insecurity: Not on file  Transportation Needs: Not on file  Physical Activity: Not on file  Stress: Not on file  Social Connections: Not on file  Intimate Partner Violence: Not on file    Family History  Problem Relation Age of Onset  . Breast cancer Cousin 54  . Diabetes type II Mother   . Hypertension Mother   . Colon cancer Father   . Hypertension Father      Current Outpatient Medications:  .  acetaminophen (TYLENOL) 500 MG tablet, Take 1,000 mg by mouth every 8 (eight) hours as needed for moderate pain. , Disp: , Rfl:  .  benzonatate (TESSALON) 100 MG capsule, Take 1 capsule (100 mg total) by mouth 3 (three) times daily as needed for cough., Disp: 60 capsule, Rfl: 1 .  dexamethasone (DECADRON) 4 MG tablet, Take 1 tab (4 mg) twice a day the day before Alimta chemo. Take 2 tablets (8 mg) po daily x 3 days starting the day after chemo., Disp: 30 tablet, Rfl: 1 .  feeding supplement (ENSURE ENLIVE / ENSURE PLUS) LIQD, Take 237 mLs by mouth 2 (two) times daily between meals., Disp: 237 mL, Rfl: 12 .  folic acid (FOLVITE) 1 MG tablet, Take 1 tablet (1 mg total) by mouth daily. Start 5-7 days before  Alimta chemotherapy. Continue until 21 days after Alimta completed., Disp: 100 tablet, Rfl: 3 .  guaiFENesin (MUCINEX) 600 MG 12 hr tablet, Take 1 tablet (600 mg total) by mouth 2 (two) times daily., Disp: 14 tablet, Rfl: 0 .  ipratropium-albuterol (DUONEB) 0.5-2.5 (3) MG/3ML SOLN, Take 3 mLs by nebulization 3 (three) times daily., Disp: 360 mL, Rfl: 1 .  magic mouthwash w/lidocaine SOLN, Take 5 mLs by mouth 4 (four) times daily as needed for mouth pain. Sig: Swish/Swallow 5-10 ml four times a day as needed. Dispense 480 ml. 1RF, Disp: 480 mL, Rfl:  1 .  omeprazole (PRILOSEC) 20 MG capsule, Take 1 capsule (20 mg total) by mouth 2 (two) times daily before a meal., Disp: 60 capsule, Rfl: 1 .  ondansetron (ZOFRAN) 8 MG tablet, Take 1 tablet (8 mg total) by mouth 2 (two) times daily as needed for refractory nausea / vomiting. Start on day 3 after chemo., Disp: 30 tablet, Rfl: 1 .  oxyCODONE (OXY IR/ROXICODONE) 5 MG immediate release tablet, Take 1 tablet (5 mg total) by mouth every 4 (four) hours as needed for severe pain., Disp: 60 tablet, Rfl: 0 .  Polyethylene Glycol 3350 (MIRALAX PO), Take by mouth daily as needed., Disp: , Rfl:  .  prochlorperazine (COMPAZINE) 10 MG tablet, Take 1 tablet (10 mg total) by mouth every 6 (six) hours as needed (Nausea or vomiting)., Disp: 30 tablet, Rfl: 1 .  senna-docusate (SENOKOT-S) 8.6-50 MG tablet, Take 2 tablets by mouth daily., Disp: 60 tablet, Rfl: 3 .  sucralfate (CARAFATE) 1 GM/10ML suspension, Take 10 mLs (1 g total) by mouth in the morning, at noon, and at bedtime., Disp: 420 mL, Rfl: 0 .  tiotropium (SPIRIVA HANDIHALER) 18 MCG inhalation capsule, Place 18 mcg into inhaler and inhale daily., Disp: , Rfl:  .  fentaNYL (DURAGESIC) 25 MCG/HR, Place 1 patch onto the skin every 3 (three) days. (Patient not taking: No sig reported), Disp: 2 patch, Rfl: 0 .  guaiFENesin-codeine (CHERATUSSIN AC) 100-10 MG/5ML syrup, Take 5 mLs by mouth 3 (three) times daily as needed for cough. (Patient not taking: No sig reported), Disp: 120 mL, Rfl: 0 .  HYDROcodone-homatropine (HYCODAN) 5-1.5 MG/5ML syrup, Take 5 mLs by mouth every 6 (six) hours as needed for cough. (Patient not taking: No sig reported), Disp: 120 mL, Rfl: 0 No current facility-administered medications for this visit.  Facility-Administered Medications Ordered in Other Visits:  .  sodium chloride flush (NS) 0.9 % injection 10 mL, 10 mL, Intracatheter, PRN, [name], MD  Physical exam: ECOG 2 Vitals:   03/01/21 0917  BP: 128/80  Pulse: (!) 101  Resp: 16   Temp: 98.5 F (36.9 C)  Weight: 136 lb 14.4 oz (62.1 kg)   Physical Exam Constitutional:      General: She is not in acute distress.    Comments: Thin built female walks independently  HENT:     Head: Normocephalic and atraumatic.  Eyes:     General: No scleral icterus. Cardiovascular:     Rate and Rhythm: Normal rate and regular rhythm.     Heart sounds: Normal heart sounds.  Pulmonary:     Effort: Pulmonary effort is normal. No respiratory distress.     Breath sounds: No wheezing.     Comments: Slightly decreased breath sound bilaterally.  Improved Abdominal:     General: Bowel sounds are normal. There is no distension.     Palpations: Abdomen is soft.  Musculoskeletal:  General: No deformity. Normal range of motion.     Cervical back: Normal range of motion and neck supple.  Skin:    General: Skin is warm and dry.     Findings: No erythema or rash.  Neurological:     Mental Status: She is alert and oriented to person, place, and time. Mental status is at baseline.     Cranial Nerves: No cranial nerve deficit.     Coordination: Coordination normal.  Psychiatric:        Mood and Affect: Mood normal.     CMP Latest Ref Rng & Units 03/01/2021  Glucose 70 - 99 mg/dL 93  BUN 8 - 23 mg/dL 25(H)  Creatinine 0.44 - 1.00 mg/dL 0.73  Sodium 135 - 145 mmol/L 138  Potassium 3.5 - 5.1 mmol/L 3.6  Chloride 98 - 111 mmol/L 103  CO2 22 - 32 mmol/L 25  Calcium 8.9 - 10.3 mg/dL 9.0  Total Protein 6.5 - 8.1 g/dL 7.4  Total Bilirubin 0.3 - 1.2 mg/dL 0.6  Alkaline Phos 38 - 126 U/L 76  AST 15 - 41 U/L 25  ALT 0 - 44 U/L 18   CBC Latest Ref Rng & Units 03/01/2021  WBC 4.0 - 10.5 K/uL 5.1  Hemoglobin 12.0 - 15.0 g/dL 11.0(L)  Hematocrit 36.0 - 46.0 % 33.8(L)  Platelets 150 - 400 K/uL 157    RADIOGRAPHIC STUDIES: I have personally reviewed the radiological images as listed and agreed with the findings in the report. No results found.   Assessment and plan  1.  Encounter for antineoplastic chemotherapy   2. Primary malignant neoplasm of lung metastatic to other site, unspecified laterality ([place])   3. SVC obstruction   4. Bone metastasis ([place])   5. Neoplasm related pain   6. 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  cT4 N3 M1-  KRASG12C (Lumakras in subsequent line can be considered) First-line treatment with carboplatin/Alimta/bevacizumab. Labs are reviewed and discussed with patient. Proceed with cycle 5 carboplatin/Alimta/bevacizumab. Discussed about obtaining CT scan for evaluation of treatment response. Clinically she appears doing better.  Last CT scan of the chest was done late January 2022.  I will obtain a CT after next cycle.  #Cough, intermittent shortness of breath, clinically improving.  #Bone metastasis.  Patient has increased back pain. She is on palliative radiation to the back, finishing on 03/06/2021. Discussed about rationale and side effects of bisphosphonate treatments.  Discussed about dental clearance.  She declined.  #Neoplasm related pain/treatment complication-pain appears to be stable. Continue oxycodone 5 mg every 4 hours as needed.  Patient declined long-acting narcotics.  #Bronchial obstruction and SVC occlusion.  s/p palliative radiation #Anemia, likely secondary to chemotherapy radiation and underlying malignancy.  Hemoglobin is 11 today.  Stable. #Right pleural effusion, stable.   Lab MD Norma Fredrickson, Alimta, bevacizumab, 3 weeks.  [name], MD, PhD Hematology Oncology [facility] Pager- [phone] 03/01/2021

## 2021-03-01 NOTE — Progress Notes (Signed)
Patient denies new problems/concerns today.   °

## 2021-03-02 ENCOUNTER — Ambulatory Visit
Admission: RE | Admit: 2021-03-02 | Discharge: 2021-03-02 | Disposition: A | Discharge status: Home / Self Care | Payer: BC Managed Care – PPO | Source: Ambulatory Visit | Attending: Radiation Oncology | Admitting: Radiation Oncology

## 2021-03-02 DIAGNOSIS — Z510 Encounter for antineoplastic radiation therapy: Secondary | ICD-10-CM | POA: Diagnosis not present

## 2021-03-03 ENCOUNTER — Ambulatory Visit
Admission: RE | Admit: 2021-03-03 | Discharge: 2021-03-03 | Disposition: A | Discharge status: Home / Self Care | Payer: BC Managed Care – PPO | Source: Ambulatory Visit | Attending: Radiation Oncology | Admitting: Radiation Oncology

## 2021-03-03 DIAGNOSIS — Z510 Encounter for antineoplastic radiation therapy: Secondary | ICD-10-CM | POA: Diagnosis not present

## 2021-03-06 ENCOUNTER — Other Ambulatory Visit: Payer: Self-pay | Admitting: *Deleted

## 2021-03-06 ENCOUNTER — Ambulatory Visit
Admission: RE | Admit: 2021-03-06 | Discharge: 2021-03-06 | Disposition: A | Discharge status: Home / Self Care | Payer: BC Managed Care – PPO | Source: Ambulatory Visit | Attending: Radiation Oncology | Admitting: Radiation Oncology

## 2021-03-06 DIAGNOSIS — C3490 Malignant neoplasm of unspecified part of unspecified bronchus or lung: Secondary | ICD-10-CM

## 2021-03-06 DIAGNOSIS — Z510 Encounter for antineoplastic radiation therapy: Secondary | ICD-10-CM | POA: Diagnosis not present

## 2021-03-06 MED ORDER — DEXAMETHASONE 4 MG PO TABS: ORAL_TABLET | ORAL | 1 refills | Status: DC

## 2021-03-07 ENCOUNTER — Other Ambulatory Visit: Payer: Self-pay | Admitting: *Deleted

## 2021-03-07 MED ORDER — OXYCODONE HCL 5 MG PO TABS: 5.0000 mg | ORAL_TABLET | ORAL | 0 refills | Status: DC | PRN

## 2021-03-22 ENCOUNTER — Inpatient Hospital Stay: Payer: BC Managed Care – PPO | Attending: Oncology

## 2021-03-22 ENCOUNTER — Other Ambulatory Visit: Payer: Self-pay

## 2021-03-22 ENCOUNTER — Inpatient Hospital Stay: Payer: BC Managed Care – PPO

## 2021-03-22 ENCOUNTER — Inpatient Hospital Stay (HOSPITAL_BASED_OUTPATIENT_CLINIC_OR_DEPARTMENT_OTHER): Payer: BC Managed Care – PPO | Admitting: Oncology

## 2021-03-22 ENCOUNTER — Encounter: Payer: Self-pay | Admitting: Oncology

## 2021-03-22 VITALS — BP 133/79 | HR 109 | Resp 23

## 2021-03-22 VITALS — BP 120/81 | HR 106 | Temp 97.8°F | Resp 20 | Wt 143.3 lb

## 2021-03-22 DIAGNOSIS — Z5112 Encounter for antineoplastic immunotherapy: Secondary | ICD-10-CM | POA: Insufficient documentation

## 2021-03-22 DIAGNOSIS — G893 Neoplasm related pain (acute) (chronic): Secondary | ICD-10-CM | POA: Diagnosis not present

## 2021-03-22 DIAGNOSIS — K219 Gastro-esophageal reflux disease without esophagitis: Secondary | ICD-10-CM | POA: Insufficient documentation

## 2021-03-22 DIAGNOSIS — C7951 Secondary malignant neoplasm of bone: Secondary | ICD-10-CM

## 2021-03-22 DIAGNOSIS — D630 Anemia in neoplastic disease: Secondary | ICD-10-CM | POA: Diagnosis not present

## 2021-03-22 DIAGNOSIS — C3490 Malignant neoplasm of unspecified part of unspecified bronchus or lung: Secondary | ICD-10-CM

## 2021-03-22 DIAGNOSIS — G35 Multiple sclerosis: Secondary | ICD-10-CM | POA: Diagnosis not present

## 2021-03-22 DIAGNOSIS — Z800 Family history of malignant neoplasm of digestive organs: Secondary | ICD-10-CM | POA: Diagnosis not present

## 2021-03-22 DIAGNOSIS — Z5111 Encounter for antineoplastic chemotherapy: Secondary | ICD-10-CM | POA: Diagnosis present

## 2021-03-22 DIAGNOSIS — J449 Chronic obstructive pulmonary disease, unspecified: Secondary | ICD-10-CM | POA: Insufficient documentation

## 2021-03-22 DIAGNOSIS — T8090XA Unspecified complication following infusion and therapeutic injection, initial encounter: Secondary | ICD-10-CM

## 2021-03-22 DIAGNOSIS — F17210 Nicotine dependence, cigarettes, uncomplicated: Secondary | ICD-10-CM | POA: Diagnosis not present

## 2021-03-22 DIAGNOSIS — I871 Compression of vein: Secondary | ICD-10-CM

## 2021-03-22 DIAGNOSIS — Z803 Family history of malignant neoplasm of breast: Secondary | ICD-10-CM | POA: Diagnosis not present

## 2021-03-22 DIAGNOSIS — J910 Malignant pleural effusion: Secondary | ICD-10-CM | POA: Diagnosis not present

## 2021-03-22 LAB — COMPREHENSIVE METABOLIC PANEL WITH GFR
ALT: 17 U/L (ref 0–44)
AST: 19 U/L (ref 15–41)
Albumin: 3.2 g/dL — ABNORMAL LOW (ref 3.5–5.0)
Alkaline Phosphatase: 70 U/L (ref 38–126)
Anion gap: 9 (ref 5–15)
BUN: 22 mg/dL (ref 8–23)
CO2: 24 mmol/L (ref 22–32)
Calcium: 8.8 mg/dL — ABNORMAL LOW (ref 8.9–10.3)
Chloride: 103 mmol/L (ref 98–111)
Creatinine, Ser: 0.51 mg/dL (ref 0.44–1.00)
GFR, Estimated: >60 mL/min
Glucose, Bld: 107 mg/dL — ABNORMAL HIGH (ref 70–99)
Potassium: 3.8 mmol/L (ref 3.5–5.1)
Sodium: 136 mmol/L (ref 135–145)
Total Bilirubin: 0.4 mg/dL (ref 0.3–1.2)
Total Protein: 7.7 g/dL (ref 6.5–8.1)

## 2021-03-22 LAB — CBC WITH DIFFERENTIAL/PLATELET
Abs Immature Granulocytes: 0.02 10*3/uL (ref 0.00–0.07)
Basophils Absolute: 0 10*3/uL (ref 0.0–0.1)
Basophils Relative: 0 %
Eosinophils Absolute: 0 10*3/uL (ref 0.0–0.5)
Eosinophils Relative: 0 %
HCT: 33.5 % — ABNORMAL LOW (ref 36.0–46.0)
Hemoglobin: 10.9 g/dL — ABNORMAL LOW (ref 12.0–15.0)
Immature Granulocytes: 0 %
Lymphocytes Relative: 9 %
Lymphs Abs: 0.6 10*3/uL — ABNORMAL LOW (ref 0.7–4.0)
MCH: 28.2 pg (ref 26.0–34.0)
MCHC: 32.5 g/dL (ref 30.0–36.0)
MCV: 86.6 fL (ref 80.0–100.0)
Monocytes Absolute: 0.6 10*3/uL (ref 0.1–1.0)
Monocytes Relative: 11 %
Neutro Abs: 4.7 10*3/uL (ref 1.7–7.7)
Neutrophils Relative %: 80 %
Platelets: 142 10*3/uL — ABNORMAL LOW (ref 150–400)
RBC: 3.87 MIL/uL (ref 3.87–5.11)
RDW: 17.2 % — ABNORMAL HIGH (ref 11.5–15.5)
WBC: 5.9 10*3/uL (ref 4.0–10.5)
nRBC: 0 % (ref 0.0–0.2)

## 2021-03-22 LAB — PROTEIN, URINE, RANDOM: Total Protein, Urine: 10 mg/dL

## 2021-03-22 MED ORDER — PALONOSETRON HCL INJECTION 0.25 MG/5ML
0.2500 mg | Freq: Once | INTRAVENOUS | Status: AC
  Administered 2021-03-22: 0.25 mg via INTRAVENOUS
  Filled 2021-03-22: qty 5

## 2021-03-22 MED ORDER — CYANOCOBALAMIN 1000 MCG/ML IJ SOLN
1000.0000 ug | Freq: Once | INTRAMUSCULAR | Status: AC
  Administered 2021-03-22: 1000 ug via INTRAMUSCULAR
  Filled 2021-03-22: qty 1

## 2021-03-22 MED ORDER — SODIUM CHLORIDE 0.9 % IV SOLN
500.0000 mg/m2 | Freq: Once | INTRAVENOUS | Status: AC
  Administered 2021-03-22: 900 mg via INTRAVENOUS
  Filled 2021-03-22: qty 20

## 2021-03-22 MED ORDER — SODIUM CHLORIDE 0.9 % IV SOLN
15.0000 mg/kg | Freq: Once | INTRAVENOUS | Status: AC
  Administered 2021-03-22: 900 mg via INTRAVENOUS
  Filled 2021-03-22: qty 32

## 2021-03-22 MED ORDER — SODIUM CHLORIDE 0.9 % IV SOLN
10.0000 mg | Freq: Once | INTRAVENOUS | Status: AC
  Administered 2021-03-22: 10 mg via INTRAVENOUS
  Filled 2021-03-22: qty 10

## 2021-03-22 MED ORDER — OXYCODONE HCL 5 MG PO TABS: 5.0000 mg | ORAL_TABLET | Freq: Four times a day (QID) | ORAL | 0 refills | Status: DC | PRN

## 2021-03-22 MED ORDER — SODIUM CHLORIDE 0.9 % IV SOLN
Freq: Once | INTRAVENOUS | Status: AC
Start: 2021-03-22 — End: 2021-03-22
  Filled 2021-03-22: qty 250

## 2021-03-22 MED ORDER — SODIUM CHLORIDE 0.9 % IV SOLN
150.0000 mg | Freq: Once | INTRAVENOUS | Status: AC
  Administered 2021-03-22: 150 mg via INTRAVENOUS
  Filled 2021-03-22: qty 150

## 2021-03-22 MED ORDER — FAMOTIDINE IN NACL 20-0.9 MG/50ML-% IV SOLN
20.0000 mg | Freq: Once | INTRAVENOUS | Status: AC
  Administered 2021-03-22: 20 mg via INTRAVENOUS
  Filled 2021-03-22: qty 50

## 2021-03-22 MED ORDER — SODIUM CHLORIDE 0.9 % IV SOLN
498.5000 mg | Freq: Once | INTRAVENOUS | Status: AC
  Administered 2021-03-22: 500 mg via INTRAVENOUS
  Filled 2021-03-22: qty 50

## 2021-03-22 MED ORDER — DIPHENHYDRAMINE HCL 50 MG/ML IJ SOLN
50.0000 mg | Freq: Once | INTRAMUSCULAR | Status: AC | PRN
  Administered 2021-03-22: 50 mg via INTRAVENOUS

## 2021-03-22 MED ORDER — BENZONATATE 100 MG PO CAPS: 100.0000 mg | ORAL_CAPSULE | Freq: Three times a day (TID) | ORAL | 1 refills | Status: DC | PRN

## 2021-03-22 MED ORDER — FAMOTIDINE 20 MG IN NS 100 ML IVPB
20.0000 mg | Freq: Once | INTRAVENOUS | Status: DC | PRN
  Filled 2021-03-22: qty 100

## 2021-03-22 MED ORDER — SODIUM CHLORIDE 0.9 % IV SOLN
Freq: Once | INTRAVENOUS | Status: DC | PRN
  Filled 2021-03-22: qty 250

## 2021-03-22 MED ORDER — METHYLPREDNISOLONE SODIUM SUCC 125 MG IJ SOLR
125.0000 mg | Freq: Once | INTRAMUSCULAR | Status: AC | PRN
  Administered 2021-03-22: 125 mg via INTRAVENOUS

## 2021-03-22 MED ORDER — SUCRALFATE 1 GM/10ML PO SUSP: 1.0000 g | Freq: Three times a day (TID) | ORAL | 0 refills | Status: DC

## 2021-03-22 NOTE — Progress Notes (Signed)
Hematology/Oncology Follow Up Note [facility]  [phone] [phone]  Patient Care Team: Ranae Plumber, [place] as PCP - General (Family Medicine) Telford Nab, RN as Oncology Nurse Navigator [name], MD as Medical Oncologist (Oncology)   Name of the patient: Brittney Fisher  [id number]  1961-02-04   REASON FOR VISIT  follow-up for lung cancer  PERTINENT ONCOLOGY HISTORY 60 y.o. female with past medical history including GERD, COPD, current everyday smoker presents for follow-up of lung mass and pleural effusion. 10/20/2020, patient was brought to ED via EMS due to generalized weakness, dizziness, chest pain shortness of breath.  She was recently diagnosed with COPD approximately 1 month ago by primary care provider.  Also unintentional weight loss during the last few months. Image work-up showed right-sided pleural effusion.  She underwent right thoracentesis.  With removal of 2.4 L of fluid. 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  Metastatic disease to low cervical/thoracic nodes, lung, right pleural space and bones.  Moderate to large right pleural effusion.  SVC narrowing. 10/21/2020 MRI brain is negative for metastasis.  Mild chronic microvascular ischemic changes in the white matter.  Negative for acute infarct Regarding to the SVC narrowing, patient was seen by vascular surgeon and was recommended no intervention inpatient.  Patient to follow-up outpatient with vascular surgeon for evaluation. Patient also was treated for COPD exacerbation with hypoxia on exertion. Qualifies for home oxygen and hospitalist arrange home health and a nebulizer.  Patient was given a course of prednisone taper and empiric Levaquin. Patient was discharged and present today to follow-up with cytology results and further management plan  10/27/2020 PET scan showed right  upper lobe primary bronchogenic mass with direct invasion into mediastinum. Metastatic disease to the right pleural space, lung, bone, nodes of the chest and less so lower leg/upper abdomen. Hypermetabolic him along the course of the right axillary vein with concurrent subtle hyper attenuation within the axillary vein and SVC.  This continues to the level of SVC narrowing.  Suspicious for SVC occlusion and developing thrombus. Moderate right pleural effusion and small pericardial effusion.  # # left supraclavicular mass biopsy- pathology is positive for adenocarcinoma. positive for CK7, with patchy, weak CK20. They are  negative for TTF1, NapsinA, GATA3, CDX2, and Pax8. The findings are  nonspecific; but may be compatible with a poorly differentiated  adenocarcinoma of lung origin, especially given the imaging findings  She also had another repeat thoracentesis and fluid cytology showed malignancy.  #NGS showed AXIN 1 [id number], DIS2 M173f, KRAS G12C, PRDM1 M1 [id number], [id number] MS stable, TPS <1%    11/14/2020- 12/12/2020. palliative chest RT    #After 2 cycles of chemotherapy, patient had CT scan done due to shortness of breath.  01/05/2021, CT chest angiogram PE protocol showed no evidence of pulmonary embolism.  Stable disease.  Right upper lobe mass about 6.7 x 5.3 cm.  Widespread metastatic disease, lung and bone involvement. Interval development of right-sided hydronephrosis.  She is on palliative radiation to the back, finishing on 03/06/2021.  INTERVAL HISTORY Brittney CORTEZis a 60y.o. female who has above history reviewed by me today presents for follow up visit for management of metastatic lung cancer, acute visit for swallowing pain.  Problems and complaints are listed below Patient reports feeling better.  Appetite has improved.  She has gained 8 pounds since last visit.  Intermittent cough, not much.  Symptoms are  stable.  Review of Systems  Constitutional: Negative  for chills, fatigue, fever and unexpected weight change.  HENT:   Negative for hearing loss and voice change.   Eyes: Negative for eye problems.  Respiratory: Positive for cough. Negative for chest tightness and shortness of breath.   Cardiovascular: Negative for chest pain.  Gastrointestinal: Negative for abdominal distention, abdominal pain and blood in stool.  Endocrine: Negative for hot flashes.  Genitourinary: Negative for difficulty urinating and frequency.   Musculoskeletal: Negative for arthralgias.  Skin: Negative for itching and rash.  Neurological: Negative for extremity weakness.  Hematological: Negative for adenopathy.  Psychiatric/Behavioral: Negative for confusion.      No Known Allergies   Past Medical History:  Diagnosis Date  . COPD (chronic obstructive pulmonary disease) ([place])   . GERD (gastroesophageal reflux disease)   . Metastatic lung cancer (metastasis from lung to other site) [facility]) 11/02/2020     Past Surgical History:  Procedure Laterality Date  . COLONOSCOPY WITH ESOPHAGOGASTRODUODENOSCOPY (EGD)    . COLONOSCOPY WITH PROPOFOL N/A 11/14/2015   Procedure: COLONOSCOPY WITH PROPOFOL;  Surgeon: Hulen Luster, MD;  Location: [facility] ENDOSCOPY;  Service: Gastroenterology;  Laterality: N/A;  . ESOPHAGOGASTRODUODENOSCOPY (EGD) WITH PROPOFOL N/A 11/14/2015   Procedure: ESOPHAGOGASTRODUODENOSCOPY (EGD) WITH PROPOFOL;  Surgeon: Hulen Luster, MD;  Location: [facility] ENDOSCOPY;  Service: Gastroenterology;  Laterality: N/A;    Social History   Socioeconomic History  . Marital status: Married    Spouse name: Not on file  . Number of children: Not on file  . Years of education: Not on file  . Highest education level: Not on file  Occupational History  . Not on file  Tobacco Use  . Smoking status: Current Every Day Smoker    Packs/day: 0.20    Types: Cigarettes  . Smokeless tobacco: Never Used  Substance and Sexual Activity  . Alcohol use: Never  . Drug use: Never  .  Sexual activity: Not Currently  Other Topics Concern  . Not on file  Social History Narrative  . Not on file   Social Determinants of Health   Financial Resource Strain: Not on file  Food Insecurity: Not on file  Transportation Needs: Not on file  Physical Activity: Not on file  Stress: Not on file  Social Connections: Not on file  Intimate Partner Violence: Not on file    Family History  Problem Relation Age of Onset  . Breast cancer Cousin 50  . Diabetes type II Mother   . Hypertension Mother   . Colon cancer Father   . Hypertension Father      Current Outpatient Medications:  .  acetaminophen (TYLENOL) 500 MG tablet, Take 1,000 mg by mouth every 8 (eight) hours as needed for moderate pain. , Disp: , Rfl:  .  benzonatate (TESSALON) 100 MG capsule, Take 1 capsule (100 mg total) by mouth 3 (three) times daily as needed for cough., Disp: 60 capsule, Rfl: 1 .  dexamethasone (DECADRON) 4 MG tablet, Take 1 tab (4 mg) twice a day the day before Alimta chemo. Take 2 tablets (8 mg) po daily x 3 days starting the day after chemo., Disp: 30 tablet, Rfl: 1 .  feeding supplement (ENSURE ENLIVE / ENSURE PLUS) LIQD, Take 237 mLs by mouth 2 (two) times daily between meals., Disp: 237 mL, Rfl: 12 .  fentaNYL (DURAGESIC) 25 MCG/HR, Place 1 patch onto the skin every 3 (three) days. (Patient not taking: No sig reported), Disp: 2 patch, Rfl: 0 .  folic acid (FOLVITE) 1 MG tablet, Take 1 tablet (1 mg total) by mouth daily. Start 5-7 days before Alimta chemotherapy. Continue until 21 days after Alimta completed., Disp: 100 tablet, Rfl: 3 .  guaiFENesin (MUCINEX) 600 MG 12 hr tablet, Take 1 tablet (600 mg total) by mouth 2 (two) times daily., Disp: 14 tablet, Rfl: 0 .  guaiFENesin-codeine (CHERATUSSIN AC) 100-10 MG/5ML syrup, Take 5 mLs by mouth 3 (three) times daily as needed for cough. (Patient not taking: No sig reported), Disp: 120 mL, Rfl: 0 .  HYDROcodone-homatropine (HYCODAN) 5-1.5 MG/5ML syrup,  Take 5 mLs by mouth every 6 (six) hours as needed for cough. (Patient not taking: No sig reported), Disp: 120 mL, Rfl: 0 .  ipratropium-albuterol (DUONEB) 0.5-2.5 (3) MG/3ML SOLN, Take 3 mLs by nebulization 3 (three) times daily., Disp: 360 mL, Rfl: 1 .  magic mouthwash w/lidocaine SOLN, Take 5 mLs by mouth 4 (four) times daily as needed for mouth pain. Sig: Swish/Swallow 5-10 ml four times a day as needed. Dispense 480 ml. 1RF, Disp: 480 mL, Rfl: 1 .  omeprazole (PRILOSEC) 20 MG capsule, Take 1 capsule (20 mg total) by mouth 2 (two) times daily before a meal., Disp: 60 capsule, Rfl: 1 .  ondansetron (ZOFRAN) 8 MG tablet, Take 1 tablet (8 mg total) by mouth 2 (two) times daily as needed for refractory nausea / vomiting. Start on day 3 after chemo., Disp: 30 tablet, Rfl: 1 .  oxyCODONE (OXY IR/ROXICODONE) 5 MG immediate release tablet, Take 1 tablet (5 mg total) by mouth every 4 (four) hours as needed for severe pain., Disp: 60 tablet, Rfl: 0 .  Polyethylene Glycol 3350 (MIRALAX PO), Take by mouth daily as needed., Disp: , Rfl:  .  prochlorperazine (COMPAZINE) 10 MG tablet, Take 1 tablet (10 mg total) by mouth every 6 (six) hours as needed (Nausea or vomiting)., Disp: 30 tablet, Rfl: 1 .  senna-docusate (SENOKOT-S) 8.6-50 MG tablet, Take 2 tablets by mouth daily., Disp: 60 tablet, Rfl: 3 .  sucralfate (CARAFATE) 1 GM/10ML suspension, Take 10 mLs (1 g total) by mouth in the morning, at noon, and at bedtime., Disp: 420 mL, Rfl: 0 .  tiotropium (SPIRIVA HANDIHALER) 18 MCG inhalation capsule, Place 18 mcg into inhaler and inhale daily., Disp: , Rfl:   Physical exam: ECOG 1 Vitals:   03/22/21 0844  BP: 120/81  Pulse: (!) 106  Resp: 20  Temp: 97.8 F (36.6 C)  TempSrc: Tympanic  SpO2: 100%  Weight: 143 lb 4.8 oz (65 kg)   Physical Exam Constitutional:      General: She is not in acute distress.    Comments: Thin built female walks independently  HENT:     Head: Normocephalic and atraumatic.   Eyes:     General: No scleral icterus. Cardiovascular:     Rate and Rhythm: Normal rate and regular rhythm.     Heart sounds: Normal heart sounds.  Pulmonary:     Effort: Pulmonary effort is normal. No respiratory distress.     Breath sounds: No wheezing.     Comments: Slightly decreased breath sound bilaterally.  Improved Abdominal:     General: Bowel sounds are normal. There is no distension.     Palpations: Abdomen is soft.  Musculoskeletal:        General: No deformity. Normal range of motion.     Cervical back: Normal range of motion and neck supple.  Skin:    General: Skin is warm and dry.  Findings: No erythema or rash.  Neurological:     Mental Status: She is alert and oriented to person, place, and time. Mental status is at baseline.     Cranial Nerves: No cranial nerve deficit.     Coordination: Coordination normal.  Psychiatric:        Mood and Affect: Mood normal.     CMP Latest Ref Rng & Units 03/01/2021  Glucose 70 - 99 mg/dL 93  BUN 8 - 23 mg/dL 25(H)  Creatinine 0.44 - 1.00 mg/dL 0.73  Sodium 135 - 145 mmol/L 138  Potassium 3.5 - 5.1 mmol/L 3.6  Chloride 98 - 111 mmol/L 103  CO2 22 - 32 mmol/L 25  Calcium 8.9 - 10.3 mg/dL 9.0  Total Protein 6.5 - 8.1 g/dL 7.4  Total Bilirubin 0.3 - 1.2 mg/dL 0.6  Alkaline Phos 38 - 126 U/L 76  AST 15 - 41 U/L 25  ALT 0 - 44 U/L 18   CBC Latest Ref Rng & Units 03/01/2021  WBC 4.0 - 10.5 K/uL 5.1  Hemoglobin 12.0 - 15.0 g/dL 11.0(L)  Hematocrit 36.0 - 46.0 % 33.8(L)  Platelets 150 - 400 K/uL 157    RADIOGRAPHIC STUDIES: I have personally reviewed the radiological images as listed and agreed with the findings in the report. No results found.   Assessment and plan  1. Primary malignant neoplasm of lung metastatic to other site, unspecified laterality ([place])   2. Encounter for antineoplastic chemotherapy   3. SVC obstruction   4. Bone metastasis ([place])   5. Neoplasm related pain   6. 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  cT4 N3 M1-  KRASG12C (Lumakras in subsequent line can be considered) First-line treatment with carboplatin/Alimta/bevacizumab. Labs are reviewed and discussed with patient. Proceed with cycle 6 carboplatin Alimta and bevacizumab I will obtain CT chest abdomen pelvis prior to the next visit.  Had infusion reaction halfway into carboplatin treatment.  Treatment was stopped. Symptoms improved after being given IV fluid normal saline, Benadryl, Solu-Medrol, Pepcid.  Vital signs remained stable. Will adjust her appointment to Alimta and bevacizumab the next visit.  #Bone metastasis.  Patient has increased back pain.  Discussed about rationale and side effects of bisphosphonate treatments.  Discussed about dental clearance.  She declined.  #Neoplasm related pain/treatment complication-pain appears to be stable. Continue oxycodone 5 mg every 4 hours as needed.  Patient declined long-acting narcotics.  #Bronchial obstruction and SVC occlusion.  s/p palliative radiation #Anemia, likely secondary to chemotherapy radiation and underlying malignancy.  Monitor hemoglobin. #Right pleural effusion, stable.   Lab MD Alimta, bevacizumab, 3 weeks.  [name], MD, PhD Hematology Oncology [facility] at [facility] Pager- [phone] 03/22/2021

## 2021-03-22 NOTE — Progress Notes (Signed)
At 1210- pt was heard coughing, 1/2 way into carboplatin. Pt states has itching & trouble breathin, coughing continues.Carboplatin stopped. Pt was not coughing at all prior. See VS flow sheet for vitals.  NS 1 liter @ 999. Benadryl 50 IVP given @ 1210. Solumedrol 125 mg IVP given 1211. Rulon Abide @ chairside at 1212 to evaluate pt. . Pepcid 20 mg given IVPB @1215 - had to restart IV. VS stable- 1229- pt states itching seems alittle worse- but breathing &m coughing improved. Per MD/ NP - will not re-challenge [place]. Pt can be discharged once s/s resolved. Pt states symptoms resolved. No more coughing or labored breathing. Pt discharged stable with family member

## 2021-03-23 ENCOUNTER — Telehealth: Payer: Self-pay

## 2021-03-23 ENCOUNTER — Inpatient Hospital Stay: Payer: BC Managed Care – PPO

## 2021-03-23 NOTE — Telephone Encounter (Signed)
Nutrition  Called patient for scheduled phone nutrition follow-up visit.  No answer and no option to leave voicemail as mailbox full.    RD will attempt to see patient on 4/27 during infusion.  Brittney Mcgarvey B. [name], [place], [place] Registered Dietitian [phone] (mobile)

## 2021-04-07 ENCOUNTER — Other Ambulatory Visit: Payer: Self-pay

## 2021-04-07 ENCOUNTER — Ambulatory Visit
Admission: RE | Admit: 2021-04-07 | Discharge: 2021-04-07 | Disposition: A | Discharge status: Home / Self Care | Payer: BC Managed Care – PPO | Source: Ambulatory Visit | Attending: Oncology | Admitting: Oncology

## 2021-04-07 DIAGNOSIS — C3490 Malignant neoplasm of unspecified part of unspecified bronchus or lung: Secondary | ICD-10-CM | POA: Insufficient documentation

## 2021-04-07 MED ORDER — IOHEXOL 300 MG/ML  SOLN
100.0000 mL | Freq: Once | INTRAMUSCULAR | Status: AC | PRN
  Administered 2021-04-07: 80 mL via INTRAVENOUS

## 2021-04-10 ENCOUNTER — Encounter: Payer: Self-pay | Admitting: Radiation Oncology

## 2021-04-10 ENCOUNTER — Ambulatory Visit
Admission: RE | Admit: 2021-04-10 | Discharge: 2021-04-10 | Disposition: A | Discharge status: Home / Self Care | Payer: BC Managed Care – PPO | Source: Ambulatory Visit | Attending: Radiation Oncology | Admitting: Radiation Oncology

## 2021-04-10 VITALS — BP 137/74 | HR 111 | Temp 97.9°F | Wt 148.0 lb

## 2021-04-10 DIAGNOSIS — Z923 Personal history of irradiation: Secondary | ICD-10-CM | POA: Diagnosis not present

## 2021-04-10 DIAGNOSIS — C3411 Malignant neoplasm of upper lobe, right bronchus or lung: Secondary | ICD-10-CM | POA: Diagnosis not present

## 2021-04-10 DIAGNOSIS — M25551 Pain in right hip: Secondary | ICD-10-CM | POA: Diagnosis not present

## 2021-04-10 DIAGNOSIS — C7951 Secondary malignant neoplasm of bone: Secondary | ICD-10-CM | POA: Diagnosis not present

## 2021-04-10 DIAGNOSIS — C3490 Malignant neoplasm of unspecified part of unspecified bronchus or lung: Secondary | ICD-10-CM

## 2021-04-10 NOTE — Progress Notes (Signed)
Radiation Oncology Follow up Note  Name: Brittney Fisher   Date:   04/10/2021 MRN:  [id number] DOB: [birth year]    This 60 y.o. female presents to the clinic today for 1 month follow-up status post radiation therapy to the T2 vertebral body for palliative treatment of known stage IV adenocarcinoma lung.  REFERRING PROVIDER: Ranae Plumber, PA  HPI: Patient is a 60 year old female previously treated to her chest to prevent superior vena cava syndrome patient with known stage IV adenocarcinoma of the lung.  She is now 1 month out from completing palliative radiation therapy to her T2 to vertebral body with excellent palliative results.  She is in no pain in that region of her spine she has some slight occasional right hip pain.  She has been currently receiving treatment.  With carboplatinum Alimta and bevacizumab.  She had allergic reaction to the carboplatinum and that I believe is can to be discontinued.  She is ambulating well.  COMPLICATIONS OF TREATMENT: none  FOLLOW UP COMPLIANCE: keeps appointments   PHYSICAL EXAM:  BP 137/74   Pulse (!) 111   Temp 97.9 F (36.6 C) (Tympanic)   Wt 148 lb (67.1 kg)   BMI 20.64 kg/m  Range of motion of lower extremities does not elicit pain deep palpation of her spine does not elicit pain.  Well-developed well-nourished patient in NAD. HEENT reveals PERLA, EOMI, discs not visualized.  Oral cavity is clear. No oral mucosal lesions are identified. Neck is clear without evidence of cervical or supraclavicular adenopathy. Lungs are clear to A&P. Cardiac examination is essentially unremarkable with regular rate and rhythm without murmur rub or thrill. Abdomen is benign with no organomegaly or masses noted. Motor sensory and DTR levels are equal and symmetric in the upper and lower extremities. Cranial nerves II through XII are grossly intact. Proprioception is intact. No peripheral adenopathy or edema is identified. No motor or sensory levels are noted. Crude  visual fields are within normal range.  RADIOLOGY RESULTS: No current films to review  PLAN: Present time she is has obtained excellent palliative benefit from her T2 radiation therapy.  I am pleased with her overall progress.  She continues treatment under the direction of Dr. Domenica Fail medical oncology.  I will discontinue follow-up care.  Be happy to reevaluate the patient anytime should further palliative treatment be indicated.  Patient knows to call with any concerns.  I would like to take this opportunity to thank you for allowing me to participate in the care of your patient.Noreene Filbert, MD

## 2021-04-12 ENCOUNTER — Inpatient Hospital Stay (HOSPITAL_BASED_OUTPATIENT_CLINIC_OR_DEPARTMENT_OTHER): Payer: BC Managed Care – PPO | Admitting: Oncology

## 2021-04-12 ENCOUNTER — Inpatient Hospital Stay: Payer: BC Managed Care – PPO

## 2021-04-12 ENCOUNTER — Encounter: Payer: Self-pay | Admitting: Oncology

## 2021-04-12 VITALS — HR 100

## 2021-04-12 VITALS — BP 127/78 | HR 102 | Temp 98.2°F | Resp 18 | Wt 149.0 lb

## 2021-04-12 DIAGNOSIS — G893 Neoplasm related pain (acute) (chronic): Secondary | ICD-10-CM

## 2021-04-12 DIAGNOSIS — C3490 Malignant neoplasm of unspecified part of unspecified bronchus or lung: Secondary | ICD-10-CM

## 2021-04-12 DIAGNOSIS — C7951 Secondary malignant neoplasm of bone: Secondary | ICD-10-CM | POA: Diagnosis not present

## 2021-04-12 DIAGNOSIS — Z5111 Encounter for antineoplastic chemotherapy: Secondary | ICD-10-CM

## 2021-04-12 DIAGNOSIS — D649 Anemia, unspecified: Secondary | ICD-10-CM

## 2021-04-12 DIAGNOSIS — Z5112 Encounter for antineoplastic immunotherapy: Secondary | ICD-10-CM | POA: Diagnosis not present

## 2021-04-12 LAB — CBC WITH DIFFERENTIAL/PLATELET
Abs Immature Granulocytes: 0.02 10*3/uL (ref 0.00–0.07)
Basophils Absolute: 0 10*3/uL (ref 0.0–0.1)
Basophils Relative: 0 %
Eosinophils Absolute: 0 10*3/uL (ref 0.0–0.5)
Eosinophils Relative: 1 %
HCT: 35.9 % — ABNORMAL LOW (ref 36.0–46.0)
Hemoglobin: 11.6 g/dL — ABNORMAL LOW (ref 12.0–15.0)
Immature Granulocytes: 0 %
Lymphocytes Relative: 18 %
Lymphs Abs: 1 10*3/uL (ref 0.7–4.0)
MCH: 28.2 pg (ref 26.0–34.0)
MCHC: 32.3 g/dL (ref 30.0–36.0)
MCV: 87.1 fL (ref 80.0–100.0)
Monocytes Absolute: 0.7 10*3/uL (ref 0.1–1.0)
Monocytes Relative: 12 %
Neutro Abs: 4 10*3/uL (ref 1.7–7.7)
Neutrophils Relative %: 69 %
Platelets: 205 10*3/uL (ref 150–400)
RBC: 4.12 MIL/uL (ref 3.87–5.11)
RDW: 16.8 % — ABNORMAL HIGH (ref 11.5–15.5)
WBC: 5.7 10*3/uL (ref 4.0–10.5)
nRBC: 0 % (ref 0.0–0.2)

## 2021-04-12 LAB — COMPREHENSIVE METABOLIC PANEL
ALT: 14 U/L (ref 0–44)
AST: 21 U/L (ref 15–41)
Albumin: 3.4 g/dL — ABNORMAL LOW (ref 3.5–5.0)
Alkaline Phosphatase: 67 U/L (ref 38–126)
Anion gap: 9 (ref 5–15)
BUN: 21 mg/dL (ref 8–23)
CO2: 26 mmol/L (ref 22–32)
Calcium: 9.1 mg/dL (ref 8.9–10.3)
Chloride: 105 mmol/L (ref 98–111)
Creatinine, Ser: 0.73 mg/dL (ref 0.44–1.00)
GFR, Estimated: >60 mL/min (ref >60)
Glucose, Bld: 93 mg/dL (ref 70–99)
Potassium: 3.4 mmol/L — ABNORMAL LOW (ref 3.5–5.1)
Sodium: 140 mmol/L (ref 135–145)
Total Bilirubin: 0.4 mg/dL (ref 0.3–1.2)
Total Protein: 7.5 g/dL (ref 6.5–8.1)

## 2021-04-12 LAB — PROTEIN, URINE, RANDOM: Total Protein, Urine: 8 mg/dL

## 2021-04-12 MED ORDER — SODIUM CHLORIDE 0.9 % IV SOLN: 150.0000 mg | Freq: Once | INTRAVENOUS | Status: DC

## 2021-04-12 MED ORDER — BEVACIZUMAB-BVZR CHEMO INJECTION 400 MG/16ML: 15.0000 mg/kg | Freq: Once | INTRAVENOUS | Status: DC

## 2021-04-12 MED ORDER — PALONOSETRON HCL INJECTION 0.25 MG/5ML: 0.2500 mg | Freq: Once | INTRAVENOUS | Status: DC

## 2021-04-12 MED ORDER — SODIUM CHLORIDE 0.9 % IV SOLN
500.0000 mg/m2 | Freq: Once | INTRAVENOUS | Status: AC
  Administered 2021-04-12: 900 mg via INTRAVENOUS
  Filled 2021-04-12: qty 20

## 2021-04-12 MED ORDER — DEXAMETHASONE SODIUM PHOSPHATE 100 MG/10ML IJ SOLN: 10.0000 mg | Freq: Once | INTRAMUSCULAR | Status: DC

## 2021-04-12 MED ORDER — SODIUM CHLORIDE 0.9 % IV SOLN
Freq: Once | INTRAVENOUS | Status: AC
  Filled 2021-04-12: qty 250

## 2021-04-12 MED ORDER — SODIUM CHLORIDE 0.9 % IV SOLN
15.0000 mg/kg | Freq: Once | INTRAVENOUS | Status: AC
  Administered 2021-04-12: 1000 mg via INTRAVENOUS
  Filled 2021-04-12: qty 32

## 2021-04-12 MED ORDER — PROCHLORPERAZINE MALEATE 10 MG PO TABS
10.0000 mg | ORAL_TABLET | Freq: Once | ORAL | Status: AC
  Administered 2021-04-12: 10 mg via ORAL
  Filled 2021-04-12: qty 1

## 2021-04-12 NOTE — Progress Notes (Signed)
Patient denies new problems/concerns today.   °

## 2021-04-12 NOTE — Progress Notes (Signed)
Nutrition Follow-up:  Patient with metastatic lung cancer and malignant pleural effusion. Patient receiving chemotherapy.  Met with patient during infusion.  Patient reports that her appetite is good.  Denies any nutrition impact symptoms at this time.  Patient typically does not eat breakfast but drinks premier protein with medication.  Yesterday was able to eat hamburger, corn on cob, [place] and cheese sandwich, chicken wings.      Medications: reviewed  Labs: K 3.4  Anthropometrics:   Weight 149 lb today increased from 133 lb on 3/1 134 lb on 12/16   NUTRITION DIAGNOSIS: Inadequate oral intake improved.   INTERVENTION:  Patient to continue eating good sources of protein and well balanced diet.  Continue premier protein shake  Contact information given to patient and encouraged patient to call RD if appetite declines or weight drops.     NEXT VISIT: no follow-up RD available as needed  Marko Skalski B. [name], [place], [place] Registered Dietitian [phone] (mobile)

## 2021-04-12 NOTE — Progress Notes (Signed)
Hematology/Oncology Follow Up Note Brittney [facility]  [phone] [phone]  Patient Care Team: Ranae Plumber, [place] as PCP - General (Family Medicine) Telford Nab, RN as Oncology Nurse Navigator [name], MD as Medical Oncologist (Oncology)   Name of the patient: Brittney Fisher  [id number]  [birth year]   REASON FOR VISIT  follow-up for lung cancer  PERTINENT ONCOLOGY HISTORY 60 y.o. female with past medical history including GERD, COPD, current everyday smoker presents for follow-up of lung mass and pleural effusion. 10/20/2020, patient was brought to ED via EMS due to generalized weakness, dizziness, chest pain shortness of breath.  She was recently diagnosed with COPD approximately 1 month ago by primary care provider.  Also unintentional weight loss during the last few months. Image work-up showed right-sided pleural effusion.  She underwent right thoracentesis.  With removal of 2.4 L of fluid. 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  Metastatic disease to low cervical/thoracic nodes, lung, right pleural space and bones.  Moderate to large right pleural effusion.  SVC narrowing. 10/21/2020 MRI brain is negative for metastasis.  Mild chronic microvascular ischemic changes in the white matter.  Negative for acute infarct Regarding to the SVC narrowing, patient was seen by vascular surgeon and was recommended no intervention inpatient.  Patient to follow-up outpatient with vascular surgeon for evaluation. Patient also was treated for COPD exacerbation with hypoxia on exertion. Qualifies for home oxygen and hospitalist arrange home health and a nebulizer.  Patient was given a course of prednisone taper and empiric Levaquin. Patient was discharged and present today to follow-up with cytology results and further management plan  10/27/2020 PET scan showed right  upper lobe primary bronchogenic mass with direct invasion into mediastinum. Metastatic disease to the right pleural space, lung, bone, nodes of the chest and less so lower leg/upper abdomen. Hypermetabolic him along the course of the right axillary vein with concurrent subtle hyper attenuation within the axillary vein and SVC.  This continues to the level of SVC narrowing.  Suspicious for SVC occlusion and developing thrombus. Moderate right pleural effusion and small pericardial effusion.  # # left supraclavicular mass biopsy- pathology is positive for adenocarcinoma. positive for CK7, with patchy, weak CK20. They are  negative for TTF1, NapsinA, GATA3, CDX2, and Pax8. The findings are  nonspecific; but may be compatible with a poorly differentiated  adenocarcinoma of lung origin, especially given the imaging findings  She also had another repeat thoracentesis and fluid cytology showed malignancy.  #NGS showed AXIN 1 [id number], DIS2 M152f, KRAS G12C, PRDM1 M1 [id number], [id number] MS stable, TPS <1%   11/14/2020- 12/12/2020.  #Bronchial obstruction and SVC occlusion.  s/p palliative radiation #After 2 cycles of chemtherapy, patient had CT scan done due to shortness of breath.  01/05/2021, CT chest angiogram PE protocol showed no evidence of pulmonary embolism.  Stable disease.  Right upper lobe mass about 6.7 x 5.3 cm.  Widespread metastatic disease, lung and bone involvement. Interval development of right-sided hydronephrosis. 03/06/2021 finished T2 palliative radiation 03/22/2021, 6 cycles of carboplatin/Alimta/bevacizumab.  Infusion reaction of carboplatin during cycle 6.  Carboplatin discontinued. 04/07/2021 CT chest abdomen pelvis-partial response Mild decrease in size of right lung mass, mildly decreased mediastinal and hilar adenopathy.  Extensive right-sided pleural metastasis unchanged.  Similar appearance of diffuse bilateral pulmonary nodules.  Multifocal lytic sclerotic bone  metastasis.  New superior endplate deformity of T6  INTERVAL HISTORY Brittney HIGHAMis  a 60 y.o. female who has above history reviewed by me today presents for follow up visit for management of metastatic lung cancer, acute visit for swallowing pain.  Problems and complaints are listed below Patient has no new complaints today. Anxious about her CT results.  Appetite is fair and she has gained weight.   Review of Systems  Constitutional: Negative for chills, fatigue, fever and unexpected weight change.  HENT:   Negative for hearing loss and voice change.   Eyes: Negative for eye problems.  Respiratory: Negative for chest tightness, cough and shortness of breath.   Cardiovascular: Negative for chest pain.  Gastrointestinal: Negative for abdominal distention, abdominal pain and blood in stool.  Endocrine: Negative for hot flashes.  Genitourinary: Negative for difficulty urinating and frequency.   Musculoskeletal: Negative for arthralgias.  Skin: Negative for itching and rash.  Neurological: Negative for extremity weakness.  Hematological: Negative for adenopathy.  Psychiatric/Behavioral: Negative for confusion.      Allergies  Allergen Reactions  . Carboplatin Shortness Of Breath, Itching and Cough     Past Medical History:  Diagnosis Date  . COPD (chronic obstructive pulmonary disease) ([place])   . GERD (gastroesophageal reflux disease)   . Metastatic lung cancer (metastasis from lung to other site) [facility]) 11/02/2020     Past Surgical History:  Procedure Laterality Date  . COLONOSCOPY WITH ESOPHAGOGASTRODUODENOSCOPY (EGD)    . COLONOSCOPY WITH PROPOFOL N/A 11/14/2015   Procedure: COLONOSCOPY WITH PROPOFOL;  Surgeon: Hulen Luster, MD;  Location: [facility];  Service: Gastroenterology;  Laterality: N/A;  . ESOPHAGOGASTRODUODENOSCOPY (EGD) WITH PROPOFOL N/A 11/14/2015   Procedure: ESOPHAGOGASTRODUODENOSCOPY (EGD) WITH PROPOFOL;  Surgeon: Hulen Luster, MD;  Location: [facility]  END[facility]COPY;  Service: Gastroenterology;  Laterality: N/A;    Social History   Socioeconomic History  . Marital status: Married    Spouse name: Not on file  . Number of children: Not on file  . Years of education: Not on file  . Highest education level: Not on file  Occupational History  . Not on file  Tobacco Use  . Smoking status: Current Every Day Smoker    Packs/day: 0.20    Types: Cigarettes  . Smokeless tobacco: Never Used  Substance and Sexual Activity  . Alcohol use: Never  . Drug use: Never  . Sexual activity: Not Currently  Other Topics Concern  . Not on file  Social History Narrative  . Not on file   Social Determinants of Health   Financial Resource Strain: Not on file  Food Insecurity: Not on file  Transportation Needs: Not on file  Physical Activity: Not on file  Stress: Not on file  Social Connections: Not on file  Intimate Partner Violence: Not on file    Family History  Problem Relation Age of Onset  . Breast cancer Cousin 33  . Diabetes type II Mother   . Hypertension Mother   . Colon cancer Father   . Hypertension Father      Current Outpatient Medications:  .  acetaminophen (TYLENOL) 500 MG tablet, Take 1,000 mg by mouth every 8 (eight) hours as needed for moderate pain. , Disp: , Rfl:  .  benzonatate (TESSALON) 100 MG capsule, Take 1 capsule (100 mg total) by mouth 3 (three) times daily as needed for cough., Disp: 60 capsule, Rfl: 1 .  dexamethasone (DECADRON) 4 MG tablet, Take 1 tab (4 mg) twice a day the day before Alimta chemo. Take 2 tablets (8 mg) po daily x 3  days starting the day after chemo., Disp: 30 tablet, Rfl: 1 .  folic acid (FOLVITE) 1 MG tablet, Take 1 tablet (1 mg total) by mouth daily. Start 5-7 days before Alimta chemotherapy. Continue until 21 days after Alimta completed., Disp: 100 tablet, Rfl: 3 .  guaiFENesin (MUCINEX) 600 MG 12 hr tablet, Take 1 tablet (600 mg total) by mouth 2 (two) times daily., Disp: 14 tablet, Rfl:  0 .  ipratropium-albuterol (DUONEB) 0.5-2.5 (3) MG/3ML SOLN, Take 3 mLs by nebulization 3 (three) times daily., Disp: 360 mL, Rfl: 1 .  magic mouthwash w/lidocaine SOLN, Take 5 mLs by mouth 4 (four) times daily as needed for mouth pain. Sig: Swish/Swallow 5-10 ml four times a day as needed. Dispense 480 ml. 1RF, Disp: 480 mL, Rfl: 1 .  omeprazole (PRILOSEC) 20 MG capsule, Take 1 capsule (20 mg total) by mouth 2 (two) times daily before a meal., Disp: 60 capsule, Rfl: 1 .  oxyCODONE (OXY IR/ROXICODONE) 5 MG immediate release tablet, Take 1 tablet (5 mg total) by mouth every 6 (six) hours as needed for moderate pain or severe pain., Disp: 60 tablet, Rfl: 0 .  Polyethylene Glycol 3350 (MIRALAX PO), Take by mouth daily as needed., Disp: , Rfl:  .  protein supplement shake (PREMIER PROTEIN) LIQD, Take 2 oz by mouth 4 (four) times daily., Disp: , Rfl:  .  senna-docusate (SENOKOT-S) 8.6-50 MG tablet, Take 2 tablets by mouth daily., Disp: 60 tablet, Rfl: 3 .  sucralfate (CARAFATE) 1 GM/10ML suspension, Take 10 mLs (1 g total) by mouth in the morning, at noon, and at bedtime., Disp: 420 mL, Rfl: 0 .  ondansetron (ZOFRAN) 8 MG tablet, Take 1 tablet (8 mg total) by mouth 2 (two) times daily as needed for refractory nausea / vomiting. Start on day 3 after chemo. (Patient not taking: No sig reported), Disp: 30 tablet, Rfl: 1 .  prochlorperazine (COMPAZINE) 10 MG tablet, Take 1 tablet (10 mg total) by mouth every 6 (six) hours as needed (Nausea or vomiting). (Patient not taking: No sig reported), Disp: 30 tablet, Rfl: 1 .  tiotropium (SPIRIVA HANDIHALER) 18 MCG inhalation capsule, Place 18 mcg into inhaler and inhale daily. (Patient not taking: No sig reported), Disp: , Rfl:   Physical exam: ECOG 1 Vitals:   04/12/21 0838  BP: 127/78  Pulse: (!) 102  Resp: 18  Temp: 98.2 F (36.8 C)  Weight: 149 lb (67.6 kg)   Physical Exam Constitutional:      General: She is not in acute distress.    Comments: Thin  built female walks independently  HENT:     Head: Normocephalic and atraumatic.  Eyes:     General: No scleral icterus. Cardiovascular:     Rate and Rhythm: Normal rate and regular rhythm.     Heart sounds: Normal heart sounds.  Pulmonary:     Effort: Pulmonary effort is normal. No respiratory distress.     Breath sounds: No wheezing.     Comments: Slightly decreased breath sound bilaterally.  Improved Abdominal:     General: Bowel sounds are normal. There is no distension.     Palpations: Abdomen is soft.  Musculoskeletal:        General: No deformity. Normal range of motion.     Cervical back: Normal range of motion and neck supple.  Skin:    General: Skin is warm and dry.     Findings: No erythema or rash.  Neurological:     Mental Status: She  is alert and oriented to person, place, and time. Mental status is at baseline.     Cranial Nerves: No cranial nerve deficit.     Coordination: Coordination normal.  Psychiatric:        Mood and Affect: Mood normal.     CMP Latest Ref Rng & Units 04/12/2021  Glucose 70 - 99 mg/dL 93  BUN 8 - 23 mg/dL 21  Creatinine 0.44 - 1.00 mg/dL 0.73  Sodium 135 - 145 mmol/L 140  Potassium 3.5 - 5.1 mmol/L 3.4(L)  Chloride 98 - 111 mmol/L 105  CO2 22 - 32 mmol/L 26  Calcium 8.9 - 10.3 mg/dL 9.1  Total Protein 6.5 - 8.1 g/dL 7.5  Total Bilirubin 0.3 - 1.2 mg/dL 0.4  Alkaline Phos 38 - 126 U/L 67  AST 15 - 41 U/L 21  ALT 0 - 44 U/L 14   CBC Latest Ref Rng & Units 04/12/2021  WBC 4.0 - 10.5 K/uL 5.7  Hemoglobin 12.0 - 15.0 g/dL 11.6(L)  Hematocrit 36.0 - 46.0 % 35.9(L)  Platelets 150 - 400 K/uL 205    RADIOGRAPHIC STUDIES: I have personally reviewed the radiological images as listed and agreed with the findings in the report. CT CHEST ABDOMEN PELVIS W CONTRAST  Result Date: 04/08/2021 CLINICAL DATA:  Restaging lung cancer. EXAM: CT CHEST, ABDOMEN, AND PELVIS WITH CONTRAST TECHNIQUE: Multidetector CT imaging of the chest, abdomen and  pelvis was performed following the standard protocol during bolus administration of intravenous contrast. CONTRAST:  27mL OMNIPAQUE IOHEXOL 300 MG/ML  SOLN COMPARISON:  CT angio chest 01/05/2021 and CT AP 01/20/2021 FINDINGS: CT CHEST FINDINGS Cardiovascular: Heart size is within normal limits. No pericardial effusion identified. Aortic atherosclerosis. Mediastinum/Nodes: Extensive thoracic adenopathy. Right paratracheal lymph node measures 2.1 cm, image 21/2. Previously 2.3 cm. Low right paratracheal lymph node measures 1.8 cm, image 27/2. Previously 1.9 cm. Left pre vascular lymph node measures 1.3 cm, image 27/2. Previously 1.5 cm. Subcarinal node measures 1.9 cm, image 30/2.  Previously 2.4 cm. Right hilar node measures 1.4 cm, image 32/2.  Previously 1.6 cm. Left hilar node measures 1.4 cm, image 39/2.  Previously 1.8 cm. Lungs/Pleura: Rind of tumor encasing the right lung is again noted compatible with metastatic disease to the pleura. Interval re-expansion of the right upper lobe with decreased partial atelectasis of the right middle lobe. As on the previous exam the margins of the right lung mass are difficult to delineate. Best estimate this currently measures 8.2 x 4.8 cm, image 27/2. Previously 8.4 x 6.1 cm multiple pulmonary nodules are scattered throughout both lungs compatible with metastatic disease. These appear similar in size, multiplicity and distribution. Musculoskeletal: Mixed lytic and sclerotic bone lesions are again noted within the visualized axial and proximal appendicular skeleton. These appear similar to the previous exam. Superior endplate deformity involving the T6 vertebra is new from 01/05/2021. CT ABDOMEN PELVIS FINDINGS Hepatobiliary: No focal liver abnormality is seen. No gallstones, gallbladder wall thickening, or biliary dilatation. Pancreas: Unremarkable. No pancreatic ductal dilatation or surrounding inflammatory changes. Spleen: Normal in size without focal abnormality.  Adrenals/Urinary Tract: Right-sided parapelvic cyst scratch set normal adrenal glands. Right-sided parapelvic cysts. Upper pole left kidney cyst measures 1.8 cm. No hydronephrosis identified bilaterally. Bladder appears collapsed. Stomach/Bowel: Stomach is nondistended. The appendix is visualized and appears normal. No bowel wall thickening, inflammation or distension. Vascular/Lymphatic: Aortic atherosclerosis. No aneurysm. No abdominopelvic adenopathy. Reproductive: Uterus and bilateral adnexa are unremarkable. Other: No ascites or discrete fluid collection. Musculoskeletal: Multifocal lytic and  sclerotic bone metastases are again noted. These appears stable when compared with the previous study. IMPRESSION: 1. Mild decrease in size of right lung mass with interval re-expansion of the right upper lobe with decreased subsegmental atelectasis of the right middle lobe. 2. Mediastinal and hilar adenopathy is mildly improved in the interval. 3. Extensive right-sided pleural metastasis, not significantly changed. 4. Similar appearance of diffuse bilateral pulmonary nodules compatible with metastatic disease. 5. Stable appearance of multifocal lytic and sclerotic bone metastases. 6. New mild superior endplate deformity is identified at T6. 7. Aortic atherosclerosis. Aortic Atherosclerosis (ICD10-I70.0). Electronically Signed   By: Kerby Moors M.D.   On: 04/08/2021 11:55     Assessment and plan  1. Primary malignant neoplasm of lung metastatic to other site, unspecified laterality ([place])   2. Encounter for antineoplastic chemotherapy   3. Bone metastasis ([place])   4. Neoplasm related pain   5. 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  cT4  N3 M1-  KRASG12C (Lumakras in subsequent line can be considered) First-line treatment with carboplatin/Alimta/bevacizumab. Cycle 6 -carboplatin infusion reaction-discontinue carboplatin. CT was independently reviewed by me and discussed with patient Partial response. Discussed with the patient that I recommend patient to go on Alimta and bevacizumab maintenance. Continue CT surveillance every 3 months She agrees with the plan.  #Bone metastasis.  Status post T2 radiation.  Patient reports right lower back pain which is slightly worse.  Advised patient to utilize pain medication, Tylenol and or ibuprofen.  If pain is not relieved, consider palliative radiation. Patient declined bisphosphonate  #Neoplasm related pain/treatment complication-pain appears to be stable. Continue oxycodone 5 mg every 4 hours as needed.   #Anemia, likely secondary to chemotherapy radiation and underlying malignancy.  11.6 #Mild hypokalemia, potassium 3.4, patient prefers to try potassium enriched food first.  Monitor potassium level at the next visit.   Lab MD Alimta, bevacizumab, 3 weeks.  [name], MD, PhD Hematology Oncology [facility] Pager- [phone] 04/12/2021

## 2021-04-12 NOTE — Patient Instructions (Signed)
[facility]    Discharge Instructions: Thank you for choosing [facility] to provide your oncology and hematology care.  If you have a lab appointment with the [facility], please go directly to the [facility] and check in at the registration area.  Wear comfortable clothing and clothing appropriate for easy access to any Portacath or PICC line.   We strive to give you quality time with your provider. You may need to reschedule your appointment if you arrive late (15 or more minutes).  Arriving late affects you and other patients whose appointments are after yours.  Also, if you miss three or more appointments without notifying the office, you may be dismissed from the clinic at the provider's discretion.      For prescription refill requests, have your pharmacy contact our office and allow 72 hours for refills to be completed.    Today you received the following chemotherapy and/or immunotherapy agents Bevacizumab Noah Charon), Pemetrexed (alimta)     To help prevent nausea and vomiting after your treatment, we encourage you to take your nausea medication as directed.  BELOW ARE SYMPTOMS THAT SHOULD BE REPORTED IMMEDIATELY: . *FEVER GREATER THAN 100.4 F (38 C) OR HIGHER . *CHILLS OR SWEATING . *NAUSEA AND VOMITING THAT IS NOT CONTROLLED WITH YOUR NAUSEA MEDICATION . *UNUSUAL SHORTNESS OF BREATH . *UNUSUAL BRUISING OR BLEEDING . *URINARY PROBLEMS (pain or burning when urinating, or frequent urination) . *BOWEL PROBLEMS (unusual diarrhea, constipation, pain near the anus) . TENDERNESS IN MOUTH AND THROAT WITH OR WITHOUT PRESENCE OF ULCERS (sore throat, sores in mouth, or a toothache) . UNUSUAL RASH, SWELLING OR PAIN  . UNUSUAL VAGINAL DISCHARGE OR ITCHING   Items with * indicate a potential emergency and should be followed up as soon as possible or go to the Emergency Department if any problems should occur.  Please show the  CHEMOTHERAPY ALERT CARD or IMMUNOTHERAPY ALERT CARD at check-in to the Emergency Department and triage nurse.  Should you have questions after your visit or need to cancel or reschedule your appointment, please contact [facility]  [phone] and follow the prompts.  Office hours are 8:00 a.m. to 4:30 p.m. Monday - Friday. Please note that voicemails left after 4:00 p.m. may not be returned until the following business day.  We are closed weekends and major holidays. You have access to a nurse at all times for urgent questions. Please call the main number to the clinic [phone] and follow the prompts.  For any non-urgent questions, you may also contact your provider using MyChart. We now offer e-Visits for anyone 5 and older to request care online for non-urgent symptoms. For details visit [email or web address].   Also download the MyChart app! Go to the app store, search "MyChart", open the app, select [facility], and log in with your MyChart username and password.  Due to Covid, a mask is required upon entering the hospital/clinic. If you do not have a mask, one will be given to you upon arrival. For doctor visits, patients may have 1 support person aged 90 or older with them. For treatment visits, patients cannot have anyone with them due to current Covid guidelines and our immunocompromised population.   Bevacizumab injection What is this medicine? BEVACIZUMAB (be va SIZ yoo mab) is a monoclonal antibody. It is used to treat many types of cancer. This medicine may be used for other purposes; ask your health care provider or pharmacist if  you have questions. COMMON BRAND NAME(S): Avastin, MVASI, Zirabev What should I tell my health care provider before I take this medicine? They need to know if you have any of these conditions:  diabetes  heart disease  high blood pressure  history of coughing up blood  prior anthracycline chemotherapy (e.g.,  doxorubicin, daunorubicin, epirubicin)  recent or ongoing radiation therapy  recent or planning to have surgery  stroke  an unusual or allergic reaction to bevacizumab, hamster proteins, mouse proteins, other medicines, foods, dyes, or preservatives  pregnant or trying to get pregnant  breast-feeding How should I use this medicine? This medicine is for infusion into a vein. It is given by a health care professional in a hospital or clinic setting. Talk to your pediatrician regarding the use of this medicine in children. Special care may be needed. Overdosage: If you think you have taken too much of this medicine contact a poison control center or emergency room at once. NOTE: This medicine is only for you. Do not share this medicine with others. What if I miss a dose? It is important not to miss your dose. Call your doctor or health care professional if you are unable to keep an appointment. What may interact with this medicine? Interactions are not expected. This list may not describe all possible interactions. Give your health care provider a list of all the medicines, herbs, non-prescription drugs, or dietary supplements you use. Also tell them if you smoke, drink alcohol, or use illegal drugs. Some items may interact with your medicine. What should I watch for while using this medicine? Your condition will be monitored carefully while you are receiving this medicine. You will need important blood work and urine testing done while you are taking this medicine. This medicine may increase your risk to bruise or bleed. Call your doctor or health care professional if you notice any unusual bleeding. Before having surgery, talk to your health care provider to make sure it is ok. This drug can increase the risk of poor healing of your surgical site or wound. You will need to stop this drug for 28 days before surgery. After surgery, wait at least 28 days before restarting this drug. Make sure  the surgical site or wound is healed enough before restarting this drug. Talk to your health care provider if questions. Do not become pregnant while taking this medicine or for 6 months after stopping it. Women should inform their doctor if they wish to become pregnant or think they might be pregnant. There is a potential for serious side effects to an unborn child. Talk to your health care professional or pharmacist for more information. Do not breast-feed an infant while taking this medicine and for 6 months after the last dose. This medicine has caused ovarian failure in some women. This medicine may interfere with the ability to have a child. You should talk to your doctor or health care professional if you are concerned about your fertility. What side effects may I notice from receiving this medicine? Side effects that you should report to your doctor or health care professional as soon as possible:  allergic reactions like skin rash, itching or hives, swelling of the face, lips, or tongue  chest pain or chest tightness  chills  coughing up blood  high fever  seizures  severe constipation  signs and symptoms of bleeding such as bloody or black, tarry stools; red or dark-brown urine; spitting up blood or brown material that looks like coffee  grounds; red spots on the skin; unusual bruising or bleeding from the eye, gums, or nose  signs and symptoms of a blood clot such as breathing problems; chest pain; severe, sudden headache; pain, swelling, warmth in the leg  signs and symptoms of a stroke like changes in vision; confusion; trouble speaking or understanding; severe headaches; sudden numbness or weakness of the face, arm or leg; trouble walking; dizziness; loss of balance or coordination  stomach pain  sweating  swelling of legs or ankles  vomiting  weight gain Side effects that usually do not require medical attention (report to your doctor or health care professional if  they continue or are bothersome):  back pain  changes in taste  decreased appetite  dry skin  nausea  tiredness This list may not describe all possible side effects. Call your doctor for medical advice about side effects. You may report side effects to FDA at 1-800-FDA-1088. Where should I keep my medicine? This drug is given in a hospital or clinic and will not be stored at home. NOTE: This sheet is a summary. It may not cover all possible information. If you have questions about this medicine, talk to your doctor, pharmacist, or health care provider.  2021 Elsevier/Gold Standard (2019-09-30 10:50:46)  Pemetrexed injection What is this medicine? PEMETREXED (PEM e TREX ed) is a chemotherapy drug used to treat lung cancers like non-small cell lung cancer and mesothelioma. It may also be used to treat other cancers. This medicine may be used for other purposes; ask your health care provider or pharmacist if you have questions. COMMON BRAND NAME(S): Alimta What should I tell my health care provider before I take this medicine? They need to know if you have any of these conditions:  infection (especially a virus infection such as chickenpox, cold sores, or herpes)  kidney disease  low blood counts, like low white cell, platelet, or red cell counts  lung or breathing disease, like asthma  radiation therapy  an unusual or allergic reaction to pemetrexed, other medicines, foods, dyes, or preservative  pregnant or trying to get pregnant  breast-feeding How should I use this medicine? This drug is given as an infusion into a vein. It is administered in a hospital or clinic by a specially trained health care professional. Talk to your pediatrician regarding the use of this medicine in children. Special care may be needed. Overdosage: If you think you have taken too much of this medicine contact a poison control center or emergency room at once. NOTE: This medicine is only for  you. Do not share this medicine with others. What if I miss a dose? It is important not to miss your dose. Call your doctor or health care professional if you are unable to keep an appointment. What may interact with this medicine? This medicine may interact with the following medications:  Ibuprofen This list may not describe all possible interactions. Give your health care provider a list of all the medicines, herbs, non-prescription drugs, or dietary supplements you use. Also tell them if you smoke, drink alcohol, or use illegal drugs. Some items may interact with your medicine. What should I watch for while using this medicine? Visit your doctor for checks on your progress. This drug may make you feel generally unwell. This is not uncommon, as chemotherapy can affect healthy cells as well as cancer cells. Report any side effects. Continue your course of treatment even though you feel ill unless your doctor tells you to stop. In  some cases, you may be given additional medicines to help with side effects. Follow all directions for their use. Call your doctor or health care professional for advice if you get a fever, chills or sore throat, or other symptoms of a cold or flu. Do not treat yourself. This drug decreases your body's ability to fight infections. Try to avoid being around people who are sick. This medicine may increase your risk to bruise or bleed. Call your doctor or health care professional if you notice any unusual bleeding. Be careful brushing and flossing your teeth or using a toothpick because you may get an infection or bleed more easily. If you have any dental work done, tell your dentist you are receiving this medicine. Avoid taking products that contain aspirin, acetaminophen, ibuprofen, naproxen, or ketoprofen unless instructed by your doctor. These medicines may hide a fever. Call your doctor or health care professional if you get diarrhea or mouth sores. Do not treat  yourself. To protect your kidneys, drink water or other fluids as directed while you are taking this medicine. Do not become pregnant while taking this medicine or for 6 months after stopping it. Women should inform their doctor if they wish to become pregnant or think they might be pregnant. Men should not father a child while taking this medicine and for 3 months after stopping it. This may interfere with the ability to father a child. You should talk to your doctor or health care professional if you are concerned about your fertility. There is a potential for serious side effects to an unborn child. Talk to your health care professional or pharmacist for more information. Do not breast-feed an infant while taking this medicine or for 1 week after stopping it. What side effects may I notice from receiving this medicine? Side effects that you should report to your doctor or health care professional as soon as possible:  allergic reactions like skin rash, itching or hives, swelling of the face, lips, or tongue  breathing problems  redness, blistering, peeling or loosening of the skin, including inside the mouth  signs and symptoms of bleeding such as bloody or black, tarry stools; red or dark-brown urine; spitting up blood or brown material that looks like coffee grounds; red spots on the skin; unusual bruising or bleeding from the eye, gums, or nose  signs and symptoms of infection like fever or chills; cough; sore throat; pain or trouble passing urine  signs and symptoms of kidney injury like trouble passing urine or change in the amount of urine  signs and symptoms of liver injury like dark yellow or brown urine; general ill feeling or flu-like symptoms; light-colored stools; loss of appetite; nausea; right upper belly pain; unusually weak or tired; yellowing of the eyes or skin Side effects that usually do not require medical attention (report to your doctor or health care professional if they  continue or are bothersome):  constipation  mouth sores  nausea, vomiting  unusually weak or tired This list may not describe all possible side effects. Call your doctor for medical advice about side effects. You may report side effects to FDA at 1-800-FDA-1088. Where should I keep my medicine? This drug is given in a hospital or clinic and will not be stored at home. NOTE: This sheet is a summary. It may not cover all possible information. If you have questions about this medicine, talk to your doctor, pharmacist, or health care provider.  2021 Elsevier/Gold Standard (2018-01-22 16:11:33)

## 2021-04-24 ENCOUNTER — Other Ambulatory Visit: Payer: Self-pay | Admitting: *Deleted

## 2021-04-24 MED ORDER — OXYCODONE HCL 5 MG PO TABS: 5.0000 mg | ORAL_TABLET | Freq: Four times a day (QID) | ORAL | 0 refills | Status: DC | PRN

## 2021-05-03 ENCOUNTER — Inpatient Hospital Stay: Payer: BC Managed Care – PPO

## 2021-05-03 ENCOUNTER — Inpatient Hospital Stay: Payer: BC Managed Care – PPO | Attending: Oncology | Admitting: Oncology

## 2021-05-03 ENCOUNTER — Encounter: Payer: Self-pay | Admitting: Oncology

## 2021-05-03 ENCOUNTER — Telehealth: Payer: Self-pay

## 2021-05-03 VITALS — BP 121/79 | HR 101 | Temp 97.7°F | Resp 18 | Wt 152.7 lb

## 2021-05-03 VITALS — HR 97

## 2021-05-03 DIAGNOSIS — G893 Neoplasm related pain (acute) (chronic): Secondary | ICD-10-CM | POA: Diagnosis not present

## 2021-05-03 DIAGNOSIS — C3490 Malignant neoplasm of unspecified part of unspecified bronchus or lung: Secondary | ICD-10-CM | POA: Insufficient documentation

## 2021-05-03 DIAGNOSIS — E876 Hypokalemia: Secondary | ICD-10-CM | POA: Insufficient documentation

## 2021-05-03 DIAGNOSIS — D630 Anemia in neoplastic disease: Secondary | ICD-10-CM | POA: Diagnosis not present

## 2021-05-03 DIAGNOSIS — Z5112 Encounter for antineoplastic immunotherapy: Secondary | ICD-10-CM | POA: Insufficient documentation

## 2021-05-03 DIAGNOSIS — Z5111 Encounter for antineoplastic chemotherapy: Secondary | ICD-10-CM | POA: Insufficient documentation

## 2021-05-03 DIAGNOSIS — G35 Multiple sclerosis: Secondary | ICD-10-CM | POA: Insufficient documentation

## 2021-05-03 DIAGNOSIS — K219 Gastro-esophageal reflux disease without esophagitis: Secondary | ICD-10-CM | POA: Insufficient documentation

## 2021-05-03 DIAGNOSIS — Z803 Family history of malignant neoplasm of breast: Secondary | ICD-10-CM | POA: Diagnosis not present

## 2021-05-03 DIAGNOSIS — J449 Chronic obstructive pulmonary disease, unspecified: Secondary | ICD-10-CM | POA: Insufficient documentation

## 2021-05-03 DIAGNOSIS — F17210 Nicotine dependence, cigarettes, uncomplicated: Secondary | ICD-10-CM | POA: Insufficient documentation

## 2021-05-03 DIAGNOSIS — C7951 Secondary malignant neoplasm of bone: Secondary | ICD-10-CM | POA: Insufficient documentation

## 2021-05-03 DIAGNOSIS — J90 Pleural effusion, not elsewhere classified: Secondary | ICD-10-CM | POA: Insufficient documentation

## 2021-05-03 DIAGNOSIS — R2689 Other abnormalities of gait and mobility: Secondary | ICD-10-CM | POA: Diagnosis not present

## 2021-05-03 DIAGNOSIS — Z800 Family history of malignant neoplasm of digestive organs: Secondary | ICD-10-CM | POA: Insufficient documentation

## 2021-05-03 LAB — CBC WITH DIFFERENTIAL/PLATELET
Abs Immature Granulocytes: 0.05 10*3/uL (ref 0.00–0.07)
Basophils Absolute: 0 10*3/uL (ref 0.0–0.1)
Basophils Relative: 0 %
Eosinophils Absolute: 0 10*3/uL (ref 0.0–0.5)
Eosinophils Relative: 0 %
HCT: 37.5 % (ref 36.0–46.0)
Hemoglobin: 12.1 g/dL (ref 12.0–15.0)
Immature Granulocytes: 1 %
Lymphocytes Relative: 11 %
Lymphs Abs: 0.8 10*3/uL (ref 0.7–4.0)
MCH: 28.5 pg (ref 26.0–34.0)
MCHC: 32.3 g/dL (ref 30.0–36.0)
MCV: 88.2 fL (ref 80.0–100.0)
Monocytes Absolute: 0.8 10*3/uL (ref 0.1–1.0)
Monocytes Relative: 11 %
Neutro Abs: 5.8 10*3/uL (ref 1.7–7.7)
Neutrophils Relative %: 77 %
Platelets: 212 10*3/uL (ref 150–400)
RBC: 4.25 MIL/uL (ref 3.87–5.11)
RDW: 16.8 % — ABNORMAL HIGH (ref 11.5–15.5)
WBC: 7.5 10*3/uL (ref 4.0–10.5)
nRBC: 0 % (ref 0.0–0.2)

## 2021-05-03 LAB — COMPREHENSIVE METABOLIC PANEL
ALT: 20 U/L (ref 0–44)
AST: 25 U/L (ref 15–41)
Albumin: 3.3 g/dL — ABNORMAL LOW (ref 3.5–5.0)
Alkaline Phosphatase: 75 U/L (ref 38–126)
Anion gap: 8 (ref 5–15)
BUN: 17 mg/dL (ref 8–23)
CO2: 25 mmol/L (ref 22–32)
Calcium: 9 mg/dL (ref 8.9–10.3)
Chloride: 102 mmol/L (ref 98–111)
Creatinine, Ser: 0.64 mg/dL (ref 0.44–1.00)
GFR, Estimated: >60 mL/min (ref >60)
Glucose, Bld: 84 mg/dL (ref 70–99)
Potassium: 3.4 mmol/L — ABNORMAL LOW (ref 3.5–5.1)
Sodium: 135 mmol/L (ref 135–145)
Total Bilirubin: 0.4 mg/dL (ref 0.3–1.2)
Total Protein: 7.7 g/dL (ref 6.5–8.1)

## 2021-05-03 LAB — PROTEIN, URINE, RANDOM: Total Protein, Urine: 7 mg/dL

## 2021-05-03 MED ORDER — SODIUM CHLORIDE 0.9 % IV SOLN
500.0000 mg/m2 | Freq: Once | INTRAVENOUS | Status: AC
  Administered 2021-05-03: 900 mg via INTRAVENOUS
  Filled 2021-05-03: qty 20

## 2021-05-03 MED ORDER — ALPRAZOLAM 0.5 MG PO TABS: 0.5000 mg | ORAL_TABLET | ORAL | 0 refills | Status: DC

## 2021-05-03 MED ORDER — SODIUM CHLORIDE 0.9 % IV SOLN
1000.0000 mg | Freq: Once | INTRAVENOUS | Status: AC
  Administered 2021-05-03: 1000 mg via INTRAVENOUS
  Filled 2021-05-03: qty 32

## 2021-05-03 MED ORDER — PROCHLORPERAZINE MALEATE 10 MG PO TABS
10.0000 mg | ORAL_TABLET | Freq: Once | ORAL | Status: AC
  Administered 2021-05-03: 10 mg via ORAL
  Filled 2021-05-03: qty 1

## 2021-05-03 MED ORDER — SODIUM CHLORIDE 0.9 % IV SOLN
Freq: Once | INTRAVENOUS | Status: AC
Start: 2021-05-03 — End: 2021-05-03
  Filled 2021-05-03: qty 250

## 2021-05-03 MED ORDER — SODIUM CHLORIDE 0.9% FLUSH
10.0000 mL | INTRAVENOUS | Status: DC | PRN
  Filled 2021-05-03: qty 10

## 2021-05-03 NOTE — Telephone Encounter (Signed)
[place] STD and leave forms completed and faxed to [phone].

## 2021-05-03 NOTE — Patient Instructions (Signed)
[facility]  Discharge Instructions: Thank you for choosing [facility] to provide your oncology and hematology care.  If you have a lab appointment with the [facility], please go directly to the [facility] and check in at the registration area.  Wear comfortable clothing and clothing appropriate for easy access to any Portacath or PICC line.   We strive to give you quality time with your provider. You may need to reschedule your appointment if you arrive late (15 or more minutes).  Arriving late affects you and other patients whose appointments are after yours.  Also, if you miss three or more appointments without notifying the office, you may be dismissed from the clinic at the provider's discretion.      For prescription refill requests, have your pharmacy contact our office and allow 72 hours for refills to be completed.    Today you received the following chemotherapy and/or immunotherapy agents alimta and avastin      To help prevent nausea and vomiting after your treatment, we encourage you to take your nausea medication as directed.  BELOW ARE SYMPTOMS THAT SHOULD BE REPORTED IMMEDIATELY: . *FEVER GREATER THAN 100.4 F (38 C) OR HIGHER . *CHILLS OR SWEATING . *NAUSEA AND VOMITING THAT IS NOT CONTROLLED WITH YOUR NAUSEA MEDICATION . *UNUSUAL SHORTNESS OF BREATH . *UNUSUAL BRUISING OR BLEEDING . *URINARY PROBLEMS (pain or burning when urinating, or frequent urination) . *BOWEL PROBLEMS (unusual diarrhea, constipation, pain near the anus) . TENDERNESS IN MOUTH AND THROAT WITH OR WITHOUT PRESENCE OF ULCERS (sore throat, sores in mouth, or a toothache) . UNUSUAL RASH, SWELLING OR PAIN  . UNUSUAL VAGINAL DISCHARGE OR ITCHING   Items with * indicate a potential emergency and should be followed up as soon as possible or go to the Emergency Department if any problems should occur.  Please show the CHEMOTHERAPY ALERT CARD or  IMMUNOTHERAPY ALERT CARD at check-in to the Emergency Department and triage nurse.  Should you have questions after your visit or need to cancel or reschedule your appointment, please contact [facility]  [phone] and follow the prompts.  Office hours are 8:00 a.m. to 4:30 p.m. Monday - Friday. Please note that voicemails left after 4:00 p.m. may not be returned until the following business day.  We are closed weekends and major holidays. You have access to a nurse at all times for urgent questions. Please call the main number to the clinic [phone] and follow the prompts.  For any non-urgent questions, you may also contact your provider using MyChart. We now offer e-Visits for anyone 42 and older to request care online for non-urgent symptoms. For details visit [email or web address].   Also download the MyChart app! Go to the app store, search "MyChart", open the app, select [facility], and log in with your MyChart username and password.  Due to Covid, a mask is required upon entering the hospital/clinic. If you do not have a mask, one will be given to you upon arrival. For doctor visits, patients may have 1 support person aged 39 or older with them. For treatment visits, patients cannot have anyone with them due to current Covid guidelines and our immunocompromised population.   Pemetrexed injection What is this medicine? PEMETREXED (PEM e TREX ed) is a chemotherapy drug used to treat lung cancers like non-small cell lung cancer and mesothelioma. It may also be used to treat other cancers. This medicine may be used for other  purposes; ask your health care provider or pharmacist if you have questions. COMMON BRAND NAME(S): Alimta What should I tell my health care provider before I take this medicine? They need to know if you have any of these conditions:  infection (especially a virus infection such as chickenpox, cold sores, or herpes)  kidney  disease  low blood counts, like low white cell, platelet, or red cell counts  lung or breathing disease, like asthma  radiation therapy  an unusual or allergic reaction to pemetrexed, other medicines, foods, dyes, or preservative  pregnant or trying to get pregnant  breast-feeding How should I use this medicine? This drug is given as an infusion into a vein. It is administered in a hospital or clinic by a specially trained health care professional. Talk to your pediatrician regarding the use of this medicine in children. Special care may be needed. Overdosage: If you think you have taken too much of this medicine contact a poison control center or emergency room at once. NOTE: This medicine is only for you. Do not share this medicine with others. What if I miss a dose? It is important not to miss your dose. Call your doctor or health care professional if you are unable to keep an appointment. What may interact with this medicine? This medicine may interact with the following medications:  Ibuprofen This list may not describe all possible interactions. Give your health care provider a list of all the medicines, herbs, non-prescription drugs, or dietary supplements you use. Also tell them if you smoke, drink alcohol, or use illegal drugs. Some items may interact with your medicine. What should I watch for while using this medicine? Visit your doctor for checks on your progress. This drug may make you feel generally unwell. This is not uncommon, as chemotherapy can affect healthy cells as well as cancer cells. Report any side effects. Continue your course of treatment even though you feel ill unless your doctor tells you to stop. In some cases, you may be given additional medicines to help with side effects. Follow all directions for their use. Call your doctor or health care professional for advice if you get a fever, chills or sore throat, or other symptoms of a cold or flu. Do not treat  yourself. This drug decreases your body's ability to fight infections. Try to avoid being around people who are sick. This medicine may increase your risk to bruise or bleed. Call your doctor or health care professional if you notice any unusual bleeding. Be careful brushing and flossing your teeth or using a toothpick because you may get an infection or bleed more easily. If you have any dental work done, tell your dentist you are receiving this medicine. Avoid taking products that contain aspirin, acetaminophen, ibuprofen, naproxen, or ketoprofen unless instructed by your doctor. These medicines may hide a fever. Call your doctor or health care professional if you get diarrhea or mouth sores. Do not treat yourself. To protect your kidneys, drink water or other fluids as directed while you are taking this medicine. Do not become pregnant while taking this medicine or for 6 months after stopping it. Women should inform their doctor if they wish to become pregnant or think they might be pregnant. Men should not father a child while taking this medicine and for 3 months after stopping it. This may interfere with the ability to father a child. You should talk to your doctor or health care professional if you are concerned about your fertility.  There is a potential for serious side effects to an unborn child. Talk to your health care professional or pharmacist for more information. Do not breast-feed an infant while taking this medicine or for 1 week after stopping it. What side effects may I notice from receiving this medicine? Side effects that you should report to your doctor or health care professional as soon as possible:  allergic reactions like skin rash, itching or hives, swelling of the face, lips, or tongue  breathing problems  redness, blistering, peeling or loosening of the skin, including inside the mouth  signs and symptoms of bleeding such as bloody or black, tarry stools; red or  dark-brown urine; spitting up blood or brown material that looks like coffee grounds; red spots on the skin; unusual bruising or bleeding from the eye, gums, or nose  signs and symptoms of infection like fever or chills; cough; sore throat; pain or trouble passing urine  signs and symptoms of kidney injury like trouble passing urine or change in the amount of urine  signs and symptoms of liver injury like dark yellow or brown urine; general ill feeling or flu-like symptoms; light-colored stools; loss of appetite; nausea; right upper belly pain; unusually weak or tired; yellowing of the eyes or skin Side effects that usually do not require medical attention (report to your doctor or health care professional if they continue or are bothersome):  constipation  mouth sores  nausea, vomiting  unusually weak or tired This list may not describe all possible side effects. Call your doctor for medical advice about side effects. You may report side effects to FDA at 1-800-FDA-1088. Where should I keep my medicine? This drug is given in a hospital or clinic and will not be stored at home. NOTE: This sheet is a summary. It may not cover all possible information. If you have questions about this medicine, talk to your doctor, pharmacist, or health care provider.  2021 Elsevier/Gold Standard (2018-01-22 16:11:33)  Bevacizumab injection What is this medicine? BEVACIZUMAB (be va SIZ yoo mab) is a monoclonal antibody. It is used to treat many types of cancer. This medicine may be used for other purposes; ask your health care provider or pharmacist if you have questions. COMMON BRAND NAME(S): Avastin, MVASI, Zirabev What should I tell my health care provider before I take this medicine? They need to know if you have any of these conditions:  diabetes  heart disease  high blood pressure  history of coughing up blood  prior anthracycline chemotherapy (e.g., doxorubicin, daunorubicin,  epirubicin)  recent or ongoing radiation therapy  recent or planning to have surgery  stroke  an unusual or allergic reaction to bevacizumab, hamster proteins, mouse proteins, other medicines, foods, dyes, or preservatives  pregnant or trying to get pregnant  breast-feeding How should I use this medicine? This medicine is for infusion into a vein. It is given by a health care professional in a hospital or clinic setting. Talk to your pediatrician regarding the use of this medicine in children. Special care may be needed. Overdosage: If you think you have taken too much of this medicine contact a poison control center or emergency room at once. NOTE: This medicine is only for you. Do not share this medicine with others. What if I miss a dose? It is important not to miss your dose. Call your doctor or health care professional if you are unable to keep an appointment. What may interact with this medicine? Interactions are not expected. This list  may not describe all possible interactions. Give your health care provider a list of all the medicines, herbs, non-prescription drugs, or dietary supplements you use. Also tell them if you smoke, drink alcohol, or use illegal drugs. Some items may interact with your medicine. What should I watch for while using this medicine? Your condition will be monitored carefully while you are receiving this medicine. You will need important blood work and urine testing done while you are taking this medicine. This medicine may increase your risk to bruise or bleed. Call your doctor or health care professional if you notice any unusual bleeding. Before having surgery, talk to your health care provider to make sure it is ok. This drug can increase the risk of poor healing of your surgical site or wound. You will need to stop this drug for 28 days before surgery. After surgery, wait at least 28 days before restarting this drug. Make sure the surgical site or wound  is healed enough before restarting this drug. Talk to your health care provider if questions. Do not become pregnant while taking this medicine or for 6 months after stopping it. Women should inform their doctor if they wish to become pregnant or think they might be pregnant. There is a potential for serious side effects to an unborn child. Talk to your health care professional or pharmacist for more information. Do not breast-feed an infant while taking this medicine and for 6 months after the last dose. This medicine has caused ovarian failure in some women. This medicine may interfere with the ability to have a child. You should talk to your doctor or health care professional if you are concerned about your fertility. What side effects may I notice from receiving this medicine? Side effects that you should report to your doctor or health care professional as soon as possible:  allergic reactions like skin rash, itching or hives, swelling of the face, lips, or tongue  chest pain or chest tightness  chills  coughing up blood  high fever  seizures  severe constipation  signs and symptoms of bleeding such as bloody or black, tarry stools; red or dark-brown urine; spitting up blood or brown material that looks like coffee grounds; red spots on the skin; unusual bruising or bleeding from the eye, gums, or nose  signs and symptoms of a blood clot such as breathing problems; chest pain; severe, sudden headache; pain, swelling, warmth in the leg  signs and symptoms of a stroke like changes in vision; confusion; trouble speaking or understanding; severe headaches; sudden numbness or weakness of the face, arm or leg; trouble walking; dizziness; loss of balance or coordination  stomach pain  sweating  swelling of legs or ankles  vomiting  weight gain Side effects that usually do not require medical attention (report to your doctor or health care professional if they continue or are  bothersome):  back pain  changes in taste  decreased appetite  dry skin  nausea  tiredness This list may not describe all possible side effects. Call your doctor for medical advice about side effects. You may report side effects to FDA at 1-800-FDA-1088. Where should I keep my medicine? This drug is given in a hospital or clinic and will not be stored at home. NOTE: This sheet is a summary. It may not cover all possible information. If you have questions about this medicine, talk to your doctor, pharmacist, or health care provider.  2021 Elsevier/Gold Standard (2019-09-30 10:50:46)

## 2021-05-03 NOTE — Progress Notes (Addendum)
Hematology/Oncology Follow Up Note Brittney [facility]  [phone] [phone]  Patient Care Team: Ranae Plumber, [place] as PCP - General (Family Medicine) Telford Nab, RN as Oncology Nurse Navigator [name], MD as Medical Oncologist (Oncology)   Name of the patient: Brittney [facility]  [id number]  [birth year]   REASON FOR VISIT  follow-up for lung cancer  PERTINENT ONCOLOGY HISTORY 60 y.o. female with past medical history including GERD, COPD, current everyday smoker presents for follow-up of lung mass and pleural effusion. 10/20/2020, patient was brought to ED via EMS due to generalized weakness, dizziness, chest pain shortness of breath.  She was recently diagnosed with COPD approximately 1 month ago by primary care provider.  Also unintentional weight loss during the last few months. Image work-up showed right-sided pleural effusion.  She underwent right thoracentesis.  With removal of 2.4 L of fluid. 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  Metastatic disease to low cervical/thoracic nodes, lung, right pleural space and bones.  Moderate to large right pleural effusion.  SVC narrowing. 10/21/2020 MRI brain is negative for metastasis.  Mild chronic microvascular ischemic changes in the white matter.  Negative for acute infarct Regarding to the SVC narrowing, patient was seen by vascular surgeon and was recommended no intervention inpatient.  Patient to follow-up outpatient with vascular surgeon for evaluation. Patient also was treated for COPD exacerbation with hypoxia on exertion. Qualifies for home oxygen and hospitalist arrange home health and a nebulizer.  Patient was given a course of prednisone taper and empiric Levaquin. Patient was discharged and present today to follow-up with cytology results and further management plan  10/27/2020 PET scan showed right  upper lobe primary bronchogenic mass with direct invasion into mediastinum. Metastatic disease to the right pleural space, lung, bone, nodes of the chest and less so lower leg/upper abdomen. Hypermetabolic him along the course of the right axillary vein with concurrent subtle hyper attenuation within the axillary vein and SVC.  This continues to the level of SVC narrowing.  Suspicious for SVC occlusion and developing thrombus. Moderate right pleural effusion and small pericardial effusion.  # # left supraclavicular mass biopsy- pathology is positive for adenocarcinoma. positive for CK7, with patchy, weak CK20. They are  negative for TTF1, NapsinA, GATA3, CDX2, and Pax8. The findings are  nonspecific; but may be compatible with a poorly differentiated  adenocarcinoma of lung origin, especially given the imaging findings  She also had another repeat thoracentesis and fluid cytology showed malignancy.  #NGS showed AXIN 1 [id number], DIS2 M152f, KRAS G12C, PRDM1 M1 [id number], [id number] MS stable, TPS <1%   11/14/2020- 12/12/2020.  #Bronchial obstruction and SVC occlusion.  s/p palliative radiation #After 2 cycles of chemtherapy, patient had CT scan done due to shortness of breath.  01/05/2021, CT chest angiogram PE protocol showed no evidence of pulmonary embolism.  Stable disease.  Right upper lobe mass about 6.7 x 5.3 cm.  Widespread metastatic disease, lung and bone involvement. Interval development of right-sided hydronephrosis. 03/06/2021 finished T2 palliative radiation 03/22/2021, 6 cycles of carboplatin/Alimta/bevacizumab.  Infusion reaction of carboplatin during cycle 6.  Carboplatin discontinued. 04/07/2021 CT chest abdomen pelvis-partial response Mild decrease in size of right lung mass, mildly decreased mediastinal and hilar adenopathy.  Extensive right-sided pleural metastasis unchanged.  Similar appearance of diffuse bilateral pulmonary nodules.  Multifocal lytic sclerotic bone  metastasis.  New superior endplate deformity of T6  INTERVAL HISTORY Brittney HIGHAMis  a 60 y.o. female who has above history reviewed by me today presents for follow up visit for management of metastatic lung cancer, acute visit for swallowing pain.  Problems and complaints are listed below Patient reports feeling off balance when she stands up from sitting position. Otherwise she is doing well.  Denies any shortness of breath, cough, chest pain, unintentional weight loss.  She has gained 3 pounds since last visit.   Review of Systems  Constitutional: Negative for chills, fatigue, fever and unexpected weight change.  HENT:   Negative for hearing loss and voice change.   Eyes: Negative for eye problems.  Respiratory: Negative for chest tightness, cough and shortness of breath.   Cardiovascular: Negative for chest pain.  Gastrointestinal: Negative for abdominal distention, abdominal pain and blood in stool.  Endocrine: Negative for hot flashes.  Genitourinary: Negative for difficulty urinating and frequency.   Musculoskeletal: Negative for arthralgias.  Skin: Negative for itching and rash.  Neurological: Negative for extremity weakness.  Hematological: Negative for adenopathy.  Psychiatric/Behavioral: Negative for confusion.      Allergies  Allergen Reactions  . Carboplatin Shortness Of Breath, Itching and Cough     Past Medical History:  Diagnosis Date  . COPD (chronic obstructive pulmonary disease) ([place])   . GERD (gastroesophageal reflux disease)   . Metastatic lung cancer (metastasis from lung to other site) [facility]) 11/02/2020     Past Surgical History:  Procedure Laterality Date  . COLONOSCOPY WITH ESOPHAGOGASTRODUODENOSCOPY (EGD)    . COLONOSCOPY WITH PROPOFOL N/A 11/14/2015   Procedure: COLONOSCOPY WITH PROPOFOL;  Surgeon: Hulen Luster, MD;  Location[facility] ENDOSCOPY;  Service: Gastroenterology;  Laterality: N/A;  . ESOPHAGOGASTRODUODENOSCOPY (EGD) WITH PROPOFOL N/A  11/14/2015   Procedure: ESOPHAGOGASTRODUODENOSCOPY (EGD) WITH PROPOFOL;  Surgeon: Hulen Luster, MD;  Location: [facility] ENDOSCOPY;  Service: Gastroenterology;  Laterality: N/A;    Social History   Socioeconomic History  . Marital status: Married    Spouse name: Not on file  . Number of children: Not on file  . Years of education: Not on file  . Highest education level: Not on file  Occupational History  . Not on file  Tobacco Use  . Smoking status: Current Every Day Smoker    Packs/day: 0.20    Types: Cigarettes  . Smokeless tobacco: Never Used  Substance and Sexual Activity  . Alcohol use: Never  . Drug use: Never  . Sexual activity: Not Currently  Other Topics Concern  . Not on file  Social History Narrative  . Not on file   Social Determinants of Health   Financial Resource Strain: Not on file  Food Insecurity: Not on file  Transportation Needs: Not on file  Physical Activity: Not on file  Stress: Not on file  Social Connections: Not on file  Intimate Partner Violence: Not on file    Family History  Problem Relation Age of Onset  . Breast cancer Cousin 10  . Diabetes type II Mother   . Hypertension Mother   . Colon cancer Father   . Hypertension Father      Current Outpatient Medications:  .  acetaminophen (TYLENOL) 500 MG tablet, Take 1,000 mg by mouth every 8 (eight) hours as needed for moderate pain. , Disp: , Rfl:  .  benzonatate (TESSALON) 100 MG capsule, Take 1 capsule (100 mg total) by mouth 3 (three) times daily as needed for cough., Disp: 60 capsule, Rfl: 1 .  dexamethasone (DECADRON) 4 MG tablet, Take 1 tab (4  mg) twice a day the day before Alimta chemo. Take 2 tablets (8 mg) po daily x 3 days starting the day after chemo., Disp: 30 tablet, Rfl: 1 .  folic acid (FOLVITE) 1 MG tablet, Take 1 tablet (1 mg total) by mouth daily. Start 5-7 days before Alimta chemotherapy. Continue until 21 days after Alimta completed., Disp: 100 tablet, Rfl: 3 .  guaiFENesin  (MUCINEX) 600 MG 12 hr tablet, Take 1 tablet (600 mg total) by mouth 2 (two) times daily., Disp: 14 tablet, Rfl: 0 .  ipratropium-albuterol (DUONEB) 0.5-2.5 (3) MG/3ML SOLN, Take 3 mLs by nebulization 3 (three) times daily., Disp: 360 mL, Rfl: 1 .  magic mouthwash w/lidocaine SOLN, Take 5 mLs by mouth 4 (four) times daily as needed for mouth pain. Sig: Swish/Swallow 5-10 ml four times a day as needed. Dispense 480 ml. 1RF, Disp: 480 mL, Rfl: 1 .  omeprazole (PRILOSEC) 20 MG capsule, Take 1 capsule (20 mg total) by mouth 2 (two) times daily before a meal., Disp: 60 capsule, Rfl: 1 .  ondansetron (ZOFRAN) 8 MG tablet, Take 1 tablet (8 mg total) by mouth 2 (two) times daily as needed for refractory nausea / vomiting. Start on day 3 after chemo. (Patient not taking: No sig reported), Disp: 30 tablet, Rfl: 1 .  oxyCODONE (OXY IR/ROXICODONE) 5 MG immediate release tablet, Take 1 tablet (5 mg total) by mouth every 6 (six) hours as needed for moderate pain or severe pain., Disp: 60 tablet, Rfl: 0 .  Polyethylene Glycol 3350 (MIRALAX PO), Take by mouth daily as needed., Disp: , Rfl:  .  prochlorperazine (COMPAZINE) 10 MG tablet, Take 1 tablet (10 mg total) by mouth every 6 (six) hours as needed (Nausea or vomiting). (Patient not taking: No sig reported), Disp: 30 tablet, Rfl: 1 .  protein supplement shake (PREMIER PROTEIN) LIQD, Take 2 oz by mouth 4 (four) times daily., Disp: , Rfl:  .  senna-docusate (SENOKOT-S) 8.6-50 MG tablet, Take 2 tablets by mouth daily., Disp: 60 tablet, Rfl: 3 .  sucralfate (CARAFATE) 1 GM/10ML suspension, Take 10 mLs (1 g total) by mouth in the morning, at noon, and at bedtime., Disp: 420 mL, Rfl: 0 .  tiotropium (SPIRIVA HANDIHALER) 18 MCG inhalation capsule, Place 18 mcg into inhaler and inhale daily. (Patient not taking: No sig reported), Disp: , Rfl:   Physical exam: ECOG 1 There were no vitals filed for this visit. Physical Exam Constitutional:      General: She is not in  acute distress.    Comments: Thin built female walks independently  HENT:     Head: Normocephalic and atraumatic.  Eyes:     General: No scleral icterus. Cardiovascular:     Rate and Rhythm: Normal rate and regular rhythm.     Heart sounds: Normal heart sounds.  Pulmonary:     Effort: Pulmonary effort is normal. No respiratory distress.     Breath sounds: No wheezing.     Comments: Slightly decreased breath sound bilaterally.  Improved Abdominal:     General: Bowel sounds are normal. There is no distension.     Palpations: Abdomen is soft.  Musculoskeletal:        General: No deformity. Normal range of motion.     Cervical back: Normal range of motion and neck supple.  Skin:    General: Skin is warm and dry.     Findings: No erythema or rash.  Neurological:     Mental Status: She is alert and  oriented to person, place, and time. Mental status is at baseline.     Cranial Nerves: No cranial nerve deficit.     Coordination: Coordination normal.  Psychiatric:        Mood and Affect: Mood normal.     CMP Latest Ref Rng & Units 04/12/2021  Glucose 70 - 99 mg/dL 93  BUN 8 - 23 mg/dL 21  Creatinine 0.44 - 1.00 mg/dL 0.73  Sodium 135 - 145 mmol/L 140  Potassium 3.5 - 5.1 mmol/L 3.4(L)  Chloride 98 - 111 mmol/L 105  CO2 22 - 32 mmol/L 26  Calcium 8.9 - 10.3 mg/dL 9.1  Total Protein 6.5 - 8.1 g/dL 7.5  Total Bilirubin 0.3 - 1.2 mg/dL 0.4  Alkaline Phos 38 - 126 U/L 67  AST 15 - 41 U/L 21  ALT 0 - 44 U/L 14   CBC Latest Ref Rng & Units 04/12/2021  WBC 4.0 - 10.5 K/uL 5.7  Hemoglobin 12.0 - 15.0 g/dL 11.6(L)  Hematocrit 36.0 - 46.0 % 35.9(L)  Platelets 150 - 400 K/uL 205    RADIOGRAPHIC STUDIES: I have personally reviewed the radiological images as listed and agreed with the findings in the report. CT CHEST ABDOMEN PELVIS W CONTRAST  Result Date: 04/08/2021 CLINICAL DATA:  Restaging lung cancer. EXAM: CT CHEST, ABDOMEN, AND PELVIS WITH CONTRAST TECHNIQUE: Multidetector CT  imaging of the chest, abdomen and pelvis was performed following the standard protocol during bolus administration of intravenous contrast. CONTRAST:  47m OMNIPAQUE IOHEXOL 300 MG/ML  SOLN COMPARISON:  CT angio chest 01/05/2021 and CT AP 01/20/2021 FINDINGS: CT CHEST FINDINGS Cardiovascular: Heart size is within normal limits. No pericardial effusion identified. Aortic atherosclerosis. Mediastinum/Nodes: Extensive thoracic adenopathy. Right paratracheal lymph node measures 2.1 cm, image 21/2. Previously 2.3 cm. Low right paratracheal lymph node measures 1.8 cm, image 27/2. Previously 1.9 cm. Left pre vascular lymph node measures 1.3 cm, image 27/2. Previously 1.5 cm. Subcarinal node measures 1.9 cm, image 30/2.  Previously 2.4 cm. Right hilar node measures 1.4 cm, image 32/2.  Previously 1.6 cm. Left hilar node measures 1.4 cm, image 39/2.  Previously 1.8 cm. Lungs/Pleura: Rind of tumor encasing the right lung is again noted compatible with metastatic disease to the pleura. Interval re-expansion of the right upper lobe with decreased partial atelectasis of the right middle lobe. As on the previous exam the margins of the right lung mass are difficult to delineate. Best estimate this currently measures 8.2 x 4.8 cm, image 27/2. Previously 8.4 x 6.1 cm multiple pulmonary nodules are scattered throughout both lungs compatible with metastatic disease. These appear similar in size, multiplicity and distribution. Musculoskeletal: Mixed lytic and sclerotic bone lesions are again noted within the visualized axial and proximal appendicular skeleton. These appear similar to the previous exam. Superior endplate deformity involving the T6 vertebra is new from 01/05/2021. CT ABDOMEN PELVIS FINDINGS Hepatobiliary: No focal liver abnormality is seen. No gallstones, gallbladder wall thickening, or biliary dilatation. Pancreas: Unremarkable. No pancreatic ductal dilatation or surrounding inflammatory changes. Spleen: Normal in size  without focal abnormality. Adrenals/Urinary Tract: Right-sided parapelvic cyst scratch set normal adrenal glands. Right-sided parapelvic cysts. Upper pole left kidney cyst measures 1.8 cm. No hydronephrosis identified bilaterally. Bladder appears collapsed. Stomach/Bowel: Stomach is nondistended. The appendix is visualized and appears normal. No bowel wall thickening, inflammation or distension. Vascular/Lymphatic: Aortic atherosclerosis. No aneurysm. No abdominopelvic adenopathy. Reproductive: Uterus and bilateral adnexa are unremarkable. Other: No ascites or discrete fluid collection. Musculoskeletal: Multifocal lytic and sclerotic bone metastases  are again noted. These appears stable when compared with the previous study. IMPRESSION: 1. Mild decrease in size of right lung mass with interval re-expansion of the right upper lobe with decreased subsegmental atelectasis of the right middle lobe. 2. Mediastinal and hilar adenopathy is mildly improved in the interval. 3. Extensive right-sided pleural metastasis, not significantly changed. 4. Similar appearance of diffuse bilateral pulmonary nodules compatible with metastatic disease. 5. Stable appearance of multifocal lytic and sclerotic bone metastases. 6. New mild superior endplate deformity is identified at T6. 7. Aortic atherosclerosis. Aortic Atherosclerosis (ICD10-I70.0). Electronically Signed   By: Kerby Moors M.D.   On: 04/08/2021 11:55     Assessment and plan  1. Primary malignant neoplasm of lung metastatic to other site, unspecified laterality ([place])   2. Encounter for antineoplastic chemotherapy   3. Bone metastasis ([place])   4. Neoplasm related pain   5. Difficulty balancing when standing    Cancer Staging Primary malignant neoplasm of lung metastatic to other site 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  cT4 N3 M1-  KRASG12C (Lumakras in subsequent line can be considered) First-line treatment with carboplatin/Alimta/bevacizumab.[cycle 6 carboplatin hypersensitivity-discontinue] 04/07/2021 CT Partial response.  Labs are reviewed and discussed with patient. Alimta and bevacizumab maintenance. 4/6/B12 injection  #Bone metastasis.  Status post T2 radiation.  Patient declined bisphosphonate  #Neoplasm related pain/treatment complication-pain appears to be stable. Continue oxycodone 5 mg every 4 hours as needed.  #Hypokalemia, potassium mildly decreased and stable.  potassium enriched food. #Balancing problems, encourage oral hydration. I repeated brain MRI with and without contrast for surveillance.- xanax PRN for claustrophobia   Lab MD Alimta, bevacizumab, 3 weeks.  [name], MD, PhD Hematology Oncology [facility] Pager- [phone] 05/03/2021

## 2021-05-03 NOTE — Addendum Note (Signed)
Addended by: [name] on: 05/03/2021 06:56 PM   Modules accepted: Orders

## 2021-05-03 NOTE — Progress Notes (Signed)
Pt tolerated all infusions well today with no problems or complaints. Pt left infusion suite stable and ambulatory.

## 2021-05-03 NOTE — Progress Notes (Signed)
Pt here for follow up. Pt reports she feels off balance at times.

## 2021-05-06 ENCOUNTER — Ambulatory Visit
Admission: RE | Admit: 2021-05-06 | Discharge: 2021-05-06 | Disposition: A | Discharge status: Home / Self Care | Payer: BC Managed Care – PPO | Source: Ambulatory Visit | Attending: Oncology | Admitting: Oncology

## 2021-05-06 DIAGNOSIS — C3490 Malignant neoplasm of unspecified part of unspecified bronchus or lung: Secondary | ICD-10-CM

## 2021-05-06 DIAGNOSIS — C7951 Secondary malignant neoplasm of bone: Secondary | ICD-10-CM

## 2021-05-08 ENCOUNTER — Telehealth: Payer: Self-pay | Admitting: Oncology

## 2021-05-08 NOTE — Telephone Encounter (Signed)
Per scheduling message from Hector niece requested her 6/8 appt to be moved due to graduation ceremony. Appts moved to 6/9 and detailed VM left for [name] about updated appts.

## 2021-05-24 ENCOUNTER — Ambulatory Visit: Payer: BC Managed Care – PPO

## 2021-05-24 ENCOUNTER — Ambulatory Visit: Payer: BC Managed Care – PPO | Admitting: Internal Medicine

## 2021-05-24 ENCOUNTER — Other Ambulatory Visit: Payer: BC Managed Care – PPO

## 2021-05-25 ENCOUNTER — Inpatient Hospital Stay: Payer: BC Managed Care – PPO | Attending: Internal Medicine | Admitting: Internal Medicine

## 2021-05-25 ENCOUNTER — Other Ambulatory Visit: Payer: Self-pay

## 2021-05-25 ENCOUNTER — Inpatient Hospital Stay: Payer: BC Managed Care – PPO

## 2021-05-25 VITALS — HR 98

## 2021-05-25 DIAGNOSIS — G35 Multiple sclerosis: Secondary | ICD-10-CM | POA: Diagnosis not present

## 2021-05-25 DIAGNOSIS — Z800 Family history of malignant neoplasm of digestive organs: Secondary | ICD-10-CM | POA: Insufficient documentation

## 2021-05-25 DIAGNOSIS — C7951 Secondary malignant neoplasm of bone: Secondary | ICD-10-CM | POA: Diagnosis not present

## 2021-05-25 DIAGNOSIS — E876 Hypokalemia: Secondary | ICD-10-CM | POA: Insufficient documentation

## 2021-05-25 DIAGNOSIS — C3490 Malignant neoplasm of unspecified part of unspecified bronchus or lung: Secondary | ICD-10-CM

## 2021-05-25 DIAGNOSIS — K219 Gastro-esophageal reflux disease without esophagitis: Secondary | ICD-10-CM | POA: Insufficient documentation

## 2021-05-25 DIAGNOSIS — Z5112 Encounter for antineoplastic immunotherapy: Secondary | ICD-10-CM | POA: Diagnosis not present

## 2021-05-25 DIAGNOSIS — J910 Malignant pleural effusion: Secondary | ICD-10-CM | POA: Diagnosis not present

## 2021-05-25 DIAGNOSIS — G893 Neoplasm related pain (acute) (chronic): Secondary | ICD-10-CM | POA: Insufficient documentation

## 2021-05-25 DIAGNOSIS — Z5111 Encounter for antineoplastic chemotherapy: Secondary | ICD-10-CM | POA: Insufficient documentation

## 2021-05-25 DIAGNOSIS — J449 Chronic obstructive pulmonary disease, unspecified: Secondary | ICD-10-CM | POA: Diagnosis not present

## 2021-05-25 DIAGNOSIS — F17210 Nicotine dependence, cigarettes, uncomplicated: Secondary | ICD-10-CM | POA: Insufficient documentation

## 2021-05-25 DIAGNOSIS — Z803 Family history of malignant neoplasm of breast: Secondary | ICD-10-CM | POA: Diagnosis not present

## 2021-05-25 LAB — COMPREHENSIVE METABOLIC PANEL
ALT: 23 U/L (ref 0–44)
AST: 32 U/L (ref 15–41)
Albumin: 3.1 g/dL — ABNORMAL LOW (ref 3.5–5.0)
Alkaline Phosphatase: 75 U/L (ref 38–126)
Anion gap: 13 (ref 5–15)
BUN: 16 mg/dL (ref 8–23)
CO2: 21 mmol/L — ABNORMAL LOW (ref 22–32)
Calcium: 8.5 mg/dL — ABNORMAL LOW (ref 8.9–10.3)
Chloride: 103 mmol/L (ref 98–111)
Creatinine, Ser: 0.77 mg/dL (ref 0.44–1.00)
GFR, Estimated: >60 mL/min (ref >60)
Glucose, Bld: 142 mg/dL — ABNORMAL HIGH (ref 70–99)
Potassium: 3.9 mmol/L (ref 3.5–5.1)
Sodium: 137 mmol/L (ref 135–145)
Total Bilirubin: 0.4 mg/dL (ref 0.3–1.2)
Total Protein: 7.4 g/dL (ref 6.5–8.1)

## 2021-05-25 LAB — CBC WITH DIFFERENTIAL/PLATELET
Abs Immature Granulocytes: 0.03 10*3/uL (ref 0.00–0.07)
Basophils Absolute: 0 10*3/uL (ref 0.0–0.1)
Basophils Relative: 0 %
Eosinophils Absolute: 0.1 10*3/uL (ref 0.0–0.5)
Eosinophils Relative: 1 %
HCT: 36 % (ref 36.0–46.0)
Hemoglobin: 11.7 g/dL — ABNORMAL LOW (ref 12.0–15.0)
Immature Granulocytes: 1 %
Lymphocytes Relative: 7 %
Lymphs Abs: 0.4 10*3/uL — ABNORMAL LOW (ref 0.7–4.0)
MCH: 28.5 pg (ref 26.0–34.0)
MCHC: 32.5 g/dL (ref 30.0–36.0)
MCV: 87.6 fL (ref 80.0–100.0)
Monocytes Absolute: 0.3 10*3/uL (ref 0.1–1.0)
Monocytes Relative: 4 %
Neutro Abs: 5.6 10*3/uL (ref 1.7–7.7)
Neutrophils Relative %: 87 %
Platelets: 227 10*3/uL (ref 150–400)
RBC: 4.11 MIL/uL (ref 3.87–5.11)
RDW: 16.5 % — ABNORMAL HIGH (ref 11.5–15.5)
WBC: 6.4 10*3/uL (ref 4.0–10.5)
nRBC: 0 % (ref 0.0–0.2)

## 2021-05-25 LAB — PROTEIN, URINE, RANDOM: Total Protein, Urine: 7 mg/dL

## 2021-05-25 MED ORDER — HEPARIN SOD (PORK) LOCK FLUSH 100 UNIT/ML IV SOLN
500.0000 [IU] | Freq: Once | INTRAVENOUS | Status: DC | PRN
  Filled 2021-05-25: qty 5

## 2021-05-25 MED ORDER — DEXAMETHASONE 4 MG PO TABS: ORAL_TABLET | ORAL | 1 refills | Status: DC

## 2021-05-25 MED ORDER — PROCHLORPERAZINE MALEATE 10 MG PO TABS
10.0000 mg | ORAL_TABLET | Freq: Once | ORAL | Status: AC
  Administered 2021-05-25: 10 mg via ORAL
  Filled 2021-05-25: qty 1

## 2021-05-25 MED ORDER — SODIUM CHLORIDE 0.9 % IV SOLN
500.0000 mg/m2 | Freq: Once | INTRAVENOUS | Status: AC
  Administered 2021-05-25: 900 mg via INTRAVENOUS
  Filled 2021-05-25: qty 20

## 2021-05-25 MED ORDER — CYANOCOBALAMIN 1000 MCG/ML IJ SOLN
1000.0000 ug | Freq: Once | INTRAMUSCULAR | Status: AC
Start: 2021-05-25 — End: 2021-05-25
  Administered 2021-05-25: 1000 ug via INTRAMUSCULAR
  Filled 2021-05-25: qty 1

## 2021-05-25 MED ORDER — SODIUM CHLORIDE 0.9 % IV SOLN
Freq: Once | INTRAVENOUS | Status: AC
  Filled 2021-05-25: qty 250

## 2021-05-25 MED ORDER — FOLIC ACID 1 MG PO TABS: 1.0000 mg | ORAL_TABLET | Freq: Every day | ORAL | 3 refills | Status: DC

## 2021-05-25 MED ORDER — SODIUM CHLORIDE 0.9 % IV SOLN
1000.0000 mg | Freq: Once | INTRAVENOUS | Status: AC
  Administered 2021-05-25: 1000 mg via INTRAVENOUS
  Filled 2021-05-25: qty 32

## 2021-05-25 NOTE — Addendum Note (Signed)
Addended by: Minta Balsam B on: 05/25/2021 10:45 AM   Modules accepted: Orders

## 2021-05-25 NOTE — Patient Instructions (Signed)
[facility] ONCOLOGY  Discharge Instructions: Thank you for choosing [facility] to provide your oncology and hematology care.  If you have a lab appointment with the [facility], please go directly to the [facility] and check in at the registration area.  Wear comfortable clothing and clothing appropriate for easy access to any Portacath or PICC line.   We strive to give you quality time with your provider. You may need to reschedule your appointment if you arrive late (15 or more minutes).  Arriving late affects you and other patients whose appointments are after yours.  Also, if you miss three or more appointments without notifying the office, you may be dismissed from the clinic at the provider's discretion.      For prescription refill requests, have your pharmacy contact our office and allow 72 hours for refills to be completed.    Today you received the following chemotherapy and/or immunotherapy agents Zirabev and Alimta.       To help prevent nausea and vomiting after your treatment, we encourage you to take your nausea medication as directed.  BELOW ARE SYMPTOMS THAT SHOULD BE REPORTED IMMEDIATELY: *FEVER GREATER THAN 100.4 F (38 C) OR HIGHER *CHILLS OR SWEATING *NAUSEA AND VOMITING THAT IS NOT CONTROLLED WITH YOUR NAUSEA MEDICATION *UNUSUAL SHORTNESS OF BREATH *UNUSUAL BRUISING OR BLEEDING *URINARY PROBLEMS (pain or burning when urinating, or frequent urination) *BOWEL PROBLEMS (unusual diarrhea, constipation, pain near the anus) TENDERNESS IN MOUTH AND THROAT WITH OR WITHOUT PRESENCE OF ULCERS (sore throat, sores in mouth, or a toothache) UNUSUAL RASH, SWELLING OR PAIN  UNUSUAL VAGINAL DISCHARGE OR ITCHING   Items with * indicate a potential emergency and should be followed up as soon as possible or go to the Emergency Department if any problems should occur.  Please show the CHEMOTHERAPY ALERT CARD or IMMUNOTHERAPY ALERT CARD at  check-in to the Emergency Department and triage nurse.  Should you have questions after your visit or need to cancel or reschedule your appointment, please contact [facility]  [phone] and follow the prompts.  Office hours are 8:00 a.m. to 4:30 p.m. Monday - Friday. Please note that voicemails left after 4:00 p.m. may not be returned until the following business day.  We are closed weekends and major holidays. You have access to a nurse at all times for urgent questions. Please call the main number to the clinic [phone] and follow the prompts.  For any non-urgent questions, you may also contact your provider using MyChart. We now offer e-Visits for anyone 62 and older to request care online for non-urgent symptoms. For details visit [email or web address].   Also download the MyChart app! Go to the app store, search "MyChart", open the app, select [facility], and log in with your MyChart username and password.  Due to Covid, a mask is required upon entering the hospital/clinic. If you do not have a mask, one will be given to you upon arrival. For doctor visits, patients may have 1 support person aged 62 or older with them. For treatment visits, patients cannot have anyone with them due to current Covid guidelines and our immunocompromised population.

## 2021-05-25 NOTE — Assessment & Plan Note (Addendum)
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  cT4 N3 M1-currently on maintenance Alimta-bevacizumab.  Tolerating chemotherapy well.  #Proceed with Alimta bevacizumab today. Labs today reviewed;  acceptable for treatment today.   # Bone metastasis.  Status post T2 radiation; Patient declined bisphosphonate  #Neoplasm related pain/treatment complication-pain appears to be stable.Continue oxycodone 5 mg every 4 hours as needed.   Stable  #Hypokalemia, potassium mildly decreased and stable.  potassium enriched food.  Stable   # DISPOSITION: # chemo today # follow up in 3 weeks- MD-Dr.Yu. labs- cbc/cmp;UA- alimat-bev-Dr.B

## 2021-05-25 NOTE — Progress Notes (Signed)
[place] NOTE  Patient Care Team: Ranae Plumber, [place] as PCP - General (Family Medicine) Telford Nab, RN as Oncology Nurse Navigator [name], MD as Medical Oncologist (Oncology)  CHIEF COMPLAINTS/PURPOSE OF CONSULTATION:  Metastatic lung cancer  #  Oncology History  Metastatic lung cancer (metastasis from lung to other site) [facility])  11/02/2020 Initial Diagnosis   Metastatic lung cancer (metastasis from lung to other site) [facility])    11/18/2020 -  Chemotherapy    Patient is on Treatment Plan: LUNG PEMETREXED (ALIMTA) + CARBOPLATIN + BEVACIZUMAB Q21D X 1 CYCLE        Primary malignant neoplasm of lung metastatic to other site [facility])  11/04/2020 Initial Diagnosis   Primary malignant neoplasm of lung metastatic to other site [facility])    11/04/2020 Cancer Staging   Staging form: Lung, AJCC 8th Edition - Clinical stage from 11/04/2020: Stage IV (cT4, cN3, cM1) - Signed by [name], MD on 11/04/2020       HISTORY OF PRESENTING ILLNESS:  Brittney Fisher 60 y.o.  female with metastatic lung cancer currently on maintenance Alimta bev is here for follow-up.  Patient denies any nausea vomiting or diarrhea.  Denies any headaches.  Denies any vision problems.  No strokes.  Denies abdominal pain.   Review of Systems  Constitutional:  Negative for chills, diaphoresis, fever, malaise/fatigue and weight loss.  HENT:  Negative for nosebleeds and sore throat.   Eyes:  Negative for double vision.  Respiratory:  Negative for cough, hemoptysis, sputum production, shortness of breath and wheezing.   Cardiovascular:  Negative for chest pain, palpitations, orthopnea and leg swelling.  Gastrointestinal:  Negative for abdominal pain, blood in stool, constipation, diarrhea, heartburn, melena, nausea and vomiting.  Genitourinary:  Negative for dysuria, frequency and urgency.  Musculoskeletal:  Positive for joint pain. Negative for back pain.  Skin: Negative.  Negative for  itching and rash.  Neurological:  Negative for dizziness, tingling, focal weakness, weakness and headaches.  Endo/Heme/Allergies:  Does not bruise/bleed easily.  Psychiatric/Behavioral:  Negative for depression. The patient is not nervous/anxious and does not have insomnia.     MEDICAL HISTORY:  Past Medical History:  Diagnosis Date   COPD (chronic obstructive pulmonary disease) (HCC)    GERD (gastroesophageal reflux disease)    Metastatic lung cancer (metastasis from lung to other site) ([place]) 11/02/2020    SURGICAL HISTORY: Past Surgical History:  Procedure Laterality Date   COLONOSCOPY WITH ESOPHAGOGASTRODUODENOSCOPY (EGD)     COLONOSCOPY WITH PROPOFOL N/A 11/14/2015   Procedure: COLONOSCOPY WITH PROPOFOL;  Surgeon: Hulen Luster, MD;  Location: [facility] ENDOSCOPY;  Service: Gastroenterology;  Laterality: N/A;   ESOPHAGOGASTRODUODENOSCOPY (EGD) WITH PROPOFOL N/A 11/14/2015   Procedure: ESOPHAGOGASTRODUODENOSCOPY (EGD) WITH PROPOFOL;  Surgeon: Hulen Luster, MD;  Location: [facility] ENDOSCOPY;  Service: Gastroenterology;  Laterality: N/A;    SOCIAL HISTORY: Social History   Socioeconomic History   Marital status: Married    Spouse name: Not on file   Number of children: Not on file   Years of education: Not on file   Highest education level: Not on file  Occupational History   Not on file  Tobacco Use   Smoking status: Every Day    Packs/day: 0.20    Pack years: 0.00    Types: Cigarettes   Smokeless tobacco: Never  Substance and Sexual Activity   Alcohol use: Never   Drug use: Never   Sexual activity: Not Currently  Other Topics Concern   Not on  file  Social History Narrative   Not on file   Social Determinants of Health   Financial Resource Strain: Not on file  Food Insecurity: Not on file  Transportation Needs: Not on file  Physical Activity: Not on file  Stress: Not on file  Social Connections: Not on file  Intimate Partner Violence: Not on file    FAMILY  HISTORY: Family History  Problem Relation Age of Onset   Breast cancer Cousin 42   Diabetes type II Mother    Hypertension Mother    Colon cancer Father    Hypertension Father     ALLERGIES:  is allergic to carboplatin.  MEDICATIONS:  Current Outpatient Medications  Medication Sig Dispense Refill   acetaminophen (TYLENOL) 500 MG tablet Take 1,000 mg by mouth every 8 (eight) hours as needed for moderate pain.      ALPRAZolam (XANAX) 0.5 MG tablet Take 1 tablet (0.5 mg total) by mouth See admin instructions. Take 1 tablet 30 minutes prior to MRI procedure, may take another tablet if needed. 2 tablet 0   benzonatate (TESSALON) 100 MG capsule Take 1 capsule (100 mg total) by mouth 3 (three) times daily as needed for cough. 60 capsule 1   guaiFENesin (MUCINEX) 600 MG 12 hr tablet Take 1 tablet (600 mg total) by mouth 2 (two) times daily. 14 tablet 0   ipratropium-albuterol (DUONEB) 0.5-2.5 (3) MG/3ML SOLN Take 3 mLs by nebulization 3 (three) times daily. 360 mL 1   magic mouthwash w/lidocaine SOLN Take 5 mLs by mouth 4 (four) times daily as needed for mouth pain. Sig: Swish/Swallow 5-10 ml four times a day as needed. Dispense 480 ml. 1RF 480 mL 1   omeprazole (PRILOSEC) 20 MG capsule Take 1 capsule (20 mg total) by mouth 2 (two) times daily before a meal. 60 capsule 1   ondansetron (ZOFRAN) 8 MG tablet Take 1 tablet (8 mg total) by mouth 2 (two) times daily as needed for refractory nausea / vomiting. Start on day 3 after chemo. 30 tablet 1   oxyCODONE (OXY IR/ROXICODONE) 5 MG immediate release tablet Take 1 tablet (5 mg total) by mouth every 6 (six) hours as needed for moderate pain or severe pain. 60 tablet 0   Polyethylene Glycol 3350 (MIRALAX PO) Take by mouth daily as needed.     prochlorperazine (COMPAZINE) 10 MG tablet Take 1 tablet (10 mg total) by mouth every 6 (six) hours as needed (Nausea or vomiting). 30 tablet 1   protein supplement shake (PREMIER PROTEIN) LIQD Take 2 oz by mouth 4  (four) times daily.     senna-docusate (SENOKOT-S) 8.6-50 MG tablet Take 2 tablets by mouth daily. 60 tablet 3   sucralfate (CARAFATE) 1 GM/10ML suspension Take 10 mLs (1 g total) by mouth in the morning, at noon, and at bedtime. 420 mL 0   tiotropium (SPIRIVA HANDIHALER) 18 MCG inhalation capsule Place 18 mcg into inhaler and inhale daily.     dexamethasone (DECADRON) 4 MG tablet Take 1 tab (4 mg) twice a day the day before Alimta chemo. Take 2 tablets (8 mg) po daily x 3 days starting the day after chemo. 30 tablet 1   folic acid (FOLVITE) 1 MG tablet Take 1 tablet (1 mg total) by mouth daily. Start 5-7 days before Alimta chemotherapy. Continue until 21 days after Alimta completed. 100 tablet 3   No current facility-administered medications for this visit.   Facility-Administered Medications Ordered in Other Visits  Medication Dose Route Frequency Provider  Last Rate Last Admin   bevacizumab-bvzr (ZIRABEV) 1,000 mg in sodium chloride 0.9 % 100 mL chemo infusion  1,000 mg Intravenous Once Cammie Sickle, MD       cyanocobalamin ((VITAMIN B-12)) injection 1,000 mcg  1,000 mcg Intramuscular Once Charlaine Dalton R, MD       heparin lock flush 100 unit/mL  500 Units Intracatheter Once PRN Cammie Sickle, MD       PEMEtrexed (ALIMTA) 900 mg in sodium chloride 0.9 % 100 mL chemo infusion  500 mg/m2 (Treatment Plan Recorded) Intravenous Once Cammie Sickle, MD       prochlorperazine (COMPAZINE) tablet 10 mg  10 mg Oral Once Cammie Sickle, MD          .  PHYSICAL EXAMINATION: ECOG PERFORMANCE STATUS: 1 - Symptomatic but completely ambulatory  Vitals:   05/25/21 0930  BP: 135/84  Pulse: (!) 103  Temp: 97.7 F (36.5 C)  SpO2: 99%   Filed Weights   05/25/21 0930  Weight: 157 lb 9.6 oz (71.5 kg)    Physical Exam HENT:     Head: Normocephalic and atraumatic.     Mouth/Throat:     Pharynx: No oropharyngeal exudate.  Eyes:     Pupils: Pupils are equal,  round, and reactive to light.  Cardiovascular:     Rate and Rhythm: Normal rate and regular rhythm.  Pulmonary:     Effort: No respiratory distress.     Breath sounds: No wheezing.  Abdominal:     General: Bowel sounds are normal. There is no distension.     Palpations: Abdomen is soft. There is no mass.     Tenderness: There is no abdominal tenderness. There is no guarding or rebound.  Musculoskeletal:        General: No tenderness. Normal range of motion.     Cervical back: Normal range of motion and neck supple.  Skin:    General: Skin is warm.  Neurological:     Mental Status: She is alert and oriented to person, place, and time.  Psychiatric:        Mood and Affect: Affect normal.     LABORATORY DATA:  I have reviewed the data as listed Lab Results  Component Value Date   WBC 6.4 05/25/2021   HGB 11.7 (L) 05/25/2021   HCT 36.0 05/25/2021   MCV 87.6 05/25/2021   PLT 227 05/25/2021   Recent Labs    10/21/20 0541 11/18/20 0756 04/12/21 0803 05/03/21 0812 05/25/21 0901  NA 134*   < > 140 135 137  K 3.9   < > 3.4* 3.4* 3.9  CL 102   < > 105 102 103  CO2 23   < > 26 25 21*  GLUCOSE 101*   < > 93 84 142*  BUN 13   < > 21 17 16   CREATININE 0.76   < > 0.73 0.64 0.77  CALCIUM 8.5*   < > 9.1 9.0 8.5*  GFRNONAA >60   < > >60 >60 >60  PROT 6.4*   < > 7.5 7.7 7.4  ALBUMIN 2.8*   < > 3.4* 3.3* 3.1*  AST 16   < > 21 25 32  ALT 17   < > 14 20 23   ALKPHOS 62   < > 67 75 75  BILITOT 0.6   < > 0.4 0.4 0.4  BILIDIR <0.1  --   --   --   --   IBILI NOT  CALCULATED  --   --   --   --    < > = values in this interval not displayed.    RADIOGRAPHIC STUDIES: I have personally reviewed the radiological images as listed and agreed with the findings in the report. No results found.  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  cT4 N3 M1-currently on maintenance  Alimta-bevacizumab.  Tolerating chemotherapy well.  #Proceed with Alimta bevacizumab today. Labs today reviewed;  acceptable for treatment today.   # Bone metastasis.  Status post T2 radiation; Patient declined bisphosphonate   #Neoplasm related pain/treatment complication-pain appears to be stable.Continue oxycodone 5 mg every 4 hours as needed.   Stable  #Hypokalemia, potassium mildly decreased and stable.  potassium enriched food.  Stable   # DISPOSITION: # chemo today # follow up in 3 weeks- MD-Dr.Yu. labs- cbc/cmp;UA- alimat-bev-Dr.B    All questions were answered. The patient knows to call the clinic with any problems, questions or concerns.       Cammie Sickle, MD 05/25/2021 10:21 AM

## 2021-06-07 ENCOUNTER — Other Ambulatory Visit: Payer: Self-pay | Admitting: *Deleted

## 2021-06-07 ENCOUNTER — Telehealth: Payer: Self-pay

## 2021-06-07 MED ORDER — OXYCODONE HCL 5 MG PO TABS: 5.0000 mg | ORAL_TABLET | Freq: Four times a day (QID) | ORAL | 0 refills | Status: DC | PRN

## 2021-06-07 NOTE — Telephone Encounter (Signed)
Patient's family member called and stated that patient has had an increase in a productive cough for the last few days, producing yellow phlegm.  Also reports a small amount of clear nasal drainage. Denies fever. She has requested an appointment with symptom management clinic. Pt scheduled for 1015 on Thursday, 6/22. Family member verbalized understanding.

## 2021-06-08 ENCOUNTER — Other Ambulatory Visit: Payer: Self-pay

## 2021-06-08 ENCOUNTER — Inpatient Hospital Stay (HOSPITAL_BASED_OUTPATIENT_CLINIC_OR_DEPARTMENT_OTHER): Payer: BC Managed Care – PPO | Admitting: Hospice and Palliative Medicine

## 2021-06-08 ENCOUNTER — Encounter: Payer: Self-pay | Admitting: Hospice and Palliative Medicine

## 2021-06-08 VITALS — BP 109/78 | HR 110 | Temp 98.9°F | Resp 18 | Wt 153.8 lb

## 2021-06-08 DIAGNOSIS — Z5112 Encounter for antineoplastic immunotherapy: Secondary | ICD-10-CM | POA: Diagnosis not present

## 2021-06-08 DIAGNOSIS — C3490 Malignant neoplasm of unspecified part of unspecified bronchus or lung: Secondary | ICD-10-CM | POA: Diagnosis not present

## 2021-06-08 DIAGNOSIS — J309 Allergic rhinitis, unspecified: Secondary | ICD-10-CM

## 2021-06-08 NOTE — Progress Notes (Signed)
Symptom Management [place]  Telephone:([phone] [phone]  Patient Care Team: Ranae Plumber, [place] as PCP - General (Family Medicine) Telford Nab, RN as Oncology Nurse Navigator [name], MD as Medical Oncologist (Oncology)   Name of the patient: Brittney Fisher  [id number]  September 07, 1961   Date of visit: 06/08/21  Reason for Consult: Brittney Fisher is a 60 year old female with multiple medical problems including COPD and metastatic lung cancer on systemic chemotherapy on maintenance pemetrexed/bevacizumab.  Patient is followed by Dr. [name] but saw Dr. Rogue Bussing on 05/25/2021 at which time she appeared to be at baseline.  She received Alimta bevacizumab treatment.  Patient presents to [facility] today with complaint of 1 to 2 weeks of runny nose, postnasal drip, and "itchy throat".  She has occasional productive cough with white phlegm but this is chronic for her.  She denies shortness of breath or wheezing.  No fever or chills.  She tried diphenhydramine and found it significantly improved her symptoms but caused her to be drowsy so she discontinued it.  Denies any neurologic complaints. Denies recent fevers or illnesses. Denies any easy bleeding or bruising. Reports good appetite and denies weight loss. Denies chest pain. Denies any nausea, vomiting, constipation, or diarrhea. Denies urinary complaints. Patient offers no further specific complaints today.  PAST MEDICAL HISTORY: Past Medical History:  Diagnosis Date   COPD (chronic obstructive pulmonary disease) (HCC)    GERD (gastroesophageal reflux disease)    Metastatic lung cancer (metastasis from lung to other site) ([place]) 11/02/2020    PAST SURGICAL HISTORY:  Past Surgical History:  Procedure Laterality Date   COLONOSCOPY WITH ESOPHAGOGASTRODUODENOSCOPY (EGD)     COLONOSCOPY WITH PROPOFOL N/A 11/14/2015   Procedure: COLONOSCOPY WITH PROPOFOL;  Surgeon: Hulen Luster, MD;  Location: [facility]  ENDOSCOPY;  Service: Gastroenterology;  Laterality: N/A;   ESOPHAGOGASTRODUODENOSCOPY (EGD) WITH PROPOFOL N/A 11/14/2015   Procedure: ESOPHAGOGASTRODUODENOSCOPY (EGD) WITH PROPOFOL;  Surgeon: Hulen Luster, MD;  Location: [facility] ENDOSCOPY;  Service: Gastroenterology;  Laterality: N/A;    HEMATOLOGY/ONCOLOGY HISTORY:  Oncology History  Metastatic lung cancer (metastasis from lung to other site) ([place])  11/02/2020 Initial Diagnosis   Metastatic lung cancer (metastasis from lung to other site) [facility])    11/18/2020 -  Chemotherapy    Patient is on Treatment Plan: LUNG PEMETREXED (ALIMTA) + CARBOPLATIN + BEVACIZUMAB Q21D X 1 CYCLE        Primary malignant neoplasm of lung metastatic to other site [facility])  11/04/2020 Initial Diagnosis   Primary malignant neoplasm of lung metastatic to other site [facility])    11/04/2020 Cancer Staging   Staging form: Lung, AJCC 8th Edition - Clinical stage from 11/04/2020: Stage IV (cT4, cN3, cM1) - Signed by [name], MD on 11/04/2020      ALLERGIES:  is allergic to carboplatin.  MEDICATIONS:  Current Outpatient Medications  Medication Sig Dispense Refill   acetaminophen (TYLENOL) 500 MG tablet Take 1,000 mg by mouth every 8 (eight) hours as needed for moderate pain.      benzonatate (TESSALON) 100 MG capsule Take 1 capsule (100 mg total) by mouth 3 (three) times daily as needed for cough. 60 capsule 1   dexamethasone (DECADRON) 4 MG tablet Take 1 tab (4 mg) twice a day the day before Alimta chemo. Take 2 tablets (8 mg) po daily x 3 days starting the day after chemo. 30 tablet 1   folic acid (FOLVITE) 1 MG tablet Take 1 tablet (1 mg total) by mouth  daily. Start 5-7 days before Alimta chemotherapy. Continue until 21 days after Alimta completed. 100 tablet 3   guaiFENesin (MUCINEX) 600 MG 12 hr tablet Take 1 tablet (600 mg total) by mouth 2 (two) times daily. 14 tablet 0   ipratropium-albuterol (DUONEB) 0.5-2.5 (3) MG/3ML SOLN Take 3 mLs by nebulization 3 (three)  times daily. 360 mL 1   magic mouthwash w/lidocaine SOLN Take 5 mLs by mouth 4 (four) times daily as needed for mouth pain. Sig: Swish/Swallow 5-10 ml four times a day as needed. Dispense 480 ml. 1RF 480 mL 1   omeprazole (PRILOSEC) 20 MG capsule Take 1 capsule (20 mg total) by mouth 2 (two) times daily before a meal. 60 capsule 1   ondansetron (ZOFRAN) 8 MG tablet Take 1 tablet (8 mg total) by mouth 2 (two) times daily as needed for refractory nausea / vomiting. Start on day 3 after chemo. 30 tablet 1   oxyCODONE (OXY IR/ROXICODONE) 5 MG immediate release tablet Take 1 tablet (5 mg total) by mouth every 6 (six) hours as needed for moderate pain or severe pain. 60 tablet 0   Polyethylene Glycol 3350 (MIRALAX PO) Take by mouth daily as needed.     prochlorperazine (COMPAZINE) 10 MG tablet Take 1 tablet (10 mg total) by mouth every 6 (six) hours as needed (Nausea or vomiting). 30 tablet 1   protein supplement shake (PREMIER PROTEIN) LIQD Take 2 oz by mouth 4 (four) times daily.     senna-docusate (SENOKOT-S) 8.6-50 MG tablet Take 2 tablets by mouth daily. 60 tablet 3   sucralfate (CARAFATE) 1 GM/10ML suspension Take 10 mLs (1 g total) by mouth in the morning, at noon, and at bedtime. 420 mL 0   tiotropium (SPIRIVA HANDIHALER) 18 MCG inhalation capsule Place 18 mcg into inhaler and inhale daily.     ALPRAZolam (XANAX) 0.5 MG tablet Take 1 tablet (0.5 mg total) by mouth See admin instructions. Take 1 tablet 30 minutes prior to MRI procedure, may take another tablet if needed. (Patient not taking: Reported on 06/08/2021) 2 tablet 0   No current facility-administered medications for this visit.    VITAL SIGNS: BP 109/78 (BP Location: Left Arm, Patient Position: Sitting)   Pulse (!) 110   Temp 98.9 F (37.2 C)   Resp 18   Wt 153 lb 12.8 oz (69.8 kg)   SpO2 100%   BMI 21.45 kg/m  Filed Weights   06/08/21 1015  Weight: 153 lb 12.8 oz (69.8 kg)    Estimated body mass index is 21.45 kg/m as  calculated from the following:   Height as of 10/31/20: 5\' 11"  (1.803 m).   Weight as of this encounter: 153 lb 12.8 oz (69.8 kg).  LABS: CBC:    Component Value Date/Time   WBC 6.4 05/25/2021 0901   HGB 11.7 (L) 05/25/2021 0901   HCT 36.0 05/25/2021 0901   PLT 227 05/25/2021 0901   MCV 87.6 05/25/2021 0901   NEUTROABS 5.6 05/25/2021 0901   LYMPHSABS 0.4 (L) 05/25/2021 0901   MONOABS 0.3 05/25/2021 0901   EOSABS 0.1 05/25/2021 0901   BASOSABS 0.0 05/25/2021 0901   Comprehensive Metabolic Panel:    Component Value Date/Time   NA 137 05/25/2021 0901   K 3.9 05/25/2021 0901   CL 103 05/25/2021 0901   CO2 21 (L) 05/25/2021 0901   BUN 16 05/25/2021 0901   CREATININE 0.77 05/25/2021 0901   GLUCOSE 142 (H) 05/25/2021 0901   CALCIUM 8.5 (L) 05/25/2021 0901  AST 32 05/25/2021 0901   ALT 23 05/25/2021 0901   ALKPHOS 75 05/25/2021 0901   BILITOT 0.4 05/25/2021 0901   PROT 7.4 05/25/2021 0901   ALBUMIN 3.1 (L) 05/25/2021 0901    RADIOGRAPHIC STUDIES: No results found.  PERFORMANCE STATUS (ECOG) : 1 - Symptomatic but completely ambulatory  Review of Systems Unless otherwise noted, a complete review of systems is negative.  Physical Exam General: NAD HEENT: Swollen nasal turbinates, slight erythematous OP but no exudate or thrush Cardiovascular: regular rate and rhythm Pulmonary: clear ant fields Abdomen: soft, nontender, + bowel sounds GU: no suprapubic tenderness Extremities: no edema, no joint deformities Skin: no rashes Neurological: Weakness but otherwise nonfocal  Assessment and Plan- Patient is a 60 y.o. female with multiple medical problems including COPD and metastatic lung cancer on systemic chemotherapy on maintenance pemetrexed/bevacizumab, who presents to [facility] for evaluation of nasal congestion and itchy throat.  Allergic rhinitis -as symptoms improved with diphenhydramine, I suspect that there is an allergic component.  I suspect that her itchy throat is  secondary to postnasal drip.  I would recommend starting daily Flonase and Claritin until symptoms resolve.  Can use Cepacol lozenges.  Continue benzonatate as needed.  Recommend COVID testing, although symptoms have exceeded the treatment window.  Patient instructed to return to clinic if symptoms are worsening or do not improve.   Case and plan discussed with Dr. [name]   Patient expressed understanding and was in agreement with this plan. She also understands that She can call clinic at any time with any questions, concerns, or complaints.   Thank you for allowing me to participate in the care of this very pleasant patient.   Time Total: 15 minutes  Visit consisted of counseling and education dealing with the complex and emotionally intense issues of symptom management and palliative care in the setting of serious and potentially life-threatening illness.Greater than 50%  of this time was spent counseling and coordinating care related to the above assessment and plan.  Signed by: Altha Harm, PhD, NP-C

## 2021-06-08 NOTE — Progress Notes (Signed)
Patient here today to see symptom management due worsening productive cough since last Thurs/ Friday. Pt has tried benadryl for itching in throat, which has helped some.

## 2021-06-16 ENCOUNTER — Encounter: Payer: Self-pay | Admitting: Oncology

## 2021-06-16 ENCOUNTER — Inpatient Hospital Stay: Payer: BC Managed Care – PPO

## 2021-06-16 ENCOUNTER — Inpatient Hospital Stay: Payer: BC Managed Care – PPO | Attending: Oncology | Admitting: Oncology

## 2021-06-16 VITALS — BP 128/90 | HR 99 | Temp 98.3°F | Resp 18 | Ht 71.0 in | Wt 155.0 lb

## 2021-06-16 DIAGNOSIS — C3490 Malignant neoplasm of unspecified part of unspecified bronchus or lung: Secondary | ICD-10-CM | POA: Insufficient documentation

## 2021-06-16 DIAGNOSIS — Z800 Family history of malignant neoplasm of digestive organs: Secondary | ICD-10-CM | POA: Insufficient documentation

## 2021-06-16 DIAGNOSIS — Z5111 Encounter for antineoplastic chemotherapy: Secondary | ICD-10-CM | POA: Insufficient documentation

## 2021-06-16 DIAGNOSIS — C7951 Secondary malignant neoplasm of bone: Secondary | ICD-10-CM | POA: Diagnosis not present

## 2021-06-16 DIAGNOSIS — K219 Gastro-esophageal reflux disease without esophagitis: Secondary | ICD-10-CM | POA: Diagnosis not present

## 2021-06-16 DIAGNOSIS — J449 Chronic obstructive pulmonary disease, unspecified: Secondary | ICD-10-CM | POA: Diagnosis not present

## 2021-06-16 DIAGNOSIS — Z5112 Encounter for antineoplastic immunotherapy: Secondary | ICD-10-CM | POA: Insufficient documentation

## 2021-06-16 DIAGNOSIS — J910 Malignant pleural effusion: Secondary | ICD-10-CM | POA: Insufficient documentation

## 2021-06-16 DIAGNOSIS — G893 Neoplasm related pain (acute) (chronic): Secondary | ICD-10-CM | POA: Diagnosis not present

## 2021-06-16 DIAGNOSIS — R2689 Other abnormalities of gait and mobility: Secondary | ICD-10-CM | POA: Diagnosis not present

## 2021-06-16 DIAGNOSIS — Z803 Family history of malignant neoplasm of breast: Secondary | ICD-10-CM | POA: Diagnosis not present

## 2021-06-16 DIAGNOSIS — F17210 Nicotine dependence, cigarettes, uncomplicated: Secondary | ICD-10-CM | POA: Insufficient documentation

## 2021-06-16 DIAGNOSIS — G35 Multiple sclerosis: Secondary | ICD-10-CM | POA: Diagnosis not present

## 2021-06-16 LAB — COMPREHENSIVE METABOLIC PANEL
ALT: 20 U/L (ref 0–44)
AST: 30 U/L (ref 15–41)
Albumin: 3.2 g/dL — ABNORMAL LOW (ref 3.5–5.0)
Alkaline Phosphatase: 71 U/L (ref 38–126)
Anion gap: 10 (ref 5–15)
BUN: 20 mg/dL (ref 8–23)
CO2: 25 mmol/L (ref 22–32)
Calcium: 8.9 mg/dL (ref 8.9–10.3)
Chloride: 104 mmol/L (ref 98–111)
Creatinine, Ser: 0.66 mg/dL (ref 0.44–1.00)
GFR, Estimated: >60 mL/min (ref >60)
Glucose, Bld: 80 mg/dL (ref 70–99)
Potassium: 3.7 mmol/L (ref 3.5–5.1)
Sodium: 139 mmol/L (ref 135–145)
Total Bilirubin: 0.4 mg/dL (ref 0.3–1.2)
Total Protein: 8.1 g/dL (ref 6.5–8.1)

## 2021-06-16 LAB — CBC WITH DIFFERENTIAL/PLATELET
Abs Immature Granulocytes: 0.05 10*3/uL (ref 0.00–0.07)
Basophils Absolute: 0 10*3/uL (ref 0.0–0.1)
Basophils Relative: 1 %
Eosinophils Absolute: 0 10*3/uL (ref 0.0–0.5)
Eosinophils Relative: 1 %
HCT: 39.1 % (ref 36.0–46.0)
Hemoglobin: 12.3 g/dL (ref 12.0–15.0)
Immature Granulocytes: 1 %
Lymphocytes Relative: 11 %
Lymphs Abs: 0.7 10*3/uL (ref 0.7–4.0)
MCH: 27.5 pg (ref 26.0–34.0)
MCHC: 31.5 g/dL (ref 30.0–36.0)
MCV: 87.5 fL (ref 80.0–100.0)
Monocytes Absolute: 0.8 10*3/uL (ref 0.1–1.0)
Monocytes Relative: 12 %
Neutro Abs: 4.9 10*3/uL (ref 1.7–7.7)
Neutrophils Relative %: 74 %
Platelets: 283 10*3/uL (ref 150–400)
RBC: 4.47 MIL/uL (ref 3.87–5.11)
RDW: 15.7 % — ABNORMAL HIGH (ref 11.5–15.5)
WBC: 6.5 10*3/uL (ref 4.0–10.5)
nRBC: 0 % (ref 0.0–0.2)

## 2021-06-16 LAB — PROTEIN, URINE, RANDOM: Total Protein, Urine: 14 mg/dL

## 2021-06-16 MED ORDER — BENZONATATE 100 MG PO CAPS: 100.0000 mg | ORAL_CAPSULE | Freq: Three times a day (TID) | ORAL | 1 refills | Status: DC | PRN

## 2021-06-16 MED ORDER — SODIUM CHLORIDE 0.9% FLUSH
10.0000 mL | INTRAVENOUS | Status: DC | PRN
  Filled 2021-06-16: qty 10

## 2021-06-16 MED ORDER — SODIUM CHLORIDE 0.9 % IV SOLN
500.0000 mg/m2 | Freq: Once | INTRAVENOUS | Status: AC
  Administered 2021-06-16: 900 mg via INTRAVENOUS
  Filled 2021-06-16: qty 20

## 2021-06-16 MED ORDER — SODIUM CHLORIDE 0.9 % IV SOLN
1000.0000 mg | Freq: Once | INTRAVENOUS | Status: AC
  Administered 2021-06-16: 1000 mg via INTRAVENOUS
  Filled 2021-06-16: qty 8

## 2021-06-16 MED ORDER — SODIUM CHLORIDE 0.9 % IV SOLN
Freq: Once | INTRAVENOUS | Status: AC
  Filled 2021-06-16: qty 250

## 2021-06-16 MED ORDER — HEPARIN SOD (PORK) LOCK FLUSH 100 UNIT/ML IV SOLN
500.0000 [IU] | Freq: Once | INTRAVENOUS | Status: DC | PRN
  Filled 2021-06-16: qty 5

## 2021-06-16 MED ORDER — PROCHLORPERAZINE MALEATE 10 MG PO TABS
10.0000 mg | ORAL_TABLET | Freq: Once | ORAL | Status: AC
Start: 2021-06-16 — End: 2021-06-16
  Administered 2021-06-16: 10 mg via ORAL
  Filled 2021-06-16: qty 1

## 2021-06-16 NOTE — Patient Instructions (Signed)
[facility]  Discharge Instructions: Thank you for choosing [facility] to provide your oncology and hematology care.  If you have a lab appointment with the [facility], please go directly to the [facility] and check in at the registration area.  Wear comfortable clothing and clothing appropriate for easy access to any Portacath or PICC line.   We strive to give you quality time with your provider. You may need to reschedule your appointment if you arrive late (15 or more minutes).  Arriving late affects you and other patients whose appointments are after yours.  Also, if you miss three or more appointments without notifying the office, you may be dismissed from the clinic at the provider's discretion.      For prescription refill requests, have your pharmacy contact our office and allow 72 hours for refills to be completed.    Today you received the following chemotherapy and/or immunotherapy agents - pemetrexed, bevacizumab      To help prevent nausea and vomiting after your treatment, we encourage you to take your nausea medication as directed.  BELOW ARE SYMPTOMS THAT SHOULD BE REPORTED IMMEDIATELY: *FEVER GREATER THAN 100.4 F (38 C) OR HIGHER *CHILLS OR SWEATING *NAUSEA AND VOMITING THAT IS NOT CONTROLLED WITH YOUR NAUSEA MEDICATION *UNUSUAL SHORTNESS OF BREATH *UNUSUAL BRUISING OR BLEEDING *URINARY PROBLEMS (pain or burning when urinating, or frequent urination) *BOWEL PROBLEMS (unusual diarrhea, constipation, pain near the anus) TENDERNESS IN MOUTH AND THROAT WITH OR WITHOUT PRESENCE OF ULCERS (sore throat, sores in mouth, or a toothache) UNUSUAL RASH, SWELLING OR PAIN  UNUSUAL VAGINAL DISCHARGE OR ITCHING   Items with * indicate a potential emergency and should be followed up as soon as possible or go to the Emergency Department if any problems should occur.  Please show the CHEMOTHERAPY ALERT CARD or IMMUNOTHERAPY ALERT  CARD at check-in to the Emergency Department and triage nurse. Pemetrexed injection What is this medication? PEMETREXED (PEM e TREX ed) is a chemotherapy drug used to treat lung cancers like non-small cell lung cancer and mesothelioma. It may also be used to treatother cancers. This medicine may be used for other purposes; ask your health care provider orpharmacist if you have questions. COMMON BRAND NAME(S): Alimta What should I tell my care team before I take this medication? They need to know if you have any of these conditions: infection (especially a virus infection such as chickenpox, cold sores, or herpes) kidney disease low blood counts, like low white cell, platelet, or red cell counts lung or breathing disease, like asthma radiation therapy an unusual or allergic reaction to pemetrexed, other medicines, foods, dyes, or preservative pregnant or trying to get pregnant breast-feeding How should I use this medication? This drug is given as an infusion into a vein. It is administered in a hospitalor clinic by a specially trained health care professional. Talk to your pediatrician regarding the use of this medicine in children.Special care may be needed. Overdosage: If you think you have taken too much of this medicine contact apoison control center or emergency room at once. NOTE: This medicine is only for you. Do not share this medicine with others. What if I miss a dose? It is important not to miss your dose. Call your doctor or health careprofessional if you are unable to keep an appointment. What may interact with this medication? This medicine may interact with the following medications: Ibuprofen This list may not describe all possible interactions. Give your health  care provider a list of all the medicines, herbs, non-prescription drugs, or dietary supplements you use. Also tell them if you smoke, drink alcohol, or use illegaldrugs. Some items may interact with your  medicine. What should I watch for while using this medication? Visit your doctor for checks on your progress. This drug may make you feel generally unwell. This is not uncommon, as chemotherapy can affect healthy cells as well as cancer cells. Report any side effects. Continue your course oftreatment even though you feel ill unless your doctor tells you to stop. In some cases, you may be given additional medicines to help with side effects.Follow all directions for their use. Call your doctor or health care professional for advice if you get a fever, chills or sore throat, or other symptoms of a cold or flu. Do not treat yourself. This drug decreases your body's ability to fight infections. Try toavoid being around people who are sick. This medicine may increase your risk to bruise or bleed. Call your doctor orhealth care professional if you notice any unusual bleeding. Be careful brushing and flossing your teeth or using a toothpick because you may get an infection or bleed more easily. If you have any dental work done,tell your dentist you are receiving this medicine. Avoid taking products that contain aspirin, acetaminophen, ibuprofen, naproxen, or ketoprofen unless instructed by your doctor. These medicines may hide afever. Call your doctor or health care professional if you get diarrhea or mouthsores. Do not treat yourself. To protect your kidneys, drink water or other fluids as directed while you aretaking this medicine. Do not become pregnant while taking this medicine or for 6 months after stopping it. Women should inform their doctor if they wish to become pregnant or think they might be pregnant. Men should not father a child while taking this medicine and for 3 months after stopping it. This may interfere with the ability to father a child. You should talk to your doctor or health care professional if you are concerned about your fertility. There is a potential for serious side effects to an  unborn child. Talk to your health care professional or pharmacist for more information. Do not breast-feed an infantwhile taking this medicine or for 1 week after stopping it. What side effects may I notice from receiving this medication? Side effects that you should report to your doctor or health care professionalas soon as possible: allergic reactions like skin rash, itching or hives, swelling of the face, lips, or tongue breathing problems redness, blistering, peeling or loosening of the skin, including inside the mouth signs and symptoms of bleeding such as bloody or black, tarry stools; red or dark-brown urine; spitting up blood or brown material that looks like coffee grounds; red spots on the skin; unusual bruising or bleeding from the eye, gums, or nose signs and symptoms of infection like fever or chills; cough; sore throat; pain or trouble passing urine signs and symptoms of kidney injury like trouble passing urine or change in the amount of urine signs and symptoms of liver injury like dark yellow or brown urine; general ill feeling or flu-like symptoms; light-colored stools; loss of appetite; nausea; right upper belly pain; unusually weak or tired; yellowing of the eyes or skin Side effects that usually do not require medical attention (report to yourdoctor or health care professional if they continue or are bothersome): constipation mouth sores nausea, vomiting unusually weak or tired This list may not describe all possible side effects. Call your doctor for  medical advice about side effects. You may report side effects to FDA at1-800-FDA-1088. Where should I keep my medication? This drug is given in a hospital or clinic and will not be stored at home. NOTE: This sheet is a summary. It may not cover all possible information. If you have questions about this medicine, talk to your doctor, pharmacist, orhealth care provider.  2022 Elsevier/Gold Standard (2018-01-22 16:11:33)  Should  you have questions after your visit or need to cancel or reschedule your appointment, please contact [facility]  [phone] and follow the prompts.  Office hours are 8:00 a.m. to 4:30 p.m. Monday - Friday. Please note that voicemails left after 4:00 p.m. may not be returned until the following business day.  We are closed weekends and major holidays. You have access to a nurse at all times for urgent questions. Please call the main number to the clinic [phone] and follow the prompts.  For any non-urgent questions, you may also contact your provider using MyChart. We now offer e-Visits for anyone 40 and older to request care online for non-urgent symptoms. For details visit [email or web address].   Also download the MyChart app! Go to the app store, search "MyChart", open the app, select [facility], and log in with your MyChart username and password.  Due to Covid, a mask is required upon entering the hospital/clinic. If you do not have a mask, one will be given to you upon arrival. For doctor visits, patients may have 1 support person aged 40 or older with them. For treatment visits, patients cannot have anyone with them due to current Covid guidelines and our immunocompromised population.    Bevacizumab injection What is this medication? BEVACIZUMAB (be va SIZ yoo mab) is a monoclonal antibody. It is used to treatmany types of cancer. This medicine may be used for other purposes; ask your health care provider orpharmacist if you have questions. COMMON BRAND NAME(S): Avastin, MVASI, Noah Charon What should I tell my care team before I take this medication? They need to know if you have any of these conditions: diabetes heart disease high blood pressure history of coughing up blood prior anthracycline chemotherapy (e.g., doxorubicin, daunorubicin, epirubicin) recent or ongoing radiation therapy recent or planning to have surgery stroke an unusual or  allergic reaction to bevacizumab, hamster proteins, mouse proteins, other medicines, foods, dyes, or preservatives pregnant or trying to get pregnant breast-feeding How should I use this medication? This medicine is for infusion into a vein. It is given by a health careprofessional in a hospital or clinic setting. Talk to your pediatrician regarding the use of this medicine in children.Special care may be needed. Overdosage: If you think you have taken too much of this medicine contact apoison control center or emergency room at once. NOTE: This medicine is only for you. Do not share this medicine with others. What if I miss a dose? It is important not to miss your dose. Call your doctor or health careprofessional if you are unable to keep an appointment. What may interact with this medication? Interactions are not expected. This list may not describe all possible interactions. Give your health care provider a list of all the medicines, herbs, non-prescription drugs, or dietary supplements you use. Also tell them if you smoke, drink alcohol, or use illegaldrugs. Some items may interact with your medicine. What should I watch for while using this medication? Your condition will be monitored carefully while you are receiving this medicine. You will need important blood  work and urine testing done while youare taking this medicine. This medicine may increase your risk to bruise or bleed. Call your doctor orhealth care professional if you notice any unusual bleeding. Before having surgery, talk to your health care provider to make sure it is ok. This drug can increase the risk of poor healing of your surgical site or wound. You will need to stop this drug for 28 days before surgery. After surgery, wait at least 28 days before restarting this drug. Make sure the surgical site or wound is healed enough before restarting this drug. Talk to your health careprovider if questions. Do not become pregnant while  taking this medicine or for 6 months after stopping it. Women should inform their doctor if they wish to become pregnant or think they might be pregnant. There is a potential for serious side effects to an unborn child. Talk to your health care professional or pharmacist for more information. Do not breast-feed an infant while taking this medicine andfor 6 months after the last dose. This medicine has caused ovarian failure in some women. This medicine may interfere with the ability to have a child. You should talk to your doctor orhealth care professional if you are concerned about your fertility. What side effects may I notice from receiving this medication? Side effects that you should report to your doctor or health care professionalas soon as possible: allergic reactions like skin rash, itching or hives, swelling of the face, lips, or tongue chest pain or chest tightness chills coughing up blood high fever seizures severe constipation signs and symptoms of bleeding such as bloody or black, tarry stools; red or dark-brown urine; spitting up blood or brown material that looks like coffee grounds; red spots on the skin; unusual bruising or bleeding from the eye, gums, or nose signs and symptoms of a blood clot such as breathing problems; chest pain; severe, sudden headache; pain, swelling, warmth in the leg signs and symptoms of a stroke like changes in vision; confusion; trouble speaking or understanding; severe headaches; sudden numbness or weakness of the face, arm or leg; trouble walking; dizziness; loss of balance or coordination stomach pain sweating swelling of legs or ankles vomiting weight gain Side effects that usually do not require medical attention (report to yourdoctor or health care professional if they continue or are bothersome): back pain changes in taste decreased appetite dry skin nausea tiredness This list may not describe all possible side effects. Call your doctor  for medical advice about side effects. You may report side effects to FDA at1-800-FDA-1088. Where should I keep my medication? This drug is given in a hospital or clinic and will not be stored at home. NOTE: This sheet is a summary. It may not cover all possible information. If you have questions about this medicine, talk to your doctor, pharmacist, orhealth care provider.  2022 Elsevier/Gold Standard (2019-09-30 10:50:46)

## 2021-06-16 NOTE — Progress Notes (Signed)
Pt here for poss chemotherapy today. Reports cough is much better. Gained 2 lbs this week.

## 2021-06-16 NOTE — Progress Notes (Signed)
Hematology/Oncology Follow Up Note [facility]  [phone] [phone]  Patient Care Team: Ranae Plumber, [place] as PCP - General (Family Medicine) Telford Nab, RN as Oncology Nurse Navigator [name], MD as Medical Oncologist (Oncology)   Name of the patient: Brittney Fisher  [id number]  1961/09/07   REASON FOR VISIT  follow-up for lung cancer  PERTINENT ONCOLOGY HISTORY 60 y.o. female with past medical history including GERD, COPD, current everyday smoker presents for follow-up of lung mass and pleural effusion. 10/20/2020, patient was brought to ED via EMS due to generalized weakness, dizziness, chest pain shortness of breath.  She was recently diagnosed with COPD approximately 1 month ago by primary care provider.  Also unintentional weight loss during the last few months. Image work-up showed right-sided pleural effusion.  She underwent right thoracentesis.  With removal of 2.4 L of fluid. 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  Metastatic disease to low cervical/thoracic nodes, lung, right pleural space and bones.  Moderate to large right pleural effusion.  SVC narrowing. 10/21/2020 MRI brain is negative for metastasis.  Mild chronic microvascular ischemic changes in the white matter.  Negative for acute infarct Regarding to the SVC narrowing, patient was seen by vascular surgeon and was recommended no intervention inpatient.  Patient to follow-up outpatient with vascular surgeon for evaluation. Patient also was treated for COPD exacerbation with hypoxia on exertion. Qualifies for home oxygen and hospitalist arrange home health and a nebulizer.  Patient was given a course of prednisone taper and empiric Levaquin. Patient was discharged and present today to follow-up with cytology results and further management plan  10/27/2020 PET scan showed right  upper lobe primary bronchogenic mass with direct invasion into mediastinum. Metastatic disease to the right pleural space, lung, bone, nodes of the chest and less so lower leg/upper abdomen. Hypermetabolic him along the course of the right axillary vein with concurrent subtle hyper attenuation within the axillary vein and SVC.  This continues to the level of SVC narrowing.  Suspicious for SVC occlusion and developing thrombus. Moderate right pleural effusion and small pericardial effusion.  # # left supraclavicular mass biopsy- pathology is positive for adenocarcinoma. positive for CK7, with patchy, weak CK20.  They are  negative for TTF1, NapsinA, GATA3, CDX2, and Pax8.  The findings are  nonspecific; but may be compatible with a poorly differentiated  adenocarcinoma of lung origin, especially given the imaging findings  She also had another repeat thoracentesis and fluid cytology showed malignancy.  #NGS showed AXIN 1 [id number], DIS2 M150f, KRAS G12C, PRDM1 M1 [id number], [id number] MS stable, TPS <1%   11/14/2020- 12/12/2020.  #Bronchial obstruction and SVC occlusion.  s/p palliative radiation #After 2 cycles of chemtherapy, patient had CT scan done due to shortness of breath.  01/05/2021, CT chest angiogram PE protocol showed no evidence of pulmonary embolism.  Stable disease.  Right upper lobe mass about 6.7 x 5.3 cm.  Widespread metastatic disease, lung and bone involvement. Interval development of right-sided hydronephrosis. 03/06/2021 finished T2 palliative radiation 03/22/2021, 6 cycles of carboplatin/Alimta/bevacizumab.  Infusion reaction of carboplatin during cycle 6.  Carboplatin discontinued. 04/07/2021 CT chest abdomen pelvis-partial response Mild decrease in size of right lung mass, mildly decreased mediastinal and hilar adenopathy.  Extensive right-sided pleural metastasis unchanged.  Similar appearance of diffuse bilateral pulmonary nodules.  Multifocal lytic sclerotic bone  metastasis.  New superior endplate deformity of T6  INTERVAL HISTORY Brittney Detert  Fisher is a 60 y.o. female who has above history reviewed by me today presents for follow up visit for management of metastatic lung cancer, acute visit for swallowing pain.  Problems and complaints are listed below Patient reports feeling well.  During interval patient was seen by symptom management for allergic rhinitis.  Symptoms have improved. Denies shortness of breath, cough, chest pain.  Appetite is good.  She has gained 2 pounds since his visit 3 weeks ago.   Review of Systems  Constitutional:  Negative for chills, fatigue, fever and unexpected weight change.  HENT:   Negative for hearing loss and voice change.   Eyes:  Negative for eye problems.  Respiratory:  Negative for chest tightness, cough and shortness of breath.   Cardiovascular:  Negative for chest pain.  Gastrointestinal:  Negative for abdominal distention, abdominal pain and blood in stool.  Endocrine: Negative for hot flashes.  Genitourinary:  Negative for difficulty urinating and frequency.   Musculoskeletal:  Negative for arthralgias.  Skin:  Negative for itching and rash.  Neurological:  Negative for extremity weakness.  Hematological:  Negative for adenopathy.  Psychiatric/Behavioral:  Negative for confusion.      Allergies  Allergen Reactions   Carboplatin Shortness Of Breath, Itching and Cough     Past Medical History:  Diagnosis Date   COPD (chronic obstructive pulmonary disease) (HCC)    GERD (gastroesophageal reflux disease)    Metastatic lung cancer (metastasis from lung to other site) ([place]) 11/02/2020     Past Surgical History:  Procedure Laterality Date   COLONOSCOPY WITH ESOPHAGOGASTRODUODENOSCOPY (EGD)     COLONOSCOPY WITH PROPOFOL N/A 11/14/2015   Procedure: COLONOSCOPY WITH PROPOFOL;  Surgeon: Hulen Luster, MD;  Location: [facility] ENDOSCOPY;  Service: Gastroenterology;  Laterality: N/A;   ESOPHAGOGASTRODUODENOSCOPY  (EGD) WITH PROPOFOL N/A 11/14/2015   Procedure: ESOPHAGOGASTRODUODENOSCOPY (EGD) WITH PROPOFOL;  Surgeon: Hulen Luster, MD;  Location: [facility] ENDOSCOPY;  Service: Gastroenterology;  Laterality: N/A;    Social History   Socioeconomic History   Marital status: Married    Spouse name: Not on file   Number of children: Not on file   Years of education: Not on file   Highest education level: Not on file  Occupational History   Not on file  Tobacco Use   Smoking status: Every Day    Packs/day: 0.20    Pack years: 0.00    Types: Cigarettes   Smokeless tobacco: Never  Substance and Sexual Activity   Alcohol use: Never   Drug use: Never   Sexual activity: Not Currently  Other Topics Concern   Not on file  Social History Narrative   Not on file   Social Determinants of Health   Financial Resource Strain: Not on file  Food Insecurity: Not on file  Transportation Needs: Not on file  Physical Activity: Not on file  Stress: Not on file  Social Connections: Not on file  Intimate Partner Violence: Not on file    Family History  Problem Relation Age of Onset   Breast cancer Cousin 80   Diabetes type II Mother    Hypertension Mother    Colon cancer Father    Hypertension Father      Current Outpatient Medications:    acetaminophen (TYLENOL) 500 MG tablet, Take 1,000 mg by mouth every 8 (eight) hours as needed for moderate pain. , Disp: , Rfl:    ALPRAZolam (XANAX) 0.5 MG tablet, Take 1 tablet (0.5 mg total) by mouth See admin instructions.  Take 1 tablet 30 minutes prior to MRI procedure, may take another tablet if needed., Disp: 2 tablet, Rfl: 0   dexamethasone (DECADRON) 4 MG tablet, Take 1 tab (4 mg) twice a day the day before Alimta chemo. Take 2 tablets (8 mg) po daily x 3 days starting the day after chemo., Disp: 30 tablet, Rfl: 1   folic acid (FOLVITE) 1 MG tablet, Take 1 tablet (1 mg total) by mouth daily. Start 5-7 days before Alimta chemotherapy. Continue until 21 days after  Alimta completed., Disp: 100 tablet, Rfl: 3   guaiFENesin (MUCINEX) 600 MG 12 hr tablet, Take 1 tablet (600 mg total) by mouth 2 (two) times daily., Disp: 14 tablet, Rfl: 0   ipratropium-albuterol (DUONEB) 0.5-2.5 (3) MG/3ML SOLN, Take 3 mLs by nebulization 3 (three) times daily., Disp: 360 mL, Rfl: 1   magic mouthwash w/lidocaine SOLN, Take 5 mLs by mouth 4 (four) times daily as needed for mouth pain. Sig: Swish/Swallow 5-10 ml four times a day as needed. Dispense 480 ml. 1RF, Disp: 480 mL, Rfl: 1   omeprazole (PRILOSEC) 20 MG capsule, Take 1 capsule (20 mg total) by mouth 2 (two) times daily before a meal., Disp: 60 capsule, Rfl: 1   oxyCODONE (OXY IR/ROXICODONE) 5 MG immediate release tablet, Take 1 tablet (5 mg total) by mouth every 6 (six) hours as needed for moderate pain or severe pain., Disp: 60 tablet, Rfl: 0   Polyethylene Glycol 3350 (MIRALAX PO), Take by mouth daily as needed., Disp: , Rfl:    prochlorperazine (COMPAZINE) 10 MG tablet, Take 1 tablet (10 mg total) by mouth every 6 (six) hours as needed (Nausea or vomiting)., Disp: 30 tablet, Rfl: 1   protein supplement shake (PREMIER PROTEIN) LIQD, Take 2 oz by mouth 4 (four) times daily., Disp: , Rfl:    senna-docusate (SENOKOT-S) 8.6-50 MG tablet, Take 2 tablets by mouth daily., Disp: 60 tablet, Rfl: 3   sucralfate (CARAFATE) 1 GM/10ML suspension, Take 10 mLs (1 g total) by mouth in the morning, at noon, and at bedtime., Disp: 420 mL, Rfl: 0   tiotropium (SPIRIVA HANDIHALER) 18 MCG inhalation capsule, Place 18 mcg into inhaler and inhale daily., Disp: , Rfl:    benzonatate (TESSALON) 100 MG capsule, Take 1 capsule (100 mg total) by mouth 3 (three) times daily as needed for cough., Disp: 60 capsule, Rfl: 1   ondansetron (ZOFRAN) 8 MG tablet, Take 1 tablet (8 mg total) by mouth 2 (two) times daily as needed for refractory nausea / vomiting. Start on day 3 after chemo. (Patient not taking: Reported on 06/16/2021), Disp: 30 tablet, Rfl:  1  Physical exam: ECOG 1 Vitals:   06/16/21 0854  BP: 128/90  Pulse: 99  Resp: 18  Temp: 98.3 F (36.8 C)  TempSrc: Tympanic  SpO2: 100%  Weight: 155 lb (70.3 kg)  Height: _0  (1.803 m)   Physical Exam Constitutional:      General: She is not in acute distress.    Comments: Thin built female walks independently  HENT:     Head: Normocephalic and atraumatic.  Eyes:     General: No scleral icterus. Cardiovascular:     Rate and Rhythm: Normal rate and regular rhythm.     Heart sounds: Normal heart sounds.  Pulmonary:     Effort: Pulmonary effort is normal. No respiratory distress.     Breath sounds: No wheezing.     Comments: Slightly decreased breath sound bilaterally.  Improved Abdominal:  General: Bowel sounds are normal. There is no distension.     Palpations: Abdomen is soft.  Musculoskeletal:        General: No deformity. Normal range of motion.     Cervical back: Normal range of motion and neck supple.  Skin:    General: Skin is warm and dry.     Findings: No erythema or rash.  Neurological:     Mental Status: She is alert and oriented to person, place, and time. Mental status is at baseline.     Cranial Nerves: No cranial nerve deficit.     Coordination: Coordination normal.  Psychiatric:        Mood and Affect: Mood normal.    CMP Latest Ref Rng & Units 06/16/2021  Glucose 70 - 99 mg/dL 80  BUN 8 - 23 mg/dL 20  Creatinine 0.44 - 1.00 mg/dL 0.66  Sodium 135 - 145 mmol/L 139  Potassium 3.5 - 5.1 mmol/L 3.7  Chloride 98 - 111 mmol/L 104  CO2 22 - 32 mmol/L 25  Calcium 8.9 - 10.3 mg/dL 8.9  Total Protein 6.5 - 8.1 g/dL 8.1  Total Bilirubin 0.3 - 1.2 mg/dL 0.4  Alkaline Phos 38 - 126 U/L 71  AST 15 - 41 U/L 30  ALT 0 - 44 U/L 20   CBC Latest Ref Rng & Units 06/16/2021  WBC 4.0 - 10.5 K/uL 6.5  Hemoglobin 12.0 - 15.0 g/dL 12.3  Hematocrit 36.0 - 46.0 % 39.1  Platelets 150 - 400 K/uL 283    RADIOGRAPHIC STUDIES: I have personally reviewed the  radiological images as listed and agreed with the findings in the report. No results found.    Assessment and plan  1. Primary malignant neoplasm of lung metastatic to other site, unspecified laterality ([place])   2. Bone metastasis ([place])   3. Neoplasm related pain   4. Encounter for antineoplastic chemotherapy   5. 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  cT4 N3 M1-  KRASG12C (Lumakras in subsequent line can be considered) First-line treatment with carboplatin/Alimta/bevacizumab.[cycle 6 carboplatin hypersensitivity-discontinue] 04/07/2021 CT Partial response.  Labs reviewed and discussed with patient. Proceed with Alimta and bevacizumab maintenance.   Obtain CT chest abdomen pelvis prior to next visit.  #Bone metastasis.  Status post T2 radiation.  Patient declined bisphosphonate  #Neoplasm related pain/treatment complication-pain appears to be stable. Pain has subsided.   I repeated brain MRI with and without contrast for surveillance.- xanax PRN for claustrophobia Patient prefers to defer MRI to August 2022.   Lab MD Alimta, bevacizumab, 3 weeks.  [name], MD, PhD Hematology Oncology [facility] Pager- [phone] 06/16/2021

## 2021-07-04 ENCOUNTER — Other Ambulatory Visit: Payer: Self-pay

## 2021-07-04 ENCOUNTER — Ambulatory Visit
Admission: RE | Admit: 2021-07-04 | Discharge: 2021-07-04 | Disposition: A | Discharge status: Home / Self Care | Payer: BC Managed Care – PPO | Source: Ambulatory Visit | Attending: Oncology | Admitting: Oncology

## 2021-07-04 DIAGNOSIS — C3490 Malignant neoplasm of unspecified part of unspecified bronchus or lung: Secondary | ICD-10-CM

## 2021-07-04 MED ORDER — IOHEXOL 350 MG/ML SOLN
75.0000 mL | Freq: Once | INTRAVENOUS | Status: AC | PRN
  Administered 2021-07-04: 75 mL via INTRAVENOUS

## 2021-07-07 ENCOUNTER — Encounter: Payer: Self-pay | Admitting: Oncology

## 2021-07-07 ENCOUNTER — Inpatient Hospital Stay: Payer: BC Managed Care – PPO

## 2021-07-07 ENCOUNTER — Inpatient Hospital Stay (HOSPITAL_BASED_OUTPATIENT_CLINIC_OR_DEPARTMENT_OTHER): Payer: BC Managed Care – PPO | Admitting: Oncology

## 2021-07-07 VITALS — BP 119/76 | HR 94 | Temp 98.0°F | Resp 18 | Wt 159.5 lb

## 2021-07-07 DIAGNOSIS — Z5112 Encounter for antineoplastic immunotherapy: Secondary | ICD-10-CM | POA: Diagnosis not present

## 2021-07-07 DIAGNOSIS — C7951 Secondary malignant neoplasm of bone: Secondary | ICD-10-CM

## 2021-07-07 DIAGNOSIS — C3490 Malignant neoplasm of unspecified part of unspecified bronchus or lung: Secondary | ICD-10-CM

## 2021-07-07 DIAGNOSIS — Z5111 Encounter for antineoplastic chemotherapy: Secondary | ICD-10-CM

## 2021-07-07 DIAGNOSIS — G893 Neoplasm related pain (acute) (chronic): Secondary | ICD-10-CM

## 2021-07-07 LAB — CBC WITH DIFFERENTIAL/PLATELET
Abs Immature Granulocytes: 0.04 10*3/uL (ref 0.00–0.07)
Basophils Absolute: 0 10*3/uL (ref 0.0–0.1)
Basophils Relative: 0 %
Eosinophils Absolute: 0.1 10*3/uL (ref 0.0–0.5)
Eosinophils Relative: 1 %
HCT: 36.7 % (ref 36.0–46.0)
Hemoglobin: 11.6 g/dL — ABNORMAL LOW (ref 12.0–15.0)
Immature Granulocytes: 1 %
Lymphocytes Relative: 13 %
Lymphs Abs: 1 10*3/uL (ref 0.7–4.0)
MCH: 27.3 pg (ref 26.0–34.0)
MCHC: 31.6 g/dL (ref 30.0–36.0)
MCV: 86.4 fL (ref 80.0–100.0)
Monocytes Absolute: 0.9 10*3/uL (ref 0.1–1.0)
Monocytes Relative: 11 %
Neutro Abs: 5.7 10*3/uL (ref 1.7–7.7)
Neutrophils Relative %: 74 %
Platelets: 268 10*3/uL (ref 150–400)
RBC: 4.25 MIL/uL (ref 3.87–5.11)
RDW: 16.5 % — ABNORMAL HIGH (ref 11.5–15.5)
WBC: 7.7 10*3/uL (ref 4.0–10.5)
nRBC: 0 % (ref 0.0–0.2)

## 2021-07-07 LAB — COMPREHENSIVE METABOLIC PANEL WITH GFR
ALT: 23 U/L (ref 0–44)
AST: 28 U/L (ref 15–41)
Albumin: 3.2 g/dL — ABNORMAL LOW (ref 3.5–5.0)
Alkaline Phosphatase: 92 U/L (ref 38–126)
Anion gap: 10 (ref 5–15)
BUN: 25 mg/dL — ABNORMAL HIGH (ref 8–23)
CO2: 23 mmol/L (ref 22–32)
Calcium: 8.9 mg/dL (ref 8.9–10.3)
Chloride: 105 mmol/L (ref 98–111)
Creatinine, Ser: 0.97 mg/dL (ref 0.44–1.00)
GFR, Estimated: >60 mL/min
Glucose, Bld: 91 mg/dL (ref 70–99)
Potassium: 3.9 mmol/L (ref 3.5–5.1)
Sodium: 138 mmol/L (ref 135–145)
Total Bilirubin: 0.5 mg/dL (ref 0.3–1.2)
Total Protein: 7.8 g/dL (ref 6.5–8.1)

## 2021-07-07 LAB — PROTEIN, URINE, RANDOM: Total Protein, Urine: 7 mg/dL

## 2021-07-07 MED ORDER — SODIUM CHLORIDE 0.9 % IV SOLN
1000.0000 mg | Freq: Once | INTRAVENOUS | Status: AC
  Administered 2021-07-07: 1000 mg via INTRAVENOUS
  Filled 2021-07-07: qty 32

## 2021-07-07 MED ORDER — PROCHLORPERAZINE MALEATE 10 MG PO TABS
10.0000 mg | ORAL_TABLET | Freq: Once | ORAL | Status: AC
  Administered 2021-07-07: 10 mg via ORAL
  Filled 2021-07-07: qty 1

## 2021-07-07 MED ORDER — SODIUM CHLORIDE 0.9 % IV SOLN
Freq: Once | INTRAVENOUS | Status: AC
  Filled 2021-07-07: qty 250

## 2021-07-07 MED ORDER — HEPARIN SOD (PORK) LOCK FLUSH 100 UNIT/ML IV SOLN
INTRAVENOUS | Status: AC
  Filled 2021-07-07: qty 5

## 2021-07-07 MED ORDER — SODIUM CHLORIDE 0.9 % IV SOLN
500.0000 mg/m2 | Freq: Once | INTRAVENOUS | Status: AC
  Administered 2021-07-07: 900 mg via INTRAVENOUS
  Filled 2021-07-07: qty 20

## 2021-07-07 MED ORDER — HEPARIN SOD (PORK) LOCK FLUSH 100 UNIT/ML IV SOLN
500.0000 [IU] | Freq: Once | INTRAVENOUS | Status: DC | PRN
  Filled 2021-07-07: qty 5

## 2021-07-07 MED ORDER — SODIUM CHLORIDE 0.9% FLUSH
10.0000 mL | INTRAVENOUS | Status: DC | PRN
  Administered 2021-07-07: 10 mL
  Filled 2021-07-07: qty 10

## 2021-07-07 NOTE — Progress Notes (Signed)
Patient has occasional right leg pain at night.

## 2021-07-07 NOTE — Progress Notes (Signed)
Hematology/Oncology Follow Up Note [facility]  Telephone:([phone] [phone]  Patient Care Team: Telford Nab, RN as Oncology Nurse Navigator [name], MD as Medical Oncologist (Oncology)   Name of the patient: Brittney Fisher  [id number]  1961/07/12   REASON FOR VISIT  follow-up for lung cancer  PERTINENT ONCOLOGY HISTORY 60 y.o. female with past medical history including GERD, COPD, current everyday smoker presents for follow-up of lung mass and pleural effusion. 10/20/2020, patient was brought to ED via EMS due to generalized weakness, dizziness, chest pain shortness of breath.  She was recently diagnosed with COPD approximately 1 month ago by primary care provider.  Also unintentional weight loss during the last few months. Image work-up showed right-sided pleural effusion.  She underwent right thoracentesis.  With removal of 2.4 L of fluid. 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  Metastatic disease to low cervical/thoracic nodes, lung, right pleural space and bones.  Moderate to large right pleural effusion.  SVC narrowing. 10/21/2020 MRI brain is negative for metastasis.  Mild chronic microvascular ischemic changes in the white matter.  Negative for acute infarct Regarding to the SVC narrowing, patient was seen by vascular surgeon and was recommended no intervention inpatient.  Patient to follow-up outpatient with vascular surgeon for evaluation. Patient also was treated for COPD exacerbation with hypoxia on exertion. Qualifies for home oxygen and hospitalist arrange home health and a nebulizer.  Patient was given a course of prednisone taper and empiric Levaquin. Patient was discharged and present today to follow-up with cytology results and further management plan  10/27/2020 PET scan showed right upper lobe primary bronchogenic mass with direct invasion  into mediastinum. Metastatic disease to the right pleural space, lung, bone, nodes of the chest and less so lower leg/upper abdomen. Hypermetabolic him along the course of the right axillary vein with concurrent subtle hyper attenuation within the axillary vein and SVC.  This continues to the level of SVC narrowing.  Suspicious for SVC occlusion and developing thrombus. Moderate right pleural effusion and small pericardial effusion.  # # left supraclavicular mass biopsy- pathology is positive for adenocarcinoma. positive for CK7, with patchy, weak CK20.  They are  negative for TTF1, NapsinA, GATA3, CDX2, and Pax8.  The findings are  nonspecific; but may be compatible with a poorly differentiated  adenocarcinoma of lung origin, especially given the imaging findings  She also had another repeat thoracentesis and fluid cytology showed malignancy.  #NGS showed AXIN 1 [id number], DIS2 M153f, KRAS G12C, PRDM1 M1 [id number], [id number] MS stable, TPS <1%   11/14/2020- 12/12/2020.  #Bronchial obstruction and SVC occlusion.  s/p palliative radiation #After 2 cycles of chemtherapy, patient had CT scan done due to shortness of breath.  01/05/2021, CT chest angiogram PE protocol showed no evidence of pulmonary embolism.  Stable disease.  Right upper lobe mass about 6.7 x 5.3 cm.  Widespread metastatic disease, lung and bone involvement. Interval development of right-sided hydronephrosis. 03/06/2021 finished T2 palliative radiation 03/22/2021, 6 cycles of carboplatin/Alimta/bevacizumab.  Infusion reaction of carboplatin during cycle 6.  Carboplatin discontinued. 04/07/2021 CT chest abdomen pelvis-partial response Mild decrease in size of right lung mass, mildly decreased mediastinal and hilar adenopathy.  Extensive right-sided pleural metastasis unchanged.  Similar appearance of diffuse bilateral pulmonary nodules.  Multifocal lytic sclerotic bone metastasis.  New superior endplate deformity of T6  INTERVAL  HISTORY Brittney BRANDELis a 60y.o. female who has above  history reviewed by me today presents for follow up visit for management of metastatic lung cancer, acute visit for swallowing pain.  Problems and complaints are listed below She feels well today gained 4 pounds. She was accompanied by her daughter.  Occasionally she has lower extremity pain at night and she massages and take a pain pill which relieves her symptoms.  She also had a vivid dream on night  Denies shortness of breath, cough, chest pain.  Appetite is good.    Review of Systems  Constitutional:  Negative for chills, fatigue, fever and unexpected weight change.  HENT:   Negative for hearing loss and voice change.   Eyes:  Negative for eye problems.  Respiratory:  Negative for chest tightness, cough and shortness of breath.   Cardiovascular:  Negative for chest pain.  Gastrointestinal:  Negative for abdominal distention, abdominal pain and blood in stool.  Endocrine: Negative for hot flashes.  Genitourinary:  Negative for difficulty urinating and frequency.   Musculoskeletal:  Negative for arthralgias.  Skin:  Negative for itching and rash.  Neurological:  Negative for extremity weakness.  Hematological:  Negative for adenopathy.  Psychiatric/Behavioral:  Negative for confusion.      Allergies  Allergen Reactions   Carboplatin Shortness Of Breath, Itching and Cough     Past Medical History:  Diagnosis Date   COPD (chronic obstructive pulmonary disease) (HCC)    GERD (gastroesophageal reflux disease)    Metastatic lung cancer (metastasis from lung to other site) ([place]) 11/02/2020     Past Surgical History:  Procedure Laterality Date   COLONOSCOPY WITH ESOPHAGOGASTRODUODENOSCOPY (EGD)     COLONOSCOPY WITH PROPOFOL N/A 11/14/2015   Procedure: COLONOSCOPY WITH PROPOFOL;  Surgeon: Hulen Luster, MD;  [facility] ENDOSCOPY;  Service: Gastroenterology;  Laterality: N/A;   ESOPHAGOGASTRODUODENOSCOPY (EGD) WITH  PROPOFOL N/A 11/14/2015   Procedure: ESOPHAGOGASTRODUODENOSCOPY (EGD) WITH PROPOFOL;  Surgeon: Hulen Luster, MD;  Location: [facility] ENDOSCOPY;  Service: Gastroenterology;  Laterality: N/A;    Social History   Socioeconomic History   Marital status: Married    Spouse name: Not on file   Number of children: Not on file   Years of education: Not on file   Highest education level: Not on file  Occupational History   Not on file  Tobacco Use   Smoking status: Every Day    Packs/day: 0.20    Types: Cigarettes   Smokeless tobacco: Never  Substance and Sexual Activity   Alcohol use: Never   Drug use: Never   Sexual activity: Not Currently  Other Topics Concern   Not on file  Social History Narrative   Not on file   Social Determinants of Health   Financial Resource Strain: Not on file  Food Insecurity: Not on file  Transportation Needs: Not on file  Physical Activity: Not on file  Stress: Not on file  Social Connections: Not on file  Intimate Partner Violence: Not on file    Family History  Problem Relation Age of Onset   Breast cancer Cousin 67   Diabetes type II Mother    Hypertension Mother    Colon cancer Father    Hypertension Father      Current Outpatient Medications:    acetaminophen (TYLENOL) 500 MG tablet, Take 1,000 mg by mouth every 8 (eight) hours as needed for moderate pain. , Disp: , Rfl:    ALPRAZolam (XANAX) 0.5 MG tablet, Take 1 tablet (0.5 mg total) by mouth See admin instructions. Take 1  tablet 30 minutes prior to MRI procedure, may take another tablet if needed., Disp: 2 tablet, Rfl: 0   benzonatate (TESSALON) 100 MG capsule, Take 1 capsule (100 mg total) by mouth 3 (three) times daily as needed for cough., Disp: 60 capsule, Rfl: 1   dexamethasone (DECADRON) 4 MG tablet, Take 1 tab (4 mg) twice a day the day before Alimta chemo. Take 2 tablets (8 mg) po daily x 3 days starting the day after chemo., Disp: 30 tablet, Rfl: 1   folic acid (FOLVITE) 1 MG  tablet, Take 1 tablet (1 mg total) by mouth daily. Start 5-7 days before Alimta chemotherapy. Continue until 21 days after Alimta completed., Disp: 100 tablet, Rfl: 3   guaiFENesin (MUCINEX) 600 MG 12 hr tablet, Take 1 tablet (600 mg total) by mouth 2 (two) times daily., Disp: 14 tablet, Rfl: 0   ipratropium-albuterol (DUONEB) 0.5-2.5 (3) MG/3ML SOLN, Take 3 mLs by nebulization 3 (three) times daily., Disp: 360 mL, Rfl: 1   magic mouthwash w/lidocaine SOLN, Take 5 mLs by mouth 4 (four) times daily as needed for mouth pain. Sig: Swish/Swallow 5-10 ml four times a day as needed. Dispense 480 ml. 1RF, Disp: 480 mL, Rfl: 1   omeprazole (PRILOSEC) 20 MG capsule, Take 1 capsule (20 mg total) by mouth 2 (two) times daily before a meal., Disp: 60 capsule, Rfl: 1   oxyCODONE (OXY IR/ROXICODONE) 5 MG immediate release tablet, Take 1 tablet (5 mg total) by mouth every 6 (six) hours as needed for moderate pain or severe pain., Disp: 60 tablet, Rfl: 0   Polyethylene Glycol 3350 (MIRALAX PO), Take by mouth daily as needed., Disp: , Rfl:    prochlorperazine (COMPAZINE) 10 MG tablet, Take 1 tablet (10 mg total) by mouth every 6 (six) hours as needed (Nausea or vomiting)., Disp: 30 tablet, Rfl: 1   protein supplement shake (PREMIER PROTEIN) LIQD, Take 2 oz by mouth 4 (four) times daily., Disp: , Rfl:    senna-docusate (SENOKOT-S) 8.6-50 MG tablet, Take 2 tablets by mouth daily., Disp: 60 tablet, Rfl: 3   sucralfate (CARAFATE) 1 GM/10ML suspension, Take 10 mLs (1 g total) by mouth in the morning, at noon, and at bedtime. (Patient taking differently: Take 1 g by mouth 3 (three) times daily as needed.), Disp: 420 mL, Rfl: 0   tiotropium (SPIRIVA HANDIHALER) 18 MCG inhalation capsule, Place 18 mcg into inhaler and inhale daily., Disp: , Rfl:    ondansetron (ZOFRAN) 8 MG tablet, Take 1 tablet (8 mg total) by mouth 2 (two) times daily as needed for refractory nausea / vomiting. Start on day 3 after chemo. (Patient not taking:  No sig reported), Disp: 30 tablet, Rfl: 1 No current facility-administered medications for this visit.  Facility-Administered Medications Ordered in Other Visits:    heparin lock flush 100 unit/mL, 500 Units, Intracatheter, Once PRN, [name], MD   PEMEtrexed (ALIMTA) 900 mg in sodium chloride 0.9 % 100 mL chemo infusion, 500 mg/m2 (Order-Specific), Intravenous, Once, [name], MD, Last Rate: 816 mL/hr at 07/07/21 1008, 900 mg at 07/07/21 1008   sodium chloride flush (NS) 0.9 % injection 10 mL, 10 mL, Intracatheter, PRN, [name], MD, 10 mL at 07/07/21 0940  Physical exam: ECOG 1 Vitals:   07/07/21 0855  BP: 119/76  Pulse: 94  Resp: 18  Temp: 98 F (36.7 C)  Weight: 159 lb 8 oz (72.3 kg)   Physical Exam Constitutional:      General: She is not in acute  distress.    Comments: Thin built female walks independently  HENT:     Head: Normocephalic and atraumatic.  Eyes:     General: No scleral icterus. Cardiovascular:     Rate and Rhythm: Normal rate and regular rhythm.     Heart sounds: Normal heart sounds.  Pulmonary:     Effort: Pulmonary effort is normal. No respiratory distress.     Breath sounds: No wheezing.     Comments: Slightly decreased breath sound bilaterally.  Abdominal:     General: Bowel sounds are normal. There is no distension.     Palpations: Abdomen is soft.  Musculoskeletal:        General: No deformity. Normal range of motion.     Cervical back: Normal range of motion and neck supple.  Skin:    General: Skin is warm and dry.     Findings: No erythema or rash.  Neurological:     Mental Status: She is alert and oriented to person, place, and time. Mental status is at baseline.     Cranial Nerves: No cranial nerve deficit.     Coordination: Coordination normal.  Psychiatric:        Mood and Affect: Mood normal.    CMP Latest Ref Rng & Units 07/07/2021  Glucose 70 - 99 mg/dL 91  BUN 8 - 23 mg/dL 25(H)  Creatinine 0.44 - 1.00 mg/dL 0.97  Sodium 135 -  145 mmol/L 138  Potassium 3.5 - 5.1 mmol/L 3.9  Chloride 98 - 111 mmol/L 105  CO2 22 - 32 mmol/L 23  Calcium 8.9 - 10.3 mg/dL 8.9  Total Protein 6.5 - 8.1 g/dL 7.8  Total Bilirubin 0.3 - 1.2 mg/dL 0.5  Alkaline Phos 38 - 126 U/L 92  AST 15 - 41 U/L 28  ALT 0 - 44 U/L 23   CBC Latest Ref Rng & Units 07/07/2021  WBC 4.0 - 10.5 K/uL 7.7  Hemoglobin 12.0 - 15.0 g/dL 11.6(L)  Hematocrit 36.0 - 46.0 % 36.7  Platelets 150 - 400 K/uL 268    RADIOGRAPHIC STUDIES: I have personally reviewed the radiological images as listed and agreed with the findings in the report. CT CHEST ABDOMEN PELVIS W CONTRAST  Result Date: 07/05/2021 CLINICAL DATA:  Lung cancer, restaging. EXAM: CT CHEST, ABDOMEN, AND PELVIS WITH CONTRAST TECHNIQUE: Multidetector CT imaging of the chest, abdomen and pelvis was performed following the standard protocol during bolus administration of intravenous contrast. CONTRAST:  31m OMNIPAQUE IOHEXOL 350 MG/ML SOLN COMPARISON:  Multiple priors including most recent CT February 07, 2021 FINDINGS: CT CHEST FINDINGS Cardiovascular: Aortic and branch vessel atherosclerosis without aneurysmal dilation. No central pulmonary embolus. Normal size heart. No significant pericardial effusion/thickening. Mediastinum/Nodes: Similar extensive thoracic adenopathy. Index lesions are as follows: Right paratracheal lymph node measures 2.3 cm, image 20/2, unchanged. Low right paratracheal lymph node measures 1.8 cm on image 26/2, unchanged. Left pre vascular lymph node measures 1.3 cm on image 26/2, unchanged. Subcarinal node measures 1.8 cm on image 28/2, previously 1.9 cm. Right hilar node measures 1.4 cm on image 32/2, unchanged. Left hilar node measures 1.4 cm on image 38/2, unchanged. Lungs/Pleura: Similar appearance of the nodular soft tissue rind encasing lung pleural metastatic disease. Similar right upper lobe atelectasis. As on multiple prior examinations the margin of the right lung are difficult to  delineate, subjectively the lesion appears stable from prior and by best measurable approximation on this study the lesion measures 8.3 x 3.8 cm on image 20/2 previously 8.2  x 4.8 cm. No significant interval change in size and number of the numerous scattered bilateral metastatic lung nodules. Increase in the consolidated left lower lobe favored represent postobstructive atelectasis. Within the paramedian right lower lobe there is a development interlobular septal thickening, without nodularity, with adjacent ground-glass opacities. New small left pleural effusion, without discrete nodule. Musculoskeletal: Overall similar appearance the mixed lytic and osseous lesions visualized throughout the axial and proximal appendicular skeleton. T6 vertebral endplate compression deformity is not significantly changed. CT ABDOMEN PELVIS FINDINGS Hepatobiliary: No focal liver abnormality is seen. No gallstones, gallbladder wall thickening, or biliary dilatation. Pancreas: Unremarkable. No pancreatic ductal dilatation or surrounding inflammatory changes. Spleen: Normal in size without focal abnormality. Adrenals/Urinary Tract: Bilateral adrenal glands are unremarkable. Right renal sinus cyst. 2 cm left upper pole renal cyst. No hydronephrosis. Urinary bladder is decompressed limiting evaluation. Stomach/Bowel: Stomach is within normal limits. Appendix appears normal. No evidence of bowel wall thickening, distention, or inflammatory changes. Vascular/Lymphatic: Aortic atherosclerosis without aneurysmal dilation. Pathologically enlarged abdominopelvic. Reproductive: Uterus and bilateral adnexa are unremarkable. Other: No abdominopelvic ascites. Musculoskeletal: No significant interval change in the multifocal mixed lytic and sclerotic osseous metastatic lesions throughout the axial and proximal appendicular skeleton IMPRESSION: 1. Overall there is no significant interval change in the ill-defined right upper lobe lesion, right  pleural based metastases, bilateral metastatic lung nodules, thoracic adenopathy or the multifocal osseous metastatic lesions. No definite evidence new metastatic disease in the chest, abdomen or pelvis. 2. Interval development of interlobular septal thickening, without nodularity, with adjacent ground-glass opacities within the paramedian right lower lobe. Findings which are nonspecific and possibly related to an infectious or inflammatory process. Attention follow-up imaging suggested. 3. New small left pleural effusion without discrete left-sided pleural nodularity, attention on follow-up imaging suggested. 4.  Aortic Atherosclerosis (ICD10-I70.0). Electronically Signed   By: Dahlia Bailiff MD   On: 07/05/2021 09:28      Assessment and plan  1. Primary malignant neoplasm of lung metastatic to other site, unspecified laterality ([place])   2. Bone metastasis ([place])   3. Encounter for antineoplastic chemotherapy   4. 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  cT4 N3 M1-  KRASG12C (Lumakras in subsequent line can be considered) First-line treatment with carboplatin/Alimta/bevacizumab.[cycle 6 carboplatin hypersensitivity-discontinue] 04/07/2021 CT Partial response.  07/04/21 CT showed overall stable disease. Small new left pleural effusion, attention on follow up.  Labs are reviewed and discussed with patient. Proceed with Alimta and bevacizumab maintenance.    #Bone metastasis.  Status post T2 radiation.  Patient declined bisphosphonate  #Neoplasm related pain/treatment complication-pain appears to be stable. Pain has subsided.   I wanted to repeat brain MRI with and without contrast for surveillance.- xanax PRN for claustrophobia.  Patient prefers to defer MRI to August 2022.   Lab MD Alimta, bevacizumab, 3 weeks.  [name], MD, PhD Hematology Oncology [facility] Pager- [phone] 07/07/2021

## 2021-07-07 NOTE — Patient Instructions (Signed)
[facility]  Discharge Instructions: Thank you for choosing [facility] to provide your oncology and hematology care.  If you have a lab appointment with the [facility], please go directly to the [facility] and check in at the registration area.  Wear comfortable clothing and clothing appropriate for easy access to any Portacath or PICC line.   We strive to give you quality time with your provider. You may need to reschedule your appointment if you arrive late (15 or more minutes).  Arriving late affects you and other patients whose appointments are after yours.  Also, if you miss three or more appointments without notifying the office, you may be dismissed from the clinic at the provider's discretion.      For prescription refill requests, have your pharmacy contact our office and allow 72 hours for refills to be completed.    Today you received the following chemotherapy and/or immunotherapy agents - pemetrexed, bevacizumab      To help prevent nausea and vomiting after your treatment, we encourage you to take your nausea medication as directed.  BELOW ARE SYMPTOMS THAT SHOULD BE REPORTED IMMEDIATELY: *FEVER GREATER THAN 100.4 F (38 C) OR HIGHER *CHILLS OR SWEATING *NAUSEA AND VOMITING THAT IS NOT CONTROLLED WITH YOUR NAUSEA MEDICATION *UNUSUAL SHORTNESS OF BREATH *UNUSUAL BRUISING OR BLEEDING *URINARY PROBLEMS (pain or burning when urinating, or frequent urination) *BOWEL PROBLEMS (unusual diarrhea, constipation, pain near the anus) TENDERNESS IN MOUTH AND THROAT WITH OR WITHOUT PRESENCE OF ULCERS (sore throat, sores in mouth, or a toothache) UNUSUAL RASH, SWELLING OR PAIN  UNUSUAL VAGINAL DISCHARGE OR ITCHING   Items with * indicate a potential emergency and should be followed up as soon as possible or go to the Emergency Department if any problems should occur.  Please show the CHEMOTHERAPY ALERT CARD or IMMUNOTHERAPY ALERT  CARD at check-in to the Emergency Department and triage nurse.  Should you have questions after your visit or need to cancel or reschedule your appointment, please contact [facility]  [phone] and follow the prompts.  Office hours are 8:00 a.m. to 4:30 p.m. Monday - Friday. Please note that voicemails left after 4:00 p.m. may not be returned until the following business day.  We are closed weekends and major holidays. You have access to a nurse at all times for urgent questions. Please call the main number to the clinic [phone] and follow the prompts.  For any non-urgent questions, you may also contact your provider using MyChart. We now offer e-Visits for anyone 15 and older to request care online for non-urgent symptoms. For details visit [email or web address].   Also download the MyChart app! Go to the app store, search "MyChart", open the app, select [facility], and log in with your MyChart username and password.  Due to Covid, a mask is required upon entering the hospital/clinic. If you do not have a mask, one will be given to you upon arrival. For doctor visits, patients may have 1 support person aged 62 or older with them. For treatment visits, patients cannot have anyone with them due to current Covid guidelines and our immunocompromised population.   Pemetrexed injection What is this medication? PEMETREXED (PEM e TREX ed) is a chemotherapy drug used to treat lung cancers like non-small cell lung cancer and mesothelioma. It may also be used to treatother cancers. This medicine may be used for other purposes; ask your health care provider orpharmacist if you have questions.  COMMON BRAND NAME(S): Alimta What should I tell my care team before I take this medication? They need to know if you have any of these conditions: infection (especially a virus infection such as chickenpox, cold sores, or herpes) kidney disease low blood counts, like low  white cell, platelet, or red cell counts lung or breathing disease, like asthma radiation therapy an unusual or allergic reaction to pemetrexed, other medicines, foods, dyes, or preservative pregnant or trying to get pregnant breast-feeding How should I use this medication? This drug is given as an infusion into a vein. It is administered in a hospitalor clinic by a specially trained health care professional. Talk to your pediatrician regarding the use of this medicine in children.Special care may be needed. Overdosage: If you think you have taken too much of this medicine contact apoison control center or emergency room at once. NOTE: This medicine is only for you. Do not share this medicine with others. What if I miss a dose? It is important not to miss your dose. Call your doctor or health careprofessional if you are unable to keep an appointment. What may interact with this medication? This medicine may interact with the following medications: Ibuprofen This list may not describe all possible interactions. Give your health care provider a list of all the medicines, herbs, non-prescription drugs, or dietary supplements you use. Also tell them if you smoke, drink alcohol, or use illegaldrugs. Some items may interact with your medicine. What should I watch for while using this medication? Visit your doctor for checks on your progress. This drug may make you feel generally unwell. This is not uncommon, as chemotherapy can affect healthy cells as well as cancer cells. Report any side effects. Continue your course oftreatment even though you feel ill unless your doctor tells you to stop. In some cases, you may be given additional medicines to help with side effects.Follow all directions for their use. Call your doctor or health care professional for advice if you get a fever, chills or sore throat, or other symptoms of a cold or flu. Do not treat yourself. This drug decreases your body's ability to  fight infections. Try toavoid being around people who are sick. This medicine may increase your risk to bruise or bleed. Call your doctor orhealth care professional if you notice any unusual bleeding. Be careful brushing and flossing your teeth or using a toothpick because you may get an infection or bleed more easily. If you have any dental work done,tell your dentist you are receiving this medicine. Avoid taking products that contain aspirin, acetaminophen, ibuprofen, naproxen, or ketoprofen unless instructed by your doctor. These medicines may hide afever. Call your doctor or health care professional if you get diarrhea or mouthsores. Do not treat yourself. To protect your kidneys, drink water or other fluids as directed while you aretaking this medicine. Do not become pregnant while taking this medicine or for 6 months after stopping it. Women should inform their doctor if they wish to become pregnant or think they might be pregnant. Men should not father a child while taking this medicine and for 3 months after stopping it. This may interfere with the ability to father a child. You should talk to your doctor or health care professional if you are concerned about your fertility. There is a potential for serious side effects to an unborn child. Talk to your health care professional or pharmacist for more information. Do not breast-feed an infantwhile taking this medicine or for 1 week  after stopping it. What side effects may I notice from receiving this medication? Side effects that you should report to your doctor or health care professionalas soon as possible: allergic reactions like skin rash, itching or hives, swelling of the face, lips, or tongue breathing problems redness, blistering, peeling or loosening of the skin, including inside the mouth signs and symptoms of bleeding such as bloody or black, tarry stools; red or dark-brown urine; spitting up blood or brown material that looks like coffee  grounds; red spots on the skin; unusual bruising or bleeding from the eye, gums, or nose signs and symptoms of infection like fever or chills; cough; sore throat; pain or trouble passing urine signs and symptoms of kidney injury like trouble passing urine or change in the amount of urine signs and symptoms of liver injury like dark yellow or brown urine; general ill feeling or flu-like symptoms; light-colored stools; loss of appetite; nausea; right upper belly pain; unusually weak or tired; yellowing of the eyes or skin Side effects that usually do not require medical attention (report to yourdoctor or health care professional if they continue or are bothersome): constipation mouth sores nausea, vomiting unusually weak or tired This list may not describe all possible side effects. Call your doctor for medical advice about side effects. You may report side effects to FDA at1-800-FDA-1088. Where should I keep my medication? This drug is given in a hospital or clinic and will not be stored at home. NOTE: This sheet is a summary. It may not cover all possible information. If you have questions about this medicine, talk to your doctor, pharmacist, orhealth care provider.  2022 Elsevier/Gold Standard (2018-01-22 16:11:33)  Bevacizumab injection What is this medication? BEVACIZUMAB (be va SIZ yoo mab) is a monoclonal antibody. It is used to treatmany types of cancer. This medicine may be used for other purposes; ask your health care provider orpharmacist if you have questions. COMMON BRAND NAME(S): Avastin, MVASI, Noah Charon What should I tell my care team before I take this medication? They need to know if you have any of these conditions: diabetes heart disease high blood pressure history of coughing up blood prior anthracycline chemotherapy (e.g., doxorubicin, daunorubicin, epirubicin) recent or ongoing radiation therapy recent or planning to have surgery stroke an unusual or allergic  reaction to bevacizumab, hamster proteins, mouse proteins, other medicines, foods, dyes, or preservatives pregnant or trying to get pregnant breast-feeding How should I use this medication? This medicine is for infusion into a vein. It is given by a health careprofessional in a hospital or clinic setting. Talk to your pediatrician regarding the use of this medicine in children.Special care may be needed. Overdosage: If you think you have taken too much of this medicine contact apoison control center or emergency room at once. NOTE: This medicine is only for you. Do not share this medicine with others. What if I miss a dose? It is important not to miss your dose. Call your doctor or health careprofessional if you are unable to keep an appointment. What may interact with this medication? Interactions are not expected. This list may not describe all possible interactions. Give your health care provider a list of all the medicines, herbs, non-prescription drugs, or dietary supplements you use. Also tell them if you smoke, drink alcohol, or use illegaldrugs. Some items may interact with your medicine. What should I watch for while using this medication? Your condition will be monitored carefully while you are receiving this medicine. You will need important blood  work and urine testing done while youare taking this medicine. This medicine may increase your risk to bruise or bleed. Call your doctor orhealth care professional if you notice any unusual bleeding. Before having surgery, talk to your health care provider to make sure it is ok. This drug can increase the risk of poor healing of your surgical site or wound. You will need to stop this drug for 28 days before surgery. After surgery, wait at least 28 days before restarting this drug. Make sure the surgical site or wound is healed enough before restarting this drug. Talk to your health careprovider if questions. Do not become pregnant while taking  this medicine or for 6 months after stopping it. Women should inform their doctor if they wish to become pregnant or think they might be pregnant. There is a potential for serious side effects to an unborn child. Talk to your health care professional or pharmacist for more information. Do not breast-feed an infant while taking this medicine andfor 6 months after the last dose. This medicine has caused ovarian failure in some women. This medicine may interfere with the ability to have a child. You should talk to your doctor orhealth care professional if you are concerned about your fertility. What side effects may I notice from receiving this medication? Side effects that you should report to your doctor or health care professionalas soon as possible: allergic reactions like skin rash, itching or hives, swelling of the face, lips, or tongue chest pain or chest tightness chills coughing up blood high fever seizures severe constipation signs and symptoms of bleeding such as bloody or black, tarry stools; red or dark-brown urine; spitting up blood or brown material that looks like coffee grounds; red spots on the skin; unusual bruising or bleeding from the eye, gums, or nose signs and symptoms of a blood clot such as breathing problems; chest pain; severe, sudden headache; pain, swelling, warmth in the leg signs and symptoms of a stroke like changes in vision; confusion; trouble speaking or understanding; severe headaches; sudden numbness or weakness of the face, arm or leg; trouble walking; dizziness; loss of balance or coordination stomach pain sweating swelling of legs or ankles vomiting weight gain Side effects that usually do not require medical attention (report to yourdoctor or health care professional if they continue or are bothersome): back pain changes in taste decreased appetite dry skin nausea tiredness This list may not describe all possible side effects. Call your doctor for  medical advice about side effects. You may report side effects to FDA at1-800-FDA-1088. Where should I keep my medication? This drug is given in a hospital or clinic and will not be stored at home. NOTE: This sheet is a summary. It may not cover all possible information. If you have questions about this medicine, talk to your doctor, pharmacist, orhealth care provider.  2022 Elsevier/Gold Standard (2019-09-30 10:50:46)

## 2021-07-24 ENCOUNTER — Other Ambulatory Visit: Payer: Self-pay | Admitting: *Deleted

## 2021-07-24 MED ORDER — OXYCODONE HCL 5 MG PO TABS: 5.0000 mg | ORAL_TABLET | Freq: Four times a day (QID) | ORAL | 0 refills | Status: DC | PRN

## 2021-07-28 ENCOUNTER — Inpatient Hospital Stay (HOSPITAL_BASED_OUTPATIENT_CLINIC_OR_DEPARTMENT_OTHER): Payer: BC Managed Care – PPO | Admitting: Oncology

## 2021-07-28 ENCOUNTER — Inpatient Hospital Stay: Payer: BC Managed Care – PPO

## 2021-07-28 ENCOUNTER — Inpatient Hospital Stay: Payer: BC Managed Care – PPO | Attending: Oncology

## 2021-07-28 ENCOUNTER — Other Ambulatory Visit: Payer: Self-pay

## 2021-07-28 ENCOUNTER — Encounter: Payer: Self-pay | Admitting: Oncology

## 2021-07-28 VITALS — BP 125/68 | HR 93 | Temp 98.6°F | Resp 18 | Wt 160.5 lb

## 2021-07-28 DIAGNOSIS — Z803 Family history of malignant neoplasm of breast: Secondary | ICD-10-CM | POA: Insufficient documentation

## 2021-07-28 DIAGNOSIS — G893 Neoplasm related pain (acute) (chronic): Secondary | ICD-10-CM | POA: Diagnosis not present

## 2021-07-28 DIAGNOSIS — K219 Gastro-esophageal reflux disease without esophagitis: Secondary | ICD-10-CM | POA: Diagnosis not present

## 2021-07-28 DIAGNOSIS — E538 Deficiency of other specified B group vitamins: Secondary | ICD-10-CM | POA: Diagnosis not present

## 2021-07-28 DIAGNOSIS — Z800 Family history of malignant neoplasm of digestive organs: Secondary | ICD-10-CM | POA: Insufficient documentation

## 2021-07-28 DIAGNOSIS — Z5111 Encounter for antineoplastic chemotherapy: Secondary | ICD-10-CM | POA: Insufficient documentation

## 2021-07-28 DIAGNOSIS — F17210 Nicotine dependence, cigarettes, uncomplicated: Secondary | ICD-10-CM | POA: Diagnosis not present

## 2021-07-28 DIAGNOSIS — Z5112 Encounter for antineoplastic immunotherapy: Secondary | ICD-10-CM | POA: Diagnosis present

## 2021-07-28 DIAGNOSIS — J910 Malignant pleural effusion: Secondary | ICD-10-CM | POA: Insufficient documentation

## 2021-07-28 DIAGNOSIS — G35 Multiple sclerosis: Secondary | ICD-10-CM | POA: Diagnosis not present

## 2021-07-28 DIAGNOSIS — C3490 Malignant neoplasm of unspecified part of unspecified bronchus or lung: Secondary | ICD-10-CM | POA: Insufficient documentation

## 2021-07-28 DIAGNOSIS — C7951 Secondary malignant neoplasm of bone: Secondary | ICD-10-CM | POA: Insufficient documentation

## 2021-07-28 DIAGNOSIS — J449 Chronic obstructive pulmonary disease, unspecified: Secondary | ICD-10-CM | POA: Diagnosis not present

## 2021-07-28 LAB — COMPREHENSIVE METABOLIC PANEL
ALT: 19 U/L (ref 0–44)
AST: 27 U/L (ref 15–41)
Albumin: 3.2 g/dL — ABNORMAL LOW (ref 3.5–5.0)
Alkaline Phosphatase: 77 U/L (ref 38–126)
Anion gap: 10 (ref 5–15)
BUN: 19 mg/dL (ref 8–23)
CO2: 24 mmol/L (ref 22–32)
Calcium: 9 mg/dL (ref 8.9–10.3)
Chloride: 104 mmol/L (ref 98–111)
Creatinine, Ser: 0.72 mg/dL (ref 0.44–1.00)
GFR, Estimated: >60 mL/min (ref >60)
Glucose, Bld: 92 mg/dL (ref 70–99)
Potassium: 3.9 mmol/L (ref 3.5–5.1)
Sodium: 138 mmol/L (ref 135–145)
Total Bilirubin: 0.4 mg/dL (ref 0.3–1.2)
Total Protein: 7.6 g/dL (ref 6.5–8.1)

## 2021-07-28 LAB — PROTEIN, URINE, RANDOM: Total Protein, Urine: 7 mg/dL

## 2021-07-28 LAB — CBC WITH DIFFERENTIAL/PLATELET
Abs Immature Granulocytes: 0.02 10*3/uL (ref 0.00–0.07)
Basophils Absolute: 0 10*3/uL (ref 0.0–0.1)
Basophils Relative: 1 %
Eosinophils Absolute: 0.1 10*3/uL (ref 0.0–0.5)
Eosinophils Relative: 1 %
HCT: 36.8 % (ref 36.0–46.0)
Hemoglobin: 11.4 g/dL — ABNORMAL LOW (ref 12.0–15.0)
Immature Granulocytes: 0 %
Lymphocytes Relative: 13 %
Lymphs Abs: 0.8 10*3/uL (ref 0.7–4.0)
MCH: 27 pg (ref 26.0–34.0)
MCHC: 31 g/dL (ref 30.0–36.0)
MCV: 87.2 fL (ref 80.0–100.0)
Monocytes Absolute: 0.7 10*3/uL (ref 0.1–1.0)
Monocytes Relative: 11 %
Neutro Abs: 4.5 10*3/uL (ref 1.7–7.7)
Neutrophils Relative %: 74 %
Platelets: 242 10*3/uL (ref 150–400)
RBC: 4.22 MIL/uL (ref 3.87–5.11)
RDW: 16.7 % — ABNORMAL HIGH (ref 11.5–15.5)
WBC: 6.1 10*3/uL (ref 4.0–10.5)
nRBC: 0 % (ref 0.0–0.2)

## 2021-07-28 MED ORDER — CYANOCOBALAMIN 1000 MCG/ML IJ SOLN
1000.0000 ug | Freq: Once | INTRAMUSCULAR | Status: AC
  Administered 2021-07-28: 1000 ug via INTRAMUSCULAR
  Filled 2021-07-28: qty 1

## 2021-07-28 MED ORDER — SODIUM CHLORIDE 0.9 % IV SOLN
Freq: Once | INTRAVENOUS | Status: AC
Start: 2021-07-28 — End: 2021-07-28
  Filled 2021-07-28: qty 250

## 2021-07-28 MED ORDER — SODIUM CHLORIDE 0.9 % IV SOLN
1000.0000 mg | Freq: Once | INTRAVENOUS | Status: AC
  Administered 2021-07-28: 1000 mg via INTRAVENOUS
  Filled 2021-07-28: qty 32

## 2021-07-28 MED ORDER — SODIUM CHLORIDE 0.9 % IV SOLN
500.0000 mg/m2 | Freq: Once | INTRAVENOUS | Status: AC
  Administered 2021-07-28: 900 mg via INTRAVENOUS
  Filled 2021-07-28: qty 20

## 2021-07-28 MED ORDER — PROCHLORPERAZINE MALEATE 10 MG PO TABS
10.0000 mg | ORAL_TABLET | Freq: Once | ORAL | Status: AC
  Administered 2021-07-28: 10 mg via ORAL
  Filled 2021-07-28: qty 1

## 2021-07-28 NOTE — Patient Instructions (Signed)
[facility]  Discharge Instructions: Thank you for choosing [facility] to provide your oncology and hematology care.  If you have a lab appointment with the [facility], please go directly to the [facility] and check in at the registration area.  Wear comfortable clothing and clothing appropriate for easy access to any Portacath or PICC line.   We strive to give you quality time with your provider. You may need to reschedule your appointment if you arrive late (15 or more minutes).  Arriving late affects you and other patients whose appointments are after yours.  Also, if you miss three or more appointments without notifying the office, you may be dismissed from the clinic at the provider's discretion.      For prescription refill requests, have your pharmacy contact our office and allow 72 hours for refills to be completed.    Today you received the following chemotherapy and/or immunotherapy agents ALIMTA, VIT B12, ZIRABEV      To help prevent nausea and vomiting after your treatment, we encourage you to take your nausea medication as directed.  BELOW ARE SYMPTOMS THAT SHOULD BE REPORTED IMMEDIATELY: *FEVER GREATER THAN 100.4 F (38 C) OR HIGHER *CHILLS OR SWEATING *NAUSEA AND VOMITING THAT IS NOT CONTROLLED WITH YOUR NAUSEA MEDICATION *UNUSUAL SHORTNESS OF BREATH *UNUSUAL BRUISING OR BLEEDING *URINARY PROBLEMS (pain or burning when urinating, or frequent urination) *BOWEL PROBLEMS (unusual diarrhea, constipation, pain near the anus) TENDERNESS IN MOUTH AND THROAT WITH OR WITHOUT PRESENCE OF ULCERS (sore throat, sores in mouth, or a toothache) UNUSUAL RASH, SWELLING OR PAIN  UNUSUAL VAGINAL DISCHARGE OR ITCHING   Items with * indicate a potential emergency and should be followed up as soon as possible or go to the Emergency Department if any problems should occur.  Please show the CHEMOTHERAPY ALERT CARD or IMMUNOTHERAPY ALERT  CARD at check-in to the Emergency Department and triage nurse.  Should you have questions after your visit or need to cancel or reschedule your appointment, please contact [facility]  [phone] and follow the prompts.  Office hours are 8:00 a.m. to 4:30 p.m. Monday - Friday. Please note that voicemails left after 4:00 p.m. may not be returned until the following business day.  We are closed weekends and major holidays. You have access to a nurse at all times for urgent questions. Please call the main number to the clinic [phone] and follow the prompts.  For any non-urgent questions, you may also contact your provider using MyChart. We now offer e-Visits for anyone 83 and older to request care online for non-urgent symptoms. For details visit [email or web address].   Also download the MyChart app! Go to the app store, search "MyChart", open the app, select [facility], and log in with your MyChart username and password.  Due to Covid, a mask is required upon entering the hospital/clinic. If you do not have a mask, one will be given to you upon arrival. For doctor visits, patients may have 1 support person aged 29 or older with them. For treatment visits, patients cannot have anyone with them due to current Covid guidelines and our immunocompromised population.   Pemetrexed injection What is this medication? PEMETREXED (PEM e TREX ed) is a chemotherapy drug used to treat lung cancers like non-small cell lung cancer and mesothelioma. It may also be used to treatother cancers. This medicine may be used for other purposes; ask your health care provider orpharmacist if you have  questions. COMMON BRAND NAME(S): Alimta What should I tell my care team before I take this medication? They need to know if you have any of these conditions: infection (especially a virus infection such as chickenpox, cold sores, or herpes) kidney disease low blood counts, like low  white cell, platelet, or red cell counts lung or breathing disease, like asthma radiation therapy an unusual or allergic reaction to pemetrexed, other medicines, foods, dyes, or preservative pregnant or trying to get pregnant breast-feeding How should I use this medication? This drug is given as an infusion into a vein. It is administered in a hospitalor clinic by a specially trained health care professional. Talk to your pediatrician regarding the use of this medicine in children.Special care may be needed. Overdosage: If you think you have taken too much of this medicine contact apoison control center or emergency room at once. NOTE: This medicine is only for you. Do not share this medicine with others. What if I miss a dose? It is important not to miss your dose. Call your doctor or health careprofessional if you are unable to keep an appointment. What may interact with this medication? This medicine may interact with the following medications: Ibuprofen This list may not describe all possible interactions. Give your health care provider a list of all the medicines, herbs, non-prescription drugs, or dietary supplements you use. Also tell them if you smoke, drink alcohol, or use illegaldrugs. Some items may interact with your medicine. What should I watch for while using this medication? Visit your doctor for checks on your progress. This drug may make you feel generally unwell. This is not uncommon, as chemotherapy can affect healthy cells as well as cancer cells. Report any side effects. Continue your course oftreatment even though you feel ill unless your doctor tells you to stop. In some cases, you may be given additional medicines to help with side effects.Follow all directions for their use. Call your doctor or health care professional for advice if you get a fever, chills or sore throat, or other symptoms of a cold or flu. Do not treat yourself. This drug decreases your body's ability to  fight infections. Try toavoid being around people who are sick. This medicine may increase your risk to bruise or bleed. Call your doctor orhealth care professional if you notice any unusual bleeding. Be careful brushing and flossing your teeth or using a toothpick because you may get an infection or bleed more easily. If you have any dental work done,tell your dentist you are receiving this medicine. Avoid taking products that contain aspirin, acetaminophen, ibuprofen, naproxen, or ketoprofen unless instructed by your doctor. These medicines may hide afever. Call your doctor or health care professional if you get diarrhea or mouthsores. Do not treat yourself. To protect your kidneys, drink water or other fluids as directed while you aretaking this medicine. Do not become pregnant while taking this medicine or for 6 months after stopping it. Women should inform their doctor if they wish to become pregnant or think they might be pregnant. Men should not father a child while taking this medicine and for 3 months after stopping it. This may interfere with the ability to father a child. You should talk to your doctor or health care professional if you are concerned about your fertility. There is a potential for serious side effects to an unborn child. Talk to your health care professional or pharmacist for more information. Do not breast-feed an infantwhile taking this medicine or for 1  week after stopping it. What side effects may I notice from receiving this medication? Side effects that you should report to your doctor or health care professionalas soon as possible: allergic reactions like skin rash, itching or hives, swelling of the face, lips, or tongue breathing problems redness, blistering, peeling or loosening of the skin, including inside the mouth signs and symptoms of bleeding such as bloody or black, tarry stools; red or dark-brown urine; spitting up blood or brown material that looks like coffee  grounds; red spots on the skin; unusual bruising or bleeding from the eye, gums, or nose signs and symptoms of infection like fever or chills; cough; sore throat; pain or trouble passing urine signs and symptoms of kidney injury like trouble passing urine or change in the amount of urine signs and symptoms of liver injury like dark yellow or brown urine; general ill feeling or flu-like symptoms; light-colored stools; loss of appetite; nausea; right upper belly pain; unusually weak or tired; yellowing of the eyes or skin Side effects that usually do not require medical attention (report to yourdoctor or health care professional if they continue or are bothersome): constipation mouth sores nausea, vomiting unusually weak or tired This list may not describe all possible side effects. Call your doctor for medical advice about side effects. You may report side effects to FDA at1-800-FDA-1088. Where should I keep my medication? This drug is given in a hospital or clinic and will not be stored at home. NOTE: This sheet is a summary. It may not cover all possible information. If you have questions about this medicine, talk to your doctor, pharmacist, orhealth care provider.  2022 Elsevier/Gold Standard (2018-01-22 16:11:33)  Vitamin B12 Injection What is this medication? Vitamin B12 (VAHY tuh min B12) prevents and treats low vitamin B12 levels in your body. It is used in people who do not get enough vitamin B12 from their diet or when their digestive tract does not absorb enough. Vitamin B12 plays an important role in maintaining the health of your nervous system and red bloodcells. This medicine may be used for other purposes; ask your health care provider orpharmacist if you have questions. COMMON BRAND NAME(S): B-12 Compliance Kit, B-12 Injection Kit, Cyomin, LA-12,Nutri-Twelve, Physicians EZ Use B-12, Primabalt What should I tell my care team before I take this medication? They need to know if you  have any of these conditions: Kidney disease Leber's disease Megaloblastic anemia An unusual or allergic reaction to cyanocobalamin, cobalt, other medications, foods, dyes, or preservatives Pregnant or trying to get pregnant Breast-feeding How should I use this medication? This medication is injected into a muscle or deeply under the skin. It is usually given in a clinic or care team's office. However, your care team [place] you how to inject yourself. Follow all instructions. Talk to your care team about the use of this medication in children. Specialcare may be needed. Overdosage: If you think you have taken too much of this medicine contact apoison control center or emergency room at once. NOTE: This medicine is only for you. Do not share this medicine with others. What if I miss a dose? If you are given your dose at a clinic or care team's office, call to reschedule your appointment. If you give your own injections, and you miss a dose, take it as soon as you can. If it is almost time for your next dose, takeonly that dose. Do not take double or extra doses. What may interact with this medication? Colchicine Heavy  alcohol intake This list may not describe all possible interactions. Give your health care provider a list of all the medicines, herbs, non-prescription drugs, or dietary supplements you use. Also tell them if you smoke, drink alcohol, or use illegaldrugs. Some items may interact with your medicine. What should I watch for while using this medication? Visit your care team regularly. You may need blood work done while you aretaking this medication. You may need to follow a special diet. Talk to your care team. Limit youralcohol intake and avoid smoking to get the best benefit. What side effects may I notice from receiving this medication? Side effects that you should report to your care team as soon as possible: Allergic reactions-skin rash, itching, hives, swelling of the face,  lips, tongue, or throat Swelling of the ankles, hands, or feet Trouble breathing Side effects that usually do not require medical attention (report to your careteam if they continue or are bothersome): Diarrhea This list may not describe all possible side effects. Call your doctor for medical advice about side effects. You may report side effects to FDA at1-800-FDA-1088. Where should I keep my medication? Keep out of the reach of children. Store at room temperature between 15 and 30 degrees C (59 and 85 degrees F).Protect from light. Throw away any unused medication after the expiration date. NOTE: This sheet is a summary. It may not cover all possible information. If you have questions about this medicine, talk to your doctor, pharmacist, orhealth care provider.  2022 Elsevier/Gold Standard (2021-01-23 11:47:06)  Bevacizumab injection What is this medication? BEVACIZUMAB (be va SIZ yoo mab) is a monoclonal antibody. It is used to treatmany types of cancer. This medicine may be used for other purposes; ask your health care provider orpharmacist if you have questions. COMMON BRAND NAME(S): Avastin, MVASI, Noah Charon What should I tell my care team before I take this medication? They need to know if you have any of these conditions: diabetes heart disease high blood pressure history of coughing up blood prior anthracycline chemotherapy (e.g., doxorubicin, daunorubicin, epirubicin) recent or ongoing radiation therapy recent or planning to have surgery stroke an unusual or allergic reaction to bevacizumab, hamster proteins, mouse proteins, other medicines, foods, dyes, or preservatives pregnant or trying to get pregnant breast-feeding How should I use this medication? This medicine is for infusion into a vein. It is given by a health careprofessional in a hospital or clinic setting. Talk to your pediatrician regarding the use of this medicine in children.Special care may be  needed. Overdosage: If you think you have taken too much of this medicine contact apoison control center or emergency room at once. NOTE: This medicine is only for you. Do not share this medicine with others. What if I miss a dose? It is important not to miss your dose. Call your doctor or health careprofessional if you are unable to keep an appointment. What may interact with this medication? Interactions are not expected. This list may not describe all possible interactions. Give your health care provider a list of all the medicines, herbs, non-prescription drugs, or dietary supplements you use. Also tell them if you smoke, drink alcohol, or use illegaldrugs. Some items may interact with your medicine. What should I watch for while using this medication? Your condition will be monitored carefully while you are receiving this medicine. You will need important blood work and urine testing done while youare taking this medicine. This medicine may increase your risk to bruise or bleed. Call your doctor orhealth  care professional if you notice any unusual bleeding. Before having surgery, talk to your health care provider to make sure it is ok. This drug can increase the risk of poor healing of your surgical site or wound. You will need to stop this drug for 28 days before surgery. After surgery, wait at least 28 days before restarting this drug. Make sure the surgical site or wound is healed enough before restarting this drug. Talk to your health careprovider if questions. Do not become pregnant while taking this medicine or for 6 months after stopping it. Women should inform their doctor if they wish to become pregnant or think they might be pregnant. There is a potential for serious side effects to an unborn child. Talk to your health care professional or pharmacist for more information. Do not breast-feed an infant while taking this medicine andfor 6 months after the last dose. This medicine has caused  ovarian failure in some women. This medicine may interfere with the ability to have a child. You should talk to your doctor orhealth care professional if you are concerned about your fertility. What side effects may I notice from receiving this medication? Side effects that you should report to your doctor or health care professionalas soon as possible: allergic reactions like skin rash, itching or hives, swelling of the face, lips, or tongue chest pain or chest tightness chills coughing up blood high fever seizures severe constipation signs and symptoms of bleeding such as bloody or black, tarry stools; red or dark-brown urine; spitting up blood or brown material that looks like coffee grounds; red spots on the skin; unusual bruising or bleeding from the eye, gums, or nose signs and symptoms of a blood clot such as breathing problems; chest pain; severe, sudden headache; pain, swelling, warmth in the leg signs and symptoms of a stroke like changes in vision; confusion; trouble speaking or understanding; severe headaches; sudden numbness or weakness of the face, arm or leg; trouble walking; dizziness; loss of balance or coordination stomach pain sweating swelling of legs or ankles vomiting weight gain Side effects that usually do not require medical attention (report to yourdoctor or health care professional if they continue or are bothersome): back pain changes in taste decreased appetite dry skin nausea tiredness This list may not describe all possible side effects. Call your doctor for medical advice about side effects. You may report side effects to FDA at1-800-FDA-1088. Where should I keep my medication? This drug is given in a hospital or clinic and will not be stored at home. NOTE: This sheet is a summary. It may not cover all possible information. If you have questions about this medicine, talk to your doctor, pharmacist, orhealth care provider.  2022 Elsevier/Gold Standard  (2019-09-30 10:50:46)

## 2021-07-28 NOTE — Progress Notes (Signed)
Hematology/Oncology Follow Up Note [facility]  Telephone:([phone] [phone]  Patient Care Team: Telford Nab, RN as Oncology Nurse Navigator [name], MD as Medical Oncologist (Oncology)   Name of the patient: [name]  [id number]  1961/07/12   REASON FOR VISIT  follow-up for lung cancer  PERTINENT ONCOLOGY HISTORY 60 y.o. female with past medical history including GERD, COPD, current everyday smoker presents for follow-up of lung mass and pleural effusion. 10/20/2020, patient was brought to ED via EMS due to generalized weakness, dizziness, chest pain shortness of breath.  She was recently diagnosed with COPD approximately 1 month ago by primary care provider.  Also unintentional weight loss during the last few months. Image work-up showed right-sided pleural effusion.  She underwent right thoracentesis.  With removal of 2.4 L of fluid. 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  Metastatic disease to low cervical/thoracic nodes, lung, right pleural space and bones.  Moderate to large right pleural effusion.  SVC narrowing. 10/21/2020 MRI brain is negative for metastasis.  Mild chronic microvascular ischemic changes in the white matter.  Negative for acute infarct Regarding to the SVC narrowing, patient was seen by vascular surgeon and was recommended no intervention inpatient.  Patient to follow-up outpatient with vascular surgeon for evaluation. Patient also was treated for COPD exacerbation with hypoxia on exertion. Qualifies for home oxygen and hospitalist arrange home health and a nebulizer.  Patient was given a course of prednisone taper and empiric Levaquin. Patient was discharged and present today to follow-up with cytology results and further management plan  10/27/2020 PET scan showed right upper lobe primary bronchogenic mass with direct invasion  into mediastinum. Metastatic disease to the right pleural space, lung, bone, nodes of the chest and less so lower leg/upper abdomen. Hypermetabolic him along the course of the right axillary vein with concurrent subtle hyper attenuation within the axillary vein and SVC.  This continues to the level of SVC narrowing.  Suspicious for SVC occlusion and developing thrombus. Moderate right pleural effusion and small pericardial effusion.  # # left supraclavicular mass biopsy- pathology is positive for adenocarcinoma. positive for CK7, with patchy, weak CK20.  They are  negative for TTF1, NapsinA, GATA3, CDX2, and Pax8.  The findings are  nonspecific; but may be compatible with a poorly differentiated  adenocarcinoma of lung origin, especially given the imaging findings  She also had another repeat thoracentesis and fluid cytology showed malignancy.  #NGS showed AXIN 1 [id number], DIS2 M153f, KRAS G12C, PRDM1 M1 [id number], [id number] MS stable, TPS <1%   11/14/2020- 12/12/2020.  #Bronchial obstruction and SVC occlusion.  s/p palliative radiation #After 2 cycles of chemtherapy, patient had CT scan done due to shortness of breath.  01/05/2021, CT chest angiogram PE protocol showed no evidence of pulmonary embolism.  Stable disease.  Right upper lobe mass about 6.7 x 5.3 cm.  Widespread metastatic disease, lung and bone involvement. Interval development of right-sided hydronephrosis. 03/06/2021 finished T2 palliative radiation 03/22/2021, 6 cycles of carboplatin/Alimta/bevacizumab.  Infusion reaction of carboplatin during cycle 6.  Carboplatin discontinued. 04/07/2021 CT chest abdomen pelvis-partial response Mild decrease in size of right lung mass, mildly decreased mediastinal and hilar adenopathy.  Extensive right-sided pleural metastasis unchanged.  Similar appearance of diffuse bilateral pulmonary nodules.  Multifocal lytic sclerotic bone metastasis.  New superior endplate deformity of T6  INTERVAL  HISTORY VZAILYNN BRANDELis a 60y.o. female who has above  history reviewed by me today presents for follow up visit for management of metastatic lung cancer, acute visit for swallowing pain.  Problems and complaints are listed below Weight is stable.  She was accompanied by her daughter.  She continues to have right lower extremity muscle aches, intermittently, no obvious triggers, usually improves after walking.  Denies shortness of breath, cough, chest pain.  Appetite is good.    Review of Systems  Constitutional:  Negative for chills, fatigue, fever and unexpected weight change.  HENT:   Negative for hearing loss and voice change.   Eyes:  Negative for eye problems.  Respiratory:  Negative for chest tightness, cough and shortness of breath.   Cardiovascular:  Negative for chest pain.  Gastrointestinal:  Negative for abdominal distention, abdominal pain and blood in stool.  Endocrine: Negative for hot flashes.  Genitourinary:  Negative for difficulty urinating and frequency.   Musculoskeletal:  Negative for arthralgias.  Skin:  Negative for itching and rash.  Neurological:  Negative for extremity weakness.  Hematological:  Negative for adenopathy.  Psychiatric/Behavioral:  Negative for confusion.      Allergies  Allergen Reactions   Carboplatin Shortness Of Breath, Itching and Cough     Past Medical History:  Diagnosis Date   COPD (chronic obstructive pulmonary disease) (HCC)    GERD (gastroesophageal reflux disease)    Metastatic lung cancer (metastasis from lung to other site) ([place]) 11/02/2020     Past Surgical History:  Procedure Laterality Date   COLONOSCOPY WITH ESOPHAGOGASTRODUODENOSCOPY (EGD)     COLONOSCOPY WITH PROPOFOL N/A 11/14/2015   Procedure: COLONOSCOPY WITH PROPOFOL;  Surgeon: Hulen Luster, MD;  Location: [facility] ENDOSCOPY;  Service: Gastroenterology;  Laterality: N/A;   ESOPHAGOGASTRODUODENOSCOPY (EGD) WITH PROPOFOL N/A 11/14/2015   Procedure:  ESOPHAGOGASTRODUODENOSCOPY (EGD) WITH PROPOFOL;  Surgeon: Hulen Luster, MD;  Location: [facility] ENDOSCOPY;  Service: Gastroenterology;  Laterality: N/A;    Social History   Socioeconomic History   Marital status: Married    Spouse name: Not on file   Number of children: Not on file   Years of education: Not on file   Highest education level: Not on file  Occupational History   Not on file  Tobacco Use   Smoking status: Every Day    Packs/day: 0.20    Types: Cigarettes   Smokeless tobacco: Never  Substance and Sexual Activity   Alcohol use: Never   Drug use: Never   Sexual activity: Not Currently  Other Topics Concern   Not on file  Social History Narrative   Not on file   Social Determinants of Health   Financial Resource Strain: Not on file  Food Insecurity: Not on file  Transportation Needs: Not on file  Physical Activity: Not on file  Stress: Not on file  Social Connections: Not on file  Intimate Partner Violence: Not on file    Family History  Problem Relation Age of Onset   Breast cancer Cousin 72   Diabetes type II Mother    Hypertension Mother    Colon cancer Father    Hypertension Father      Current Outpatient Medications:    acetaminophen (TYLENOL) 500 MG tablet, Take 1,000 mg by mouth every 8 (eight) hours as needed for moderate pain. , Disp: , Rfl:    ALPRAZolam (XANAX) 0.5 MG tablet, Take 1 tablet (0.5 mg total) by mouth See admin instructions. Take 1 tablet 30 minutes prior to MRI procedure, may take another tablet if needed., Disp: 2  tablet, Rfl: 0   benzonatate (TESSALON) 100 MG capsule, Take 1 capsule (100 mg total) by mouth 3 (three) times daily as needed for cough., Disp: 60 capsule, Rfl: 1   dexamethasone (DECADRON) 4 MG tablet, Take 1 tab (4 mg) twice a day the day before Alimta chemo. Take 2 tablets (8 mg) po daily x 3 days starting the day after chemo., Disp: 30 tablet, Rfl: 1   folic acid (FOLVITE) 1 MG tablet, Take 1 tablet (1 mg total) by mouth  daily. Start 5-7 days before Alimta chemotherapy. Continue until 21 days after Alimta completed., Disp: 100 tablet, Rfl: 3   guaiFENesin (MUCINEX) 600 MG 12 hr tablet, Take 1 tablet (600 mg total) by mouth 2 (two) times daily., Disp: 14 tablet, Rfl: 0   ipratropium-albuterol (DUONEB) 0.5-2.5 (3) MG/3ML SOLN, Take 3 mLs by nebulization 3 (three) times daily., Disp: 360 mL, Rfl: 1   magic mouthwash w/lidocaine SOLN, Take 5 mLs by mouth 4 (four) times daily as needed for mouth pain. Sig: Swish/Swallow 5-10 ml four times a day as needed. Dispense 480 ml. 1RF, Disp: 480 mL, Rfl: 1   omeprazole (PRILOSEC) 20 MG capsule, Take 1 capsule (20 mg total) by mouth 2 (two) times daily before a meal., Disp: 60 capsule, Rfl: 1   oxyCODONE (OXY IR/ROXICODONE) 5 MG immediate release tablet, Take 1 tablet (5 mg total) by mouth every 6 (six) hours as needed for moderate pain or severe pain., Disp: 60 tablet, Rfl: 0   Polyethylene Glycol 3350 (MIRALAX PO), Take by mouth daily as needed., Disp: , Rfl:    prochlorperazine (COMPAZINE) 10 MG tablet, Take 1 tablet (10 mg total) by mouth every 6 (six) hours as needed (Nausea or vomiting)., Disp: 30 tablet, Rfl: 1   protein supplement shake (PREMIER PROTEIN) LIQD, Take 2 oz by mouth 4 (four) times daily., Disp: , Rfl:    senna-docusate (SENOKOT-S) 8.6-50 MG tablet, Take 2 tablets by mouth daily., Disp: 60 tablet, Rfl: 3   sucralfate (CARAFATE) 1 GM/10ML suspension, Take 10 mLs (1 g total) by mouth in the morning, at noon, and at bedtime. (Patient taking differently: Take 1 g by mouth 3 (three) times daily as needed.), Disp: 420 mL, Rfl: 0   tiotropium (SPIRIVA HANDIHALER) 18 MCG inhalation capsule, Place 18 mcg into inhaler and inhale daily., Disp: , Rfl:    ondansetron (ZOFRAN) 8 MG tablet, Take 1 tablet (8 mg total) by mouth 2 (two) times daily as needed for refractory nausea / vomiting. Start on day 3 after chemo. (Patient not taking: No sig reported), Disp: 30 tablet, Rfl:  1  Physical exam: ECOG 1 Vitals:   07/28/21 0926  BP: 125/68  Pulse: 93  Resp: 18  Temp: 98.6 F (37 C)  SpO2: 98%  Weight: 160 lb 8 oz (72.8 kg)   Physical Exam Constitutional:      General: She is not in acute distress.    Comments: Thin built female walks independently  HENT:     Head: Normocephalic and atraumatic.  Eyes:     General: No scleral icterus. Cardiovascular:     Rate and Rhythm: Normal rate and regular rhythm.     Heart sounds: Normal heart sounds.  Pulmonary:     Effort: Pulmonary effort is normal. No respiratory distress.     Breath sounds: No wheezing.     Comments: Slightly decreased breath sound bilaterally.  Abdominal:     General: Bowel sounds are normal. There is no distension.  Palpations: Abdomen is soft.  Musculoskeletal:        General: No deformity. Normal range of motion.     Cervical back: Normal range of motion and neck supple.  Skin:    General: Skin is warm and dry.     Findings: No erythema or rash.  Neurological:     Mental Status: She is alert and oriented to person, place, and time. Mental status is at baseline.     Cranial Nerves: No cranial nerve deficit.     Coordination: Coordination normal.  Psychiatric:        Mood and Affect: Mood normal.    CMP Latest Ref Rng & Units 07/28/2021  Glucose 70 - 99 mg/dL 92  BUN 8 - 23 mg/dL 19  Creatinine 0.44 - 1.00 mg/dL 0.72  Sodium 135 - 145 mmol/L 138  Potassium 3.5 - 5.1 mmol/L 3.9  Chloride 98 - 111 mmol/L 104  CO2 22 - 32 mmol/L 24  Calcium 8.9 - 10.3 mg/dL 9.0  Total Protein 6.5 - 8.1 g/dL 7.6  Total Bilirubin 0.3 - 1.2 mg/dL 0.4  Alkaline Phos 38 - 126 U/L 77  AST 15 - 41 U/L 27  ALT 0 - 44 U/L 19   CBC Latest Ref Rng & Units 07/28/2021  WBC 4.0 - 10.5 K/uL 6.1  Hemoglobin 12.0 - 15.0 g/dL 11.4(L)  Hematocrit 36.0 - 46.0 % 36.8  Platelets 150 - 400 K/uL 242    RADIOGRAPHIC STUDIES: I have personally reviewed the radiological images as listed and agreed with the  findings in the report. CT CHEST ABDOMEN PELVIS W CONTRAST  Result Date: 07/05/2021 CLINICAL DATA:  Lung cancer, restaging. EXAM: CT CHEST, ABDOMEN, AND PELVIS WITH CONTRAST TECHNIQUE: Multidetector CT imaging of the chest, abdomen and pelvis was performed following the standard protocol during bolus administration of intravenous contrast. CONTRAST:  7mL OMNIPAQUE IOHEXOL 350 MG/ML SOLN COMPARISON:  Multiple priors including most recent CT February 07, 2021 FINDINGS: CT CHEST FINDINGS Cardiovascular: Aortic and branch vessel atherosclerosis without aneurysmal dilation. No central pulmonary embolus. Normal size heart. No significant pericardial effusion/thickening. Mediastinum/Nodes: Similar extensive thoracic adenopathy. Index lesions are as follows: Right paratracheal lymph node measures 2.3 cm, image 20/2, unchanged. Low right paratracheal lymph node measures 1.8 cm on image 26/2, unchanged. Left pre vascular lymph node measures 1.3 cm on image 26/2, unchanged. Subcarinal node measures 1.8 cm on image 28/2, previously 1.9 cm. Right hilar node measures 1.4 cm on image 32/2, unchanged. Left hilar node measures 1.4 cm on image 38/2, unchanged. Lungs/Pleura: Similar appearance of the nodular soft tissue rind encasing lung pleural metastatic disease. Similar right upper lobe atelectasis. As on multiple prior examinations the margin of the right lung are difficult to delineate, subjectively the lesion appears stable from prior and by best measurable approximation on this study the lesion measures 8.3 x 3.8 cm on image 20/2 previously 8.2 x 4.8 cm. No significant interval change in size and number of the numerous scattered bilateral metastatic lung nodules. Increase in the consolidated left lower lobe favored represent postobstructive atelectasis. Within the paramedian right lower lobe there is a development interlobular septal thickening, without nodularity, with adjacent ground-glass opacities. New small left  pleural effusion, without discrete nodule. Musculoskeletal: Overall similar appearance the mixed lytic and osseous lesions visualized throughout the axial and proximal appendicular skeleton. T6 vertebral endplate compression deformity is not significantly changed. CT ABDOMEN PELVIS FINDINGS Hepatobiliary: No focal liver abnormality is seen. No gallstones, gallbladder wall thickening, or biliary dilatation.  Pancreas: Unremarkable. No pancreatic ductal dilatation or surrounding inflammatory changes. Spleen: Normal in size without focal abnormality. Adrenals/Urinary Tract: Bilateral adrenal glands are unremarkable. Right renal sinus cyst. 2 cm left upper pole renal cyst. No hydronephrosis. Urinary bladder is decompressed limiting evaluation. Stomach/Bowel: Stomach is within normal limits. Appendix appears normal. No evidence of bowel wall thickening, distention, or inflammatory changes. Vascular/Lymphatic: Aortic atherosclerosis without aneurysmal dilation. Pathologically enlarged abdominopelvic. Reproductive: Uterus and bilateral adnexa are unremarkable. Other: No abdominopelvic ascites. Musculoskeletal: No significant interval change in the multifocal mixed lytic and sclerotic osseous metastatic lesions throughout the axial and proximal appendicular skeleton IMPRESSION: 1. Overall there is no significant interval change in the ill-defined right upper lobe lesion, right pleural based metastases, bilateral metastatic lung nodules, thoracic adenopathy or the multifocal osseous metastatic lesions. No definite evidence new metastatic disease in the chest, abdomen or pelvis. 2. Interval development of interlobular septal thickening, without nodularity, with adjacent ground-glass opacities within the paramedian right lower lobe. Findings which are nonspecific and possibly related to an infectious or inflammatory process. Attention follow-up imaging suggested. 3. New small left pleural effusion without discrete left-sided  pleural nodularity, attention on follow-up imaging suggested. 4.  Aortic Atherosclerosis (ICD10-I70.0). Electronically Signed   By: Dahlia Bailiff MD   On: 07/05/2021 09:28      Assessment and plan  1. Primary malignant neoplasm of lung metastatic to other site, unspecified laterality ([place])   2. Encounter for antineoplastic chemotherapy   3. Neoplasm related pain   4. 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  cT4 N3 M1-  KRASG12C (Lumakras in subsequent line can be considered) First-line treatment with carboplatin/Alimta/bevacizumab.[cycle 6 carboplatin hypersensitivity-discontinue] 07/04/21 CT showed overall stable disease. Small new left pleural effusion, attention on follow up.  Labs are reviewed and discussed with patient. Proceed with Alimta and bevacizumab today.  B12 injection today  #Bone metastasis.  Status post T2 radiation.  Patient previously declined bisphosphonate Today we discussed again about the rationale and side effects. She is interested. I recommend patient to obtain dental clearance for Zometa.   #Neoplasm related pain/treatment complication-pain appears to be stable. Pain has subsided.   I wanted to repeat brain MRI with and without contrast for surveillance.- xanax PRN for claustrophobia. Patient prefers to defer MRI   # Right lower extremity muscle pain, intermittent. Etiology is unclear.    Lab MD Alimta, bevacizumab, 3 weeks.  [name], MD, PhD Hematology Oncology [facility] Pager- [phone] 07/28/2021

## 2021-08-18 ENCOUNTER — Other Ambulatory Visit: Payer: Self-pay

## 2021-08-18 ENCOUNTER — Inpatient Hospital Stay (HOSPITAL_BASED_OUTPATIENT_CLINIC_OR_DEPARTMENT_OTHER): Payer: BC Managed Care – PPO | Admitting: Oncology

## 2021-08-18 ENCOUNTER — Inpatient Hospital Stay: Payer: BC Managed Care – PPO

## 2021-08-18 ENCOUNTER — Inpatient Hospital Stay: Payer: BC Managed Care – PPO | Attending: Oncology

## 2021-08-18 ENCOUNTER — Encounter: Payer: Self-pay | Admitting: Oncology

## 2021-08-18 VITALS — BP 122/86 | HR 101 | Temp 97.6°F | Resp 16 | Wt 157.8 lb

## 2021-08-18 VITALS — HR 102

## 2021-08-18 DIAGNOSIS — G35 Multiple sclerosis: Secondary | ICD-10-CM | POA: Insufficient documentation

## 2021-08-18 DIAGNOSIS — C7951 Secondary malignant neoplasm of bone: Secondary | ICD-10-CM | POA: Diagnosis not present

## 2021-08-18 DIAGNOSIS — C3490 Malignant neoplasm of unspecified part of unspecified bronchus or lung: Secondary | ICD-10-CM | POA: Insufficient documentation

## 2021-08-18 DIAGNOSIS — E538 Deficiency of other specified B group vitamins: Secondary | ICD-10-CM | POA: Diagnosis not present

## 2021-08-18 DIAGNOSIS — Z5111 Encounter for antineoplastic chemotherapy: Secondary | ICD-10-CM

## 2021-08-18 DIAGNOSIS — G893 Neoplasm related pain (acute) (chronic): Secondary | ICD-10-CM | POA: Insufficient documentation

## 2021-08-18 DIAGNOSIS — Z800 Family history of malignant neoplasm of digestive organs: Secondary | ICD-10-CM | POA: Diagnosis not present

## 2021-08-18 DIAGNOSIS — K219 Gastro-esophageal reflux disease without esophagitis: Secondary | ICD-10-CM | POA: Insufficient documentation

## 2021-08-18 DIAGNOSIS — J910 Malignant pleural effusion: Secondary | ICD-10-CM | POA: Diagnosis not present

## 2021-08-18 DIAGNOSIS — J449 Chronic obstructive pulmonary disease, unspecified: Secondary | ICD-10-CM | POA: Insufficient documentation

## 2021-08-18 DIAGNOSIS — Z5112 Encounter for antineoplastic immunotherapy: Secondary | ICD-10-CM | POA: Insufficient documentation

## 2021-08-18 DIAGNOSIS — Z803 Family history of malignant neoplasm of breast: Secondary | ICD-10-CM | POA: Diagnosis not present

## 2021-08-18 DIAGNOSIS — F17210 Nicotine dependence, cigarettes, uncomplicated: Secondary | ICD-10-CM | POA: Insufficient documentation

## 2021-08-18 LAB — PROTEIN, URINE, RANDOM: Total Protein, Urine: 12 mg/dL

## 2021-08-18 LAB — COMPREHENSIVE METABOLIC PANEL
ALT: 19 U/L (ref 0–44)
AST: 25 U/L (ref 15–41)
Albumin: 3.3 g/dL — ABNORMAL LOW (ref 3.5–5.0)
Alkaline Phosphatase: 83 U/L (ref 38–126)
Anion gap: 9 (ref 5–15)
BUN: 24 mg/dL — ABNORMAL HIGH (ref 8–23)
CO2: 25 mmol/L (ref 22–32)
Calcium: 9.2 mg/dL (ref 8.9–10.3)
Chloride: 103 mmol/L (ref 98–111)
Creatinine, Ser: 0.73 mg/dL (ref 0.44–1.00)
GFR, Estimated: >60 mL/min (ref >60)
Glucose, Bld: 77 mg/dL (ref 70–99)
Potassium: 4.1 mmol/L (ref 3.5–5.1)
Sodium: 137 mmol/L (ref 135–145)
Total Bilirubin: 0.3 mg/dL (ref 0.3–1.2)
Total Protein: 7.8 g/dL (ref 6.5–8.1)

## 2021-08-18 LAB — CBC WITH DIFFERENTIAL/PLATELET
Abs Immature Granulocytes: 0.05 10*3/uL (ref 0.00–0.07)
Basophils Absolute: 0 10*3/uL (ref 0.0–0.1)
Basophils Relative: 0 %
Eosinophils Absolute: 0.1 10*3/uL (ref 0.0–0.5)
Eosinophils Relative: 1 %
HCT: 37.3 % (ref 36.0–46.0)
Hemoglobin: 12.1 g/dL (ref 12.0–15.0)
Immature Granulocytes: 0 %
Lymphocytes Relative: 10 %
Lymphs Abs: 1.1 10*3/uL (ref 0.7–4.0)
MCH: 27.1 pg (ref 26.0–34.0)
MCHC: 32.4 g/dL (ref 30.0–36.0)
MCV: 83.4 fL (ref 80.0–100.0)
Monocytes Absolute: 1 10*3/uL (ref 0.1–1.0)
Monocytes Relative: 9 %
Neutro Abs: 9.1 10*3/uL — ABNORMAL HIGH (ref 1.7–7.7)
Neutrophils Relative %: 80 %
Platelets: 277 10*3/uL (ref 150–400)
RBC: 4.47 MIL/uL (ref 3.87–5.11)
RDW: 17 % — ABNORMAL HIGH (ref 11.5–15.5)
WBC: 11.3 10*3/uL — ABNORMAL HIGH (ref 4.0–10.5)
nRBC: 0 % (ref 0.0–0.2)

## 2021-08-18 MED ORDER — BENZONATATE 100 MG PO CAPS: 100.0000 mg | ORAL_CAPSULE | Freq: Three times a day (TID) | ORAL | 1 refills | Status: DC | PRN

## 2021-08-18 MED ORDER — PROCHLORPERAZINE MALEATE 10 MG PO TABS
10.0000 mg | ORAL_TABLET | Freq: Once | ORAL | Status: AC
  Administered 2021-08-18: 10 mg via ORAL
  Filled 2021-08-18: qty 1

## 2021-08-18 MED ORDER — SODIUM CHLORIDE 0.9 % IV SOLN
1000.0000 mg | Freq: Once | INTRAVENOUS | Status: AC
  Administered 2021-08-18: 1000 mg via INTRAVENOUS
  Filled 2021-08-18: qty 32

## 2021-08-18 MED ORDER — OXYCODONE HCL 5 MG PO TABS: 5.0000 mg | ORAL_TABLET | Freq: Four times a day (QID) | ORAL | 0 refills | Status: DC | PRN

## 2021-08-18 MED ORDER — SODIUM CHLORIDE 0.9 % IV SOLN
500.0000 mg/m2 | Freq: Once | INTRAVENOUS | Status: AC
  Administered 2021-08-18: 900 mg via INTRAVENOUS
  Filled 2021-08-18: qty 20

## 2021-08-18 MED ORDER — SODIUM CHLORIDE 0.9 % IV SOLN
Freq: Once | INTRAVENOUS | Status: AC
  Filled 2021-08-18: qty 250

## 2021-08-18 NOTE — Progress Notes (Signed)
Per MD to proceed with treatment with HR 102. Treatment team updated.   Brittney Fisher [employer]

## 2021-08-18 NOTE — Progress Notes (Signed)
Hematology/Oncology Follow Up Note Brittney Fisher  Telephone:([phone] [phone]  Patient Care Team: Brittney Nab, RN as Oncology Nurse Navigator Brittney Server, MD as Medical Oncologist (Oncology)   Name of the patient: Brittney Fisher  [id number]  1961/07/12   REASON FOR VISIT  follow-up for lung cancer  PERTINENT ONCOLOGY HISTORY 60 y.o. female with past medical history including GERD, COPD, current everyday smoker presents for follow-up of lung mass and pleural effusion. 10/20/2020, patient was brought to ED via EMS due to generalized weakness, dizziness, chest pain shortness of breath.  She was recently diagnosed with COPD approximately 1 month ago by primary care provider.  Also unintentional weight loss during the last few months. Image work-up showed right-sided pleural effusion.  She underwent right thoracentesis.  With removal of 2.4 L of fluid. 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  Metastatic disease to low cervical/thoracic nodes, lung, right pleural space and bones.  Moderate to large right pleural effusion.  SVC narrowing. 10/21/2020 MRI brain is negative for metastasis.  Mild chronic microvascular ischemic changes in the white matter.  Negative for acute infarct Regarding to the SVC narrowing, patient was seen by vascular surgeon and was recommended no intervention inpatient.  Patient to follow-up outpatient with vascular surgeon for evaluation. Patient also was treated for COPD exacerbation with hypoxia on exertion. Qualifies for home oxygen and hospitalist arrange home health and a nebulizer.  Patient was given a course of prednisone taper and empiric Levaquin. Patient was discharged and present today to follow-up with cytology results and further management plan  10/27/2020 PET scan showed right upper lobe primary bronchogenic mass with direct invasion  into mediastinum. Metastatic disease to the right pleural space, lung, bone, nodes of the chest and less so lower leg/upper abdomen. Hypermetabolic him along the course of the right axillary vein with concurrent subtle hyper attenuation within the axillary vein and SVC.  This continues to the level of SVC narrowing.  Suspicious for SVC occlusion and developing thrombus. Moderate right pleural effusion and small pericardial effusion.  # # left supraclavicular mass biopsy- pathology is positive for adenocarcinoma. positive for CK7, with patchy, weak CK20.  They are  negative for TTF1, NapsinA, GATA3, CDX2, and Pax8.  The findings are  nonspecific; but may be compatible with a poorly differentiated  adenocarcinoma of lung origin, especially given the imaging findings  She also had another repeat thoracentesis and fluid cytology showed malignancy.  #NGS showed AXIN 1 [id number], DIS2 M153f, KRAS G12C, PRDM1 M1 [id number], [id number] MS stable, TPS <1%   11/14/2020- 12/12/2020.  #Bronchial obstruction and SVC occlusion.  s/p palliative radiation #After 2 cycles of chemtherapy, patient had CT scan done due to shortness of breath.  01/05/2021, CT chest angiogram PE protocol showed no evidence of pulmonary embolism.  Stable disease.  Right upper lobe mass about 6.7 x 5.3 cm.  Widespread metastatic disease, lung and bone involvement. Interval development of right-sided hydronephrosis. 03/06/2021 finished T2 palliative radiation 03/22/2021, 6 cycles of carboplatin/Alimta/bevacizumab.  Infusion reaction of carboplatin during cycle 6.  Carboplatin discontinued. 04/07/2021 CT chest abdomen pelvis-partial response Mild decrease in size of right lung mass, mildly decreased mediastinal and hilar adenopathy.  Extensive right-sided pleural metastasis unchanged.  Similar appearance of diffuse bilateral pulmonary nodules.  Multifocal lytic sclerotic bone metastasis.  New superior endplate deformity of T6  INTERVAL  HISTORY VZAILYNN BRANDELis a 60y.o. female who has above  history reviewed by me today presents for follow up visit for management of metastatic lung cancer, acute visit for swallowing pain.  Problems and complaints are listed below Patient lost 3 pounds since last visit. She continues to have right lower extremity muscle aches.  Intermittently.  She takes oxycodone, on average 1 to 2 pills/day.  Pain is controlled. Chronic cough, patient takes Tessalon and wants a refill.   Review of Systems  Constitutional:  Negative for chills, fatigue, fever and unexpected weight change.  HENT:   Negative for hearing loss and voice change.   Eyes:  Negative for eye problems.  Respiratory:  Negative for chest tightness, cough and shortness of breath.   Cardiovascular:  Negative for chest pain.  Gastrointestinal:  Negative for abdominal distention, abdominal pain and blood in stool.  Endocrine: Negative for hot flashes.  Genitourinary:  Negative for difficulty urinating and frequency.   Musculoskeletal:  Negative for arthralgias.       Right lower extremity muscle pain  Skin:  Negative for itching and rash.  Neurological:  Negative for extremity weakness.  Hematological:  Negative for adenopathy.  Psychiatric/Behavioral:  Negative for confusion.      Allergies  Allergen Reactions   Carboplatin Shortness Of Breath, Itching and Cough     Past Medical History:  Diagnosis Date   COPD (chronic obstructive pulmonary disease) (HCC)    GERD (gastroesophageal reflux disease)    Metastatic lung cancer (metastasis from lung to other site) ([place]) 11/02/2020     Past Surgical History:  Procedure Laterality Date   COLONOSCOPY WITH ESOPHAGOGASTRODUODENOSCOPY (EGD)     COLONOSCOPY WITH PROPOFOL N/A 11/14/2015   Procedure: COLONOSCOPY WITH PROPOFOL;  Surgeon: Hulen Luster, MD;  Location: [facility] ENDOSCOPY;  Service: Gastroenterology;  Laterality: N/A;   ESOPHAGOGASTRODUODENOSCOPY (EGD) WITH PROPOFOL N/A  11/14/2015   Procedure: ESOPHAGOGASTRODUODENOSCOPY (EGD) WITH PROPOFOL;  Surgeon: Hulen Luster, MD;  Location: [facility] ENDOSCOPY;  Service: Gastroenterology;  Laterality: N/A;    Social History   Socioeconomic History   Marital status: Married    Spouse name: Not on file   Number of children: Not on file   Years of education: Not on file   Highest education level: Not on file  Occupational History   Not on file  Tobacco Use   Smoking status: Every Day    Packs/day: 0.20    Types: Cigarettes   Smokeless tobacco: Never  Substance and Sexual Activity   Alcohol use: Never   Drug use: Never   Sexual activity: Not Currently  Other Topics Concern   Not on file  Social History Narrative   Not on file   Social Determinants of Health   Financial Resource Strain: Not on file  Food Insecurity: Not on file  Transportation Needs: Not on file  Physical Activity: Not on file  Stress: Not on file  Social Connections: Not on file  Intimate Partner Violence: Not on file    Family History  Problem Relation Age of Onset   Breast cancer Cousin 43   Diabetes type II Mother    Hypertension Mother    Colon cancer Father    Hypertension Father      Current Outpatient Medications:    acetaminophen (TYLENOL) 500 MG tablet, Take 1,000 mg by mouth every 8 (eight) hours as needed for moderate pain. , Disp: , Rfl:    dexamethasone (DECADRON) 4 MG tablet, Take 1 tab (4 mg) twice a day the day before Alimta chemo. Take 2 tablets (8  mg) po daily x 3 days starting the day after chemo., Disp: 30 tablet, Rfl: 1   folic acid (FOLVITE) 1 MG tablet, Take 1 tablet (1 mg total) by mouth daily. Start 5-7 days before Alimta chemotherapy. Continue until 21 days after Alimta completed., Disp: 100 tablet, Rfl: 3   guaiFENesin (MUCINEX) 600 MG 12 hr tablet, Take 1 tablet (600 mg total) by mouth 2 (two) times daily., Disp: 14 tablet, Rfl: 0   ipratropium-albuterol (DUONEB) 0.5-2.5 (3) MG/3ML SOLN, Take 3 mLs by  nebulization 3 (three) times daily., Disp: 360 mL, Rfl: 1   magic mouthwash w/lidocaine SOLN, Take 5 mLs by mouth 4 (four) times daily as needed for mouth pain. Sig: Swish/Swallow 5-10 ml four times a day as needed. Dispense 480 ml. 1RF, Disp: 480 mL, Rfl: 1   omeprazole (PRILOSEC) 20 MG capsule, Take 1 capsule (20 mg total) by mouth 2 (two) times daily before a meal., Disp: 60 capsule, Rfl: 1   Polyethylene Glycol 3350 (MIRALAX PO), Take by mouth daily as needed., Disp: , Rfl:    protein supplement shake (PREMIER PROTEIN) LIQD, Take 2 oz by mouth 4 (four) times daily., Disp: , Rfl:    senna-docusate (SENOKOT-S) 8.6-50 MG tablet, Take 2 tablets by mouth daily., Disp: 60 tablet, Rfl: 3   sucralfate (CARAFATE) 1 GM/10ML suspension, Take 10 mLs (1 g total) by mouth in the morning, at noon, and at bedtime. (Patient taking differently: Take 1 g by mouth 3 (three) times daily as needed.), Disp: 420 mL, Rfl: 0   benzonatate (TESSALON) 100 MG capsule, Take 1 capsule (100 mg total) by mouth 3 (three) times daily as needed for cough., Disp: 60 capsule, Rfl: 1   ondansetron (ZOFRAN) 8 MG tablet, Take 1 tablet (8 mg total) by mouth 2 (two) times daily as needed for refractory nausea / vomiting. Start on day 3 after chemo. (Patient not taking: No sig reported), Disp: 30 tablet, Rfl: 1   oxyCODONE (OXY IR/ROXICODONE) 5 MG immediate release tablet, Take 1 tablet (5 mg total) by mouth every 6 (six) hours as needed for moderate pain or severe pain., Disp: 60 tablet, Rfl: 0   prochlorperazine (COMPAZINE) 10 MG tablet, Take 1 tablet (10 mg total) by mouth every 6 (six) hours as needed (Nausea or vomiting). (Patient not taking: Reported on 08/18/2021), Disp: 30 tablet, Rfl: 1   tiotropium (SPIRIVA HANDIHALER) 18 MCG inhalation capsule, Place 18 mcg into inhaler and inhale daily. (Patient not taking: Reported on 08/18/2021), Disp: , Rfl:   Physical exam: ECOG 1 Vitals:   08/18/21 0918  BP: 122/86  Pulse: (!) 101  Resp: 16   Temp: 97.6 F (36.4 C)  TempSrc: Tympanic  SpO2: 100%  Weight: 157 lb 12.8 oz (71.6 kg)   Physical Exam Constitutional:      General: She is not in acute distress.    Comments: Thin built female walks independently  HENT:     Head: Normocephalic and atraumatic.  Eyes:     General: No scleral icterus. Cardiovascular:     Rate and Rhythm: Normal rate and regular rhythm.     Heart sounds: Normal heart sounds.  Pulmonary:     Effort: Pulmonary effort is normal. No respiratory distress.     Breath sounds: No wheezing.     Comments: Slightly decreased breath sound bilaterally.  Abdominal:     General: Bowel sounds are normal. There is no distension.     Palpations: Abdomen is soft.  Musculoskeletal:  General: No deformity. Normal range of motion.     Cervical back: Normal range of motion and neck supple.  Skin:    General: Skin is warm and dry.     Findings: No erythema or rash.  Neurological:     Mental Status: She is alert and oriented to person, place, and time. Mental status is at baseline.     Cranial Nerves: No cranial nerve deficit.     Coordination: Coordination normal.  Psychiatric:        Mood and Affect: Mood normal.    CMP Latest Ref Rng & Units 08/18/2021  Glucose 70 - 99 mg/dL 77  BUN 8 - 23 mg/dL 24(H)  Creatinine 0.44 - 1.00 mg/dL 0.73  Sodium 135 - 145 mmol/L 137  Potassium 3.5 - 5.1 mmol/L 4.1  Chloride 98 - 111 mmol/L 103  CO2 22 - 32 mmol/L 25  Calcium 8.9 - 10.3 mg/dL 9.2  Total Protein 6.5 - 8.1 g/dL 7.8  Total Bilirubin 0.3 - 1.2 mg/dL 0.3  Alkaline Phos 38 - 126 U/L 83  AST 15 - 41 U/L 25  ALT 0 - 44 U/L 19   CBC Latest Ref Rng & Units 08/18/2021  WBC 4.0 - 10.5 K/uL 11.3(H)  Hemoglobin 12.0 - 15.0 g/dL 12.1  Hematocrit 36.0 - 46.0 % 37.3  Platelets 150 - 400 K/uL 277    RADIOGRAPHIC STUDIES: I have personally reviewed the radiological images as listed and agreed with the findings in the report. CT CHEST ABDOMEN PELVIS W  CONTRAST  Result Date: 07/05/2021 CLINICAL DATA:  Lung cancer, restaging. EXAM: CT CHEST, ABDOMEN, AND PELVIS WITH CONTRAST TECHNIQUE: Multidetector CT imaging of the chest, abdomen and pelvis was performed following the standard protocol during bolus administration of intravenous contrast. CONTRAST:  40m OMNIPAQUE IOHEXOL 350 MG/ML SOLN COMPARISON:  Multiple priors including most recent CT February 07, 2021 FINDINGS: CT CHEST FINDINGS Cardiovascular: Aortic and branch vessel atherosclerosis without aneurysmal dilation. No central pulmonary embolus. Normal size heart. No significant pericardial effusion/thickening. Mediastinum/Nodes: Similar extensive thoracic adenopathy. Index lesions are as follows: Right paratracheal lymph node measures 2.3 cm, image 20/2, unchanged. Low right paratracheal lymph node measures 1.8 cm on image 26/2, unchanged. Left pre vascular lymph node measures 1.3 cm on image 26/2, unchanged. Subcarinal node measures 1.8 cm on image 28/2, previously 1.9 cm. Right hilar node measures 1.4 cm on image 32/2, unchanged. Left hilar node measures 1.4 cm on image 38/2, unchanged. Lungs/Pleura: Similar appearance of the nodular soft tissue rind encasing lung pleural metastatic disease. Similar right upper lobe atelectasis. As on multiple prior examinations the margin of the right lung are difficult to delineate, subjectively the lesion appears stable from prior and by best measurable approximation on this study the lesion measures 8.3 x 3.8 cm on image 20/2 previously 8.2 x 4.8 cm. No significant interval change in size and number of the numerous scattered bilateral metastatic lung nodules. Increase in the consolidated left lower lobe favored represent postobstructive atelectasis. Within the paramedian right lower lobe there is a development interlobular septal thickening, without nodularity, with adjacent ground-glass opacities. New small left pleural effusion, without discrete nodule.  Musculoskeletal: Overall similar appearance the mixed lytic and osseous lesions visualized throughout the axial and proximal appendicular skeleton. T6 vertebral endplate compression deformity is not significantly changed. CT ABDOMEN PELVIS FINDINGS Hepatobiliary: No focal liver abnormality is seen. No gallstones, gallbladder wall thickening, or biliary dilatation. Pancreas: Unremarkable. No pancreatic ductal dilatation or surrounding inflammatory changes. Spleen: Normal in  size without focal abnormality. Adrenals/Urinary Tract: Bilateral adrenal glands are unremarkable. Right renal sinus cyst. 2 cm left upper pole renal cyst. No hydronephrosis. Urinary bladder is decompressed limiting evaluation. Stomach/Bowel: Stomach is within normal limits. Appendix appears normal. No evidence of bowel wall thickening, distention, or inflammatory changes. Vascular/Lymphatic: Aortic atherosclerosis without aneurysmal dilation. Pathologically enlarged abdominopelvic. Reproductive: Uterus and bilateral adnexa are unremarkable. Other: No abdominopelvic ascites. Musculoskeletal: No significant interval change in the multifocal mixed lytic and sclerotic osseous metastatic lesions throughout the axial and proximal appendicular skeleton IMPRESSION: 1. Overall there is no significant interval change in the ill-defined right upper lobe lesion, right pleural based metastases, bilateral metastatic lung nodules, thoracic adenopathy or the multifocal osseous metastatic lesions. No definite evidence new metastatic disease in the chest, abdomen or pelvis. 2. Interval development of interlobular septal thickening, without nodularity, with adjacent ground-glass opacities within the paramedian right lower lobe. Findings which are nonspecific and possibly related to an infectious or inflammatory process. Attention follow-up imaging suggested. 3. New small left pleural effusion without discrete left-sided pleural nodularity, attention on follow-up  imaging suggested. 4.  Aortic Atherosclerosis (ICD10-I70.0). Electronically Signed   By: Dahlia Bailiff MD   On: 07/05/2021 09:28       Assessment and plan  1. Encounter for antineoplastic chemotherapy   2. Neoplasm related pain   3. Bone metastasis ([place])   4. 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  cT4 N3 M1-  KRASG12C (Lumakras in subsequent line can be considered) First-line treatment with carboplatin/Alimta/bevacizumab.[cycle 6 carboplatin hypersensitivity-discontinue] 07/04/21 CT showed overall stable disease. Small new left pleural effusion, attention on follow up.  Labs reviewed and discussed with patient. Proceed with Alimta and bevacizumab today  #Bone metastasis.  Status post T2 radiation.  Patient previously declined bisphosphonate Today we discussed again about the rationale and side effects. She is interested.  Awaiting dental clearance.  #Neoplasm related pain/treatment complication-pain appears to be stable. I wanted to repeat brain MRI with and without contrast for surveillance.- xanax PRN for claustrophobia. Patient prefers to defer MRI   # Right lower extremity muscle pain, intermittent. Etiology is unclear.  Continue monitor symptoms.  Can consider bone density discussed with her in the future if pain persist.   Lab MD Alimta, bevacizumab, 3 weeks.  Brittney Server, MD, PhD Hematology Oncology [place] at [facility]  08/18/2021

## 2021-08-18 NOTE — Patient Instructions (Addendum)
[facility] ONCOLOGY  Discharge Instructions: Thank you for choosing [facility] to provide your oncology and hematology care.  If you have a lab appointment with the [facility], please go directly to the [facility] and check in at the registration area.  Wear comfortable clothing and clothing appropriate for easy access to any Portacath or PICC line.   We strive to give you quality time with your provider. You may need to reschedule your appointment if you arrive late (15 or more minutes).  Arriving late affects you and other patients whose appointments are after yours.  Also, if you miss three or more appointments without notifying the office, you may be dismissed from the clinic at the provider's discretion.      For prescription refill requests, have your pharmacy contact our office and allow 72 hours for refills to be completed.    Today you received the following chemotherapy and/or immunotherapy agents Avastin, Alimta    To help prevent nausea and vomiting after your treatment, we encourage you to take your nausea medication as directed.  BELOW ARE SYMPTOMS THAT SHOULD BE REPORTED IMMEDIATELY: *FEVER GREATER THAN 100.4 F (38 C) OR HIGHER *CHILLS OR SWEATING *NAUSEA AND VOMITING THAT IS NOT CONTROLLED WITH YOUR NAUSEA MEDICATION *UNUSUAL SHORTNESS OF BREATH *UNUSUAL BRUISING OR BLEEDING *URINARY PROBLEMS (pain or burning when urinating, or frequent urination) *BOWEL PROBLEMS (unusual diarrhea, constipation, pain near the anus) TENDERNESS IN MOUTH AND THROAT WITH OR WITHOUT PRESENCE OF ULCERS (sore throat, sores in mouth, or a toothache) UNUSUAL RASH, SWELLING OR PAIN  UNUSUAL VAGINAL DISCHARGE OR ITCHING   Items with * indicate a potential emergency and should be followed up as soon as possible or go to the Emergency Department if any problems should occur.  Please show the CHEMOTHERAPY ALERT CARD or IMMUNOTHERAPY ALERT CARD at  check-in to the Emergency Department and triage nurse.  Should you have questions after your visit or need to cancel or reschedule your appointment, please contact [facility]  [phone] and follow the prompts.  Office hours are 8:00 a.m. to 4:30 p.m. Monday - Friday. Please note that voicemails left after 4:00 p.m. may not be returned until the following business day.  We are closed weekends and major holidays. You have access to a nurse at all times for urgent questions. Please call the main number to the clinic [phone] and follow the prompts.  For any non-urgent questions, you may also contact your provider using MyChart. We now offer e-Visits for anyone 31 and older to request care online for non-urgent symptoms. For details visit [email or web address].   Also download the MyChart app! Go to the app store, search "MyChart", open the app, select [facility], and log in with your MyChart username and password.  Due to Covid, a mask is required upon entering the hospital/clinic. If you do not have a mask, one will be given to you upon arrival. For doctor visits, patients may have 1 support person aged 40 or older with them. For treatment visits, patients cannot have anyone with them due to current Covid guidelines and our immunocompromised population.

## 2021-08-18 NOTE — Progress Notes (Signed)
Pt in for follow up, reports decreased appetite and 3 lb weight loss. Pt reports pain in right leg is a 10 at times.

## 2021-09-07 ENCOUNTER — Other Ambulatory Visit: Payer: Self-pay

## 2021-09-07 DIAGNOSIS — C3490 Malignant neoplasm of unspecified part of unspecified bronchus or lung: Secondary | ICD-10-CM

## 2021-09-08 ENCOUNTER — Inpatient Hospital Stay: Payer: BC Managed Care – PPO

## 2021-09-08 ENCOUNTER — Inpatient Hospital Stay (HOSPITAL_BASED_OUTPATIENT_CLINIC_OR_DEPARTMENT_OTHER): Payer: BC Managed Care – PPO | Admitting: Oncology

## 2021-09-08 ENCOUNTER — Encounter: Payer: Self-pay | Admitting: Oncology

## 2021-09-08 VITALS — BP 139/81 | HR 101 | Temp 97.9°F | Resp 17 | Wt 159.0 lb

## 2021-09-08 DIAGNOSIS — C7951 Secondary malignant neoplasm of bone: Secondary | ICD-10-CM | POA: Diagnosis not present

## 2021-09-08 DIAGNOSIS — Z5111 Encounter for antineoplastic chemotherapy: Secondary | ICD-10-CM | POA: Diagnosis not present

## 2021-09-08 DIAGNOSIS — C3490 Malignant neoplasm of unspecified part of unspecified bronchus or lung: Secondary | ICD-10-CM | POA: Diagnosis not present

## 2021-09-08 DIAGNOSIS — Z5112 Encounter for antineoplastic immunotherapy: Secondary | ICD-10-CM | POA: Diagnosis not present

## 2021-09-08 DIAGNOSIS — G893 Neoplasm related pain (acute) (chronic): Secondary | ICD-10-CM

## 2021-09-08 LAB — COMPREHENSIVE METABOLIC PANEL
ALT: 20 U/L (ref 0–44)
AST: 26 U/L (ref 15–41)
Albumin: 3.1 g/dL — ABNORMAL LOW (ref 3.5–5.0)
Alkaline Phosphatase: 76 U/L (ref 38–126)
Anion gap: 11 (ref 5–15)
BUN: 17 mg/dL (ref 8–23)
CO2: 26 mmol/L (ref 22–32)
Calcium: 8.8 mg/dL — ABNORMAL LOW (ref 8.9–10.3)
Chloride: 102 mmol/L (ref 98–111)
Creatinine, Ser: 0.67 mg/dL (ref 0.44–1.00)
GFR, Estimated: >60 mL/min (ref >60)
Glucose, Bld: 90 mg/dL (ref 70–99)
Potassium: 3.5 mmol/L (ref 3.5–5.1)
Sodium: 139 mmol/L (ref 135–145)
Total Bilirubin: 0.4 mg/dL (ref 0.3–1.2)
Total Protein: 7.4 g/dL (ref 6.5–8.1)

## 2021-09-08 LAB — CBC WITH DIFFERENTIAL/PLATELET
Abs Immature Granulocytes: 0.02 10*3/uL (ref 0.00–0.07)
Basophils Absolute: 0 10*3/uL (ref 0.0–0.1)
Basophils Relative: 0 %
Eosinophils Absolute: 0.1 10*3/uL (ref 0.0–0.5)
Eosinophils Relative: 1 %
HCT: 34.8 % — ABNORMAL LOW (ref 36.0–46.0)
Hemoglobin: 11 g/dL — ABNORMAL LOW (ref 12.0–15.0)
Immature Granulocytes: 0 %
Lymphocytes Relative: 13 %
Lymphs Abs: 0.8 10*3/uL (ref 0.7–4.0)
MCH: 26.7 pg (ref 26.0–34.0)
MCHC: 31.6 g/dL (ref 30.0–36.0)
MCV: 84.5 fL (ref 80.0–100.0)
Monocytes Absolute: 0.7 10*3/uL (ref 0.1–1.0)
Monocytes Relative: 11 %
Neutro Abs: 4.2 10*3/uL (ref 1.7–7.7)
Neutrophils Relative %: 75 %
Platelets: 273 10*3/uL (ref 150–400)
RBC: 4.12 MIL/uL (ref 3.87–5.11)
RDW: 17.2 % — ABNORMAL HIGH (ref 11.5–15.5)
WBC: 5.7 10*3/uL (ref 4.0–10.5)
nRBC: 0 % (ref 0.0–0.2)

## 2021-09-08 LAB — PROTEIN, URINE, RANDOM: Total Protein, Urine: <6 mg/dL

## 2021-09-08 MED ORDER — PROCHLORPERAZINE MALEATE 10 MG PO TABS
10.0000 mg | ORAL_TABLET | Freq: Once | ORAL | Status: AC
  Administered 2021-09-08: 10 mg via ORAL
  Filled 2021-09-08: qty 1

## 2021-09-08 MED ORDER — SODIUM CHLORIDE 0.9 % IV SOLN
500.0000 mg/m2 | Freq: Once | INTRAVENOUS | Status: AC
  Administered 2021-09-08: 900 mg via INTRAVENOUS
  Filled 2021-09-08: qty 20

## 2021-09-08 MED ORDER — OXYCODONE HCL 5 MG PO TABS: 5.0000 mg | ORAL_TABLET | Freq: Four times a day (QID) | ORAL | 0 refills | Status: DC | PRN

## 2021-09-08 MED ORDER — SODIUM CHLORIDE 0.9 % IV SOLN
Freq: Once | INTRAVENOUS | Status: AC
  Filled 2021-09-08: qty 250

## 2021-09-08 MED ORDER — SODIUM CHLORIDE 0.9 % IV SOLN
1000.0000 mg | Freq: Once | INTRAVENOUS | Status: AC
  Administered 2021-09-08: 1000 mg via INTRAVENOUS
  Filled 2021-09-08: qty 32

## 2021-09-08 NOTE — Progress Notes (Signed)
Hematology/Oncology Follow Up Note [facility]  [phone] [phone]  Patient Care Team: Pcp, No as PCP - General Telford Nab, RN as Oncology Nurse Navigator [name], MD as Medical Oncologist (Oncology)   Name of the patient: Brittney Fisher  [id number]  Oct 09, 1961   REASON FOR VISIT  follow-up for lung cancer  PERTINENT ONCOLOGY HISTORY 60 y.o. female with past medical history including GERD, COPD, current everyday smoker presents for follow-up of lung mass and pleural effusion. 10/20/2020, patient was brought to ED via EMS due to generalized weakness, dizziness, chest pain shortness of breath.  She was recently diagnosed with COPD approximately 1 month ago by primary care provider.  Also unintentional weight loss during the last few months. Image work-up showed right-sided pleural effusion.  She underwent right thoracentesis.  With removal of 2.4 L of fluid. 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  Metastatic disease to low cervical/thoracic nodes, lung, right pleural space and bones.  Moderate to large right pleural effusion.  SVC narrowing. 10/21/2020 MRI brain is negative for metastasis.  Mild chronic microvascular ischemic changes in the white matter.  Negative for acute infarct Regarding to the SVC narrowing, patient was seen by vascular surgeon and was recommended no intervention inpatient.  Patient to follow-up outpatient with vascular surgeon for evaluation. Patient also was treated for COPD exacerbation with hypoxia on exertion. Qualifies for home oxygen and hospitalist arrange home health and a nebulizer.  Patient was given a course of prednisone taper and empiric Levaquin. Patient was discharged and present today to follow-up with cytology results and further management plan  10/27/2020 PET scan showed right upper lobe primary bronchogenic  mass with direct invasion into mediastinum. Metastatic disease to the right pleural space, lung, bone, nodes of the chest and less so lower leg/upper abdomen. Hypermetabolic him along the course of the right axillary vein with concurrent subtle hyper attenuation within the axillary vein and SVC.  This continues to the level of SVC narrowing.  Suspicious for SVC occlusion and developing thrombus. Moderate right pleural effusion and small pericardial effusion.  # # left supraclavicular mass biopsy- pathology is positive for adenocarcinoma. positive for CK7, with patchy, weak CK20.  They are  negative for TTF1, NapsinA, GATA3, CDX2, and Pax8.  The findings are  nonspecific; but may be compatible with a poorly differentiated  adenocarcinoma of lung origin, especially given the imaging findings  She also had another repeat thoracentesis and fluid cytology showed malignancy.  #NGS showed AXIN 1 [id number], DIS2 M153f, KRAS G12C, PRDM1 M1 [id number], [id number] MS stable, TPS <1%   11/14/2020- 12/12/2020.  #Bronchial obstruction and SVC occlusion.  s/p palliative radiation #After 2 cycles of chemtherapy, patient had CT scan done due to shortness of breath.  01/05/2021, CT chest angiogram PE protocol showed no evidence of pulmonary embolism.  Stable disease.  Right upper lobe mass about 6.7 x 5.3 cm.  Widespread metastatic disease, lung and bone involvement. Interval development of right-sided hydronephrosis. 03/06/2021 finished T2 palliative radiation 03/22/2021, 6 cycles of carboplatin/Alimta/bevacizumab.  Infusion reaction of carboplatin during cycle 6.  Carboplatin discontinued. 04/07/2021 CT chest abdomen pelvis-partial response Mild decrease in size of right lung mass, mildly decreased mediastinal and hilar adenopathy.  Extensive right-sided pleural metastasis unchanged.  Similar appearance of diffuse bilateral pulmonary nodules.  Multifocal lytic sclerotic bone metastasis.  New superior endplate  deformity of T6  INTERVAL HISTORY Brittney NAJERAis a  60 y.o. female who has above history reviewed by me today presents for follow up visit for management of metastatic lung cancer, acute visit for swallowing pain.  Problems and complaints are listed below Patient has gained weight.  She continues to have right lower extremity pain, intermittent, " the pains moves around".  She take oxycodone 23m PRN with symptoms relieved.   Review of Systems  Constitutional:  Negative for chills, fatigue, fever and unexpected weight change.  HENT:   Negative for hearing loss and voice change.   Eyes:  Negative for eye problems.  Respiratory:  Negative for chest tightness, cough and shortness of breath.   Cardiovascular:  Negative for chest pain.  Gastrointestinal:  Negative for abdominal distention, abdominal pain and blood in stool.  Endocrine: Negative for hot flashes.  Genitourinary:  Negative for difficulty urinating and frequency.   Musculoskeletal:  Negative for arthralgias.       Right lower extremity muscle pain  Skin:  Negative for itching and rash.  Neurological:  Negative for extremity weakness.  Hematological:  Negative for adenopathy.  Psychiatric/Behavioral:  Negative for confusion.      Allergies  Allergen Reactions   Carboplatin Shortness Of Breath, Itching and Cough     Past Medical History:  Diagnosis Date   COPD (chronic obstructive pulmonary disease) (HCC)    GERD (gastroesophageal reflux disease)    Metastatic lung cancer (metastasis from lung to other site) ([facility] 11/02/2020     Past Surgical History:  Procedure Laterality Date   COLONOSCOPY WITH ESOPHAGOGASTRODUODENOSCOPY (EGD)     COLONOSCOPY WITH PROPOFOL N/A 11/14/2015   Procedure: COLONOSCOPY WITH PROPOFOL;  Surgeon: PHulen Luster MD;  [facility]  Service: Gastroenterology;  Laterality: N/A;   ESOPHAGOGASTRODUODENOSCOPY (EGD) WITH PROPOFOL N/A 11/14/2015   Procedure: ESOPHAGOGASTRODUODENOSCOPY  (EGD) WITH PROPOFOL;  Surgeon: PHulen Luster MD;  [facility];  Service: Gastroenterology;  Laterality: N/A;    Social History   Socioeconomic History   Marital status: Married    Spouse name: Not on file   Number of children: Not on file   Years of education: Not on file   Highest education level: Not on file  Occupational History   Not on file  Tobacco Use   Smoking status: Every Day    Packs/day: 0.20    Types: Cigarettes   Smokeless tobacco: Never  Substance and Sexual Activity   Alcohol use: Never   Drug use: Never   Sexual activity: Not Currently  Other Topics Concern   Not on file  Social History Narrative   Not on file   Social Determinants of Health   Financial Resource Strain: Not on file  Food Insecurity: Not on file  Transportation Needs: Not on file  Physical Activity: Not on file  Stress: Not on file  Social Connections: Not on file  Intimate Partner Violence: Not on file    Family History  Problem Relation Age of Onset   Breast cancer Cousin 458  Diabetes type II Mother    Hypertension Mother    Colon cancer Father    Hypertension Father      Current Outpatient Medications:    acetaminophen (TYLENOL) 500 MG tablet, Take 1,000 mg by mouth every 8 (eight) hours as needed for moderate pain. , Disp: , Rfl:    benzonatate (TESSALON) 100 MG capsule, Take 1 capsule (100 mg total) by mouth 3 (three) times daily as needed for cough., Disp: 60 capsule, Rfl: 1   dexamethasone (  DECADRON) 4 MG tablet, Take 1 tab (4 mg) twice a day the day before Alimta chemo. Take 2 tablets (8 mg) po daily x 3 days starting the day after chemo., Disp: 30 tablet, Rfl: 1   folic acid (FOLVITE) 1 MG tablet, Take 1 tablet (1 mg total) by mouth daily. Start 5-7 days before Alimta chemotherapy. Continue until 21 days after Alimta completed., Disp: 100 tablet, Rfl: 3   guaiFENesin (MUCINEX) 600 MG 12 hr tablet, Take 1 tablet (600 mg total) by mouth 2 (two) times daily., Disp:  14 tablet, Rfl: 0   ipratropium-albuterol (DUONEB) 0.5-2.5 (3) MG/3ML SOLN, Take 3 mLs by nebulization 3 (three) times daily., Disp: 360 mL, Rfl: 1   magic mouthwash w/lidocaine SOLN, Take 5 mLs by mouth 4 (four) times daily as needed for mouth pain. Sig: Swish/Swallow 5-10 ml four times a day as needed. Dispense 480 ml. 1RF, Disp: 480 mL, Rfl: 1   omeprazole (PRILOSEC) 20 MG capsule, Take 1 capsule (20 mg total) by mouth 2 (two) times daily before a meal., Disp: 60 capsule, Rfl: 1   ondansetron (ZOFRAN) 8 MG tablet, Take 1 tablet (8 mg total) by mouth 2 (two) times daily as needed for refractory nausea / vomiting. Start on day 3 after chemo., Disp: 30 tablet, Rfl: 1   Polyethylene Glycol 3350 (MIRALAX PO), Take by mouth daily as needed., Disp: , Rfl:    protein supplement shake (PREMIER PROTEIN) LIQD, Take 2 oz by mouth 4 (four) times daily., Disp: , Rfl:    senna-docusate (SENOKOT-S) 8.6-50 MG tablet, Take 2 tablets by mouth daily., Disp: 60 tablet, Rfl: 3   sucralfate (CARAFATE) 1 GM/10ML suspension, Take 10 mLs (1 g total) by mouth in the morning, at noon, and at bedtime. (Patient taking differently: Take 1 g by mouth 3 (three) times daily as needed.), Disp: 420 mL, Rfl: 0   oxyCODONE (OXY IR/ROXICODONE) 5 MG immediate release tablet, Take 1 tablet (5 mg total) by mouth every 6 (six) hours as needed for moderate pain or severe pain., Disp: 120 tablet, Rfl: 0   prochlorperazine (COMPAZINE) 10 MG tablet, Take 1 tablet (10 mg total) by mouth every 6 (six) hours as needed (Nausea or vomiting). (Patient not taking: No sig reported), Disp: 30 tablet, Rfl: 1   tiotropium (SPIRIVA HANDIHALER) 18 MCG inhalation capsule, Place 18 mcg into inhaler and inhale daily. (Patient not taking: No sig reported), Disp: , Rfl:  No current facility-administered medications for this visit.  Facility-Administered Medications Ordered in Other Visits:    bevacizumab-bvzr (ZIRABEV) 1,000 mg in sodium chloride 0.9 % 100 mL  chemo infusion, 1,000 mg, Intravenous, Once, [name], MD   PEMEtrexed (ALIMTA) 900 mg in sodium chloride 0.9 % 100 mL chemo infusion, 500 mg/m2 (Order-Specific), Intravenous, Once, [name], MD  Physical exam: ECOG 1 Vitals:   09/08/21 0956  BP: 139/81  Pulse: (!) 101  Resp: 17  Temp: 97.9 F (36.6 C)  TempSrc: Tympanic  SpO2: 99%  Weight: 159 lb (72.1 kg)   Physical Exam Constitutional:      General: She is not in acute distress.    Comments: Thin built female walks independently  HENT:     Head: Normocephalic and atraumatic.  Eyes:     General: No scleral icterus. Cardiovascular:     Rate and Rhythm: Normal rate and regular rhythm.     Heart sounds: Normal heart sounds.  Pulmonary:     Effort: Pulmonary effort is normal. No respiratory distress.  Breath sounds: No wheezing.     Comments: Slightly decreased breath sound bilaterally.  Abdominal:     General: Bowel sounds are normal. There is no distension.     Palpations: Abdomen is soft.  Musculoskeletal:        General: No deformity. Normal range of motion.     Cervical back: Normal range of motion and neck supple.  Skin:    General: Skin is warm and dry.     Findings: No erythema or rash.  Neurological:     Mental Status: She is alert and oriented to person, place, and time. Mental status is at baseline.     Cranial Nerves: No cranial nerve deficit.     Coordination: Coordination normal.  Psychiatric:        Mood and Affect: Mood normal.    CMP Latest Ref Rng & Units 09/08/2021  Glucose 70 - 99 mg/dL 90  BUN 8 - 23 mg/dL 17  Creatinine 0.44 - 1.00 mg/dL 0.67  Sodium 135 - 145 mmol/L 139  Potassium 3.5 - 5.1 mmol/L 3.5  Chloride 98 - 111 mmol/L 102  CO2 22 - 32 mmol/L 26  Calcium 8.9 - 10.3 mg/dL 8.8(L)  Total Protein 6.5 - 8.1 g/dL 7.4  Total Bilirubin 0.3 - 1.2 mg/dL 0.4  Alkaline Phos 38 - 126 U/L 76  AST 15 - 41 U/L 26  ALT 0 - 44 U/L 20   CBC Latest Ref Rng & Units 09/08/2021  WBC 4.0 - 10.5  K/uL 5.7  Hemoglobin 12.0 - 15.0 g/dL 11.0(L)  Hematocrit 36.0 - 46.0 % 34.8(L)  Platelets 150 - 400 K/uL 273    RADIOGRAPHIC STUDIES: I have personally reviewed the radiological images as listed and agreed with the findings in the report. CT CHEST ABDOMEN PELVIS W CONTRAST  Result Date: 07/05/2021 CLINICAL DATA:  Lung cancer, restaging. EXAM: CT CHEST, ABDOMEN, AND PELVIS WITH CONTRAST TECHNIQUE: Multidetector CT imaging of the chest, abdomen and pelvis was performed following the standard protocol during bolus administration of intravenous contrast. CONTRAST:  83m OMNIPAQUE IOHEXOL 350 MG/ML SOLN COMPARISON:  Multiple priors including most recent CT February 07, 2021 FINDINGS: CT CHEST FINDINGS Cardiovascular: Aortic and branch vessel atherosclerosis without aneurysmal dilation. No central pulmonary embolus. Normal size heart. No significant pericardial effusion/thickening. Mediastinum/Nodes: Similar extensive thoracic adenopathy. Index lesions are as follows: Right paratracheal lymph node measures 2.3 cm, image 20/2, unchanged. Low right paratracheal lymph node measures 1.8 cm on image 26/2, unchanged. Left pre vascular lymph node measures 1.3 cm on image 26/2, unchanged. Subcarinal node measures 1.8 cm on image 28/2, previously 1.9 cm. Right hilar node measures 1.4 cm on image 32/2, unchanged. Left hilar node measures 1.4 cm on image 38/2, unchanged. Lungs/Pleura: Similar appearance of the nodular soft tissue rind encasing lung pleural metastatic disease. Similar right upper lobe atelectasis. As on multiple prior examinations the margin of the right lung are difficult to delineate, subjectively the lesion appears stable from prior and by best measurable approximation on this study the lesion measures 8.3 x 3.8 cm on image 20/2 previously 8.2 x 4.8 cm. No significant interval change in size and number of the numerous scattered bilateral metastatic lung nodules. Increase in the consolidated left lower  lobe favored represent postobstructive atelectasis. Within the paramedian right lower lobe there is a development interlobular septal thickening, without nodularity, with adjacent ground-glass opacities. New small left pleural effusion, without discrete nodule. Musculoskeletal: Overall similar appearance the mixed lytic and osseous lesions visualized throughout the  axial and proximal appendicular skeleton. T6 vertebral endplate compression deformity is not significantly changed. CT ABDOMEN PELVIS FINDINGS Hepatobiliary: No focal liver abnormality is seen. No gallstones, gallbladder wall thickening, or biliary dilatation. Pancreas: Unremarkable. No pancreatic ductal dilatation or surrounding inflammatory changes. Spleen: Normal in size without focal abnormality. Adrenals/Urinary Tract: Bilateral adrenal glands are unremarkable. Right renal sinus cyst. 2 cm left upper pole renal cyst. No hydronephrosis. Urinary bladder is decompressed limiting evaluation. Stomach/Bowel: Stomach is within normal limits. Appendix appears normal. No evidence of bowel wall thickening, distention, or inflammatory changes. Vascular/Lymphatic: Aortic atherosclerosis without aneurysmal dilation. Pathologically enlarged abdominopelvic. Reproductive: Uterus and bilateral adnexa are unremarkable. Other: No abdominopelvic ascites. Musculoskeletal: No significant interval change in the multifocal mixed lytic and sclerotic osseous metastatic lesions throughout the axial and proximal appendicular skeleton IMPRESSION: 1. Overall there is no significant interval change in the ill-defined right upper lobe lesion, right pleural based metastases, bilateral metastatic lung nodules, thoracic adenopathy or the multifocal osseous metastatic lesions. No definite evidence new metastatic disease in the chest, abdomen or pelvis. 2. Interval development of interlobular septal thickening, without nodularity, with adjacent ground-glass opacities within the  paramedian right lower lobe. Findings which are nonspecific and possibly related to an infectious or inflammatory process. Attention follow-up imaging suggested. 3. New small left pleural effusion without discrete left-sided pleural nodularity, attention on follow-up imaging suggested. 4.  Aortic Atherosclerosis (ICD10-I70.0). Electronically Signed   By: Dahlia Bailiff MD   On: 07/05/2021 09:28       Assessment and plan  1. Encounter for antineoplastic chemotherapy   2. Primary malignant neoplasm of lung metastatic to other site, unspecified laterality ([place])   3. Bone metastasis ([place])   4. 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  cT4 N3 M1-  KRASG12C (Lumakras in subsequent line can be considered) First-line treatment with carboplatin/Alimta/bevacizumab.[cycle 6 carboplatin hypersensitivity-discontinue] 07/04/21 CT showed overall stable disease. Small new left pleural effusion, attention on follow up.  Labs are reviewed and discussed with patient. Proceed with Alimta and Bev today.  Obtain CT chest abd and pelvis in Oct 2022.    #Bone metastasis.  Status post T2 radiation.  Patient previously declined bisphosphonate and agreed. We have not received dental clearance yet.   #Neoplasm related pain/treatment complication-pain appears to be stable. I wanted to repeat brain MRI with and without contrast for surveillance.- xanax PRN for claustrophobia. Patient prefers to defer MRI   # Right lower extremity  pain, intermittent. Etiology is unclear.  Check bone scan.    Lab MD Alimta, bevacizumab, 3 weeks.  [name], MD, PhD Hematology Oncology [place] at [facility]  09/08/2021

## 2021-09-08 NOTE — Progress Notes (Signed)
Patient here for oncology follow-up appointment,  concerns of leg pain

## 2021-09-08 NOTE — Patient Instructions (Signed)
[facility] ONCOLOGY  Discharge Instructions: Thank you for choosing [facility] to provide your oncology and hematology care.  If you have a lab appointment with the [facility], please go directly to the [facility] and check in at the registration area.  Wear comfortable clothing and clothing appropriate for easy access to any Portacath or PICC line.   We strive to give you quality time with your provider. You may need to reschedule your appointment if you arrive late (15 or more minutes).  Arriving late affects you and other patients whose appointments are after yours.  Also, if you miss three or more appointments without notifying the office, you may be dismissed from the clinic at the provider's discretion.      For prescription refill requests, have your pharmacy contact our office and allow 72 hours for refills to be completed.    Today you received the following chemotherapy and/or immunotherapy agents : avastin / alimta   To help prevent nausea and vomiting after your treatment, we encourage you to take your nausea medication as directed.  BELOW ARE SYMPTOMS THAT SHOULD BE REPORTED IMMEDIATELY: *FEVER GREATER THAN 100.4 F (38 C) OR HIGHER *CHILLS OR SWEATING *NAUSEA AND VOMITING THAT IS NOT CONTROLLED WITH YOUR NAUSEA MEDICATION *UNUSUAL SHORTNESS OF BREATH *UNUSUAL BRUISING OR BLEEDING *URINARY PROBLEMS (pain or burning when urinating, or frequent urination) *BOWEL PROBLEMS (unusual diarrhea, constipation, pain near the anus) TENDERNESS IN MOUTH AND THROAT WITH OR WITHOUT PRESENCE OF ULCERS (sore throat, sores in mouth, or a toothache) UNUSUAL RASH, SWELLING OR PAIN  UNUSUAL VAGINAL DISCHARGE OR ITCHING   Items with * indicate a potential emergency and should be followed up as soon as possible or go to the Emergency Department if any problems should occur.  Please show the CHEMOTHERAPY ALERT CARD or IMMUNOTHERAPY ALERT CARD at  check-in to the Emergency Department and triage nurse.  Should you have questions after your visit or need to cancel or reschedule your appointment, please contact [facility]  [phone] and follow the prompts.  Office hours are 8:00 a.m. to 4:30 p.m. Monday - Friday. Please note that voicemails left after 4:00 p.m. may not be returned until the following business day.  We are closed weekends and major holidays. You have access to a nurse at all times for urgent questions. Please call the main number to the clinic [phone] and follow the prompts.  For any non-urgent questions, you may also contact your provider using MyChart. We now offer e-Visits for anyone 68 and older to request care online for non-urgent symptoms. For details visit [email or web address].   Also download the MyChart app! Go to the app store, search "MyChart", open the app, select [facility], and log in with your MyChart username and password.  Due to Covid, a mask is required upon entering the hospital/clinic. If you do not have a mask, one will be given to you upon arrival. For doctor visits, patients may have 1 support person aged 47 or older with them. For treatment visits, patients cannot have anyone with them due to current Covid guidelines and our immunocompromised population.

## 2021-09-19 ENCOUNTER — Telehealth: Payer: Self-pay

## 2021-09-19 NOTE — Telephone Encounter (Signed)
PA for oxycodone was submitted on 10/3. Received a fax from [employer] stating that they were unable to locate patient pharmacy coverage. [place] and patient's had already picked up medication.

## 2021-09-20 ENCOUNTER — Telehealth: Payer: Self-pay

## 2021-09-20 NOTE — Telephone Encounter (Signed)
Medical questionnaire/disability continuation forms completed and faxed to [facility] for timeframe 03/21/21 - 03/21/2022. Forms sent to med rec to scan.

## 2021-09-21 ENCOUNTER — Encounter
Admission: RE | Admit: 2021-09-21 | Discharge: 2021-09-21 | Disposition: A | Discharge status: Home / Self Care | Payer: BC Managed Care – PPO | Source: Ambulatory Visit | Attending: Oncology | Admitting: Oncology

## 2021-09-21 DIAGNOSIS — C7951 Secondary malignant neoplasm of bone: Secondary | ICD-10-CM | POA: Insufficient documentation

## 2021-09-21 DIAGNOSIS — C3490 Malignant neoplasm of unspecified part of unspecified bronchus or lung: Secondary | ICD-10-CM | POA: Diagnosis not present

## 2021-09-21 MED ORDER — TECHNETIUM TC 99M MEDRONATE IV KIT
20.0000 | PACK | Freq: Once | INTRAVENOUS | Status: AC | PRN
  Administered 2021-09-21: 20.32 via INTRAVENOUS

## 2021-09-27 ENCOUNTER — Other Ambulatory Visit: Payer: Self-pay

## 2021-09-27 ENCOUNTER — Ambulatory Visit
Admission: RE | Admit: 2021-09-27 | Discharge: 2021-09-27 | Disposition: A | Discharge status: Home / Self Care | Payer: BC Managed Care – PPO | Source: Ambulatory Visit | Attending: Oncology | Admitting: Oncology

## 2021-09-27 DIAGNOSIS — C3490 Malignant neoplasm of unspecified part of unspecified bronchus or lung: Secondary | ICD-10-CM | POA: Diagnosis not present

## 2021-09-27 MED ORDER — IOHEXOL 300 MG/ML  SOLN
100.0000 mL | Freq: Once | INTRAMUSCULAR | Status: AC | PRN
  Administered 2021-09-27: 100 mL via INTRAVENOUS

## 2021-09-29 ENCOUNTER — Inpatient Hospital Stay (HOSPITAL_BASED_OUTPATIENT_CLINIC_OR_DEPARTMENT_OTHER): Payer: BC Managed Care – PPO | Admitting: Oncology

## 2021-09-29 ENCOUNTER — Other Ambulatory Visit: Payer: Self-pay

## 2021-09-29 ENCOUNTER — Encounter: Payer: Self-pay | Admitting: Oncology

## 2021-09-29 ENCOUNTER — Inpatient Hospital Stay: Payer: BC Managed Care – PPO | Attending: Oncology

## 2021-09-29 ENCOUNTER — Other Ambulatory Visit (HOSPITAL_COMMUNITY): Payer: Self-pay

## 2021-09-29 ENCOUNTER — Telehealth: Payer: Self-pay | Admitting: Pharmacy Technician

## 2021-09-29 ENCOUNTER — Inpatient Hospital Stay: Payer: BC Managed Care – PPO

## 2021-09-29 VITALS — BP 124/63 | HR 92 | Resp 16

## 2021-09-29 VITALS — BP 131/82 | HR 98 | Temp 97.5°F | Wt 154.5 lb

## 2021-09-29 DIAGNOSIS — C3490 Malignant neoplasm of unspecified part of unspecified bronchus or lung: Secondary | ICD-10-CM

## 2021-09-29 DIAGNOSIS — Z5111 Encounter for antineoplastic chemotherapy: Secondary | ICD-10-CM

## 2021-09-29 DIAGNOSIS — Z800 Family history of malignant neoplasm of digestive organs: Secondary | ICD-10-CM | POA: Insufficient documentation

## 2021-09-29 DIAGNOSIS — G35 Multiple sclerosis: Secondary | ICD-10-CM | POA: Diagnosis not present

## 2021-09-29 DIAGNOSIS — J449 Chronic obstructive pulmonary disease, unspecified: Secondary | ICD-10-CM | POA: Insufficient documentation

## 2021-09-29 DIAGNOSIS — Z803 Family history of malignant neoplasm of breast: Secondary | ICD-10-CM | POA: Diagnosis not present

## 2021-09-29 DIAGNOSIS — E538 Deficiency of other specified B group vitamins: Secondary | ICD-10-CM | POA: Insufficient documentation

## 2021-09-29 DIAGNOSIS — J910 Malignant pleural effusion: Secondary | ICD-10-CM | POA: Diagnosis not present

## 2021-09-29 DIAGNOSIS — Z5112 Encounter for antineoplastic immunotherapy: Secondary | ICD-10-CM | POA: Insufficient documentation

## 2021-09-29 DIAGNOSIS — C7951 Secondary malignant neoplasm of bone: Secondary | ICD-10-CM

## 2021-09-29 DIAGNOSIS — G893 Neoplasm related pain (acute) (chronic): Secondary | ICD-10-CM

## 2021-09-29 DIAGNOSIS — F17210 Nicotine dependence, cigarettes, uncomplicated: Secondary | ICD-10-CM | POA: Diagnosis not present

## 2021-09-29 DIAGNOSIS — K219 Gastro-esophageal reflux disease without esophagitis: Secondary | ICD-10-CM | POA: Diagnosis not present

## 2021-09-29 LAB — CBC WITH DIFFERENTIAL/PLATELET
Abs Immature Granulocytes: 0.03 10*3/uL (ref 0.00–0.07)
Basophils Absolute: 0 10*3/uL (ref 0.0–0.1)
Basophils Relative: 0 %
Eosinophils Absolute: 0 10*3/uL (ref 0.0–0.5)
Eosinophils Relative: 0 %
HCT: 37 % (ref 36.0–46.0)
Hemoglobin: 11.2 g/dL — ABNORMAL LOW (ref 12.0–15.0)
Immature Granulocytes: 0 %
Lymphocytes Relative: 10 %
Lymphs Abs: 0.9 10*3/uL (ref 0.7–4.0)
MCH: 26.3 pg (ref 26.0–34.0)
MCHC: 30.3 g/dL (ref 30.0–36.0)
MCV: 86.9 fL (ref 80.0–100.0)
Monocytes Absolute: 0.7 10*3/uL (ref 0.1–1.0)
Monocytes Relative: 7 %
Neutro Abs: 7.8 10*3/uL — ABNORMAL HIGH (ref 1.7–7.7)
Neutrophils Relative %: 83 %
Platelets: 302 10*3/uL (ref 150–400)
RBC: 4.26 MIL/uL (ref 3.87–5.11)
RDW: 17.8 % — ABNORMAL HIGH (ref 11.5–15.5)
WBC: 9.5 10*3/uL (ref 4.0–10.5)
nRBC: 0 % (ref 0.0–0.2)

## 2021-09-29 LAB — COMPREHENSIVE METABOLIC PANEL
ALT: 21 U/L (ref 0–44)
AST: 27 U/L (ref 15–41)
Albumin: 3 g/dL — ABNORMAL LOW (ref 3.5–5.0)
Alkaline Phosphatase: 87 U/L (ref 38–126)
Anion gap: 8 (ref 5–15)
BUN: 14 mg/dL (ref 8–23)
CO2: 26 mmol/L (ref 22–32)
Calcium: 8.8 mg/dL — ABNORMAL LOW (ref 8.9–10.3)
Chloride: 101 mmol/L (ref 98–111)
Creatinine, Ser: 0.64 mg/dL (ref 0.44–1.00)
GFR, Estimated: >60 mL/min (ref >60)
Glucose, Bld: 107 mg/dL — ABNORMAL HIGH (ref 70–99)
Potassium: 3.5 mmol/L (ref 3.5–5.1)
Sodium: 135 mmol/L (ref 135–145)
Total Bilirubin: 0.2 mg/dL — ABNORMAL LOW (ref 0.3–1.2)
Total Protein: 7.7 g/dL (ref 6.5–8.1)

## 2021-09-29 LAB — PROTEIN, URINE, RANDOM: Total Protein, Urine: 12 mg/dL

## 2021-09-29 MED ORDER — SODIUM CHLORIDE 0.9 % IV SOLN
1000.0000 mg | Freq: Once | INTRAVENOUS | Status: AC
  Administered 2021-09-29: 1000 mg via INTRAVENOUS
  Filled 2021-09-29: qty 32

## 2021-09-29 MED ORDER — SODIUM CHLORIDE 0.9 % IV SOLN
500.0000 mg/m2 | Freq: Once | INTRAVENOUS | Status: AC
  Administered 2021-09-29: 900 mg via INTRAVENOUS
  Filled 2021-09-29: qty 20

## 2021-09-29 MED ORDER — SODIUM CHLORIDE 0.9 % IV SOLN
Freq: Once | INTRAVENOUS | Status: AC
  Filled 2021-09-29: qty 250

## 2021-09-29 MED ORDER — HEPARIN SOD (PORK) LOCK FLUSH 100 UNIT/ML IV SOLN
500.0000 [IU] | Freq: Once | INTRAVENOUS | Status: DC | PRN
  Filled 2021-09-29: qty 5

## 2021-09-29 MED ORDER — PROCHLORPERAZINE MALEATE 10 MG PO TABS
10.0000 mg | ORAL_TABLET | Freq: Once | ORAL | Status: AC
  Administered 2021-09-29: 10 mg via ORAL
  Filled 2021-09-29: qty 1

## 2021-09-29 NOTE — Telephone Encounter (Signed)
Oral Oncology Patient Advocate Encounter   Received notification from [employer] that prior authorization for Lumakras is required.   PA submitted on CoverMyMeds Key [id number] Status is pending   Oral Oncology Clinic will continue to follow.  Brittney Fisher Patient Brittney Fisher Phone [phone] Fax [phone] 09/29/2021 12:01 PM

## 2021-09-29 NOTE — Patient Instructions (Signed)
[facility]  Discharge Instructions: Thank you for choosing [facility] to provide your oncology and hematology care.  If you have a lab appointment with the [facility], please go directly to the [facility] and check in at the registration area.  Wear comfortable clothing and clothing appropriate for easy access to any Portacath or PICC line.   We strive to give you quality time with your provider. You may need to reschedule your appointment if you arrive late (15 or more minutes).  Arriving late affects you and other patients whose appointments are after yours.  Also, if you miss three or more appointments without notifying the office, you may be dismissed from the clinic at the provider's discretion.      For prescription refill requests, have your pharmacy contact our office and allow 72 hours for refills to be completed.    Today you received the following chemotherapy and/or immunotherapy agents Avastin and Alimta        To help prevent nausea and vomiting after your treatment, we encourage you to take your nausea medication as directed.  BELOW ARE SYMPTOMS THAT SHOULD BE REPORTED IMMEDIATELY: *FEVER GREATER THAN 100.4 F (38 C) OR HIGHER *CHILLS OR SWEATING *NAUSEA AND VOMITING THAT IS NOT CONTROLLED WITH YOUR NAUSEA MEDICATION *UNUSUAL SHORTNESS OF BREATH *UNUSUAL BRUISING OR BLEEDING *URINARY PROBLEMS (pain or burning when urinating, or frequent urination) *BOWEL PROBLEMS (unusual diarrhea, constipation, pain near the anus) TENDERNESS IN MOUTH AND THROAT WITH OR WITHOUT PRESENCE OF ULCERS (sore throat, sores in mouth, or a toothache) UNUSUAL RASH, SWELLING OR PAIN  UNUSUAL VAGINAL DISCHARGE OR ITCHING   Items with * indicate a potential emergency and should be followed up as soon as possible or go to the Emergency Department if any problems should occur.  Please show the CHEMOTHERAPY ALERT CARD or IMMUNOTHERAPY ALERT CARD  at check-in to the Emergency Department and triage nurse.  Should you have questions after your visit or need to cancel or reschedule your appointment, please contact [facility]  [phone] and follow the prompts.  Office hours are 8:00 a.m. to 4:30 p.m. Monday - Friday. Please note that voicemails left after 4:00 p.m. may not be returned until the following business day.  We are closed weekends and major holidays. You have access to a nurse at all times for urgent questions. Please call the main number to the clinic [phone] and follow the prompts.  For any non-urgent questions, you may also contact your provider using MyChart. We now offer e-Visits for anyone 64 and older to request care online for non-urgent symptoms. For details visit [email or web address].   Also download the MyChart app! Go to the app store, search "MyChart", open the app, select , and log in with your MyChart username and password.  Due to Covid, a mask is required upon entering the hospital/clinic. If you do not have a mask, one will be given to you upon arrival. For doctor visits, patients may have 1 support person aged 29 or older with them. For treatment visits, patients cannot have anyone with them due to current Covid guidelines and our immunocompromised population.

## 2021-09-29 NOTE — Progress Notes (Signed)
Hematology/Oncology Follow Up Note [facility]  [phone] [phone]  Patient Care Team: Pcp, No as PCP - General Telford Nab, RN as Oncology Nurse Navigator [name], MD as Medical Oncologist (Oncology)   Name of the patient: Brittney Fisher  [id number]  10-30-1961   REASON FOR VISIT  follow-up for lung cancer  PERTINENT ONCOLOGY HISTORY 60 y.o. female with past medical history including GERD, COPD, current everyday smoker presents for follow-up of lung mass and pleural effusion. 10/20/2020, patient was brought to ED via EMS due to generalized weakness, dizziness, chest pain shortness of breath.  She was recently diagnosed with COPD approximately 1 month ago by primary care provider.  Also unintentional weight loss during the last few months. Image work-up showed right-sided pleural effusion.  She underwent right thoracentesis.  With removal of 2.4 L of fluid. 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  Metastatic disease to low cervical/thoracic nodes, lung, right pleural space and bones.  Moderate to large right pleural effusion.  SVC narrowing. 10/21/2020 MRI brain is negative for metastasis.  Mild chronic microvascular ischemic changes in the white matter.  Negative for acute infarct Regarding to the SVC narrowing, patient was seen by vascular surgeon and was recommended no intervention inpatient.  Patient to follow-up outpatient with vascular surgeon for evaluation. Patient also was treated for COPD exacerbation with hypoxia on exertion. Qualifies for home oxygen and hospitalist arrange home health and a nebulizer.  Patient was given a course of prednisone taper and empiric Levaquin. Patient was discharged and present today to follow-up with cytology results and further management plan  10/27/2020 PET scan showed right upper lobe primary bronchogenic  mass with direct invasion into mediastinum. Metastatic disease to the right pleural space, lung, bone, nodes of the chest and less so lower leg/upper abdomen. Hypermetabolic him along the course of the right axillary vein with concurrent subtle hyper attenuation within the axillary vein and SVC.  This continues to the level of SVC narrowing.  Suspicious for SVC occlusion and developing thrombus. Moderate right pleural effusion and small pericardial effusion.  # # left supraclavicular mass biopsy- pathology is positive for adenocarcinoma. positive for CK7, with patchy, weak CK20.  They are  negative for TTF1, NapsinA, GATA3, CDX2, and Pax8.  The findings are  nonspecific; but may be compatible with a poorly differentiated  adenocarcinoma of lung origin, especially given the imaging findings  She also had another repeat thoracentesis and fluid cytology showed malignancy.  #NGS showed AXIN 1 [id number], DIS2 M16f, KRAS G12C, PRDM1 M1 [id number], [id number] MS stable, TPS <1%   11/14/2020- 12/12/2020.  #Bronchial obstruction and SVC occlusion.  s/p palliative radiation #After 2 cycles of chemtherapy, patient had CT scan done due to shortness of breath.  01/05/2021, CT chest angiogram PE protocol showed no evidence of pulmonary embolism.  Stable disease.  Right upper lobe mass about 6.7 x 5.3 cm.  Widespread metastatic disease, lung and bone involvement. Interval development of right-sided hydronephrosis. 03/06/2021 finished T2 palliative radiation 03/22/2021, 6 cycles of carboplatin/Alimta/bevacizumab.  Infusion reaction of carboplatin during cycle 6.  Carboplatin discontinued. 04/07/2021 CT chest abdomen pelvis-partial response Mild decrease in size of right lung mass, mildly decreased mediastinal and hilar adenopathy.  Extensive right-sided pleural metastasis unchanged.  Similar appearance of diffuse bilateral pulmonary nodules.  Multifocal lytic sclerotic bone metastasis.  New superior endplate  deformity of T6  INTERVAL HISTORY Brittney TALIAFERROis a  60 y.o. female who has above history reviewed by me today presents for follow up visit for management of metastatic lung cancer, acute visit for swallowing pain.  Problems and complaints are listed below Patient was accompanied by her daughter. She continues to have right lower extremity pain for which she takes narcotics.  No new complaints.  Review of Systems  Constitutional:  Negative for chills, fatigue, fever and unexpected weight change.  HENT:   Negative for hearing loss and voice change.   Eyes:  Negative for eye problems.  Respiratory:  Negative for chest tightness, cough and shortness of breath.   Cardiovascular:  Negative for chest pain.  Gastrointestinal:  Negative for abdominal distention, abdominal pain and blood in stool.  Endocrine: Negative for hot flashes.  Genitourinary:  Negative for difficulty urinating and frequency.   Musculoskeletal:  Negative for arthralgias.       Right lower extremity pain  Skin:  Negative for itching and rash.  Neurological:  Negative for extremity weakness.  Hematological:  Negative for adenopathy.  Psychiatric/Behavioral:  Negative for confusion.      Allergies  Allergen Reactions   Carboplatin Shortness Of Breath, Itching and Cough     Past Medical History:  Diagnosis Date   COPD (chronic obstructive pulmonary disease) (HCC)    GERD (gastroesophageal reflux disease)    Metastatic lung cancer (metastasis from lung to other site) ([place]) 11/02/2020     Past Surgical History:  Procedure Laterality Date   COLONOSCOPY WITH ESOPHAGOGASTRODUODENOSCOPY (EGD)     COLONOSCOPY WITH PROPOFOL N/A 11/14/2015   Procedure: COLONOSCOPY WITH PROPOFOL;  Surgeon: Hulen Luster, MD;  Location: [facility] ENDOSCOPY;  Service: Gastroenterology;  Laterality: N/A;   ESOPHAGOGASTRODUODENOSCOPY (EGD) WITH PROPOFOL N/A 11/14/2015   Procedure: ESOPHAGOGASTRODUODENOSCOPY (EGD) WITH PROPOFOL;  Surgeon: Hulen Luster, MD;  Location: [facility] ENDOSCOPY;  Service: Gastroenterology;  Laterality: N/A;    Social History   Socioeconomic History   Marital status: Married    Spouse name: Not on file   Number of children: Not on file   Years of education: Not on file   Highest education level: Not on file  Occupational History   Not on file  Tobacco Use   Smoking status: Every Day    Packs/day: 0.20    Types: Cigarettes   Smokeless tobacco: Never  Substance and Sexual Activity   Alcohol use: Never   Drug use: Never   Sexual activity: Not Currently  Other Topics Concern   Not on file  Social History Narrative   Not on file   Social Determinants of Health   Financial Resource Strain: Not on file  Food Insecurity: Not on file  Transportation Needs: Not on file  Physical Activity: Not on file  Stress: Not on file  Social Connections: Not on file  Intimate Partner Violence: Not on file    Family History  Problem Relation Age of Onset   Breast cancer Cousin 72   Diabetes type II Mother    Hypertension Mother    Colon cancer Father    Hypertension Father      Current Outpatient Medications:    acetaminophen (TYLENOL) 500 MG tablet, Take 1,000 mg by mouth every 8 (eight) hours as needed for moderate pain. , Disp: , Rfl:    benzonatate (TESSALON) 100 MG capsule, Take 1 capsule (100 mg total) by mouth 3 (three) times daily as needed for cough., Disp: 60 capsule, Rfl: 1   dexamethasone (DECADRON) 4 MG tablet, Take 1 tab (  4 mg) twice a day the day before Alimta chemo. Take 2 tablets (8 mg) po daily x 3 days starting the day after chemo., Disp: 30 tablet, Rfl: 1   folic acid (FOLVITE) 1 MG tablet, Take 1 tablet (1 mg total) by mouth daily. Start 5-7 days before Alimta chemotherapy. Continue until 21 days after Alimta completed., Disp: 100 tablet, Rfl: 3   guaiFENesin (MUCINEX) 600 MG 12 hr tablet, Take 1 tablet (600 mg total) by mouth 2 (two) times daily., Disp: 14 tablet, Rfl: 0   magic mouthwash  w/lidocaine SOLN, Take 5 mLs by mouth 4 (four) times daily as needed for mouth pain. Sig: Swish/Swallow 5-10 ml four times a day as needed. Dispense 480 ml. 1RF, Disp: 480 mL, Rfl: 1   omeprazole (PRILOSEC) 20 MG capsule, Take 1 capsule (20 mg total) by mouth 2 (two) times daily before a meal., Disp: 60 capsule, Rfl: 1   ondansetron (ZOFRAN) 8 MG tablet, Take 1 tablet (8 mg total) by mouth 2 (two) times daily as needed for refractory nausea / vomiting. Start on day 3 after chemo., Disp: 30 tablet, Rfl: 1   oxyCODONE (OXY IR/ROXICODONE) 5 MG immediate release tablet, Take 1 tablet (5 mg total) by mouth every 6 (six) hours as needed for moderate pain or severe pain., Disp: 120 tablet, Rfl: 0   Polyethylene Glycol 3350 (MIRALAX PO), Take by mouth daily as needed., Disp: , Rfl:    protein supplement shake (PREMIER PROTEIN) LIQD, Take 2 oz by mouth 4 (four) times daily., Disp: , Rfl:    senna-docusate (SENOKOT-S) 8.6-50 MG tablet, Take 2 tablets by mouth daily., Disp: 60 tablet, Rfl: 3   ipratropium-albuterol (DUONEB) 0.5-2.5 (3) MG/3ML SOLN, Take 3 mLs by nebulization 3 (three) times daily. (Patient not taking: Reported on 09/29/2021), Disp: 360 mL, Rfl: 1   prochlorperazine (COMPAZINE) 10 MG tablet, Take 1 tablet (10 mg total) by mouth every 6 (six) hours as needed (Nausea or vomiting). (Patient not taking: No sig reported), Disp: 30 tablet, Rfl: 1   sucralfate (CARAFATE) 1 GM/10ML suspension, Take 10 mLs (1 g total) by mouth in the morning, at noon, and at bedtime. (Patient not taking: Reported on 09/29/2021), Disp: 420 mL, Rfl: 0   tiotropium (SPIRIVA HANDIHALER) 18 MCG inhalation capsule, Place 18 mcg into inhaler and inhale daily. (Patient not taking: No sig reported), Disp: , Rfl:  No current facility-administered medications for this visit.  Facility-Administered Medications Ordered in Other Visits:    heparin lock flush 100 unit/mL, 500 Units, Intracatheter, Once PRN, [name], MD  Physical exam:  ECOG 1 Vitals:   09/29/21 0921  BP: 131/82  Pulse: 98  Temp: (!) 97.5 F (36.4 C)  TempSrc: Tympanic  SpO2: 98%  Weight: 154 lb 8 oz (70.1 kg)   Physical Exam Constitutional:      General: She is not in acute distress.    Comments: Thin built female walks independently  HENT:     Head: Normocephalic and atraumatic.  Eyes:     General: No scleral icterus. Cardiovascular:     Rate and Rhythm: Normal rate and regular rhythm.     Heart sounds: Normal heart sounds.  Pulmonary:     Effort: Pulmonary effort is normal. No respiratory distress.     Breath sounds: No wheezing.     Comments: Slightly decreased breath sound bilaterally.  Abdominal:     General: Bowel sounds are normal. There is no distension.     Palpations: Abdomen is  soft.  Musculoskeletal:        General: No deformity. Normal range of motion.     Cervical back: Normal range of motion and neck supple.  Skin:    General: Skin is warm and dry.     Findings: No erythema or rash.  Neurological:     Mental Status: She is alert and oriented to person, place, and time. Mental status is at baseline.     Cranial Nerves: No cranial nerve deficit.     Coordination: Coordination normal.  Psychiatric:        Mood and Affect: Mood normal.    CMP Latest Ref Rng & Units 09/29/2021  Glucose 70 - 99 mg/dL 107(H)  BUN 8 - 23 mg/dL 14  Creatinine 0.44 - 1.00 mg/dL 0.64  Sodium 135 - 145 mmol/L 135  Potassium 3.5 - 5.1 mmol/L 3.5  Chloride 98 - 111 mmol/L 101  CO2 22 - 32 mmol/L 26  Calcium 8.9 - 10.3 mg/dL 8.8(L)  Total Protein 6.5 - 8.1 g/dL 7.7  Total Bilirubin 0.3 - 1.2 mg/dL 0.2(L)  Alkaline Phos 38 - 126 U/L 87  AST 15 - 41 U/L 27  ALT 0 - 44 U/L 21   CBC Latest Ref Rng & Units 09/29/2021  WBC 4.0 - 10.5 K/uL 9.5  Hemoglobin 12.0 - 15.0 g/dL 11.2(L)  Hematocrit 36.0 - 46.0 % 37.0  Platelets 150 - 400 K/uL 302    RADIOGRAPHIC STUDIES: I have personally reviewed the radiological images as listed and agreed with  the findings in the report. NM Bone Scan Whole Body  Result Date: 09/24/2021 CLINICAL DATA:  Lung cancer bone Mets EXAM: NUCLEAR MEDICINE WHOLE BODY BONE SCAN TECHNIQUE: Whole body anterior and posterior images were obtained approximately 3 hours after intravenous injection of radiopharmaceutical. RADIOPHARMACEUTICALS:  20.32 mCi Technetium-44mMDP IV COMPARISON:  CT 07/04/2021, PET CT 10/27/2020 FINDINGS: Multifocal abnormal uptake visualized within the mid to distal shaft of right femur, left femoral neck and trochanter, left sacroiliac region, and left ischium. Focus of activity at the proximal tibia on the left. Multifocal heterogeneous bilateral rib activity. Focal activity at the sternum. Mild asymmetrical activity at the right AThe Center For Surgeryjoint and shoulder potentially degenerative. Excreted activity into the kidneys and bladder with possible mild right hydronephrosis. IMPRESSION: 1. Multifocal abnormal uptake as described above consistent with metastatic disease 2. Excreted activity into right renal collecting system with possible mild hydronephrosis on the right. Electronically Signed   By: KDonavan FoilM.D.   On: 09/24/2021 20:03   CT CHEST ABDOMEN PELVIS W CONTRAST  Result Date: 09/28/2021 CLINICAL DATA:  Lung cancer.  Restaging. EXAM: CT CHEST, ABDOMEN, AND PELVIS WITH CONTRAST TECHNIQUE: Multidetector CT imaging of the chest, abdomen and pelvis was performed following the standard protocol during bolus administration of intravenous contrast. CONTRAST:  1081mOMNIPAQUE IOHEXOL 300 MG/ML  SOLN COMPARISON:  07/04/2021 FINDINGS: CT CHEST FINDINGS Cardiovascular: The heart size is normal. No substantial pericardial effusion. Mediastinum/Nodes: Amorphous nodal/metastatic tissue in the mediastinum is similar to prior. Precarinal disease measured previously at 18 mm is 17 mm today on 27/2. AP window lymph node measured previously at 13 mm is 11 mm today on 26/2. Lungs/Pleura: Masslike consolidative opacity in  the infrahilar left lower lobe appears progressive with 6.7 x 3.8 cm lesion on 47/2 increased from 4.6 x 2.6 cm (remeasured) previously the extensive irregular right-sided pleural disease is similar although reproducible measurement is difficult due to irregular shape. Plaque-like disease along the right heart border on  image 37/2 measures 6.5 x 1.8 cm today which compares to 6.6 x 2.0 cm when remeasured in a similar fashion on the prior study. 13 mm left lower lobe nodule on 48/2 was 8 mm previously (remeasured). And while 13 mm right lower lobe nodule on 48/2 was 8 mm previously (remeasured). 13 mm right lower lobe nodule on 106/3 was 12 mm previously. Interval progression of small left pleural effusion. Musculoskeletal: Lytic T2 lesion is similar to prior. CT ABDOMEN PELVIS FINDINGS Hepatobiliary: No suspicious focal abnormality within the liver parenchyma. There is no evidence for gallstones, gallbladder wall thickening, or pericholecystic fluid. No intrahepatic or extrahepatic biliary dilation. Pancreas: No focal mass lesion. No dilatation of the main duct. No intraparenchymal cyst. No peripancreatic edema. Spleen: No splenomegaly. No focal mass lesion. Adrenals/Urinary Tract: No adrenal nodule or mass. Bilateral renal cysts again noted. No evidence for hydroureter. Bladder is decompressed. Stomach/Bowel: Stomach is distended with contrast material. Duodenum is normally positioned as is the ligament of Treitz. No small bowel wall thickening. No small bowel dilatation. The terminal ileum is normal. The appendix is normal. No gross colonic mass. No colonic wall thickening. Vascular/Lymphatic: There is advanced atherosclerotic calcification of the abdominal aorta without aneurysm. There is no gastrohepatic or hepatoduodenal ligament lymphadenopathy. No retroperitoneal or mesenteric lymphadenopathy. No pelvic sidewall lymphadenopathy. Reproductive: The uterus is unremarkable.  There is no adnexal mass. Other: No  intraperitoneal free fluid. Musculoskeletal: Scattered sclerotic bone lesions again noted, similar to prior. Lucent lesion posterior left iliac bone is stable. IMPRESSION: 1. No generalized trend towards improving or progressive disease. Some of the index mediastinal disease measures stable to minimally smaller today many of the pulmonary nodules appear similar, some have progressed. The most concerning progression is in the infrahilar left lower lobe with a concerning clearly progressive masslike consolidative opacity. 2. Mild progression of small left pleural effusion. 3. Similar appearance of extensive irregular right-sided pleural disease. 4. Similar appearance of bony metastatic involvement. 5. Aortic Atherosclerosis (ICD10-I70.0). Electronically Signed   By: Misty Stanley M.D.   On: 09/28/2021 20:41   CT CHEST ABDOMEN PELVIS W CONTRAST  Result Date: 07/05/2021 CLINICAL DATA:  Lung cancer, restaging. EXAM: CT CHEST, ABDOMEN, AND PELVIS WITH CONTRAST TECHNIQUE: Multidetector CT imaging of the chest, abdomen and pelvis was performed following the standard protocol during bolus administration of intravenous contrast. CONTRAST:  67m OMNIPAQUE IOHEXOL 350 MG/ML SOLN COMPARISON:  Multiple priors including most recent CT February 07, 2021 FINDINGS: CT CHEST FINDINGS Cardiovascular: Aortic and branch vessel atherosclerosis without aneurysmal dilation. No central pulmonary embolus. Normal size heart. No significant pericardial effusion/thickening. Mediastinum/Nodes: Similar extensive thoracic adenopathy. Index lesions are as follows: Right paratracheal lymph node measures 2.3 cm, image 20/2, unchanged. Low right paratracheal lymph node measures 1.8 cm on image 26/2, unchanged. Left pre vascular lymph node measures 1.3 cm on image 26/2, unchanged. Subcarinal node measures 1.8 cm on image 28/2, previously 1.9 cm. Right hilar node measures 1.4 cm on image 32/2, unchanged. Left hilar node measures 1.4 cm on image 38/2,  unchanged. Lungs/Pleura: Similar appearance of the nodular soft tissue rind encasing lung pleural metastatic disease. Similar right upper lobe atelectasis. As on multiple prior examinations the margin of the right lung are difficult to delineate, subjectively the lesion appears stable from prior and by best measurable approximation on this study the lesion measures 8.3 x 3.8 cm on image 20/2 previously 8.2 x 4.8 cm. No significant interval change in size and number of the numerous scattered bilateral metastatic lung  nodules. Increase in the consolidated left lower lobe favored represent postobstructive atelectasis. Within the paramedian right lower lobe there is a development interlobular septal thickening, without nodularity, with adjacent ground-glass opacities. New small left pleural effusion, without discrete nodule. Musculoskeletal: Overall similar appearance the mixed lytic and osseous lesions visualized throughout the axial and proximal appendicular skeleton. T6 vertebral endplate compression deformity is not significantly changed. CT ABDOMEN PELVIS FINDINGS Hepatobiliary: No focal liver abnormality is seen. No gallstones, gallbladder wall thickening, or biliary dilatation. Pancreas: Unremarkable. No pancreatic ductal dilatation or surrounding inflammatory changes. Spleen: Normal in size without focal abnormality. Adrenals/Urinary Tract: Bilateral adrenal glands are unremarkable. Right renal sinus cyst. 2 cm left upper pole renal cyst. No hydronephrosis. Urinary bladder is decompressed limiting evaluation. Stomach/Bowel: Stomach is within normal limits. Appendix appears normal. No evidence of bowel wall thickening, distention, or inflammatory changes. Vascular/Lymphatic: Aortic atherosclerosis without aneurysmal dilation. Pathologically enlarged abdominopelvic. Reproductive: Uterus and bilateral adnexa are unremarkable. Other: No abdominopelvic ascites. Musculoskeletal: No significant interval change in the  multifocal mixed lytic and sclerotic osseous metastatic lesions throughout the axial and proximal appendicular skeleton IMPRESSION: 1. Overall there is no significant interval change in the ill-defined right upper lobe lesion, right pleural based metastases, bilateral metastatic lung nodules, thoracic adenopathy or the multifocal osseous metastatic lesions. No definite evidence new metastatic disease in the chest, abdomen or pelvis. 2. Interval development of interlobular septal thickening, without nodularity, with adjacent ground-glass opacities within the paramedian right lower lobe. Findings which are nonspecific and possibly related to an infectious or inflammatory process. Attention follow-up imaging suggested. 3. New small left pleural effusion without discrete left-sided pleural nodularity, attention on follow-up imaging suggested. 4.  Aortic Atherosclerosis (ICD10-I70.0). Electronically Signed   By: Dahlia Bailiff MD   On: 07/05/2021 09:28       Assessment and plan  1. Primary malignant neoplasm of lung metastatic to other site, unspecified laterality ([place])   2. Encounter for antineoplastic chemotherapy   3. Bone metastasis ([place])   4. 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  cT4 N3 M1-  KRASG12C (Lumakras in subsequent line can be considered) First-line treatment with carboplatin/Alimta/bevacizumab.[cycle 6 carboplatin hypersensitivity-discontinue] 09/21/2021, bone scan showed multifocal abnormal uptake including mid to distal shaft of right femur, left femoral neck and trochanter, left sacroiliac region, left ischium.  Focal activity at the proximal tibia on the left.  Multi focal heterogeneous bilateral rib activity.  Focal  activity at the sternum. 09/27/2021, CT chest abdomen pelvis with contrast showed progression of infrahilar left lower lobe with a concerning clearly progressive masslike consolidation opacity.  Mild progression of small left pleural effusion.  Similar experience of extensive irregular right-sided pleural disease.  Similar experience of bony metastasis.   Imaging results were independently reviewed and discussed with patient.  This is consistent with disease progression. For the progression of bone metastasis, recommend patient to see Dr. Baruch Gouty for palliative radiation.  I urged patient to reconsider Zometa and get dental clearance.  Infrahilar left lower lobe progression. Proceed with Alimta and bevacizumab today. Plan to switch to [facility] as a subsequent line of treatment. Patient has high TMB, TPS less than 1%.  Immunotherapy in combination with chemotherapy will be other options in the future if lumakras is  not effective.  Rationale potential side effects of the medication were reviewed with patient and daughter.  They agree with proceeding with the treatment.  We will look into insurance coverage.  Lab MD 3 weeks, evaluation prior to Clois Dupes, MD, PhD Hematology Oncology [place] at [facility]  09/29/2021

## 2021-09-29 NOTE — Telephone Encounter (Signed)
Oral Oncology Patient Advocate Encounter  Prior Authorization for Brittney Fisher has been approved.    PA# [id number] Effective dates: 09/29/21 through 09/29/22  Patients co-pay is $0.00.  Oral Oncology Clinic will continue to follow.   [place] [facility] [place] Phone [phone] Fax [phone] 09/29/2021 12:04 PM

## 2021-10-02 ENCOUNTER — Telehealth: Payer: Self-pay | Admitting: Pharmacist

## 2021-10-02 DIAGNOSIS — C3490 Malignant neoplasm of unspecified part of unspecified bronchus or lung: Secondary | ICD-10-CM

## 2021-10-02 IMAGING — CR DG CHEST 2V
1 series · 2 of 2 positions shown · non-contrast
Comparison: 10/31/2020, CT 10/18/2020

CLINICAL DATA: Cough for 2 weeks history of metastatic lung cancer

EXAM:
CHEST - 2 VIEW

[Series 1: dg chest 2 view · 0.14mm/px · 2 of 2 slices shown]
[im 1/2]
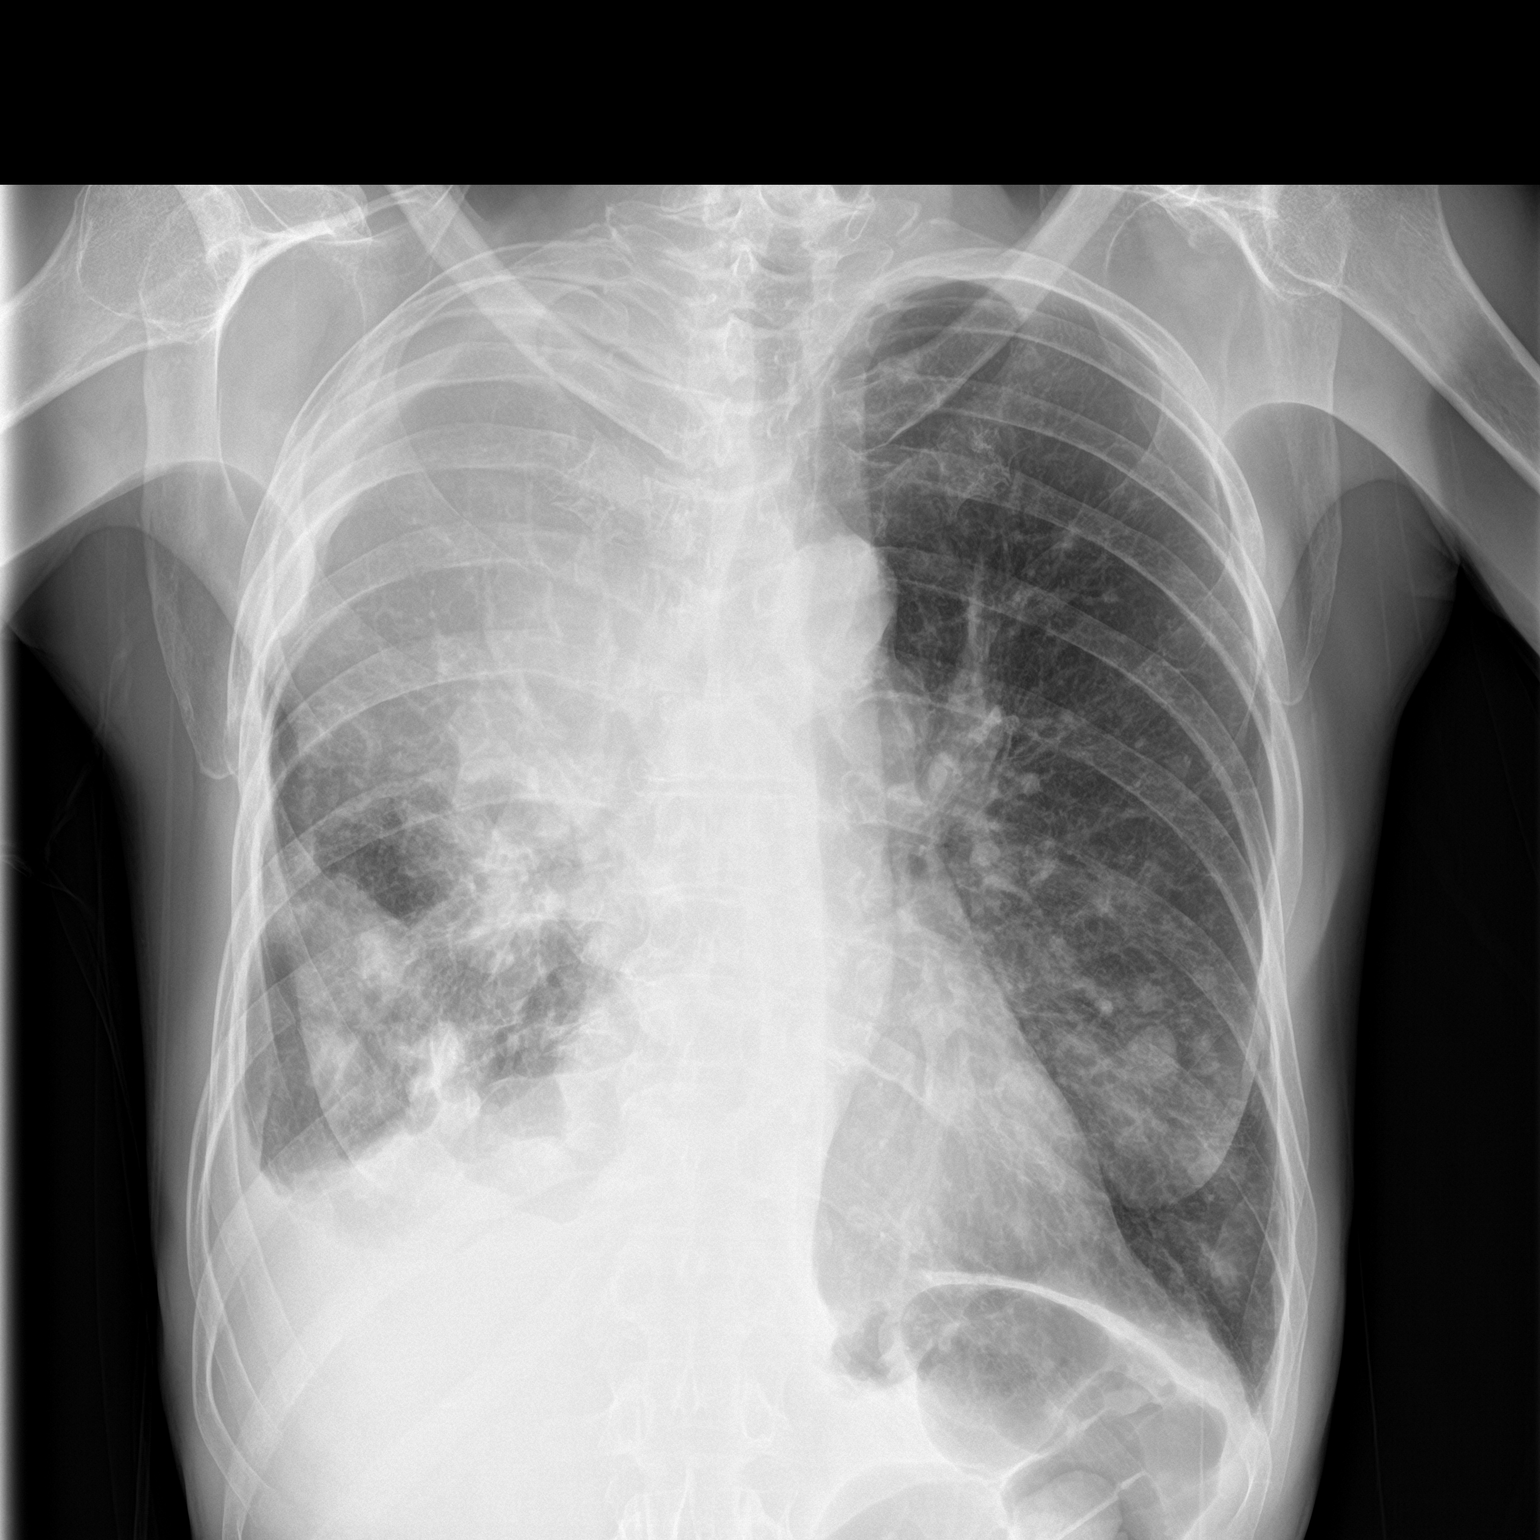
[im 2/2]
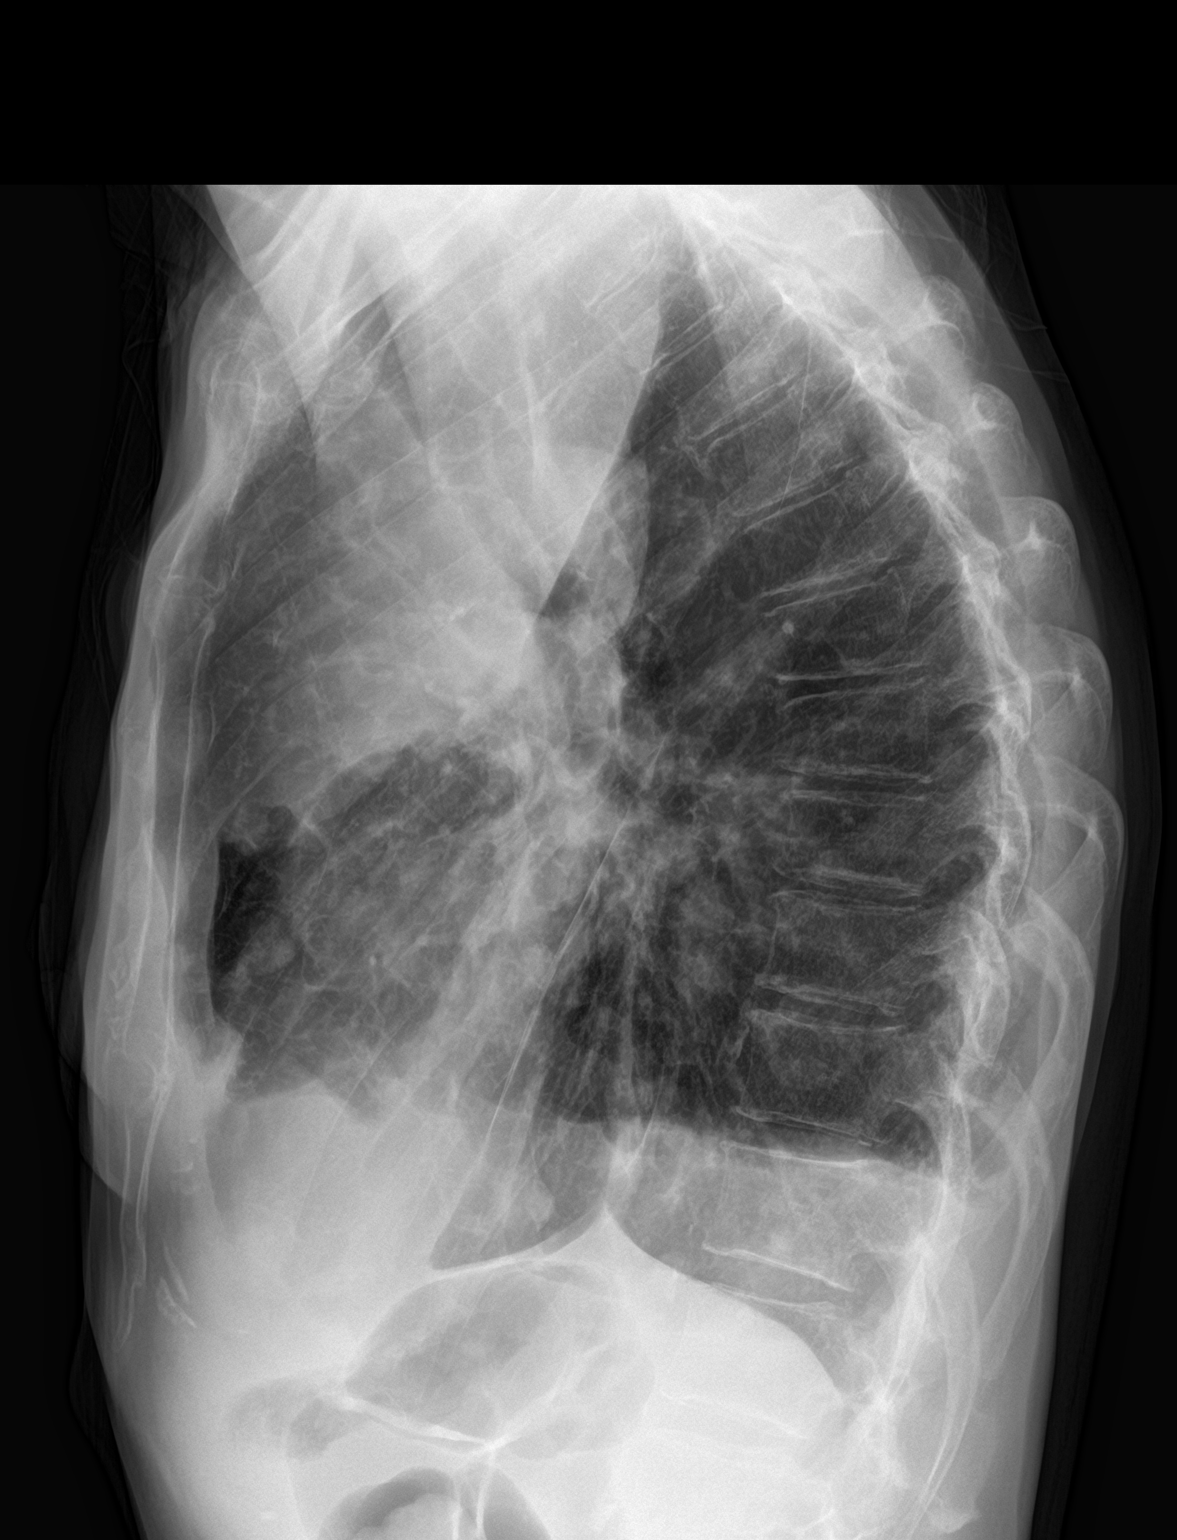

[2 of 2 positions shown; findings below may reference images not displayed]

FINDINGS: Grossly stable appearance of the chest as compared with 10/31/2020.
residual right pleural effusion/pleural disease with right hilar
mass and upper lobe collapse or consolidation. Numerous metastatic
pulmonary nodules as demonstrated on prior CT. Possible increasing
airspace disease at the right lower lung. Stable cardiomediastinal
silhouette.
IMPRESSION: Similar appearance of the chest compared with 10/31/2020 with
residual right pleural effusion/pleural disease, right hilar mass
and upper lobe collapse or consolidation. Numerous metastatic
pulmonary nodules. Possible increasing airspace disease at the right
lower lung.

## 2021-10-02 NOTE — Telephone Encounter (Signed)
Oral Oncology Pharmacy Student Encounter  Received new prescription for Lumakras (Sotorasib) for the treatment of KRASG12C-mutated, Stage IV (cT4, N3, M1), NSCLC following progression of disease despite GDMT with Carboplatin/Pemetrexed/Bevacizumab. Planned duration for Lumakras is until disease progression, unacceptable toxicity, or withdrawn consent.  Labs from 09/29/21 assessed, ok to proceed with treatment.   Current medication list in Epic reviewed, 2 DDIs with Omeprazole and Oxycodone identified: PPIs: Inhibitors of the Proton Pump (PPIs and PCABs) may decrease the serum concentration of Lumakras (Sotorasib). Recommend discontinuing Omeprazole. If needed, calcium carbonate can be used as an alternative. Recommend taking 4 hours before or 10 hours after Lumakras (Sotorasib) administration.   Oxycodone: CYP3A4 Inducers (Moderate) may decrease the serum concentration of OxyCODONE. Recommend monitoring for pain control. No baseline adjustments needed. Will reassess at next visit.  Evaluated chart and 0 patient barriers to medication adherence identified. Prior authorization for Lumakras was approved 09/29/21.  Oral Oncology Clinic will continue to follow for insurance authorization, copayment issues, initial counseling and start date.   Patient agreed to treatment on 09/29/21 per MD documentation.  [place] Student [facility] [phone]  10/02/2021 9:18 AM

## 2021-10-03 ENCOUNTER — Other Ambulatory Visit (HOSPITAL_COMMUNITY): Payer: Self-pay

## 2021-10-03 ENCOUNTER — Telehealth: Payer: Self-pay

## 2021-10-03 MED ORDER — LUMAKRAS 120 MG PO TABS
960.0000 mg | ORAL_TABLET | Freq: Every day | ORAL | 0 refills | Status: DC
  Filled 2021-10-03 – 2021-10-11 (×2): qty 240, 30d supply, fill #0

## 2021-10-03 NOTE — Telephone Encounter (Signed)
Medical Leave forms faxed to [facility] accomodation service center.

## 2021-10-11 ENCOUNTER — Other Ambulatory Visit (HOSPITAL_COMMUNITY): Payer: Self-pay

## 2021-10-16 ENCOUNTER — Other Ambulatory Visit (HOSPITAL_COMMUNITY): Payer: Self-pay

## 2021-10-19 NOTE — Telephone Encounter (Signed)
Received additional forms from [facility]. Filled out and faxed.

## 2021-10-20 ENCOUNTER — Other Ambulatory Visit (HOSPITAL_COMMUNITY): Payer: Self-pay

## 2021-10-20 ENCOUNTER — Inpatient Hospital Stay (HOSPITAL_BASED_OUTPATIENT_CLINIC_OR_DEPARTMENT_OTHER): Payer: BC Managed Care – PPO | Admitting: Oncology

## 2021-10-20 ENCOUNTER — Other Ambulatory Visit: Payer: Self-pay

## 2021-10-20 ENCOUNTER — Inpatient Hospital Stay: Payer: BC Managed Care – PPO

## 2021-10-20 ENCOUNTER — Inpatient Hospital Stay: Payer: BC Managed Care – PPO | Attending: Oncology

## 2021-10-20 ENCOUNTER — Encounter: Payer: Self-pay | Admitting: Oncology

## 2021-10-20 ENCOUNTER — Inpatient Hospital Stay: Payer: BC Managed Care – PPO | Admitting: Pharmacist

## 2021-10-20 VITALS — BP 121/74 | HR 106 | Temp 97.7°F | Wt 147.0 lb

## 2021-10-20 DIAGNOSIS — G893 Neoplasm related pain (acute) (chronic): Secondary | ICD-10-CM | POA: Insufficient documentation

## 2021-10-20 DIAGNOSIS — M79604 Pain in right leg: Secondary | ICD-10-CM | POA: Insufficient documentation

## 2021-10-20 DIAGNOSIS — C7951 Secondary malignant neoplasm of bone: Secondary | ICD-10-CM | POA: Diagnosis not present

## 2021-10-20 DIAGNOSIS — K219 Gastro-esophageal reflux disease without esophagitis: Secondary | ICD-10-CM

## 2021-10-20 DIAGNOSIS — Z79899 Other long term (current) drug therapy: Secondary | ICD-10-CM | POA: Diagnosis not present

## 2021-10-20 DIAGNOSIS — C3490 Malignant neoplasm of unspecified part of unspecified bronchus or lung: Secondary | ICD-10-CM

## 2021-10-20 DIAGNOSIS — Z5111 Encounter for antineoplastic chemotherapy: Secondary | ICD-10-CM

## 2021-10-20 DIAGNOSIS — Z87891 Personal history of nicotine dependence: Secondary | ICD-10-CM | POA: Insufficient documentation

## 2021-10-20 DIAGNOSIS — Z23 Encounter for immunization: Secondary | ICD-10-CM | POA: Insufficient documentation

## 2021-10-20 DIAGNOSIS — J910 Malignant pleural effusion: Secondary | ICD-10-CM | POA: Diagnosis not present

## 2021-10-20 DIAGNOSIS — C782 Secondary malignant neoplasm of pleura: Secondary | ICD-10-CM | POA: Diagnosis present

## 2021-10-20 DIAGNOSIS — R634 Abnormal weight loss: Secondary | ICD-10-CM

## 2021-10-20 DIAGNOSIS — C3411 Malignant neoplasm of upper lobe, right bronchus or lung: Secondary | ICD-10-CM | POA: Diagnosis not present

## 2021-10-20 LAB — CBC WITH DIFFERENTIAL/PLATELET
Abs Immature Granulocytes: 0.04 10*3/uL (ref 0.00–0.07)
Basophils Absolute: 0 10*3/uL (ref 0.0–0.1)
Basophils Relative: 0 %
Eosinophils Absolute: 0.1 10*3/uL (ref 0.0–0.5)
Eosinophils Relative: 2 %
HCT: 35.7 % — ABNORMAL LOW (ref 36.0–46.0)
Hemoglobin: 11.4 g/dL — ABNORMAL LOW (ref 12.0–15.0)
Immature Granulocytes: 1 %
Lymphocytes Relative: 12 %
Lymphs Abs: 1 10*3/uL (ref 0.7–4.0)
MCH: 26.4 pg (ref 26.0–34.0)
MCHC: 31.9 g/dL (ref 30.0–36.0)
MCV: 82.6 fL (ref 80.0–100.0)
Monocytes Absolute: 0.7 10*3/uL (ref 0.1–1.0)
Monocytes Relative: 9 %
Neutro Abs: 6.6 10*3/uL (ref 1.7–7.7)
Neutrophils Relative %: 76 %
Platelets: 336 10*3/uL (ref 150–400)
RBC: 4.32 MIL/uL (ref 3.87–5.11)
RDW: 17.3 % — ABNORMAL HIGH (ref 11.5–15.5)
WBC: 8.6 10*3/uL (ref 4.0–10.5)
nRBC: 0 % (ref 0.0–0.2)

## 2021-10-20 LAB — COMPREHENSIVE METABOLIC PANEL
ALT: 18 U/L (ref 0–44)
AST: 27 U/L (ref 15–41)
Albumin: 3 g/dL — ABNORMAL LOW (ref 3.5–5.0)
Alkaline Phosphatase: 75 U/L (ref 38–126)
Anion gap: 11 (ref 5–15)
BUN: 19 mg/dL (ref 8–23)
CO2: 26 mmol/L (ref 22–32)
Calcium: 8.9 mg/dL (ref 8.9–10.3)
Chloride: 102 mmol/L (ref 98–111)
Creatinine, Ser: 0.86 mg/dL (ref 0.44–1.00)
GFR, Estimated: >60 mL/min (ref >60)
Glucose, Bld: 98 mg/dL (ref 70–99)
Potassium: 3.5 mmol/L (ref 3.5–5.1)
Sodium: 139 mmol/L (ref 135–145)
Total Bilirubin: 0.4 mg/dL (ref 0.3–1.2)
Total Protein: 7.8 g/dL (ref 6.5–8.1)

## 2021-10-20 MED ORDER — BENZONATATE 100 MG PO CAPS: 100.0000 mg | ORAL_CAPSULE | Freq: Three times a day (TID) | ORAL | 1 refills | Status: DC | PRN

## 2021-10-20 MED ORDER — INFLUENZA VAC SPLIT QUAD 0.5 ML IM SUSY
0.5000 mL | PREFILLED_SYRINGE | Freq: Once | INTRAMUSCULAR | Status: AC
  Administered 2021-10-20: 0.5 mL via INTRAMUSCULAR
  Filled 2021-10-20: qty 0.5

## 2021-10-20 NOTE — Progress Notes (Signed)
[place]  [phone] [phone]  Patient Care Team: Pcp, No as PCP - General Telford Nab, RN as Oncology Nurse Navigator [name], MD as Medical Oncologist (Oncology)   Name of the patient: Brittney Fisher  [id number]  05-Jul-1961   Date of visit: 10/20/21  HPI: Patient is a 60 y.o. female with progressive NSCLC, positive for KRAS G12C mutation. Previously treated with carboplatin/pemetrexed/bevacizumab, she will now transition to treatment with Lumakras (sotorasib). Planned start today 10/20/2021.   Reason for Consult: Sotorasib oral chemotherapy education.   PAST MEDICAL HISTORY: Past Medical History:  Diagnosis Date   COPD (chronic obstructive pulmonary disease) (HCC)    GERD (gastroesophageal reflux disease)    Metastatic lung cancer (metastasis from lung to other site) ([place]) 11/02/2020    HEMATOLOGY/ONCOLOGY HISTORY:  Oncology History  Metastatic lung cancer (metastasis from lung to other site) [facility])  11/02/2020 Initial Diagnosis   Metastatic lung cancer (metastasis from lung to other site) [facility])   11/18/2020 -  Chemotherapy   Patient is on Treatment Plan : LUNG PEMEtrexed (Alimta) + CARBOplatin + Bevacizumab q21d x 1 cycle      Primary malignant neoplasm of lung metastatic to other site [facility])  11/04/2020 Initial Diagnosis   Primary malignant neoplasm of lung metastatic to other site [facility])   11/04/2020 Cancer Staging   Staging form: Lung, AJCC 8th Edition - Clinical stage from 11/04/2020: Stage IV (cT4, cN3, cM1) - Signed by [name], MD on 11/04/2020      ALLERGIES:  is allergic to carboplatin.  MEDICATIONS:  Current Outpatient Medications  Medication Sig Dispense Refill   acetaminophen (TYLENOL) 500 MG tablet Take 1,000 mg by mouth every 8 (eight) hours as needed for moderate pain.      benzonatate (TESSALON) 100 MG capsule Take 1 capsule (100 mg total) by mouth 3 (three) times daily as  needed for cough. 60 capsule 1   dexamethasone (DECADRON) 4 MG tablet Take 1 tab (4 mg) twice a day the day before Alimta chemo. Take 2 tablets (8 mg) po daily x 3 days starting the day after chemo. 30 tablet 1   folic acid (FOLVITE) 1 MG tablet Take 1 tablet (1 mg total) by mouth daily. Start 5-7 days before Alimta chemotherapy. Continue until 21 days after Alimta completed. 100 tablet 3   guaiFENesin (MUCINEX) 600 MG 12 hr tablet Take 1 tablet (600 mg total) by mouth 2 (two) times daily. 14 tablet 0   ipratropium-albuterol (DUONEB) 0.5-2.5 (3) MG/3ML SOLN Take 3 mLs by nebulization 3 (three) times daily. (Patient not taking: No sig reported) 360 mL 1   magic mouthwash w/lidocaine SOLN Take 5 mLs by mouth 4 (four) times daily as needed for mouth pain. Sig: Swish/Swallow 5-10 ml four times a day as needed. Dispense 480 ml. 1RF 480 mL 1   omeprazole (PRILOSEC) 20 MG capsule Take 1 capsule (20 mg total) by mouth 2 (two) times daily before a meal. 60 capsule 1   ondansetron (ZOFRAN) 8 MG tablet Take 1 tablet (8 mg total) by mouth 2 (two) times daily as needed for refractory nausea / vomiting. Start on day 3 after chemo. 30 tablet 1   oxyCODONE (OXY IR/ROXICODONE) 5 MG immediate release tablet Take 1 tablet (5 mg total) by mouth every 6 (six) hours as needed for moderate pain or severe pain. 120 tablet 0   Polyethylene Glycol 3350 (MIRALAX PO) Take by mouth daily as needed.  prochlorperazine (COMPAZINE) 10 MG tablet Take 1 tablet (10 mg total) by mouth every 6 (six) hours as needed (Nausea or vomiting). (Patient not taking: No sig reported) 30 tablet 1   protein supplement shake (PREMIER PROTEIN) LIQD Take 2 oz by mouth 4 (four) times daily.     senna-docusate (SENOKOT-S) 8.6-50 MG tablet Take 2 tablets by mouth daily. 60 tablet 3   sotorasib (LUMAKRAS) 120 MG TABS Take 960 mg by mouth daily. 240 tablet 0   sucralfate (CARAFATE) 1 GM/10ML suspension Take 10 mLs (1 g total) by mouth in the morning, at  noon, and at bedtime. (Patient not taking: No sig reported) 420 mL 0   tiotropium (SPIRIVA HANDIHALER) 18 MCG inhalation capsule Place 18 mcg into inhaler and inhale daily. (Patient not taking: No sig reported)     No current facility-administered medications for this visit.    VITAL SIGNS: There were no vitals taken for this visit. There were no vitals filed for this visit.  Estimated body mass index is 20.5 kg/m as calculated from the following:   Height as of 06/16/21: '5\' 11"'  (1.803 m).   Weight as of an earlier encounter on 10/20/21: 66.7 kg (147 lb).  LABS: CBC:    Component Value Date/Time   WBC 8.6 10/20/2021 0950   HGB 11.4 (L) 10/20/2021 0950   HCT 35.7 (L) 10/20/2021 0950   PLT 336 10/20/2021 0950   MCV 82.6 10/20/2021 0950   NEUTROABS 6.6 10/20/2021 0950   LYMPHSABS 1.0 10/20/2021 0950   MONOABS 0.7 10/20/2021 0950   EOSABS 0.1 10/20/2021 0950   BASOSABS 0.0 10/20/2021 0950   Comprehensive Metabolic Panel:    Component Value Date/Time   NA 139 10/20/2021 0950   K 3.5 10/20/2021 0950   CL 102 10/20/2021 0950   CO2 26 10/20/2021 0950   BUN 19 10/20/2021 0950   CREATININE 0.86 10/20/2021 0950   GLUCOSE 98 10/20/2021 0950   CALCIUM 8.9 10/20/2021 0950   AST 27 10/20/2021 0950   ALT 18 10/20/2021 0950   ALKPHOS 75 10/20/2021 0950   BILITOT 0.4 10/20/2021 0950   PROT 7.8 10/20/2021 0950   ALBUMIN 3.0 (L) 10/20/2021 0950     Present during today's visit: patient and her niece (POA) Maxwell  Start plan: Ms. Scarfo will start taking her sotorasib today 10/20/21   Patient Education I spoke with patient for overview of new oral chemotherapy medication: sotorasib   Administration: Counseled patient on administration, dosing, side effects, monitoring, drug-food interactions, safe handling, storage, and disposal. Patient will take 8 tablets (948m) by mouth daily.  Side Effects: Side effects include but not limited to: decreased wbc/hgb, changes in liver function,  diarrhea. Diarrhea: Patient normally has to manage constipation. She currently takes Miralax and senna-docusate. She will go ahead and stop her Miralax for now. If diarrhea occurs she will stop the senna-docusate and started using loperamide. She knows to call the office if she is having 4 or more loose stool/day. If diarrhea does not occur, she can resume her Miralax.  Drug-drug Interactions (DDI): PPIs: reviewed omeprazole DDI during the visit. She reports only taking the omeprazole prn and 3 times per weeks depending on her diet for th day. She will stop the omeprazole. She knows to also avoid H2-antagonist such was Pepcid and Zantac. She can use antacids, such as Tums, as needed if separated from her sotorasib. Take sotorasib 4 hours before or 10 hours after taking the antacid.  Other: Patient mentioned food not tasting  the same since starting chemotherapy. Hopefully this will improve with the change in her treatment regimen. Dr. [name] suggested the patient try [facility], [place], the is no issue with trying this while on sotorasib. [name], called and informed post visit.  Adherence: After discussion with patient no patient barriers to medication adherence identified. She opened the bottle during her visit today and she thinks she will not have trouble swallowing them. Reviewed with patient importance of keeping a medication schedule and plan for any missed doses.  Ms. Eckrich and [name] voiced understanding and appreciation. All questions answered. Medication handout and NCCN patient guidelines provided.  Provided patient with Oral [facility] phone number. Patient knows to call the office with questions or concerns. Oral Chemotherapy Navigation Clinic will continue to follow.  Patient expressed understanding and was in agreement with this plan. She also understands that She can call clinic at any time with any questions, concerns, or complaints.   Medication Access Issues: No  issues patient fills at [facility]  Follow-up plan: RTC in 1 week  Thank you for allowing me to participate in the care of this patient.   Time Total: 30 mins  Visit consisted of counseling and education on dealing with issues of symptom management in the setting of serious and potentially life-threatening illness.Greater than 50%  of this time was spent counseling and coordinating care related to the above assessment and plan.  Signed by: Darl Pikes, PharmD, BCPS, Salley Slaughter, CPP Hematology/Oncology Clinical Pharmacist Practitioner [facility] [phone]  10/20/2021 11:45 AM

## 2021-10-20 NOTE — Progress Notes (Signed)
Hematology/Oncology Follow Up Note [phone] [phone]  Patient Care Team: Pcp, No as PCP - General Telford Nab, RN as Oncology Nurse Navigator [name], MD as Medical Oncologist (Oncology)   Name of the patient: Brittney Fisher  [id number]  [birth year]   REASON FOR VISIT follow-up for lung cancer  PERTINENT ONCOLOGY HISTORY 60 y.o. female with past medical history including GERD, COPD, current everyday smoker presents for follow-up of lung mass and pleural effusion. 10/20/2020, patient was brought to ED via EMS due to generalized weakness, dizziness, chest pain shortness of breath.  She was recently diagnosed with COPD approximately 1 month ago by primary care provider.  Also unintentional weight loss during the last few months. Image work-up showed right-sided pleural effusion.  She underwent right thoracentesis.  With removal of 2.4 L of fluid. 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  Metastatic disease to low cervical/thoracic nodes, lung, right pleural space and bones.  Moderate to large right pleural effusion.  SVC narrowing. 10/21/2020 MRI brain is negative for metastasis.  Mild chronic microvascular ischemic changes in the white matter.  Negative for acute infarct Regarding to the SVC narrowing, patient was seen by vascular surgeon and was recommended no intervention inpatient.  Patient to follow-up outpatient with vascular surgeon for evaluation. Patient also was treated for COPD exacerbation with hypoxia on exertion. Qualifies for home oxygen and hospitalist arrange home health and a nebulizer.  Patient was given a course of prednisone taper and empiric Levaquin. Patient was discharged and present today to follow-up with cytology results and further management plan  10/27/2020 PET scan showed right upper lobe primary bronchogenic mass with direct invasion into  mediastinum. Metastatic disease to the right pleural space, lung, bone, nodes of the chest and less so lower leg/upper abdomen. Hypermetabolic him along the course of the right axillary vein with concurrent subtle hyper attenuation within the axillary vein and SVC.  This continues to the level of SVC narrowing.  Suspicious for SVC occlusion and developing thrombus. Moderate right pleural effusion and small pericardial effusion.  # # left supraclavicular mass biopsy- pathology is positive for adenocarcinoma. positive for CK7, with patchy, weak CK20.  They are  negative for TTF1, NapsinA, GATA3, CDX2, and Pax8.  The findings are  nonspecific; but may be compatible with a poorly differentiated  adenocarcinoma of lung origin, especially given the imaging findings  She also had another repeat thoracentesis and fluid cytology showed malignancy.  #NGS showed AXIN 1 [id number], DIS2 M131f, KRAS G12C, PRDM1 M1 [id number], [id number] MS stable, TPS <1%   11/14/2020- 12/12/2020.  #Bronchial obstruction and SVC occlusion.  s/p palliative radiation #After 2 cycles of chemtherapy, patient had CT scan done due to shortness of breath.  01/05/2021, CT chest angiogram PE protocol showed no evidence of pulmonary embolism.  Stable disease.  Right upper lobe mass about 6.7 x 5.3 cm.  Widespread metastatic disease, lung and bone involvement. Interval development of right-sided hydronephrosis. 03/06/2021 finished T2 palliative radiation 03/22/2021, 6 cycles of carboplatin/Alimta/bevacizumab.  Infusion reaction of carboplatin during cycle 6.  Carboplatin discontinued. 04/07/2021 CT chest abdomen pelvis-partial response Mild decrease in size of right lung mass, mildly decreased mediastinal and hilar adenopathy.  Extensive right-sided pleural metastasis unchanged.  Similar appearance of diffuse bilateral pulmonary nodules.  Multifocal lytic sclerotic bone metastasis.  New superior endplate deformity of T6 04/12/2021 -  09/08/2021, patient was continued on bevacizumab and Alimta.  09/21/2021, bone scan  showed multifocal abnormal uptake including mid to distal shaft of right femur, left femoral neck and trochanter, left sacroiliac region, left ischium.  Focal activity at the proximal tibia on the left.  Multi focal heterogeneous bilateral rib activity.  Focal activity at the sternum. 09/27/2021, CT chest abdomen pelvis with contrast showed progression of infrahilar left lower lobe with a concerning clearly progressive masslike consolidation opacity.  Mild progression of small left pleural effusion.  Similar experience of extensive irregular right-sided pleural disease.  Similar experience of bony metastasis.  09/29/2021 extra cycle of maintenance bevacizumab and Alimta while waiting for approval of Lumakras  INTERVAL HISTORY [name] is a 60 y.o. female who has above history reviewed by me today presents for follow up visit for management of metastatic lung cancer, acute visit for swallowing pain.  Problems and complaints are listed below Patient was accompanied by her daughter. She continues to have right lower extremity pain for which she takes narcotics.   She feels tired after last chemotherapy. Appetite is poor and she has lost weight.  Review of Systems  Constitutional:  Positive for unexpected weight change. Negative for chills, fatigue and fever.  HENT:   Negative for hearing loss and voice change.   Eyes:  Negative for eye problems.  Respiratory:  Negative for chest tightness, cough and shortness of breath.   Cardiovascular:  Negative for chest pain.  Gastrointestinal:  Negative for abdominal distention, abdominal pain and blood in stool.  Endocrine: Negative for hot flashes.  Genitourinary:  Negative for difficulty urinating and frequency.   Musculoskeletal:  Negative for arthralgias.       Right lower extremity pain  Skin:  Negative for itching and rash.  Neurological:  Negative for  extremity weakness.  Hematological:  Negative for adenopathy.  Psychiatric/Behavioral:  Negative for confusion.      Allergies  Allergen Reactions   Carboplatin Shortness Of Breath, Itching and Cough     Past Medical History:  Diagnosis Date   COPD (chronic obstructive pulmonary disease) (HCC)    GERD (gastroesophageal reflux disease)    Metastatic lung cancer (metastasis from lung to other site) ([place]) 11/02/2020     Past Surgical History:  Procedure Laterality Date   COLONOSCOPY WITH ESOPHAGOGASTRODUODENOSCOPY (EGD)     COLONOSCOPY WITH PROPOFOL N/A 11/14/2015   Procedure: COLONOSCOPY WITH PROPOFOL;  Surgeon: Hulen Luster, MD;  Location: [facility] ENDOSCOPY;  Service: Gastroenterology;  Laterality: N/A;   ESOPHAGOGASTRODUODENOSCOPY (EGD) WITH PROPOFOL N/A 11/14/2015   Procedure: ESOPHAGOGASTRODUODENOSCOPY (EGD) WITH PROPOFOL;  Surgeon: Hulen Luster, MD;  Location: [facility] ENDOSCOPY;  Service: Gastroenterology;  Laterality: N/A;    Social History   Socioeconomic History   Marital status: Married    Spouse name: Not on file   Number of children: Not on file   Years of education: Not on file   Highest education level: Not on file  Occupational History   Not on file  Tobacco Use   Smoking status: Every Day    Packs/day: 0.20    Types: Cigarettes   Smokeless tobacco: Never  Substance and Sexual Activity   Alcohol use: Never   Drug use: Never   Sexual activity: Not Currently  Other Topics Concern   Not on file  Social History Narrative   Not on file   Social Determinants of Health   Financial Resource Strain: Not on file  Food Insecurity: Not on file  Transportation Needs: Not on file  Physical Activity: Not on file  Stress: Not  on file  Social Connections: Not on file  Intimate Partner Violence: Not on file    Family History  Problem Relation Age of Onset   Breast cancer Cousin 79   Diabetes type II Mother    Hypertension Mother    Colon cancer Father     Hypertension Father      Current Outpatient Medications:    acetaminophen (TYLENOL) 500 MG tablet, Take 1,000 mg by mouth every 8 (eight) hours as needed for moderate pain. , Disp: , Rfl:    benzonatate (TESSALON) 100 MG capsule, Take 1 capsule (100 mg total) by mouth 3 (three) times daily as needed for cough., Disp: 60 capsule, Rfl: 1   dexamethasone (DECADRON) 4 MG tablet, Take 1 tab (4 mg) twice a day the day before Alimta chemo. Take 2 tablets (8 mg) po daily x 3 days starting the day after chemo., Disp: 30 tablet, Rfl: 1   folic acid (FOLVITE) 1 MG tablet, Take 1 tablet (1 mg total) by mouth daily. Start 5-7 days before Alimta chemotherapy. Continue until 21 days after Alimta completed., Disp: 100 tablet, Rfl: 3   guaiFENesin (MUCINEX) 600 MG 12 hr tablet, Take 1 tablet (600 mg total) by mouth 2 (two) times daily., Disp: 14 tablet, Rfl: 0   magic mouthwash w/lidocaine SOLN, Take 5 mLs by mouth 4 (four) times daily as needed for mouth pain. Sig: Swish/Swallow 5-10 ml four times a day as needed. Dispense 480 ml. 1RF, Disp: 480 mL, Rfl: 1   ondansetron (ZOFRAN) 8 MG tablet, Take 1 tablet (8 mg total) by mouth 2 (two) times daily as needed for refractory nausea / vomiting. Start on day 3 after chemo., Disp: 30 tablet, Rfl: 1   oxyCODONE (OXY IR/ROXICODONE) 5 MG immediate release tablet, Take 1 tablet (5 mg total) by mouth every 6 (six) hours as needed for moderate pain or severe pain., Disp: 120 tablet, Rfl: 0   Polyethylene Glycol 3350 (MIRALAX PO), Take by mouth daily as needed., Disp: , Rfl:    protein supplement shake (PREMIER PROTEIN) LIQD, Take 2 oz by mouth 4 (four) times daily., Disp: , Rfl:    senna-docusate (SENOKOT-S) 8.6-50 MG tablet, Take 2 tablets by mouth daily., Disp: 60 tablet, Rfl: 3   sotorasib (LUMAKRAS) 120 MG TABS, Take 960 mg by mouth daily., Disp: 240 tablet, Rfl: 0   calcium carbonate (TUMS - DOSED IN MG ELEMENTAL CALCIUM) 500 MG chewable tablet, Chew 1 tablet by mouth as  needed., Disp: , Rfl:    ipratropium-albuterol (DUONEB) 0.5-2.5 (3) MG/3ML SOLN, Take 3 mLs by nebulization 3 (three) times daily. (Patient not taking: No sig reported), Disp: 360 mL, Rfl: 1   prochlorperazine (COMPAZINE) 10 MG tablet, Take 1 tablet (10 mg total) by mouth every 6 (six) hours as needed (Nausea or vomiting). (Patient not taking: No sig reported), Disp: 30 tablet, Rfl: 1   sucralfate (CARAFATE) 1 GM/10ML suspension, Take 10 mLs (1 g total) by mouth in the morning, at noon, and at bedtime. (Patient not taking: No sig reported), Disp: 420 mL, Rfl: 0   tiotropium (SPIRIVA HANDIHALER) 18 MCG inhalation capsule, Place 18 mcg into inhaler and inhale daily. (Patient not taking: No sig reported), Disp: , Rfl:   Physical exam: ECOG 1 Vitals:   10/20/21 1003  BP: 121/74  Pulse: (!) 106  Temp: 97.7 F (36.5 C)  TempSrc: Tympanic  Weight: 147 lb (66.7 kg)   Physical Exam Constitutional:      General: She is  not in acute distress.    Comments: Thin built female walks independently  HENT:     Head: Normocephalic and atraumatic.  Eyes:     General: No scleral icterus. Cardiovascular:     Rate and Rhythm: Normal rate and regular rhythm.     Heart sounds: Normal heart sounds.  Pulmonary:     Effort: Pulmonary effort is normal. No respiratory distress.     Breath sounds: No wheezing.     Comments: Slightly decreased breath sound bilaterally.  Abdominal:     General: Bowel sounds are normal. There is no distension.     Palpations: Abdomen is soft.  Musculoskeletal:        General: No deformity. Normal range of motion.     Cervical back: Normal range of motion and neck supple.  Skin:    General: Skin is warm and dry.     Findings: No erythema or rash.  Neurological:     Mental Status: She is alert and oriented to person, place, and time. Mental status is at baseline.     Cranial Nerves: No cranial nerve deficit.     Coordination: Coordination normal.  Psychiatric:        Mood  and Affect: Mood normal.    CMP Latest Ref Rng & Units 10/20/2021  Glucose 70 - 99 mg/dL 98  BUN 8 - 23 mg/dL 19  Creatinine 0.44 - 1.00 mg/dL 0.86  Sodium 135 - 145 mmol/L 139  Potassium 3.5 - 5.1 mmol/L 3.5  Chloride 98 - 111 mmol/L 102  CO2 22 - 32 mmol/L 26  Calcium 8.9 - 10.3 mg/dL 8.9  Total Protein 6.5 - 8.1 g/dL 7.8  Total Bilirubin 0.3 - 1.2 mg/dL 0.4  Alkaline Phos 38 - 126 U/L 75  AST 15 - 41 U/L 27  ALT 0 - 44 U/L 18   CBC Latest Ref Rng & Units 10/20/2021  WBC 4.0 - 10.5 K/uL 8.6  Hemoglobin 12.0 - 15.0 g/dL 11.4(L)  Hematocrit 36.0 - 46.0 % 35.7(L)  Platelets 150 - 400 K/uL 336    RADIOGRAPHIC STUDIES: I have personally reviewed the radiological images as listed and agreed with the findings in the report. NM Bone Scan Whole Body  Result Date: 09/24/2021 CLINICAL DATA:  Lung cancer bone Mets EXAM: NUCLEAR MEDICINE WHOLE BODY BONE SCAN TECHNIQUE: Whole body anterior and posterior images were obtained approximately 3 hours after intravenous injection of radiopharmaceutical. RADIOPHARMACEUTICALS:  20.32 mCi Technetium-47mMDP IV COMPARISON:  CT 07/04/2021, PET CT 10/27/2020 FINDINGS: Multifocal abnormal uptake visualized within the mid to distal shaft of right femur, left femoral neck and trochanter, left sacroiliac region, and left ischium. Focus of activity at the proximal tibia on the left. Multifocal heterogeneous bilateral rib activity. Focal activity at the sternum. Mild asymmetrical activity at the right [facility] and [facility] potentially degenerative. Excreted activity into the kidneys and bladder with possible mild right hydronephrosis. IMPRESSION: 1. Multifocal abnormal uptake as described above consistent with metastatic disease 2. Excreted activity into right renal collecting system with possible mild hydronephrosis on the right. Electronically Signed   By: KDonavan FoilM.D.   On: 09/24/2021 20:03   CT CHEST ABDOMEN PELVIS W CONTRAST  Result Date:  09/28/2021 CLINICAL DATA:  Lung cancer.  Restaging. EXAM: CT CHEST, ABDOMEN, AND PELVIS WITH CONTRAST TECHNIQUE: Multidetector CT imaging of the chest, abdomen and pelvis was performed following the standard protocol during bolus administration of intravenous contrast. CONTRAST:  1042mOMNIPAQUE IOHEXOL 300 MG/ML  SOLN  COMPARISON:  07/04/2021 FINDINGS: CT CHEST FINDINGS Cardiovascular: The heart size is normal. No substantial pericardial effusion. Mediastinum/Nodes: Amorphous nodal/metastatic tissue in the mediastinum is similar to prior. Precarinal disease measured previously at 18 mm is 17 mm today on 27/2. AP window lymph node measured previously at 13 mm is 11 mm today on 26/2. Lungs/Pleura: Masslike consolidative opacity in the infrahilar left lower lobe appears progressive with 6.7 x 3.8 cm lesion on 47/2 increased from 4.6 x 2.6 cm (remeasured) previously the extensive irregular right-sided pleural disease is similar although reproducible measurement is difficult due to irregular shape. Plaque-like disease along the right heart border on image 37/2 measures 6.5 x 1.8 cm today which compares to 6.6 x 2.0 cm when remeasured in a similar fashion on the prior study. 13 mm left lower lobe nodule on 48/2 was 8 mm previously (remeasured). And while 13 mm right lower lobe nodule on 48/2 was 8 mm previously (remeasured). 13 mm right lower lobe nodule on 106/3 was 12 mm previously. Interval progression of small left pleural effusion. Musculoskeletal: Lytic T2 lesion is similar to prior. CT ABDOMEN PELVIS FINDINGS Hepatobiliary: No suspicious focal abnormality within the liver parenchyma. There is no evidence for gallstones, gallbladder wall thickening, or pericholecystic fluid. No intrahepatic or extrahepatic biliary dilation. Pancreas: No focal mass lesion. No dilatation of the main duct. No intraparenchymal cyst. No peripancreatic edema. Spleen: No splenomegaly. No focal mass lesion. Adrenals/Urinary Tract: No  adrenal nodule or mass. Bilateral renal cysts again noted. No evidence for hydroureter. Bladder is decompressed. Stomach/Bowel: Stomach is distended with contrast material. Duodenum is normally positioned as is the ligament of Treitz. No small bowel wall thickening. No small bowel dilatation. The terminal ileum is normal. The appendix is normal. No gross colonic mass. No colonic wall thickening. Vascular/Lymphatic: There is advanced atherosclerotic calcification of the abdominal aorta without aneurysm. There is no gastrohepatic or hepatoduodenal ligament lymphadenopathy. No retroperitoneal or mesenteric lymphadenopathy. No pelvic sidewall lymphadenopathy. Reproductive: The uterus is unremarkable.  There is no adnexal mass. Other: No intraperitoneal free fluid. Musculoskeletal: Scattered sclerotic bone lesions again noted, similar to prior. Lucent lesion posterior left iliac bone is stable. IMPRESSION: 1. No generalized trend towards improving or progressive disease. Some of the index mediastinal disease measures stable to minimally smaller today many of the pulmonary nodules appear similar, some have progressed. The most concerning progression is in the infrahilar left lower lobe with a concerning clearly progressive masslike consolidative opacity. 2. Mild progression of small left pleural effusion. 3. Similar appearance of extensive irregular right-sided pleural disease. 4. Similar appearance of bony metastatic involvement. 5. Aortic Atherosclerosis (ICD10-I70.0). Electronically Signed   By: Misty Stanley M.D.   On: 09/28/2021 20:41       Assessment and plan  1. Need for prophylactic vaccination and inoculation against influenza   2. Encounter for antineoplastic chemotherapy   3. Bone metastasis ([place])   4. Neoplasm related pain   5. 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  cT4 N3 M1-  KRASG12C mutation [Patient has high TMB, TPS less than 1%.  Immunotherapy in combination with chemotherapy will be other options in the future if lumakras is not effective]   Labs reviewed and discussed with patient. Rationale and potential side  effects of Lumakras are reviewed and discussed with patient. Patient meets oral chemotherapy pharmacist today as well for further discussion Start today.  Decreased appetite.  Weight loss, recommend patient to follow-up with nutritionist. Bone metastasis, will refer patient back to see radiation oncology for palliative radiation. Neoplasm related pain, continue current pain regimen.  Lab MD  1 week evaluation of Lumakras tolerability  [name], MD, PhD [facility] Hematology Oncology 10/20/2021

## 2021-10-26 ENCOUNTER — Inpatient Hospital Stay (HOSPITAL_BASED_OUTPATIENT_CLINIC_OR_DEPARTMENT_OTHER): Payer: BC Managed Care – PPO | Admitting: Oncology

## 2021-10-26 ENCOUNTER — Encounter: Payer: Self-pay | Admitting: Oncology

## 2021-10-26 ENCOUNTER — Inpatient Hospital Stay: Payer: BC Managed Care – PPO

## 2021-10-26 ENCOUNTER — Inpatient Hospital Stay: Payer: BC Managed Care – PPO | Admitting: Pharmacist

## 2021-10-26 ENCOUNTER — Other Ambulatory Visit: Payer: Self-pay

## 2021-10-26 VITALS — BP 101/61 | HR 111 | Temp 98.2°F | Resp 16 | Ht 71.0 in | Wt 149.5 lb

## 2021-10-26 DIAGNOSIS — C3411 Malignant neoplasm of upper lobe, right bronchus or lung: Secondary | ICD-10-CM | POA: Diagnosis not present

## 2021-10-26 DIAGNOSIS — C3490 Malignant neoplasm of unspecified part of unspecified bronchus or lung: Secondary | ICD-10-CM

## 2021-10-26 DIAGNOSIS — G893 Neoplasm related pain (acute) (chronic): Secondary | ICD-10-CM

## 2021-10-26 DIAGNOSIS — Z5111 Encounter for antineoplastic chemotherapy: Secondary | ICD-10-CM

## 2021-10-26 DIAGNOSIS — R634 Abnormal weight loss: Secondary | ICD-10-CM | POA: Diagnosis not present

## 2021-10-26 DIAGNOSIS — C7951 Secondary malignant neoplasm of bone: Secondary | ICD-10-CM | POA: Diagnosis not present

## 2021-10-26 LAB — COMPREHENSIVE METABOLIC PANEL
ALT: 19 U/L (ref 0–44)
AST: 26 U/L (ref 15–41)
Albumin: 3.1 g/dL — ABNORMAL LOW (ref 3.5–5.0)
Alkaline Phosphatase: 82 U/L (ref 38–126)
Anion gap: 8 (ref 5–15)
BUN: 11 mg/dL (ref 8–23)
CO2: 29 mmol/L (ref 22–32)
Calcium: 9 mg/dL (ref 8.9–10.3)
Chloride: 101 mmol/L (ref 98–111)
Creatinine, Ser: 0.71 mg/dL (ref 0.44–1.00)
GFR, Estimated: >60 mL/min (ref >60)
Glucose, Bld: 130 mg/dL — ABNORMAL HIGH (ref 70–99)
Potassium: 4.2 mmol/L (ref 3.5–5.1)
Sodium: 138 mmol/L (ref 135–145)
Total Bilirubin: 0.2 mg/dL — ABNORMAL LOW (ref 0.3–1.2)
Total Protein: 7.4 g/dL (ref 6.5–8.1)

## 2021-10-26 LAB — CBC WITH DIFFERENTIAL/PLATELET
Abs Immature Granulocytes: 0.03 10*3/uL (ref 0.00–0.07)
Basophils Absolute: 0 10*3/uL (ref 0.0–0.1)
Basophils Relative: 0 %
Eosinophils Absolute: 0.1 10*3/uL (ref 0.0–0.5)
Eosinophils Relative: 1 %
HCT: 38.3 % (ref 36.0–46.0)
Hemoglobin: 12 g/dL (ref 12.0–15.0)
Immature Granulocytes: 1 %
Lymphocytes Relative: 16 %
Lymphs Abs: 0.9 10*3/uL (ref 0.7–4.0)
MCH: 26.4 pg (ref 26.0–34.0)
MCHC: 31.3 g/dL (ref 30.0–36.0)
MCV: 84.4 fL (ref 80.0–100.0)
Monocytes Absolute: 0.6 10*3/uL (ref 0.1–1.0)
Monocytes Relative: 9 %
Neutro Abs: 4.4 10*3/uL (ref 1.7–7.7)
Neutrophils Relative %: 73 %
Platelets: 259 10*3/uL (ref 150–400)
RBC: 4.54 MIL/uL (ref 3.87–5.11)
RDW: 18.3 % — ABNORMAL HIGH (ref 11.5–15.5)
WBC: 6 10*3/uL (ref 4.0–10.5)
nRBC: 0 % (ref 0.0–0.2)

## 2021-10-26 NOTE — Progress Notes (Signed)
Nutrition Follow-up:  Patient with metastatic non small cell lung cancer.  Progression noted and started on lumakras, oral chemotherapy.   Met with patient and niece.  Patient reports that new oral chemo pill has increased appetite some.  Gained 2 lb recently.  Eats breakfast about 2 times per week.  Eats mostly fruit each morning before taking medication.  Lunch is left overs from dinner.  Likes pig feet and vegetables, fish/seafood.  Drinks premier protein shakes but has not been drinking them recently as they feel her up.  Patient reports taste alterations.  No diarrhea or nausea.  Reports that she wants to increase water intake    Medications: reviewed  Labs: reviewed  Anthropometrics:   Weight 149 lb today  147 lb on 11/4 159 lb on 9/23 03/2021 149 lb    NUTRITION DIAGNOSIS: Inadequate oral intake continues   INTERVENTION:  Encouraged protein food at every meal Discussed strategies to help with taste change.  Handout provided Goals given for fluid intake daily   MONITORING, EVALUATION, GOAL: Weight trends, intake   NEXT VISIT: Thursday, Dec 8 phone call  Brittney Fisher B. [name], [place], [place] Registered Dietitian [phone] (mobile)

## 2021-10-26 NOTE — Progress Notes (Signed)
Pt feels good today, she does not feel that she needs fluids but she will see what the labs look like.

## 2021-10-26 NOTE — Progress Notes (Signed)
Hematology/Oncology Follow Up Note [phone] [phone]  Patient Care Team: Pcp, No as PCP - General Telford Nab, RN as Oncology Nurse Navigator [name], MD as Medical Oncologist (Oncology)   Name of the patient: Brittney Fisher  [id number]  05-10-1961   REASON FOR VISIT follow-up for lung cancer  PERTINENT ONCOLOGY HISTORY 60 y.o. female with past medical history including GERD, COPD, current everyday smoker presents for follow-up of lung mass and pleural effusion. 10/20/2020, patient was brought to ED via EMS due to generalized weakness, dizziness, chest pain shortness of breath.  She was recently diagnosed with COPD approximately 1 month ago by primary care provider.  Also unintentional weight loss during the last few months. Image work-up showed right-sided pleural effusion.  She underwent right thoracentesis.  With removal of 2.4 L of fluid. 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  Metastatic disease to low cervical/thoracic nodes, lung, right pleural space and bones.  Moderate to large right pleural effusion.  SVC narrowing. 10/21/2020 MRI brain is negative for metastasis.  Mild chronic microvascular ischemic changes in the white matter.  Negative for acute infarct Regarding to the SVC narrowing, patient was seen by vascular surgeon and was recommended no intervention inpatient.  Patient to follow-up outpatient with vascular surgeon for evaluation. Patient also was treated for COPD exacerbation with hypoxia on exertion. Qualifies for home oxygen and hospitalist arrange home health and a nebulizer.  Patient was given a course of prednisone taper and empiric Levaquin. Patient was discharged and present today to follow-up with cytology results and further management plan  10/27/2020 PET scan showed right upper lobe primary bronchogenic mass with direct invasion into  mediastinum. Metastatic disease to the right pleural space, lung, bone, nodes of the chest and less so lower leg/upper abdomen. Hypermetabolic him along the course of the right axillary vein with concurrent subtle hyper attenuation within the axillary vein and SVC.  This continues to the level of SVC narrowing.  Suspicious for SVC occlusion and developing thrombus. Moderate right pleural effusion and small pericardial effusion.  # # left supraclavicular mass biopsy- pathology is positive for adenocarcinoma. positive for CK7, with patchy, weak CK20.  They are  negative for TTF1, NapsinA, GATA3, CDX2, and Pax8.  The findings are  nonspecific; but may be compatible with a poorly differentiated  adenocarcinoma of lung origin, especially given the imaging findings  She also had another repeat thoracentesis and fluid cytology showed malignancy.  #NGS showed AXIN 1 [id number], DIS2 M179f, KRAS G12C, PRDM1 M1 [id number], [id number] MS stable, TPS <1%   11/14/2020- 12/12/2020.  #Bronchial obstruction and SVC occlusion.  s/p palliative radiation #After 2 cycles of chemtherapy, patient had CT scan done due to shortness of breath.  01/05/2021, CT chest angiogram PE protocol showed no evidence of pulmonary embolism.  Stable disease.  Right upper lobe mass about 6.7 x 5.3 cm.  Widespread metastatic disease, lung and bone involvement. Interval development of right-sided hydronephrosis. 03/06/2021 finished T2 palliative radiation 03/22/2021, 6 cycles of carboplatin/Alimta/bevacizumab.  Infusion reaction of carboplatin during cycle 6.  Carboplatin discontinued. 04/07/2021 CT chest abdomen pelvis-partial response Mild decrease in size of right lung mass, mildly decreased mediastinal and hilar adenopathy.  Extensive right-sided pleural metastasis unchanged.  Similar appearance of diffuse bilateral pulmonary nodules.  Multifocal lytic sclerotic bone metastasis.  New superior endplate deformity of T6 04/12/2021 -  09/08/2021, patient was continued on bevacizumab and Alimta.  09/21/2021, bone scan  showed multifocal abnormal uptake including mid to distal shaft of right femur, left femoral neck and trochanter, left sacroiliac region, left ischium.  Focal activity at the proximal tibia on the left.  Multi focal heterogeneous bilateral rib activity.  Focal activity at the sternum. 09/27/2021, CT chest abdomen pelvis with contrast showed progression of infrahilar left lower lobe with a concerning clearly progressive masslike consolidation opacity.  Mild progression of small left pleural effusion.  Similar experience of extensive irregular right-sided pleural disease.  Similar experience of bony metastasis.  09/29/2021 extra cycle of maintenance bevacizumab and Alimta while waiting for approval of Lumakras  10/20/2021 started on [place] Brittney Fisher is a 60 y.o. female who has above history reviewed by me today presents for follow up visit for management of metastatic lung cancer, acute visit for swallowing pain.  Problems and complaints are listed below Patient was accompanied by her daughter. She continues to have right lower extremity pain for which she takes narcotics.   Mild SOB with exertion at baseline.  Patient was started onLumakras and overall tolerates well.  Feels good today.  She has gained 2 pounds. She has an appointment with nutritionist today.    Review of Systems  Constitutional:  Positive for unexpected weight change. Negative for chills, fatigue and fever.  HENT:   Negative for hearing loss and voice change.   Eyes:  Negative for eye problems.  Respiratory:  Negative for chest tightness, cough and shortness of breath.   Cardiovascular:  Negative for chest pain.  Gastrointestinal:  Negative for abdominal distention, abdominal pain and blood in stool.  Endocrine: Negative for hot flashes.  Genitourinary:  Negative for difficulty urinating and frequency.    Musculoskeletal:  Negative for arthralgias.       Right lower extremity pain  Skin:  Negative for itching and rash.  Neurological:  Negative for extremity weakness.  Hematological:  Negative for adenopathy.  Psychiatric/Behavioral:  Negative for confusion.      Allergies  Allergen Reactions   Carboplatin Shortness Of Breath, Itching and Cough     Past Medical History:  Diagnosis Date   COPD (chronic obstructive pulmonary disease) (HCC)    GERD (gastroesophageal reflux disease)    Metastatic lung cancer (metastasis from lung to other site) ([place]) 11/02/2020     Past Surgical History:  Procedure Laterality Date   COLONOSCOPY WITH ESOPHAGOGASTRODUODENOSCOPY (EGD)     COLONOSCOPY WITH PROPOFOL N/A 11/14/2015   Procedure: COLONOSCOPY WITH PROPOFOL;  Surgeon: Hulen Luster, MD;  Location: [facility] ENDOSCOPY;  Service: Gastroenterology;  Laterality: N/A;   ESOPHAGOGASTRODUODENOSCOPY (EGD) WITH PROPOFOL N/A 11/14/2015   Procedure: ESOPHAGOGASTRODUODENOSCOPY (EGD) WITH PROPOFOL;  Surgeon: Hulen Luster, MD;  Location: [facility] ENDOSCOPY;  Service: Gastroenterology;  Laterality: N/A;    Social History   Socioeconomic History   Marital status: Married    Spouse name: Not on file   Number of children: Not on file   Years of education: Not on file   Highest education level: Not on file  Occupational History   Not on file  Tobacco Use   Smoking status: Former    Types: Cigarettes    Quit date: 09/2021    Years since quitting: 0.1   Smokeless tobacco: Never  Vaping Use   Vaping Use: Never used  Substance and Sexual Activity   Alcohol use: Never   Drug use: Never   Sexual activity: Not Currently  Other Topics Concern   Not on file  Social  History Narrative   Not on file   Social Determinants of Health   Financial Resource Strain: Not on file  Food Insecurity: Not on file  Transportation Needs: Not on file  Physical Activity: Not on file  Stress: Not on file  Social Connections: Not  on file  Intimate Partner Violence: Not on file    Family History  Problem Relation Age of Onset   Breast cancer Cousin 47   Diabetes type II Mother    Hypertension Mother    Colon cancer Father    Hypertension Father      Current Outpatient Medications:    acetaminophen (TYLENOL) 500 MG tablet, Take 1,000 mg by mouth every 8 (eight) hours as needed for moderate pain. , Disp: , Rfl:    benzonatate (TESSALON) 100 MG capsule, Take 1 capsule (100 mg total) by mouth 3 (three) times daily as needed for cough., Disp: 60 capsule, Rfl: 1   calcium carbonate (TUMS - DOSED IN MG ELEMENTAL CALCIUM) 500 MG chewable tablet, Chew 1 tablet by mouth as needed., Disp: , Rfl:    folic acid (FOLVITE) 1 MG tablet, Take 1 tablet (1 mg total) by mouth daily. Start 5-7 days before Alimta chemotherapy. Continue until 21 days after Alimta completed., Disp: 100 tablet, Rfl: 3   guaiFENesin (MUCINEX) 600 MG 12 hr tablet, Take 1 tablet (600 mg total) by mouth 2 (two) times daily., Disp: 14 tablet, Rfl: 0   magic mouthwash w/lidocaine SOLN, Take 5 mLs by mouth 4 (four) times daily as needed for mouth pain. Sig: Swish/Swallow 5-10 ml four times a day as needed. Dispense 480 ml. 1RF, Disp: 480 mL, Rfl: 1   ondansetron (ZOFRAN) 8 MG tablet, Take 1 tablet (8 mg total) by mouth 2 (two) times daily as needed for refractory nausea / vomiting. Start on day 3 after chemo., Disp: 30 tablet, Rfl: 1   oxyCODONE (OXY IR/ROXICODONE) 5 MG immediate release tablet, Take 1 tablet (5 mg total) by mouth every 6 (six) hours as needed for moderate pain or severe pain., Disp: 120 tablet, Rfl: 0   Polyethylene Glycol 3350 (MIRALAX PO), Take by mouth daily as needed., Disp: , Rfl:    prochlorperazine (COMPAZINE) 10 MG tablet, Take 1 tablet (10 mg total) by mouth every 6 (six) hours as needed (Nausea or vomiting)., Disp: 30 tablet, Rfl: 1   protein supplement shake (PREMIER PROTEIN) LIQD, Take 2 oz by mouth 4 (four) times daily., Disp: , Rfl:     sotorasib (LUMAKRAS) 120 MG TABS, Take 960 mg by mouth daily., Disp: 240 tablet, Rfl: 0   ipratropium-albuterol (DUONEB) 0.5-2.5 (3) MG/3ML SOLN, Take 3 mLs by nebulization 3 (three) times daily. (Patient not taking: No sig reported), Disp: 360 mL, Rfl: 1   senna-docusate (SENOKOT-S) 8.6-50 MG tablet, Take 2 tablets by mouth daily. (Patient not taking: Reported on 10/26/2021), Disp: 60 tablet, Rfl: 3   sucralfate (CARAFATE) 1 GM/10ML suspension, Take 10 mLs (1 g total) by mouth in the morning, at noon, and at bedtime. (Patient not taking: No sig reported), Disp: 420 mL, Rfl: 0   tiotropium (SPIRIVA HANDIHALER) 18 MCG inhalation capsule, Place 18 mcg into inhaler and inhale daily. (Patient not taking: No sig reported), Disp: , Rfl:   Physical exam: ECOG 1 Vitals:   10/26/21 0901  BP: 101/61  Pulse: (!) 111  Resp: 16  Temp: 98.2 F (36.8 C)  TempSrc: Tympanic  Weight: 149 lb 8 oz (67.8 kg)  Height: '5\' 11"'  (1.803 m)  Physical Exam Constitutional:      General: She is not in acute distress.    Comments: Thin built female walks independently  HENT:     Head: Normocephalic and atraumatic.  Eyes:     General: No scleral icterus. Cardiovascular:     Rate and Rhythm: Normal rate and regular rhythm.     Heart sounds: Normal heart sounds.  Pulmonary:     Effort: Pulmonary effort is normal. No respiratory distress.     Breath sounds: No wheezing.     Comments: Slightly decreased breath sound bilaterally.  Abdominal:     General: Bowel sounds are normal. There is no distension.     Palpations: Abdomen is soft.  Musculoskeletal:        General: No deformity. Normal range of motion.     Cervical back: Normal range of motion and neck supple.  Skin:    General: Skin is warm and dry.     Findings: No erythema or rash.  Neurological:     Mental Status: She is alert and oriented to person, place, and time. Mental status is at baseline.     Cranial Nerves: No cranial nerve deficit.      Coordination: Coordination normal.  Psychiatric:        Mood and Affect: Mood normal.    CMP Latest Ref Rng & Units 10/26/2021  Glucose 70 - 99 mg/dL 130(H)  BUN 8 - 23 mg/dL 11  Creatinine 0.44 - 1.00 mg/dL 0.71  Sodium 135 - 145 mmol/L 138  Potassium 3.5 - 5.1 mmol/L 4.2  Chloride 98 - 111 mmol/L 101  CO2 22 - 32 mmol/L 29  Calcium 8.9 - 10.3 mg/dL 9.0  Total Protein 6.5 - 8.1 g/dL 7.4  Total Bilirubin 0.3 - 1.2 mg/dL 0.2(L)  Alkaline Phos 38 - 126 U/L 82  AST 15 - 41 U/L 26  ALT 0 - 44 U/L 19   CBC Latest Ref Rng & Units 10/26/2021  WBC 4.0 - 10.5 K/uL 6.0  Hemoglobin 12.0 - 15.0 g/dL 12.0  Hematocrit 36.0 - 46.0 % 38.3  Platelets 150 - 400 K/uL 259    RADIOGRAPHIC STUDIES: I have personally reviewed the radiological images as listed and agreed with the findings in the report. NM Bone Scan Whole Body  Result Date: 09/24/2021 CLINICAL DATA:  Lung cancer bone Mets EXAM: NUCLEAR MEDICINE WHOLE BODY BONE SCAN TECHNIQUE: Whole body anterior and posterior images were obtained approximately 3 hours after intravenous injection of radiopharmaceutical. RADIOPHARMACEUTICALS:  20.32 mCi Technetium-24mMDP IV COMPARISON:  CT 07/04/2021, PET CT 10/27/2020 FINDINGS: Multifocal abnormal uptake visualized within the mid to distal shaft of right femur, left femoral neck and trochanter, left sacroiliac region, and left ischium. Focus of activity at the proximal tibia on the left. Multifocal heterogeneous bilateral rib activity. Focal activity at the sternum. Mild asymmetrical activity at the right [facility] LP[facility] and shoulder potentially degenerative. Excreted activity into the kidneys and bladder with possible mild right hydronephrosis. IMPRESSION: 1. Multifocal abnormal uptake as described above consistent with metastatic disease 2. Excreted activity into right renal collecting system with possible mild hydronephrosis on the right. Electronically Signed   By: KDonavan FoilM.D.   On: 09/24/2021 20:03    CT CHEST ABDOMEN PELVIS W CONTRAST  Result Date: 09/28/2021 CLINICAL DATA:  Lung cancer.  Restaging. EXAM: CT CHEST, ABDOMEN, AND PELVIS WITH CONTRAST TECHNIQUE: Multidetector CT imaging of the chest, abdomen and pelvis was performed following the standard protocol during bolus administration of  intravenous contrast. CONTRAST:  134m OMNIPAQUE IOHEXOL 300 MG/ML  SOLN COMPARISON:  07/04/2021 FINDINGS: CT CHEST FINDINGS Cardiovascular: The heart size is normal. No substantial pericardial effusion. Mediastinum/Nodes: Amorphous nodal/metastatic tissue in the mediastinum is similar to prior. Precarinal disease measured previously at 18 mm is 17 mm today on 27/2. AP window lymph node measured previously at 13 mm is 11 mm today on 26/2. Lungs/Pleura: Masslike consolidative opacity in the infrahilar left lower lobe appears progressive with 6.7 x 3.8 cm lesion on 47/2 increased from 4.6 x 2.6 cm (remeasured) previously the extensive irregular right-sided pleural disease is similar although reproducible measurement is difficult due to irregular shape. Plaque-like disease along the right heart border on image 37/2 measures 6.5 x 1.8 cm today which compares to 6.6 x 2.0 cm when remeasured in a similar fashion on the prior study. 13 mm left lower lobe nodule on 48/2 was 8 mm previously (remeasured). And while 13 mm right lower lobe nodule on 48/2 was 8 mm previously (remeasured). 13 mm right lower lobe nodule on 106/3 was 12 mm previously. Interval progression of small left pleural effusion. Musculoskeletal: Lytic T2 lesion is similar to prior. CT ABDOMEN PELVIS FINDINGS Hepatobiliary: No suspicious focal abnormality within the liver parenchyma. There is no evidence for gallstones, gallbladder wall thickening, or pericholecystic fluid. No intrahepatic or extrahepatic biliary dilation. Pancreas: No focal mass lesion. No dilatation of the main duct. No intraparenchymal cyst. No peripancreatic edema. Spleen: No splenomegaly.  No focal mass lesion. Adrenals/Urinary Tract: No adrenal nodule or mass. Bilateral renal cysts again noted. No evidence for hydroureter. Bladder is decompressed. Stomach/Bowel: Stomach is distended with contrast material. Duodenum is normally positioned as is the ligament of Treitz. No small bowel wall thickening. No small bowel dilatation. The terminal ileum is normal. The appendix is normal. No gross colonic mass. No colonic wall thickening. Vascular/Lymphatic: There is advanced atherosclerotic calcification of the abdominal aorta without aneurysm. There is no gastrohepatic or hepatoduodenal ligament lymphadenopathy. No retroperitoneal or mesenteric lymphadenopathy. No pelvic sidewall lymphadenopathy. Reproductive: The uterus is unremarkable.  There is no adnexal mass. Other: No intraperitoneal free fluid. Musculoskeletal: Scattered sclerotic bone lesions again noted, similar to prior. Lucent lesion posterior left iliac bone is stable. IMPRESSION: 1. No generalized trend towards improving or progressive disease. Some of the index mediastinal disease measures stable to minimally smaller today many of the pulmonary nodules appear similar, some have progressed. The most concerning progression is in the infrahilar left lower lobe with a concerning clearly progressive masslike consolidative opacity. 2. Mild progression of small left pleural effusion. 3. Similar appearance of extensive irregular right-sided pleural disease. 4. Similar appearance of bony metastatic involvement. 5. Aortic Atherosclerosis (ICD10-I70.0). Electronically Signed   By: EMisty StanleyM.D.   On: 09/28/2021 20:41       Assessment and plan  1. Primary malignant neoplasm of lung metastatic to other site, unspecified laterality ([place]   2. Encounter for antineoplastic chemotherapy   3. Bone metastasis ([place]   4. Neoplasm related pain   5. 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  cT4 N3 M1-  KRASG12C mutation [Patient has high TMB, TPS less than 1%.  Immunotherapy in combination with chemotherapy will be other options in the future if lumakras  is not effective]    Labs are reviewed and discussed with patient. Continue Lumakras.  Clinically she tolerates well.  Encourage oral hydration.  Decreased appetite.  Weight loss, recommend patient to follow-up with nutritionist.  Bone metastasis, set up appointment with radiation oncology for palliative radiation. Neoplasm related pain, continue current pain regimen.  Lab MD 3 weeks evaluation of Lumakras tolerability  [name], MD, PhD [facility] Hematology Oncology 10/26/2021

## 2021-10-26 NOTE — Progress Notes (Signed)
[place]  [phone] [phone]  Patient Care Team: Pcp, No as PCP - General Telford Nab, RN as Oncology Nurse Navigator [name], MD as Medical Oncologist (Oncology)   Name of the patient: Brittney Fisher  [id number]  May 09, 1961   Date of visit: 10/26/21  HPI: Patient is a 60 y.o. female with progressive NSCLC, positive for KRAS G12C mutation. Previously treated with carboplatin/pemetrexed/bevacizumab, she will now transition to treatment with Lumakras (sotorasib). Treatment started 10/20/2021.  Reason for Consult: Oral chemotherapy follow-up for sotorasib therapy.   PAST MEDICAL HISTORY: Past Medical History:  Diagnosis Date   COPD (chronic obstructive pulmonary disease) (HCC)    GERD (gastroesophageal reflux disease)    Metastatic lung cancer (metastasis from lung to other site) ([place]) 11/02/2020    HEMATOLOGY/ONCOLOGY HISTORY:  Oncology History  Metastatic lung cancer (metastasis from lung to other site) [facility])  11/02/2020 Initial Diagnosis   Metastatic lung cancer (metastasis from lung to other site) [facility])   11/18/2020 -  Chemotherapy   Patient is on Treatment Plan : LUNG PEMEtrexed (Alimta) + CARBOplatin + Bevacizumab q21d x 1 cycle      Primary malignant neoplasm of lung metastatic to other site [facility])  11/04/2020 Initial Diagnosis   Primary malignant neoplasm of lung metastatic to other site [facility])   11/04/2020 Cancer Staging   Staging form: Lung, AJCC 8th Edition - Clinical stage from 11/04/2020: Stage IV (cT4, cN3, cM1) - Signed by [name], MD on 11/04/2020      ALLERGIES:  is allergic to carboplatin.  MEDICATIONS:  Current Outpatient Medications  Medication Sig Dispense Refill   acetaminophen (TYLENOL) 500 MG tablet Take 1,000 mg by mouth every 8 (eight) hours as needed for moderate pain.      benzonatate (TESSALON) 100 MG capsule Take 1 capsule (100 mg total) by mouth 3 (three) times  daily as needed for cough. 60 capsule 1   calcium carbonate (TUMS - DOSED IN MG ELEMENTAL CALCIUM) 500 MG chewable tablet Chew 1 tablet by mouth as needed.     folic acid (FOLVITE) 1 MG tablet Take 1 tablet (1 mg total) by mouth daily. Start 5-7 days before Alimta chemotherapy. Continue until 21 days after Alimta completed. 100 tablet 3   guaiFENesin (MUCINEX) 600 MG 12 hr tablet Take 1 tablet (600 mg total) by mouth 2 (two) times daily. 14 tablet 0   ipratropium-albuterol (DUONEB) 0.5-2.5 (3) MG/3ML SOLN Take 3 mLs by nebulization 3 (three) times daily. (Patient not taking: No sig reported) 360 mL 1   magic mouthwash w/lidocaine SOLN Take 5 mLs by mouth 4 (four) times daily as needed for mouth pain. Sig: Swish/Swallow 5-10 ml four times a day as needed. Dispense 480 ml. 1RF 480 mL 1   ondansetron (ZOFRAN) 8 MG tablet Take 1 tablet (8 mg total) by mouth 2 (two) times daily as needed for refractory nausea / vomiting. Start on day 3 after chemo. 30 tablet 1   oxyCODONE (OXY IR/ROXICODONE) 5 MG immediate release tablet Take 1 tablet (5 mg total) by mouth every 6 (six) hours as needed for moderate pain or severe pain. 120 tablet 0   Polyethylene Glycol 3350 (MIRALAX PO) Take by mouth daily as needed.     prochlorperazine (COMPAZINE) 10 MG tablet Take 1 tablet (10 mg total) by mouth every 6 (six) hours as needed (Nausea or vomiting). 30 tablet 1   protein supplement shake (PREMIER PROTEIN) LIQD Take 2 oz by mouth 4 (  four) times daily.     senna-docusate (SENOKOT-S) 8.6-50 MG tablet Take 2 tablets by mouth daily. (Patient not taking: Reported on 10/26/2021) 60 tablet 3   sotorasib (LUMAKRAS) 120 MG TABS Take 960 mg by mouth daily. 240 tablet 0   sucralfate (CARAFATE) 1 GM/10ML suspension Take 10 mLs (1 g total) by mouth in the morning, at noon, and at bedtime. (Patient not taking: No sig reported) 420 mL 0   tiotropium (SPIRIVA HANDIHALER) 18 MCG inhalation capsule Place 18 mcg into inhaler and inhale daily.  (Patient not taking: No sig reported)     No current facility-administered medications for this visit.    VITAL SIGNS: There were no vitals taken for this visit. There were no vitals filed for this visit.  Estimated body mass index is 20.85 kg/m as calculated from the following:   Height as of an earlier encounter on 10/26/21: '5\' 11"'  (1.803 m).   Weight as of an earlier encounter on 10/26/21: 67.8 kg (149 lb 8 oz).  LABS: CBC:    Component Value Date/Time   WBC 6.0 10/26/2021 0823   HGB 12.0 10/26/2021 0823   HCT 38.3 10/26/2021 0823   PLT 259 10/26/2021 0823   MCV 84.4 10/26/2021 0823   NEUTROABS 4.4 10/26/2021 0823   LYMPHSABS 0.9 10/26/2021 0823   MONOABS 0.6 10/26/2021 0823   EOSABS 0.1 10/26/2021 0823   BASOSABS 0.0 10/26/2021 0823   Comprehensive Metabolic Panel:    Component Value Date/Time   NA 138 10/26/2021 0823   K 4.2 10/26/2021 0823   CL 101 10/26/2021 0823   CO2 29 10/26/2021 0823   BUN 11 10/26/2021 0823   CREATININE 0.71 10/26/2021 0823   GLUCOSE 130 (H) 10/26/2021 0823   CALCIUM 9.0 10/26/2021 0823   AST 26 10/26/2021 0823   ALT 19 10/26/2021 0823   ALKPHOS 82 10/26/2021 0823   BILITOT 0.2 (L) 10/26/2021 0823   PROT 7.4 10/26/2021 0823   ALBUMIN 3.1 (L) 10/26/2021 0823     Present during today's visit: patient and her niece [name]  Assessment and Plan: CMP/CBC reviewed during visit, continue sotorasib 962m daily   Oral Chemotherapy Side Effect/Intolerance:  No reported diarrhea, nausea, fatigue, and her cough has improved  Oral Chemotherapy Adherence: no missed doses since starting last week No patient barriers to medication adherence identified.   New medications: None reported  Medication Access Issues: no issues, [facility] Patient expressed understanding and was in agreement with this plan. She also understands that She can call clinic at any time with any questions, concerns, or complaints.    Follow-up plan: RTC in 3 weeks  Thank you for allowing me to participate in the care of this very pleasant patient.   Time Total: 15 mins  Visit consisted of counseling and education on dealing with issues of symptom management in the setting of serious and potentially life-threatening illness.Greater than 50%  of this time was spent counseling and coordinating care related to the above assessment and plan.  Signed by: ADarl Pikes PharmD, BCPS, BSalley Slaughter CPP Hematology/Oncology Clinical Pharmacist Practitioner [phone] 10/26/2021 9:20 AM

## 2021-11-01 ENCOUNTER — Other Ambulatory Visit: Payer: Self-pay

## 2021-11-01 ENCOUNTER — Ambulatory Visit
Admission: RE | Admit: 2021-11-01 | Discharge: 2021-11-01 | Disposition: A | Discharge status: Home / Self Care | Payer: BC Managed Care – PPO | Source: Ambulatory Visit | Attending: Radiation Oncology | Admitting: Radiation Oncology

## 2021-11-01 VITALS — BP 113/71 | HR 119 | Temp 97.1°F | Wt 148.8 lb

## 2021-11-01 DIAGNOSIS — Z923 Personal history of irradiation: Secondary | ICD-10-CM | POA: Diagnosis not present

## 2021-11-01 DIAGNOSIS — I871 Compression of vein: Secondary | ICD-10-CM | POA: Insufficient documentation

## 2021-11-01 DIAGNOSIS — C3411 Malignant neoplasm of upper lobe, right bronchus or lung: Secondary | ICD-10-CM | POA: Insufficient documentation

## 2021-11-01 DIAGNOSIS — C7951 Secondary malignant neoplasm of bone: Secondary | ICD-10-CM | POA: Diagnosis not present

## 2021-11-01 DIAGNOSIS — C3490 Malignant neoplasm of unspecified part of unspecified bronchus or lung: Secondary | ICD-10-CM

## 2021-11-01 NOTE — Progress Notes (Signed)
Radiation Oncology Follow up Note old patient new area pelvic mets  Name: Brittney Fisher   Date:   11/01/2021 MRN:  [id number] DOB: 12/02/1961    This 60 y.o. 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  REFERRING PROVIDER: No ref. provider found  HPI: Patient is a 60 year old female well-known to our department.  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  She also recently received earlier this year palliative treatment to the team to vertebral body.  Her most recent work-up shows.  Disease involving the left femoral neck and trochanter left sacroiliac region and left ischium.  She also has a focus in the proximal tibia on the left just a small lesion in the right distal shaft of the right femur.  She overall says she is in very little pain although does occasionally have some slight difficulty ambulating.  She is seen today for consideration of palliative radiation therapy to her pelvis.  COMPLICATIONS OF TREATMENT: none  FOLLOW UP COMPLIANCE: keeps appointments   PHYSICAL EXAM:  BP 113/71   Pulse (!) 119   Temp (!) 97.1 F (36.2 C) (Tympanic)   Wt 148 lb 12.8 oz (67.5 kg)   BMI 20.75 kg/m  There is some slight guarding on examination of her motor strength in the left lower extremity.  Range of motion does not elicit pain.  Sensory level appears intact.  Well-developed well-nourished patient in NAD. HEENT reveals PERLA, EOMI, discs not visualized.  Oral cavity is clear. No oral mucosal lesions are identified. Neck is clear without evidence of cervical or supraclavicular adenopathy. Lungs are clear to A&P.  Cardiac examination is essentially unremarkable with regular rate and rhythm without murmur rub or thrill. Abdomen is benign with no organomegaly or masses noted. Motor sensory and DTR levels are equal and symmetric in the upper and lower extremities. Cranial nerves II through XII are grossly intact. Proprioception is intact. No peripheral adenopathy or edema is identified. No motor or sensory levels are noted. Crude visual fields are within normal range.  RADIOLOGY RESULTS: CT scans and bone scan reviewed compatible with above-stated findings  PLAN: At this time elect incorporate as much disease in her left pelvis left SI joint and left proximal femur as possible.  I will plan on delivering 30 Gray in 10 fractions.  Risks and benefits of treatment including possible skin reaction fatigue slight chance of diarrhea all were described in detail to the patient.  She seems I personally set up and ordered CT C elation for early next week.  I would like to take this opportunity to thank you for allowing me to participate in the care of your patient.Noreene Filbert, MD

## 2021-11-06 ENCOUNTER — Ambulatory Visit
Admission: RE | Admit: 2021-11-06 | Discharge: 2021-11-06 | Disposition: A | Discharge status: Home / Self Care | Payer: BC Managed Care – PPO | Source: Ambulatory Visit | Attending: Radiation Oncology | Admitting: Radiation Oncology

## 2021-11-06 DIAGNOSIS — C3411 Malignant neoplasm of upper lobe, right bronchus or lung: Secondary | ICD-10-CM | POA: Insufficient documentation

## 2021-11-06 DIAGNOSIS — C7951 Secondary malignant neoplasm of bone: Secondary | ICD-10-CM | POA: Diagnosis not present

## 2021-11-06 DIAGNOSIS — Z87891 Personal history of nicotine dependence: Secondary | ICD-10-CM | POA: Diagnosis not present

## 2021-11-07 ENCOUNTER — Other Ambulatory Visit (HOSPITAL_COMMUNITY): Payer: Self-pay

## 2021-11-07 ENCOUNTER — Other Ambulatory Visit: Payer: Self-pay | Admitting: Oncology

## 2021-11-07 DIAGNOSIS — C7951 Secondary malignant neoplasm of bone: Secondary | ICD-10-CM | POA: Diagnosis not present

## 2021-11-07 DIAGNOSIS — C3490 Malignant neoplasm of unspecified part of unspecified bronchus or lung: Secondary | ICD-10-CM

## 2021-11-07 MED ORDER — LUMAKRAS 120 MG PO TABS
960.0000 mg | ORAL_TABLET | Freq: Every day | ORAL | 0 refills | Status: DC
  Filled 2021-11-07: qty 240, 30d supply, fill #0

## 2021-11-08 ENCOUNTER — Other Ambulatory Visit: Payer: Self-pay | Admitting: Oncology

## 2021-11-08 ENCOUNTER — Other Ambulatory Visit (HOSPITAL_COMMUNITY): Payer: Self-pay

## 2021-11-08 MED ORDER — OXYCODONE HCL 5 MG PO TABS: 5.0000 mg | ORAL_TABLET | Freq: Four times a day (QID) | ORAL | 0 refills | Status: DC | PRN

## 2021-11-13 ENCOUNTER — Ambulatory Visit: Payer: BC Managed Care – PPO

## 2021-11-13 ENCOUNTER — Other Ambulatory Visit (HOSPITAL_COMMUNITY): Payer: Self-pay

## 2021-11-14 ENCOUNTER — Ambulatory Visit
Admission: RE | Admit: 2021-11-14 | Discharge: 2021-11-14 | Disposition: A | Discharge status: Home / Self Care | Payer: BC Managed Care – PPO | Source: Ambulatory Visit | Attending: Radiation Oncology | Admitting: Radiation Oncology

## 2021-11-14 DIAGNOSIS — C7951 Secondary malignant neoplasm of bone: Secondary | ICD-10-CM | POA: Diagnosis not present

## 2021-11-15 ENCOUNTER — Ambulatory Visit
Admission: RE | Admit: 2021-11-15 | Discharge: 2021-11-15 | Disposition: A | Discharge status: Home / Self Care | Payer: BC Managed Care – PPO | Source: Ambulatory Visit | Attending: Radiation Oncology | Admitting: Radiation Oncology

## 2021-11-15 DIAGNOSIS — C7951 Secondary malignant neoplasm of bone: Secondary | ICD-10-CM | POA: Diagnosis not present

## 2021-11-16 ENCOUNTER — Ambulatory Visit
Admission: RE | Admit: 2021-11-16 | Discharge: 2021-11-16 | Disposition: A | Discharge status: Home / Self Care | Payer: BC Managed Care – PPO | Source: Ambulatory Visit | Attending: Radiation Oncology | Admitting: Radiation Oncology

## 2021-11-16 DIAGNOSIS — C7951 Secondary malignant neoplasm of bone: Secondary | ICD-10-CM | POA: Insufficient documentation

## 2021-11-16 DIAGNOSIS — Z79899 Other long term (current) drug therapy: Secondary | ICD-10-CM | POA: Diagnosis not present

## 2021-11-16 DIAGNOSIS — J910 Malignant pleural effusion: Secondary | ICD-10-CM | POA: Insufficient documentation

## 2021-11-16 DIAGNOSIS — C782 Secondary malignant neoplasm of pleura: Secondary | ICD-10-CM | POA: Insufficient documentation

## 2021-11-16 DIAGNOSIS — R000 Tachycardia, unspecified: Secondary | ICD-10-CM | POA: Insufficient documentation

## 2021-11-16 DIAGNOSIS — G893 Neoplasm related pain (acute) (chronic): Secondary | ICD-10-CM | POA: Insufficient documentation

## 2021-11-16 DIAGNOSIS — C3411 Malignant neoplasm of upper lobe, right bronchus or lung: Secondary | ICD-10-CM | POA: Diagnosis present

## 2021-11-16 DIAGNOSIS — R635 Abnormal weight gain: Secondary | ICD-10-CM | POA: Insufficient documentation

## 2021-11-16 DIAGNOSIS — C3490 Malignant neoplasm of unspecified part of unspecified bronchus or lung: Secondary | ICD-10-CM | POA: Insufficient documentation

## 2021-11-16 DIAGNOSIS — Z87891 Personal history of nicotine dependence: Secondary | ICD-10-CM | POA: Insufficient documentation

## 2021-11-17 ENCOUNTER — Ambulatory Visit
Admission: RE | Admit: 2021-11-17 | Discharge: 2021-11-17 | Disposition: A | Discharge status: Home / Self Care | Payer: BC Managed Care – PPO | Source: Ambulatory Visit | Attending: Radiation Oncology | Admitting: Radiation Oncology

## 2021-11-17 DIAGNOSIS — C7951 Secondary malignant neoplasm of bone: Secondary | ICD-10-CM | POA: Diagnosis not present

## 2021-11-20 ENCOUNTER — Ambulatory Visit
Admission: RE | Admit: 2021-11-20 | Discharge: 2021-11-20 | Disposition: A | Discharge status: Home / Self Care | Payer: BC Managed Care – PPO | Source: Ambulatory Visit | Attending: Radiation Oncology | Admitting: Radiation Oncology

## 2021-11-20 ENCOUNTER — Ambulatory Visit: Payer: BC Managed Care – PPO | Admitting: Oncology

## 2021-11-20 ENCOUNTER — Ambulatory Visit: Payer: BC Managed Care – PPO | Admitting: Pharmacist

## 2021-11-20 ENCOUNTER — Other Ambulatory Visit: Payer: BC Managed Care – PPO

## 2021-11-20 DIAGNOSIS — C7951 Secondary malignant neoplasm of bone: Secondary | ICD-10-CM | POA: Diagnosis not present

## 2021-11-20 NOTE — Progress Notes (Signed)
[place]  Telephone:([phone] [phone]  Patient Care Team: Pcp, No as PCP - General Telford Nab, RN as Oncology Nurse Navigator [name], MD as Medical Oncologist (Oncology)   Name of the patient: Brittney Fisher  [id number]  [birth year]   Date of visit: 11/21/21  HPI: Patient is a 60 y.o. female with progressive NSCLC, positive for KRAS G12C mutation. Previously treated with carboplatin/pemetrexed/bevacizumab, she will now transition to treatment with Lumakras (sotorasib). Treatment started 10/20/2021.  Reason for Consult: Oral chemotherapy follow-up for sotorasib therapy.   PAST MEDICAL HISTORY: Past Medical History:  Diagnosis Date   COPD (chronic obstructive pulmonary disease) (HCC)    GERD (gastroesophageal reflux disease)    Metastatic lung cancer (metastasis from lung to other site) ([place]) 11/02/2020    HEMATOLOGY/ONCOLOGY HISTORY:  Oncology History  Metastatic lung cancer (metastasis from lung to other site) [facility])  11/02/2020 Initial Diagnosis   Metastatic lung cancer (metastasis from lung to other site) [facility])   11/18/2020 -  Chemotherapy   Patient is on Treatment Plan : LUNG PEMEtrexed (Alimta) + CARBOplatin + Bevacizumab q21d x 1 cycle      Primary malignant neoplasm of lung metastatic to other site [facility])  11/04/2020 Initial Diagnosis   Primary malignant neoplasm of lung metastatic to other site [facility])   11/04/2020 Cancer Staging   Staging form: Lung, AJCC 8th Edition - Clinical stage from 11/04/2020: Stage IV (cT4, cN3, cM1) - Signed by [name], MD on 11/04/2020      ALLERGIES:  is allergic to carboplatin.  MEDICATIONS:  Current Outpatient Medications  Medication Sig Dispense Refill   acetaminophen (TYLENOL) 500 MG tablet Take 1,000 mg by mouth every 8 (eight) hours as needed for moderate pain.      benzonatate (TESSALON) 100 MG capsule Take 1 capsule (100 mg total) by mouth 3 (three) times  daily as needed for cough. 60 capsule 1   calcium carbonate (TUMS - DOSED IN MG ELEMENTAL CALCIUM) 500 MG chewable tablet Chew 1 tablet by mouth as needed.     folic acid (FOLVITE) 1 MG tablet Take 1 tablet (1 mg total) by mouth daily. Start 5-7 days before Alimta chemotherapy. Continue until 21 days after Alimta completed. 100 tablet 3   guaiFENesin (MUCINEX) 600 MG 12 hr tablet Take 1 tablet (600 mg total) by mouth 2 (two) times daily. 14 tablet 0   ipratropium-albuterol (DUONEB) 0.5-2.5 (3) MG/3ML SOLN Take 3 mLs by nebulization 3 (three) times daily. 360 mL 1   magic mouthwash w/lidocaine SOLN Take 5 mLs by mouth 4 (four) times daily as needed for mouth pain. Sig: Swish/Swallow 5-10 ml four times a day as needed. Dispense 480 ml. 1RF 480 mL 1   ondansetron (ZOFRAN) 8 MG tablet Take 1 tablet (8 mg total) by mouth 2 (two) times daily as needed for refractory nausea / vomiting. Start on day 3 after chemo. 30 tablet 1   oxyCODONE (OXY IR/ROXICODONE) 5 MG immediate release tablet Take 1 tablet (5 mg total) by mouth every 6 (six) hours as needed for moderate pain or severe pain. 120 tablet 0   Polyethylene Glycol 3350 (MIRALAX PO) Take by mouth daily as needed.     prochlorperazine (COMPAZINE) 10 MG tablet Take 1 tablet (10 mg total) by mouth every 6 (six) hours as needed (Nausea or vomiting). 30 tablet 1   protein supplement shake (PREMIER PROTEIN) LIQD Take 2 oz by mouth 4 (four) times daily.  senna-docusate (SENOKOT-S) 8.6-50 MG tablet Take 2 tablets by mouth daily. (Patient not taking: Reported on 10/26/2021) 60 tablet 3   sotorasib (LUMAKRAS) 120 MG TABS Take 960 mg by mouth daily. 240 tablet 0   sucralfate (CARAFATE) 1 GM/10ML suspension Take 10 mLs (1 g total) by mouth in the morning, at noon, and at bedtime. (Patient not taking: Reported on 09/29/2021) 420 mL 0   tiotropium (SPIRIVA HANDIHALER) 18 MCG inhalation capsule Place 18 mcg into inhaler and inhale daily. (Patient not taking: Reported  on 08/18/2021)     No current facility-administered medications for this visit.    VITAL SIGNS: There were no vitals taken for this visit. There were no vitals filed for this visit.  Estimated body mass index is 21.52 kg/m as calculated from the following:   Height as of 10/26/21: '5\' 11"'  (1.803 m).   Weight as of an earlier encounter on 11/21/21: 70 kg (154 lb 4.8 oz).  LABS: CBC:    Component Value Date/Time   WBC 4.6 11/21/2021 1311   HGB 12.3 11/21/2021 1311   HCT 39.8 11/21/2021 1311   PLT 194 11/21/2021 1311   MCV 86.1 11/21/2021 1311   NEUTROABS 3.5 11/21/2021 1311   LYMPHSABS 0.6 (L) 11/21/2021 1311   MONOABS 0.3 11/21/2021 1311   EOSABS 0.1 11/21/2021 1311   BASOSABS 0.0 11/21/2021 1311   Comprehensive Metabolic Panel:    Component Value Date/Time   NA 137 11/21/2021 1311   K 3.8 11/21/2021 1311   CL 99 11/21/2021 1311   CO2 27 11/21/2021 1311   BUN 12 11/21/2021 1311   CREATININE 0.61 11/21/2021 1311   GLUCOSE 106 (H) 11/21/2021 1311   CALCIUM 9.0 11/21/2021 1311   AST 31 11/21/2021 1311   ALT 31 11/21/2021 1311   ALKPHOS 90 11/21/2021 1311   BILITOT 0.5 11/21/2021 1311   PROT 7.7 11/21/2021 1311   ALBUMIN 3.1 (L) 11/21/2021 1311     Present during today's visit: patient and her niece Brittney Fisher  Assessment and Plan: CMP/CBC reviewed, continue sotorasib 975m daily   Oral Chemotherapy Side Effect/Intolerance:  No reported diarrhea, nausea, fatigue, and her cough has improved Leg and pelvic pain is improving with radiation treatments  Oral Chemotherapy Adherence: no missed doses reported No patient barriers to medication adherence identified.   New medications: None reported  Medication Access Issues: no issues[place] [facility] that they received a letter stating that in the new year her insurance will require that she uses [facility] Will help with that transition when that time comes.  Patient  expressed understanding and was in agreement with this plan. She also understands that She can call clinic at any time with any questions, concerns, or complaints.   Follow-up plan: RTC in 3 weeks  Thank you for allowing me to participate in the care of this very pleasant patient.   Time Total: 15 mins  Visit consisted of counseling and education on dealing with issues of symptom management in the setting of serious and potentially life-threatening illness.Greater than 50%  of this time was spent counseling and coordinating care related to the above assessment and plan.  Signed by: ADarl Pikes PharmD, BCPS, BSalley Slaughter CPP Hematology/Oncology Clinical Pharmacist Practitioner [phone] 11/21/2021 4:28 PM

## 2021-11-21 ENCOUNTER — Inpatient Hospital Stay (HOSPITAL_BASED_OUTPATIENT_CLINIC_OR_DEPARTMENT_OTHER): Payer: BC Managed Care – PPO | Admitting: Oncology

## 2021-11-21 ENCOUNTER — Other Ambulatory Visit: Payer: Self-pay

## 2021-11-21 ENCOUNTER — Inpatient Hospital Stay: Payer: BC Managed Care – PPO

## 2021-11-21 ENCOUNTER — Inpatient Hospital Stay: Payer: BC Managed Care – PPO | Admitting: Pharmacist

## 2021-11-21 ENCOUNTER — Encounter: Payer: Self-pay | Admitting: Oncology

## 2021-11-21 ENCOUNTER — Ambulatory Visit
Admission: RE | Admit: 2021-11-21 | Discharge: 2021-11-21 | Disposition: A | Discharge status: Home / Self Care | Payer: BC Managed Care – PPO | Source: Ambulatory Visit | Attending: Radiation Oncology | Admitting: Radiation Oncology

## 2021-11-21 VITALS — BP 125/77 | HR 114 | Temp 98.4°F | Resp 18 | Wt 154.3 lb

## 2021-11-21 DIAGNOSIS — C3490 Malignant neoplasm of unspecified part of unspecified bronchus or lung: Secondary | ICD-10-CM

## 2021-11-21 DIAGNOSIS — C782 Secondary malignant neoplasm of pleura: Secondary | ICD-10-CM | POA: Insufficient documentation

## 2021-11-21 DIAGNOSIS — C7951 Secondary malignant neoplasm of bone: Secondary | ICD-10-CM | POA: Diagnosis not present

## 2021-11-21 DIAGNOSIS — R000 Tachycardia, unspecified: Secondary | ICD-10-CM | POA: Insufficient documentation

## 2021-11-21 DIAGNOSIS — J910 Malignant pleural effusion: Secondary | ICD-10-CM | POA: Insufficient documentation

## 2021-11-21 DIAGNOSIS — Z87891 Personal history of nicotine dependence: Secondary | ICD-10-CM | POA: Insufficient documentation

## 2021-11-21 DIAGNOSIS — Z5111 Encounter for antineoplastic chemotherapy: Secondary | ICD-10-CM

## 2021-11-21 DIAGNOSIS — Z79899 Other long term (current) drug therapy: Secondary | ICD-10-CM | POA: Insufficient documentation

## 2021-11-21 DIAGNOSIS — R635 Abnormal weight gain: Secondary | ICD-10-CM | POA: Insufficient documentation

## 2021-11-21 DIAGNOSIS — C3411 Malignant neoplasm of upper lobe, right bronchus or lung: Secondary | ICD-10-CM | POA: Insufficient documentation

## 2021-11-21 DIAGNOSIS — G893 Neoplasm related pain (acute) (chronic): Secondary | ICD-10-CM | POA: Insufficient documentation

## 2021-11-21 LAB — CBC WITH DIFFERENTIAL/PLATELET
Abs Immature Granulocytes: 0.03 10*3/uL (ref 0.00–0.07)
Basophils Absolute: 0 10*3/uL (ref 0.0–0.1)
Basophils Relative: 0 %
Eosinophils Absolute: 0.1 10*3/uL (ref 0.0–0.5)
Eosinophils Relative: 2 %
HCT: 39.8 % (ref 36.0–46.0)
Hemoglobin: 12.3 g/dL (ref 12.0–15.0)
Immature Granulocytes: 1 %
Lymphocytes Relative: 13 %
Lymphs Abs: 0.6 10*3/uL — ABNORMAL LOW (ref 0.7–4.0)
MCH: 26.6 pg (ref 26.0–34.0)
MCHC: 30.9 g/dL (ref 30.0–36.0)
MCV: 86.1 fL (ref 80.0–100.0)
Monocytes Absolute: 0.3 10*3/uL (ref 0.1–1.0)
Monocytes Relative: 7 %
Neutro Abs: 3.5 10*3/uL (ref 1.7–7.7)
Neutrophils Relative %: 77 %
Platelets: 194 10*3/uL (ref 150–400)
RBC: 4.62 MIL/uL (ref 3.87–5.11)
RDW: 17.2 % — ABNORMAL HIGH (ref 11.5–15.5)
WBC: 4.6 10*3/uL (ref 4.0–10.5)
nRBC: 0 % (ref 0.0–0.2)

## 2021-11-21 LAB — COMPREHENSIVE METABOLIC PANEL
ALT: 31 U/L (ref 0–44)
AST: 31 U/L (ref 15–41)
Albumin: 3.1 g/dL — ABNORMAL LOW (ref 3.5–5.0)
Alkaline Phosphatase: 90 U/L (ref 38–126)
Anion gap: 11 (ref 5–15)
BUN: 12 mg/dL (ref 8–23)
CO2: 27 mmol/L (ref 22–32)
Calcium: 9 mg/dL (ref 8.9–10.3)
Chloride: 99 mmol/L (ref 98–111)
Creatinine, Ser: 0.61 mg/dL (ref 0.44–1.00)
GFR, Estimated: >60 mL/min (ref >60)
Glucose, Bld: 106 mg/dL — ABNORMAL HIGH (ref 70–99)
Potassium: 3.8 mmol/L (ref 3.5–5.1)
Sodium: 137 mmol/L (ref 135–145)
Total Bilirubin: 0.5 mg/dL (ref 0.3–1.2)
Total Protein: 7.7 g/dL (ref 6.5–8.1)

## 2021-11-21 LAB — T4, FREE: Free T4: 0.88 ng/dL (ref 0.61–1.12)

## 2021-11-21 LAB — TSH: TSH: 0.569 u[IU]/mL (ref 0.350–4.500)

## 2021-11-21 NOTE — Progress Notes (Signed)
Pt here for follow up. No new concerns voiced.   

## 2021-11-21 NOTE — Progress Notes (Signed)
Hematology/Oncology Follow Up Note [phone] [phone]  Patient Care Team: Pcp, No as PCP - General Telford Nab, RN as Oncology Nurse Navigator [name], MD as Medical Oncologist (Oncology)   Name of the patient: Brittney Fisher  [id number]  05-Sep-1961   REASON FOR VISIT follow-up for lung cancer  PERTINENT ONCOLOGY HISTORY 60 y.o. female with past medical history including GERD, COPD, current everyday smoker presents for follow-up of lung mass and pleural effusion. 10/20/2020, patient was brought to ED via EMS due to generalized weakness, dizziness, chest pain shortness of breath.  She was recently diagnosed with COPD approximately 1 month ago by primary care provider.  Also unintentional weight loss during the last few months. Image work-up showed right-sided pleural effusion.  She underwent right thoracentesis.  With removal of 2.4 L of fluid. 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  Metastatic disease to low cervical/thoracic nodes, lung, right pleural space and bones.  Moderate to large right pleural effusion.  SVC narrowing. 10/21/2020 MRI brain is negative for metastasis.  Mild chronic microvascular ischemic changes in the white matter.  Negative for acute infarct Regarding to the SVC narrowing, patient was seen by vascular surgeon and was recommended no intervention inpatient.  Patient to follow-up outpatient with vascular surgeon for evaluation. Patient also was treated for COPD exacerbation with hypoxia on exertion. Qualifies for home oxygen and hospitalist arrange home health and a nebulizer.  Patient was given a course of prednisone taper and empiric Levaquin. Patient was discharged and present today to follow-up with cytology results and further management plan  10/27/2020 PET scan showed right upper lobe primary bronchogenic mass with direct invasion into  mediastinum. Metastatic disease to the right pleural space, lung, bone, nodes of the chest and less so lower leg/upper abdomen. Hypermetabolic him along the course of the right axillary vein with concurrent subtle hyper attenuation within the axillary vein and SVC.  This continues to the level of SVC narrowing.  Suspicious for SVC occlusion and developing thrombus. Moderate right pleural effusion and small pericardial effusion.  # # left supraclavicular mass biopsy- pathology is positive for adenocarcinoma. positive for CK7, with patchy, weak CK20.  They are  negative for TTF1, NapsinA, GATA3, CDX2, and Pax8.  The findings are  nonspecific; but may be compatible with a poorly differentiated  adenocarcinoma of lung origin, especially given the imaging findings  She also had another repeat thoracentesis and fluid cytology showed malignancy.  #NGS showed AXIN 1 [id number], DIS2 M16f, KRAS G12C, PRDM1 M1 [id number], [id number] MS stable, TPS <1%   11/14/2020- 12/12/2020.  #Bronchial obstruction and SVC occlusion.  s/p palliative radiation #After 2 cycles of chemtherapy, patient had CT scan done due to shortness of breath.  01/05/2021, CT chest angiogram PE protocol showed no evidence of pulmonary embolism.  Stable disease.  Right upper lobe mass about 6.7 x 5.3 cm.  Widespread metastatic disease, lung and bone involvement. Interval development of right-sided hydronephrosis. 03/06/2021 finished T2 palliative radiation 03/22/2021, 6 cycles of carboplatin/Alimta/bevacizumab.  Infusion reaction of carboplatin during cycle 6.  Carboplatin discontinued. 04/07/2021 CT chest abdomen pelvis-partial response Mild decrease in size of right lung mass, mildly decreased mediastinal and hilar adenopathy.  Extensive right-sided pleural metastasis unchanged.  Similar appearance of diffuse bilateral pulmonary nodules.  Multifocal lytic sclerotic bone metastasis.  New superior endplate deformity of T6 04/12/2021 -  09/08/2021, patient was continued on bevacizumab and Alimta.  09/21/2021, bone scan  showed multifocal abnormal uptake including mid to distal shaft of right femur, left femoral neck and trochanter, left sacroiliac region, left ischium.  Focal activity at the proximal tibia on the left.  Multi focal heterogeneous bilateral rib activity.  Focal activity at the sternum. 09/27/2021, CT chest abdomen pelvis with contrast showed progression of infrahilar left lower lobe with a concerning clearly progressive masslike consolidation opacity.  Mild progression of small left pleural effusion.  Similar experience of extensive irregular right-sided pleural disease.  Similar experience of bony metastasis.  09/29/2021 extra cycle of maintenance bevacizumab and Alimta while waiting for approval of Lumakras  10/20/2021 started on Dysart Brittney Fisher is a 60 y.o. female who has above history reviewed by me today presents for follow up visit for management of metastatic lung cancer, acute visit for swallowing pain.  Problems and complaints are listed below Patient was accompanied by her daughter. Patient reports feeling well today. Chronic shortness of breath with exertion at baseline.  Not worse. Her appetite has improved and she has gained 154 pounds since last visit. She follows up with nutritionist. Patient is currently on right lower extremity palliative radiation.   Review of Systems  Constitutional:  Positive for unexpected weight change. Negative for chills, fatigue and fever.  HENT:   Negative for hearing loss and voice change.   Eyes:  Negative for eye problems.  Respiratory:  Negative for chest tightness, cough and shortness of breath.   Cardiovascular:  Negative for chest pain.  Gastrointestinal:  Negative for abdominal distention, abdominal pain and blood in stool.  Endocrine: Negative for hot flashes.  Genitourinary:  Negative for difficulty urinating and frequency.    Musculoskeletal:  Negative for arthralgias.       Right lower extremity pain  Skin:  Negative for itching and rash.  Neurological:  Negative for extremity weakness.  Hematological:  Negative for adenopathy.  Psychiatric/Behavioral:  Negative for confusion.      Allergies  Allergen Reactions   Carboplatin Shortness Of Breath, Itching and Cough     Past Medical History:  Diagnosis Date   COPD (chronic obstructive pulmonary disease) (HCC)    GERD (gastroesophageal reflux disease)    Metastatic lung cancer (metastasis from lung to other site) ([facility]) 11/02/2020     Past Surgical History:  Procedure Laterality Date   COLONOSCOPY WITH ESOPHAGOGASTRODUODENOSCOPY (EGD)     COLONOSCOPY WITH PROPOFOL N/A 11/14/2015   Procedure: COLONOSCOPY WITH PROPOFOL;  Surgeon: Hulen Luster, MD;  Location: [facility] ENDOSCOPY;  Service: Gastroenterology;  Laterality: N/A;   ESOPHAGOGASTRODUODENOSCOPY (EGD) WITH PROPOFOL N/A 11/14/2015   Procedure: ESOPHAGOGASTRODUODENOSCOPY (EGD) WITH PROPOFOL;  Surgeon: Hulen Luster, MD;  Location: [facility] ENDOSCOPY;  Service: Gastroenterology;  Laterality: N/A;    Social History   Socioeconomic History   Marital status: Married    Spouse name: Not on file   Number of children: Not on file   Years of education: Not on file   Highest education level: Not on file  Occupational History   Not on file  Tobacco Use   Smoking status: Former    Types: Cigarettes    Quit date: 09/2021    Years since quitting: 0.1   Smokeless tobacco: Never  Vaping Use   Vaping Use: Never used  Substance and Sexual Activity   Alcohol use: Never   Drug use: Never   Sexual activity: Not Currently  Other Topics Concern   Not on file  Social History Narrative   Not  on file   Social Determinants of Health   Financial Resource Strain: Not on file  Food Insecurity: Not on file  Transportation Needs: Not on file  Physical Activity: Not on file  Stress: Not on file  Social Connections: Not  on file  Intimate Partner Violence: Not on file    Family History  Problem Relation Age of Onset   Breast cancer Cousin 29   Diabetes type II Mother    Hypertension Mother    Colon cancer Father    Hypertension Father      Current Outpatient Medications:    acetaminophen (TYLENOL) 500 MG tablet, Take 1,000 mg by mouth every 8 (eight) hours as needed for moderate pain. , Disp: , Rfl:    benzonatate (TESSALON) 100 MG capsule, Take 1 capsule (100 mg total) by mouth 3 (three) times daily as needed for cough., Disp: 60 capsule, Rfl: 1   calcium carbonate (TUMS - DOSED IN MG ELEMENTAL CALCIUM) 500 MG chewable tablet, Chew 1 tablet by mouth as needed., Disp: , Rfl:    folic acid (FOLVITE) 1 MG tablet, Take 1 tablet (1 mg total) by mouth daily. Start 5-7 days before Alimta chemotherapy. Continue until 21 days after Alimta completed., Disp: 100 tablet, Rfl: 3   guaiFENesin (MUCINEX) 600 MG 12 hr tablet, Take 1 tablet (600 mg total) by mouth 2 (two) times daily., Disp: 14 tablet, Rfl: 0   ipratropium-albuterol (DUONEB) 0.5-2.5 (3) MG/3ML SOLN, Take 3 mLs by nebulization 3 (three) times daily., Disp: 360 mL, Rfl: 1   magic mouthwash w/lidocaine SOLN, Take 5 mLs by mouth 4 (four) times daily as needed for mouth pain. Sig: Swish/Swallow 5-10 ml four times a day as needed. Dispense 480 ml. 1RF, Disp: 480 mL, Rfl: 1   ondansetron (ZOFRAN) 8 MG tablet, Take 1 tablet (8 mg total) by mouth 2 (two) times daily as needed for refractory nausea / vomiting. Start on day 3 after chemo., Disp: 30 tablet, Rfl: 1   oxyCODONE (OXY IR/ROXICODONE) 5 MG immediate release tablet, Take 1 tablet (5 mg total) by mouth every 6 (six) hours as needed for moderate pain or severe pain., Disp: 120 tablet, Rfl: 0   Polyethylene Glycol 3350 (MIRALAX PO), Take by mouth daily as needed., Disp: , Rfl:    prochlorperazine (COMPAZINE) 10 MG tablet, Take 1 tablet (10 mg total) by mouth every 6 (six) hours as needed (Nausea or vomiting).,  Disp: 30 tablet, Rfl: 1   protein supplement shake (PREMIER PROTEIN) LIQD, Take 2 oz by mouth 4 (four) times daily., Disp: , Rfl:    sotorasib (LUMAKRAS) 120 MG TABS, Take 960 mg by mouth daily., Disp: 240 tablet, Rfl: 0   senna-docusate (SENOKOT-S) 8.6-50 MG tablet, Take 2 tablets by mouth daily. (Patient not taking: Reported on 10/26/2021), Disp: 60 tablet, Rfl: 3   sucralfate (CARAFATE) 1 GM/10ML suspension, Take 10 mLs (1 g total) by mouth in the morning, at noon, and at bedtime. (Patient not taking: Reported on 09/29/2021), Disp: 420 mL, Rfl: 0   tiotropium (SPIRIVA HANDIHALER) 18 MCG inhalation capsule, Place 18 mcg into inhaler and inhale daily. (Patient not taking: Reported on 08/18/2021), Disp: , Rfl:   Physical exam: ECOG 1 Vitals:   11/21/21 1331  BP: 125/77  Pulse: (!) 114  Resp: 18  Temp: 98.4 F (36.9 C)  SpO2: 97%  Weight: 154 lb 4.8 oz (70 kg)   Physical Exam Constitutional:      General: She is not in acute distress.  Comments: Thin built female walks independently  HENT:     Head: Normocephalic and atraumatic.  Eyes:     General: No scleral icterus. Cardiovascular:     Rate and Rhythm: Normal rate and regular rhythm.     Heart sounds: Normal heart sounds.  Pulmonary:     Effort: Pulmonary effort is normal. No respiratory distress.     Breath sounds: No wheezing.     Comments: Slightly decreased breath sound bilaterally.  Abdominal:     General: Bowel sounds are normal. There is no distension.     Palpations: Abdomen is soft.  Musculoskeletal:        General: No deformity. Normal range of motion.     Cervical back: Normal range of motion and neck supple.  Skin:    General: Skin is warm and dry.     Findings: No erythema or rash.  Neurological:     Mental Status: She is alert and oriented to person, place, and time. Mental status is at baseline.     Cranial Nerves: No cranial nerve deficit.     Coordination: Coordination normal.  Psychiatric:        Mood  and Affect: Mood normal.    CMP Latest Ref Rng & Units 11/21/2021  Glucose 70 - 99 mg/dL 106(H)  BUN 8 - 23 mg/dL 12  Creatinine 0.44 - 1.00 mg/dL 0.61  Sodium 135 - 145 mmol/L 137  Potassium 3.5 - 5.1 mmol/L 3.8  Chloride 98 - 111 mmol/L 99  CO2 22 - 32 mmol/L 27  Calcium 8.9 - 10.3 mg/dL 9.0  Total Protein 6.5 - 8.1 g/dL 7.7  Total Bilirubin 0.3 - 1.2 mg/dL 0.5  Alkaline Phos 38 - 126 U/L 90  AST 15 - 41 U/L 31  ALT 0 - 44 U/L 31   CBC Latest Ref Rng & Units 11/21/2021  WBC 4.0 - 10.5 K/uL 4.6  Hemoglobin 12.0 - 15.0 g/dL 12.3  Hematocrit 36.0 - 46.0 % 39.8  Platelets 150 - 400 K/uL 194    RADIOGRAPHIC STUDIES: I have personally reviewed the radiological images as listed and agreed with the findings in the report. NM Bone Scan Whole Body  Result Date: 09/24/2021 CLINICAL DATA:  Lung cancer bone Mets EXAM: NUCLEAR MEDICINE WHOLE BODY BONE SCAN TECHNIQUE: Whole body anterior and posterior images were obtained approximately 3 hours after intravenous injection of radiopharmaceutical. RADIOPHARMACEUTICALS:  20.32 mCi Technetium-74mMDP IV COMPARISON:  CT 07/04/2021, PET CT 10/27/2020 FINDINGS: Multifocal abnormal uptake visualized within the mid to distal shaft of right femur, left femoral neck and trochanter, left sacroiliac region, and left ischium. Focus of activity at the proximal tibia on the left. Multifocal heterogeneous bilateral rib activity. Focal activity at the sternum. Mild asymmetrical activity at the right [facility] and shoulder potentially degenerative. Excreted activity into the kidneys and bladder with possible mild right hydronephrosis. IMPRESSION: 1. Multifocal abnormal uptake as described above consistent with metastatic disease 2. Excreted activity into right renal collecting system with possible mild hydronephrosis on the right. Electronically Signed   By: KDonavan FoilM.D.   On: 09/24/2021 20:03   CT CHEST ABDOMEN PELVIS W CONTRAST  Result Date:  09/28/2021 CLINICAL DATA:  Lung cancer.  Restaging. EXAM: CT CHEST, ABDOMEN, AND PELVIS WITH CONTRAST TECHNIQUE: Multidetector CT imaging of the chest, abdomen and pelvis was performed following the standard protocol during bolus administration of intravenous contrast. CONTRAST:  1045mOMNIPAQUE IOHEXOL 300 MG/ML  SOLN COMPARISON:  07/04/2021 FINDINGS: CT CHEST FINDINGS  Cardiovascular: The heart size is normal. No substantial pericardial effusion. Mediastinum/Nodes: Amorphous nodal/metastatic tissue in the mediastinum is similar to prior. Precarinal disease measured previously at 18 mm is 17 mm today on 27/2. AP window lymph node measured previously at 13 mm is 11 mm today on 26/2. Lungs/Pleura: Masslike consolidative opacity in the infrahilar left lower lobe appears progressive with 6.7 x 3.8 cm lesion on 47/2 increased from 4.6 x 2.6 cm (remeasured) previously the extensive irregular right-sided pleural disease is similar although reproducible measurement is difficult due to irregular shape. Plaque-like disease along the right heart border on image 37/2 measures 6.5 x 1.8 cm today which compares to 6.6 x 2.0 cm when remeasured in a similar fashion on the prior study. 13 mm left lower lobe nodule on 48/2 was 8 mm previously (remeasured). And while 13 mm right lower lobe nodule on 48/2 was 8 mm previously (remeasured). 13 mm right lower lobe nodule on 106/3 was 12 mm previously. Interval progression of small left pleural effusion. Musculoskeletal: Lytic T2 lesion is similar to prior. CT ABDOMEN PELVIS FINDINGS Hepatobiliary: No suspicious focal abnormality within the liver parenchyma. There is no evidence for gallstones, gallbladder wall thickening, or pericholecystic fluid. No intrahepatic or extrahepatic biliary dilation. Pancreas: No focal mass lesion. No dilatation of the main duct. No intraparenchymal cyst. No peripancreatic edema. Spleen: No splenomegaly. No focal mass lesion. Adrenals/Urinary Tract: No  adrenal nodule or mass. Bilateral renal cysts again noted. No evidence for hydroureter. Bladder is decompressed. Stomach/Bowel: Stomach is distended with contrast material. Duodenum is normally positioned as is the ligament of Treitz. No small bowel wall thickening. No small bowel dilatation. The terminal ileum is normal. The appendix is normal. No gross colonic mass. No colonic wall thickening. Vascular/Lymphatic: There is advanced atherosclerotic calcification of the abdominal aorta without aneurysm. There is no gastrohepatic or hepatoduodenal ligament lymphadenopathy. No retroperitoneal or mesenteric lymphadenopathy. No pelvic sidewall lymphadenopathy. Reproductive: The uterus is unremarkable.  There is no adnexal mass. Other: No intraperitoneal free fluid. Musculoskeletal: Scattered sclerotic bone lesions again noted, similar to prior. Lucent lesion posterior left iliac bone is stable. IMPRESSION: 1. No generalized trend towards improving or progressive disease. Some of the index mediastinal disease measures stable to minimally smaller today many of the pulmonary nodules appear similar, some have progressed. The most concerning progression is in the infrahilar left lower lobe with a concerning clearly progressive masslike consolidative opacity. 2. Mild progression of small left pleural effusion. 3. Similar appearance of extensive irregular right-sided pleural disease. 4. Similar appearance of bony metastatic involvement. 5. Aortic Atherosclerosis (ICD10-I70.0). Electronically Signed   By: Misty Stanley M.D.   On: 09/28/2021 20:41       Assessment and plan  1. Encounter for antineoplastic chemotherapy   2. Bone metastasis ([facility])   3. Tachycardia   4. 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  cT4 N3 M1-  KRASG12C mutation [Patient has high TMB, TPS less than 1%.  Immunotherapy in combination with chemotherapy will be other options in the future if lumakras is not effective]   Labs reviewed and discussed with patient stable liver function. .  She tolerates Lumakras well,.  Continue. She  has gained weight.  Continue nutrition supplementation.  Bone metastasis, pain has improved.  She is currently on palliative radiation to right lower extremity. Tachycardia, encourage oral hydration.  Normal thyroid function.  EKG was obtained during the clinic and was reviewed by me.  Sinus tachycardia.  Lab MD 3 weeks evaluation of Lumakras tolerability  [name], MD, PhD [facility] Hematology Oncology 11/21/2021

## 2021-11-22 ENCOUNTER — Ambulatory Visit
Admission: RE | Admit: 2021-11-22 | Discharge: 2021-11-22 | Disposition: A | Discharge status: Home / Self Care | Payer: BC Managed Care – PPO | Source: Ambulatory Visit | Attending: Radiation Oncology | Admitting: Radiation Oncology

## 2021-11-22 DIAGNOSIS — C7951 Secondary malignant neoplasm of bone: Secondary | ICD-10-CM | POA: Diagnosis not present

## 2021-11-23 ENCOUNTER — Inpatient Hospital Stay: Payer: BC Managed Care – PPO

## 2021-11-23 ENCOUNTER — Ambulatory Visit
Admission: RE | Admit: 2021-11-23 | Discharge: 2021-11-23 | Disposition: A | Discharge status: Home / Self Care | Payer: BC Managed Care – PPO | Source: Ambulatory Visit | Attending: Radiation Oncology | Admitting: Radiation Oncology

## 2021-11-23 DIAGNOSIS — C7951 Secondary malignant neoplasm of bone: Secondary | ICD-10-CM | POA: Diagnosis not present

## 2021-11-23 NOTE — Progress Notes (Signed)
Nutrition Follow-up:  Patient with metastatic non small cell lung cancer.  Patient taking lumakras, oral chemotherapy.    Spoke with patient via phone for nutrition follow-up.  Patient reports that her appetite is good.  Eating cereal during phone visit and says she is drinking a premier protein shake.  Reports yesterday about to eat sausage biscuit and seafood boil.  Denies nutrition impact symptoms at this time.  Will complete radiation on 12/12.      Medications: reviewed  Labs: reviewed  Anthropometrics:   Weight 154 lb 4.8 oz on 12/6  149 lb on 11/10 147 lb on 11/4 159 lb on 9/23 149 lb on 4/2   NUTRITION DIAGNOSIS: Inadequate oral intake continues   INTERVENTION:  Patient to continue high calorie, high protein foods. Continue premier protein shake at least daily.    MONITORING, EVALUATION, GOAL: weight trends, intake   NEXT VISIT: Thursday, Jan 19 phone call  Jahnasia Tatum B. [name], [place], [place] Registered Dietitian [phone] (mobile)

## 2021-11-24 ENCOUNTER — Ambulatory Visit
Admission: RE | Admit: 2021-11-24 | Discharge: 2021-11-24 | Disposition: A | Discharge status: Home / Self Care | Payer: BC Managed Care – PPO | Source: Ambulatory Visit | Attending: Radiation Oncology | Admitting: Radiation Oncology

## 2021-11-24 DIAGNOSIS — C7951 Secondary malignant neoplasm of bone: Secondary | ICD-10-CM | POA: Diagnosis not present

## 2021-11-27 ENCOUNTER — Ambulatory Visit
Admission: RE | Admit: 2021-11-27 | Discharge: 2021-11-27 | Disposition: A | Discharge status: Home / Self Care | Payer: BC Managed Care – PPO | Source: Ambulatory Visit | Attending: Radiation Oncology | Admitting: Radiation Oncology

## 2021-11-27 DIAGNOSIS — C7951 Secondary malignant neoplasm of bone: Secondary | ICD-10-CM | POA: Diagnosis not present

## 2021-12-07 ENCOUNTER — Other Ambulatory Visit: Payer: Self-pay | Admitting: Nurse Practitioner

## 2021-12-07 ENCOUNTER — Other Ambulatory Visit (HOSPITAL_COMMUNITY): Payer: Self-pay

## 2021-12-07 DIAGNOSIS — C3490 Malignant neoplasm of unspecified part of unspecified bronchus or lung: Secondary | ICD-10-CM

## 2021-12-08 ENCOUNTER — Other Ambulatory Visit (HOSPITAL_COMMUNITY): Payer: Self-pay

## 2021-12-08 ENCOUNTER — Other Ambulatory Visit: Payer: Self-pay | Admitting: Nurse Practitioner

## 2021-12-08 DIAGNOSIS — C3490 Malignant neoplasm of unspecified part of unspecified bronchus or lung: Secondary | ICD-10-CM

## 2021-12-12 ENCOUNTER — Inpatient Hospital Stay: Payer: BC Managed Care – PPO

## 2021-12-12 ENCOUNTER — Other Ambulatory Visit: Payer: Self-pay

## 2021-12-12 ENCOUNTER — Encounter: Payer: Self-pay | Admitting: Oncology

## 2021-12-12 ENCOUNTER — Inpatient Hospital Stay (HOSPITAL_BASED_OUTPATIENT_CLINIC_OR_DEPARTMENT_OTHER): Payer: BC Managed Care – PPO | Admitting: Oncology

## 2021-12-12 VITALS — BP 120/76 | HR 106 | Temp 98.5°F | Resp 16 | Wt 156.0 lb

## 2021-12-12 DIAGNOSIS — C7951 Secondary malignant neoplasm of bone: Secondary | ICD-10-CM | POA: Diagnosis not present

## 2021-12-12 DIAGNOSIS — C3490 Malignant neoplasm of unspecified part of unspecified bronchus or lung: Secondary | ICD-10-CM | POA: Diagnosis not present

## 2021-12-12 DIAGNOSIS — R000 Tachycardia, unspecified: Secondary | ICD-10-CM

## 2021-12-12 DIAGNOSIS — Z5111 Encounter for antineoplastic chemotherapy: Secondary | ICD-10-CM

## 2021-12-12 LAB — COMPREHENSIVE METABOLIC PANEL
ALT: 19 U/L (ref 0–44)
AST: 20 U/L (ref 15–41)
Albumin: 3.1 g/dL — ABNORMAL LOW (ref 3.5–5.0)
Alkaline Phosphatase: 87 U/L (ref 38–126)
Anion gap: 9 (ref 5–15)
BUN: 16 mg/dL (ref 8–23)
CO2: 28 mmol/L (ref 22–32)
Calcium: 8.6 mg/dL — ABNORMAL LOW (ref 8.9–10.3)
Chloride: 99 mmol/L (ref 98–111)
Creatinine, Ser: 0.64 mg/dL (ref 0.44–1.00)
GFR, Estimated: >60 mL/min (ref >60)
Glucose, Bld: 89 mg/dL (ref 70–99)
Potassium: 3.9 mmol/L (ref 3.5–5.1)
Sodium: 136 mmol/L (ref 135–145)
Total Bilirubin: 0.1 mg/dL — ABNORMAL LOW (ref 0.3–1.2)
Total Protein: 7.3 g/dL (ref 6.5–8.1)

## 2021-12-12 LAB — CBC WITH DIFFERENTIAL/PLATELET
Abs Immature Granulocytes: 0.01 10*3/uL (ref 0.00–0.07)
Basophils Absolute: 0 10*3/uL (ref 0.0–0.1)
Basophils Relative: 0 %
Eosinophils Absolute: 0.2 10*3/uL (ref 0.0–0.5)
Eosinophils Relative: 5 %
HCT: 39.2 % (ref 36.0–46.0)
Hemoglobin: 11.9 g/dL — ABNORMAL LOW (ref 12.0–15.0)
Immature Granulocytes: 0 %
Lymphocytes Relative: 11 %
Lymphs Abs: 0.5 10*3/uL — ABNORMAL LOW (ref 0.7–4.0)
MCH: 26 pg (ref 26.0–34.0)
MCHC: 30.4 g/dL (ref 30.0–36.0)
MCV: 85.8 fL (ref 80.0–100.0)
Monocytes Absolute: 0.5 10*3/uL (ref 0.1–1.0)
Monocytes Relative: 11 %
Neutro Abs: 3.4 10*3/uL (ref 1.7–7.7)
Neutrophils Relative %: 73 %
Platelets: 169 10*3/uL (ref 150–400)
RBC: 4.57 MIL/uL (ref 3.87–5.11)
RDW: 16.8 % — ABNORMAL HIGH (ref 11.5–15.5)
WBC: 4.6 10*3/uL (ref 4.0–10.5)
nRBC: 0 % (ref 0.0–0.2)

## 2021-12-12 NOTE — Progress Notes (Signed)
Hematology/Oncology Follow Up Note [phone] [phone]  Patient Care Team: Pcp, No as PCP - General Telford Nab, RN as Oncology Nurse Navigator [name], MD as Medical Oncologist (Oncology)   Name of the patient: Brittney Fisher  [id number]  1961-04-28   REASON FOR VISIT follow-up for lung cancer  PERTINENT ONCOLOGY HISTORY 60 y.o. female with past medical history including GERD, COPD, current everyday smoker presents for follow-up of lung mass and pleural effusion. 10/20/2020, patient was brought to ED via EMS due to generalized weakness, dizziness, chest pain shortness of breath.  She was recently diagnosed with COPD approximately 1 month ago by primary care provider.  Also unintentional weight loss during the last few months. Image work-up showed right-sided pleural effusion.  She underwent right thoracentesis.  With removal of 2.4 L of fluid. 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  Metastatic disease to low cervical/thoracic nodes, lung, right pleural space and bones.  Moderate to large right pleural effusion.  SVC narrowing. 10/21/2020 MRI brain is negative for metastasis.  Mild chronic microvascular ischemic changes in the white matter.  Negative for acute infarct Regarding to the SVC narrowing, patient was seen by vascular surgeon and was recommended no intervention inpatient.  Patient to follow-up outpatient with vascular surgeon for evaluation. Patient also was treated for COPD exacerbation with hypoxia on exertion. Qualifies for home oxygen and hospitalist arrange home health and a nebulizer.  Patient was given a course of prednisone taper and empiric Levaquin. Patient was discharged and present today to follow-up with cytology results and further management plan  10/27/2020 PET scan showed right upper lobe primary bronchogenic mass with direct invasion into  mediastinum. Metastatic disease to the right pleural space, lung, bone, nodes of the chest and less so lower leg/upper abdomen. Hypermetabolic him along the course of the right axillary vein with concurrent subtle hyper attenuation within the axillary vein and SVC.  This continues to the level of SVC narrowing.  Suspicious for SVC occlusion and developing thrombus. Moderate right pleural effusion and small pericardial effusion.  # # left supraclavicular mass biopsy- pathology is positive for adenocarcinoma. positive for CK7, with patchy, weak CK20.  They are  negative for TTF1, NapsinA, GATA3, CDX2, and Pax8.  The findings are  nonspecific; but may be compatible with a poorly differentiated  adenocarcinoma of lung origin, especially given the imaging findings  She also had another repeat thoracentesis and fluid cytology showed malignancy.  #NGS showed AXIN 1 [id number], DIS2 M172f, KRAS G12C, PRDM1 M1 [id number], [id number] MS stable, TPS <1%   11/14/2020- 12/12/2020.  #Bronchial obstruction and SVC occlusion.  s/p palliative radiation #After 2 cycles of chemtherapy, patient had CT scan done due to shortness of breath.  01/05/2021, CT chest angiogram PE protocol showed no evidence of pulmonary embolism.  Stable disease.  Right upper lobe mass about 6.7 x 5.3 cm.  Widespread metastatic disease, lung and bone involvement. Interval development of right-sided hydronephrosis. 03/06/2021 finished T2 palliative radiation 03/22/2021, 6 cycles of carboplatin/Alimta/bevacizumab.  Infusion reaction of carboplatin during cycle 6.  Carboplatin discontinued. 04/07/2021 CT chest abdomen pelvis-partial response Mild decrease in size of right lung mass, mildly decreased mediastinal and hilar adenopathy.  Extensive right-sided pleural metastasis unchanged.  Similar appearance of diffuse bilateral pulmonary nodules.  Multifocal lytic sclerotic bone metastasis.  New superior endplate deformity of T6 04/12/2021 -  09/08/2021, patient was continued on bevacizumab and Alimta.  09/21/2021, bone scan  showed multifocal abnormal uptake including mid to distal shaft of right femur, left femoral neck and trochanter, left sacroiliac region, left ischium.  Focal activity at the proximal tibia on the left.  Multi focal heterogeneous bilateral rib activity.  Focal activity at the sternum. 09/27/2021, CT chest abdomen pelvis with contrast showed progression of infrahilar left lower lobe with a concerning clearly progressive masslike consolidation opacity.  Mild progression of small left pleural effusion.  Similar experience of extensive irregular right-sided pleural disease.  Similar experience of bony metastasis.  09/29/2021 extra cycle of maintenance bevacizumab and Alimta while waiting for approval of Lumakras  10/20/2021 started on Brittney Fisher is a 61 y.o. female who has above history reviewed by me today presents for follow up visit for management of metastatic lung cancer, acute visit for swallowing pain.  Problems and complaints are listed below Patient was accompanied by her daughter. She has been doing well clinically. Appetite is good and she has gained weight. Status post  right lower extremity palliative radiation.   Review of Systems  Constitutional:  Negative for appetite change, chills, fatigue and fever.  HENT:   Negative for hearing loss and voice change.   Eyes:  Negative for eye problems.  Respiratory:  Negative for chest tightness, cough and shortness of breath.   Cardiovascular:  Negative for chest pain.  Gastrointestinal:  Negative for abdominal distention, abdominal pain and blood in stool.  Endocrine: Negative for hot flashes.  Genitourinary:  Negative for difficulty urinating and frequency.   Musculoskeletal:  Negative for arthralgias.       Right lower extremity pain has improved.  Skin:  Negative for itching and rash.  Neurological:  Negative for  extremity weakness.  Hematological:  Negative for adenopathy.  Psychiatric/Behavioral:  Negative for confusion.      Allergies  Allergen Reactions   Carboplatin Shortness Of Breath, Itching and Cough     Past Medical History:  Diagnosis Date   COPD (chronic obstructive pulmonary disease) (HCC)    GERD (gastroesophageal reflux disease)    Metastatic lung cancer (metastasis from lung to other site) ([place]) 11/02/2020     Past Surgical History:  Procedure Laterality Date   COLONOSCOPY WITH ESOPHAGOGASTRODUODENOSCOPY (EGD)     COLONOSCOPY WITH PROPOFOL N/A 11/14/2015   Procedure: COLONOSCOPY WITH PROPOFOL;  Surgeon: Hulen Luster, MD;  Location: [facility] ENDOSCOPY;  Service: Gastroenterology;  Laterality: N/A;   ESOPHAGOGASTRODUODENOSCOPY (EGD) WITH PROPOFOL N/A 11/14/2015   Procedure: ESOPHAGOGASTRODUODENOSCOPY (EGD) WITH PROPOFOL;  Surgeon: Hulen Luster, MD;  Location: [facility] ENDOSCOPY;  Service: Gastroenterology;  Laterality: N/A;    Social History   Socioeconomic History   Marital status: Married    Spouse name: Not on file   Number of children: Not on file   Years of education: Not on file   Highest education level: Not on file  Occupational History   Not on file  Tobacco Use   Smoking status: Former    Types: Cigarettes    Quit date: 09/2021    Years since quitting: 0.2   Smokeless tobacco: Never  Vaping Use   Vaping Use: Never used  Substance and Sexual Activity   Alcohol use: Never   Drug use: Never   Sexual activity: Not Currently  Other Topics Concern   Not on file  Social History Narrative   Not on file   Social Determinants of Health   Financial Resource Strain: Not on file  Food Insecurity: Not on file  Transportation Needs: Not on file  Physical Activity: Not on file  Stress: Not on file  Social Connections: Not on file  Intimate Partner Violence: Not on file    Family History  Problem Relation Age of Onset   Breast cancer Cousin 89   Diabetes type II  Mother    Hypertension Mother    Colon cancer Father    Hypertension Father      Current Outpatient Medications:    acetaminophen (TYLENOL) 500 MG tablet, Take 1,000 mg by mouth every 8 (eight) hours as needed for moderate pain. , Disp: , Rfl:    benzonatate (TESSALON) 100 MG capsule, Take 1 capsule (100 mg total) by mouth 3 (three) times daily as needed for cough., Disp: 60 capsule, Rfl: 1   calcium carbonate (TUMS - DOSED IN MG ELEMENTAL CALCIUM) 500 MG chewable tablet, Chew 1 tablet by mouth as needed., Disp: , Rfl:    folic acid (FOLVITE) 1 MG tablet, Take 1 tablet (1 mg total) by mouth daily. Start 5-7 days before Alimta chemotherapy. Continue until 21 days after Alimta completed., Disp: 100 tablet, Rfl: 3   guaiFENesin (MUCINEX) 600 MG 12 hr tablet, Take 1 tablet (600 mg total) by mouth 2 (two) times daily., Disp: 14 tablet, Rfl: 0   ipratropium-albuterol (DUONEB) 0.5-2.5 (3) MG/3ML SOLN, Take 3 mLs by nebulization 3 (three) times daily., Disp: 360 mL, Rfl: 1   magic mouthwash w/lidocaine SOLN, Take 5 mLs by mouth 4 (four) times daily as needed for mouth pain. Sig: Swish/Swallow 5-10 ml four times a day as needed. Dispense 480 ml. 1RF, Disp: 480 mL, Rfl: 1   ondansetron (ZOFRAN) 8 MG tablet, Take 1 tablet (8 mg total) by mouth 2 (two) times daily as needed for refractory nausea / vomiting. Start on day 3 after chemo., Disp: 30 tablet, Rfl: 1   oxyCODONE (OXY IR/ROXICODONE) 5 MG immediate release tablet, Take 1 tablet (5 mg total) by mouth every 6 (six) hours as needed for moderate pain or severe pain., Disp: 120 tablet, Rfl: 0   Polyethylene Glycol 3350 (MIRALAX PO), Take by mouth daily as needed., Disp: , Rfl:    prochlorperazine (COMPAZINE) 10 MG tablet, Take 1 tablet (10 mg total) by mouth every 6 (six) hours as needed (Nausea or vomiting)., Disp: 30 tablet, Rfl: 1   protein supplement shake (PREMIER PROTEIN) LIQD, Take 2 oz by mouth 4 (four) times daily., Disp: , Rfl:    senna-docusate  (SENOKOT-S) 8.6-50 MG tablet, Take 2 tablets by mouth daily. (Patient not taking: Reported on 10/26/2021), Disp: 60 tablet, Rfl: 3   sotorasib (LUMAKRAS) 120 MG TABS, Take 960 mg by mouth daily., Disp: 240 tablet, Rfl: 0   sucralfate (CARAFATE) 1 GM/10ML suspension, Take 10 mLs (1 g total) by mouth in the morning, at noon, and at bedtime. (Patient not taking: Reported on 09/29/2021), Disp: 420 mL, Rfl: 0   tiotropium (SPIRIVA HANDIHALER) 18 MCG inhalation capsule, Place 18 mcg into inhaler and inhale daily. (Patient not taking: Reported on 08/18/2021), Disp: , Rfl:   Physical exam: ECOG 1 Vitals:   12/12/21 1455  BP: 120/76  Pulse: (!) 106  Resp: 16  Temp: 98.5 F (36.9 C)  TempSrc: Oral  SpO2: 100%  Weight: 156 lb (70.8 kg)   Physical Exam Constitutional:      General: She is not in acute distress.    Comments: Thin built female walks independently  HENT:     Head: Normocephalic and atraumatic.  Eyes:  General: No scleral icterus. Cardiovascular:     Rate and Rhythm: Normal rate and regular rhythm.     Heart sounds: Normal heart sounds.  Pulmonary:     Effort: Pulmonary effort is normal. No respiratory distress.     Breath sounds: No wheezing.     Comments: Slightly decreased breath sound bilaterally.  Abdominal:     General: Bowel sounds are normal. There is no distension.     Palpations: Abdomen is soft.  Musculoskeletal:        General: No deformity. Normal range of motion.     Cervical back: Normal range of motion and neck supple.  Skin:    General: Skin is warm and dry.     Findings: No erythema or rash.  Neurological:     Mental Status: She is alert and oriented to person, place, and time. Mental status is at baseline.     Cranial Nerves: No cranial nerve deficit.     Coordination: Coordination normal.  Psychiatric:        Mood and Affect: Mood normal.    CMP Latest Ref Rng & Units 12/12/2021  Glucose 70 - 99 mg/dL 89  BUN 8 - 23 mg/dL 16  Creatinine 0.44 -  1.00 mg/dL 0.64  Sodium 135 - 145 mmol/L 136  Potassium 3.5 - 5.1 mmol/L 3.9  Chloride 98 - 111 mmol/L 99  CO2 22 - 32 mmol/L 28  Calcium 8.9 - 10.3 mg/dL 8.6(L)  Total Protein 6.5 - 8.1 g/dL 7.3  Total Bilirubin 0.3 - 1.2 mg/dL 0.1(L)  Alkaline Phos 38 - 126 U/L 87  AST 15 - 41 U/L 20  ALT 0 - 44 U/L 19   CBC Latest Ref Rng & Units 12/12/2021  WBC 4.0 - 10.5 K/uL 4.6  Hemoglobin 12.0 - 15.0 g/dL 11.9(L)  Hematocrit 36.0 - 46.0 % 39.2  Platelets 150 - 400 K/uL 169    RADIOGRAPHIC STUDIES: I have personally reviewed the radiological images as listed and agreed with the findings in the report. NM Bone Scan Whole Body  Result Date: 09/24/2021 CLINICAL DATA:  Lung cancer bone Mets EXAM: NUCLEAR MEDICINE WHOLE BODY BONE SCAN TECHNIQUE: Whole body anterior and posterior images were obtained approximately 3 hours after intravenous injection of radiopharmaceutical. RADIOPHARMACEUTICALS:  20.32 mCi Technetium-76m MDP IV COMPARISON:  CT 07/04/2021, PET CT 10/27/2020 FINDINGS: Multifocal abnormal uptake visualized within the mid to distal shaft of right femur, left femoral neck and trochanter, left sacroiliac region, and left ischium. Focus of activity at the proximal tibia on the left. Multifocal heterogeneous bilateral rib activity. Focal activity at the sternum. Mild asymmetrical activity at the right [facility] joint and shoulder potentially degenerative. Excreted activity into the kidneys and bladder with possible mild right hydronephrosis. IMPRESSION: 1. Multifocal abnormal uptake as described above consistent with metastatic disease 2. Excreted activity into right renal collecting system with possible mild hydronephrosis on the right. Electronically Signed   By: Donavan Foil M.D.   On: 09/24/2021 20:03   CT CHEST ABDOMEN PELVIS W CONTRAST  Result Date: 09/28/2021 CLINICAL DATA:  Lung cancer.  Restaging. EXAM: CT CHEST, ABDOMEN, AND PELVIS WITH CONTRAST TECHNIQUE: Multidetector CT imaging of the  chest, abdomen and pelvis was performed following the standard protocol during bolus administration of intravenous contrast. CONTRAST:  151mL OMNIPAQUE IOHEXOL 300 MG/ML  SOLN COMPARISON:  07/04/2021 FINDINGS: CT CHEST FINDINGS Cardiovascular: The heart size is normal. No substantial pericardial effusion. Mediastinum/Nodes: Amorphous nodal/metastatic tissue in the mediastinum is similar to prior. Precarinal  disease measured previously at 18 mm is 17 mm today on 27/2. AP window lymph node measured previously at 13 mm is 11 mm today on 26/2. Lungs/Pleura: Masslike consolidative opacity in the infrahilar left lower lobe appears progressive with 6.7 x 3.8 cm lesion on 47/2 increased from 4.6 x 2.6 cm (remeasured) previously the extensive irregular right-sided pleural disease is similar although reproducible measurement is difficult due to irregular shape. Plaque-like disease along the right heart border on image 37/2 measures 6.5 x 1.8 cm today which compares to 6.6 x 2.0 cm when remeasured in a similar fashion on the prior study. 13 mm left lower lobe nodule on 48/2 was 8 mm previously (remeasured). And while 13 mm right lower lobe nodule on 48/2 was 8 mm previously (remeasured). 13 mm right lower lobe nodule on 106/3 was 12 mm previously. Interval progression of small left pleural effusion. Musculoskeletal: Lytic T2 lesion is similar to prior. CT ABDOMEN PELVIS FINDINGS Hepatobiliary: No suspicious focal abnormality within the liver parenchyma. There is no evidence for gallstones, gallbladder wall thickening, or pericholecystic fluid. No intrahepatic or extrahepatic biliary dilation. Pancreas: No focal mass lesion. No dilatation of the main duct. No intraparenchymal cyst. No peripancreatic edema. Spleen: No splenomegaly. No focal mass lesion. Adrenals/Urinary Tract: No adrenal nodule or mass. Bilateral renal cysts again noted. No evidence for hydroureter. Bladder is decompressed. Stomach/Bowel: Stomach is distended  with contrast material. Duodenum is normally positioned as is the ligament of Treitz. No small bowel wall thickening. No small bowel dilatation. The terminal ileum is normal. The appendix is normal. No gross colonic mass. No colonic wall thickening. Vascular/Lymphatic: There is advanced atherosclerotic calcification of the abdominal aorta without aneurysm. There is no gastrohepatic or hepatoduodenal ligament lymphadenopathy. No retroperitoneal or mesenteric lymphadenopathy. No pelvic sidewall lymphadenopathy. Reproductive: The uterus is unremarkable.  There is no adnexal mass. Other: No intraperitoneal free fluid. Musculoskeletal: Scattered sclerotic bone lesions again noted, similar to prior. Lucent lesion posterior left iliac bone is stable. IMPRESSION: 1. No generalized trend towards improving or progressive disease. Some of the index mediastinal disease measures stable to minimally smaller today many of the pulmonary nodules appear similar, some have progressed. The most concerning progression is in the infrahilar left lower lobe with a concerning clearly progressive masslike consolidative opacity. 2. Mild progression of small left pleural effusion. 3. Similar appearance of extensive irregular right-sided pleural disease. 4. Similar appearance of bony metastatic involvement. 5. Aortic Atherosclerosis (ICD10-I70.0). Electronically Signed   By: Misty Stanley M.D.   On: 09/28/2021 20:41       Assessment and plan  1. Primary malignant neoplasm of lung metastatic to other site, unspecified laterality ([place])   2. Bone metastasis ([place])   3. Encounter for antineoplastic chemotherapy   4. 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  cT4 N3 M1-  KRASG12C  mutation [Patient has high TMB, TPS less than 1%.  Immunotherapy in combination with chemotherapy will be other options in the future if lumakras is not effective]   Labs are reviewed and are discussed with patient She tolerates Lumakras very well.  Continue current regimen. I will repeat CT scan chest abdomen pelvis for evaluation of treatment response in late January 2022.  Bone metastasis, pain has improved.  She is currently on palliative radiation to right lower extremity. Tachycardia, this is chronic for her.  Improved heart rate.  Encourage oral hydration.  Normal thyroid function.    Lab MD 6 weeks evaluation of Lumakras tolerability, review CT results.  [name], MD, PhD [facility] Hematology Oncology 12/12/2021

## 2021-12-12 NOTE — Progress Notes (Signed)
Pt here for follow-up. Denies any new changes or concerns. Feels that she is tolerating the Lumakras well.

## 2021-12-15 ENCOUNTER — Other Ambulatory Visit: Payer: Self-pay | Admitting: Pharmacist

## 2021-12-15 ENCOUNTER — Other Ambulatory Visit: Payer: Self-pay | Admitting: Nurse Practitioner

## 2021-12-15 ENCOUNTER — Other Ambulatory Visit (HOSPITAL_COMMUNITY): Payer: Self-pay

## 2021-12-15 DIAGNOSIS — C3490 Malignant neoplasm of unspecified part of unspecified bronchus or lung: Secondary | ICD-10-CM

## 2021-12-15 MED ORDER — LUMAKRAS 120 MG PO TABS
960.0000 mg | ORAL_TABLET | Freq: Every day | ORAL | 2 refills | Status: DC
  Filled 2021-12-15: qty 240, 30d supply, fill #0

## 2021-12-19 ENCOUNTER — Other Ambulatory Visit (HOSPITAL_COMMUNITY): Payer: Self-pay

## 2021-12-20 ENCOUNTER — Encounter: Payer: Self-pay | Admitting: Pharmacist

## 2021-12-20 ENCOUNTER — Other Ambulatory Visit (HOSPITAL_COMMUNITY): Payer: Self-pay

## 2021-12-20 ENCOUNTER — Other Ambulatory Visit: Payer: Self-pay | Admitting: Pharmacist

## 2021-12-20 DIAGNOSIS — C3490 Malignant neoplasm of unspecified part of unspecified bronchus or lung: Secondary | ICD-10-CM

## 2021-12-20 MED ORDER — LUMAKRAS 120 MG PO TABS: 960.0000 mg | ORAL_TABLET | Freq: Every day | ORAL | 1 refills | Status: DC

## 2021-12-25 ENCOUNTER — Ambulatory Visit: Payer: BC Managed Care – PPO | Admitting: Radiation Oncology

## 2021-12-27 ENCOUNTER — Telehealth: Payer: Self-pay | Admitting: *Deleted

## 2021-12-27 DIAGNOSIS — C3490 Malignant neoplasm of unspecified part of unspecified bronchus or lung: Secondary | ICD-10-CM

## 2021-12-27 MED ORDER — LUMAKRAS 120 MG PO TABS: 960.0000 mg | ORAL_TABLET | Freq: Every day | ORAL | 1 refills | Status: DC

## 2021-12-27 NOTE — Telephone Encounter (Signed)
Rx redirected to [facility]

## 2021-12-27 NOTE — Telephone Encounter (Signed)
Fax from pharmacy

## 2022-01-01 ENCOUNTER — Other Ambulatory Visit (HOSPITAL_COMMUNITY): Payer: Self-pay

## 2022-01-04 ENCOUNTER — Other Ambulatory Visit: Payer: Self-pay

## 2022-01-04 ENCOUNTER — Inpatient Hospital Stay: Payer: BC Managed Care – PPO

## 2022-01-04 ENCOUNTER — Inpatient Hospital Stay: Payer: BC Managed Care – PPO | Attending: Oncology

## 2022-01-04 DIAGNOSIS — C782 Secondary malignant neoplasm of pleura: Secondary | ICD-10-CM | POA: Insufficient documentation

## 2022-01-04 DIAGNOSIS — Z79899 Other long term (current) drug therapy: Secondary | ICD-10-CM | POA: Insufficient documentation

## 2022-01-04 DIAGNOSIS — C7951 Secondary malignant neoplasm of bone: Secondary | ICD-10-CM | POA: Insufficient documentation

## 2022-01-04 DIAGNOSIS — J449 Chronic obstructive pulmonary disease, unspecified: Secondary | ICD-10-CM | POA: Insufficient documentation

## 2022-01-04 DIAGNOSIS — Z87891 Personal history of nicotine dependence: Secondary | ICD-10-CM | POA: Insufficient documentation

## 2022-01-04 DIAGNOSIS — C3411 Malignant neoplasm of upper lobe, right bronchus or lung: Secondary | ICD-10-CM | POA: Insufficient documentation

## 2022-01-04 NOTE — Progress Notes (Signed)
Nutrition Follow-up:  Patient with metastatic non small cell lung cancer.  Patient taking lumakras, oral chemotherapy.   Spoke with patient via phone.  Patient reports that her appetite is good.  She is drinking premier protein shakes 2-3 times per week. She is eating a snack or meal about every 2-3 hours.  She denies nutrition impact symptoms but has had 1 episode of diarrhea (side effect of oral chemotherapy).  She took medication to stop diarrhea.     Medications: reviewed  Labs: reviewed  Anthropometrics:   Weight 156 lb on 12/27, increased  154 lb 4.8 oz on 12/6 149 lb on 11/10 147 lb on 11/4 159 lb on 9/23 159 lb on 9/23 149 lb on 4/2    NUTRITION DIAGNOSIS: Inadequate oral intake resolved.   INTERVENTION:  Continue premier protein shakes for added nutrition Continue well balanced diet including good sources of protein for weight maintenance. Patient has RD contact information and will call if has concerns regarding nutrition    NEXT VISIT: no follow-up RD available if needed  Brittney Fisher, [place], [place] Registered Dietitian [phone] (mobile)

## 2022-01-08 ENCOUNTER — Other Ambulatory Visit (HOSPITAL_COMMUNITY): Payer: Self-pay

## 2022-01-08 IMAGING — CT CT CHEST-ABD-PELV W/ CM
2 of 5 series · 12 of 36 positions shown, 14 images · IV contrast (omnipaque)
Comparison: CT angio chest 01/05/2021 and CT AP 01/20/2021

CLINICAL DATA: Restaging lung cancer.

EXAM:
CT CHEST, ABDOMEN, AND PELVIS WITH CONTRAST
TECHNIQUE: Multidetector CT imaging of the chest, abdomen and pelvis was
performed following the standard protocol during bolus
administration of intravenous contrast.
CONTRAST:  80mL OMNIPAQUE IOHEXOL 300 MG/ML  SOLN

[Series 2: axials cap 5.00 · axial · 0.74mm/px · z∈[-1504,-979]mm · 9 of 133 slices shown, 11 images]
[im 14/133  mediastinal]
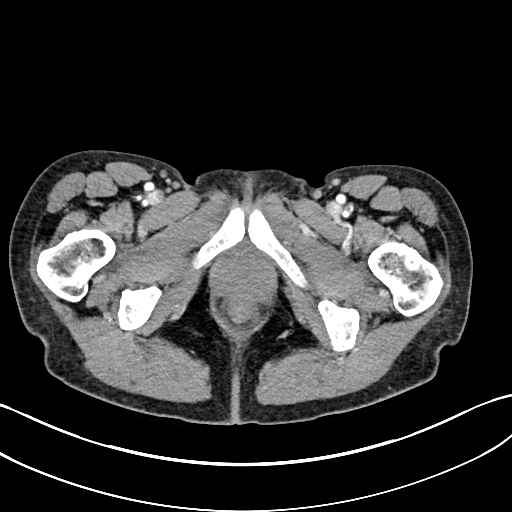
[im 14/133  bone]
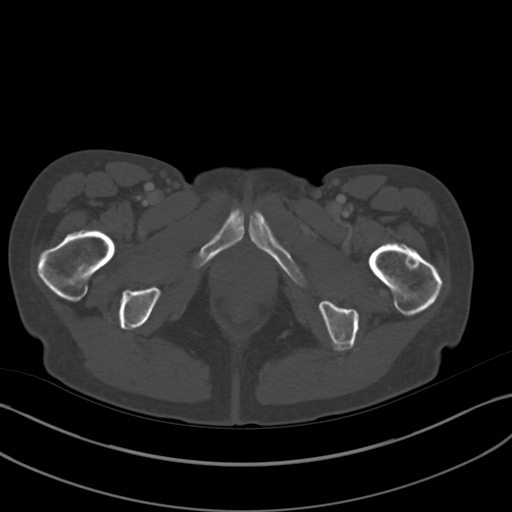
[im 27/133  mediastinal]
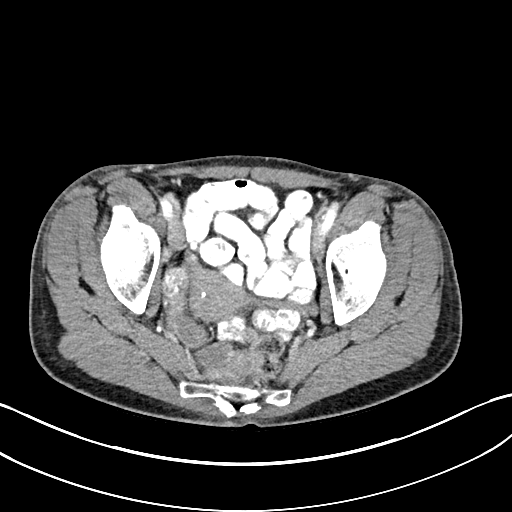
[im 40/133  mediastinal]
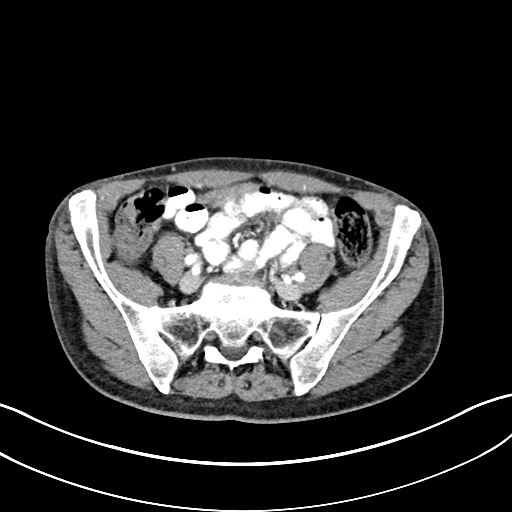
[im 53/133  mediastinal]
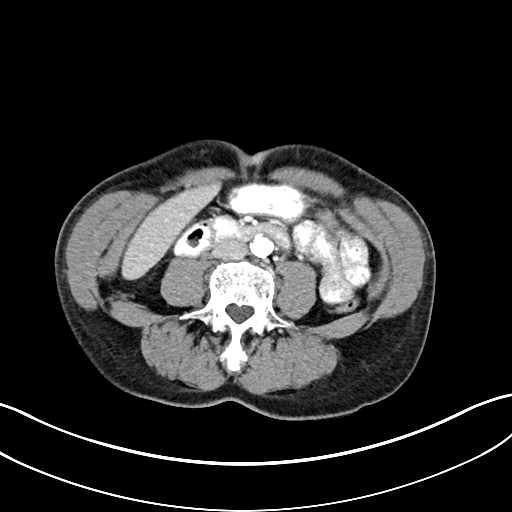
[im 67/133  mediastinal]
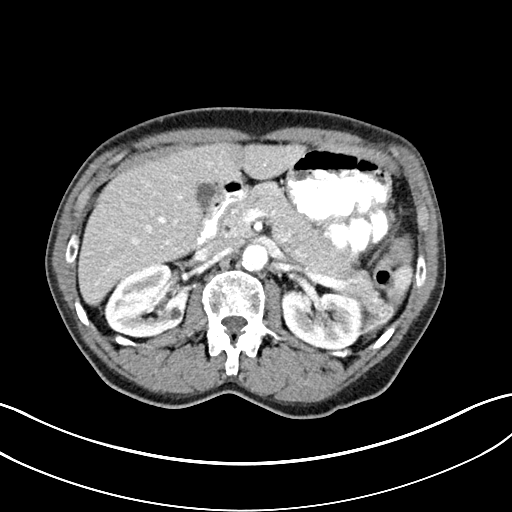
[im 80/133  mediastinal]
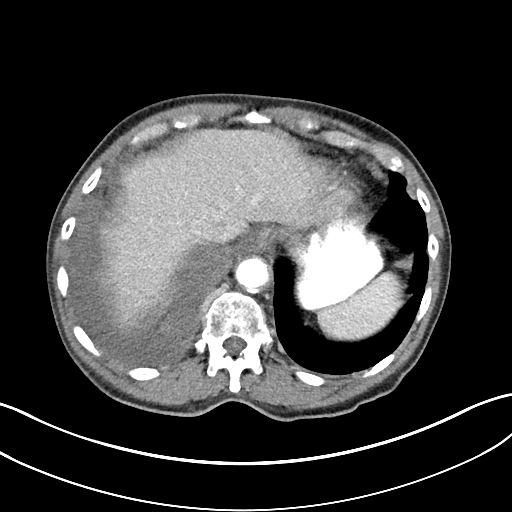
[im 93/133  mediastinal]
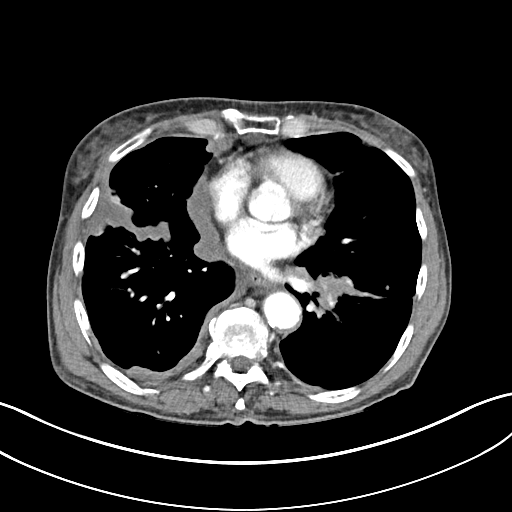
[im 106/133  mediastinal]
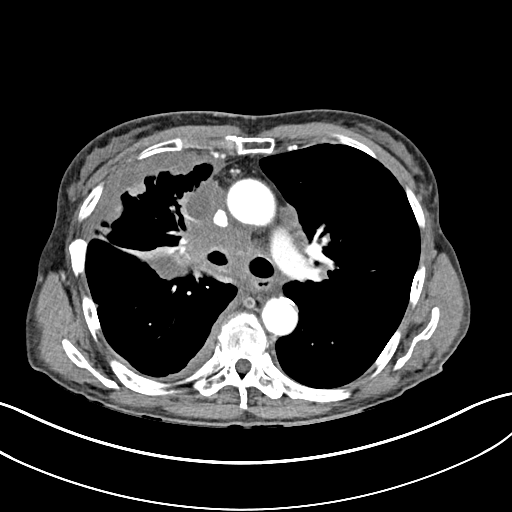
[im 119/133  mediastinal]
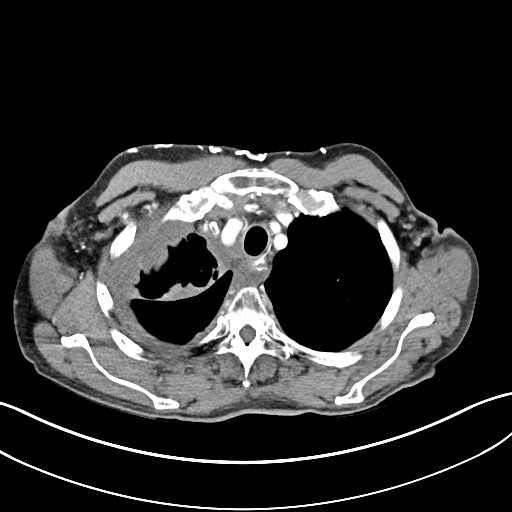
[im 119/133  bone]
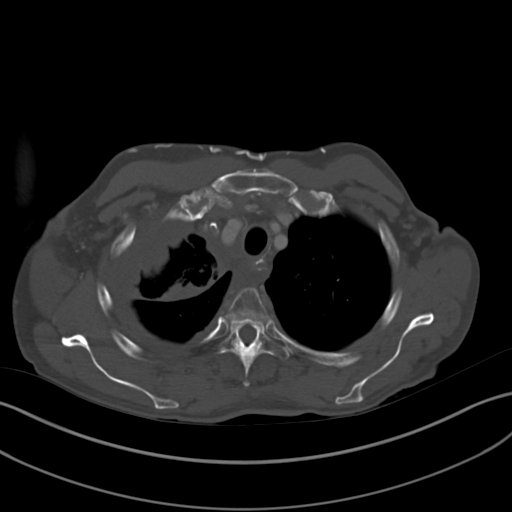

[Series 4: coronals cap 2.00 cor · coronal · 0.74mm/px · 3 of 141 slices shown]
[im 29/141  mediastinal]
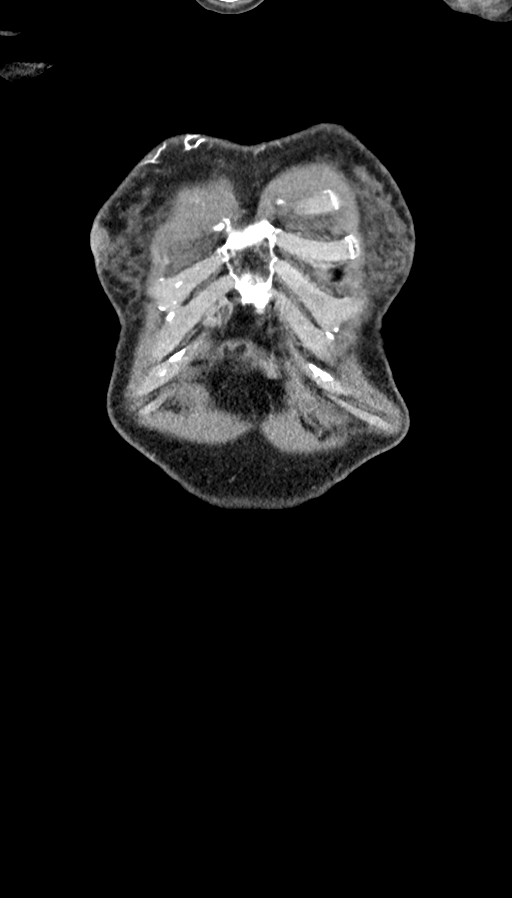
[im 57/141  mediastinal]
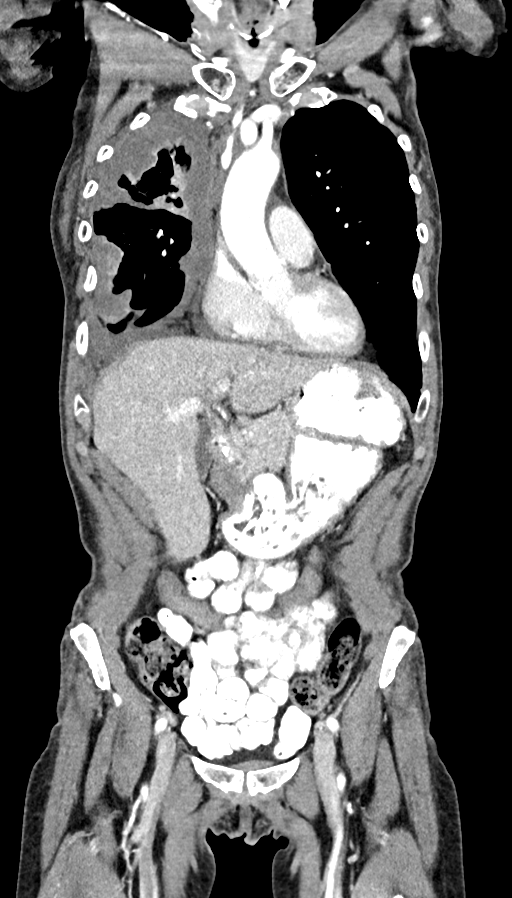
[im 85/141  mediastinal]
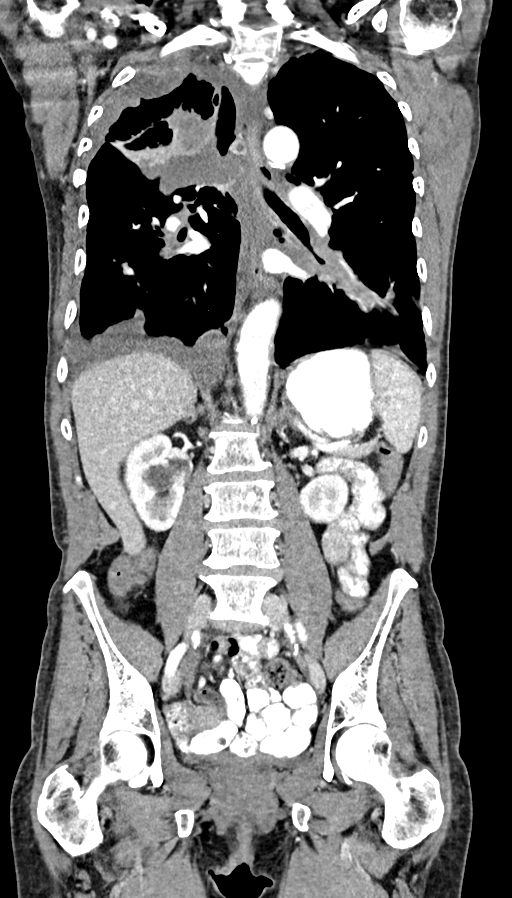

[12 of 36 positions shown; findings below may reference images not displayed]

FINDINGS: CT CHEST FINDINGS

Cardiovascular: Heart size is within normal limits. No pericardial
effusion identified. Aortic atherosclerosis.

Mediastinum/Nodes: Extensive thoracic adenopathy.

Right paratracheal lymph node measures 2.1 cm, image [DATE].
Previously 2.3 cm.

Low right paratracheal lymph node measures 1.8 cm, image [DATE].
Previously 1.9 cm.

Left pre vascular lymph node measures 1.3 cm, image [DATE]. Previously
1.5 cm.

Subcarinal node measures 1.9 cm, image [DATE].  Previously 2.4 cm.

Right hilar node measures 1.4 cm, image 32/2.  Previously 1.6 cm.

Left hilar node measures 1.4 cm, image 39/2.  Previously 1.8 cm.

Lungs/Pleura: Rind of tumor encasing the right lung is again noted
compatible with metastatic disease to the pleura. Interval
re-expansion of the right upper lobe with decreased partial
atelectasis of the right middle lobe. As on the previous exam the
margins of the right lung mass are difficult to delineate. Best
estimate this currently measures 8.2 x 4.8 cm, image [DATE].
Previously 8.4 x 6.1 cm multiple pulmonary nodules are scattered
throughout both lungs compatible with metastatic disease. These
appear similar in size, multiplicity and distribution.

Musculoskeletal: Mixed lytic and sclerotic bone lesions are again
noted within the visualized axial and proximal appendicular
skeleton. These appear similar to the previous exam. Superior
endplate deformity involving the T6 vertebra is new from 01/05/2021.

CT ABDOMEN PELVIS FINDINGS

Hepatobiliary: No focal liver abnormality is seen. No gallstones,
gallbladder wall thickening, or biliary dilatation.

Pancreas: Unremarkable. No pancreatic ductal dilatation or
surrounding inflammatory changes.

Spleen: Normal in size without focal abnormality.

Adrenals/Urinary Tract: Right-sided parapelvic cyst scratch set
normal adrenal glands. Right-sided parapelvic cysts. Upper pole left
kidney cyst measures 1.8 cm. No hydronephrosis identified
bilaterally. Bladder appears collapsed.

Stomach/Bowel: Stomach is nondistended. The appendix is visualized
and appears normal. No bowel wall thickening, inflammation or
distension.

Vascular/Lymphatic: Aortic atherosclerosis. No aneurysm. No
abdominopelvic adenopathy.

Reproductive: Uterus and bilateral adnexa are unremarkable.

Other: No ascites or discrete fluid collection.

Musculoskeletal: Multifocal lytic and sclerotic bone metastases are
again noted. These appears stable when compared with the previous
study.
IMPRESSION: 1. Mild decrease in size of right lung mass with interval
re-expansion of the right upper lobe with decreased subsegmental
atelectasis of the right middle lobe.
2. Mediastinal and hilar adenopathy is mildly improved in the
interval.
3. Extensive right-sided pleural metastasis, not significantly
changed.
4. Similar appearance of diffuse bilateral pulmonary nodules
compatible with metastatic disease.
5. Stable appearance of multifocal lytic and sclerotic bone
metastases.
6. New mild superior endplate deformity is identified at T6.
7. Aortic atherosclerosis.

Aortic Atherosclerosis ([id number]).

## 2022-01-09 ENCOUNTER — Ambulatory Visit
Admission: RE | Admit: 2022-01-09 | Discharge: 2022-01-09 | Disposition: A | Discharge status: Home / Self Care | Payer: BC Managed Care – PPO | Source: Ambulatory Visit | Attending: Oncology | Admitting: Oncology

## 2022-01-09 ENCOUNTER — Other Ambulatory Visit: Payer: Self-pay

## 2022-01-09 DIAGNOSIS — C3490 Malignant neoplasm of unspecified part of unspecified bronchus or lung: Secondary | ICD-10-CM | POA: Insufficient documentation

## 2022-01-09 MED ORDER — IOHEXOL 300 MG/ML  SOLN
100.0000 mL | Freq: Once | INTRAMUSCULAR | Status: AC | PRN
  Administered 2022-01-09: 09:00:00 100 mL via INTRAVENOUS

## 2022-01-11 ENCOUNTER — Ambulatory Visit
Admission: RE | Admit: 2022-01-11 | Discharge: 2022-01-11 | Disposition: A | Discharge status: Home / Self Care | Payer: BC Managed Care – PPO | Source: Ambulatory Visit | Attending: Radiation Oncology | Admitting: Radiation Oncology

## 2022-01-11 ENCOUNTER — Encounter: Payer: Self-pay | Admitting: Radiation Oncology

## 2022-01-11 ENCOUNTER — Inpatient Hospital Stay (HOSPITAL_BASED_OUTPATIENT_CLINIC_OR_DEPARTMENT_OTHER): Payer: BC Managed Care – PPO | Admitting: Oncology

## 2022-01-11 ENCOUNTER — Inpatient Hospital Stay: Payer: BC Managed Care – PPO

## 2022-01-11 ENCOUNTER — Encounter: Payer: Self-pay | Admitting: Oncology

## 2022-01-11 ENCOUNTER — Other Ambulatory Visit: Payer: Self-pay | Admitting: Oncology

## 2022-01-11 ENCOUNTER — Other Ambulatory Visit: Payer: Self-pay

## 2022-01-11 VITALS — BP 104/71 | HR 103 | Temp 97.6°F | Resp 16 | Wt 158.0 lb

## 2022-01-11 VITALS — BP 100/69 | HR 106 | Temp 97.7°F | Resp 16 | Wt 158.0 lb

## 2022-01-11 DIAGNOSIS — C7951 Secondary malignant neoplasm of bone: Secondary | ICD-10-CM

## 2022-01-11 DIAGNOSIS — C3490 Malignant neoplasm of unspecified part of unspecified bronchus or lung: Secondary | ICD-10-CM

## 2022-01-11 DIAGNOSIS — Z79899 Other long term (current) drug therapy: Secondary | ICD-10-CM | POA: Diagnosis not present

## 2022-01-11 DIAGNOSIS — C782 Secondary malignant neoplasm of pleura: Secondary | ICD-10-CM | POA: Diagnosis present

## 2022-01-11 DIAGNOSIS — Z87891 Personal history of nicotine dependence: Secondary | ICD-10-CM | POA: Diagnosis not present

## 2022-01-11 DIAGNOSIS — C3411 Malignant neoplasm of upper lobe, right bronchus or lung: Secondary | ICD-10-CM | POA: Diagnosis present

## 2022-01-11 DIAGNOSIS — I871 Compression of vein: Secondary | ICD-10-CM | POA: Diagnosis not present

## 2022-01-11 DIAGNOSIS — Z5111 Encounter for antineoplastic chemotherapy: Secondary | ICD-10-CM

## 2022-01-11 DIAGNOSIS — J449 Chronic obstructive pulmonary disease, unspecified: Secondary | ICD-10-CM | POA: Diagnosis not present

## 2022-01-11 DIAGNOSIS — Z923 Personal history of irradiation: Secondary | ICD-10-CM | POA: Diagnosis not present

## 2022-01-11 LAB — CBC WITH DIFFERENTIAL/PLATELET
Abs Immature Granulocytes: 0.01 10*3/uL (ref 0.00–0.07)
Basophils Absolute: 0 10*3/uL (ref 0.0–0.1)
Basophils Relative: 1 %
Eosinophils Absolute: 0 10*3/uL (ref 0.0–0.5)
Eosinophils Relative: 1 %
HCT: 39.1 % (ref 36.0–46.0)
Hemoglobin: 12.3 g/dL (ref 12.0–15.0)
Immature Granulocytes: 0 %
Lymphocytes Relative: 18 %
Lymphs Abs: 0.6 10*3/uL — ABNORMAL LOW (ref 0.7–4.0)
MCH: 26.5 pg (ref 26.0–34.0)
MCHC: 31.5 g/dL (ref 30.0–36.0)
MCV: 84.1 fL (ref 80.0–100.0)
Monocytes Absolute: 0.4 10*3/uL (ref 0.1–1.0)
Monocytes Relative: 12 %
Neutro Abs: 2.4 10*3/uL (ref 1.7–7.7)
Neutrophils Relative %: 68 %
Platelets: 146 10*3/uL — ABNORMAL LOW (ref 150–400)
RBC: 4.65 MIL/uL (ref 3.87–5.11)
RDW: 16.5 % — ABNORMAL HIGH (ref 11.5–15.5)
WBC: 3.5 10*3/uL — ABNORMAL LOW (ref 4.0–10.5)
nRBC: 0 % (ref 0.0–0.2)

## 2022-01-11 LAB — COMPREHENSIVE METABOLIC PANEL
ALT: 14 U/L (ref 0–44)
AST: 17 U/L (ref 15–41)
Albumin: 3.2 g/dL — ABNORMAL LOW (ref 3.5–5.0)
Alkaline Phosphatase: 70 U/L (ref 38–126)
Anion gap: 9 (ref 5–15)
BUN: 13 mg/dL (ref 8–23)
CO2: 28 mmol/L (ref 22–32)
Calcium: 9.1 mg/dL (ref 8.9–10.3)
Chloride: 101 mmol/L (ref 98–111)
Creatinine, Ser: 0.8 mg/dL (ref 0.44–1.00)
GFR, Estimated: >60 mL/min (ref >60)
Glucose, Bld: 100 mg/dL — ABNORMAL HIGH (ref 70–99)
Potassium: 4 mmol/L (ref 3.5–5.1)
Sodium: 138 mmol/L (ref 135–145)
Total Bilirubin: 0.6 mg/dL (ref 0.3–1.2)
Total Protein: 7.5 g/dL (ref 6.5–8.1)

## 2022-01-11 NOTE — Progress Notes (Signed)
Pt in for follow up, denies any difficulties or concerns.

## 2022-01-11 NOTE — Progress Notes (Signed)
Hematology/Oncology Follow Up Note [phone] [phone]  Patient Care Team: Pcp, No as PCP - General Brittney Nab, RN as Oncology Nurse Navigator Brittney Server, MD as Medical Oncologist (Oncology)   Name of the patient: Brittney Fisher  [id number]  1961-04-28   REASON FOR VISIT follow-up for lung cancer  PERTINENT ONCOLOGY HISTORY 61 y.o. female with past medical history including GERD, COPD, current everyday smoker presents for follow-up of lung mass and pleural effusion. 10/20/2020, patient was brought to ED via EMS due to generalized weakness, dizziness, chest pain shortness of breath.  She was recently diagnosed with COPD approximately 1 month ago by primary care provider.  Also unintentional weight loss during the last few months. Image work-up showed right-sided pleural effusion.  She underwent right thoracentesis.  With removal of 2.4 L of fluid. 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  Metastatic disease to low cervical/thoracic nodes, lung, right pleural space and bones.  Moderate to large right pleural effusion.  SVC narrowing. 10/21/2020 MRI brain is negative for metastasis.  Mild chronic microvascular ischemic changes in the white matter.  Negative for acute infarct Regarding to the SVC narrowing, patient was seen by vascular surgeon and was recommended no intervention inpatient.  Patient to follow-up outpatient with vascular surgeon for evaluation. Patient also was treated for COPD exacerbation with hypoxia on exertion. Qualifies for home oxygen and hospitalist arrange home health and a nebulizer.  Patient was given a course of prednisone taper and empiric Levaquin. Patient was discharged and present today to follow-up with cytology results and further management plan  10/27/2020 PET scan showed right upper lobe primary bronchogenic mass with direct invasion into  mediastinum. Metastatic disease to the right pleural space, lung, bone, nodes of the chest and less so lower leg/upper abdomen. Hypermetabolic him along the course of the right axillary vein with concurrent subtle hyper attenuation within the axillary vein and SVC.  This continues to the level of SVC narrowing.  Suspicious for SVC occlusion and developing thrombus. Moderate right pleural effusion and small pericardial effusion.  # # left supraclavicular mass biopsy- pathology is positive for adenocarcinoma. positive for CK7, with patchy, weak CK20.  They are  negative for TTF1, NapsinA, GATA3, CDX2, and Pax8.  The findings are  nonspecific; but may be compatible with a poorly differentiated  adenocarcinoma of lung origin, especially given the imaging findings  She also had another repeat thoracentesis and fluid cytology showed malignancy.  #NGS showed AXIN 1 [id number], DIS2 M172f, KRAS G12C, PRDM1 M1 [id number], [id number] MS stable, TPS <1%   11/14/2020- 12/12/2020.  #Bronchial obstruction and SVC occlusion.  s/p palliative radiation #After 2 cycles of chemtherapy, patient had CT scan done due to shortness of breath.  01/05/2021, CT chest angiogram PE protocol showed no evidence of pulmonary embolism.  Stable disease.  Right upper lobe mass about 6.7 x 5.3 cm.  Widespread metastatic disease, lung and bone involvement. Interval development of right-sided hydronephrosis. 03/06/2021 finished T2 palliative radiation 03/22/2021, 6 cycles of carboplatin/Alimta/bevacizumab.  Infusion reaction of carboplatin during cycle 6.  Carboplatin discontinued. 04/07/2021 CT chest abdomen pelvis-partial response Mild decrease in size of right lung mass, mildly decreased mediastinal and hilar adenopathy.  Extensive right-sided pleural metastasis unchanged.  Similar appearance of diffuse bilateral pulmonary nodules.  Multifocal lytic sclerotic bone metastasis.  New superior endplate deformity of T6 04/12/2021 -  09/08/2021, patient was continued on bevacizumab and Alimta.  09/21/2021, bone scan  showed multifocal abnormal uptake including mid to distal shaft of right femur, left femoral neck and trochanter, left sacroiliac region, left ischium.  Focal activity at the proximal tibia on the left.  Multi focal heterogeneous bilateral rib activity.  Focal activity at the sternum. 09/27/2021, CT chest abdomen pelvis with contrast showed progression of infrahilar left lower lobe with a concerning clearly progressive masslike consolidation opacity.  Mild progression of small left pleural effusion.  Similar experience of extensive irregular right-sided pleural disease.  Similar experience of bony metastasis.  09/29/2021 extra cycle of maintenance bevacizumab and Alimta while waiting for approval of Lumakras  10/20/2021 started on [name] is a 61 y.o. female who has above history reviewed by me today presents for follow up visit for management of metastatic lung cancer, acute visit for swallowing pain.  Problems and complaints are listed below Patient was accompanied by her daughter. She has been doing well clinically.  Patient takes Lumakras.  She tolerates well.  Appetite is good and she has gained weight. Right lower extremity pain has resolved.   Review of Systems  Constitutional:  Negative for appetite change, chills, fatigue and fever.  HENT:   Negative for hearing loss and voice change.   Eyes:  Negative for eye problems.  Respiratory:  Negative for chest tightness, cough and shortness of breath.   Cardiovascular:  Negative for chest pain.  Gastrointestinal:  Negative for abdominal distention, abdominal pain and blood in stool.  Endocrine: Negative for hot flashes.  Genitourinary:  Negative for difficulty urinating and frequency.   Musculoskeletal:  Negative for arthralgias.  Skin:  Negative for itching and rash.  Neurological:  Negative for extremity weakness.   Hematological:  Negative for adenopathy.  Psychiatric/Behavioral:  Negative for confusion.      Allergies  Allergen Reactions   Carboplatin Shortness Of Breath, Itching and Cough     Past Medical History:  Diagnosis Date   COPD (chronic obstructive pulmonary disease) (HCC)    GERD (gastroesophageal reflux disease)    Metastatic lung cancer (metastasis from lung to other site) ([place]) 11/02/2020     Past Surgical History:  Procedure Laterality Date   COLONOSCOPY WITH ESOPHAGOGASTRODUODENOSCOPY (EGD)     COLONOSCOPY WITH PROPOFOL N/A 11/14/2015   Procedure: COLONOSCOPY WITH PROPOFOL;  Surgeon: Hulen Luster, MD;  Location: [facility] ENDOSCOPY;  Service: Gastroenterology;  Laterality: N/A;   ESOPHAGOGASTRODUODENOSCOPY (EGD) WITH PROPOFOL N/A 11/14/2015   Procedure: ESOPHAGOGASTRODUODENOSCOPY (EGD) WITH PROPOFOL;  Surgeon: Hulen Luster, MD;  Location: [facility] ENDOSCOPY;  Service: Gastroenterology;  Laterality: N/A;    Social History   Socioeconomic History   Marital status: Married    Spouse name: Not on file   Number of children: Not on file   Years of education: Not on file   Highest education level: Not on file  Occupational History   Not on file  Tobacco Use   Smoking status: Former    Types: Cigarettes    Quit date: 09/2021    Years since quitting: 0.3   Smokeless tobacco: Never  Vaping Use   Vaping Use: Never used  Substance and Sexual Activity   Alcohol use: Never   Drug use: Never   Sexual activity: Not Currently  Other Topics Concern   Not on file  Social History Narrative   Not on file   Social Determinants of Health   Financial Resource Strain: Not on file  Food Insecurity: Not on file  Transportation Needs: Not on  file  Physical Activity: Not on file  Stress: Not on file  Social Connections: Not on file  Intimate Partner Violence: Not on file    Family History  Problem Relation Age of Onset   Breast cancer Cousin 39   Diabetes type II Mother     Hypertension Mother    Colon cancer Father    Hypertension Father      Current Outpatient Medications:    acetaminophen (TYLENOL) 500 MG tablet, Take 1,000 mg by mouth every 8 (eight) hours as needed for moderate pain. , Disp: , Rfl:    benzonatate (TESSALON) 100 MG capsule, Take 1 capsule (100 mg total) by mouth 3 (three) times daily as needed for cough., Disp: 60 capsule, Rfl: 1   calcium carbonate (TUMS - DOSED IN MG ELEMENTAL CALCIUM) 500 MG chewable tablet, Chew 1 tablet by mouth as needed., Disp: , Rfl:    folic acid (FOLVITE) 1 MG tablet, Take 1 tablet (1 mg total) by mouth daily. Start 5-7 days before Alimta chemotherapy. Continue until 21 days after Alimta completed., Disp: 100 tablet, Rfl: 3   guaiFENesin (MUCINEX) 600 MG 12 hr tablet, Take 1 tablet (600 mg total) by mouth 2 (two) times daily., Disp: 14 tablet, Rfl: 0   ipratropium-albuterol (DUONEB) 0.5-2.5 (3) MG/3ML SOLN, Take 3 mLs by nebulization 3 (three) times daily., Disp: 360 mL, Rfl: 1   magic mouthwash w/lidocaine SOLN, Take 5 mLs by mouth 4 (four) times daily as needed for mouth pain. Sig: Swish/Swallow 5-10 ml four times a day as needed. Dispense 480 ml. 1RF, Disp: 480 mL, Rfl: 1   ondansetron (ZOFRAN) 8 MG tablet, Take 1 tablet (8 mg total) by mouth 2 (two) times daily as needed for refractory nausea / vomiting. Start on day 3 after chemo., Disp: 30 tablet, Rfl: 1   oxyCODONE (OXY IR/ROXICODONE) 5 MG immediate release tablet, Take 1 tablet (5 mg total) by mouth every 6 (six) hours as needed for moderate pain or severe pain., Disp: 120 tablet, Rfl: 0   Polyethylene Glycol 3350 (MIRALAX PO), Take by mouth daily as needed., Disp: , Rfl:    prochlorperazine (COMPAZINE) 10 MG tablet, Take 1 tablet (10 mg total) by mouth every 6 (six) hours as needed (Nausea or vomiting)., Disp: 30 tablet, Rfl: 1   protein supplement shake (PREMIER PROTEIN) LIQD, Take 2 oz by mouth 4 (four) times daily., Disp: , Rfl:    sotorasib (LUMAKRAS) 120  MG TABS, Take 960 mg (8 tablets) by mouth daily., Disp: 240 tablet, Rfl: 1   senna-docusate (SENOKOT-S) 8.6-50 MG tablet, Take 2 tablets by mouth daily. (Patient not taking: Reported on 10/26/2021), Disp: 60 tablet, Rfl: 3   sucralfate (CARAFATE) 1 GM/10ML suspension, Take 10 mLs (1 g total) by mouth in the morning, at noon, and at bedtime. (Patient not taking: Reported on 01/11/2022), Disp: 420 mL, Rfl: 0  Physical exam: ECOG 1 Vitals:   01/11/22 1107  BP: 104/71  Pulse: (!) 103  Resp: 16  Temp: 97.6 F (36.4 C)  TempSrc: Tympanic  SpO2: 99%  Weight: 158 lb (71.7 kg)   Physical Exam Constitutional:      General: She is not in acute distress.    Comments: Thin built female walks independently  HENT:     Head: Normocephalic and atraumatic.  Eyes:     General: No scleral icterus. Cardiovascular:     Rate and Rhythm: Normal rate and regular rhythm.     Heart sounds: Normal heart sounds.  Pulmonary:     Effort: Pulmonary effort is normal. No respiratory distress.     Breath sounds: No wheezing.     Comments: Slightly decreased breath sound bilaterally.  Abdominal:     General: Bowel sounds are normal. There is no distension.     Palpations: Abdomen is soft.  Musculoskeletal:        General: No deformity. Normal range of motion.     Cervical back: Normal range of motion and neck supple.  Skin:    General: Skin is warm and dry.     Findings: No erythema or rash.  Neurological:     Mental Status: She is alert and oriented to person, place, and time. Mental status is at baseline.     Cranial Nerves: No cranial nerve deficit.     Coordination: Coordination normal.  Psychiatric:        Mood and Affect: Mood normal.    CMP Latest Ref Rng & Units 01/11/2022  Glucose 70 - 99 mg/dL 100(H)  BUN 8 - 23 mg/dL 13  Creatinine 0.44 - 1.00 mg/dL 0.80  Sodium 135 - 145 mmol/L 138  Potassium 3.5 - 5.1 mmol/L 4.0  Chloride 98 - 111 mmol/L 101  CO2 22 - 32 mmol/L 28  Calcium 8.9 - 10.3  mg/dL 9.1  Total Protein 6.5 - 8.1 g/dL 7.5  Total Bilirubin 0.3 - 1.2 mg/dL 0.6  Alkaline Phos 38 - 126 U/L 70  AST 15 - 41 U/L 17  ALT 0 - 44 U/L 14   CBC Latest Ref Rng & Units 01/11/2022  WBC 4.0 - 10.5 K/uL 3.5(L)  Hemoglobin 12.0 - 15.0 g/dL 12.3  Hematocrit 36.0 - 46.0 % 39.1  Platelets 150 - 400 K/uL 146(L)    RADIOGRAPHIC STUDIES: I have personally reviewed the radiological images as listed and agreed with the findings in the report. CT CHEST ABDOMEN PELVIS W CONTRAST  Result Date: 01/09/2022 CLINICAL DATA:  Lung cancer.  Restaging. EXAM: CT CHEST, ABDOMEN, AND PELVIS WITH CONTRAST TECHNIQUE: Multidetector CT imaging of the chest, abdomen and pelvis was performed following the standard protocol during bolus administration of intravenous contrast. RADIATION DOSE REDUCTION: This exam was performed according to the departmental dose-optimization program which includes automated exposure control, adjustment of the mA and/or kV according to patient size and/or use of iterative reconstruction technique. CONTRAST:  133mL OMNIPAQUE IOHEXOL 300 MG/ML  SOLN COMPARISON:  09/27/2021 FINDINGS: CT CHEST FINDINGS Cardiovascular: The heart size is normal. No substantial pericardial effusion. Mild atherosclerotic calcification is noted in the wall of the thoracic aorta. Mediastinum/Nodes: Abnormal confluent soft tissue in the right hilum and mediastinum is similar to prior. Previously not measured precarinal node is similar measuring 15 mm short axis today compared to 17 mm previously. The esophagus has normal imaging features. Lungs/Pleura: Collapse/consolidation in the central left lower lobe again noted. Masslike consolidative component measured previously at 6.7 x 3.8 cm is 5.7 x 3.0 cm today on image 44/2. The right parahilar consolidative airspace disease seen previously is similar today. The nearly circumferential irregular pleural thickening involving the right hemithorax is similar to prior.  Plaque-like lesion along the right medial pleura measured previously at 6.5 x 1.8 cm previously is not substantially changed at 6.9 x 1.8 cm today on image 35/2. 13 mm anterior right lung nodule on 95/3 is stable in the interval. Posterior right lower lobe nodule measured previously at 13 mm is 9 mm today on image 105/3. 12 mm bilobed left upper lobe nodule on  84/3 was 12 mm previously (remeasured). 8 mm paraspinal left lower lobe nodule on 01/04/3 was 8 mm previously (remeasured). Similar small bilateral pleural effusions. Musculoskeletal: Sclerotic thoracic spine lesions and rib lesions are stable. Stable appearance of the sclerotic lesion involving the manubrium. CT ABDOMEN PELVIS FINDINGS Hepatobiliary: No suspicious focal abnormality within the liver parenchyma. There is no evidence for gallstones, gallbladder wall thickening, or pericholecystic fluid. No intrahepatic or extrahepatic biliary dilation. Pancreas: No focal mass lesion. No dilatation of the main duct. No intraparenchymal cyst. No peripancreatic edema. Spleen: No splenomegaly. No focal mass lesion. Adrenals/Urinary Tract: No adrenal nodule or mass. Multiple bilateral renal cysts again noted No evidence for hydroureter. The urinary bladder appears normal for the degree of distention. Stomach/Bowel: Stomach is unremarkable. No gastric wall thickening. No evidence of outlet obstruction. Duodenum is normally positioned as is the ligament of Treitz. No small bowel wall thickening. No small bowel dilatation. The terminal ileum is normal. The appendix is not well visualized, but there is no edema or inflammation in the region of the cecum. No gross colonic mass. No colonic wall thickening. Vascular/Lymphatic: There is mild atherosclerotic calcification of the abdominal aorta without aneurysm. There is no gastrohepatic or hepatoduodenal ligament lymphadenopathy. No retroperitoneal or mesenteric lymphadenopathy. No pelvic sidewall lymphadenopathy.  Reproductive: The uterus is unremarkable.  There is no adnexal mass. Other: No intraperitoneal free fluid. Musculoskeletal: Numerous sclerotic bone metastases again noted lumbar spine and bony pelvis. Index lesion right iliac bone adjacent to the SI joint measures 14 mm today compared to 11 mm previously and is more densely sclerotic than before. Similarly, a 15 mm sclerotic posterior left iliac lesion on 93/2 is similar in size but more densely sclerotic on the current study. IMPRESSION: 1. No substantial interval change in exam. The masslike consolidative component in the central left lower lobe is similar to prior. The right parahilar consolidative airspace disease seen previously is not substantially changed. 2. Circumferential irregular pleural thickening involving the right hemithorax is not appreciably changed in the interval. 3. Multiple bilateral pulmonary nodules are similar to prior with no clear improvement or progression in the interval. 4. Numerous sclerotic bone metastases again noted. Some of these lesions are more densely sclerotic on the current exam suggesting interval healing. 5. Aortic Atherosclerosis (ICD10-I70.0). Electronically Signed   By: Misty Stanley M.D.   On: 01/09/2022 12:35       Assessment and plan  1. Primary malignant neoplasm of lung metastatic to other site, unspecified laterality ([place])   2. Bone metastasis ([place])   3. 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  cT4 N3 M1-  KRASG12C mutation [Patient has high TMB, TPS less than 1%.  Immunotherapy in combination with chemotherapy will be other options in the future if lumakras is not effective] 01/09/2022, CT chest abdomen pelvis  with contrast showed no substantial interval changes in exam.  Stable disease. Labs are reviewed and discussed with patient. She tolerates Lumakras, continue current regimen.  Bone metastasis, pain has improved.  Status post palliative radiation to right lower extremity. , We discussed again about dental clearance for bisphosphonate treatments.  Patient will consider and update me. Tachycardia, this is chronic for her.  Improved heart rate.  Encourage oral  hydration.  Normal thyroid function.    Lab MD 6 weeks evaluation of Lumakras tolerability,  Brittney Server, MD, PhD [facility] Hematology Oncology 01/11/2022

## 2022-01-11 NOTE — Progress Notes (Signed)
Radiation Oncology Follow up Note  Name: Brittney Fisher   Date:   01/11/2022 MRN:  [id number] DOB: Jun 16, 1961    This 61 y.o. 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  REFERRING PROVIDER: No ref. provider found  HPI: Patient is a 61 year old female now at 1 month from palliative radiation therapy to her left pelvis as well as left proximal femur for metastatic stage IV adenocarcinoma.  She is seen today in routine follow-up doing well she is having no pains occasionally some stiffness in her back.  She is ambulating well.  She is currently on.Lumakras and tolerating that well.  COMPLICATIONS OF TREATMENT: none  FOLLOW UP COMPLIANCE: keeps appointments   PHYSICAL EXAM:  BP 100/69 (BP Location: Left Arm, Patient Position: Sitting)    Pulse (!) 106    Temp 97.7 F (36.5 C) (Tympanic)    Resp 16    Wt 158 lb (71.7 kg)    BMI 22.04 kg/m  Range of motion of the lower extremities does not also pain.  The palpation of her spine does not elicit pain.  Well-developed well-nourished patient in NAD. HEENT reveals PERLA, EOMI, discs not visualized.  Oral cavity is clear. No oral mucosal lesions are identified. Neck is clear without evidence of cervical or supraclavicular adenopathy. Lungs are clear to A&P. Cardiac examination is essentially unremarkable with regular rate and rhythm without murmur rub or thrill. Abdomen is benign with no organomegaly or masses noted. Motor sensory and DTR levels are equal and symmetric in the upper and lower extremities. Cranial nerves II through XII are grossly intact. Proprioception is intact. No peripheral adenopathy or edema is identified. No motor or sensory levels are noted. Crude visual fields are  within normal range.  RADIOLOGY RESULTS: No current films for review.  PLAN: Present time patient is achieved excellent palliative benefit from radiation therapy.  I will turn follow-up care over to medical oncology.  She continues immunotherapy treatment.  I be happy to reevaluate the patient anytime should further palliative treatment be indicated.  I would like to take this opportunity to thank you for allowing me to participate in the care of your patient.Noreene Filbert, MD

## 2022-01-12 ENCOUNTER — Encounter: Payer: Self-pay | Admitting: Oncology

## 2022-02-05 ENCOUNTER — Other Ambulatory Visit: Payer: Self-pay | Admitting: *Deleted

## 2022-02-05 MED ORDER — OXYCODONE HCL 5 MG PO TABS: 5.0000 mg | ORAL_TABLET | Freq: Four times a day (QID) | ORAL | 0 refills | Status: DC | PRN

## 2022-02-09 ENCOUNTER — Other Ambulatory Visit: Payer: BC Managed Care – PPO

## 2022-02-09 ENCOUNTER — Ambulatory Visit: Payer: BC Managed Care – PPO | Admitting: Oncology

## 2022-02-12 ENCOUNTER — Other Ambulatory Visit (HOSPITAL_COMMUNITY): Payer: Self-pay

## 2022-02-13 ENCOUNTER — Encounter: Payer: Self-pay | Admitting: Oncology

## 2022-02-13 ENCOUNTER — Inpatient Hospital Stay: Payer: BC Managed Care – PPO

## 2022-02-13 ENCOUNTER — Other Ambulatory Visit: Payer: Self-pay

## 2022-02-13 ENCOUNTER — Inpatient Hospital Stay: Payer: BC Managed Care – PPO | Attending: Radiation Oncology | Admitting: Oncology

## 2022-02-13 ENCOUNTER — Other Ambulatory Visit (HOSPITAL_COMMUNITY): Payer: Self-pay

## 2022-02-13 VITALS — BP 109/72 | HR 109 | Temp 96.7°F | Wt 161.3 lb

## 2022-02-13 DIAGNOSIS — J449 Chronic obstructive pulmonary disease, unspecified: Secondary | ICD-10-CM | POA: Diagnosis not present

## 2022-02-13 DIAGNOSIS — Z833 Family history of diabetes mellitus: Secondary | ICD-10-CM | POA: Diagnosis not present

## 2022-02-13 DIAGNOSIS — N281 Cyst of kidney, acquired: Secondary | ICD-10-CM | POA: Diagnosis not present

## 2022-02-13 DIAGNOSIS — Z803 Family history of malignant neoplasm of breast: Secondary | ICD-10-CM | POA: Insufficient documentation

## 2022-02-13 DIAGNOSIS — C3490 Malignant neoplasm of unspecified part of unspecified bronchus or lung: Secondary | ICD-10-CM

## 2022-02-13 DIAGNOSIS — Z87891 Personal history of nicotine dependence: Secondary | ICD-10-CM | POA: Insufficient documentation

## 2022-02-13 DIAGNOSIS — Z79899 Other long term (current) drug therapy: Secondary | ICD-10-CM | POA: Insufficient documentation

## 2022-02-13 DIAGNOSIS — Z5111 Encounter for antineoplastic chemotherapy: Secondary | ICD-10-CM

## 2022-02-13 DIAGNOSIS — F17200 Nicotine dependence, unspecified, uncomplicated: Secondary | ICD-10-CM | POA: Diagnosis not present

## 2022-02-13 DIAGNOSIS — G35 Multiple sclerosis: Secondary | ICD-10-CM | POA: Diagnosis not present

## 2022-02-13 DIAGNOSIS — M79604 Pain in right leg: Secondary | ICD-10-CM | POA: Insufficient documentation

## 2022-02-13 DIAGNOSIS — R222 Localized swelling, mass and lump, trunk: Secondary | ICD-10-CM | POA: Insufficient documentation

## 2022-02-13 DIAGNOSIS — C7951 Secondary malignant neoplasm of bone: Secondary | ICD-10-CM | POA: Diagnosis not present

## 2022-02-13 DIAGNOSIS — R590 Localized enlarged lymph nodes: Secondary | ICD-10-CM | POA: Diagnosis not present

## 2022-02-13 DIAGNOSIS — I3139 Other pericardial effusion (noninflammatory): Secondary | ICD-10-CM | POA: Insufficient documentation

## 2022-02-13 DIAGNOSIS — Z800 Family history of malignant neoplasm of digestive organs: Secondary | ICD-10-CM | POA: Insufficient documentation

## 2022-02-13 DIAGNOSIS — R221 Localized swelling, mass and lump, neck: Secondary | ICD-10-CM | POA: Diagnosis not present

## 2022-02-13 DIAGNOSIS — C782 Secondary malignant neoplasm of pleura: Secondary | ICD-10-CM | POA: Diagnosis not present

## 2022-02-13 DIAGNOSIS — R000 Tachycardia, unspecified: Secondary | ICD-10-CM

## 2022-02-13 DIAGNOSIS — K219 Gastro-esophageal reflux disease without esophagitis: Secondary | ICD-10-CM | POA: Insufficient documentation

## 2022-02-13 DIAGNOSIS — Z8249 Family history of ischemic heart disease and other diseases of the circulatory system: Secondary | ICD-10-CM | POA: Insufficient documentation

## 2022-02-13 DIAGNOSIS — C3411 Malignant neoplasm of upper lobe, right bronchus or lung: Secondary | ICD-10-CM | POA: Insufficient documentation

## 2022-02-13 LAB — COMPREHENSIVE METABOLIC PANEL
ALT: 18 U/L (ref 0–44)
AST: 20 U/L (ref 15–41)
Albumin: 3.2 g/dL — ABNORMAL LOW (ref 3.5–5.0)
Alkaline Phosphatase: 70 U/L (ref 38–126)
Anion gap: 6 (ref 5–15)
BUN: 15 mg/dL (ref 8–23)
CO2: 28 mmol/L (ref 22–32)
Calcium: 9.1 mg/dL (ref 8.9–10.3)
Chloride: 102 mmol/L (ref 98–111)
Creatinine, Ser: 0.86 mg/dL (ref 0.44–1.00)
GFR, Estimated: >60 mL/min (ref >60)
Glucose, Bld: 100 mg/dL — ABNORMAL HIGH (ref 70–99)
Potassium: 4.1 mmol/L (ref 3.5–5.1)
Sodium: 136 mmol/L (ref 135–145)
Total Bilirubin: 0.1 mg/dL — ABNORMAL LOW (ref 0.3–1.2)
Total Protein: 7.5 g/dL (ref 6.5–8.1)

## 2022-02-13 LAB — CBC WITH DIFFERENTIAL/PLATELET
Abs Immature Granulocytes: 0.01 10*3/uL (ref 0.00–0.07)
Basophils Absolute: 0 10*3/uL (ref 0.0–0.1)
Basophils Relative: 0 %
Eosinophils Absolute: 0.1 10*3/uL (ref 0.0–0.5)
Eosinophils Relative: 1 %
HCT: 39.9 % (ref 36.0–46.0)
Hemoglobin: 12.5 g/dL (ref 12.0–15.0)
Immature Granulocytes: 0 %
Lymphocytes Relative: 13 %
Lymphs Abs: 0.6 10*3/uL — ABNORMAL LOW (ref 0.7–4.0)
MCH: 26.5 pg (ref 26.0–34.0)
MCHC: 31.3 g/dL (ref 30.0–36.0)
MCV: 84.5 fL (ref 80.0–100.0)
Monocytes Absolute: 0.4 10*3/uL (ref 0.1–1.0)
Monocytes Relative: 8 %
Neutro Abs: 3.6 10*3/uL (ref 1.7–7.7)
Neutrophils Relative %: 78 %
Platelets: 181 10*3/uL (ref 150–400)
RBC: 4.72 MIL/uL (ref 3.87–5.11)
RDW: 15.9 % — ABNORMAL HIGH (ref 11.5–15.5)
WBC: 4.6 10*3/uL (ref 4.0–10.5)
nRBC: 0 % (ref 0.0–0.2)

## 2022-02-13 MED ORDER — BENZONATATE 100 MG PO CAPS: ORAL_CAPSULE | ORAL | 2 refills | Status: DC

## 2022-02-13 MED ORDER — LUMAKRAS 120 MG PO TABS: 960.0000 mg | ORAL_TABLET | Freq: Every day | ORAL | 1 refills | Status: DC

## 2022-02-13 NOTE — Progress Notes (Signed)
Hematology/Oncology Follow Up Note [phone] [phone]  Patient Care Team: Pcp, No as PCP - General Telford Nab, RN as Oncology Nurse Navigator [name], MD as Medical Oncologist (Oncology)   Name of the patient: Brittney Fisher  [id number]  1961-04-28   REASON FOR VISIT follow-up for lung cancer  PERTINENT ONCOLOGY HISTORY 61 y.o. female with past medical history including GERD, COPD, current everyday smoker presents for follow-up of lung mass and pleural effusion. 10/20/2020, patient was brought to ED via EMS due to generalized weakness, dizziness, chest pain shortness of breath.  She was recently diagnosed with COPD approximately 1 month ago by primary care provider.  Also unintentional weight loss during the last few months. Image work-up showed right-sided pleural effusion.  She underwent right thoracentesis.  With removal of 2.4 L of fluid. 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  Metastatic disease to low cervical/thoracic nodes, lung, right pleural space and bones.  Moderate to large right pleural effusion.  SVC narrowing. 10/21/2020 MRI brain is negative for metastasis.  Mild chronic microvascular ischemic changes in the white matter.  Negative for acute infarct Regarding to the SVC narrowing, patient was seen by vascular surgeon and was recommended no intervention inpatient.  Patient to follow-up outpatient with vascular surgeon for evaluation. Patient also was treated for COPD exacerbation with hypoxia on exertion. Qualifies for home oxygen and hospitalist arrange home health and a nebulizer.  Patient was given a course of prednisone taper and empiric Levaquin. Patient was discharged and present today to follow-up with cytology results and further management plan  10/27/2020 PET scan showed right upper lobe primary bronchogenic mass with direct invasion into  mediastinum. Metastatic disease to the right pleural space, lung, bone, nodes of the chest and less so lower leg/upper abdomen. Hypermetabolic him along the course of the right axillary vein with concurrent subtle hyper attenuation within the axillary vein and SVC.  This continues to the level of SVC narrowing.  Suspicious for SVC occlusion and developing thrombus. Moderate right pleural effusion and small pericardial effusion.  # # left supraclavicular mass biopsy- pathology is positive for adenocarcinoma. positive for CK7, with patchy, weak CK20.  They are  negative for TTF1, NapsinA, GATA3, CDX2, and Pax8.  The findings are  nonspecific; but may be compatible with a poorly differentiated  adenocarcinoma of lung origin, especially given the imaging findings  She also had another repeat thoracentesis and fluid cytology showed malignancy.  #NGS showed AXIN 1 [id number], DIS2 M172f, KRAS G12C, PRDM1 M1 [id number], [id number] MS stable, TPS <1%   11/14/2020- 12/12/2020.  #Bronchial obstruction and SVC occlusion.  s/p palliative radiation #After 2 cycles of chemtherapy, patient had CT scan done due to shortness of breath.  01/05/2021, CT chest angiogram PE protocol showed no evidence of pulmonary embolism.  Stable disease.  Right upper lobe mass about 6.7 x 5.3 cm.  Widespread metastatic disease, lung and bone involvement. Interval development of right-sided hydronephrosis. 03/06/2021 finished T2 palliative radiation 03/22/2021, 6 cycles of carboplatin/Alimta/bevacizumab.  Infusion reaction of carboplatin during cycle 6.  Carboplatin discontinued. 04/07/2021 CT chest abdomen pelvis-partial response Mild decrease in size of right lung mass, mildly decreased mediastinal and hilar adenopathy.  Extensive right-sided pleural metastasis unchanged.  Similar appearance of diffuse bilateral pulmonary nodules.  Multifocal lytic sclerotic bone metastasis.  New superior endplate deformity of T6 04/12/2021 -  09/08/2021, patient was continued on bevacizumab and Alimta.  09/21/2021, bone scan  showed multifocal abnormal uptake including mid to distal shaft of right femur, left femoral neck and trochanter, left sacroiliac region, left ischium.  Focal activity at the proximal tibia on the left.  Multi focal heterogeneous bilateral rib activity.  Focal activity at the sternum. 09/27/2021, CT chest abdomen pelvis with contrast showed progression of infrahilar left lower lobe with a concerning clearly progressive masslike consolidation opacity.  Mild progression of small left pleural effusion.  Similar experience of extensive irregular right-sided pleural disease.  Similar experience of bony metastasis.  09/29/2021 extra cycle of maintenance bevacizumab and Alimta while waiting for approval of Lumakras  10/20/2021 started on Lumakras  01/09/2022, CT chest abdomen pelvis with contrast showed no substantial interval changes in exam.  Stable disease.  INTERVAL HISTORY Brittney Fisher is a 61 y.o. female who has above history reviewed by me today presents for follow up visit for management of metastatic lung cancer, acute visit for swallowing pain.   Patient was accompanied by her daughter. Patient has been doing well clinically.  She takes Lumakras.  Tolerates well. Appetite is good and she has gained weight. Right lower extremity pain occasionally, she takes Tylenol with good relief of symptoms.    Review of Systems  Constitutional:  Negative for appetite change, chills, fatigue and fever.  HENT:   Negative for hearing loss and voice change.   Eyes:  Negative for eye problems.  Respiratory:  Negative for chest tightness, cough and shortness of breath.   Cardiovascular:  Negative for chest pain.  Gastrointestinal:  Negative for abdominal distention, abdominal pain and blood in stool.  Endocrine: Negative for hot flashes.  Genitourinary:  Negative for difficulty urinating and frequency.   Musculoskeletal:   Negative for arthralgias.  Skin:  Negative for itching and rash.  Neurological:  Negative for extremity weakness.  Hematological:  Negative for adenopathy.  Psychiatric/Behavioral:  Negative for confusion.      Allergies  Allergen Reactions   Carboplatin Shortness Of Breath, Itching and Cough     Past Medical History:  Diagnosis Date   COPD (chronic obstructive pulmonary disease) (HCC)    GERD (gastroesophageal reflux disease)    Metastatic lung cancer (metastasis from lung to other site) ([place]) 11/02/2020     Past Surgical History:  Procedure Laterality Date   COLONOSCOPY WITH ESOPHAGOGASTRODUODENOSCOPY (EGD)     COLONOSCOPY WITH PROPOFOL N/A 11/14/2015   Procedure: COLONOSCOPY WITH PROPOFOL;  Surgeon: Hulen Luster, MD;  Location: [facility] ENDOSCOPY;  Service: Gastroenterology;  Laterality: N/A;   ESOPHAGOGASTRODUODENOSCOPY (EGD) WITH PROPOFOL N/A 11/14/2015   Procedure: ESOPHAGOGASTRODUODENOSCOPY (EGD) WITH PROPOFOL;  Surgeon: Hulen Luster, MD;  Location: [facility] ENDOSCOPY;  Service: Gastroenterology;  Laterality: N/A;    Social History   Socioeconomic History   Marital status: Married    Spouse name: Not on file   Number of children: Not on file   Years of education: Not on file   Highest education level: Not on file  Occupational History   Not on file  Tobacco Use   Smoking status: Former    Types: Cigarettes    Quit date: 09/2021    Years since quitting: 0.4   Smokeless tobacco: Never  Vaping Use   Vaping Use: Never used  Substance and Sexual Activity   Alcohol use: Never   Drug use: Never   Sexual activity: Not Currently  Other Topics Concern   Not on file  Social History Narrative   Not on file   Social Determinants of Health  Financial Resource Strain: Not on file  Food Insecurity: Not on file  Transportation Needs: Not on file  Physical Activity: Not on file  Stress: Not on file  Social Connections: Not on file  Intimate Partner Violence: Not on file     Family History  Problem Relation Age of Onset   Breast cancer Cousin 42   Diabetes type II Mother    Hypertension Mother    Colon cancer Father    Hypertension Father      Current Outpatient Medications:    acetaminophen (TYLENOL) 500 MG tablet, Take 1,000 mg by mouth every 8 (eight) hours as needed for moderate pain. , Disp: , Rfl:    calcium carbonate (TUMS - DOSED IN MG ELEMENTAL CALCIUM) 500 MG chewable tablet, Chew 1 tablet by mouth as needed., Disp: , Rfl:    guaiFENesin (MUCINEX) 600 MG 12 hr tablet, Take 1 tablet (600 mg total) by mouth 2 (two) times daily., Disp: 14 tablet, Rfl: 0   ipratropium-albuterol (DUONEB) 0.5-2.5 (3) MG/3ML SOLN, Take 3 mLs by nebulization 3 (three) times daily., Disp: 360 mL, Rfl: 1   ondansetron (ZOFRAN) 8 MG tablet, Take 1 tablet (8 mg total) by mouth 2 (two) times daily as needed for refractory nausea / vomiting. Start on day 3 after chemo., Disp: 30 tablet, Rfl: 1   oxyCODONE (OXY IR/ROXICODONE) 5 MG immediate release tablet, Take 1 tablet (5 mg total) by mouth every 6 (six) hours as needed for moderate pain or severe pain., Disp: 120 tablet, Rfl: 0   Polyethylene Glycol 3350 (MIRALAX PO), Take by mouth daily as needed., Disp: , Rfl:    prochlorperazine (COMPAZINE) 10 MG tablet, Take 1 tablet (10 mg total) by mouth every 6 (six) hours as needed (Nausea or vomiting)., Disp: 30 tablet, Rfl: 1   protein supplement shake (PREMIER PROTEIN) LIQD, Take 2 oz by mouth 4 (four) times daily., Disp: , Rfl:    sucralfate (CARAFATE) 1 GM/10ML suspension, Take 10 mLs (1 g total) by mouth in the morning, at noon, and at bedtime., Disp: 420 mL, Rfl: 0   benzonatate (TESSALON) 100 MG capsule, Take 1 capsule by mouth three times daily as needed for cough, Disp: 60 capsule, Rfl: 2   folic acid (FOLVITE) 1 MG tablet, Take 1 tablet (1 mg total) by mouth daily. Start 5-7 days before Alimta chemotherapy. Continue until 21 days after Alimta completed. (Patient not taking:  Reported on 02/13/2022), Disp: 100 tablet, Rfl: 3   magic mouthwash w/lidocaine SOLN, Take 5 mLs by mouth 4 (four) times daily as needed for mouth pain. Sig: Swish/Swallow 5-10 ml four times a day as needed. Dispense 480 ml. 1RF (Patient not taking: Reported on 02/13/2022), Disp: 480 mL, Rfl: 1   senna-docusate (SENOKOT-S) 8.6-50 MG tablet, Take 2 tablets by mouth daily. (Patient not taking: Reported on 10/26/2021), Disp: 60 tablet, Rfl: 3   sotorasib (LUMAKRAS) 120 MG tablet, Take 960 mg (8 tablets) by mouth daily., Disp: 240 tablet, Rfl: 1  Physical exam: ECOG 1 Vitals:   02/13/22 1056  BP: 109/72  Pulse: (!) 109  Temp: (!) 96.7 F (35.9 C)  SpO2: 96%  Weight: 161 lb 4.8 oz (73.2 kg)   Physical Exam Constitutional:      General: She is not in acute distress.    Comments: Thin built female walks independently  HENT:     Head: Normocephalic and atraumatic.  Eyes:     General: No scleral icterus. Cardiovascular:     Rate  and Rhythm: Normal rate and regular rhythm.     Heart sounds: Normal heart sounds.  Pulmonary:     Effort: Pulmonary effort is normal. No respiratory distress.     Breath sounds: No wheezing.     Comments: Slightly decreased breath sound bilaterally.  Abdominal:     General: Bowel sounds are normal. There is no distension.     Palpations: Abdomen is soft.  Musculoskeletal:        General: No deformity. Normal range of motion.     Cervical back: Normal range of motion and neck supple.  Skin:    General: Skin is warm and dry.     Findings: No erythema or rash.  Neurological:     Mental Status: She is alert and oriented to person, place, and time. Mental status is at baseline.     Cranial Nerves: No cranial nerve deficit.     Coordination: Coordination normal.  Psychiatric:        Mood and Affect: Mood normal.    CMP Latest Ref Rng & Units 02/13/2022  Glucose 70 - 99 mg/dL 100(H)  BUN 8 - 23 mg/dL 15  Creatinine 0.44 - 1.00 mg/dL 0.86  Sodium 135 - 145  mmol/L 136  Potassium 3.5 - 5.1 mmol/L 4.1  Chloride 98 - 111 mmol/L 102  CO2 22 - 32 mmol/L 28  Calcium 8.9 - 10.3 mg/dL 9.1  Total Protein 6.5 - 8.1 g/dL 7.5  Total Bilirubin 0.3 - 1.2 mg/dL 0.1(L)  Alkaline Phos 38 - 126 U/L 70  AST 15 - 41 U/L 20  ALT 0 - 44 U/L 18   CBC Latest Ref Rng & Units 02/13/2022  WBC 4.0 - 10.5 K/uL 4.6  Hemoglobin 12.0 - 15.0 g/dL 12.5  Hematocrit 36.0 - 46.0 % 39.9  Platelets 150 - 400 K/uL 181    RADIOGRAPHIC STUDIES: I have personally reviewed the radiological images as listed and agreed with the findings in the report. CT CHEST ABDOMEN PELVIS W CONTRAST  Result Date: 01/09/2022 CLINICAL DATA:  Lung cancer.  Restaging. EXAM: CT CHEST, ABDOMEN, AND PELVIS WITH CONTRAST TECHNIQUE: Multidetector CT imaging of the chest, abdomen and pelvis was performed following the standard protocol during bolus administration of intravenous contrast. RADIATION DOSE REDUCTION: This exam was performed according to the departmental dose-optimization program which includes automated exposure control, adjustment of the mA and/or kV according to patient size and/or use of iterative reconstruction technique. CONTRAST:  121mL OMNIPAQUE IOHEXOL 300 MG/ML  SOLN COMPARISON:  09/27/2021 FINDINGS: CT CHEST FINDINGS Cardiovascular: The heart size is normal. No substantial pericardial effusion. Mild atherosclerotic calcification is noted in the wall of the thoracic aorta. Mediastinum/Nodes: Abnormal confluent soft tissue in the right hilum and mediastinum is similar to prior. Previously not measured precarinal node is similar measuring 15 mm short axis today compared to 17 mm previously. The esophagus has normal imaging features. Lungs/Pleura: Collapse/consolidation in the central left lower lobe again noted. Masslike consolidative component measured previously at 6.7 x 3.8 cm is 5.7 x 3.0 cm today on image 44/2. The right parahilar consolidative airspace disease seen previously is similar  today. The nearly circumferential irregular pleural thickening involving the right hemithorax is similar to prior. Plaque-like lesion along the right medial pleura measured previously at 6.5 x 1.8 cm previously is not substantially changed at 6.9 x 1.8 cm today on image 35/2. 13 mm anterior right lung nodule on 95/3 is stable in the interval. Posterior right lower lobe nodule measured previously at  13 mm is 9 mm today on image 105/3. 12 mm bilobed left upper lobe nodule on 84/3 was 12 mm previously (remeasured). 8 mm paraspinal left lower lobe nodule on 01/04/3 was 8 mm previously (remeasured). Similar small bilateral pleural effusions. Musculoskeletal: Sclerotic thoracic spine lesions and rib lesions are stable. Stable appearance of the sclerotic lesion involving the manubrium. CT ABDOMEN PELVIS FINDINGS Hepatobiliary: No suspicious focal abnormality within the liver parenchyma. There is no evidence for gallstones, gallbladder wall thickening, or pericholecystic fluid. No intrahepatic or extrahepatic biliary dilation. Pancreas: No focal mass lesion. No dilatation of the main duct. No intraparenchymal cyst. No peripancreatic edema. Spleen: No splenomegaly. No focal mass lesion. Adrenals/Urinary Tract: No adrenal nodule or mass. Multiple bilateral renal cysts again noted No evidence for hydroureter. The urinary bladder appears normal for the degree of distention. Stomach/Bowel: Stomach is unremarkable. No gastric wall thickening. No evidence of outlet obstruction. Duodenum is normally positioned as is the ligament of Treitz. No small bowel wall thickening. No small bowel dilatation. The terminal ileum is normal. The appendix is not well visualized, but there is no edema or inflammation in the region of the cecum. No gross colonic mass. No colonic wall thickening. Vascular/Lymphatic: There is mild atherosclerotic calcification of the abdominal aorta without aneurysm. There is no gastrohepatic or hepatoduodenal  ligament lymphadenopathy. No retroperitoneal or mesenteric lymphadenopathy. No pelvic sidewall lymphadenopathy. Reproductive: The uterus is unremarkable.  There is no adnexal mass. Other: No intraperitoneal free fluid. Musculoskeletal: Numerous sclerotic bone metastases again noted lumbar spine and bony pelvis. Index lesion right iliac bone adjacent to the SI joint measures 14 mm today compared to 11 mm previously and is more densely sclerotic than before. Similarly, a 15 mm sclerotic posterior left iliac lesion on 93/2 is similar in size but more densely sclerotic on the current study. IMPRESSION: 1. No substantial interval change in exam. The masslike consolidative component in the central left lower lobe is similar to prior. The right parahilar consolidative airspace disease seen previously is not substantially changed. 2. Circumferential irregular pleural thickening involving the right hemithorax is not appreciably changed in the interval. 3. Multiple bilateral pulmonary nodules are similar to prior with no clear improvement or progression in the interval. 4. Numerous sclerotic bone metastases again noted. Some of these lesions are more densely sclerotic on the current exam suggesting interval healing. 5. Aortic Atherosclerosis (ICD10-I70.0). Electronically Signed   By: Misty Stanley M.D.   On: 01/09/2022 12:35       Assessment and plan  1. Primary malignant neoplasm of lung metastatic to other site, unspecified laterality ([place])   2. Tachycardia   3. Encounter for antineoplastic chemotherapy   4. 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  cT4 N3 M1-  KRASG12C mutation, currently on second line treatment with Lumakras [Patient has  high TMB, TPS less than 1%.  Immunotherapy in combination with chemotherapy will be other options in the future if lumakras is not effective] Labs reviewed and discussed with patient.  Continue Lumakras  Bone metastasis, pain has improved.  Status post palliative radiation to right lower extremity. Again we discussed about the dental clearance for bisphosphonate treatments.  Patient was encouraged to further discuss with dentist.  Tachycardia, this is  chronic for her.  Improved heart rate.  Encourage oral hydration.  Normal thyroid function.    Lab MD 4 weeks lab MD.  [name], MD, PhD [facility] Hematology Oncology 02/13/2022

## 2022-03-09 ENCOUNTER — Encounter: Payer: Self-pay | Admitting: Oncology

## 2022-03-09 DIAGNOSIS — M898X5 Other specified disorders of bone, thigh: Secondary | ICD-10-CM

## 2022-03-09 NOTE — Telephone Encounter (Signed)
Spoke with patient who states she is experiencing Right leg pain. Patient denies any swelling to the area. Per patient pain is like a toothache. Anytime she moves it hurts. Per Dr. [name], wants an Xray of right femur STAT. Advised to take Hydrocodone prn for pain if Tylenol doesn't help. Continue with heating pad as needed as well. Advised that we will schedule her with [facility] for next week.  ? ? ?Please schedule patient for: ?XRAY right femur STAT.  ?[place] next week. ?Please inform patient of appt. Thanks. ?

## 2022-03-09 NOTE — Telephone Encounter (Signed)
Spoke to patient. She states that she is not able to get the x-ray done today or this weekend, and it will probably be Monday before she can get it done. She did not want to schedule the follow-up in [facility] until after she completes the x-ray because she does not know when she will be able to get a ride. She stated that she will need to discuss with her POA. Pt stated that she would call us back after the obtained the x-ray to schedule an appointment.  ?

## 2022-03-09 NOTE — Telephone Encounter (Signed)
Please advise 

## 2022-03-12 ENCOUNTER — Ambulatory Visit
Admission: RE | Admit: 2022-03-12 | Discharge: 2022-03-12 | Disposition: A | Discharge status: Home / Self Care | Payer: BC Managed Care – PPO | Attending: Oncology | Admitting: Oncology

## 2022-03-12 ENCOUNTER — Ambulatory Visit
Admission: RE | Admit: 2022-03-12 | Discharge: 2022-03-12 | Disposition: A | Discharge status: Home / Self Care | Payer: BC Managed Care – PPO | Source: Ambulatory Visit | Attending: Oncology | Admitting: Oncology

## 2022-03-12 DIAGNOSIS — M898X5 Other specified disorders of bone, thigh: Secondary | ICD-10-CM | POA: Insufficient documentation

## 2022-03-14 ENCOUNTER — Telehealth: Payer: Self-pay

## 2022-03-14 ENCOUNTER — Ambulatory Visit: Payer: BC Managed Care – PPO | Admitting: Oncology

## 2022-03-14 ENCOUNTER — Other Ambulatory Visit: Payer: BC Managed Care – PPO

## 2022-03-14 NOTE — Telephone Encounter (Signed)
Called to inform patient of results. Patient did not answer. Left detailed message. I will also send MyChart message. Advised to call back if she is still experiencing pain in right leg.  ?

## 2022-03-20 ENCOUNTER — Other Ambulatory Visit: Payer: Self-pay

## 2022-03-20 ENCOUNTER — Ambulatory Visit
Admission: RE | Admit: 2022-03-20 | Discharge: 2022-03-20 | Disposition: A | Discharge status: Home / Self Care | Payer: BC Managed Care – PPO | Source: Ambulatory Visit | Attending: Oncology | Admitting: Oncology

## 2022-03-20 ENCOUNTER — Encounter: Payer: Self-pay | Admitting: Oncology

## 2022-03-20 ENCOUNTER — Inpatient Hospital Stay: Payer: BC Managed Care – PPO | Attending: Radiation Oncology | Admitting: Oncology

## 2022-03-20 ENCOUNTER — Inpatient Hospital Stay: Payer: BC Managed Care – PPO

## 2022-03-20 VITALS — BP 90/63 | HR 124 | Temp 98.9°F | Ht 71.0 in | Wt 158.0 lb

## 2022-03-20 DIAGNOSIS — Z79899 Other long term (current) drug therapy: Secondary | ICD-10-CM | POA: Insufficient documentation

## 2022-03-20 DIAGNOSIS — M79604 Pain in right leg: Secondary | ICD-10-CM | POA: Insufficient documentation

## 2022-03-20 DIAGNOSIS — Z8249 Family history of ischemic heart disease and other diseases of the circulatory system: Secondary | ICD-10-CM | POA: Diagnosis not present

## 2022-03-20 DIAGNOSIS — J449 Chronic obstructive pulmonary disease, unspecified: Secondary | ICD-10-CM | POA: Insufficient documentation

## 2022-03-20 DIAGNOSIS — Z87891 Personal history of nicotine dependence: Secondary | ICD-10-CM | POA: Insufficient documentation

## 2022-03-20 DIAGNOSIS — G35 Multiple sclerosis: Secondary | ICD-10-CM | POA: Diagnosis not present

## 2022-03-20 DIAGNOSIS — M898X5 Other specified disorders of bone, thigh: Secondary | ICD-10-CM | POA: Diagnosis not present

## 2022-03-20 DIAGNOSIS — C3411 Malignant neoplasm of upper lobe, right bronchus or lung: Secondary | ICD-10-CM | POA: Insufficient documentation

## 2022-03-20 DIAGNOSIS — C3490 Malignant neoplasm of unspecified part of unspecified bronchus or lung: Secondary | ICD-10-CM

## 2022-03-20 DIAGNOSIS — Z803 Family history of malignant neoplasm of breast: Secondary | ICD-10-CM | POA: Diagnosis not present

## 2022-03-20 DIAGNOSIS — I3139 Other pericardial effusion (noninflammatory): Secondary | ICD-10-CM | POA: Insufficient documentation

## 2022-03-20 DIAGNOSIS — Z800 Family history of malignant neoplasm of digestive organs: Secondary | ICD-10-CM | POA: Diagnosis not present

## 2022-03-20 DIAGNOSIS — G893 Neoplasm related pain (acute) (chronic): Secondary | ICD-10-CM | POA: Insufficient documentation

## 2022-03-20 DIAGNOSIS — K219 Gastro-esophageal reflux disease without esophagitis: Secondary | ICD-10-CM | POA: Diagnosis not present

## 2022-03-20 DIAGNOSIS — R300 Dysuria: Secondary | ICD-10-CM

## 2022-03-20 DIAGNOSIS — C782 Secondary malignant neoplasm of pleura: Secondary | ICD-10-CM | POA: Diagnosis not present

## 2022-03-20 DIAGNOSIS — R222 Localized swelling, mass and lump, trunk: Secondary | ICD-10-CM | POA: Insufficient documentation

## 2022-03-20 DIAGNOSIS — R000 Tachycardia, unspecified: Secondary | ICD-10-CM | POA: Diagnosis not present

## 2022-03-20 DIAGNOSIS — Z5111 Encounter for antineoplastic chemotherapy: Secondary | ICD-10-CM

## 2022-03-20 DIAGNOSIS — C7951 Secondary malignant neoplasm of bone: Secondary | ICD-10-CM | POA: Diagnosis present

## 2022-03-20 DIAGNOSIS — Z833 Family history of diabetes mellitus: Secondary | ICD-10-CM | POA: Insufficient documentation

## 2022-03-20 LAB — CBC WITH DIFFERENTIAL/PLATELET
Abs Immature Granulocytes: 0.02 10*3/uL (ref 0.00–0.07)
Basophils Absolute: 0 10*3/uL (ref 0.0–0.1)
Basophils Relative: 0 %
Eosinophils Absolute: 0 10*3/uL (ref 0.0–0.5)
Eosinophils Relative: 0 %
HCT: 40.9 % (ref 36.0–46.0)
Hemoglobin: 12.9 g/dL (ref 12.0–15.0)
Immature Granulocytes: 0 %
Lymphocytes Relative: 14 %
Lymphs Abs: 0.7 10*3/uL (ref 0.7–4.0)
MCH: 26.9 pg (ref 26.0–34.0)
MCHC: 31.5 g/dL (ref 30.0–36.0)
MCV: 85.4 fL (ref 80.0–100.0)
Monocytes Absolute: 0.6 10*3/uL (ref 0.1–1.0)
Monocytes Relative: 11 %
Neutro Abs: 3.7 10*3/uL (ref 1.7–7.7)
Neutrophils Relative %: 75 %
Platelets: 185 10*3/uL (ref 150–400)
RBC: 4.79 MIL/uL (ref 3.87–5.11)
RDW: 15.1 % (ref 11.5–15.5)
WBC: 5 10*3/uL (ref 4.0–10.5)
nRBC: 0 % (ref 0.0–0.2)

## 2022-03-20 LAB — URINALYSIS, COMPLETE (UACMP) WITH MICROSCOPIC
Bilirubin Urine: NEGATIVE
Glucose, UA: NEGATIVE mg/dL
Hgb urine dipstick: NEGATIVE
Ketones, ur: NEGATIVE mg/dL
Nitrite: NEGATIVE
Protein, ur: NEGATIVE mg/dL
Specific Gravity, Urine: 1.028 (ref 1.005–1.030)
pH: 5 (ref 5.0–8.0)

## 2022-03-20 LAB — COMPREHENSIVE METABOLIC PANEL
ALT: 15 U/L (ref 0–44)
AST: 19 U/L (ref 15–41)
Albumin: 3.3 g/dL — ABNORMAL LOW (ref 3.5–5.0)
Alkaline Phosphatase: 70 U/L (ref 38–126)
Anion gap: 5 (ref 5–15)
BUN: 28 mg/dL — ABNORMAL HIGH (ref 8–23)
CO2: 26 mmol/L (ref 22–32)
Calcium: 8.6 mg/dL — ABNORMAL LOW (ref 8.9–10.3)
Chloride: 104 mmol/L (ref 98–111)
Creatinine, Ser: 0.89 mg/dL (ref 0.44–1.00)
GFR, Estimated: >60 mL/min (ref >60)
Glucose, Bld: 77 mg/dL (ref 70–99)
Potassium: 3.6 mmol/L (ref 3.5–5.1)
Sodium: 135 mmol/L (ref 135–145)
Total Bilirubin: 0.3 mg/dL (ref 0.3–1.2)
Total Protein: 7.6 g/dL (ref 6.5–8.1)

## 2022-03-20 MED ORDER — IOHEXOL 300 MG/ML  SOLN
100.0000 mL | Freq: Once | INTRAMUSCULAR | Status: AC | PRN
  Administered 2022-03-20: 100 mL via INTRAVENOUS

## 2022-03-20 MED ORDER — OXYCODONE HCL 5 MG PO TABS
5.0000 mg | ORAL_TABLET | Freq: Four times a day (QID) | ORAL | 0 refills | Status: DC | PRN
Start: 2022-03-20 — End: 2022-04-16

## 2022-03-20 NOTE — Progress Notes (Signed)
? ? ?Hematology/Oncology Progress note ?Telephone:(336) [phone] [phone] ?  ? ? ?Patient Care Team: ?Pcp, No as PCP - General ?Telford Nab, RN as Oncology Nurse Navigator ?[name], MD as Medical Oncologist (Oncology)  ? ?Name of the patient: Brittney Fisher  ?[id number]  ?[birth year]  ? ?REASON FOR VISIT ?follow-up for lung cancer ? ?PERTINENT ONCOLOGY HISTORY ?61 y.o. female with past medical history including GERD, COPD, current everyday smoker presents for follow-up of lung mass and pleural effusion. ?10/20/2020, patient was brought to ED via EMS due to generalized weakness, dizziness, chest pain shortness of breath.  She was recently diagnosed with COPD approximately 1 month ago by primary care provider.  Also unintentional weight loss during the last few months. ?Image work-up showed right-sided pleural effusion.  She underwent right thoracentesis.  With removal of 2.4 L of fluid. ?10/18/2020, CT chest with contrast showed central right upper lobe pulmonary bronchogenic carcinoma with direct mediastinal invasion.  Metastatic disease to low cervical/thoracic nodes, lung, right pleural space and bones.  Moderate to large right pleural effusion.  SVC narrowing. ?10/21/2020 MRI brain is negative for metastasis.  Mild chronic microvascular ischemic changes in the white matter.  Negative for acute infarct ?Regarding to the SVC narrowing, patient was seen by vascular surgeon and was recommended no intervention inpatient.  Patient to follow-up outpatient with vascular surgeon for evaluation. ?Patient also was treated for COPD exacerbation with hypoxia on exertion. ?Qualifies for home oxygen and hospitalist arrange home health and a nebulizer.  Patient was given a course of prednisone taper and empiric Levaquin. ?Patient was discharged and present today to follow-up with cytology results and further management plan ? ?10/27/2020 PET scan showed right upper lobe primary bronchogenic mass with direct invasion into  mediastinum. ?Metastatic disease to the right pleural space, lung, bone, nodes of the chest and less so lower leg/upper abdomen. ?Hypermetabolic him along the course of the right axillary vein with concurrent subtle hyper attenuation within the axillary vein and SVC.  This continues to the level of SVC narrowing.  Suspicious for SVC occlusion and developing thrombus. ?Moderate right pleural effusion and small pericardial effusion. ? 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 positive for CK7, with patchy, weak CK20.  They are  negative for TTF1, NapsinA, GATA3, CDX2, and Pax8.  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 ?She also had another repeat thoracentesis and fluid cytology showed malignancy.  ?#NGS showed AXIN 1 G4255, DIS2 M165f, KRAS G12C, PRDM1 M1 [id number], [id number] MS stable, TPS <1%  ? ?11/14/2020- 12/12/2020.  ?#Bronchial obstruction and SVC occlusion.  s/p palliative radiation ?#After 2 cycles of chemtherapy, patient had CT scan done due to shortness of breath.  01/05/2021, CT chest angiogram PE protocol showed no evidence of pulmonary embolism.  Stable disease.  Right upper lobe mass about 6.7 x 5.3 cm.  Widespread metastatic disease, lung and bone involvement. ?Interval development of right-sided hydronephrosis. ?03/06/2021 finished T2 palliative radiation ?03/22/2021, 6 cycles of carboplatin/Alimta/bevacizumab.  Infusion reaction of carboplatin during cycle 6.  Carboplatin discontinued. ?04/07/2021 CT chest abdomen pelvis-partial response ?Mild decrease in size of right lung mass, mildly decreased mediastinal and hilar adenopathy.  Extensive right-sided pleural metastasis unchanged.  Similar appearance of diffuse bilateral pulmonary nodules.  Multifocal lytic sclerotic bone metastasis.  New superior endplate deformity of T6 ?04/12/2021 -  09/08/2021, patient was continued on bevacizumab and Alimta. ? ?09/21/2021,  bone scan showed multifocal abnormal uptake including mid to distal shaft of right femur, left femoral neck and trochanter, left sacroiliac region, left ischium.  Focal activity at the proximal tibia on the left.  Multi focal heterogeneous bilateral rib activity.  Focal activity at the sternum. ?09/27/2021, CT chest abdomen pelvis with contrast showed progression of infrahilar left lower lobe with a concerning clearly progressive masslike consolidation opacity.  Mild progression of small left pleural effusion.  Similar experience of extensive irregular right-sided pleural disease.  Similar experience of bony metastasis.  ?09/29/2021 extra cycle of maintenance bevacizumab and Alimta while waiting for approval of Lumakras ? ?10/20/2021 started on Lumakras  ?01/09/2022, CT chest abdomen pelvis with contrast showed no substantial interval changes in exam.  Stable disease. ? ?INTERVAL HISTORY ?Brittney Fisher is a 61 y.o. female who has above history reviewed by me today presents for follow up visit for management of metastatic lung cancer, acute visit for swallowing pain.  ? ?Patient was accompanied by her sister. ?During the interval, patient has experienced vaginal extremity pain.  She stopped taking Lumakras since 3 days ago, as she thinks that it is the cancer.  That caused her to have pain.  She has not experienced pain since then. ?She takes Tylenol and oxycodone for pain relief. ?Reports frequent urination.  No fever or chills. ? ? Review of Systems  ?Constitutional:  Negative for appetite change, chills, fatigue and fever.  ?HENT:   Negative for hearing loss and voice change.   ?Eyes:  Negative for eye problems.  ?Respiratory:  Negative for chest tightness, cough and shortness of breath.   ?Cardiovascular:  Negative for chest pain.  ?Gastrointestinal:  Negative for abdominal distention, abdominal pain and blood in stool.  ?Endocrine:  Negative for hot flashes.  ?Genitourinary:  Negative for difficulty urinating and frequency.   ?Musculoskeletal:  Negative for arthralgias.  ?     Right lower extremity pain  ?Skin:  Negative for itching and rash.  ?Neurological:  Negative for extremity weakness.  ?Hematological:  Negative for adenopathy.  ?Psychiatric/Behavioral:  Negative for confusion.    ? ? ?Allergies  ?Allergen Reactions  ? Carboplatin Shortness Of Breath, Itching and Cough  ? ? ? ?Past Medical History:  ?Diagnosis Date  ? COPD (chronic obstructive pulmonary disease) ([place])   ? GERD (gastroesophageal reflux disease)   ? Metastatic lung cancer (metastasis from lung to other site) [facility]) 11/02/2020  ? ? ? ?Past Surgical History:  ?Procedure Laterality Date  ? COLONOSCOPY WITH ESOPHAGOGASTRODUODENOSCOPY (EGD)    ? COLONOSCOPY WITH PROPOFOL N/A 11/14/2015  ? Procedure: COLONOSCOPY WITH PROPOFOL;  Surgeon: Hulen Luster, MD;  Location[facility] ENDOSCOPY;  Service: Gastroenterology;  Laterality: N/A;  ? ESOPHAGOGASTRODUODENOSCOPY (EGD) WITH PROPOFOL N/A 11/14/2015  ? Procedure: ESOPHAGOGASTRODUODENOSCOPY (EGD) WITH PROPOFOL;  Surgeon: Hulen Luster, MD;  Location: [facility] ENDOSCOPY;  Service: Gastroenterology;  Laterality: N/A;  ? ? ?Social History  ? ?Socioeconomic History  ? Marital status: Married  ?  Spouse name: Not on file  ? Number of children: Not on file  ? Years of education: Not on file  ? Highest education level: Not on file  ?Occupational History  ? Not on file  ?Tobacco Use  ? Smoking status: Former  ?  Types: Cigarettes  ?  Quit date: 09/2021  ?  Years since quitting: 0.5  ? Smokeless tobacco: Never  ?Vaping Use  ? Vaping Use: Never used  ?Substance and Sexual Activity  ? Alcohol use: Never  ?  Drug use: Never  ? Sexual activity: Not Currently  ?Other Topics Concern  ? Not on file  ?Social History Narrative  ? Not on file  ? ?Social Determinants of Health  ? ?Financial Resource Strain: Not on file  ?Food Insecurity: Not on file  ?Transportation Needs:  Not on file  ?Physical Activity: Not on file  ?Stress: Not on file  ?Social Connections: Not on file  ?Intimate Partner Violence: Not on file  ? ? ?Family History  ?Problem Relation Age of Onset  ? Breast cancer

## 2022-03-22 LAB — URINE CULTURE: Culture: <10000 — AB

## 2022-03-26 ENCOUNTER — Telehealth: Payer: Self-pay

## 2022-03-26 DIAGNOSIS — C3490 Malignant neoplasm of unspecified part of unspecified bronchus or lung: Secondary | ICD-10-CM

## 2022-03-26 NOTE — Telephone Encounter (Signed)
Please schedule patient for Lab/MD in 4 weeks. Please notify patient of appt. Patient aware of plan.  ?

## 2022-03-26 NOTE — Telephone Encounter (Signed)
error 

## 2022-03-26 NOTE — Telephone Encounter (Signed)
-----   Message from [name], MD sent at 03/25/2022 11:39 AM EDT ----- ?Please let her know that no UTI, encourage her to increase oral hydration.  ?CT scan showed her cancer is stable, no significant progression.  ?Please follow up on how is her right leg pain, and if she has resumed on her cancer medication.  ?Follow up in 4 weeks, lab md cbc cmp MD.  ?

## 2022-03-29 ENCOUNTER — Ambulatory Visit: Payer: BC Managed Care – PPO | Admitting: Oncology

## 2022-03-29 ENCOUNTER — Other Ambulatory Visit: Payer: BC Managed Care – PPO

## 2022-03-30 ENCOUNTER — Telehealth: Payer: Self-pay

## 2022-03-30 ENCOUNTER — Other Ambulatory Visit: Payer: Self-pay | Admitting: Oncology

## 2022-03-30 ENCOUNTER — Telehealth: Payer: Self-pay | Admitting: *Deleted

## 2022-03-30 ENCOUNTER — Encounter: Payer: Self-pay | Admitting: Oncology

## 2022-03-30 MED ORDER — FENTANYL 25 MCG/HR TD PT72: 1.0000 | MEDICATED_PATCH | TRANSDERMAL | 0 refills | Status: DC

## 2022-03-30 NOTE — Telephone Encounter (Signed)
This has been handled through My CHart message regarding pain ?

## 2022-03-30 NOTE — Telephone Encounter (Signed)
Spoke to [facility] and informed that Dr. [name] will be prescribing Fentanyl patches every 72 hours to help with leg and back pain.  ?

## 2022-04-02 ENCOUNTER — Encounter: Payer: Self-pay | Admitting: Radiation Oncology

## 2022-04-02 ENCOUNTER — Ambulatory Visit
Admission: RE | Admit: 2022-04-02 | Discharge: 2022-04-02 | Disposition: A | Discharge status: Home / Self Care | Payer: BC Managed Care – PPO | Source: Ambulatory Visit | Attending: Radiation Oncology | Admitting: Radiation Oncology

## 2022-04-02 VITALS — BP 107/62 | HR 113 | Temp 98.4°F | Resp 16 | Ht 71.0 in | Wt 157.4 lb

## 2022-04-02 DIAGNOSIS — C7951 Secondary malignant neoplasm of bone: Secondary | ICD-10-CM | POA: Insufficient documentation

## 2022-04-02 DIAGNOSIS — Z923 Personal history of irradiation: Secondary | ICD-10-CM | POA: Insufficient documentation

## 2022-04-02 DIAGNOSIS — C3411 Malignant neoplasm of upper lobe, right bronchus or lung: Secondary | ICD-10-CM | POA: Diagnosis not present

## 2022-04-02 DIAGNOSIS — C3490 Malignant neoplasm of unspecified part of unspecified bronchus or lung: Secondary | ICD-10-CM

## 2022-04-02 NOTE — Progress Notes (Signed)
Radiation Oncology ?Follow up Note old patient new area right hip ? ?Name: Brittney Fisher   ?Date:   04/02/2022 ?MRN:  [id number] ?DOB: 1961-08-12  ? ? ?This 61 y.o. female presents to the clinic today for evaluation of her right hip.  In patient with known stage IV adenocarcinoma of the lung ? ?REFERRING PROVIDER: No ref. provider found ? ?HPI: Patient is a 61 year old female well-known to our department having received palliative radiation therapy to her thoracic spine as well as her chest initially for SVC from known adenocarcinoma.  We also treated her left SI joint as well as her left hip for metastatic disease which has had excellent palliative benefit..  She is currently on second line treatment with Lumakras for which she is tolerating that well.  She had recent CT scan of her chest abdomen pelvis showing unchanged circumferential pleural thickening and pleural nodularity of the right hemithorax and bilateral pulmonary nodules.  She also has stable right perihilar consolidation likely postradiation change.  She has been having right hip pain and recent plain films show sclerotic and lucent lesions noted in the femur concerning for metastatic disease.  No acute fracture or dislocation is noted.  Her pain sounds like it is radicular in nature although no evidence of of disease noted in her lower lumbar spine on previous bone scan is noted.  She is ambulating well.  She is seen today for consideration of palliative radiation therapy to her right femur. ? ?COMPLICATIONS OF TREATMENT: none ? ?FOLLOW UP COMPLIANCE: keeps appointments  ? ?PHYSICAL EXAM:  ?BP 107/62 (BP Location: Left Arm, Patient Position: Sitting)   Pulse (!) 113   Temp 98.4 ?F (36.9 ?C) (Tympanic)   Resp 16   Ht 5\' 11"  (1.803 m)   Wt 157 lb 6.4 oz (71.4 kg)   BMI 21.95 kg/m?  ?Range of motion of the lower extremities does not elicit pain motor or sensory and DTR levels are equal and symmetric in the lower extremities.  Well-developed  well-nourished patient in NAD. HEENT reveals PERLA, EOMI, discs not visualized.  Oral cavity is clear. No oral mucosal lesions are identified. Neck is clear without evidence of cervical or supraclavicular adenopathy. Lungs are clear to A&P. Cardiac examination is essentially unremarkable with regular rate and rhythm without murmur rub or thrill. Abdomen is benign with no organomegaly or masses noted. Motor sensory and DTR levels are equal and symmetric in the upper and lower extremities. Cranial nerves II through XII are grossly intact. Proprioception is intact. No peripheral adenopathy or edema is identified. No motor or sensory levels are noted. Crude visual fields are within normal range. ? ?RADIOLOGY RESULTS: Plain films of her right femur and hip as well as CT scans and bone scans all reviewed compatible with above-stated findings ? ?PLAN: Present time elected ahead with palliative radiation therapy to her right hip and femur.  We will plan on delivering 30 Gray in 10 fractions.  Risks and benefits of treatment occluding skin reaction possible fatigue all were discussed in detail with the patient.  We will try to incorporate as much as of the lesions in her femur as possible.  Patient comprehends my recommendations well if personally set up and ordered CT simulation for later this week. ? ?I would like to take this opportunity to thank you for allowing me to participate in the care of your patient.. ?  ? Noreene Filbert, MD ? ?

## 2022-04-04 ENCOUNTER — Ambulatory Visit
Admission: RE | Admit: 2022-04-04 | Discharge: 2022-04-04 | Disposition: A | Discharge status: Home / Self Care | Payer: BC Managed Care – PPO | Source: Ambulatory Visit | Attending: Radiation Oncology | Admitting: Radiation Oncology

## 2022-04-04 DIAGNOSIS — C3490 Malignant neoplasm of unspecified part of unspecified bronchus or lung: Secondary | ICD-10-CM | POA: Diagnosis present

## 2022-04-04 DIAGNOSIS — C7951 Secondary malignant neoplasm of bone: Secondary | ICD-10-CM | POA: Insufficient documentation

## 2022-04-05 DIAGNOSIS — C7951 Secondary malignant neoplasm of bone: Secondary | ICD-10-CM | POA: Diagnosis not present

## 2022-04-09 ENCOUNTER — Ambulatory Visit: Admission: RE | Admit: 2022-04-09 | Payer: BC Managed Care – PPO | Source: Ambulatory Visit

## 2022-04-09 ENCOUNTER — Other Ambulatory Visit: Payer: Self-pay | Admitting: *Deleted

## 2022-04-09 DIAGNOSIS — C7951 Secondary malignant neoplasm of bone: Secondary | ICD-10-CM

## 2022-04-10 ENCOUNTER — Ambulatory Visit
Admission: RE | Admit: 2022-04-10 | Discharge: 2022-04-10 | Disposition: A | Discharge status: Home / Self Care | Payer: BC Managed Care – PPO | Source: Ambulatory Visit | Attending: Radiation Oncology | Admitting: Radiation Oncology

## 2022-04-10 ENCOUNTER — Other Ambulatory Visit: Payer: Self-pay

## 2022-04-10 ENCOUNTER — Ambulatory Visit: Payer: BC Managed Care – PPO

## 2022-04-10 DIAGNOSIS — C7951 Secondary malignant neoplasm of bone: Secondary | ICD-10-CM | POA: Diagnosis not present

## 2022-04-10 LAB — RAD ONC ARIA SESSION SUMMARY
Course Elapsed Days: 0
Plan Fractions Treated to Date: 1
Plan Prescribed Dose Per Fraction: 3 Gy
Plan Total Fractions Prescribed: 10
Plan Total Prescribed Dose: 30 Gy
Reference Point Dosage Given to Date: 3 Gy
Reference Point Session Dosage Given: 3 Gy
Session Number: 1

## 2022-04-11 ENCOUNTER — Other Ambulatory Visit: Payer: Self-pay

## 2022-04-11 ENCOUNTER — Ambulatory Visit: Payer: BC Managed Care – PPO

## 2022-04-11 ENCOUNTER — Ambulatory Visit
Admission: RE | Admit: 2022-04-11 | Discharge: 2022-04-11 | Disposition: A | Discharge status: Home / Self Care | Payer: BC Managed Care – PPO | Source: Ambulatory Visit | Attending: Radiation Oncology | Admitting: Radiation Oncology

## 2022-04-11 DIAGNOSIS — C7951 Secondary malignant neoplasm of bone: Secondary | ICD-10-CM | POA: Diagnosis not present

## 2022-04-11 LAB — RAD ONC ARIA SESSION SUMMARY
Course Elapsed Days: 1
Plan Fractions Treated to Date: 2
Plan Prescribed Dose Per Fraction: 3 Gy
Plan Total Fractions Prescribed: 10
Plan Total Prescribed Dose: 30 Gy
Reference Point Dosage Given to Date: 6 Gy
Reference Point Session Dosage Given: 3 Gy
Session Number: 2

## 2022-04-12 ENCOUNTER — Ambulatory Visit
Admission: RE | Admit: 2022-04-12 | Discharge: 2022-04-12 | Disposition: A | Discharge status: Home / Self Care | Payer: BC Managed Care – PPO | Source: Ambulatory Visit | Attending: Radiation Oncology | Admitting: Radiation Oncology

## 2022-04-12 ENCOUNTER — Ambulatory Visit: Payer: BC Managed Care – PPO

## 2022-04-12 ENCOUNTER — Other Ambulatory Visit: Payer: Self-pay

## 2022-04-12 DIAGNOSIS — C7951 Secondary malignant neoplasm of bone: Secondary | ICD-10-CM | POA: Diagnosis not present

## 2022-04-12 LAB — RAD ONC ARIA SESSION SUMMARY
Course Elapsed Days: 2
Plan Fractions Treated to Date: 3
Plan Prescribed Dose Per Fraction: 3 Gy
Plan Total Fractions Prescribed: 10
Plan Total Prescribed Dose: 30 Gy
Reference Point Dosage Given to Date: 9 Gy
Reference Point Session Dosage Given: 3 Gy
Session Number: 3

## 2022-04-13 ENCOUNTER — Other Ambulatory Visit: Payer: Self-pay

## 2022-04-13 ENCOUNTER — Ambulatory Visit
Admission: RE | Admit: 2022-04-13 | Discharge: 2022-04-13 | Disposition: A | Discharge status: Home / Self Care | Payer: BC Managed Care – PPO | Source: Ambulatory Visit | Attending: Radiation Oncology | Admitting: Radiation Oncology

## 2022-04-13 ENCOUNTER — Ambulatory Visit: Payer: BC Managed Care – PPO

## 2022-04-13 DIAGNOSIS — C7951 Secondary malignant neoplasm of bone: Secondary | ICD-10-CM | POA: Diagnosis not present

## 2022-04-13 LAB — RAD ONC ARIA SESSION SUMMARY
Course Elapsed Days: 3
Plan Fractions Treated to Date: 4
Plan Prescribed Dose Per Fraction: 3 Gy
Plan Total Fractions Prescribed: 10
Plan Total Prescribed Dose: 30 Gy
Reference Point Dosage Given to Date: 12 Gy
Reference Point Session Dosage Given: 3 Gy
Session Number: 4

## 2022-04-16 ENCOUNTER — Other Ambulatory Visit: Payer: Self-pay | Admitting: Oncology

## 2022-04-16 ENCOUNTER — Ambulatory Visit
Admission: RE | Admit: 2022-04-16 | Discharge: 2022-04-16 | Disposition: A | Discharge status: Home / Self Care | Payer: Medicaid Other | Source: Ambulatory Visit | Attending: Radiation Oncology | Admitting: Radiation Oncology

## 2022-04-16 ENCOUNTER — Ambulatory Visit: Payer: Medicaid Other

## 2022-04-16 ENCOUNTER — Other Ambulatory Visit: Payer: Self-pay | Admitting: *Deleted

## 2022-04-16 ENCOUNTER — Encounter: Payer: Self-pay | Admitting: Oncology

## 2022-04-16 ENCOUNTER — Other Ambulatory Visit: Payer: Self-pay

## 2022-04-16 ENCOUNTER — Inpatient Hospital Stay: Payer: Medicaid Other

## 2022-04-16 DIAGNOSIS — Z8249 Family history of ischemic heart disease and other diseases of the circulatory system: Secondary | ICD-10-CM | POA: Insufficient documentation

## 2022-04-16 DIAGNOSIS — C3411 Malignant neoplasm of upper lobe, right bronchus or lung: Secondary | ICD-10-CM | POA: Insufficient documentation

## 2022-04-16 DIAGNOSIS — Z87891 Personal history of nicotine dependence: Secondary | ICD-10-CM | POA: Insufficient documentation

## 2022-04-16 DIAGNOSIS — Z800 Family history of malignant neoplasm of digestive organs: Secondary | ICD-10-CM | POA: Insufficient documentation

## 2022-04-16 DIAGNOSIS — C7951 Secondary malignant neoplasm of bone: Secondary | ICD-10-CM | POA: Insufficient documentation

## 2022-04-16 DIAGNOSIS — I3139 Other pericardial effusion (noninflammatory): Secondary | ICD-10-CM | POA: Insufficient documentation

## 2022-04-16 DIAGNOSIS — G35 Multiple sclerosis: Secondary | ICD-10-CM | POA: Insufficient documentation

## 2022-04-16 DIAGNOSIS — C3490 Malignant neoplasm of unspecified part of unspecified bronchus or lung: Secondary | ICD-10-CM

## 2022-04-16 DIAGNOSIS — Z833 Family history of diabetes mellitus: Secondary | ICD-10-CM | POA: Insufficient documentation

## 2022-04-16 DIAGNOSIS — J449 Chronic obstructive pulmonary disease, unspecified: Secondary | ICD-10-CM | POA: Insufficient documentation

## 2022-04-16 DIAGNOSIS — K219 Gastro-esophageal reflux disease without esophagitis: Secondary | ICD-10-CM | POA: Insufficient documentation

## 2022-04-16 DIAGNOSIS — Z803 Family history of malignant neoplasm of breast: Secondary | ICD-10-CM | POA: Insufficient documentation

## 2022-04-16 DIAGNOSIS — Z79899 Other long term (current) drug therapy: Secondary | ICD-10-CM | POA: Insufficient documentation

## 2022-04-16 DIAGNOSIS — C782 Secondary malignant neoplasm of pleura: Secondary | ICD-10-CM | POA: Insufficient documentation

## 2022-04-16 DIAGNOSIS — G893 Neoplasm related pain (acute) (chronic): Secondary | ICD-10-CM | POA: Insufficient documentation

## 2022-04-16 DIAGNOSIS — Z923 Personal history of irradiation: Secondary | ICD-10-CM | POA: Insufficient documentation

## 2022-04-16 LAB — RAD ONC ARIA SESSION SUMMARY
Course Elapsed Days: 6
Plan Fractions Treated to Date: 5
Plan Prescribed Dose Per Fraction: 3 Gy
Plan Total Fractions Prescribed: 10
Plan Total Prescribed Dose: 30 Gy
Reference Point Dosage Given to Date: 15 Gy
Reference Point Session Dosage Given: 3 Gy
Session Number: 5

## 2022-04-16 LAB — CBC
HCT: 39.2 % (ref 36.0–46.0)
Hemoglobin: 12.6 g/dL (ref 12.0–15.0)
MCH: 27.3 pg (ref 26.0–34.0)
MCHC: 32.1 g/dL (ref 30.0–36.0)
MCV: 85 fL (ref 80.0–100.0)
Platelets: 190 10*3/uL (ref 150–400)
RBC: 4.61 MIL/uL (ref 3.87–5.11)
RDW: 14.9 % (ref 11.5–15.5)
WBC: 4.8 10*3/uL (ref 4.0–10.5)
nRBC: 0 % (ref 0.0–0.2)

## 2022-04-16 MED ORDER — OXYCODONE HCL 5 MG PO TABS: 5.0000 mg | ORAL_TABLET | Freq: Four times a day (QID) | ORAL | 0 refills | Status: DC | PRN

## 2022-04-17 ENCOUNTER — Ambulatory Visit
Admission: RE | Admit: 2022-04-17 | Discharge: 2022-04-17 | Disposition: A | Discharge status: Home / Self Care | Payer: Medicaid Other | Source: Ambulatory Visit | Attending: Radiation Oncology | Admitting: Radiation Oncology

## 2022-04-17 ENCOUNTER — Other Ambulatory Visit: Payer: Self-pay | Admitting: Oncology

## 2022-04-17 ENCOUNTER — Other Ambulatory Visit: Payer: Self-pay

## 2022-04-17 ENCOUNTER — Ambulatory Visit: Payer: Medicaid Other

## 2022-04-17 DIAGNOSIS — C7951 Secondary malignant neoplasm of bone: Secondary | ICD-10-CM | POA: Diagnosis not present

## 2022-04-17 DIAGNOSIS — C3490 Malignant neoplasm of unspecified part of unspecified bronchus or lung: Secondary | ICD-10-CM

## 2022-04-17 LAB — RAD ONC ARIA SESSION SUMMARY
Course Elapsed Days: 7
Plan Fractions Treated to Date: 6
Plan Prescribed Dose Per Fraction: 3 Gy
Plan Total Fractions Prescribed: 10
Plan Total Prescribed Dose: 30 Gy
Reference Point Dosage Given to Date: 18 Gy
Reference Point Session Dosage Given: 3 Gy
Session Number: 6

## 2022-04-18 ENCOUNTER — Other Ambulatory Visit: Payer: Self-pay

## 2022-04-18 ENCOUNTER — Ambulatory Visit
Admission: RE | Admit: 2022-04-18 | Discharge: 2022-04-18 | Disposition: A | Discharge status: Home / Self Care | Payer: Medicaid Other | Source: Ambulatory Visit | Attending: Radiation Oncology | Admitting: Radiation Oncology

## 2022-04-18 ENCOUNTER — Ambulatory Visit: Payer: Medicaid Other

## 2022-04-18 DIAGNOSIS — C7951 Secondary malignant neoplasm of bone: Secondary | ICD-10-CM | POA: Diagnosis not present

## 2022-04-18 LAB — RAD ONC ARIA SESSION SUMMARY
Course Elapsed Days: 8
Plan Fractions Treated to Date: 7
Plan Prescribed Dose Per Fraction: 3 Gy
Plan Total Fractions Prescribed: 10
Plan Total Prescribed Dose: 30 Gy
Reference Point Dosage Given to Date: 21 Gy
Reference Point Session Dosage Given: 3 Gy
Session Number: 7

## 2022-04-19 ENCOUNTER — Ambulatory Visit
Admission: RE | Admit: 2022-04-19 | Discharge: 2022-04-19 | Disposition: A | Discharge status: Home / Self Care | Payer: Medicaid Other | Source: Ambulatory Visit | Attending: Radiation Oncology | Admitting: Radiation Oncology

## 2022-04-19 ENCOUNTER — Ambulatory Visit: Payer: Medicaid Other

## 2022-04-19 ENCOUNTER — Other Ambulatory Visit: Payer: Self-pay

## 2022-04-19 DIAGNOSIS — C7951 Secondary malignant neoplasm of bone: Secondary | ICD-10-CM | POA: Diagnosis not present

## 2022-04-19 LAB — RAD ONC ARIA SESSION SUMMARY
Course Elapsed Days: 9
Plan Fractions Treated to Date: 8
Plan Prescribed Dose Per Fraction: 3 Gy
Plan Total Fractions Prescribed: 10
Plan Total Prescribed Dose: 30 Gy
Reference Point Dosage Given to Date: 24 Gy
Reference Point Session Dosage Given: 3 Gy
Session Number: 8

## 2022-04-20 ENCOUNTER — Other Ambulatory Visit: Payer: Self-pay

## 2022-04-20 ENCOUNTER — Ambulatory Visit: Payer: Medicaid Other

## 2022-04-20 ENCOUNTER — Ambulatory Visit
Admission: RE | Admit: 2022-04-20 | Discharge: 2022-04-20 | Disposition: A | Discharge status: Home / Self Care | Payer: Medicaid Other | Source: Ambulatory Visit | Attending: Radiation Oncology | Admitting: Radiation Oncology

## 2022-04-20 DIAGNOSIS — C7951 Secondary malignant neoplasm of bone: Secondary | ICD-10-CM | POA: Diagnosis not present

## 2022-04-20 LAB — RAD ONC ARIA SESSION SUMMARY
Course Elapsed Days: 10
Plan Fractions Treated to Date: 9
Plan Prescribed Dose Per Fraction: 3 Gy
Plan Total Fractions Prescribed: 10
Plan Total Prescribed Dose: 30 Gy
Reference Point Dosage Given to Date: 27 Gy
Reference Point Session Dosage Given: 3 Gy
Session Number: 9

## 2022-04-23 ENCOUNTER — Inpatient Hospital Stay: Payer: Medicaid Other

## 2022-04-23 ENCOUNTER — Inpatient Hospital Stay (HOSPITAL_BASED_OUTPATIENT_CLINIC_OR_DEPARTMENT_OTHER): Payer: Medicaid Other | Admitting: Oncology

## 2022-04-23 ENCOUNTER — Ambulatory Visit: Payer: Medicaid Other

## 2022-04-23 ENCOUNTER — Encounter: Payer: Self-pay | Admitting: Oncology

## 2022-04-23 ENCOUNTER — Other Ambulatory Visit: Payer: Self-pay

## 2022-04-23 ENCOUNTER — Ambulatory Visit
Admission: RE | Admit: 2022-04-23 | Discharge: 2022-04-23 | Disposition: A | Discharge status: Home / Self Care | Payer: Medicaid Other | Source: Ambulatory Visit | Attending: Radiation Oncology | Admitting: Radiation Oncology

## 2022-04-23 VITALS — BP 111/69 | HR 98 | Temp 97.1°F | Resp 18 | Wt 156.3 lb

## 2022-04-23 DIAGNOSIS — C3490 Malignant neoplasm of unspecified part of unspecified bronchus or lung: Secondary | ICD-10-CM

## 2022-04-23 DIAGNOSIS — C7951 Secondary malignant neoplasm of bone: Secondary | ICD-10-CM | POA: Diagnosis not present

## 2022-04-23 DIAGNOSIS — M898X5 Other specified disorders of bone, thigh: Secondary | ICD-10-CM | POA: Diagnosis not present

## 2022-04-23 DIAGNOSIS — G893 Neoplasm related pain (acute) (chronic): Secondary | ICD-10-CM

## 2022-04-23 DIAGNOSIS — Z5111 Encounter for antineoplastic chemotherapy: Secondary | ICD-10-CM

## 2022-04-23 LAB — RAD ONC ARIA SESSION SUMMARY
Course Elapsed Days: 13
Plan Fractions Treated to Date: 10
Plan Prescribed Dose Per Fraction: 3 Gy
Plan Total Fractions Prescribed: 10
Plan Total Prescribed Dose: 30 Gy
Reference Point Dosage Given to Date: 30 Gy
Reference Point Session Dosage Given: 3 Gy
Session Number: 10

## 2022-04-23 LAB — CBC WITH DIFFERENTIAL/PLATELET
Abs Immature Granulocytes: 0.01 10*3/uL (ref 0.00–0.07)
Basophils Absolute: 0 10*3/uL (ref 0.0–0.1)
Basophils Relative: 0 %
Eosinophils Absolute: 0.1 10*3/uL (ref 0.0–0.5)
Eosinophils Relative: 2 %
HCT: 36.4 % (ref 36.0–46.0)
Hemoglobin: 11.5 g/dL — ABNORMAL LOW (ref 12.0–15.0)
Immature Granulocytes: 0 %
Lymphocytes Relative: 11 %
Lymphs Abs: 0.4 10*3/uL — ABNORMAL LOW (ref 0.7–4.0)
MCH: 27.2 pg (ref 26.0–34.0)
MCHC: 31.6 g/dL (ref 30.0–36.0)
MCV: 86.1 fL (ref 80.0–100.0)
Monocytes Absolute: 0.6 10*3/uL (ref 0.1–1.0)
Monocytes Relative: 18 %
Neutro Abs: 2.3 10*3/uL (ref 1.7–7.7)
Neutrophils Relative %: 69 %
Platelets: 183 10*3/uL (ref 150–400)
RBC: 4.23 MIL/uL (ref 3.87–5.11)
RDW: 15 % (ref 11.5–15.5)
WBC: 3.4 10*3/uL — ABNORMAL LOW (ref 4.0–10.5)
nRBC: 0 % (ref 0.0–0.2)

## 2022-04-23 LAB — COMPREHENSIVE METABOLIC PANEL
ALT: 12 U/L (ref 0–44)
AST: 18 U/L (ref 15–41)
Albumin: 3.1 g/dL — ABNORMAL LOW (ref 3.5–5.0)
Alkaline Phosphatase: 69 U/L (ref 38–126)
Anion gap: 5 (ref 5–15)
BUN: 13 mg/dL (ref 8–23)
CO2: 27 mmol/L (ref 22–32)
Calcium: 8.5 mg/dL — ABNORMAL LOW (ref 8.9–10.3)
Chloride: 104 mmol/L (ref 98–111)
Creatinine, Ser: 0.82 mg/dL (ref 0.44–1.00)
GFR, Estimated: >60 mL/min (ref >60)
Glucose, Bld: 87 mg/dL (ref 70–99)
Potassium: 3.6 mmol/L (ref 3.5–5.1)
Sodium: 136 mmol/L (ref 135–145)
Total Bilirubin: 0.5 mg/dL (ref 0.3–1.2)
Total Protein: 7.3 g/dL (ref 6.5–8.1)

## 2022-04-23 MED ORDER — IBUPROFEN 800 MG PO TABS: 800.0000 mg | ORAL_TABLET | Freq: Every day | ORAL | 0 refills | Status: DC

## 2022-04-23 MED ORDER — GABAPENTIN 100 MG PO CAPS: 100.0000 mg | ORAL_CAPSULE | Freq: Three times a day (TID) | ORAL | 0 refills | Status: DC

## 2022-04-23 NOTE — Progress Notes (Signed)
? ? ?Hematology/Oncology Progress note ?Telephone:(336) [phone] [phone] ?  ? ? ?Patient Care Team: ?Pcp, No as PCP - General ?Telford Nab, RN as Oncology Nurse Navigator ?[name], MD as Medical Oncologist (Oncology)  ? ?Name of the patient: Brittney Fisher  ?[id number]  ?[birth year]  ? ?REASON FOR VISIT ?follow-up for lung cancer ? ?PERTINENT ONCOLOGY HISTORY ?61 y.o. female with past medical history including GERD, COPD, current everyday smoker presents for follow-up of lung mass and pleural effusion. ?10/20/2020, patient was brought to ED via EMS due to generalized weakness, dizziness, chest pain shortness of breath.  She was recently diagnosed with COPD approximately 1 month ago by primary care provider.  Also unintentional weight loss during the last few months. ?Image work-up showed right-sided pleural effusion.  She underwent right thoracentesis.  With removal of 2.4 L of fluid. ?10/18/2020, CT chest with contrast showed central right upper lobe pulmonary bronchogenic carcinoma with direct mediastinal invasion.  Metastatic disease to low cervical/thoracic nodes, lung, right pleural space and bones.  Moderate to large right pleural effusion.  SVC narrowing. ?10/21/2020 MRI brain is negative for metastasis.  Mild chronic microvascular ischemic changes in the white matter.  Negative for acute infarct ?Regarding to the SVC narrowing, patient was seen by vascular surgeon and was recommended no intervention inpatient.  Patient to follow-up outpatient with vascular surgeon for evaluation. ?Patient also was treated for COPD exacerbation with hypoxia on exertion. ?Qualifies for home oxygen and hospitalist arrange home health and a nebulizer.  Patient was given a course of prednisone taper and empiric Levaquin. ?Patient was discharged and present today to follow-up with cytology results and further management plan ? ?10/27/2020 PET scan showed right upper lobe primary bronchogenic mass with direct invasion into  mediastinum. ?Metastatic disease to the right pleural space, lung, bone, nodes of the chest and less so lower leg/upper abdomen. ?Hypermetabolic him along the course of the right axillary vein with concurrent subtle hyper attenuation within the axillary vein and SVC.  This continues to the level of SVC narrowing.  Suspicious for SVC occlusion and developing thrombus. ?Moderate right pleural effusion and small pericardial effusion. ? 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 positive for CK7, with patchy, weak CK20.  They are  negative for TTF1, NapsinA, GATA3, CDX2, and Pax8.  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 ?She also had another repeat thoracentesis and fluid cytology showed malignancy.  ?#NGS showed AXIN 1 G4255, DIS2 M165f, KRAS G12C, PRDM1 M1 [id number], [id number] MS stable, TPS <1%  ? ?11/14/2020- 12/12/2020.  ?#Bronchial obstruction and SVC occlusion.  s/p palliative radiation ?#After 2 cycles of chemtherapy, patient had CT scan done due to shortness of breath.  01/05/2021, CT chest angiogram PE protocol showed no evidence of pulmonary embolism.  Stable disease.  Right upper lobe mass about 6.7 x 5.3 cm.  Widespread metastatic disease, lung and bone involvement. ?Interval development of right-sided hydronephrosis. ?03/06/2021 finished T2 palliative radiation ?03/22/2021, 6 cycles of carboplatin/Alimta/bevacizumab.  Infusion reaction of carboplatin during cycle 6.  Carboplatin discontinued. ?04/07/2021 CT chest abdomen pelvis-partial response ?Mild decrease in size of right lung mass, mildly decreased mediastinal and hilar adenopathy.  Extensive right-sided pleural metastasis unchanged.  Similar appearance of diffuse bilateral pulmonary nodules.  Multifocal lytic sclerotic bone metastasis.  New superior endplate deformity of T6 ?04/12/2021 -  09/08/2021, patient was continued on bevacizumab and Alimta. ? ?09/21/2021,  bone scan showed multifocal abnormal uptake including mid to distal shaft of right femur, left femoral neck and trochanter, left sacroiliac region, left ischium.  Focal activity at the proximal tibia on the left.  Multi focal heterogeneous bilateral rib activity.  Focal activity at the sternum. ?09/27/2021, CT chest abdomen pelvis with contrast showed progression of infrahilar left lower lobe with a concerning clearly progressive masslike consolidation opacity.  Mild progression of small left pleural effusion.  Similar experience of extensive irregular right-sided pleural disease.  Similar experience of bony metastasis.  ?09/29/2021 extra cycle of maintenance bevacizumab and Alimta while waiting for approval of Lumakras ? ?10/20/2021 started on Lumakras  ?01/09/2022, CT chest abdomen pelvis with contrast showed no substantial interval changes in exam.  Stable disease. ? ?INTERVAL HISTORY ?Brittney Fisher is a 61 y.o. female who has above history reviewed by me today presents for follow up visit for management of metastatic lung cancer, acute visit for swallowing pain.  ? ?Patient was accompanied by her daughter.  ?#Patient received palliative radiation to Right femur. ?Patient reports right lower back/thigh pain has not been well controlled with oxycodone 5 mg every 6 hours as needed.  Patient took Tylenol 1000 mg 3 times daily with some relief.  Patient feels the Tylenol controls her pain better than oxycodone.   ?She reports compliance with Lumakras.  No other new complaints. ?She lost 2 pounds since last visit. ? ? Review of Systems  ?Constitutional:  Negative for appetite change, chills, fatigue and fever.  ?HENT:   Negative for hearing loss and voice change.   ?Eyes:  Negative for eye problems.  ?Respiratory:  Negative for chest tightness, cough and shortness of breath.   ?Cardiovascular:  Negative for chest pain.  ?Gastrointestinal:   Negative for abdominal distention, abdominal pain and blood in stool.  ?Endocrine: Negative for hot flashes.  ?Genitourinary:  Negative for difficulty urinating and frequency.   ?Musculoskeletal:  Negative for arthralgias.  ?     Right lower extremity pain  ?Skin:  Negative for itching and rash.  ?Neurological:  Negative for extremity weakness.  ?Hematological:  Negative for adenopathy.  ?Psychiatric/Behavioral:  Negative for confusion.    ? ? ?Allergies  ?Allergen Reactions  ? Carboplatin Shortness Of Breath, Itching and Cough  ? ? ? ?Past Medical History:  ?Diagnosis Date  ? COPD (chronic obstructive pulmonary disease) ([place])   ? GERD (gastroesophageal reflux disease)   ? Metastatic lung cancer (metastasis from lung to other site) [facility]) 11/02/2020  ? ? ? ?Past Surgical History:  ?Procedure Laterality Date  ? COLONOSCOPY WITH ESOPHAGOGASTRODUODENOSCOPY (EGD)    ? COLONOSCOPY WITH PROPOFOL N/A 11/14/2015  ? Procedure: COLONOSCOPY WITH PROPOFOL;  Surgeon: Hulen Luster, MD;  Location: [facility] ENDOSCOPY;  Service: Gastroenterology;  Laterality: N/A;  ? ESOPHAGOGASTRODUODENOSCOPY (EGD) WITH PROPOFOL N/A 11/14/2015  ? Procedure: ESOPHAGOGASTRODUODENOSCOPY (EGD) WITH PROPOFOL;  Surgeon: Hulen Luster, MD;  Location: [facility] ENDOSCOPY;  Service: Gastroenterology;  Laterality: N/A;  ? ? ?Social History  ? ?Socioeconomic History  ? Marital status: Married  ?  Spouse name: Not on file  ? Number of children: Not on file  ? Years of education: Not on file  ? Highest education level: Not on file  ?Occupational History  ? Not on file  ?Tobacco Use  ? Smoking status: Former  ?  Types: Cigarettes  ?  Quit date: 09/2021  ?  Years since quitting: 0.6  ? Smokeless tobacco: Never  ?Vaping Use  ? Vaping Use: Never  used  ?Substance and Sexual Activity  ? Alcohol use: Never  ? Drug use: Never  ? Sexual activity: Not Currently  ?Other Topics Concern  ? Not on file  ?Social History Narrative  ? Not on file  ? ?Social Determinants of Health  ? ?Financial  Resource Strain: Not on file  ?Food Insecurity: Not on file  ?Transportation Needs: Not on file  ?Physical Activity: Not on file  ?Stress: Not on file  ?Social Connections: Not on file  ?[employer], broadcasting/film/video

## 2022-04-24 ENCOUNTER — Encounter: Payer: Self-pay | Admitting: Oncology

## 2022-04-24 ENCOUNTER — Other Ambulatory Visit: Payer: Self-pay | Admitting: *Deleted

## 2022-04-24 DIAGNOSIS — C3490 Malignant neoplasm of unspecified part of unspecified bronchus or lung: Secondary | ICD-10-CM

## 2022-04-24 MED ORDER — LUMAKRAS 120 MG PO TABS: 960.0000 mg | ORAL_TABLET | Freq: Every day | ORAL | 2 refills | Status: DC

## 2022-04-24 NOTE — Telephone Encounter (Signed)
CBC with Differential/Platelet ?Order: [id number] ?Status: Final result    ?Visible to patient: Yes (not seen)    ?Next appt: 05/22/2022 at 01:15 PM in Oncology ([facility])    ?Dx: Primary malignant neoplasm of lung me...    ?0 Result Notes ?          ?Component Ref Range & Units 1 d ago 8 d ago 1 mo ago 2 mo ago 3 mo ago 4 mo ago 5 mo ago  ?WBC 4.0 - 10.5 K/uL 3.4 Low   4.8  5.0  4.6  3.5 Low   4.6  4.6   ?RBC 3.87 - 5.11 MIL/uL 4.23  4.61  4.79  4.72  4.65  4.57  4.62   ?Hemoglobin 12.0 - 15.0 g/dL 11.5 Low   12.6  12.9  12.5  12.3  11.9 Low   12.3   ?HCT 36.0 - 46.0 % 36.4  39.2  40.9  39.9  39.1  39.2  39.8   ?MCV 80.0 - 100.0 fL 86.1  85.0  85.4  84.5  84.1  85.8  86.1   ?MCH 26.0 - 34.0 pg 27.2  27.3  26.9  26.5  26.5  26.0  26.6   ?MCHC 30.0 - 36.0 g/dL 31.6  32.1  31.5  31.3  31.5  30.4  30.9   ?RDW 11.5 - 15.5 % 15.0  14.9  15.1  15.9 High   16.5 High   16.8 High   17.2 High    ?Platelets 150 - 400 K/uL 183  190  185  181  146 Low   169  194   ?nRBC 0.0 - 0.2 % 0.0  0.0 CM  0.0  0.0  0.0  0.0  0.0   ?Neutrophils Relative % % 69   75  78  68  73  77   ?Neutro Abs 1.7 - 7.7 K/uL 2.3   3.7  3.6  2.4  3.4  3.5   ?Lymphocytes Relative % 11   14  13  18  11  13    ?Lymphs Abs 0.7 - 4.0 K/uL 0.4 Low    0.7  0.6 Low   0.6 Low   0.5 Low   0.6 Low    ?Monocytes Relative % 18   11  8  12  11  7    ?Monocytes Absolute 0.1 - 1.0 K/uL 0.6   0.6  0.4  0.4  0.5  0.3   ?Eosinophils Relative % 2   0  1  1  5  2    ?Eosinophils Absolute 0.0 - 0.5 K/uL 0.1   0.0  0.1  0.0  0.2  0.1   ?Basophils Relative % 0   0  0  1  0  0   ?Basophils Absolute 0.0 - 0.1 K/uL 0.0   0.0  0.0  0.0  0.0  0.0   ?Immature Granulocytes % 0   0  0  0  0  1   ?Abs Immature Granulocytes 0.00 - 0.07 K/uL 0.01   0.02 CM  0.01 CM  0.01 CM  0.01 CM  0.03 CM   ?Comment: Performed at [facility], [address] [place], [place] [address]  ?Resulting Agency  [facility]  ?  ? ?  ?   ?Specimen Collected: 04/23/22 13:49 Last Resulted: 04/23/22 14:04  ?  ?  Lab Flowsheet   ? Order Details   ? View Encounter   ? Lab and Collection Details   ? Routing   ? Result History    ?View Encounter Conversation    ?  ?CM=Additional comments    ?  ?Result Care Coordination ? ? ?Patient Communication ? ? Add Comments   Add Notifications  Back to Top  ?  ?  ? ?Other Results from 04/23/2022 ? ? Contains abnormal data Comprehensive metabolic panel ?Order: [id number] ?Status: Final result    ?Visible to patient: Yes (not seen)    ?Next appt: 05/22/2022 at 01:15 PM in Oncology ([facility])    ?Dx: Primary malignant neoplasm of lung me...    ?0 Result Notes ?          ?Component Ref Range & Units 1 d ago 1 mo ago 2 mo ago 3 mo ago 4 mo ago 5 mo ago 6 mo ago  ?Sodium 135 - 145 mmol/L 136  135  136  138  136  137  138   ?Potassium 3.5 - 5.1 mmol/L 3.6  3.6  4.1  4.0  3.9  3.8  4.2   ?Chloride 98 - 111 mmol/L 104  104  102  101  99  99  101   ?CO2 22 - 32 mmol/L 27  26  28  28  28  27  29    ?Glucose, Bld 70 - 99 mg/dL 87  77 CM  100 High  CM  100 High  CM  89 CM  106 High  CM  130 High  CM   ?Comment: Glucose reference range applies only to samples taken after fasting for at least 8 hours.  ?BUN 8 - 23 mg/dL 13  28 High   15  13  16  12  11    ?Creatinine, Ser 0.44 - 1.00 mg/dL 0.82  0.89  0.86  0.80  0.64  0.61  0.71   ?Calcium 8.9 - 10.3 mg/dL 8.5 Low   8.6 Low   9.1  9.1  8.6 Low   9.0  9.0   ?Total Protein 6.5 - 8.1 g/dL 7.3  7.6  7.5  7.5  7.3  7.7  7.4   ?Albumin 3.5 - 5.0 g/dL 3.1 Low   3.3 Low   3.2 Low   3.2 Low   3.1 Low   3.1 Low   3.1 Low    ?AST 15 - 41 U/L 18  19  20  17  20  31  26    ?ALT 0 - 44 U/L 12  15  18  14  19  31  19    ?Alkaline Phosphatase 38 - 126 U/L 69  70  70  70  87  90  82   ?Total Bilirubin 0.3 - 1.2 mg/dL 0.5  0.3  0.1 Low   0.6  0.1 Low   0.5  0.2 Low    ?GFR, Estimated >60 mL/min >60  >60 CM  >60 CM  >60 CM  >60 CM  >60 CM  >60 CM   ?Comment: (NOTE)  ?Calculated using the CKD-EPI  Creatinine Equation (2021)   ?Anion gap 5 - 15 5  5  CM  6 CM  9 CM  9 CM  11 CM  8 CM   ?Comment: Performed at [facility], [address], [place], [place] [address]  ?Resulting Agency  [facility]  [facility]  ?  ? ?  ?  ?Specimen Collected: 04/23/22 13:49 Last Resulted: 04/23/22 14:18  ?  ?  ? ?

## 2022-04-25 ENCOUNTER — Encounter: Payer: Self-pay | Admitting: Oncology

## 2022-05-01 ENCOUNTER — Inpatient Hospital Stay (HOSPITAL_BASED_OUTPATIENT_CLINIC_OR_DEPARTMENT_OTHER): Payer: Medicaid Other | Admitting: Oncology

## 2022-05-01 ENCOUNTER — Encounter: Payer: Self-pay | Admitting: Oncology

## 2022-05-01 VITALS — BP 109/69 | HR 116 | Temp 98.7°F | Resp 20 | Wt 154.4 lb

## 2022-05-01 DIAGNOSIS — C3490 Malignant neoplasm of unspecified part of unspecified bronchus or lung: Secondary | ICD-10-CM | POA: Diagnosis not present

## 2022-05-01 DIAGNOSIS — G893 Neoplasm related pain (acute) (chronic): Secondary | ICD-10-CM | POA: Diagnosis not present

## 2022-05-01 DIAGNOSIS — M898X5 Other specified disorders of bone, thigh: Secondary | ICD-10-CM | POA: Diagnosis not present

## 2022-05-01 DIAGNOSIS — C7951 Secondary malignant neoplasm of bone: Secondary | ICD-10-CM | POA: Diagnosis not present

## 2022-05-01 MED ORDER — ALPRAZOLAM 0.5 MG PO TABS: 0.5000 mg | ORAL_TABLET | ORAL | 0 refills | Status: DC

## 2022-05-01 MED ORDER — OXYCODONE HCL ER 15 MG PO T12A: 15.0000 mg | EXTENDED_RELEASE_TABLET | Freq: Two times a day (BID) | ORAL | 0 refills | Status: DC

## 2022-05-01 MED ORDER — DEXAMETHASONE 4 MG PO TABS: 4.0000 mg | ORAL_TABLET | Freq: Every day | ORAL | 0 refills | Status: DC | PRN

## 2022-05-01 NOTE — Progress Notes (Signed)
? ? ?Hematology/Oncology Progress note ?[phone] [phone] ?  ? ? ?Patient Care Team: ?Pcp, No as PCP - General ?Telford Nab, RN as Oncology Nurse Navigator ?[name], MD as Medical Oncologist (Oncology)  ? ?Name of the patient: Brittney Fisher  ?[id number]  ?[birth year]  ? ?REASON FOR VISIT ?follow-up for lung cancer ? ?PERTINENT ONCOLOGY HISTORY ?61 y.o. female with past medical history including GERD, COPD, current everyday smoker presents for follow-up of lung mass and pleural effusion. ?10/20/2020, patient was brought to ED via EMS due to generalized weakness, dizziness, chest pain shortness of breath.  She was recently diagnosed with COPD approximately 1 month ago by primary care provider.  Also unintentional weight loss during the last few months. ?Image work-up showed right-sided pleural effusion.  She underwent right thoracentesis.  With removal of 2.4 L of fluid. ?10/18/2020, CT chest with contrast showed central right upper lobe pulmonary bronchogenic carcinoma with direct mediastinal invasion.  Metastatic disease to low cervical/thoracic nodes, lung, right pleural space and bones.  Moderate to large right pleural effusion.  SVC narrowing. ?10/21/2020 MRI brain is negative for metastasis.  Mild chronic microvascular ischemic changes in the white matter.  Negative for acute infarct ?Regarding to the SVC narrowing, patient was seen by vascular surgeon and was recommended no intervention inpatient.  Patient to follow-up outpatient with vascular surgeon for evaluation. ?Patient also was treated for COPD exacerbation with hypoxia on exertion. ?Qualifies for home oxygen and hospitalist arrange home health and a nebulizer.  Patient was given a course of prednisone taper and empiric Levaquin. ?Patient was discharged and present today to follow-up with cytology results and further management plan ? ?10/27/2020 PET scan showed right upper lobe primary bronchogenic mass with direct invasion into  mediastinum. ?Metastatic disease to the right pleural space, lung, bone, nodes of the chest and less so lower leg/upper abdomen. ?Hypermetabolic him along the course of the right axillary vein with concurrent subtle hyper attenuation within the axillary vein and SVC.  This continues to the level of SVC narrowing.  Suspicious for SVC occlusion and developing thrombus. ?Moderate right pleural effusion and small pericardial effusion. ? 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 positive for CK7, with patchy, weak CK20.  They are  negative for TTF1, NapsinA, GATA3, CDX2, and Pax8.  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 ?She also had another repeat thoracentesis and fluid cytology showed malignancy.  ?#NGS showed AXIN 1 G4255, DIS2 M165f, KRAS G12C, PRDM1 M1 [id number], [id number] MS stable, TPS <1%  ? ?11/14/2020- 12/12/2020.  ?#Bronchial obstruction and SVC occlusion.  s/p palliative radiation ?#After 2 cycles of chemtherapy, patient had CT scan done due to shortness of breath.  01/05/2021, CT chest angiogram PE protocol showed no evidence of pulmonary embolism.  Stable disease.  Right upper lobe mass about 6.7 x 5.3 cm.  Widespread metastatic disease, lung and bone involvement. ?Interval development of right-sided hydronephrosis. ?03/06/2021 finished T2 palliative radiation ?03/22/2021, 6 cycles of carboplatin/Alimta/bevacizumab.  Infusion reaction of carboplatin during cycle 6.  Carboplatin discontinued. ?04/07/2021 CT chest abdomen pelvis-partial response ?Mild decrease in size of right lung mass, mildly decreased mediastinal and hilar adenopathy.  Extensive right-sided pleural metastasis unchanged.  Similar appearance of diffuse bilateral pulmonary nodules.  Multifocal lytic sclerotic bone metastasis.  New superior endplate deformity of T6 ?04/12/2021 -  09/08/2021, patient was continued on bevacizumab and Alimta. ? ?09/21/2021,  bone scan showed multifocal abnormal uptake including mid to distal shaft of right femur, left femoral neck and trochanter, left sacroiliac region, left ischium.  Focal activity at the proximal tibia on the left.  Multi focal heterogeneous bilateral rib activity.  Focal activity at the sternum. ?09/27/2021, CT chest abdomen pelvis with contrast showed progression of infrahilar left lower lobe with a concerning clearly progressive masslike consolidation opacity.  Mild progression of small left pleural effusion.  Similar experience of extensive irregular right-sided pleural disease.  Similar experience of bony metastasis.  ?09/29/2021 extra cycle of maintenance bevacizumab and Alimta while waiting for approval of Lumakras ? ?10/20/2021 started on Lumakras  ?01/09/2022, CT chest abdomen pelvis with contrast showed no substantial interval changes in exam.  Stable disease. ?03/20/2022, CT chest abdomen pelvis with contrast showed unchanged circumferential pleural thickening/pleural nodularity of the right hemithorax, left infrahilar mass and bilateral pulmonary nodules.  Stable right perihilar consolidation likely postradiation changes.  Unchanged numerous sclerotic osseous lesions.  Aortic atherosclerosis. ? ?INTERVAL HISTORY ?Brittney Fisher is a 61 y.o. female who has above history reviewed by me today presents for follow up visit for management of metastatic lung cancer, acute visit for pain.  ?During last visit, ibuprofen was added 800 mg once daily.  In addition to Tylenol.  Patient also takes oxycodone 5 mg as needed.  Patient reports that sometimes she takes 10 mg without significant improvement of her pain.  Pain is located in the right lower extremity as well as right side of the lower back. ? ? Review of Systems  ?Constitutional:  Negative for appetite change, chills, fatigue and fever.  ?HENT:   Negative for hearing loss and voice change.    ?Eyes:  Negative for eye problems.  ?Respiratory:  Negative for chest tightness, cough and shortness of breath.   ?Cardiovascular:  Negative for chest pain.  ?Gastrointestinal:  Negative for abdominal distention, abdominal pain and blood in stool.  ?Endocrine: Negative for hot flashes.  ?Genitourinary:  Negative for difficulty urinating and frequency.   ?Musculoskeletal:  Negative for arthralgias.  ?     Right lower extremity pain  ?Skin:  Negative for itching and rash.  ?Neurological:  Negative for extremity weakness.  ?Hematological:  Negative for adenopathy.  ?Psychiatric/Behavioral:  Negative for confusion.    ? ? ?Allergies  ?Allergen Reactions  ? Carboplatin Shortness Of Breath, Itching and Cough  ? ? ? ?Past Medical History:  ?Diagnosis Date  ? COPD (chronic obstructive pulmonary disease) ([place])   ? GERD (gastroesophageal reflux disease)   ? Metastatic lung cancer (metastasis from lung to other site) [facility]) 11/02/2020  ? ? ? ?Past Surgical History:  ?Procedure Laterality Date  ? COLONOSCOPY WITH ESOPHAGOGASTRODUODENOSCOPY (EGD)    ? COLONOSCOPY WITH PROPOFOL N/A 11/14/2015  ? Procedure: COLONOSCOPY WITH PROPOFOL;  Surgeon: Hulen Luster, MD;  Location: [facility] ENDOSCOPY;  Service: Gastroenterology;  Laterality: N/A;  ? ESOPHAGOGASTRODUODENOSCOPY (EGD) WITH PROPOFOL N/A 11/14/2015  ? Procedure: ESOPHAGOGASTRODUODENOSCOPY (EGD) WITH PROPOFOL;  Surgeon: Hulen Luster, MD;  Location: [facility] ENDOSCOPY;  Service: Gastroenterology;  Laterality: N/A;  ? ? ?Social History  ? ?Socioeconomic History  ? Marital status: Married  ?  Spouse name: Not on file  ? Number of children: Not on file  ? Years of education: Not on file  ? Highest education level: Not on file  ?Occupational History  ? Not on file  ?Tobacco Use  ? Smoking status: Former  ?  Types: Cigarettes  ?  Quit date: 09/2021  ?  Years since quitting: 0.6  ? Smokeless tobacco: Never  ?Vaping Use  ? Vaping Use: Never used  ?Substance and Sexual Activity  ? Alcohol use: Never  ?  Drug use: Never  ? Sexual activity: Not Currently  ?Other Topics Concern  ? Not on file  ?Social History Narrative  ? Not on file  ? ?Social Determinants of Health  ? ?Financial Resource Strain: Not on fil

## 2022-05-02 ENCOUNTER — Telehealth: Payer: Self-pay

## 2022-05-02 NOTE — Telephone Encounter (Signed)
-----   Message from [facility] sent at 05/02/2022 11:58 AM EDT ----- ?Regarding: MRI ?Just spoke to patient and her insurance has termed. She is trying to figure it out and wants to hold off on the MRI requested at [place] appointment. She will call to get it scheduled when her insurance is corrected.  ? ?MRI lumbar and sacrum w wo contrast  ? ?

## 2022-05-08 ENCOUNTER — Inpatient Hospital Stay: Payer: Medicaid Other | Admitting: Hospice and Palliative Medicine

## 2022-05-18 ENCOUNTER — Encounter: Payer: Self-pay | Admitting: Oncology

## 2022-05-22 ENCOUNTER — Encounter: Payer: Self-pay | Admitting: Oncology

## 2022-05-22 ENCOUNTER — Inpatient Hospital Stay: Payer: Medicaid Other | Attending: Radiation Oncology | Admitting: Oncology

## 2022-05-22 ENCOUNTER — Inpatient Hospital Stay: Payer: Medicaid Other

## 2022-05-22 VITALS — BP 112/63 | HR 110 | Temp 98.4°F | Resp 18 | Wt 154.2 lb

## 2022-05-22 DIAGNOSIS — Z800 Family history of malignant neoplasm of digestive organs: Secondary | ICD-10-CM | POA: Insufficient documentation

## 2022-05-22 DIAGNOSIS — J449 Chronic obstructive pulmonary disease, unspecified: Secondary | ICD-10-CM | POA: Insufficient documentation

## 2022-05-22 DIAGNOSIS — K219 Gastro-esophageal reflux disease without esophagitis: Secondary | ICD-10-CM | POA: Insufficient documentation

## 2022-05-22 DIAGNOSIS — Z8249 Family history of ischemic heart disease and other diseases of the circulatory system: Secondary | ICD-10-CM | POA: Diagnosis not present

## 2022-05-22 DIAGNOSIS — I3139 Other pericardial effusion (noninflammatory): Secondary | ICD-10-CM | POA: Diagnosis not present

## 2022-05-22 DIAGNOSIS — C3490 Malignant neoplasm of unspecified part of unspecified bronchus or lung: Secondary | ICD-10-CM

## 2022-05-22 DIAGNOSIS — C3411 Malignant neoplasm of upper lobe, right bronchus or lung: Secondary | ICD-10-CM | POA: Diagnosis present

## 2022-05-22 DIAGNOSIS — M79604 Pain in right leg: Secondary | ICD-10-CM | POA: Diagnosis not present

## 2022-05-22 DIAGNOSIS — Z87891 Personal history of nicotine dependence: Secondary | ICD-10-CM | POA: Insufficient documentation

## 2022-05-22 DIAGNOSIS — G893 Neoplasm related pain (acute) (chronic): Secondary | ICD-10-CM | POA: Diagnosis not present

## 2022-05-22 DIAGNOSIS — J910 Malignant pleural effusion: Secondary | ICD-10-CM | POA: Insufficient documentation

## 2022-05-22 DIAGNOSIS — C782 Secondary malignant neoplasm of pleura: Secondary | ICD-10-CM | POA: Insufficient documentation

## 2022-05-22 DIAGNOSIS — Z833 Family history of diabetes mellitus: Secondary | ICD-10-CM | POA: Diagnosis not present

## 2022-05-22 DIAGNOSIS — Z79899 Other long term (current) drug therapy: Secondary | ICD-10-CM | POA: Diagnosis not present

## 2022-05-22 DIAGNOSIS — C7951 Secondary malignant neoplasm of bone: Secondary | ICD-10-CM | POA: Insufficient documentation

## 2022-05-22 DIAGNOSIS — Z803 Family history of malignant neoplasm of breast: Secondary | ICD-10-CM | POA: Diagnosis not present

## 2022-05-22 DIAGNOSIS — G35 Multiple sclerosis: Secondary | ICD-10-CM | POA: Insufficient documentation

## 2022-05-22 DIAGNOSIS — Z5111 Encounter for antineoplastic chemotherapy: Secondary | ICD-10-CM

## 2022-05-22 LAB — CBC WITH DIFFERENTIAL/PLATELET
Abs Immature Granulocytes: 0.01 10*3/uL (ref 0.00–0.07)
Basophils Absolute: 0 10*3/uL (ref 0.0–0.1)
Basophils Relative: 0 %
Eosinophils Absolute: 0.1 10*3/uL (ref 0.0–0.5)
Eosinophils Relative: 2 %
HCT: 37.7 % (ref 36.0–46.0)
Hemoglobin: 12.1 g/dL (ref 12.0–15.0)
Immature Granulocytes: 0 %
Lymphocytes Relative: 10 %
Lymphs Abs: 0.4 10*3/uL — ABNORMAL LOW (ref 0.7–4.0)
MCH: 28 pg (ref 26.0–34.0)
MCHC: 32.1 g/dL (ref 30.0–36.0)
MCV: 87.3 fL (ref 80.0–100.0)
Monocytes Absolute: 0.4 10*3/uL (ref 0.1–1.0)
Monocytes Relative: 12 %
Neutro Abs: 2.9 10*3/uL (ref 1.7–7.7)
Neutrophils Relative %: 76 %
Platelets: 200 10*3/uL (ref 150–400)
RBC: 4.32 MIL/uL (ref 3.87–5.11)
RDW: 15 % (ref 11.5–15.5)
WBC: 3.7 10*3/uL — ABNORMAL LOW (ref 4.0–10.5)
nRBC: 0 % (ref 0.0–0.2)

## 2022-05-22 LAB — COMPREHENSIVE METABOLIC PANEL
ALT: 12 U/L (ref 0–44)
AST: 16 U/L (ref 15–41)
Albumin: 3.1 g/dL — ABNORMAL LOW (ref 3.5–5.0)
Alkaline Phosphatase: 71 U/L (ref 38–126)
Anion gap: 6 (ref 5–15)
BUN: 16 mg/dL (ref 8–23)
CO2: 28 mmol/L (ref 22–32)
Calcium: 8.6 mg/dL — ABNORMAL LOW (ref 8.9–10.3)
Chloride: 103 mmol/L (ref 98–111)
Creatinine, Ser: 0.92 mg/dL (ref 0.44–1.00)
GFR, Estimated: >60 mL/min (ref >60)
Glucose, Bld: 84 mg/dL (ref 70–99)
Potassium: 4 mmol/L (ref 3.5–5.1)
Sodium: 137 mmol/L (ref 135–145)
Total Bilirubin: 0.2 mg/dL — ABNORMAL LOW (ref 0.3–1.2)
Total Protein: 7.3 g/dL (ref 6.5–8.1)

## 2022-05-22 MED ORDER — GABAPENTIN 100 MG PO CAPS: 100.0000 mg | ORAL_CAPSULE | Freq: Three times a day (TID) | ORAL | 0 refills | Status: DC

## 2022-05-22 MED ORDER — OXYCODONE HCL ER 15 MG PO T12A
15.0000 mg | EXTENDED_RELEASE_TABLET | Freq: Two times a day (BID) | ORAL | 0 refills | Status: DC
Start: 2022-05-22 — End: 2022-06-14

## 2022-05-22 NOTE — Progress Notes (Signed)
Hematology/Oncology Progress note [phone] [phone]     Patient Care Team: Pcp, No as PCP - General Telford Nab, RN as Oncology Nurse Navigator [name], MD as Medical Oncologist (Oncology)   Name of the patient: Eriana Suliman  [id number]  18-Aug-1961   REASON FOR VISIT follow-up for lung cancer  PERTINENT ONCOLOGY HISTORY 61 y.o. female with past medical history including GERD, COPD, current everyday smoker presents for follow-up of lung mass and pleural effusion. 10/20/2020, patient was brought to ED via EMS due to generalized weakness, dizziness, chest pain shortness of breath.  She was recently diagnosed with COPD approximately 1 month ago by primary care provider.  Also unintentional weight loss during the last few months. Image work-up showed right-sided pleural effusion.  She underwent right thoracentesis.  With removal of 2.4 L of fluid. 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  Metastatic disease to low cervical/thoracic nodes, lung, right pleural space and bones.  Moderate to large right pleural effusion.  SVC narrowing. 10/21/2020 MRI brain is negative for metastasis.  Mild chronic microvascular ischemic changes in the white matter.  Negative for acute infarct Regarding to the SVC narrowing, patient was seen by vascular surgeon and was recommended no intervention inpatient.  Patient to follow-up outpatient with vascular surgeon for evaluation. Patient also was treated for COPD exacerbation with hypoxia on exertion. Qualifies for home oxygen and hospitalist arrange home health and a nebulizer.  Patient was given a course of prednisone taper and empiric Levaquin. Patient was discharged and present today to follow-up with cytology results and further management plan  10/27/2020 PET scan showed right upper lobe primary bronchogenic mass with direct invasion into  mediastinum. Metastatic disease to the right pleural space, lung, bone, nodes of the chest and less so lower leg/upper abdomen. Hypermetabolic him along the course of the right axillary vein with concurrent subtle hyper attenuation within the axillary vein and SVC.  This continues to the level of SVC narrowing.  Suspicious for SVC occlusion and developing thrombus. Moderate right pleural effusion and small pericardial effusion.  # # left supraclavicular mass biopsy- pathology is positive for adenocarcinoma. positive for CK7, with patchy, weak CK20.  They are  negative for TTF1, NapsinA, GATA3, CDX2, and Pax8.  The findings are  nonspecific; but may be compatible with a poorly differentiated  adenocarcinoma of lung origin, especially given the imaging findings  She also had another repeat thoracentesis and fluid cytology showed malignancy.  #NGS showed AXIN 1 [id number], DIS2 M176f, KRAS G12C, PRDM1 M1 [id number], [id number] MS stable, TPS <1%   11/14/2020- 12/12/2020.  #Bronchial obstruction and SVC occlusion.  s/p palliative radiation #After 2 cycles of chemtherapy, patient had CT scan done due to shortness of breath.  01/05/2021, CT chest angiogram PE protocol showed no evidence of pulmonary embolism.  Stable disease.  Right upper lobe mass about 6.7 x 5.3 cm.  Widespread metastatic disease, lung and bone involvement. Interval development of right-sided hydronephrosis. 03/06/2021 finished T2 palliative radiation 03/22/2021, 6 cycles of carboplatin/Alimta/bevacizumab.  Infusion reaction of carboplatin during cycle 6.  Carboplatin discontinued. 04/07/2021 CT chest abdomen pelvis-partial response Mild decrease in size of right lung mass, mildly decreased mediastinal and hilar adenopathy.  Extensive right-sided pleural metastasis unchanged.  Similar appearance of diffuse bilateral pulmonary nodules.  Multifocal lytic sclerotic bone metastasis.  New superior endplate deformity of T6 04/12/2021 -  09/08/2021, patient was continued on bevacizumab and Alimta.  09/21/2021,  bone scan showed multifocal abnormal uptake including mid to distal shaft of right femur, left femoral neck and trochanter, left sacroiliac region, left ischium.  Focal activity at the proximal tibia on the left.  Multi focal heterogeneous bilateral rib activity.  Focal activity at the sternum. 09/27/2021, CT chest abdomen pelvis with contrast showed progression of infrahilar left lower lobe with a concerning clearly progressive masslike consolidation opacity.  Mild progression of small left pleural effusion.  Similar experience of extensive irregular right-sided pleural disease.  Similar experience of bony metastasis.  09/29/2021 extra cycle of maintenance bevacizumab and Alimta while waiting for approval of Lumakras  10/20/2021 started on Lumakras  01/09/2022, CT chest abdomen pelvis with contrast showed no substantial interval changes in exam.  Stable disease. 03/20/2022, CT chest abdomen pelvis with contrast showed unchanged circumferential pleural thickening/pleural nodularity of the right hemithorax, left infrahilar mass and bilateral pulmonary nodules.  Stable right perihilar consolidation likely postradiation changes.  Unchanged numerous sclerotic osseous lesions.  Aortic atherosclerosis.  INTERVAL HISTORY ASLEY BASKERVILLE is a 61 y.o. female who has above history reviewed by me today presents for follow up visit for management of metastatic lung cancer, acute visit for pain.  Patient currently takes OxyContin every 12 hours.  She feels the pain is partially controlled.  Today she rated her pain at 6 out of 10  MRI lumbar and sacrum brain will not be able to be scheduled due to lack of insurance coverage. Weight is relatively stable.  Appetite is fair.     Review of Systems  Constitutional:  Negative for appetite change, chills, fatigue and fever.  HENT:   Negative for hearing loss and voice change.   Eyes:  Negative for  eye problems.  Respiratory:  Negative for chest tightness, cough and shortness of breath.   Cardiovascular:  Negative for chest pain.  Gastrointestinal:  Negative for abdominal distention, abdominal pain and blood in stool.  Endocrine: Negative for hot flashes.  Genitourinary:  Negative for difficulty urinating and frequency.   Musculoskeletal:  Negative for arthralgias.       Right lower extremity pain  Skin:  Negative for itching and rash.  Neurological:  Negative for extremity weakness.  Hematological:  Negative for adenopathy.  Psychiatric/Behavioral:  Negative for confusion.      Allergies  Allergen Reactions   Carboplatin Shortness Of Breath, Itching and Cough     Past Medical History:  Diagnosis Date   COPD (chronic obstructive pulmonary disease) (HCC)    GERD (gastroesophageal reflux disease)    Metastatic lung cancer (metastasis from lung to other site) ([place]) 11/02/2020     Past Surgical History:  Procedure Laterality Date   COLONOSCOPY WITH ESOPHAGOGASTRODUODENOSCOPY (EGD)     COLONOSCOPY WITH PROPOFOL N/A 11/14/2015   Procedure: COLONOSCOPY WITH PROPOFOL;  Surgeon: Hulen Luster, MD;  Location: [facility] ENDOSCOPY;  Service: Gastroenterology;  Laterality: N/A;   ESOPHAGOGASTRODUODENOSCOPY (EGD) WITH PROPOFOL N/A 11/14/2015   Procedure: ESOPHAGOGASTRODUODENOSCOPY (EGD) WITH PROPOFOL;  Surgeon: Hulen Luster, MD;  Location: [facility] ENDOSCOPY;  Service: Gastroenterology;  Laterality: N/A;    Social History   Socioeconomic History   Marital status: Married    Spouse name: Not on file   Number of children: Not on file   Years of education: Not on file   Highest education level: Not on file  Occupational History   Not on file  Tobacco Use   Smoking status: Former    Types: Cigarettes    Quit date: 09/2021  Years since quitting: 0.6   Smokeless tobacco: Never  Vaping Use   Vaping Use: Never used  Substance and Sexual Activity   Alcohol use: Never   Drug use: Never    Sexual activity: Not Currently  Other Topics Concern   Not on file  Social History Narrative   Not on file   Social Determinants of Health   Financial Resource Strain: Not on file  Food Insecurity: Not on file  Transportation Needs: Not on file  Physical Activity: Not on file  Stress: Not on file  Social Connections: Not on file  Intimate Partner Violence: Not on file    Family History  Problem Relation Age of Onset   Breast cancer Cousin 43   Diabetes type II Mother    Hypertension Mother    Colon cancer Father    Hypertension Father      Current Outpatient Medications:    acetaminophen (TYLENOL) 500 MG tablet, Take 1,000 mg by mouth every 8 (eight) hours as needed for moderate pain. , Disp: , Rfl:    benzonatate (TESSALON) 100 MG capsule, Take 1 capsule by mouth three times daily as needed for cough, Disp: 60 capsule, Rfl: 2   calcium carbonate (TUMS - DOSED IN MG ELEMENTAL CALCIUM) 500 MG chewable tablet, Chew 1 tablet by mouth as needed., Disp: , Rfl:    dexamethasone (DECADRON) 4 MG tablet, Take 1 tablet (4 mg total) by mouth daily as needed., Disp: 10 tablet, Rfl: 0   folic acid (FOLVITE) 1 MG tablet, Take 1 tablet (1 mg total) by mouth daily. Start 5-7 days before Alimta chemotherapy. Continue until 21 days after Alimta completed., Disp: 100 tablet, Rfl: 3   guaiFENesin (MUCINEX) 600 MG 12 hr tablet, Take 1 tablet (600 mg total) by mouth 2 (two) times daily., Disp: 14 tablet, Rfl: 0   ibuprofen (ADVIL) 800 MG tablet, Take 1 tablet (800 mg total) by mouth daily., Disp: 30 tablet, Rfl: 0   ipratropium-albuterol (DUONEB) 0.5-2.5 (3) MG/3ML SOLN, Take 3 mLs by nebulization 3 (three) times daily., Disp: 360 mL, Rfl: 1   magic mouthwash w/lidocaine SOLN, Take 5 mLs by mouth 4 (four) times daily as needed for mouth pain. Sig: Swish/Swallow 5-10 ml four times a day as needed. Dispense 480 ml. 1RF, Disp: 480 mL, Rfl: 1   ondansetron (ZOFRAN) 8 MG tablet, Take 1 tablet (8 mg total)  by mouth 2 (two) times daily as needed for refractory nausea / vomiting. Start on day 3 after chemo., Disp: 30 tablet, Rfl: 1   oxyCODONE (OXY IR/ROXICODONE) 5 MG immediate release tablet, Take 1 tablet (5 mg total) by mouth every 6 (six) hours as needed for moderate pain or severe pain., Disp: 120 tablet, Rfl: 0   Polyethylene Glycol 3350 (MIRALAX PO), Take by mouth daily as needed., Disp: , Rfl:    prochlorperazine (COMPAZINE) 10 MG tablet, Take 1 tablet (10 mg total) by mouth every 6 (six) hours as needed (Nausea or vomiting)., Disp: 30 tablet, Rfl: 1   protein supplement shake (PREMIER PROTEIN) LIQD, Take 2 oz by mouth 4 (four) times daily., Disp: , Rfl:    senna-docusate (SENOKOT-S) 8.6-50 MG tablet, Take 2 tablets by mouth daily., Disp: 60 tablet, Rfl: 3   sotorasib (LUMAKRAS) 120 MG tablet, Take 960 mg (8 tablets) by mouth daily., Disp: 240 tablet, Rfl: 2   sucralfate (CARAFATE) 1 GM/10ML suspension, Take 10 mLs (1 g total) by mouth in the morning, at noon, and at bedtime., Disp:  420 mL, Rfl: 0   ALPRAZolam (XANAX) 0.5 MG tablet, Take 1 tablet (0.5 mg total) by mouth See admin instructions. Take 1 tablet 60 minutes before procedure; if needed due to incomplete response and/or duration of procedure, may repeat another 1 tablet after 30 minutes. (Patient not taking: Reported on 05/22/2022), Disp: 2 tablet, Rfl: 0   gabapentin (NEURONTIN) 100 MG capsule, Take 1 capsule (100 mg total) by mouth 3 (three) times daily., Disp: 90 capsule, Rfl: 0   oxyCODONE (OXYCONTIN) 15 mg 12 hr tablet, Take 1 tablet (15 mg total) by mouth every 12 (twelve) hours., Disp: 20 tablet, Rfl: 0  Physical exam: ECOG 1 Vitals:   05/22/22 1340  BP: 112/63  Pulse: (!) 110  Resp: 18  Temp: 98.4 F (36.9 C)  SpO2: 100%  Weight: 154 lb 3.2 oz (69.9 kg)   Physical Exam Constitutional:      General: She is not in acute distress.    Comments: Thin built female walks independently  HENT:     Head: Normocephalic and  atraumatic.  Eyes:     General: No scleral icterus. Cardiovascular:     Rate and Rhythm: Normal rate and regular rhythm.     Heart sounds: Normal heart sounds.  Pulmonary:     Effort: Pulmonary effort is normal. No respiratory distress.     Breath sounds: No wheezing.     Comments: Slightly decreased breath sound bilaterally.  Abdominal:     General: Bowel sounds are normal. There is no distension.     Palpations: Abdomen is soft.  Musculoskeletal:        General: No deformity. Normal range of motion.     Cervical back: Normal range of motion and neck supple.  Skin:    General: Skin is warm and dry.     Findings: No erythema or rash.  Neurological:     Mental Status: She is alert and oriented to person, place, and time. Mental status is at baseline.     Cranial Nerves: No cranial nerve deficit.     Coordination: Coordination normal.  Psychiatric:        Mood and Affect: Mood normal.       Latest Ref Rng & Units 05/22/2022    1:25 PM  CMP  Glucose 70 - 99 mg/dL 84    BUN 8 - 23 mg/dL 16    Creatinine 0.44 - 1.00 mg/dL 0.92    Sodium 135 - 145 mmol/L 137    Potassium 3.5 - 5.1 mmol/L 4.0    Chloride 98 - 111 mmol/L 103    CO2 22 - 32 mmol/L 28    Calcium 8.9 - 10.3 mg/dL 8.6    Total Protein 6.5 - 8.1 g/dL 7.3    Total Bilirubin 0.3 - 1.2 mg/dL 0.2    Alkaline Phos 38 - 126 U/L 71    AST 15 - 41 U/L 16    ALT 0 - 44 U/L 12        Latest Ref Rng & Units 05/22/2022    1:25 PM  CBC  WBC 4.0 - 10.5 K/uL 3.7    Hemoglobin 12.0 - 15.0 g/dL 12.1    Hematocrit 36.0 - 46.0 % 37.7    Platelets 150 - 400 K/uL 200      RADIOGRAPHIC STUDIES: I have personally reviewed the radiological images as listed and agreed with the findings in the report. CT CHEST ABDOMEN PELVIS W CONTRAST  Result Date: 03/20/2022 CLINICAL DATA:  Follow-up lung cancer EXAM: CT CHEST, ABDOMEN, AND PELVIS WITH CONTRAST TECHNIQUE: Multidetector CT imaging of the chest, abdomen and pelvis was performed  following the standard protocol during bolus administration of intravenous contrast. RADIATION DOSE REDUCTION: This exam was performed according to the departmental dose-optimization program which includes automated exposure control, adjustment of the mA and/or kV according to patient size and/or use of iterative reconstruction technique. CONTRAST:  110m OMNIPAQUE IOHEXOL 300 MG/ML  SOLN COMPARISON:  CT chest, abdomen and pelvis dated January 09, 2022 FINDINGS: CT CHEST FINDINGS Cardiovascular: Normal heart size. No pericardial effusion. Atherosclerotic disease of the thoracic aorta. Mediastinum/Nodes: Unchanged confluency soft tissue in the right hilum and mediastinum. Stable size precarinal lymph node measuring 1.5 cm in short axis. Esophagus and thyroid are unremarkable. Lungs/Pleura: Circumferential right pleural thickening and nodularity, unchanged when compared with prior exam. Reference right upper lobe juxtapleural nodule measures 1.2 x 0.9 cm, unchanged in size when compared with prior exam and remeasured in similar plane. Right paramediastinal linear opacities with associated traction bronchiectasis, likely post radiation change, unchanged when compared with prior exam. Unchanged size of left infrahilar lower mass, difficult to accurately measure due to adjacent atelectatic lung additional bilateral solid pulmonary nodules are unchanged in size when compared with prior exam. Reference right lower lobe pulmonary nodule measuring 9 mm on series 3, image 105. Stable small bilateral pleural effusions. Musculoskeletal: Unchanged numerous sclerotic osseous lesions. CT ABDOMEN PELVIS FINDINGS Hepatobiliary: No focal liver abnormality is seen. No gallstones, gallbladder wall thickening, or biliary dilatation. Pancreas: Unremarkable. No pancreatic ductal dilatation or surrounding inflammatory changes. Spleen: Normal in size without focal abnormality. Adrenals/Urinary Tract: Bilateral adrenal glands are  unremarkable. No hydronephrosis. Bilateral low-attenuation renal lesions which are compatible with simple cysts, no follow-up imaging is recommended for this finding. Bladder is unremarkable. Stomach/Bowel: Stomach is within normal limits. Appendix is not visualized. No evidence of bowel wall thickening, distention, or inflammatory changes. Vascular/Lymphatic: Aortic atherosclerosis. No enlarged abdominal or pelvic lymph nodes. Reproductive: Uterus and bilateral adnexa are unremarkable. Other: No abdominal wall hernia or abnormality. No abdominopelvic ascites. Musculoskeletal: Unchanged numerous sclerotic osseous lesions. IMPRESSION: 1. Unchanged circumferential pleural thickening and pleural nodularity of the right hemithorax, left infrahilar mass, and bilateral pulmonary nodules. 2. Stable right perihilar consolidation, likely post radiation changes. 3. Unchanged numerous sclerotic osseous lesions. 4.  Aortic Atherosclerosis (ICD10-I70.0). Electronically Signed   By: LYetta GlassmanM.D.   On: 03/20/2022 17:36   DG FEMUR, MIN 2 VIEWS RIGHT  Result Date: 03/12/2022 CLINICAL DATA:  Right leg pain for 3 weeks. EXAM: RIGHT FEMUR 2 VIEWS COMPARISON:  January 09, 2022. FINDINGS: No fracture or dislocation is noted. No significant degenerative changes seen involving the right hip. Sclerotic densities are noted in the proximal and distal right femur consistent with metastatic disease as noted on prior exam. Rounded lucency with sclerotic margins is noted in the right femoral neck which also may represent metastatic disease. IMPRESSION: Sclerotic and lucent lesions are noted in the femur concerning for metastatic disease. No acute fracture or dislocation is noted. Electronically Signed   By: JMarijo ConceptionM.D.   On: 03/12/2022 12:47       Assessment and plan  1. Primary malignant neoplasm of lung metastatic to other site, unspecified laterality ([facility] [place]   2. Encounter for antineoplastic chemotherapy   3.  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  cT4 N3 M1-  KRASG12C mutation, currently on second line treatment with Lumakras [Patient has high TMB, TPS less than 1%.  Immunotherapy in combination with chemotherapy will be other options in the future if lumakras is not effective] Labs reviewed and discussed with patient. Continue Lumakras.    Right thigh pain, lower back pain, status post palliative radiation.  Pain has not significantly improved Continue OxyContin every 12 hours.  She is not taking short acting oxycodone. Continue Tylenol and ibuprofen as needed.  Gabapentin 100 mg 3 times daily.  Pending Medicaid approval for MRI.  Bone metastasis, patient declines bisphosphonate.  Follow-up in 4 weeks.  [name], MD, PhD [facility] Hematology Oncology 05/22/2022

## 2022-05-28 ENCOUNTER — Ambulatory Visit: Payer: BC Managed Care – PPO | Admitting: Radiation Oncology

## 2022-05-29 ENCOUNTER — Encounter: Payer: Self-pay | Admitting: Oncology

## 2022-05-30 ENCOUNTER — Telehealth: Payer: Self-pay

## 2022-05-30 NOTE — Telephone Encounter (Signed)
Patient sent Mychart message stating that Medicaid has been approved from 05/16/22 through october 10/16/22. Pt is wanting for MRI sacrum and lumbar to be scheduled. Please schedule and inform pt of appt.

## 2022-05-31 ENCOUNTER — Telehealth: Payer: Self-pay

## 2022-05-31 NOTE — Telephone Encounter (Signed)
Attempt to request for PA on Oxycontin 15 mg ER on Cover My Meds but got the message:: To initiate an authorization request for this medication, please contact [employer] at [phone].  PA submitted on provider portal of [employer] website.  The PA was filled out online (paper copy sent to be scanned in chart).  Fax submitted Successfully to [phone] thru website

## 2022-06-03 ENCOUNTER — Encounter: Payer: Self-pay | Admitting: Oncology

## 2022-06-05 ENCOUNTER — Encounter: Payer: Self-pay | Admitting: Oncology

## 2022-06-08 ENCOUNTER — Ambulatory Visit
Admission: RE | Admit: 2022-06-08 | Discharge: 2022-06-08 | Disposition: A | Discharge status: Home / Self Care | Payer: Medicaid Other | Source: Ambulatory Visit | Attending: Oncology | Admitting: Oncology

## 2022-06-08 DIAGNOSIS — G893 Neoplasm related pain (acute) (chronic): Secondary | ICD-10-CM

## 2022-06-08 DIAGNOSIS — C3490 Malignant neoplasm of unspecified part of unspecified bronchus or lung: Secondary | ICD-10-CM

## 2022-06-08 DIAGNOSIS — M898X5 Other specified disorders of bone, thigh: Secondary | ICD-10-CM | POA: Insufficient documentation

## 2022-06-08 MED ORDER — GADOBUTROL 1 MMOL/ML IV SOLN
7.0000 mL | Freq: Once | INTRAVENOUS | Status: AC | PRN
Start: 2022-06-08 — End: 2022-06-08
  Administered 2022-06-08: 7.5 mL via INTRAVENOUS

## 2022-06-14 ENCOUNTER — Other Ambulatory Visit: Payer: Self-pay | Admitting: Oncology

## 2022-06-16 ENCOUNTER — Encounter: Payer: Self-pay | Admitting: Oncology

## 2022-06-16 MED ORDER — OXYCODONE HCL ER 15 MG PO T12A
15.0000 mg | EXTENDED_RELEASE_TABLET | Freq: Two times a day (BID) | ORAL | 0 refills | Status: DC
Start: 2022-06-16 — End: 2022-08-14

## 2022-06-21 ENCOUNTER — Inpatient Hospital Stay: Payer: Medicaid Other | Attending: Radiation Oncology

## 2022-06-21 ENCOUNTER — Encounter: Payer: Self-pay | Admitting: Oncology

## 2022-06-21 ENCOUNTER — Inpatient Hospital Stay (HOSPITAL_BASED_OUTPATIENT_CLINIC_OR_DEPARTMENT_OTHER): Payer: Medicaid Other | Admitting: Oncology

## 2022-06-21 VITALS — BP 101/80 | HR 107 | Temp 99.2°F | Resp 18 | Wt 154.8 lb

## 2022-06-21 DIAGNOSIS — Z79899 Other long term (current) drug therapy: Secondary | ICD-10-CM | POA: Insufficient documentation

## 2022-06-21 DIAGNOSIS — M79651 Pain in right thigh: Secondary | ICD-10-CM | POA: Insufficient documentation

## 2022-06-21 DIAGNOSIS — Z8249 Family history of ischemic heart disease and other diseases of the circulatory system: Secondary | ICD-10-CM | POA: Diagnosis not present

## 2022-06-21 DIAGNOSIS — M545 Low back pain, unspecified: Secondary | ICD-10-CM | POA: Insufficient documentation

## 2022-06-21 DIAGNOSIS — C3411 Malignant neoplasm of upper lobe, right bronchus or lung: Secondary | ICD-10-CM | POA: Insufficient documentation

## 2022-06-21 DIAGNOSIS — C3490 Malignant neoplasm of unspecified part of unspecified bronchus or lung: Secondary | ICD-10-CM

## 2022-06-21 DIAGNOSIS — Z833 Family history of diabetes mellitus: Secondary | ICD-10-CM | POA: Diagnosis not present

## 2022-06-21 DIAGNOSIS — R350 Frequency of micturition: Secondary | ICD-10-CM | POA: Diagnosis not present

## 2022-06-21 DIAGNOSIS — Z5111 Encounter for antineoplastic chemotherapy: Secondary | ICD-10-CM | POA: Diagnosis not present

## 2022-06-21 DIAGNOSIS — K219 Gastro-esophageal reflux disease without esophagitis: Secondary | ICD-10-CM | POA: Insufficient documentation

## 2022-06-21 DIAGNOSIS — C7951 Secondary malignant neoplasm of bone: Secondary | ICD-10-CM | POA: Insufficient documentation

## 2022-06-21 DIAGNOSIS — R300 Dysuria: Secondary | ICD-10-CM | POA: Diagnosis not present

## 2022-06-21 DIAGNOSIS — Z800 Family history of malignant neoplasm of digestive organs: Secondary | ICD-10-CM | POA: Diagnosis not present

## 2022-06-21 DIAGNOSIS — J449 Chronic obstructive pulmonary disease, unspecified: Secondary | ICD-10-CM | POA: Diagnosis not present

## 2022-06-21 DIAGNOSIS — G893 Neoplasm related pain (acute) (chronic): Secondary | ICD-10-CM

## 2022-06-21 DIAGNOSIS — Z803 Family history of malignant neoplasm of breast: Secondary | ICD-10-CM | POA: Insufficient documentation

## 2022-06-21 DIAGNOSIS — J910 Malignant pleural effusion: Secondary | ICD-10-CM | POA: Insufficient documentation

## 2022-06-21 DIAGNOSIS — F17210 Nicotine dependence, cigarettes, uncomplicated: Secondary | ICD-10-CM | POA: Insufficient documentation

## 2022-06-21 LAB — CBC WITH DIFFERENTIAL/PLATELET
Abs Immature Granulocytes: 0.01 10*3/uL (ref 0.00–0.07)
Basophils Absolute: 0 10*3/uL (ref 0.0–0.1)
Basophils Relative: 0 %
Eosinophils Absolute: 0 10*3/uL (ref 0.0–0.5)
Eosinophils Relative: 1 %
HCT: 37.7 % (ref 36.0–46.0)
Hemoglobin: 12.2 g/dL (ref 12.0–15.0)
Immature Granulocytes: 0 %
Lymphocytes Relative: 12 %
Lymphs Abs: 0.5 10*3/uL — ABNORMAL LOW (ref 0.7–4.0)
MCH: 28.1 pg (ref 26.0–34.0)
MCHC: 32.4 g/dL (ref 30.0–36.0)
MCV: 86.9 fL (ref 80.0–100.0)
Monocytes Absolute: 0.4 10*3/uL (ref 0.1–1.0)
Monocytes Relative: 9 %
Neutro Abs: 3.3 10*3/uL (ref 1.7–7.7)
Neutrophils Relative %: 78 %
Platelets: 194 10*3/uL (ref 150–400)
RBC: 4.34 MIL/uL (ref 3.87–5.11)
RDW: 14.6 % (ref 11.5–15.5)
WBC: 4.2 10*3/uL (ref 4.0–10.5)
nRBC: 0 % (ref 0.0–0.2)

## 2022-06-21 LAB — URINALYSIS, COMPLETE (UACMP) WITH MICROSCOPIC
Bacteria, UA: NONE SEEN
Bilirubin Urine: NEGATIVE
Glucose, UA: NEGATIVE mg/dL
Hgb urine dipstick: NEGATIVE
Ketones, ur: NEGATIVE mg/dL
Nitrite: NEGATIVE
Protein, ur: NEGATIVE mg/dL
Specific Gravity, Urine: 1.012 (ref 1.005–1.030)
pH: 5 (ref 5.0–8.0)

## 2022-06-21 LAB — COMPREHENSIVE METABOLIC PANEL
ALT: 12 U/L (ref 0–44)
AST: 17 U/L (ref 15–41)
Albumin: 3.3 g/dL — ABNORMAL LOW (ref 3.5–5.0)
Alkaline Phosphatase: 62 U/L (ref 38–126)
Anion gap: 7 (ref 5–15)
BUN: 15 mg/dL (ref 8–23)
CO2: 26 mmol/L (ref 22–32)
Calcium: 8.9 mg/dL (ref 8.9–10.3)
Chloride: 100 mmol/L (ref 98–111)
Creatinine, Ser: 0.76 mg/dL (ref 0.44–1.00)
GFR, Estimated: >60 mL/min (ref >60)
Glucose, Bld: 80 mg/dL (ref 70–99)
Potassium: 4.2 mmol/L (ref 3.5–5.1)
Sodium: 133 mmol/L — ABNORMAL LOW (ref 135–145)
Total Bilirubin: 0.4 mg/dL (ref 0.3–1.2)
Total Protein: 7.6 g/dL (ref 6.5–8.1)

## 2022-06-21 NOTE — Progress Notes (Signed)
Pt here for follow up. Pt reports she has urinary frequency and a "funny" feeling after voiding. She would like a UA collected today.

## 2022-06-22 ENCOUNTER — Encounter: Payer: Self-pay | Admitting: Oncology

## 2022-06-22 DIAGNOSIS — R300 Dysuria: Secondary | ICD-10-CM | POA: Insufficient documentation

## 2022-06-22 LAB — URINE CULTURE: Culture: <10000 — AB

## 2022-06-22 MED ORDER — PHENAZOPYRIDINE HCL 95 MG PO TABS: 190.0000 mg | ORAL_TABLET | Freq: Three times a day (TID) | ORAL | 0 refills | Status: DC | PRN

## 2022-06-22 NOTE — Assessment & Plan Note (Signed)
UA and culture were obtained.  Non significant growth.  Hold off antibiotics.  Recommend Azo.

## 2022-06-22 NOTE — Assessment & Plan Note (Signed)
Treatment is listed above.

## 2022-06-22 NOTE — Assessment & Plan Note (Signed)
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  cT4 N3 M1-  KRASG12C mutation, currently on second line treatment with Lumakras [Patient has high TMB, TPS less than 1%.  Immunotherapy in combination with chemotherapy will be other options in the future if lumakras is not effective] Labs reviewed and discussed with patient. Continue Lumakras.  Obtain CT chest abdomen pelvis

## 2022-06-22 NOTE — Assessment & Plan Note (Signed)
patient declines bisphosphonate.

## 2022-06-22 NOTE — Assessment & Plan Note (Signed)
Right thigh pain, lower back pain, status post palliative radiation.  Pain has not significantly improved Continue OxyContin every 12 hours.  She is not taking short acting oxycodone. Continue Tylenol and ibuprofen as needed.   Continue Gabapentin 100 mg 3 times daily.   MRI showed bone metastasis as expected.

## 2022-06-22 NOTE — Progress Notes (Signed)
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  cT4 N3 M1-  KRASG12C mutation, currently on second line treatment with Lumakras [Patient has high TMB, TPS less than 1%.  Immunotherapy in combination with chemotherapy will be other options in the future if lumakras is not effective] Labs reviewed and discussed with patient. Continue Lumakras.  Obtain CT chest abdomen pelvis  Encounter for antineoplastic chemotherapy Treatment is listed above.  Bone metastasis patient declines bisphosphonate.  Neoplasm related pain Right thigh pain, lower back pain, status post palliative radiation.  Pain has not significantly improved Continue OxyContin every 12 hours.  She is not taking short acting oxycodone. Continue Tylenol and ibuprofen as needed.   Continue Gabapentin 100 mg 3 times daily.   MRI showed bone metastasis as expected.  Dysuria UA and culture were obtained.  Non significant growth.  Hold off antibiotics.  Recommend Azo.  Orders Placed This Encounter  Procedures   Urine Culture    Standing Status:   Future    Number of Occurrences:   1    Standing Expiration Date:   06/22/2023   CT CHEST ABDOMEN PELVIS W CONTRAST    Standing Status:   Future    Standing Expiration Date:   06/22/2023    Order Specific Question:   Preferred imaging location?    Answer:   [facility]    Order Specific Question:   Is Oral Contrast requested for this exam?    Answer:   Yes, Per Radiology protocol   Urinalysis, Complete w Microscopic    Standing Status:   Future    Number of Occurrences:   1     Standing Expiration Date:   06/22/2023   CBC with Differential/Platelet    Standing Status:   Future    Standing Expiration Date:   06/22/2023   Comprehensive metabolic panel    Standing Status:   Future    Standing Expiration Date:   06/21/2023   Follow up in 4 weeks.  All questions were answered. The patient knows to call the clinic with any problems, questions or concerns.  [name], MD, PhD [facility] Hematology Oncology 06/21/2022    PERTINENT ONCOLOGY HISTORY 61 y.o. female with past medical history including GERD, COPD, current everyday smoker presents for follow-up of lung mass and pleural effusion. 10/20/2020, patient was brought to ED via EMS due to generalized weakness, dizziness, chest pain shortness of breath.  She was recently diagnosed with COPD approximately 1 month ago by primary care provider.  Also unintentional weight loss during the last few months. Image work-up showed right-sided pleural effusion.  She underwent right thoracentesis.  With removal of 2.4 L of fluid. 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  Metastatic disease to low cervical/thoracic nodes, lung, right pleural space and bones.  Moderate to large right pleural effusion.  SVC narrowing. 10/21/2020 MRI brain is negative for metastasis.  Mild chronic microvascular ischemic changes in the white matter.  Negative for acute infarct Regarding to the SVC narrowing, patient was seen by vascular surgeon and was recommended no  intervention inpatient.  Patient to follow-up outpatient with vascular surgeon for evaluation. Patient also was treated for COPD exacerbation with hypoxia on exertion. Qualifies for home oxygen and hospitalist arrange home health and a nebulizer.  Patient was given a course of prednisone taper and empiric Levaquin. Patient was discharged and present today to follow-up with cytology results and further  management plan  10/27/2020 PET scan showed right upper lobe primary bronchogenic mass with direct invasion into mediastinum. Metastatic disease to the right pleural space, lung, bone, nodes of the chest and less so lower leg/upper abdomen. Hypermetabolic him along the course of the right axillary vein with concurrent subtle hyper attenuation within the axillary vein and SVC.  This continues to the level of SVC narrowing.  Suspicious for SVC occlusion and developing thrombus. Moderate right pleural effusion and small pericardial effusion.  # # left supraclavicular mass biopsy- pathology is positive for adenocarcinoma. positive for CK7, with patchy, weak CK20.  They are  negative for TTF1, NapsinA, GATA3, CDX2, and Pax8.  The findings are  nonspecific; but may be compatible with a poorly differentiated  adenocarcinoma of lung origin, especially given the imaging findings  She also had another repeat thoracentesis and fluid cytology showed malignancy.  #NGS showed AXIN 1 [id number], DIS2 M155f, KRAS G12C, PRDM1 M1 [id number], [id number] MS stable, TPS <1%   11/14/2020- 12/12/2020.  #Bronchial obstruction and SVC occlusion.  s/p palliative radiation #After 2 cycles of chemtherapy, patient had CT scan done due to shortness of breath.  01/05/2021, CT chest angiogram PE protocol showed no evidence of pulmonary embolism.  Stable disease.  Right upper lobe mass about 6.7 x 5.3 cm.  Widespread metastatic disease, lung and bone involvement. Interval development of right-sided hydronephrosis. 03/06/2021 finished T2 palliative radiation 03/22/2021, 6 cycles of carboplatin/Alimta/bevacizumab.  Infusion reaction of carboplatin during cycle 6.  Carboplatin discontinued. 04/07/2021 CT chest abdomen pelvis-partial response Mild decrease in size of right lung mass, mildly decreased mediastinal and hilar adenopathy.  Extensive right-sided pleural metastasis unchanged.  Similar appearance of diffuse bilateral pulmonary  nodules.  Multifocal lytic sclerotic bone metastasis.  New superior endplate deformity of T6 04/12/2021 - 09/08/2021, patient was continued on bevacizumab and Alimta.  09/21/2021, bone scan showed multifocal abnormal uptake including mid to distal shaft of right femur, left femoral neck and trochanter, left sacroiliac region, left ischium.  Focal activity at the proximal tibia on the left.  Multi focal heterogeneous bilateral rib activity.  Focal activity at the sternum. 09/27/2021, CT chest abdomen pelvis with contrast showed progression of infrahilar left lower lobe with a concerning clearly progressive masslike consolidation opacity.  Mild progression of small left pleural effusion.  Similar experience of extensive irregular right-sided pleural disease.  Similar experience of bony metastasis.  09/29/2021 extra cycle of maintenance bevacizumab and Alimta while waiting for approval of Lumakras  10/20/2021 started on Lumakras  01/09/2022, CT chest abdomen pelvis with contrast showed no substantial interval changes in exam.  Stable disease. 03/20/2022, CT chest abdomen pelvis with contrast showed unchanged circumferential pleural thickening/pleural nodularity of the right hemithorax, left infrahilar mass and bilateral pulmonary nodules.  Stable right perihilar consolidation likely postradiation changes.  Unchanged numerous sclerotic osseous lesions.  Aortic atherosclerosis.  INTERVAL HISTORY VBRENLYN BESHARAis a 61y.o. female who has above history reviewed by me today presents for follow up visit for management of metastatic lung cancer, acute visit for pain.  Right thigh pain at 6 out of 10, she has not taken her oxycodone, she takes  Tylenol.  Weight is relatively stable.  Appetite is fair.   Pt reports she has urinary frequency and a "funny" feeling after voiding. No fever chills, flank pain   Review of Systems  Constitutional:  Negative for appetite change, chills, fatigue and fever.  HENT:    Negative for hearing loss and voice change.   Eyes:  Negative for eye problems.  Respiratory:  Negative for chest tightness, cough and shortness of breath.   Cardiovascular:  Negative for chest pain.  Gastrointestinal:  Negative for abdominal distention, abdominal pain and blood in stool.  Endocrine: Negative for hot flashes.  Genitourinary:  Negative for difficulty urinating and frequency.   Musculoskeletal:  Negative for arthralgias.       Right lower extremity pain  Skin:  Negative for itching and rash.  Neurological:  Negative for extremity weakness.  Hematological:  Negative for adenopathy.  Psychiatric/Behavioral:  Negative for confusion.       Allergies  Allergen Reactions   Carboplatin Shortness Of Breath, Itching and Cough     Past Medical History:  Diagnosis Date   COPD (chronic obstructive pulmonary disease) (HCC)    GERD (gastroesophageal reflux disease)    Metastatic lung cancer (metastasis from lung to other site) ([place]) 11/02/2020     Past Surgical History:  Procedure Laterality Date   COLONOSCOPY WITH ESOPHAGOGASTRODUODENOSCOPY (EGD)     COLONOSCOPY WITH PROPOFOL N/A 11/14/2015   Procedure: COLONOSCOPY WITH PROPOFOL;  Surgeon: Hulen Luster, MD;  Location: [facility] ENDOSCOPY;  Service: Gastroenterology;  Laterality: N/A;   ESOPHAGOGASTRODUODENOSCOPY (EGD) WITH PROPOFOL N/A 11/14/2015   Procedure: ESOPHAGOGASTRODUODENOSCOPY (EGD) WITH PROPOFOL;  Surgeon: Hulen Luster, MD;  Location: [facility] ENDOSCOPY;  Service: Gastroenterology;  Laterality: N/A;    Social History   Socioeconomic History   Marital status: Married    Spouse name: Not on file   Number of children: Not on file   Years of education: Not on file   Highest education level: Not on file  Occupational History   Not on file  Tobacco Use   Smoking status: Former    Types: Cigarettes    Quit date: 09/2021    Years since quitting: 0.7   Smokeless tobacco: Never  Vaping Use   Vaping Use: Never used   Substance and Sexual Activity   Alcohol use: Never   Drug use: Never   Sexual activity: Not Currently  Other Topics Concern   Not on file  Social History Narrative   Not on file   Social Determinants of Health   Financial Resource Strain: Not on file  Food Insecurity: Not on file  Transportation Needs: Not on file  Physical Activity: Not on file  Stress: Not on file  Social Connections: Not on file  Intimate Partner Violence: Not on file    Family History  Problem Relation Age of Onset   Breast cancer Cousin 77   Diabetes type II Mother    Hypertension Mother    Colon cancer Father    Hypertension Father      Current Outpatient Medications:    acetaminophen (TYLENOL) 500 MG tablet, Take 1,000 mg by mouth every 8 (eight) hours as needed for moderate pain. , Disp: , Rfl:    ALPRAZolam (XANAX) 0.5 MG tablet, Take 1 tablet (0.5 mg total) by mouth See admin instructions. Take 1 tablet 60 minutes before procedure; if needed due to incomplete response and/or duration of procedure, may repeat another 1 tablet after 30 minutes., Disp: 2 tablet, Rfl: 0  benzonatate (TESSALON) 100 MG capsule, Take 1 capsule by mouth three times daily as needed for cough, Disp: 60 capsule, Rfl: 2   calcium carbonate (TUMS - DOSED IN MG ELEMENTAL CALCIUM) 500 MG chewable tablet, Chew 1 tablet by mouth as needed., Disp: , Rfl:    dexamethasone (DECADRON) 4 MG tablet, Take 1 tablet (4 mg total) by mouth daily as needed., Disp: 10 tablet, Rfl: 0   folic acid (FOLVITE) 1 MG tablet, Take 1 tablet (1 mg total) by mouth daily. Start 5-7 days before Alimta chemotherapy. Continue until 21 days after Alimta completed., Disp: 100 tablet, Rfl: 3   guaiFENesin (MUCINEX) 600 MG 12 hr tablet, Take 1 tablet (600 mg total) by mouth 2 (two) times daily., Disp: 14 tablet, Rfl: 0   ibuprofen (ADVIL) 800 MG tablet, Take 1 tablet (800 mg total) by mouth daily., Disp: 30 tablet, Rfl: 0   oxyCODONE (OXYCONTIN) 15 mg 12 hr  tablet, Take 1 tablet (15 mg total) by mouth every 12 (twelve) hours., Disp: 60 tablet, Rfl: 0   Polyethylene Glycol 3350 (MIRALAX PO), Take by mouth daily as needed., Disp: , Rfl:    protein supplement shake (PREMIER PROTEIN) LIQD, Take 2 oz by mouth 4 (four) times daily., Disp: , Rfl:    sotorasib (LUMAKRAS) 120 MG tablet, Take 960 mg (8 tablets) by mouth daily., Disp: 240 tablet, Rfl: 2   gabapentin (NEURONTIN) 100 MG capsule, Take 1 capsule (100 mg total) by mouth 3 (three) times daily. (Patient not taking: Reported on 06/21/2022), Disp: 90 capsule, Rfl: 0   ipratropium-albuterol (DUONEB) 0.5-2.5 (3) MG/3ML SOLN, Take 3 mLs by nebulization 3 (three) times daily. (Patient not taking: Reported on 06/21/2022), Disp: 360 mL, Rfl: 1   magic mouthwash w/lidocaine SOLN, Take 5 mLs by mouth 4 (four) times daily as needed for mouth pain. Sig: Swish/Swallow 5-10 ml four times a day as needed. Dispense 480 ml. 1RF (Patient not taking: Reported on 06/21/2022), Disp: 480 mL, Rfl: 1   ondansetron (ZOFRAN) 8 MG tablet, Take 1 tablet (8 mg total) by mouth 2 (two) times daily as needed for refractory nausea / vomiting. Start on day 3 after chemo. (Patient not taking: Reported on 06/21/2022), Disp: 30 tablet, Rfl: 1   oxyCODONE (OXY IR/ROXICODONE) 5 MG immediate release tablet, Take 1 tablet (5 mg total) by mouth every 6 (six) hours as needed for moderate pain or severe pain. (Patient not taking: Reported on 06/21/2022), Disp: 120 tablet, Rfl: 0   prochlorperazine (COMPAZINE) 10 MG tablet, Take 1 tablet (10 mg total) by mouth every 6 (six) hours as needed (Nausea or vomiting). (Patient not taking: Reported on 06/21/2022), Disp: 30 tablet, Rfl: 1   senna-docusate (SENOKOT-S) 8.6-50 MG tablet, Take 2 tablets by mouth daily. (Patient not taking: Reported on 06/21/2022), Disp: 60 tablet, Rfl: 3   sucralfate (CARAFATE) 1 GM/10ML suspension, Take 10 mLs (1 g total) by mouth in the morning, at noon, and at bedtime. (Patient not taking:  Reported on 06/21/2022), Disp: 420 mL, Rfl: 0  Physical exam: ECOG 1 Vitals:   06/21/22 1345  BP: 101/80  Pulse: (!) 107  Resp: 18  Temp: 99.2 F (37.3 C)  SpO2: 100%  Weight: 154 lb 12.8 oz (70.2 kg)   Physical Exam Constitutional:      General: She is not in acute distress.    Comments: Thin built female walks independently  HENT:     Head: Normocephalic and atraumatic.  Eyes:     General: No  scleral icterus. Cardiovascular:     Rate and Rhythm: Normal rate and regular rhythm.     Heart sounds: Normal heart sounds.  Pulmonary:     Effort: Pulmonary effort is normal. No respiratory distress.     Breath sounds: No wheezing.     Comments: Slightly decreased breath sound bilaterally.  Abdominal:     General: Bowel sounds are normal. There is no distension.     Palpations: Abdomen is soft.  Musculoskeletal:        General: No deformity. Normal range of motion.     Cervical back: Normal range of motion and neck supple.  Skin:    General: Skin is warm and dry.     Findings: No erythema or rash.  Neurological:     Mental Status: She is alert and oriented to person, place, and time. Mental status is at baseline.     Cranial Nerves: No cranial nerve deficit.     Coordination: Coordination normal.  Psychiatric:        Mood and Affect: Mood normal.        Latest Ref Rng & Units 06/21/2022    1:31 PM  CMP  Glucose 70 - 99 mg/dL 80   BUN 8 - 23 mg/dL 15   Creatinine 0.44 - 1.00 mg/dL 0.76   Sodium 135 - 145 mmol/L 133   Potassium 3.5 - 5.1 mmol/L 4.2   Chloride 98 - 111 mmol/L 100   CO2 22 - 32 mmol/L 26   Calcium 8.9 - 10.3 mg/dL 8.9   Total Protein 6.5 - 8.1 g/dL 7.6   Total Bilirubin 0.3 - 1.2 mg/dL 0.4   Alkaline Phos 38 - 126 U/L 62   AST 15 - 41 U/L 17   ALT 0 - 44 U/L 12       Latest Ref Rng & Units 06/21/2022    1:31 PM  CBC  WBC 4.0 - 10.5 K/uL 4.2   Hemoglobin 12.0 - 15.0 g/dL 12.2   Hematocrit 36.0 - 46.0 % 37.7   Platelets 150 - 400 K/uL 194      RADIOGRAPHIC STUDIES: I have personally reviewed the radiological images as listed and agreed with the findings in the report. MR SACRUM SI JOINTS W WO CONTRAST  Result Date: 06/10/2022 CLINICAL DATA:  Low back pain, right leg pain. EXAM: MRI SACRUM WITHOUT AND WITH CONTRAST TECHNIQUE: Multiplanar multi-sequence MR imaging of the sacrum was performed. Without and with intravenous contrast. 7.5 mL of Gadavist COMPARISON:  None Available. FINDINGS: Bones/Joint/Cartilage There are multiple sclerotic lesions throughout the sacrum and bilateral ilia with mild enhancement on post-contrast sequences consistent for metastatic disease. No SI joint widening or erosive changes. No subchondral reactive marrow changes. No SI joint effusion. Ligaments, Muscles and Tendons Muscles are normal. No muscle atrophy. No muscle edema. Piriformis muscles are normal bilaterally without signal abnormality. Soft tissue No fluid collection or hematoma. No soft tissue mass. Normal neurovascular bundles. IMPRESSION: 1. Multiple sclerotic lesions of sacrum and bilateral ilia with enhancement on post contrast sequences consistent with metastatic disease. 2.  No evidence of sacroiliitis or joint effusion. 3.  No significant finding of the neurovascular bundle bilaterally. Electronically Signed   By: Keane Police D.O.   On: 06/10/2022 22:50   MR Lumbar Spine W Wo Contrast  Result Date: 06/10/2022 CLINICAL DATA:  Back pain and right leg pain EXAM: MRI LUMBAR SPINE WITHOUT AND WITH CONTRAST TECHNIQUE: Multiplanar and multiecho pulse sequences of the lumbar spine were obtained  without and with intravenous contrast. CONTRAST:  7.52m GADAVIST GADOBUTROL 1 MMOL/ML IV SOLN COMPARISON:  CT chest abdomen pelvis 03/20/2022 FINDINGS: Segmentation:  Standard. Alignment:  Grade 1 anterolisthesis at L5-S1 Vertebrae: Multiple sclerotic lesions are again seen, involving all lumbar levels. Conus medullaris and cauda equina: Conus extends to the L1  level. Conus and cauda equina appear normal. Paraspinal and other soft tissues: Negative Disc levels: L1-L2: Normal disc space and facet joints. No spinal canal stenosis. No neural foraminal stenosis. L2-L3: Normal disc space and facet joints. No spinal canal stenosis. No neural foraminal stenosis. L3-L4: Small right asymmetric disc bulge. Right spinal canal stenosis. No neural foraminal stenosis. L4-L5: Small disc bulge and mild facet hypertrophy. No spinal canal stenosis. Mild right neural foraminal stenosis. L5-S1: Moderate facet hypertrophy. Grade 1 anterolisthesis with disc uncovering. No spinal canal stenosis. Severe right and mild left neural foraminal stenosis. Visualized sacrum: Normal. IMPRESSION: 1. Unchanged appearance of multiple sclerotic lesions involving all lumbar levels, consistent with osseous metastatic disease. 2. Severe right L5-S1 neural foraminal stenosis. 3. Mild right L4-5 neural foraminal stenosis. Electronically Signed   By: KUlyses JarredM.D.   On: 06/10/2022 02:33

## 2022-06-25 ENCOUNTER — Telehealth: Payer: Self-pay

## 2022-06-25 NOTE — Telephone Encounter (Signed)
-----   Message from [name], MD sent at 06/22/2022 11:46 PM EDT ----- Urine culture grow insignificant growth. No need for antibiotics.  I recommend her to take Azo PRN for 2 days. Rx sent.

## 2022-06-25 NOTE — Telephone Encounter (Signed)
Called and informed patient. Patient verbalized understanding.

## 2022-07-19 ENCOUNTER — Encounter: Payer: Self-pay | Admitting: Oncology

## 2022-07-19 ENCOUNTER — Inpatient Hospital Stay: Payer: Medicaid Other | Attending: Radiation Oncology

## 2022-07-19 ENCOUNTER — Inpatient Hospital Stay (HOSPITAL_BASED_OUTPATIENT_CLINIC_OR_DEPARTMENT_OTHER): Payer: Medicaid Other | Admitting: Oncology

## 2022-07-19 DIAGNOSIS — C3411 Malignant neoplasm of upper lobe, right bronchus or lung: Secondary | ICD-10-CM | POA: Diagnosis not present

## 2022-07-19 DIAGNOSIS — Z87891 Personal history of nicotine dependence: Secondary | ICD-10-CM | POA: Diagnosis not present

## 2022-07-19 DIAGNOSIS — Z5111 Encounter for antineoplastic chemotherapy: Secondary | ICD-10-CM | POA: Diagnosis not present

## 2022-07-19 DIAGNOSIS — G4709 Other insomnia: Secondary | ICD-10-CM | POA: Diagnosis not present

## 2022-07-19 DIAGNOSIS — M79651 Pain in right thigh: Secondary | ICD-10-CM | POA: Insufficient documentation

## 2022-07-19 DIAGNOSIS — M545 Low back pain, unspecified: Secondary | ICD-10-CM | POA: Insufficient documentation

## 2022-07-19 DIAGNOSIS — G4700 Insomnia, unspecified: Secondary | ICD-10-CM | POA: Insufficient documentation

## 2022-07-19 DIAGNOSIS — G893 Neoplasm related pain (acute) (chronic): Secondary | ICD-10-CM | POA: Diagnosis not present

## 2022-07-19 DIAGNOSIS — C782 Secondary malignant neoplasm of pleura: Secondary | ICD-10-CM | POA: Diagnosis not present

## 2022-07-19 DIAGNOSIS — C3490 Malignant neoplasm of unspecified part of unspecified bronchus or lung: Secondary | ICD-10-CM

## 2022-07-19 DIAGNOSIS — C7951 Secondary malignant neoplasm of bone: Secondary | ICD-10-CM | POA: Insufficient documentation

## 2022-07-19 DIAGNOSIS — Z79899 Other long term (current) drug therapy: Secondary | ICD-10-CM | POA: Insufficient documentation

## 2022-07-19 LAB — COMPREHENSIVE METABOLIC PANEL
ALT: 12 U/L (ref 0–44)
AST: 16 U/L (ref 15–41)
Albumin: 3.2 g/dL — ABNORMAL LOW (ref 3.5–5.0)
Alkaline Phosphatase: 66 U/L (ref 38–126)
Anion gap: 6 (ref 5–15)
BUN: 15 mg/dL (ref 8–23)
CO2: 27 mmol/L (ref 22–32)
Calcium: 8.8 mg/dL — ABNORMAL LOW (ref 8.9–10.3)
Chloride: 101 mmol/L (ref 98–111)
Creatinine, Ser: 0.77 mg/dL (ref 0.44–1.00)
GFR, Estimated: >60 mL/min (ref >60)
Glucose, Bld: 97 mg/dL (ref 70–99)
Potassium: 4.3 mmol/L (ref 3.5–5.1)
Sodium: 134 mmol/L — ABNORMAL LOW (ref 135–145)
Total Bilirubin: 0.1 mg/dL — ABNORMAL LOW (ref 0.3–1.2)
Total Protein: 7.6 g/dL (ref 6.5–8.1)

## 2022-07-19 LAB — CBC WITH DIFFERENTIAL/PLATELET
Abs Immature Granulocytes: 0 10*3/uL (ref 0.00–0.07)
Basophils Absolute: 0 10*3/uL (ref 0.0–0.1)
Basophils Relative: 0 %
Eosinophils Absolute: 0 10*3/uL (ref 0.0–0.5)
Eosinophils Relative: 1 %
HCT: 38.2 % (ref 36.0–46.0)
Hemoglobin: 12.2 g/dL (ref 12.0–15.0)
Immature Granulocytes: 0 %
Lymphocytes Relative: 14 %
Lymphs Abs: 0.5 10*3/uL — ABNORMAL LOW (ref 0.7–4.0)
MCH: 28.1 pg (ref 26.0–34.0)
MCHC: 31.9 g/dL (ref 30.0–36.0)
MCV: 88 fL (ref 80.0–100.0)
Monocytes Absolute: 0.4 10*3/uL (ref 0.1–1.0)
Monocytes Relative: 10 %
Neutro Abs: 2.8 10*3/uL (ref 1.7–7.7)
Neutrophils Relative %: 75 %
Platelets: 188 10*3/uL (ref 150–400)
RBC: 4.34 MIL/uL (ref 3.87–5.11)
RDW: 14.2 % (ref 11.5–15.5)
WBC: 3.7 10*3/uL — ABNORMAL LOW (ref 4.0–10.5)
nRBC: 0 % (ref 0.0–0.2)

## 2022-07-19 NOTE — Assessment & Plan Note (Signed)
patient declines bisphosphonate.

## 2022-07-19 NOTE — Assessment & Plan Note (Signed)
Recommend trazodone and patient declines.

## 2022-07-19 NOTE — Progress Notes (Signed)
Hematology/Oncology Progress note [phone] [phone]     Patient Care Team: Pcp, No as PCP - General Telford Nab, RN as Oncology Nurse Navigator [name], MD as Medical Oncologist (Oncology)   Name of the patient: Brittney Fisher  [id number]  [birth year]   ASSESSMENT & PLAN:   Cancer Staging  Primary malignant neoplasm of lung metastatic to other site [facility]) Staging form: Lung, AJCC 8th Edition - Clinical stage from 11/04/2020: Stage IV (cT4, cN3, cM1) - Signed by [name], MD on 11/04/2020   Primary malignant neoplasm of lung metastatic to other site [facility]) #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  cT4 N3 M1-  KRASG12C mutation, currently on second line treatment with Lumakras [Patient has high TMB, TPS less than 1%.  Immunotherapy in combination with chemotherapy will be other options in the future if lumakras is not effective] Labs reviewed and discussed with patient. Continue Lumakras.  Obtain CT chest abdomen pelvis  Encounter for antineoplastic chemotherapy Treatment is listed above.  Neoplasm related pain Right thigh pain, lower back pain has improved.  status post palliative radiation. She stopped gabapentin and oxycodone.  She take Tylenol PRN  Bone metastasis patient declines bisphosphonate.  Insomnia Recommend trazodone and patient declines.   No orders of the defined types were placed in this encounter.  Follow up in 5 weeks.  All questions were answered. The patient knows to call the clinic with any problems, questions or concerns.  [name], MD, PhD [facility] Hematology Oncology 07/19/2022    PERTINENT ONCOLOGY HISTORY 61 y.o. female with past medical history including GERD, COPD, current everyday smoker presents for follow-up of lung mass and pleural effusion. 10/20/2020, patient was brought to ED via EMS due to generalized weakness, dizziness, chest pain shortness of breath.  She was recently  diagnosed with COPD approximately 1 month ago by primary care provider.  Also unintentional weight loss during the last few months. Image work-up showed right-sided pleural effusion.  She underwent right thoracentesis.  With removal of 2.4 L of fluid. 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  Metastatic disease to low cervical/thoracic nodes, lung, right pleural space and bones.  Moderate to large right pleural effusion.  SVC narrowing. 10/21/2020 MRI brain is negative for metastasis.  Mild chronic microvascular ischemic changes in the white matter.  Negative for acute infarct Regarding to the SVC narrowing, patient was seen by vascular surgeon and was recommended no intervention inpatient.  Patient to follow-up outpatient with vascular surgeon for evaluation. Patient also was treated for COPD exacerbation with hypoxia on exertion. Qualifies for home oxygen and hospitalist arrange home health and a nebulizer.  Patient was given a course of prednisone taper and empiric Levaquin. Patient was discharged and present today to follow-up with cytology results and further management plan  10/27/2020 PET scan showed right upper lobe primary bronchogenic mass with direct invasion into mediastinum. Metastatic disease to the right pleural space, lung, bone, nodes of the chest and less so lower leg/upper abdomen. Hypermetabolic him along the course of the right axillary vein with concurrent subtle hyper attenuation within the axillary vein and SVC.  This continues to the level of SVC narrowing.  Suspicious for SVC occlusion and developing thrombus. Moderate right pleural effusion and small pericardial effusion.  # # left supraclavicular mass biopsy- pathology is positive for adenocarcinoma. positive for CK7, with patchy, weak CK20.  They are  negative for TTF1, NapsinA, GATA3,  CDX2, and Pax8.  The findings are  nonspecific;  but may be compatible with a poorly differentiated  adenocarcinoma of lung origin, especially given the imaging findings  She also had another repeat thoracentesis and fluid cytology showed malignancy.  #NGS showed AXIN 1 [id number], DIS2 M132f, KRAS G12C, PRDM1 M1 [id number], [id number] MS stable, TPS <1%   11/14/2020- 12/12/2020.  #Bronchial obstruction and SVC occlusion.  s/p palliative radiation #After 2 cycles of chemtherapy, patient had CT scan done due to shortness of breath.  01/05/2021, CT chest angiogram PE protocol showed no evidence of pulmonary embolism.  Stable disease.  Right upper lobe mass about 6.7 x 5.3 cm.  Widespread metastatic disease, lung and bone involvement. Interval development of right-sided hydronephrosis. 03/06/2021 finished T2 palliative radiation 03/22/2021, 6 cycles of carboplatin/Alimta/bevacizumab.  Infusion reaction of carboplatin during cycle 6.  Carboplatin discontinued. 04/07/2021 CT chest abdomen pelvis-partial response Mild decrease in size of right lung mass, mildly decreased mediastinal and hilar adenopathy.  Extensive right-sided pleural metastasis unchanged.  Similar appearance of diffuse bilateral pulmonary nodules.  Multifocal lytic sclerotic bone metastasis.  New superior endplate deformity of T6 04/12/2021 - 09/08/2021, patient was continued on bevacizumab and Alimta.  09/21/2021, bone scan showed multifocal abnormal uptake including mid to distal shaft of right femur, left femoral neck and trochanter, left sacroiliac region, left ischium.  Focal activity at the proximal tibia on the left.  Multi focal heterogeneous bilateral rib activity.  Focal activity at the sternum. 09/27/2021, CT chest abdomen pelvis with contrast showed progression of infrahilar left lower lobe with a concerning clearly progressive masslike consolidation opacity.  Mild progression of small left pleural effusion.  Similar experience of extensive irregular right-sided pleural disease.   Similar experience of bony metastasis.  09/29/2021 extra cycle of maintenance bevacizumab and Alimta while waiting for approval of Lumakras  10/20/2021 started on Lumakras  01/09/2022, CT chest abdomen pelvis with contrast showed no substantial interval changes in exam.  Stable disease. 03/20/2022, CT chest abdomen pelvis with contrast showed unchanged circumferential pleural thickening/pleural nodularity of the right hemithorax, left infrahilar mass and bilateral pulmonary nodules.  Stable right perihilar consolidation likely postradiation changes.  Unchanged numerous sclerotic osseous lesions.  Aortic atherosclerosis.  INTERVAL HISTORY VRENETA NIEHAUSis a 61y.o. female who has above history reviewed by me today presents for follow up visit for management of metastatic lung cancer, acute visit for pain.  Right thigh pain has improved. She takes Tylenol PRN.  She has nocturia, no burning sensations. She feels urination can help relieving her thigh pain, even though her right thing pain has improved. She does not sleep well.     Review of Systems  Constitutional:  Negative for appetite change, chills, fatigue and fever.  HENT:   Negative for hearing loss and voice change.   Eyes:  Negative for eye problems.  Respiratory:  Negative for chest tightness, cough and shortness of breath.   Cardiovascular:  Negative for chest pain.  Gastrointestinal:  Negative for abdominal distention, abdominal pain and blood in stool.  Endocrine: Negative for hot flashes.  Genitourinary:  Positive for nocturia. Negative for difficulty urinating and frequency.   Musculoskeletal:  Negative for arthralgias.  Skin:  Negative for itching and rash.  Neurological:  Negative for extremity weakness.  Hematological:  Negative for adenopathy.  Psychiatric/Behavioral:  Positive for sleep disturbance. Negative for confusion.       Allergies  Allergen Reactions   Carboplatin Shortness Of Breath, Itching and Cough  Past Medical History:  Diagnosis Date   COPD (chronic obstructive pulmonary disease) (HCC)    GERD (gastroesophageal reflux disease)    Metastatic lung cancer (metastasis from lung to other site) ([place]) 11/02/2020     Past Surgical History:  Procedure Laterality Date   COLONOSCOPY WITH ESOPHAGOGASTRODUODENOSCOPY (EGD)     COLONOSCOPY WITH PROPOFOL N/A 11/14/2015   Procedure: COLONOSCOPY WITH PROPOFOL;  Surgeon: Hulen Luster, MD;  Location[facility] ENDOSCOPY;  Service: Gastroenterology;  Laterality: N/A;   ESOPHAGOGASTRODUODENOSCOPY (EGD) WITH PROPOFOL N/A 11/14/2015   Procedure: ESOPHAGOGASTRODUODENOSCOPY (EGD) WITH PROPOFOL;  Surgeon: Hulen Luster, MD;  Location: [facility] ENDOSCOPY;  Service: Gastroenterology;  Laterality: N/A;    Social History   Socioeconomic History   Marital status: Married    Spouse name: Not on file   Number of children: Not on file   Years of education: Not on file   Highest education level: Not on file  Occupational History   Not on file  Tobacco Use   Smoking status: Former    Types: Cigarettes    Quit date: 09/2021    Years since quitting: 0.8   Smokeless tobacco: Never  Vaping Use   Vaping Use: Never used  Substance and Sexual Activity   Alcohol use: Never   Drug use: Never   Sexual activity: Not Currently  Other Topics Concern   Not on file  Social History Narrative   Not on file   Social Determinants of Health   Financial Resource Strain: Not on file  Food Insecurity: Not on file  Transportation Needs: Not on file  Physical Activity: Not on file  Stress: Not on file  Social Connections: Not on file  Intimate Partner Violence: Not on file    Family History  Problem Relation Age of Onset   Breast cancer Cousin 76   Diabetes type II Mother    Hypertension Mother    Colon cancer Father    Hypertension Father      Current Outpatient Medications:    acetaminophen (TYLENOL) 500 MG tablet, Take 1,000 mg by mouth every 8 (eight) hours  as needed for moderate pain. , Disp: , Rfl:    ALPRAZolam (XANAX) 0.5 MG tablet, Take 1 tablet (0.5 mg total) by mouth See admin instructions. Take 1 tablet 60 minutes before procedure; if needed due to incomplete response and/or duration of procedure, may repeat another 1 tablet after 30 minutes., Disp: 2 tablet, Rfl: 0   benzonatate (TESSALON) 100 MG capsule, Take 1 capsule by mouth three times daily as needed for cough, Disp: 60 capsule, Rfl: 2   calcium carbonate (TUMS - DOSED IN MG ELEMENTAL CALCIUM) 500 MG chewable tablet, Chew 1 tablet by mouth as needed., Disp: , Rfl:    dexamethasone (DECADRON) 4 MG tablet, Take 1 tablet (4 mg total) by mouth daily as needed., Disp: 10 tablet, Rfl: 0   folic acid (FOLVITE) 1 MG tablet, Take 1 tablet (1 mg total) by mouth daily. Start 5-7 days before Alimta chemotherapy. Continue until 21 days after Alimta completed., Disp: 100 tablet, Rfl: 3   guaiFENesin (MUCINEX) 600 MG 12 hr tablet, Take 1 tablet (600 mg total) by mouth 2 (two) times daily., Disp: 14 tablet, Rfl: 0   ibuprofen (ADVIL) 800 MG tablet, Take 1 tablet (800 mg total) by mouth daily., Disp: 30 tablet, Rfl: 0   oxyCODONE (OXYCONTIN) 15 mg 12 hr tablet, Take 1 tablet (15 mg total) by mouth every 12 (twelve) hours., Disp: 60 tablet, Rfl:  0   phenazopyridine (PYRIDIUM) 95 MG tablet, Take 2 tablets (190 mg total) by mouth 3 (three) times daily as needed for pain., Disp: 6 tablet, Rfl: 0   Polyethylene Glycol 3350 (MIRALAX PO), Take by mouth daily as needed., Disp: , Rfl:    protein supplement shake (PREMIER PROTEIN) LIQD, Take 2 oz by mouth 4 (four) times daily., Disp: , Rfl:    sotorasib (LUMAKRAS) 120 MG tablet, Take 960 mg (8 tablets) by mouth daily., Disp: 240 tablet, Rfl: 2   gabapentin (NEURONTIN) 100 MG capsule, Take 1 capsule (100 mg total) by mouth 3 (three) times daily. (Patient not taking: Reported on 06/21/2022), Disp: 90 capsule, Rfl: 0   ipratropium-albuterol (DUONEB) 0.5-2.5 (3) MG/3ML  SOLN, Take 3 mLs by nebulization 3 (three) times daily. (Patient not taking: Reported on 06/21/2022), Disp: 360 mL, Rfl: 1   magic mouthwash w/lidocaine SOLN, Take 5 mLs by mouth 4 (four) times daily as needed for mouth pain. Sig: Swish/Swallow 5-10 ml four times a day as needed. Dispense 480 ml. 1RF (Patient not taking: Reported on 06/21/2022), Disp: 480 mL, Rfl: 1   ondansetron (ZOFRAN) 8 MG tablet, Take 1 tablet (8 mg total) by mouth 2 (two) times daily as needed for refractory nausea / vomiting. Start on day 3 after chemo. (Patient not taking: Reported on 06/21/2022), Disp: 30 tablet, Rfl: 1   oxyCODONE (OXY IR/ROXICODONE) 5 MG immediate release tablet, Take 1 tablet (5 mg total) by mouth every 6 (six) hours as needed for moderate pain or severe pain. (Patient not taking: Reported on 06/21/2022), Disp: 120 tablet, Rfl: 0   prochlorperazine (COMPAZINE) 10 MG tablet, Take 1 tablet (10 mg total) by mouth every 6 (six) hours as needed (Nausea or vomiting). (Patient not taking: Reported on 06/21/2022), Disp: 30 tablet, Rfl: 1   senna-docusate (SENOKOT-S) 8.6-50 MG tablet, Take 2 tablets by mouth daily. (Patient not taking: Reported on 06/21/2022), Disp: 60 tablet, Rfl: 3  Physical exam: ECOG 1 Vitals:   07/19/22 1414  BP: (!) 86/61  Pulse: 98  Temp: 98 F (36.7 C)  TempSrc: Tympanic  Weight: 153 lb (69.4 kg)   Physical Exam Constitutional:      General: She is not in acute distress.    Comments: Thin built female walks independently  HENT:     Head: Normocephalic and atraumatic.  Eyes:     General: No scleral icterus. Cardiovascular:     Rate and Rhythm: Normal rate and regular rhythm.     Heart sounds: Normal heart sounds.  Pulmonary:     Effort: Pulmonary effort is normal. No respiratory distress.     Breath sounds: No wheezing.     Comments: Slightly decreased breath sound bilaterally.  Abdominal:     General: Bowel sounds are normal. There is no distension.     Palpations: Abdomen is soft.   Musculoskeletal:        General: No deformity. Normal range of motion.     Cervical back: Normal range of motion and neck supple.  Skin:    General: Skin is warm and dry.     Findings: No erythema or rash.  Neurological:     Mental Status: She is alert and oriented to person, place, and time. Mental status is at baseline.     Cranial Nerves: No cranial nerve deficit.     Coordination: Coordination normal.  Psychiatric:        Mood and Affect: Mood normal.    LABORATORY STUDIES  Latest Ref Rng & Units 07/19/2022    2:06 PM 06/21/2022    1:31 PM 05/22/2022    1:25 PM  CBC  WBC 4.0 - 10.5 K/uL 3.7  4.2  3.7   Hemoglobin 12.0 - 15.0 g/dL 12.2  12.2  12.1   Hematocrit 36.0 - 46.0 % 38.2  37.7  37.7   Platelets 150 - 400 K/uL 188  194  200       Latest Ref Rng & Units 07/19/2022    2:06 PM 06/21/2022    1:31 PM 05/22/2022    1:25 PM  CMP  Glucose 70 - 99 mg/dL 97  80  84   BUN 8 - 23 mg/dL _0 Creatinine 0.44 - 1.00 mg/dL 0.77  0.76  0.92   Sodium 135 - 145 mmol/L 134  133  137   Potassium 3.5 - 5.1 mmol/L 4.3  4.2  4.0   Chloride 98 - 111 mmol/L 101  100  103   CO2 22 - 32 mmol/L _1 Calcium 8.9 - 10.3 mg/dL 8.8  8.9  8.6   Total Protein 6.5 - 8.1 g/dL 7.6  7.6  7.3   Total Bilirubin 0.3 - 1.2 mg/dL 0.1  0.4  0.2   Alkaline Phos 38 - 126 U/L 66  62  71   AST 15 - 41 U/L _2 ALT 0 - 44 U/L _3 RADIOGRAPHIC STUDIES: I have personally reviewed the radiological images as listed and agreed with the findings in the report. MR SACRUM SI JOINTS W WO CONTRAST  Result Date: 06/10/2022 CLINICAL DATA:  Low back pain, right leg pain. EXAM: MRI SACRUM WITHOUT AND WITH CONTRAST TECHNIQUE: Multiplanar multi-sequence MR imaging of the sacrum was performed. Without and with intravenous contrast. 7.5 mL of Gadavist COMPARISON:  None Available. FINDINGS: Bones/Joint/Cartilage There are multiple sclerotic lesions throughout the sacrum and bilateral ilia with  mild enhancement on post-contrast sequences consistent for metastatic disease. No SI joint widening or erosive changes. No subchondral reactive marrow changes. No SI joint effusion. Ligaments, Muscles and Tendons Muscles are normal. No muscle atrophy. No muscle edema. Piriformis muscles are normal bilaterally without signal abnormality. Soft tissue No fluid collection or hematoma. No soft tissue mass. Normal neurovascular bundles. IMPRESSION: 1. Multiple sclerotic lesions of sacrum and bilateral ilia with enhancement on post contrast sequences consistent with metastatic disease. 2.  No evidence of sacroiliitis or joint effusion. 3.  No significant finding of the neurovascular bundle bilaterally. Electronically Signed   By: Keane Police D.O.   On: 06/10/2022 22:50   MR Lumbar Spine W Wo Contrast  Result Date: 06/10/2022 CLINICAL DATA:  Back pain and right leg pain EXAM: MRI LUMBAR SPINE WITHOUT AND WITH CONTRAST TECHNIQUE: Multiplanar and multiecho pulse sequences of the lumbar spine were obtained without and with intravenous contrast. CONTRAST:  7.51m GADAVIST GADOBUTROL 1 MMOL/ML IV SOLN COMPARISON:  CT chest abdomen pelvis 03/20/2022 FINDINGS: Segmentation:  Standard. Alignment:  Grade 1 anterolisthesis at L5-S1 Vertebrae: Multiple sclerotic lesions are again seen, involving all lumbar levels. Conus medullaris and cauda equina: Conus extends to the L1 level. Conus and cauda equina appear normal. Paraspinal and other soft tissues: Negative Disc levels: L1-L2: Normal disc space and facet joints. No spinal canal stenosis. No neural foraminal stenosis. L2-L3: Normal disc space and facet joints. No spinal canal stenosis. No neural  foraminal stenosis. L3-L4: Small right asymmetric disc bulge. Right spinal canal stenosis. No neural foraminal stenosis. L4-L5: Small disc bulge and mild facet hypertrophy. No spinal canal stenosis. Mild right neural foraminal stenosis. L5-S1: Moderate facet hypertrophy. Grade 1  anterolisthesis with disc uncovering. No spinal canal stenosis. Severe right and mild left neural foraminal stenosis. Visualized sacrum: Normal. IMPRESSION: 1. Unchanged appearance of multiple sclerotic lesions involving all lumbar levels, consistent with osseous metastatic disease. 2. Severe right L5-S1 neural foraminal stenosis. 3. Mild right L4-5 neural foraminal stenosis. Electronically Signed   By: Ulyses Jarred M.D.   On: 06/10/2022 02:33

## 2022-07-19 NOTE — Assessment & Plan Note (Signed)
Treatment is listed above.

## 2022-07-19 NOTE — Assessment & Plan Note (Signed)
Right thigh pain, lower back pain has improved.  status post palliative radiation. She stopped gabapentin and oxycodone.  She take Tylenol PRN

## 2022-07-19 NOTE — Assessment & Plan Note (Signed)
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  cT4 N3 M1-  KRASG12C mutation, currently on second line treatment with Lumakras [Patient has high TMB, TPS less than 1%.  Immunotherapy in combination with chemotherapy will be other options in the future if lumakras is not effective] Labs reviewed and discussed with patient. Continue Lumakras.  Obtain CT chest abdomen pelvis 

## 2022-07-25 ENCOUNTER — Ambulatory Visit
Admission: RE | Admit: 2022-07-25 | Discharge: 2022-07-25 | Disposition: A | Discharge status: Home / Self Care | Payer: Medicaid Other | Source: Ambulatory Visit | Attending: Oncology | Admitting: Oncology

## 2022-07-25 DIAGNOSIS — C3490 Malignant neoplasm of unspecified part of unspecified bronchus or lung: Secondary | ICD-10-CM | POA: Diagnosis present

## 2022-07-25 MED ORDER — IOHEXOL 300 MG/ML  SOLN
100.0000 mL | Freq: Once | INTRAMUSCULAR | Status: AC | PRN
  Administered 2022-07-25: 100 mL via INTRAVENOUS

## 2022-07-30 ENCOUNTER — Other Ambulatory Visit: Payer: Self-pay | Admitting: Oncology

## 2022-07-30 DIAGNOSIS — C3490 Malignant neoplasm of unspecified part of unspecified bronchus or lung: Secondary | ICD-10-CM

## 2022-08-14 ENCOUNTER — Other Ambulatory Visit: Payer: Self-pay | Admitting: Oncology

## 2022-08-14 MED ORDER — OXYCODONE HCL ER 15 MG PO T12A: 15.0000 mg | EXTENDED_RELEASE_TABLET | Freq: Two times a day (BID) | ORAL | 0 refills | Status: DC

## 2022-08-17 ENCOUNTER — Encounter: Payer: Self-pay | Admitting: Oncology

## 2022-08-18 ENCOUNTER — Encounter: Payer: Self-pay | Admitting: Oncology

## 2022-08-23 ENCOUNTER — Inpatient Hospital Stay: Payer: Medicaid Other | Attending: Radiation Oncology

## 2022-08-23 ENCOUNTER — Inpatient Hospital Stay (HOSPITAL_BASED_OUTPATIENT_CLINIC_OR_DEPARTMENT_OTHER): Payer: Medicaid Other | Admitting: Oncology

## 2022-08-23 ENCOUNTER — Encounter: Payer: Self-pay | Admitting: Oncology

## 2022-08-23 DIAGNOSIS — J449 Chronic obstructive pulmonary disease, unspecified: Secondary | ICD-10-CM | POA: Insufficient documentation

## 2022-08-23 DIAGNOSIS — Z5111 Encounter for antineoplastic chemotherapy: Secondary | ICD-10-CM | POA: Diagnosis not present

## 2022-08-23 DIAGNOSIS — C7951 Secondary malignant neoplasm of bone: Secondary | ICD-10-CM | POA: Diagnosis present

## 2022-08-23 DIAGNOSIS — G893 Neoplasm related pain (acute) (chronic): Secondary | ICD-10-CM | POA: Insufficient documentation

## 2022-08-23 DIAGNOSIS — C3411 Malignant neoplasm of upper lobe, right bronchus or lung: Secondary | ICD-10-CM | POA: Diagnosis present

## 2022-08-23 DIAGNOSIS — C3491 Malignant neoplasm of unspecified part of right bronchus or lung: Secondary | ICD-10-CM

## 2022-08-23 DIAGNOSIS — Z79899 Other long term (current) drug therapy: Secondary | ICD-10-CM | POA: Insufficient documentation

## 2022-08-23 DIAGNOSIS — M79651 Pain in right thigh: Secondary | ICD-10-CM | POA: Insufficient documentation

## 2022-08-23 DIAGNOSIS — C782 Secondary malignant neoplasm of pleura: Secondary | ICD-10-CM | POA: Diagnosis not present

## 2022-08-23 DIAGNOSIS — G4700 Insomnia, unspecified: Secondary | ICD-10-CM | POA: Insufficient documentation

## 2022-08-23 DIAGNOSIS — Z87891 Personal history of nicotine dependence: Secondary | ICD-10-CM | POA: Diagnosis not present

## 2022-08-23 DIAGNOSIS — M545 Low back pain, unspecified: Secondary | ICD-10-CM | POA: Insufficient documentation

## 2022-08-23 DIAGNOSIS — C3490 Malignant neoplasm of unspecified part of unspecified bronchus or lung: Secondary | ICD-10-CM

## 2022-08-23 LAB — COMPREHENSIVE METABOLIC PANEL
ALT: 11 U/L (ref 0–44)
AST: 14 U/L — ABNORMAL LOW (ref 15–41)
Albumin: 3.2 g/dL — ABNORMAL LOW (ref 3.5–5.0)
Alkaline Phosphatase: 67 U/L (ref 38–126)
Anion gap: 6 (ref 5–15)
BUN: 19 mg/dL (ref 8–23)
CO2: 27 mmol/L (ref 22–32)
Calcium: 8.8 mg/dL — ABNORMAL LOW (ref 8.9–10.3)
Chloride: 104 mmol/L (ref 98–111)
Creatinine, Ser: 0.81 mg/dL (ref 0.44–1.00)
GFR, Estimated: >60 mL/min (ref >60)
Glucose, Bld: 82 mg/dL (ref 70–99)
Potassium: 4 mmol/L (ref 3.5–5.1)
Sodium: 137 mmol/L (ref 135–145)
Total Bilirubin: 0.4 mg/dL (ref 0.3–1.2)
Total Protein: 7.3 g/dL (ref 6.5–8.1)

## 2022-08-23 LAB — CBC WITH DIFFERENTIAL/PLATELET
Abs Immature Granulocytes: 0.01 10*3/uL (ref 0.00–0.07)
Basophils Absolute: 0 10*3/uL (ref 0.0–0.1)
Basophils Relative: 0 %
Eosinophils Absolute: 0 10*3/uL (ref 0.0–0.5)
Eosinophils Relative: 1 %
HCT: 38.9 % (ref 36.0–46.0)
Hemoglobin: 12.4 g/dL (ref 12.0–15.0)
Immature Granulocytes: 0 %
Lymphocytes Relative: 11 %
Lymphs Abs: 0.5 10*3/uL — ABNORMAL LOW (ref 0.7–4.0)
MCH: 27.6 pg (ref 26.0–34.0)
MCHC: 31.9 g/dL (ref 30.0–36.0)
MCV: 86.6 fL (ref 80.0–100.0)
Monocytes Absolute: 0.4 10*3/uL (ref 0.1–1.0)
Monocytes Relative: 9 %
Neutro Abs: 3.2 10*3/uL (ref 1.7–7.7)
Neutrophils Relative %: 79 %
Platelets: 175 10*3/uL (ref 150–400)
RBC: 4.49 MIL/uL (ref 3.87–5.11)
RDW: 13.6 % (ref 11.5–15.5)
WBC: 4 10*3/uL (ref 4.0–10.5)
nRBC: 0 % (ref 0.0–0.2)

## 2022-08-23 NOTE — Assessment & Plan Note (Signed)
Treatment is listed above.

## 2022-08-23 NOTE — Assessment & Plan Note (Signed)
patient declines bisphosphonate. Recent CT scan showed increased right femur lesion, at risk of pathological fracture.  Discussed with patient about orthopedic surgery evaluation and she is not interested.

## 2022-08-23 NOTE — Assessment & Plan Note (Signed)
Right thigh pain, lower back pain has improved.  status post palliative radiation. She stopped gabapentin and oxycodone.  She take Tylenol PRN

## 2022-08-23 NOTE — Assessment & Plan Note (Addendum)
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   cT4 N3 M1-  KRASG12C mutation, currently on second line treatment with Lumakras [Patient has high TMB, TPS less than 1%.  Immunotherapy in combination with chemotherapy will be other options in the future if lumakras is not effective] CT images were reviewed and discussed with patient.  Slight increase of right lower lung nodules. Other disease in lungs stable.  Refer to see Radonc for feasibility of RT.   Labs reviewed and discussed with patient. Continue Lumakras.

## 2022-08-23 NOTE — Progress Notes (Signed)
Hematology/Oncology Progress note [phone] [phone]     Patient Care Team: Pcp, No as PCP - General Telford Nab, RN as Oncology Nurse Navigator [name], MD as Medical Oncologist (Oncology)   Name of the patient: Brittney Fisher  [id number]  [birth year]   ASSESSMENT & PLAN:   Cancer Staging  Primary malignant neoplasm of lung metastatic to other site [facility]) Staging form: Lung, AJCC 8th Edition - Clinical stage from 11/04/2020: Stage IV (cT4, cN3, cM1) - Signed by [name], MD on 11/04/2020   Primary malignant neoplasm of lung metastatic to other site [facility]) #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   cT4 N3 M1-  KRASG12C mutation, currently on second line treatment with Lumakras [Patient has high TMB, TPS less than 1%.  Immunotherapy in combination with chemotherapy will be other options in the future if lumakras is not effective] CT images were reviewed and discussed with patient.  Slight increase of right lower lung nodules. Other disease in lungs stable.  Refer to see Radonc for feasibility of RT.   Labs reviewed and discussed with patient. Continue Lumakras.    Encounter for antineoplastic chemotherapy Treatment is listed above.  Bone metastasis patient declines bisphosphonate. Recent CT scan showed increased right femur lesion, at risk of pathological fracture.  Discussed with patient about orthopedic surgery evaluation and she is not interested.   Neoplasm related pain Right thigh pain, lower back pain has improved.  status post palliative radiation. She stopped gabapentin and oxycodone.  She take Tylenol PRN   No orders of the defined types were placed in this encounter.  Follow up in 4-5 weeks.  All questions were answered. The patient knows to call the clinic with any problems, questions or concerns.  [name], MD, PhD [facility] Hematology Oncology 08/23/2022    PERTINENT ONCOLOGY HISTORY 61 y.o.  female with past medical history including GERD, COPD, current everyday smoker presents for follow-up of lung mass and pleural effusion. 10/20/2020, patient was brought to ED via EMS due to generalized weakness, dizziness, chest pain shortness of breath.  She was recently diagnosed with COPD approximately 1 month ago by primary care provider.  Also unintentional weight loss during the last few months. Image work-up showed right-sided pleural effusion.  She underwent right thoracentesis.  With removal of 2.4 L of fluid. 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  Metastatic disease to low cervical/thoracic nodes, lung, right pleural space and bones.  Moderate to large right pleural effusion.  SVC narrowing. 10/21/2020 MRI brain is negative for metastasis.  Mild chronic microvascular ischemic changes in the white matter.  Negative for acute infarct Regarding to the SVC narrowing, patient was seen by vascular surgeon and was recommended no intervention inpatient.  Patient to follow-up outpatient with vascular surgeon for evaluation. Patient also was treated for COPD exacerbation with hypoxia on exertion. Qualifies for home oxygen and hospitalist arrange home health and a nebulizer.  Patient was given a course of prednisone taper and empiric Levaquin. Patient was discharged and present today to follow-up with cytology results and further management plan  10/27/2020 PET scan showed right upper lobe primary bronchogenic mass with direct invasion into mediastinum. Metastatic disease to the right pleural space, lung, bone, nodes of the chest and less so lower leg/upper abdomen. Hypermetabolic him along the course of the right axillary vein with concurrent subtle hyper attenuation within the axillary vein and SVC.  This continues to  the level of SVC narrowing.  Suspicious for SVC occlusion and developing thrombus. Moderate  right pleural effusion and small pericardial effusion.  # # left supraclavicular mass biopsy- pathology is positive for adenocarcinoma. positive for CK7, with patchy, weak CK20.  They are  negative for TTF1, NapsinA, GATA3, CDX2, and Pax8.  The findings are  nonspecific; but may be compatible with a poorly differentiated  adenocarcinoma of lung origin, especially given the imaging findings  She also had another repeat thoracentesis and fluid cytology showed malignancy.  #NGS showed AXIN 1 [id number], DIS2 M167f, KRAS G12C, PRDM1 M1 [id number], [id number] MS stable, TPS <1%   11/14/2020- 12/12/2020.  #Bronchial obstruction and SVC occlusion.  s/p palliative radiation #After 2 cycles of chemtherapy, patient had CT scan done due to shortness of breath.  01/05/2021, CT chest angiogram PE protocol showed no evidence of pulmonary embolism.  Stable disease.  Right upper lobe mass about 6.7 x 5.3 cm.  Widespread metastatic disease, lung and bone involvement. Interval development of right-sided hydronephrosis. 03/06/2021 finished T2 palliative radiation 03/22/2021, 6 cycles of carboplatin/Alimta/bevacizumab.  Infusion reaction of carboplatin during cycle 6.  Carboplatin discontinued. 04/07/2021 CT chest abdomen pelvis-partial response Mild decrease in size of right lung mass, mildly decreased mediastinal and hilar adenopathy.  Extensive right-sided pleural metastasis unchanged.  Similar appearance of diffuse bilateral pulmonary nodules.  Multifocal lytic sclerotic bone metastasis.  New superior endplate deformity of T6 04/12/2021 - 09/08/2021, patient was continued on bevacizumab and Alimta.  09/21/2021, bone scan showed multifocal abnormal uptake including mid to distal shaft of right femur, left femoral neck and trochanter, left sacroiliac region, left ischium.  Focal activity at the proximal tibia on the left.  Multi focal heterogeneous bilateral rib activity.  Focal activity at the sternum. 09/27/2021, CT  chest abdomen pelvis with contrast showed progression of infrahilar left lower lobe with a concerning clearly progressive masslike consolidation opacity.  Mild progression of small left pleural effusion.  Similar experience of extensive irregular right-sided pleural disease.  Similar experience of bony metastasis.  09/29/2021 extra cycle of maintenance bevacizumab and Alimta while waiting for approval of Lumakras  10/20/2021 started on Lumakras  01/09/2022, CT chest abdomen pelvis with contrast showed no substantial interval changes in exam.  Stable disease. 03/20/2022, CT chest abdomen pelvis with contrast showed unchanged circumferential pleural thickening/pleural nodularity of the right hemithorax, left infrahilar mass and bilateral pulmonary nodules.  Stable right perihilar consolidation likely postradiation changes.  Unchanged numerous sclerotic osseous lesions.  Aortic atherosclerosis.  INTERVAL HISTORY Brittney DONOVANis a 61y.o. female who has above history reviewed by me today presents for follow up visit for management of metastatic lung cancer, acute visit for pain.  Right thigh pain is stable She takes Tylenol PRN, occassionally she take narcotics.  Chronic SOB with exertion, unchanged.     Review of Systems  Constitutional:  Negative for appetite change, chills, fatigue and fever.  HENT:   Negative for hearing loss and voice change.   Eyes:  Negative for eye problems.  Respiratory:  Negative for chest tightness, cough and shortness of breath.   Cardiovascular:  Negative for chest pain.  Gastrointestinal:  Negative for abdominal distention, abdominal pain and blood in stool.  Endocrine: Negative for hot flashes.  Genitourinary:  Positive for nocturia. Negative for difficulty urinating and frequency.   Musculoskeletal:  Negative for arthralgias.  Skin:  Negative for itching and rash.  Neurological:  Negative for extremity weakness.  Hematological:  Negative for adenopathy.   Psychiatric/Behavioral:  Positive for sleep disturbance. Negative for confusion.       Allergies  Allergen Reactions   Carboplatin Shortness Of Breath, Itching and Cough     Past Medical History:  Diagnosis Date   COPD (chronic obstructive pulmonary disease) (HCC)    GERD (gastroesophageal reflux disease)    Metastatic lung cancer (metastasis from lung to other site) ([place]) 11/02/2020     Past Surgical History:  Procedure Laterality Date   COLONOSCOPY WITH ESOPHAGOGASTRODUODENOSCOPY (EGD)     COLONOSCOPY WITH PROPOFOL N/A 11/14/2015   Procedure: COLONOSCOPY WITH PROPOFOL;  Surgeon: Hulen Luster, MD;  Location: [facility] ENDOSCOPY;  Service: Gastroenterology;  Laterality: N/A;   ESOPHAGOGASTRODUODENOSCOPY (EGD) WITH PROPOFOL N/A 11/14/2015   Procedure: ESOPHAGOGASTRODUODENOSCOPY (EGD) WITH PROPOFOL;  Surgeon: Hulen Luster, MD;  Location: [facility] ENDOSCOPY;  Service: Gastroenterology;  Laterality: N/A;    Social History   Socioeconomic History   Marital status: Widowed    Spouse name: Not on file   Number of children: Not on file   Years of education: Not on file   Highest education level: Not on file  Occupational History   Not on file  Tobacco Use   Smoking status: Former    Types: Cigarettes    Quit date: 09/2021    Years since quitting: 0.9   Smokeless tobacco: Never  Vaping Use   Vaping Use: Never used  Substance and Sexual Activity   Alcohol use: Never   Drug use: Never   Sexual activity: Not Currently  Other Topics Concern   Not on file  Social History Narrative   Not on file   Social Determinants of Health   Financial Resource Strain: Not on file  Food Insecurity: Not on file  Transportation Needs: Not on file  Physical Activity: Not on file  Stress: Not on file  Social Connections: Not on file  Intimate Partner Violence: Not on file    Family History  Problem Relation Age of Onset   Breast cancer Cousin 65   Diabetes type II Mother    Hypertension Mother     Colon cancer Father    Hypertension Father      Current Outpatient Medications:    acetaminophen (TYLENOL) 500 MG tablet, Take 1,000 mg by mouth every 8 (eight) hours as needed for moderate pain. , Disp: , Rfl:    ALPRAZolam (XANAX) 0.5 MG tablet, Take 1 tablet (0.5 mg total) by mouth See admin instructions. Take 1 tablet 60 minutes before procedure; if needed due to incomplete response and/or duration of procedure, may repeat another 1 tablet after 30 minutes., Disp: 2 tablet, Rfl: 0   benzonatate (TESSALON) 100 MG capsule, Take 1 capsule by mouth three times daily as needed for cough, Disp: 60 capsule, Rfl: 0   calcium carbonate (TUMS - DOSED IN MG ELEMENTAL CALCIUM) 500 MG chewable tablet, Chew 1 tablet by mouth as needed., Disp: , Rfl:    dexamethasone (DECADRON) 4 MG tablet, Take 1 tablet (4 mg total) by mouth daily as needed., Disp: 10 tablet, Rfl: 0   folic acid (FOLVITE) 1 MG tablet, Take 1 tablet (1 mg total) by mouth daily. Start 5-7 days before Alimta chemotherapy. Continue until 21 days after Alimta completed., Disp: 100 tablet, Rfl: 3   guaiFENesin (MUCINEX) 600 MG 12 hr tablet, Take 1 tablet (600 mg total) by mouth 2 (two) times daily., Disp: 14 tablet, Rfl: 0   ibuprofen (ADVIL) 800 MG tablet, Take 1 tablet (800 mg total) by mouth daily.,  Disp: 30 tablet, Rfl: 0   LUMAKRAS 120 MG tablet, TAKE 8 TABLETS BY MOUTH DAILY, Disp: 240 tablet, Rfl: 2   oxyCODONE (OXYCONTIN) 15 mg 12 hr tablet, Take 1 tablet (15 mg total) by mouth every 12 (twelve) hours., Disp: 60 tablet, Rfl: 0   phenazopyridine (PYRIDIUM) 95 MG tablet, Take 2 tablets (190 mg total) by mouth 3 (three) times daily as needed for pain., Disp: 6 tablet, Rfl: 0   protein supplement shake (PREMIER PROTEIN) LIQD, Take 2 oz by mouth 4 (four) times daily., Disp: , Rfl:    gabapentin (NEURONTIN) 100 MG capsule, Take 1 capsule (100 mg total) by mouth 3 (three) times daily. (Patient not taking: Reported on 06/21/2022), Disp: 90  capsule, Rfl: 0   ipratropium-albuterol (DUONEB) 0.5-2.5 (3) MG/3ML SOLN, Take 3 mLs by nebulization 3 (three) times daily. (Patient not taking: Reported on 06/21/2022), Disp: 360 mL, Rfl: 1   magic mouthwash w/lidocaine SOLN, Take 5 mLs by mouth 4 (four) times daily as needed for mouth pain. Sig: Swish/Swallow 5-10 ml four times a day as needed. Dispense 480 ml. 1RF (Patient not taking: Reported on 06/21/2022), Disp: 480 mL, Rfl: 1   ondansetron (ZOFRAN) 8 MG tablet, Take 1 tablet (8 mg total) by mouth 2 (two) times daily as needed for refractory nausea / vomiting. Start on day 3 after chemo. (Patient not taking: Reported on 06/21/2022), Disp: 30 tablet, Rfl: 1   oxyCODONE (OXY IR/ROXICODONE) 5 MG immediate release tablet, Take 1 tablet (5 mg total) by mouth every 6 (six) hours as needed for moderate pain or severe pain. (Patient not taking: Reported on 06/21/2022), Disp: 120 tablet, Rfl: 0   Polyethylene Glycol 3350 (MIRALAX PO), Take by mouth daily as needed. (Patient not taking: Reported on 08/23/2022), Disp: , Rfl:    prochlorperazine (COMPAZINE) 10 MG tablet, Take 1 tablet (10 mg total) by mouth every 6 (six) hours as needed (Nausea or vomiting). (Patient not taking: Reported on 06/21/2022), Disp: 30 tablet, Rfl: 1  Physical exam: ECOG 1 Vitals:   08/23/22 1059  BP: 97/60  Pulse: (!) 112  Resp: 20  Temp: 98.7 F (37.1 C)  SpO2: 98%  Weight: 155 lb 3.2 oz (70.4 kg)   Physical Exam Constitutional:      General: She is not in acute distress.    Comments: Thin built female walks independently  HENT:     Head: Normocephalic and atraumatic.  Eyes:     General: No scleral icterus. Cardiovascular:     Rate and Rhythm: Normal rate and regular rhythm.     Heart sounds: Normal heart sounds.  Pulmonary:     Effort: Pulmonary effort is normal. No respiratory distress.     Breath sounds: No wheezing.     Comments: Slightly decreased breath sound bilaterally.  Abdominal:     General: Bowel sounds are  normal. There is no distension.     Palpations: Abdomen is soft.  Musculoskeletal:        General: No deformity. Normal range of motion.     Cervical back: Normal range of motion and neck supple.  Skin:    General: Skin is warm and dry.     Findings: No erythema or rash.  Neurological:     Mental Status: She is alert and oriented to person, place, and time. Mental status is at baseline.     Cranial Nerves: No cranial nerve deficit.     Coordination: Coordination normal.  Psychiatric:  Mood and Affect: Mood normal.    LABORATORY STUDIES     Latest Ref Rng & Units 08/23/2022   10:46 AM 07/19/2022    2:06 PM 06/21/2022    1:31 PM  CBC  WBC 4.0 - 10.5 K/uL 4.0  3.7  4.2   Hemoglobin 12.0 - 15.0 g/dL 12.4  12.2  12.2   Hematocrit 36.0 - 46.0 % 38.9  38.2  37.7   Platelets 150 - 400 K/uL 175  188  194       Latest Ref Rng & Units 08/23/2022   10:46 AM 07/19/2022    2:06 PM 06/21/2022    1:31 PM  CMP  Glucose 70 - 99 mg/dL 82  97  80   BUN 8 - 23 mg/dL '19  15  15   ' Creatinine 0.44 - 1.00 mg/dL 0.81  0.77  0.76   Sodium 135 - 145 mmol/L 137  134  133   Potassium 3.5 - 5.1 mmol/L 4.0  4.3  4.2   Chloride 98 - 111 mmol/L 104  101  100   CO2 22 - 32 mmol/L '27  27  26   ' Calcium 8.9 - 10.3 mg/dL 8.8  8.8  8.9   Total Protein 6.5 - 8.1 g/dL 7.3  7.6  7.6   Total Bilirubin 0.3 - 1.2 mg/dL 0.4  0.1  0.4   Alkaline Phos 38 - 126 U/L 67  66  62   AST 15 - 41 U/L '14  16  17   ' ALT 0 - 44 U/L '11  12  12      ' RADIOGRAPHIC STUDIES: I have personally reviewed the radiological images as listed and agreed with the findings in the report. CT CHEST ABDOMEN PELVIS W CONTRAST  Result Date: 07/26/2022 CLINICAL DATA:  Metastatic lung cancer restaging, status post chemotherapy, radiation, and immunotherapy * Tracking Code: BO * EXAM: CT CHEST, ABDOMEN, AND PELVIS WITH CONTRAST TECHNIQUE: Multidetector CT imaging of the chest, abdomen and pelvis was performed following the standard protocol during  bolus administration of intravenous contrast. RADIATION DOSE REDUCTION: This exam was performed according to the departmental dose-optimization program which includes automated exposure control, adjustment of the mA and/or kV according to patient size and/or use of iterative reconstruction technique. CONTRAST:  131m OMNIPAQUE IOHEXOL 300 MG/ML SOLN additional oral enteric contrast COMPARISON:  03/20/2022 FINDINGS: CT CHEST FINDINGS Cardiovascular: Aortic atherosclerosis. Normal heart size. No pericardial effusion. Mediastinum/Nodes: Unchanged matted post treatment appearance of mediastinal and right hilar lymph nodes. Unchanged epicardial lymph nodes about the left ventricular apex measuring up to 1.6 x 1.0 cm (series 2, image 54). Thyroid gland, trachea, and esophagus demonstrate no significant findings. Lungs/Pleura: Redemonstrated post treatment appearance of the chest with masslike consolidation and fibrosis of the perihilar right lung (series 3, image 73), mass of the superior segment left lower lobe, as well as extensive bilateral pleural thickening and nodularity, greater on the right, and numerous bilateral pulmonary nodules. The majority of these nodules are unchanged, index nodule in the anterior right middle lobe measures 1.2 x 1.2 cm, unchanged (series 3, image 96), index nodule in the left upper lobe measures 1.2 x 0.7 cm, unchanged (series 3, image 88). However, there is increased nodularity about the right lung base, for example a subpleural nodule of the peripheral right lower lobe measuring 0.8 by 0.6 cm, previously 0.5 x 0.3 cm (series 3, image 106) and an adjacent nodule measuring 0.8 x 0.8 cm, previously no greater than 0.4 cm (  series 3, image 113). Musculoskeletal: No chest wall abnormality. No acute osseous findings. CT ABDOMEN PELVIS FINDINGS Hepatobiliary: No solid liver abnormality is seen. No gallstones, gallbladder wall thickening, or biliary dilatation. Pancreas: Unremarkable. No  pancreatic ductal dilatation or surrounding inflammatory changes. Spleen: Normal in size without significant abnormality. Adrenals/Urinary Tract: Adrenal glands are unremarkable. Simple, benign bilateral renal cortical and parapelvic cysts, for which no further follow-up or characterization is required. Kidneys are otherwise normal, without renal calculi, solid lesion, or hydronephrosis. Bladder is unremarkable. Stomach/Bowel: Stomach is within normal limits. Appendix appears normal. No evidence of bowel wall thickening, distention, or inflammatory changes. Vascular/Lymphatic: Aortic atherosclerosis. No enlarged abdominal or pelvic lymph nodes. Reproductive: No mass or other abnormality. Other: No abdominal wall hernia or abnormality. No ascites. Musculoskeletal: No acute osseous findings. Slightly enlarged lytic osseous metastatic lesion of the base of the right femoral neck measuring 2.4 x 2.2 cm, previously 2.2 x 1.8 cm (series 4, image 65). Otherwise unchanged widespread sclerotic osseous metastatic disease throughout the axial and proximal appendicular skeleton. IMPRESSION: 1. Redemonstrated post treatment appearance of the chest with masslike consolidation and fibrosis of the perihilar right lung , mass of the superior segment left lower lobe, as well as extensive bilateral pleural thickening and nodularity, greater on the right, and numerous bilateral pulmonary nodules. 2. Increased nodularity about the right lung base, consistent with worsened pleural metastatic disease. Other nodules unchanged. 3. Unchanged post treatment appearance of mediastinal and right hilar lymph nodes as well as epicardial lymph nodes about the left ventricular apex. 4. Slightly enlarged lytic osseous metastatic lesion of the base of the right femoral neck measuring 2.4 x 2.2 cm, previously 2.2 x 1.8 cm. This is potentially at risk for pathologic fracture given location. 5. Otherwise unchanged widespread sclerotic osseous metastatic  disease throughout the axial and proximal appendicular skeleton. 6. No evidence of soft tissue metastatic disease to the abdomen or pelvis. Aortic Atherosclerosis (ICD10-I70.0). Electronically Signed   By: Delanna Ahmadi M.D.   On: 07/26/2022 11:11   MR SACRUM SI JOINTS W WO CONTRAST  Result Date: 06/10/2022 CLINICAL DATA:  Low back pain, right leg pain. EXAM: MRI SACRUM WITHOUT AND WITH CONTRAST TECHNIQUE: Multiplanar multi-sequence MR imaging of the sacrum was performed. Without and with intravenous contrast. 7.5 mL of Gadavist COMPARISON:  None Available. FINDINGS: Bones/Joint/Cartilage There are multiple sclerotic lesions throughout the sacrum and bilateral ilia with mild enhancement on post-contrast sequences consistent for metastatic disease. No SI joint widening or erosive changes. No subchondral reactive marrow changes. No SI joint effusion. Ligaments, Muscles and Tendons Muscles are normal. No muscle atrophy. No muscle edema. Piriformis muscles are normal bilaterally without signal abnormality. Soft tissue No fluid collection or hematoma. No soft tissue mass. Normal neurovascular bundles. IMPRESSION: 1. Multiple sclerotic lesions of sacrum and bilateral ilia with enhancement on post contrast sequences consistent with metastatic disease. 2.  No evidence of sacroiliitis or joint effusion. 3.  No significant finding of the neurovascular bundle bilaterally. Electronically Signed   By: Keane Police D.O.   On: 06/10/2022 22:50   MR Lumbar Spine W Wo Contrast  Result Date: 06/10/2022 CLINICAL DATA:  Back pain and right leg pain EXAM: MRI LUMBAR SPINE WITHOUT AND WITH CONTRAST TECHNIQUE: Multiplanar and multiecho pulse sequences of the lumbar spine were obtained without and with intravenous contrast. CONTRAST:  7.15m GADAVIST GADOBUTROL 1 MMOL/ML IV SOLN COMPARISON:  CT chest abdomen pelvis 03/20/2022 FINDINGS: Segmentation:  Standard. Alignment:  Grade 1 anterolisthesis at L5-S1 Vertebrae: Multiple  sclerotic lesions are again seen, involving all lumbar levels. Conus medullaris and cauda equina: Conus extends to the L1 level. Conus and cauda equina appear normal. Paraspinal and other soft tissues: Negative Disc levels: L1-L2: Normal disc space and facet joints. No spinal canal stenosis. No neural foraminal stenosis. L2-L3: Normal disc space and facet joints. No spinal canal stenosis. No neural foraminal stenosis. L3-L4: Small right asymmetric disc bulge. Right spinal canal stenosis. No neural foraminal stenosis. L4-L5: Small disc bulge and mild facet hypertrophy. No spinal canal stenosis. Mild right neural foraminal stenosis. L5-S1: Moderate facet hypertrophy. Grade 1 anterolisthesis with disc uncovering. No spinal canal stenosis. Severe right and mild left neural foraminal stenosis. Visualized sacrum: Normal. IMPRESSION: 1. Unchanged appearance of multiple sclerotic lesions involving all lumbar levels, consistent with osseous metastatic disease. 2. Severe right L5-S1 neural foraminal stenosis. 3. Mild right L4-5 neural foraminal stenosis. Electronically Signed   By: Ulyses Jarred M.D.   On: 06/10/2022 02:33

## 2022-08-24 ENCOUNTER — Telehealth: Payer: Self-pay

## 2022-08-24 NOTE — Telephone Encounter (Signed)
-----  Message from [name], MD sent at 08/23/2022 10:00 PM EDT ----- See below. Please let her know that Dr.Chrystal feels the RT is not beneficial at this point and will not offer. Please cancel her appt with Radonc   ----- Message ----- From: Noreene Filbert, MD Sent: 08/23/2022   1:51 PM EDT To: [name], MD  With all her other metastatic disease really do not feel those lesions in the lung bases need treatment they are asymptomatic and small.  I agree with keeping her on K-ras inhibitor with close observation. ----- Message ----- From: [name], MD Sent: 08/23/2022  11:27 AM EDT To: Noreene Filbert, MD  Her recent CT showed increased nodularity right lower lung, 2 nodules, I keep her on the same oral KRAS inhibitor w plan of short term follow up. Appreciate your opinion on if these nodules could be treated with SBRT.  Right femur neck lesion is bigger as well, pain is stable. I ask team to schedule her a follow up appt with you   Thank you  zy

## 2022-08-24 NOTE — Telephone Encounter (Signed)
Pt informed of Dr. Olena Leatherwood response and verbalized understanding.   Please cancel appt with rad onc on 9/12. Pt aware

## 2022-08-28 ENCOUNTER — Institutional Professional Consult (permissible substitution): Payer: Medicaid Other | Admitting: Radiation Oncology

## 2022-09-06 ENCOUNTER — Encounter: Payer: Self-pay | Admitting: Oncology

## 2022-09-06 ENCOUNTER — Other Ambulatory Visit (HOSPITAL_COMMUNITY): Payer: Self-pay

## 2022-09-24 ENCOUNTER — Other Ambulatory Visit: Payer: Self-pay

## 2022-09-24 ENCOUNTER — Encounter: Payer: Self-pay | Admitting: Oncology

## 2022-09-24 ENCOUNTER — Inpatient Hospital Stay (HOSPITAL_BASED_OUTPATIENT_CLINIC_OR_DEPARTMENT_OTHER): Payer: Medicaid Other | Admitting: Oncology

## 2022-09-24 ENCOUNTER — Inpatient Hospital Stay: Payer: Medicaid Other | Attending: Radiation Oncology

## 2022-09-24 DIAGNOSIS — G4700 Insomnia, unspecified: Secondary | ICD-10-CM | POA: Diagnosis not present

## 2022-09-24 DIAGNOSIS — C3491 Malignant neoplasm of unspecified part of right bronchus or lung: Secondary | ICD-10-CM

## 2022-09-24 DIAGNOSIS — Z79899 Other long term (current) drug therapy: Secondary | ICD-10-CM | POA: Diagnosis not present

## 2022-09-24 DIAGNOSIS — M545 Low back pain, unspecified: Secondary | ICD-10-CM | POA: Diagnosis not present

## 2022-09-24 DIAGNOSIS — G893 Neoplasm related pain (acute) (chronic): Secondary | ICD-10-CM | POA: Insufficient documentation

## 2022-09-24 DIAGNOSIS — C7951 Secondary malignant neoplasm of bone: Secondary | ICD-10-CM | POA: Diagnosis present

## 2022-09-24 DIAGNOSIS — Z5111 Encounter for antineoplastic chemotherapy: Secondary | ICD-10-CM | POA: Diagnosis not present

## 2022-09-24 DIAGNOSIS — J449 Chronic obstructive pulmonary disease, unspecified: Secondary | ICD-10-CM | POA: Diagnosis not present

## 2022-09-24 DIAGNOSIS — C3411 Malignant neoplasm of upper lobe, right bronchus or lung: Secondary | ICD-10-CM | POA: Diagnosis present

## 2022-09-24 DIAGNOSIS — C782 Secondary malignant neoplasm of pleura: Secondary | ICD-10-CM | POA: Insufficient documentation

## 2022-09-24 DIAGNOSIS — Z87891 Personal history of nicotine dependence: Secondary | ICD-10-CM | POA: Insufficient documentation

## 2022-09-24 LAB — COMPREHENSIVE METABOLIC PANEL WITH GFR
ALT: 11 U/L (ref 0–44)
AST: 15 U/L (ref 15–41)
Albumin: 3.2 g/dL — ABNORMAL LOW (ref 3.5–5.0)
Alkaline Phosphatase: 63 U/L (ref 38–126)
Anion gap: 7 (ref 5–15)
BUN: 19 mg/dL (ref 8–23)
CO2: 26 mmol/L (ref 22–32)
Calcium: 9.1 mg/dL (ref 8.9–10.3)
Chloride: 105 mmol/L (ref 98–111)
Creatinine, Ser: 0.64 mg/dL (ref 0.44–1.00)
GFR, Estimated: >60 mL/min
Glucose, Bld: 111 mg/dL — ABNORMAL HIGH (ref 70–99)
Potassium: 3.9 mmol/L (ref 3.5–5.1)
Sodium: 138 mmol/L (ref 135–145)
Total Bilirubin: 0.4 mg/dL (ref 0.3–1.2)
Total Protein: 7.4 g/dL (ref 6.5–8.1)

## 2022-09-24 LAB — CBC WITH DIFFERENTIAL/PLATELET
Abs Immature Granulocytes: 0 10*3/uL (ref 0.00–0.07)
Basophils Absolute: 0 10*3/uL (ref 0.0–0.1)
Basophils Relative: 0 %
Eosinophils Absolute: 0.1 10*3/uL (ref 0.0–0.5)
Eosinophils Relative: 3 %
HCT: 37.5 % (ref 36.0–46.0)
Hemoglobin: 12 g/dL (ref 12.0–15.0)
Immature Granulocytes: 0 %
Lymphocytes Relative: 12 %
Lymphs Abs: 0.5 10*3/uL — ABNORMAL LOW (ref 0.7–4.0)
MCH: 27.4 pg (ref 26.0–34.0)
MCHC: 32 g/dL (ref 30.0–36.0)
MCV: 85.6 fL (ref 80.0–100.0)
Monocytes Absolute: 0.3 10*3/uL (ref 0.1–1.0)
Monocytes Relative: 8 %
Neutro Abs: 3.1 10*3/uL (ref 1.7–7.7)
Neutrophils Relative %: 77 %
Platelets: 190 10*3/uL (ref 150–400)
RBC: 4.38 MIL/uL (ref 3.87–5.11)
RDW: 13.6 % (ref 11.5–15.5)
WBC: 4.1 10*3/uL (ref 4.0–10.5)
nRBC: 0 % (ref 0.0–0.2)

## 2022-09-24 NOTE — Assessment & Plan Note (Signed)
patient declines bisphosphonate. Recent CT scan showed increased right femur lesion, at risk of pathological fracture.  She is not interested in stabilizing surgery.

## 2022-09-24 NOTE — Assessment & Plan Note (Signed)
Right thigh pain, lower back pain has improved.  status post palliative radiation. She stopped gabapentin and oxycodone.  She take Tylenol PRN, symptoms not fully controlled. encourage patient to use OxyContin twice daily.

## 2022-09-24 NOTE — Progress Notes (Signed)
Hematology/Oncology Progress note [phone] [phone]     Patient Care Team: Pcp, No as PCP - General Telford Nab, RN as Oncology Nurse Navigator [name], MD as Medical Oncologist (Oncology)   Name of the patient: Brittney Fisher  [id number]  11-15-1961   ASSESSMENT & PLAN:   Cancer Staging  Primary malignant neoplasm of lung metastatic to other site [facility]) Staging form: Lung, AJCC 8th Edition - Clinical stage from 11/04/2020: Stage IV (cT4, cN3, cM1) - Signed by [name], MD on 11/04/2020   Primary malignant neoplasm of lung metastatic to other site [facility]) #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   cT4 N3 M1-  KRASG12C mutation, currently on second line treatment with Lumakras [Patient has high TMB, TPS less than 1%.  Immunotherapy in combination with chemotherapy will be other options in the future if lumakras is not effective] Labs reviewed and discussed with patient. Continue Lumakras.  Obtain restaging CT at the end of Oct 2023   Encounter for antineoplastic chemotherapy Treatment is listed above.  Neoplasm related pain Right thigh pain, lower back pain has improved.  status post palliative radiation. She stopped gabapentin and oxycodone.  She take Tylenol PRN, symptoms not fully controlled. encourage patient to use OxyContin twice daily.  Bone metastasis patient declines bisphosphonate. Recent CT scan showed increased right femur lesion, at risk of pathological fracture.  She is not interested in stabilizing surgery.    Orders Placed This Encounter  Procedures   CT CHEST ABDOMEN PELVIS W CONTRAST    Standing Status:   Future    Standing Expiration Date:   09/25/2023    Order Specific Question:   Preferred imaging location?    Answer:   [facility]    Order Specific Question:   Is Oral Contrast requested for this exam?    Answer:   Yes, Per Radiology protocol   CBC with Differential/Platelet     Standing Status:   Future    Standing Expiration Date:   09/25/2023   Comprehensive metabolic panel    Standing Status:   Future    Standing Expiration Date:   09/24/2023    Follow up in 4 weeks.  All questions were answered. The patient knows to call the clinic with any problems, questions or concerns.  [name], MD, PhD [facility] Hematology Oncology 09/24/2022    PERTINENT ONCOLOGY HISTORY 61 y.o. female with past medical history including GERD, COPD, current everyday smoker presents for follow-up of lung mass and pleural effusion. 10/20/2020, patient was brought to ED via EMS due to generalized weakness, dizziness, chest pain shortness of breath.  She was recently diagnosed with COPD approximately 1 month ago by primary care provider.  Also unintentional weight loss during the last few months. Image work-up showed right-sided pleural effusion.  She underwent right thoracentesis.  With removal of 2.4 L of fluid. 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  Metastatic disease to low cervical/thoracic nodes, lung, right pleural space and bones.  Moderate to large right pleural effusion.  SVC narrowing. 10/21/2020 MRI brain is negative for metastasis.  Mild chronic microvascular ischemic changes in the white matter.  Negative for acute infarct Regarding to the SVC narrowing, patient was seen by vascular surgeon and was recommended no intervention inpatient.  Patient to follow-up outpatient with vascular surgeon for evaluation. Patient also was treated for COPD exacerbation with hypoxia on exertion. Qualifies for home oxygen and hospitalist  arrange home health and a nebulizer.  Patient was given a course of prednisone taper and empiric Levaquin. Patient was discharged and present today to follow-up with cytology results and further management plan  10/27/2020 PET scan showed right upper lobe primary  bronchogenic mass with direct invasion into mediastinum. Metastatic disease to the right pleural space, lung, bone, nodes of the chest and less so lower leg/upper abdomen. Hypermetabolic him along the course of the right axillary vein with concurrent subtle hyper attenuation within the axillary vein and SVC.  This continues to the level of SVC narrowing.  Suspicious for SVC occlusion and developing thrombus. Moderate right pleural effusion and small pericardial effusion.  # # left supraclavicular mass biopsy- pathology is positive for adenocarcinoma. positive for CK7, with patchy, weak CK20.  They are  negative for TTF1, NapsinA, GATA3, CDX2, and Pax8.  The findings are  nonspecific; but may be compatible with a poorly differentiated  adenocarcinoma of lung origin, especially given the imaging findings  She also had another repeat thoracentesis and fluid cytology showed malignancy.  #NGS showed AXIN 1 [id number], DIS2 M161f, KRAS G12C, PRDM1 M1 [id number], [id number] MS stable, TPS <1%   11/14/2020- 12/12/2020.  #Bronchial obstruction and SVC occlusion.  s/p palliative radiation #After 2 cycles of chemtherapy, patient had CT scan done due to shortness of breath.  01/05/2021, CT chest angiogram PE protocol showed no evidence of pulmonary embolism.  Stable disease.  Right upper lobe mass about 6.7 x 5.3 cm.  Widespread metastatic disease, lung and bone involvement. Interval development of right-sided hydronephrosis. 03/06/2021 finished T2 palliative radiation 03/22/2021, 6 cycles of carboplatin/Alimta/bevacizumab.  Infusion reaction of carboplatin during cycle 6.  Carboplatin discontinued. 04/07/2021 CT chest abdomen pelvis-partial response Mild decrease in size of right lung mass, mildly decreased mediastinal and hilar adenopathy.  Extensive right-sided pleural metastasis unchanged.  Similar appearance of diffuse bilateral pulmonary nodules.  Multifocal lytic sclerotic bone metastasis.  New superior  endplate deformity of T6 04/12/2021 - 09/08/2021, patient was continued on bevacizumab and Alimta.  09/21/2021, bone scan showed multifocal abnormal uptake including mid to distal shaft of right femur, left femoral neck and trochanter, left sacroiliac region, left ischium.  Focal activity at the proximal tibia on the left.  Multi focal heterogeneous bilateral rib activity.  Focal activity at the sternum. 09/27/2021, CT chest abdomen pelvis with contrast showed progression of infrahilar left lower lobe with a concerning clearly progressive masslike consolidation opacity.  Mild progression of small left pleural effusion.  Similar experience of extensive irregular right-sided pleural disease.  Similar experience of bony metastasis.  09/29/2021 extra cycle of maintenance bevacizumab and Alimta while waiting for approval of Lumakras  10/20/2021 started on Lumakras  01/09/2022, CT chest abdomen pelvis with contrast showed no substantial interval changes in exam.  Stable disease. 03/20/2022, CT chest abdomen pelvis with contrast showed unchanged circumferential pleural thickening/pleural nodularity of the right hemithorax, left infrahilar mass and bilateral pulmonary nodules.  Stable right perihilar consolidation likely postradiation changes.  Unchanged numerous sclerotic osseous lesions.  Aortic atherosclerosis.  INTERVAL HISTORY VEVOLETT SOMARRIBAis a 61y.o. female who has above history reviewed by me today presents for follow up visit for management of metastatic lung cancer, acute visit for pain.  She takes Tylenol PRN She reports increased mid back pain, as well as right-sided pain. Chronic SOB with exertion, unchanged.     Review of Systems  Constitutional:  Negative for appetite change, chills, fatigue and fever.  HENT:   Negative for hearing  loss and voice change.   Eyes:  Negative for eye problems.  Respiratory:  Positive for shortness of breath. Negative for chest tightness and cough.    Cardiovascular:  Negative for chest pain.  Gastrointestinal:  Negative for abdominal distention, abdominal pain and blood in stool.  Endocrine: Negative for hot flashes.  Genitourinary:  Positive for nocturia. Negative for difficulty urinating and frequency.   Musculoskeletal:  Negative for arthralgias.  Skin:  Negative for itching and rash.  Neurological:  Negative for extremity weakness.  Hematological:  Negative for adenopathy.  Psychiatric/Behavioral:  Positive for sleep disturbance. Negative for confusion.       Allergies  Allergen Reactions   Carboplatin Shortness Of Breath, Itching and Cough     Past Medical History:  Diagnosis Date   COPD (chronic obstructive pulmonary disease) (HCC)    GERD (gastroesophageal reflux disease)    Metastatic lung cancer (metastasis from lung to other site) ([place]) 11/02/2020     Past Surgical History:  Procedure Laterality Date   COLONOSCOPY WITH ESOPHAGOGASTRODUODENOSCOPY (EGD)     COLONOSCOPY WITH PROPOFOL N/A 11/14/2015   Procedure: COLONOSCOPY WITH PROPOFOL;  Surgeon: Hulen Luster, MD;  Location: [facility] ENDOSCOPY;  Service: Gastroenterology;  Laterality: N/A;   ESOPHAGOGASTRODUODENOSCOPY (EGD) WITH PROPOFOL N/A 11/14/2015   Procedure: ESOPHAGOGASTRODUODENOSCOPY (EGD) WITH PROPOFOL;  Surgeon: Hulen Luster, MD;  Location[facility] ENDOSCOPY;  Service: Gastroenterology;  Laterality: N/A;    Social History   Socioeconomic History   Marital status: Widowed    Spouse name: Not on file   Number of children: Not on file   Years of education: Not on file   Highest education level: Not on file  Occupational History   Not on file  Tobacco Use   Smoking status: Former    Types: Cigarettes    Quit date: 09/2021    Years since quitting: 1.0   Smokeless tobacco: Never  Vaping Use   Vaping Use: Never used  Substance and Sexual Activity   Alcohol use: Never   Drug use: Never   Sexual activity: Not Currently  Other Topics Concern   Not on file   Social History Narrative   Not on file   Social Determinants of Health   Financial Resource Strain: Not on file  Food Insecurity: Not on file  Transportation Needs: Not on file  Physical Activity: Not on file  Stress: Not on file  Social Connections: Not on file  Intimate Partner Violence: Not on file    Family History  Problem Relation Age of Onset   Breast cancer Cousin 76   Diabetes type II Mother    Hypertension Mother    Colon cancer Father    Hypertension Father      Current Outpatient Medications:    acetaminophen (TYLENOL) 500 MG tablet, Take 1,000 mg by mouth every 8 (eight) hours as needed for moderate pain. , Disp: , Rfl:    ALPRAZolam (XANAX) 0.5 MG tablet, Take 1 tablet (0.5 mg total) by mouth See admin instructions. Take 1 tablet 60 minutes before procedure; if needed due to incomplete response and/or duration of procedure, may repeat another 1 tablet after 30 minutes., Disp: 2 tablet, Rfl: 0   benzonatate (TESSALON) 100 MG capsule, Take 1 capsule by mouth three times daily as needed for cough, Disp: 60 capsule, Rfl: 0   calcium carbonate (TUMS - DOSED IN MG ELEMENTAL CALCIUM) 500 MG chewable tablet, Chew 1 tablet by mouth as needed., Disp: , Rfl:    dexamethasone (  DECADRON) 4 MG tablet, Take 1 tablet (4 mg total) by mouth daily as needed., Disp: 10 tablet, Rfl: 0   folic acid (FOLVITE) 1 MG tablet, Take 1 tablet (1 mg total) by mouth daily. Start 5-7 days before Alimta chemotherapy. Continue until 21 days after Alimta completed., Disp: 100 tablet, Rfl: 3   guaiFENesin (MUCINEX) 600 MG 12 hr tablet, Take 1 tablet (600 mg total) by mouth 2 (two) times daily., Disp: 14 tablet, Rfl: 0   ibuprofen (ADVIL) 800 MG tablet, Take 1 tablet (800 mg total) by mouth daily., Disp: 30 tablet, Rfl: 0   LUMAKRAS 120 MG tablet, TAKE 8 TABLETS BY MOUTH DAILY, Disp: 240 tablet, Rfl: 2   oxyCODONE (OXYCONTIN) 15 mg 12 hr tablet, Take 1 tablet (15 mg total) by mouth every 12 (twelve)  hours., Disp: 60 tablet, Rfl: 0   phenazopyridine (PYRIDIUM) 95 MG tablet, Take 2 tablets (190 mg total) by mouth 3 (three) times daily as needed for pain., Disp: 6 tablet, Rfl: 0   protein supplement shake (PREMIER PROTEIN) LIQD, Take 2 oz by mouth 4 (four) times daily., Disp: , Rfl:    gabapentin (NEURONTIN) 100 MG capsule, Take 1 capsule (100 mg total) by mouth 3 (three) times daily. (Patient not taking: Reported on 06/21/2022), Disp: 90 capsule, Rfl: 0   ipratropium-albuterol (DUONEB) 0.5-2.5 (3) MG/3ML SOLN, Take 3 mLs by nebulization 3 (three) times daily. (Patient not taking: Reported on 06/21/2022), Disp: 360 mL, Rfl: 1   magic mouthwash w/lidocaine SOLN, Take 5 mLs by mouth 4 (four) times daily as needed for mouth pain. Sig: Swish/Swallow 5-10 ml four times a day as needed. Dispense 480 ml. 1RF (Patient not taking: Reported on 06/21/2022), Disp: 480 mL, Rfl: 1   ondansetron (ZOFRAN) 8 MG tablet, Take 1 tablet (8 mg total) by mouth 2 (two) times daily as needed for refractory nausea / vomiting. Start on day 3 after chemo. (Patient not taking: Reported on 06/21/2022), Disp: 30 tablet, Rfl: 1   oxyCODONE (OXY IR/ROXICODONE) 5 MG immediate release tablet, Take 1 tablet (5 mg total) by mouth every 6 (six) hours as needed for moderate pain or severe pain. (Patient not taking: Reported on 06/21/2022), Disp: 120 tablet, Rfl: 0   Polyethylene Glycol 3350 (MIRALAX PO), Take by mouth daily as needed. (Patient not taking: Reported on 08/23/2022), Disp: , Rfl:    prochlorperazine (COMPAZINE) 10 MG tablet, Take 1 tablet (10 mg total) by mouth every 6 (six) hours as needed (Nausea or vomiting). (Patient not taking: Reported on 06/21/2022), Disp: 30 tablet, Rfl: 1  Physical exam: ECOG 1 Vitals:   09/24/22 1407  BP: 121/74  Pulse: (!) 110  Resp: 20  Temp: 97.8 F (36.6 C)  SpO2: 100%  Weight: 151 lb 3.2 oz (68.6 kg)   Physical Exam Constitutional:      General: She is not in acute distress.    Comments: Thin  built female walks independently  HENT:     Head: Normocephalic and atraumatic.  Eyes:     General: No scleral icterus. Cardiovascular:     Rate and Rhythm: Regular rhythm. Tachycardia present.     Heart sounds: Normal heart sounds.  Pulmonary:     Effort: Pulmonary effort is normal. No respiratory distress.     Breath sounds: No wheezing.     Comments: Slightly decreased breath sound bilaterally.  Abdominal:     General: Bowel sounds are normal. There is no distension.     Palpations: Abdomen is soft.  Musculoskeletal:        General: No deformity. Normal range of motion.     Cervical back: Normal range of motion and neck supple.  Skin:    General: Skin is warm and dry.     Findings: No erythema or rash.  Neurological:     Mental Status: She is alert and oriented to person, place, and time. Mental status is at baseline.     Cranial Nerves: No cranial nerve deficit.     Coordination: Coordination normal.  Psychiatric:        Mood and Affect: Mood normal.    LABORATORY STUDIES     Latest Ref Rng & Units 09/24/2022    1:50 PM 08/23/2022   10:46 AM 07/19/2022    2:06 PM  CBC  WBC 4.0 - 10.5 K/uL 4.1  4.0  3.7   Hemoglobin 12.0 - 15.0 g/dL 12.0  12.4  12.2   Hematocrit 36.0 - 46.0 % 37.5  38.9  38.2   Platelets 150 - 400 K/uL 190  175  188       Latest Ref Rng & Units 09/24/2022    1:50 PM 08/23/2022   10:46 AM 07/19/2022    2:06 PM  CMP  Glucose 70 - 99 mg/dL 111  82  97   BUN 8 - 23 mg/dL _0 Creatinine 0.44 - 1.00 mg/dL 0.64  0.81  0.77   Sodium 135 - 145 mmol/L 138  137  134   Potassium 3.5 - 5.1 mmol/L 3.9  4.0  4.3   Chloride 98 - 111 mmol/L 105  104  101   CO2 22 - 32 mmol/L _1 Calcium 8.9 - 10.3 mg/dL 9.1  8.8  8.8   Total Protein 6.5 - 8.1 g/dL 7.4  7.3  7.6   Total Bilirubin 0.3 - 1.2 mg/dL 0.4  0.4  0.1   Alkaline Phos 38 - 126 U/L 63  67  66   AST 15 - 41 U/L _2 ALT 0 - 44 U/L _3 RADIOGRAPHIC STUDIES: I have  personally reviewed the radiological images as listed and agreed with the findings in the report. CT CHEST ABDOMEN PELVIS W CONTRAST  Result Date: 07/26/2022 CLINICAL DATA:  Metastatic lung cancer restaging, status post chemotherapy, radiation, and immunotherapy * Tracking Code: BO * EXAM: CT CHEST, ABDOMEN, AND PELVIS WITH CONTRAST TECHNIQUE: Multidetector CT imaging of the chest, abdomen and pelvis was performed following the standard protocol during bolus administration of intravenous contrast. RADIATION DOSE REDUCTION: This exam was performed according to the departmental dose-optimization program which includes automated exposure control, adjustment of the mA and/or kV according to patient size and/or use of iterative reconstruction technique. CONTRAST:  185m OMNIPAQUE IOHEXOL 300 MG/ML SOLN additional oral enteric contrast COMPARISON:  03/20/2022 FINDINGS: CT CHEST FINDINGS Cardiovascular: Aortic atherosclerosis. Normal heart size. No pericardial effusion. Mediastinum/Nodes: Unchanged matted post treatment appearance of mediastinal and right hilar lymph nodes. Unchanged epicardial lymph nodes about the left ventricular apex measuring up to 1.6 x 1.0 cm (series 2, image 54). Thyroid gland, trachea, and esophagus demonstrate no significant findings. Lungs/Pleura: Redemonstrated post treatment appearance of the chest with masslike consolidation and fibrosis of the perihilar right lung (series 3, image 73), mass of the superior segment left lower lobe, as well as extensive bilateral pleural thickening and nodularity, greater on the right,  and numerous bilateral pulmonary nodules. The majority of these nodules are unchanged, index nodule in the anterior right middle lobe measures 1.2 x 1.2 cm, unchanged (series 3, image 96), index nodule in the left upper lobe measures 1.2 x 0.7 cm, unchanged (series 3, image 88). However, there is increased nodularity about the right lung base, for example a subpleural nodule  of the peripheral right lower lobe measuring 0.8 by 0.6 cm, previously 0.5 x 0.3 cm (series 3, image 106) and an adjacent nodule measuring 0.8 x 0.8 cm, previously no greater than 0.4 cm (series 3, image 113). Musculoskeletal: No chest wall abnormality. No acute osseous findings. CT ABDOMEN PELVIS FINDINGS Hepatobiliary: No solid liver abnormality is seen. No gallstones, gallbladder wall thickening, or biliary dilatation. Pancreas: Unremarkable. No pancreatic ductal dilatation or surrounding inflammatory changes. Spleen: Normal in size without significant abnormality. Adrenals/Urinary Tract: Adrenal glands are unremarkable. Simple, benign bilateral renal cortical and parapelvic cysts, for which no further follow-up or characterization is required. Kidneys are otherwise normal, without renal calculi, solid lesion, or hydronephrosis. Bladder is unremarkable. Stomach/Bowel: Stomach is within normal limits. Appendix appears normal. No evidence of bowel wall thickening, distention, or inflammatory changes. Vascular/Lymphatic: Aortic atherosclerosis. No enlarged abdominal or pelvic lymph nodes. Reproductive: No mass or other abnormality. Other: No abdominal wall hernia or abnormality. No ascites. Musculoskeletal: No acute osseous findings. Slightly enlarged lytic osseous metastatic lesion of the base of the right femoral neck measuring 2.4 x 2.2 cm, previously 2.2 x 1.8 cm (series 4, image 65). Otherwise unchanged widespread sclerotic osseous metastatic disease throughout the axial and proximal appendicular skeleton. IMPRESSION: 1. Redemonstrated post treatment appearance of the chest with masslike consolidation and fibrosis of the perihilar right lung , mass of the superior segment left lower lobe, as well as extensive bilateral pleural thickening and nodularity, greater on the right, and numerous bilateral pulmonary nodules. 2. Increased nodularity about the right lung base, consistent with worsened pleural metastatic  disease. Other nodules unchanged. 3. Unchanged post treatment appearance of mediastinal and right hilar lymph nodes as well as epicardial lymph nodes about the left ventricular apex. 4. Slightly enlarged lytic osseous metastatic lesion of the base of the right femoral neck measuring 2.4 x 2.2 cm, previously 2.2 x 1.8 cm. This is potentially at risk for pathologic fracture given location. 5. Otherwise unchanged widespread sclerotic osseous metastatic disease throughout the axial and proximal appendicular skeleton. 6. No evidence of soft tissue metastatic disease to the abdomen or pelvis. Aortic Atherosclerosis (ICD10-I70.0). Electronically Signed   By: Delanna Ahmadi M.D.   On: 07/26/2022 11:11

## 2022-09-24 NOTE — Assessment & Plan Note (Addendum)
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   cT4 N3 M1-  KRASG12C mutation, currently on second line treatment with Lumakras [Patient has high TMB, TPS less than 1%.  Immunotherapy in combination with chemotherapy will be other options in the future if lumakras is not effective] Labs reviewed and discussed with patient. Continue Lumakras.  Obtain restaging CT at the end of Oct 2023

## 2022-09-24 NOTE — Assessment & Plan Note (Signed)
Treatment is listed above.

## 2022-10-09 ENCOUNTER — Other Ambulatory Visit: Payer: Self-pay | Admitting: Oncology

## 2022-10-09 MED ORDER — BENZONATATE 100 MG PO CAPS: ORAL_CAPSULE | ORAL | 0 refills | Status: DC

## 2022-10-09 MED ORDER — OXYCODONE HCL ER 15 MG PO T12A: 15.0000 mg | EXTENDED_RELEASE_TABLET | Freq: Two times a day (BID) | ORAL | 0 refills | Status: DC

## 2022-10-12 ENCOUNTER — Ambulatory Visit
Admission: RE | Admit: 2022-10-12 | Discharge: 2022-10-12 | Disposition: A | Discharge status: Home / Self Care | Payer: Medicaid Other | Source: Ambulatory Visit | Attending: Oncology | Admitting: Oncology

## 2022-10-12 DIAGNOSIS — C3491 Malignant neoplasm of unspecified part of right bronchus or lung: Secondary | ICD-10-CM

## 2022-10-12 MED ORDER — IOHEXOL 300 MG/ML  SOLN
100.0000 mL | Freq: Once | INTRAMUSCULAR | Status: AC | PRN
  Administered 2022-10-12: 100 mL via INTRAVENOUS

## 2022-10-15 ENCOUNTER — Encounter (INDEPENDENT_AMBULATORY_CARE_PROVIDER_SITE_OTHER): Payer: Self-pay

## 2022-10-24 ENCOUNTER — Other Ambulatory Visit: Payer: Self-pay | Admitting: Oncology

## 2022-10-24 DIAGNOSIS — C3490 Malignant neoplasm of unspecified part of unspecified bronchus or lung: Secondary | ICD-10-CM

## 2022-11-01 ENCOUNTER — Encounter: Payer: Self-pay | Admitting: Oncology

## 2022-11-01 ENCOUNTER — Inpatient Hospital Stay: Payer: Medicaid Other | Attending: Radiation Oncology

## 2022-11-01 ENCOUNTER — Inpatient Hospital Stay: Payer: Medicaid Other

## 2022-11-01 ENCOUNTER — Inpatient Hospital Stay (HOSPITAL_BASED_OUTPATIENT_CLINIC_OR_DEPARTMENT_OTHER): Payer: Medicaid Other | Admitting: Oncology

## 2022-11-01 VITALS — BP 118/66 | HR 114 | Temp 98.2°F | Wt 148.1 lb

## 2022-11-01 DIAGNOSIS — Z87891 Personal history of nicotine dependence: Secondary | ICD-10-CM | POA: Diagnosis not present

## 2022-11-01 DIAGNOSIS — Z5111 Encounter for antineoplastic chemotherapy: Secondary | ICD-10-CM | POA: Diagnosis not present

## 2022-11-01 DIAGNOSIS — Z79899 Other long term (current) drug therapy: Secondary | ICD-10-CM | POA: Diagnosis not present

## 2022-11-01 DIAGNOSIS — R634 Abnormal weight loss: Secondary | ICD-10-CM | POA: Diagnosis not present

## 2022-11-01 DIAGNOSIS — C3490 Malignant neoplasm of unspecified part of unspecified bronchus or lung: Secondary | ICD-10-CM | POA: Diagnosis not present

## 2022-11-01 DIAGNOSIS — G893 Neoplasm related pain (acute) (chronic): Secondary | ICD-10-CM | POA: Insufficient documentation

## 2022-11-01 DIAGNOSIS — C7951 Secondary malignant neoplasm of bone: Secondary | ICD-10-CM | POA: Insufficient documentation

## 2022-11-01 DIAGNOSIS — J449 Chronic obstructive pulmonary disease, unspecified: Secondary | ICD-10-CM | POA: Insufficient documentation

## 2022-11-01 DIAGNOSIS — G4700 Insomnia, unspecified: Secondary | ICD-10-CM | POA: Diagnosis not present

## 2022-11-01 DIAGNOSIS — Z923 Personal history of irradiation: Secondary | ICD-10-CM | POA: Insufficient documentation

## 2022-11-01 DIAGNOSIS — Z23 Encounter for immunization: Secondary | ICD-10-CM | POA: Insufficient documentation

## 2022-11-01 DIAGNOSIS — C782 Secondary malignant neoplasm of pleura: Secondary | ICD-10-CM | POA: Insufficient documentation

## 2022-11-01 DIAGNOSIS — C3411 Malignant neoplasm of upper lobe, right bronchus or lung: Secondary | ICD-10-CM | POA: Insufficient documentation

## 2022-11-01 DIAGNOSIS — C3491 Malignant neoplasm of unspecified part of right bronchus or lung: Secondary | ICD-10-CM

## 2022-11-01 DIAGNOSIS — J910 Malignant pleural effusion: Secondary | ICD-10-CM | POA: Diagnosis not present

## 2022-11-01 LAB — CBC WITH DIFFERENTIAL/PLATELET
Abs Immature Granulocytes: 0.01 10*3/uL (ref 0.00–0.07)
Basophils Absolute: 0 10*3/uL (ref 0.0–0.1)
Basophils Relative: 0 %
Eosinophils Absolute: 0 10*3/uL (ref 0.0–0.5)
Eosinophils Relative: 1 %
HCT: 36.9 % (ref 36.0–46.0)
Hemoglobin: 11.7 g/dL — ABNORMAL LOW (ref 12.0–15.0)
Immature Granulocytes: 0 %
Lymphocytes Relative: 13 %
Lymphs Abs: 0.6 10*3/uL — ABNORMAL LOW (ref 0.7–4.0)
MCH: 27.3 pg (ref 26.0–34.0)
MCHC: 31.7 g/dL (ref 30.0–36.0)
MCV: 86 fL (ref 80.0–100.0)
Monocytes Absolute: 0.5 10*3/uL (ref 0.1–1.0)
Monocytes Relative: 12 %
Neutro Abs: 3.3 10*3/uL (ref 1.7–7.7)
Neutrophils Relative %: 74 %
Platelets: 213 10*3/uL (ref 150–400)
RBC: 4.29 MIL/uL (ref 3.87–5.11)
RDW: 14.3 % (ref 11.5–15.5)
WBC: 4.4 10*3/uL (ref 4.0–10.5)
nRBC: 0 % (ref 0.0–0.2)

## 2022-11-01 LAB — COMPREHENSIVE METABOLIC PANEL
ALT: 11 U/L (ref 0–44)
AST: 16 U/L (ref 15–41)
Albumin: 3.4 g/dL — ABNORMAL LOW (ref 3.5–5.0)
Alkaline Phosphatase: 62 U/L (ref 38–126)
Anion gap: 9 (ref 5–15)
BUN: 16 mg/dL (ref 8–23)
CO2: 27 mmol/L (ref 22–32)
Calcium: 9.3 mg/dL (ref 8.9–10.3)
Chloride: 99 mmol/L (ref 98–111)
Creatinine, Ser: 0.79 mg/dL (ref 0.44–1.00)
GFR, Estimated: >60 mL/min (ref >60)
Glucose, Bld: 92 mg/dL (ref 70–99)
Potassium: 4.2 mmol/L (ref 3.5–5.1)
Sodium: 135 mmol/L (ref 135–145)
Total Bilirubin: 0.4 mg/dL (ref 0.3–1.2)
Total Protein: 8 g/dL (ref 6.5–8.1)

## 2022-11-01 MED ORDER — ALBUTEROL SULFATE HFA 108 (90 BASE) MCG/ACT IN AERS: 2.0000 | INHALATION_SPRAY | Freq: Four times a day (QID) | RESPIRATORY_TRACT | 2 refills | Status: DC | PRN

## 2022-11-01 MED ORDER — OXYCODONE HCL 5 MG PO TABS: 5.0000 mg | ORAL_TABLET | Freq: Four times a day (QID) | ORAL | 0 refills | Status: DC | PRN

## 2022-11-01 MED ORDER — IPRATROPIUM-ALBUTEROL 0.5-2.5 (3) MG/3ML IN SOLN: 3.0000 mL | Freq: Three times a day (TID) | RESPIRATORY_TRACT | 1 refills | Status: DC

## 2022-11-01 MED ORDER — INFLUENZA VAC SPLIT QUAD 0.5 ML IM SUSY
0.5000 mL | PREFILLED_SYRINGE | Freq: Once | INTRAMUSCULAR | Status: AC
  Administered 2022-11-01: 0.5 mL via INTRAMUSCULAR
  Filled 2022-11-01: qty 0.5

## 2022-11-01 NOTE — Assessment & Plan Note (Signed)
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   cT4 N3 M1-  KRASG12C mutation, currently on second line treatment with Lumakras [Patient has high TMB, TPS less than 1%.  Immunotherapy in combination with chemotherapy will be other options in the future if lumakras is not effective] Labs reviewed and discussed with patient. Continue Lumakras.  Oct 2023 CT showed stable disease. Reviewed with patient.

## 2022-11-01 NOTE — Progress Notes (Signed)
Patient is here for follow-up. She states that she has been having pain in her right leg that is more frequent now. She also states that she is more SOB with exertion now.

## 2022-11-01 NOTE — Assessment & Plan Note (Signed)
Treatment is listed above.

## 2022-11-01 NOTE — Progress Notes (Signed)
Hematology/Oncology Progress note [phone] [phone]     Patient Care Team: Pcp, No as PCP - General Telford Nab, RN as Oncology Nurse Navigator [name], MD as Medical Oncologist (Oncology)   Name of the patient: Brittney Fisher  [id number]  [birth year]   ASSESSMENT & PLAN:   Cancer Staging  Primary malignant neoplasm of lung metastatic to other site [facility]) Staging form: Lung, AJCC 8th Edition - Clinical stage from 11/04/2020: Stage IV (cT4, cN3, cM1) - Signed by [name], MD on 11/04/2020   Primary malignant neoplasm of lung metastatic to other site [facility]) #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   cT4 N3 M1-  KRASG12C mutation, currently on second line treatment with Lumakras [Patient has high TMB, TPS less than 1%.  Immunotherapy in combination with chemotherapy will be other options in the future if lumakras is not effective] Labs reviewed and discussed with patient. Continue Lumakras.  Oct 2023 CT showed stable disease. Reviewed with patient.    Encounter for antineoplastic chemotherapy Treatment is listed above.  Neoplasm related pain Right thigh pain, lower back pain has improved.  status post palliative radiation. Encourage patient to use OxyContin twice daily, and oxycodone 83m PRN as directed.   Bone metastasis patient declines bisphosphonate. Recent CT scan showed increased right femur lesion, at risk of pathological fracture.  She is not interested in stabilizing surgery.   Orders Placed This Encounter  Procedures   CBC with Differential/Platelet    Standing Status:   Future    Standing Expiration Date:   11/01/2023   Comprehensive metabolic panel    Standing Status:   Future    Standing Expiration Date:   11/01/2023    Follow up in 4 weeks.  All questions were answered. The patient knows to call the clinic with any problems, questions or concerns.  ZEarlie Server MD, PhD [facility]  Oncology 11/01/2022    PERTINENT ONCOLOGY HISTORY 61y.o. female with past medical history including GERD, COPD, current everyday smoker presents for follow-up of lung mass and pleural effusion. 10/20/2020, patient was brought to ED via EMS due to generalized weakness, dizziness, chest pain shortness of breath.  She was recently diagnosed with COPD approximately 1 month ago by primary care provider.  Also unintentional weight loss during the last few months. Image work-up showed right-sided pleural effusion.  She underwent right thoracentesis.  With removal of 2.4 L of fluid. 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  Metastatic disease to low cervical/thoracic nodes, lung, right pleural space and bones.  Moderate to large right pleural effusion.  SVC narrowing. 10/21/2020 MRI brain is negative for metastasis.  Mild chronic microvascular ischemic changes in the white matter.  Negative for acute infarct Regarding to the SVC narrowing, patient was seen by vascular surgeon and was recommended no intervention inpatient.  Patient to follow-up outpatient with vascular surgeon for evaluation. Patient also was treated for COPD exacerbation with hypoxia on exertion. Qualifies for home oxygen and hospitalist arrange home health and a nebulizer.  Patient was given a course of prednisone taper and empiric Levaquin. Patient was discharged and present today to follow-up with cytology results and further management plan  10/27/2020 PET scan showed right upper lobe primary bronchogenic mass with direct invasion into mediastinum. Metastatic disease to the right pleural space, lung, bone, nodes of the chest and less so lower leg/upper abdomen. Hypermetabolic him along the course of the right  axillary vein with concurrent subtle hyper attenuation within the axillary vein and SVC.  This continues to the level of SVC narrowing.   Suspicious for SVC occlusion and developing thrombus. Moderate right pleural effusion and small pericardial effusion.  # # left supraclavicular mass biopsy- pathology is positive for adenocarcinoma. positive for CK7, with patchy, weak CK20.  They are  negative for TTF1, NapsinA, GATA3, CDX2, and Pax8.  The findings are  nonspecific; but may be compatible with a poorly differentiated  adenocarcinoma of lung origin, especially given the imaging findings  She also had another repeat thoracentesis and fluid cytology showed malignancy.  #NGS showed AXIN 1 [id number], DIS2 M167f, KRAS G12C, PRDM1 M1 [id number], [id number] MS stable, TPS <1%   11/14/2020- 12/12/2020.  #Bronchial obstruction and SVC occlusion.  s/p palliative radiation #After 2 cycles of chemtherapy, patient had CT scan done due to shortness of breath.  01/05/2021, CT chest angiogram PE protocol showed no evidence of pulmonary embolism.  Stable disease.  Right upper lobe mass about 6.7 x 5.3 cm.  Widespread metastatic disease, lung and bone involvement. Interval development of right-sided hydronephrosis. 03/06/2021 finished T2 palliative radiation 03/22/2021, 6 cycles of carboplatin/Alimta/bevacizumab.  Infusion reaction of carboplatin during cycle 6.  Carboplatin discontinued. 04/07/2021 CT chest abdomen pelvis-partial response Mild decrease in size of right lung mass, mildly decreased mediastinal and hilar adenopathy.  Extensive right-sided pleural metastasis unchanged.  Similar appearance of diffuse bilateral pulmonary nodules.  Multifocal lytic sclerotic bone metastasis.  New superior endplate deformity of T6 04/12/2021 - 09/08/2021, patient was continued on bevacizumab and Alimta.  09/21/2021, bone scan showed multifocal abnormal uptake including mid to distal shaft of right femur, left femoral neck and trochanter, left sacroiliac region, left ischium.  Focal activity at the proximal tibia on the left.  Multi focal heterogeneous bilateral  rib activity.  Focal activity at the sternum. 09/27/2021, CT chest abdomen pelvis with contrast showed progression of infrahilar left lower lobe with a concerning clearly progressive masslike consolidation opacity.  Mild progression of small left pleural effusion.  Similar experience of extensive irregular right-sided pleural disease.  Similar experience of bony metastasis.  09/29/2021 extra cycle of maintenance bevacizumab and Alimta while waiting for approval of Lumakras  10/20/2021 started on Lumakras  01/09/2022, CT chest abdomen pelvis with contrast showed no substantial interval changes in exam.  Stable disease. 03/20/2022, CT chest abdomen pelvis with contrast showed unchanged circumferential pleural thickening/pleural nodularity of the right hemithorax, left infrahilar mass and bilateral pulmonary nodules.  Stable right perihilar consolidation likely postradiation changes.  Unchanged numerous sclerotic osseous lesions.  Aortic atherosclerosis.  10/12/22 CT chest abdomen pelvis w contrast 1. No significant change in post treatment appearance of the chest.Perihilar post radiation fibrosis and consolidation of the right lung, mass of the superior segment left lower lobe, extensive right-greater-than-left pleural thickening and nodularity, pleural effusions, and numerous bilateral pulmonary nodules are unchanged. 2. Unchanged matted post treatment appearance of soft tissue throughout the mediastinum and right hilum. Unchanged enlarged left epicardial lymph nodes without other discretely visualized lymph nodes in the chest. 3. Similar appearance of a lytic osseous metastatic lesion of the base of the right femoral neck, with additional unchanged widespread  sclerotic osseous metastatic disease throughout the included axial and proximal appendicular skeleton. 4. No evidence of new metastatic disease nor soft tissue metastatic disease in the abdomen or pelvis.     INTERVAL HISTORY Brittney Fisher  a 61y.o. female who has above history reviewed by me today presents  for follow up visit for management of metastatic lung cancer, acute visit for pain.  She takes Tylenol PRN She reports increased right thigh pain. She takes Oxycontin only in AM, oxycodone with tylenol PRN Chronic SOB with exertion,worse, she would like to get inhaler refilled.     Review of Systems  Constitutional:  Negative for appetite change, chills, fatigue and fever.  HENT:   Negative for hearing loss and voice change.   Eyes:  Negative for eye problems.  Respiratory:  Positive for shortness of breath. Negative for chest tightness and cough.   Cardiovascular:  Negative for chest pain.  Gastrointestinal:  Negative for abdominal distention, abdominal pain and blood in stool.  Endocrine: Negative for hot flashes.  Genitourinary:  Positive for nocturia. Negative for difficulty urinating and frequency.   Musculoskeletal:  Negative for arthralgias.       Right thigh pain  Skin:  Negative for itching and rash.  Neurological:  Negative for extremity weakness.  Hematological:  Negative for adenopathy.  Psychiatric/Behavioral:  Positive for sleep disturbance. Negative for confusion.       Allergies  Allergen Reactions   Carboplatin Shortness Of Breath, Itching and Cough     Past Medical History:  Diagnosis Date   COPD (chronic obstructive pulmonary disease) (HCC)    GERD (gastroesophageal reflux disease)    Metastatic lung cancer (metastasis from lung to other site) ([place]) 11/02/2020     Past Surgical History:  Procedure Laterality Date   COLONOSCOPY WITH ESOPHAGOGASTRODUODENOSCOPY (EGD)     COLONOSCOPY WITH PROPOFOL N/A 11/14/2015   Procedure: COLONOSCOPY WITH PROPOFOL;  Surgeon: Hulen Luster, MD;  Location: [facility] ENDOSCOPY;  Service: Gastroenterology;  Laterality: N/A;   ESOPHAGOGASTRODUODENOSCOPY (EGD) WITH PROPOFOL N/A 11/14/2015   Procedure: ESOPHAGOGASTRODUODENOSCOPY (EGD) WITH PROPOFOL;  Surgeon: Hulen Luster,  MD;  Location[facility] ENDOSCOPY;  Service: Gastroenterology;  Laterality: N/A;    Social History   Socioeconomic History   Marital status: Widowed    Spouse name: Not on file   Number of children: Not on file   Years of education: Not on file   Highest education level: Not on file  Occupational History   Not on file  Tobacco Use   Smoking status: Former    Types: Cigarettes    Quit date: 09/2021    Years since quitting: 1.1   Smokeless tobacco: Never  Vaping Use   Vaping Use: Never used  Substance and Sexual Activity   Alcohol use: Never   Drug use: Never   Sexual activity: Not Currently  Other Topics Concern   Not on file  Social History Narrative   Not on file   Social Determinants of Health   Financial Resource Strain: Not on file  Food Insecurity: Not on file  Transportation Needs: Not on file  Physical Activity: Not on file  Stress: Not on file  Social Connections: Not on file  Intimate Partner Violence: Not on file    Family History  Problem Relation Age of Onset   Breast cancer Cousin 58   Diabetes type II Mother    Hypertension Mother    Colon cancer Father    Hypertension Father      Current Outpatient Medications:    acetaminophen (TYLENOL) 500 MG tablet, Take 1,000 mg by mouth every 8 (eight) hours as needed for moderate pain. , Disp: , Rfl:    albuterol (VENTOLIN HFA) 108 (90 Base) MCG/ACT inhaler, Inhale 2 puffs into the lungs every 6 (six) hours as needed  for wheezing or shortness of breath., Disp: 8 g, Rfl: 2   ALPRAZolam (XANAX) 0.5 MG tablet, Take 1 tablet (0.5 mg total) by mouth See admin instructions. Take 1 tablet 60 minutes before procedure; if needed due to incomplete response and/or duration of procedure, may repeat another 1 tablet after 30 minutes., Disp: 2 tablet, Rfl: 0   benzonatate (TESSALON) 100 MG capsule, Take 1 capsule by mouth three times daily as needed for cough, Disp: 60 capsule, Rfl: 0   calcium carbonate (TUMS - DOSED IN MG  ELEMENTAL CALCIUM) 500 MG chewable tablet, Chew 1 tablet by mouth as needed., Disp: , Rfl:    folic acid (FOLVITE) 1 MG tablet, Take 1 tablet (1 mg total) by mouth daily. Start 5-7 days before Alimta chemotherapy. Continue until 21 days after Alimta completed., Disp: 100 tablet, Rfl: 3   guaiFENesin (MUCINEX) 600 MG 12 hr tablet, Take 1 tablet (600 mg total) by mouth 2 (two) times daily., Disp: 14 tablet, Rfl: 0   ibuprofen (ADVIL) 800 MG tablet, Take 1 tablet (800 mg total) by mouth daily., Disp: 30 tablet, Rfl: 0   LUMAKRAS 120 MG tablet, TAKE 8 TABLETS BY MOUTH DAILY, Disp: 240 tablet, Rfl: 2   oxyCODONE (OXYCONTIN) 15 mg 12 hr tablet, Take 1 tablet (15 mg total) by mouth every 12 (twelve) hours., Disp: 60 tablet, Rfl: 0   phenazopyridine (PYRIDIUM) 95 MG tablet, Take 2 tablets (190 mg total) by mouth 3 (three) times daily as needed for pain., Disp: 6 tablet, Rfl: 0   protein supplement shake (PREMIER PROTEIN) LIQD, Take 2 oz by mouth 4 (four) times daily., Disp: , Rfl:    dexamethasone (DECADRON) 4 MG tablet, Take 1 tablet (4 mg total) by mouth daily as needed. (Patient not taking: Reported on 11/01/2022), Disp: 10 tablet, Rfl: 0   gabapentin (NEURONTIN) 100 MG capsule, Take 1 capsule (100 mg total) by mouth 3 (three) times daily. (Patient not taking: Reported on 06/21/2022), Disp: 90 capsule, Rfl: 0   ipratropium-albuterol (DUONEB) 0.5-2.5 (3) MG/3ML SOLN, Take 3 mLs by nebulization 3 (three) times daily., Disp: 360 mL, Rfl: 1   magic mouthwash w/lidocaine SOLN, Take 5 mLs by mouth 4 (four) times daily as needed for mouth pain. Sig: Swish/Swallow 5-10 ml four times a day as needed. Dispense 480 ml. 1RF (Patient not taking: Reported on 06/21/2022), Disp: 480 mL, Rfl: 1   ondansetron (ZOFRAN) 8 MG tablet, Take 1 tablet (8 mg total) by mouth 2 (two) times daily as needed for refractory nausea / vomiting. Start on day 3 after chemo. (Patient not taking: Reported on 06/21/2022), Disp: 30 tablet, Rfl: 1    oxyCODONE (OXY IR/ROXICODONE) 5 MG immediate release tablet, Take 1 tablet (5 mg total) by mouth every 6 (six) hours as needed for moderate pain or severe pain., Disp: 120 tablet, Rfl: 0   Polyethylene Glycol 3350 (MIRALAX PO), Take by mouth daily as needed. (Patient not taking: Reported on 08/23/2022), Disp: , Rfl:    prochlorperazine (COMPAZINE) 10 MG tablet, Take 1 tablet (10 mg total) by mouth every 6 (six) hours as needed (Nausea or vomiting). (Patient not taking: Reported on 06/21/2022), Disp: 30 tablet, Rfl: 1  Physical exam: ECOG 1 Vitals:   11/01/22 1341  BP: 118/66  Pulse: (!) 114  Temp: 98.2 F (36.8 C)  TempSrc: Tympanic  SpO2: 98%  Weight: 148 lb 1.6 oz (67.2 kg)   Physical Exam Constitutional:      General: She is not in acute  distress.    Comments: Thin built female walks independently  HENT:     Head: Normocephalic and atraumatic.  Eyes:     General: No scleral icterus. Cardiovascular:     Rate and Rhythm: Regular rhythm. Tachycardia present.     Heart sounds: Normal heart sounds.  Pulmonary:     Effort: Pulmonary effort is normal. No respiratory distress.     Breath sounds: No wheezing.     Comments: Slightly decreased breath sound bilaterally.  Abdominal:     General: Bowel sounds are normal. There is no distension.     Palpations: Abdomen is soft.  Musculoskeletal:        General: No deformity. Normal range of motion.     Cervical back: Normal range of motion and neck supple.  Skin:    General: Skin is warm and dry.     Findings: No erythema or rash.  Neurological:     Mental Status: She is alert and oriented to person, place, and time. Mental status is at baseline.     Cranial Nerves: No cranial nerve deficit.     Coordination: Coordination normal.  Psychiatric:        Mood and Affect: Mood normal.    LABORATORY STUDIES     Latest Ref Rng & Units 11/01/2022    1:30 PM 09/24/2022    1:50 PM 08/23/2022   10:46 AM  CBC  WBC 4.0 - 10.5 K/uL 4.4  4.1   4.0   Hemoglobin 12.0 - 15.0 g/dL 11.7  12.0  12.4   Hematocrit 36.0 - 46.0 % 36.9  37.5  38.9   Platelets 150 - 400 K/uL 213  190  175       Latest Ref Rng & Units 11/01/2022    1:30 PM 09/24/2022    1:50 PM 08/23/2022   10:46 AM  CMP  Glucose 70 - 99 mg/dL 92  111  82   BUN 8 - 23 mg/dL _0 Creatinine 0.44 - 1.00 mg/dL 0.79  0.64  0.81   Sodium 135 - 145 mmol/L 135  138  137   Potassium 3.5 - 5.1 mmol/L 4.2  3.9  4.0   Chloride 98 - 111 mmol/L 99  105  104   CO2 22 - 32 mmol/L _1 Calcium 8.9 - 10.3 mg/dL 9.3  9.1  8.8   Total Protein 6.5 - 8.1 g/dL 8.0  7.4  7.3   Total Bilirubin 0.3 - 1.2 mg/dL 0.4  0.4  0.4   Alkaline Phos 38 - 126 U/L 62  63  67   AST 15 - 41 U/L _2 ALT 0 - 44 U/L _3 RADIOGRAPHIC STUDIES: I have personally reviewed the radiological images as listed and agreed with the findings in the report. CT CHEST ABDOMEN PELVIS W CONTRAST  Result Date: 10/15/2022 CLINICAL DATA:  Metastatic lung cancer restaging, ongoing chemotherapy * Tracking Code: BO * EXAM: CT CHEST, ABDOMEN, AND PELVIS WITH CONTRAST TECHNIQUE: Multidetector CT imaging of the chest, abdomen and pelvis was performed following the standard protocol during bolus administration of intravenous contrast. RADIATION DOSE REDUCTION: This exam was performed according to the departmental dose-optimization program which includes automated exposure control, adjustment of the mA and/or kV according to patient size and/or use of iterative reconstruction technique. CONTRAST:  164m OMNIPAQUE IOHEXOL 300 MG/ML  SOLN COMPARISON:  07/25/2022 FINDINGS: CT CHEST FINDINGS Cardiovascular: Aortic atherosclerosis. Normal heart size. No pericardial effusion. Mediastinum/Nodes: Unchanged matted post treatment appearance of soft tissue throughout the mediastinum and right hilum. Unchanged enlarged left epicardial lymph nodes measuring up to 1.7 x 1.1 cm (series 2, image 50), without other  discretely visualized lymph nodes. Thyroid gland, trachea, and esophagus demonstrate no significant findings. Lungs/Pleura: No significant change in post treatment appearance of the chest. Perihilar post radiation fibrosis and consolidation of the right lung, mass of the superior segment left lower lobe measuring approximately 3.4 x 3.3 cm (series 4, image 86), as well as extensive right-greater-than-left pleural thickening and nodularity as well as small bilateral pleural effusions are all unchanged. Numerous bilateral pulmonary nodules likewise unchanged. Musculoskeletal: No chest wall abnormality. No acute osseous findings. CT ABDOMEN PELVIS FINDINGS Hepatobiliary: No solid liver abnormality is seen. No gallstones, gallbladder wall thickening, or biliary dilatation. Pancreas: Unremarkable. No pancreatic ductal dilatation or surrounding inflammatory changes. Spleen: Normal in size without significant abnormality. Adrenals/Urinary Tract: Adrenal glands are unremarkable. Simple, benign bilateral renal cortical and parapelvic cysts, for which no further follow-up or characterization is required. Kidneys are otherwise normal, without renal calculi, solid lesion, or hydronephrosis. Bladder is unremarkable. Stomach/Bowel: Stomach is within normal limits. Appendix appears normal. No evidence of bowel wall thickening, distention, or inflammatory changes. Vascular/Lymphatic: Aortic atherosclerosis. No enlarged abdominal or pelvic lymph nodes. Reproductive: No mass or other abnormality. Other: No abdominal wall hernia or abnormality. No ascites. Musculoskeletal: No acute osseous findings. Similar appearance of a lytic osseous metastatic lesion of the base of the right femoral neck (series 5, image 73), with additional unchanged widespread sclerotic osseous metastatic disease throughout the included axial and proximal appendicular skeleton. IMPRESSION: 1. No significant change in post treatment appearance of the chest.  Perihilar post radiation fibrosis and consolidation of the right lung, mass of the superior segment left lower lobe, extensive right-greater-than-left pleural thickening and nodularity, pleural effusions, and numerous bilateral pulmonary nodules are unchanged. 2. Unchanged matted post treatment appearance of soft tissue throughout the mediastinum and right hilum. Unchanged enlarged left epicardial lymph nodes without other discretely visualized lymph nodes in the chest. 3. Similar appearance of a lytic osseous metastatic lesion of the base of the right femoral neck, with additional unchanged widespread sclerotic osseous metastatic disease throughout the included axial and proximal appendicular skeleton. 4. No evidence of new metastatic disease nor soft tissue metastatic disease in the abdomen or pelvis. Aortic Atherosclerosis (ICD10-I70.0). Electronically Signed   By: Delanna Ahmadi M.D.   On: 10/15/2022 11:25

## 2022-11-01 NOTE — Assessment & Plan Note (Signed)
Right thigh pain, lower back pain has improved.  status post palliative radiation. Encourage patient to use OxyContin twice daily, and oxycodone 5mg  PRN as directed.

## 2022-11-01 NOTE — Assessment & Plan Note (Signed)
patient declines bisphosphonate. Recent CT scan showed increased right femur lesion, at risk of pathological fracture.  She is not interested in stabilizing surgery.

## 2022-11-15 ENCOUNTER — Telehealth: Payer: Self-pay

## 2022-11-15 NOTE — Telephone Encounter (Signed)
Niece called triage line asking to explain pain medications dosage again due to confusion and states patient is taking 6000mg  of Tylenol every 4 hr. Explained to her patient can take Oxycotin twice daily every 12 hr (2 tab) and oxy every 6hr(4Tab) as this is ordered this way. There are concerns of dependence. She should also only be taking Tylenol every 6-8hr not every 4hr. Encouraged heat/cold applications to assist with pain in between. Niece verbalizes understanding and states she will call back if they options aren't working. No other concerns voiced.

## 2022-12-05 ENCOUNTER — Inpatient Hospital Stay: Payer: Medicaid Other

## 2022-12-05 ENCOUNTER — Telehealth: Payer: Self-pay | Admitting: *Deleted

## 2022-12-05 ENCOUNTER — Inpatient Hospital Stay: Payer: Medicaid Other | Attending: Radiation Oncology | Admitting: Oncology

## 2022-12-05 ENCOUNTER — Other Ambulatory Visit: Payer: Self-pay

## 2022-12-05 ENCOUNTER — Encounter: Payer: Self-pay | Admitting: Oncology

## 2022-12-05 VITALS — BP 118/76 | HR 137 | Temp 98.9°F | Resp 20 | Wt 136.2 lb

## 2022-12-05 VITALS — HR 114

## 2022-12-05 DIAGNOSIS — G893 Neoplasm related pain (acute) (chronic): Secondary | ICD-10-CM | POA: Insufficient documentation

## 2022-12-05 DIAGNOSIS — J449 Chronic obstructive pulmonary disease, unspecified: Secondary | ICD-10-CM | POA: Diagnosis not present

## 2022-12-05 DIAGNOSIS — Z7952 Long term (current) use of systemic steroids: Secondary | ICD-10-CM | POA: Diagnosis not present

## 2022-12-05 DIAGNOSIS — C7951 Secondary malignant neoplasm of bone: Secondary | ICD-10-CM | POA: Diagnosis present

## 2022-12-05 DIAGNOSIS — R531 Weakness: Secondary | ICD-10-CM

## 2022-12-05 DIAGNOSIS — Z87891 Personal history of nicotine dependence: Secondary | ICD-10-CM | POA: Diagnosis not present

## 2022-12-05 DIAGNOSIS — Z79899 Other long term (current) drug therapy: Secondary | ICD-10-CM | POA: Insufficient documentation

## 2022-12-05 DIAGNOSIS — Z923 Personal history of irradiation: Secondary | ICD-10-CM | POA: Diagnosis not present

## 2022-12-05 DIAGNOSIS — R634 Abnormal weight loss: Secondary | ICD-10-CM | POA: Diagnosis not present

## 2022-12-05 DIAGNOSIS — C3411 Malignant neoplasm of upper lobe, right bronchus or lung: Secondary | ICD-10-CM | POA: Insufficient documentation

## 2022-12-05 DIAGNOSIS — C782 Secondary malignant neoplasm of pleura: Secondary | ICD-10-CM | POA: Insufficient documentation

## 2022-12-05 DIAGNOSIS — J910 Malignant pleural effusion: Secondary | ICD-10-CM | POA: Insufficient documentation

## 2022-12-05 DIAGNOSIS — G4700 Insomnia, unspecified: Secondary | ICD-10-CM | POA: Insufficient documentation

## 2022-12-05 LAB — CBC WITH DIFFERENTIAL/PLATELET
Abs Immature Granulocytes: 0.02 10*3/uL (ref 0.00–0.07)
Basophils Absolute: 0 10*3/uL (ref 0.0–0.1)
Basophils Relative: 0 %
Eosinophils Absolute: 0 10*3/uL (ref 0.0–0.5)
Eosinophils Relative: 0 %
HCT: 37.7 % (ref 36.0–46.0)
Hemoglobin: 12.1 g/dL (ref 12.0–15.0)
Immature Granulocytes: 0 %
Lymphocytes Relative: 10 %
Lymphs Abs: 0.6 10*3/uL — ABNORMAL LOW (ref 0.7–4.0)
MCH: 27.1 pg (ref 26.0–34.0)
MCHC: 32.1 g/dL (ref 30.0–36.0)
MCV: 84.3 fL (ref 80.0–100.0)
Monocytes Absolute: 0.6 10*3/uL (ref 0.1–1.0)
Monocytes Relative: 10 %
Neutro Abs: 5 10*3/uL (ref 1.7–7.7)
Neutrophils Relative %: 80 %
Platelets: 299 10*3/uL (ref 150–400)
RBC: 4.47 MIL/uL (ref 3.87–5.11)
RDW: 14.7 % (ref 11.5–15.5)
WBC: 6.3 10*3/uL (ref 4.0–10.5)
nRBC: 0 % (ref 0.0–0.2)

## 2022-12-05 LAB — COMPREHENSIVE METABOLIC PANEL
ALT: 11 U/L (ref 0–44)
AST: 17 U/L (ref 15–41)
Albumin: 3.6 g/dL (ref 3.5–5.0)
Alkaline Phosphatase: 63 U/L (ref 38–126)
Anion gap: 12 (ref 5–15)
BUN: 17 mg/dL (ref 8–23)
CO2: 25 mmol/L (ref 22–32)
Calcium: 9.1 mg/dL (ref 8.9–10.3)
Chloride: 100 mmol/L (ref 98–111)
Creatinine, Ser: 0.73 mg/dL (ref 0.44–1.00)
GFR, Estimated: >60 mL/min (ref >60)
Glucose, Bld: 102 mg/dL — ABNORMAL HIGH (ref 70–99)
Potassium: 3.9 mmol/L (ref 3.5–5.1)
Sodium: 137 mmol/L (ref 135–145)
Total Bilirubin: 0.5 mg/dL (ref 0.3–1.2)
Total Protein: 8.6 g/dL — ABNORMAL HIGH (ref 6.5–8.1)

## 2022-12-05 MED ORDER — NALOXONE HCL 4 MG/0.1ML NA LIQD: NASAL | 0 refills | Status: DC

## 2022-12-05 MED ORDER — DEXAMETHASONE SODIUM PHOSPHATE 10 MG/ML IJ SOLN: 10.0000 mg | Freq: Once | INTRAMUSCULAR | Status: DC

## 2022-12-05 MED ORDER — OXYCODONE HCL ER 20 MG PO T12A: 20.0000 mg | EXTENDED_RELEASE_TABLET | Freq: Two times a day (BID) | ORAL | 0 refills | Status: DC

## 2022-12-05 MED ORDER — SODIUM CHLORIDE 0.9 % IV SOLN
10.0000 mg | Freq: Once | INTRAVENOUS | Status: AC
  Administered 2022-12-05: 10 mg via INTRAVENOUS
  Filled 2022-12-05: qty 10

## 2022-12-05 MED ORDER — MORPHINE SULFATE (PF) 2 MG/ML IV SOLN
2.0000 mg | Freq: Once | INTRAVENOUS | Status: AC
  Administered 2022-12-05: 2 mg via INTRAVENOUS
  Filled 2022-12-05: qty 1

## 2022-12-05 MED ORDER — DEXAMETHASONE 4 MG PO TABS: 4.0000 mg | ORAL_TABLET | Freq: Every day | ORAL | 0 refills | Status: DC

## 2022-12-05 MED ORDER — OXYCODONE HCL 5 MG PO TABS: 5.0000 mg | ORAL_TABLET | ORAL | 0 refills | Status: DC | PRN

## 2022-12-05 MED ORDER — SODIUM CHLORIDE 0.9 % IV SOLN
INTRAVENOUS | Status: DC
  Filled 2022-12-05 (×2): qty 250

## 2022-12-05 NOTE — Progress Notes (Signed)
IV pain med, dex given. Pt reports "feeling better". Per Rulon Abide, NP patient may be discharged without receiving entire liter of NS.

## 2022-12-05 NOTE — Telephone Encounter (Signed)
Patient niece [name] called [facility] appointment with [facility] today due to patient worsening low back pain radiating down her right leg. She reports that patient was crying with the pain last night. The current medicine is not working Oxy ER and Oxy IR and Tylenol. Please advise

## 2022-12-05 NOTE — Progress Notes (Unsigned)
Increasing right leg pain with occasional right side back pain.  Reports not pain in clinic today but home pain meds does not keep pain manageable at home.  New difficulty breathing on exertion.   New restlessness for past couple of weeks and is very restless during check in.  Constipation with last complete bowel movement about 2 weeks ago.    Decrease in appetite with 12 lb wt loss in last month.

## 2022-12-05 NOTE — Progress Notes (Signed)
   Symptom Management Consult note [facility]  [phone] [phone]  Patient Care Team: Pcp, No as PCP - General Rhode, Hayley, RN as Oncology Nurse Navigator Yu, Zhou, MD as Medical Oncologist (Oncology)   Name of the patient: Brittney Fisher  [id number]  03/25/1961   Date of visit: 12/05/2022   Diagnosis- Lung Cancer   Chief complaint/ Reason for visit- Low back pain   Heme/Onc history: 61 y.o. female with past medical history including GERD, COPD, current everyday smoker presents for follow-up of lung mass and pleural effusion. 10/20/2020, patient was brought to ED via EMS due to generalized weakness, dizziness, chest pain shortness of breath.  She was recently diagnosed with COPD approximately 1 month ago by primary care provider.  Also unintentional weight loss during the last few months. Image work-up showed right-sided pleural effusion.  She underwent right thoracentesis.  With removal of 2.4 L of fluid. 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  Metastatic disease to low cervical/thoracic nodes, lung, right pleural space and bones.  Moderate to large right pleural effusion.  SVC narrowing. 10/21/2020 MRI brain is negative for metastasis.  Mild chronic microvascular ischemic changes in the white matter.  Negative for acute infarct Regarding to the SVC narrowing, patient was seen by vascular surgeon and was recommended no intervention inpatient.  Patient to follow-up outpatient with vascular surgeon for evaluation. Patient also was treated for COPD exacerbation with hypoxia on exertion. Qualifies for home oxygen and hospitalist arrange home health and a nebulizer.  Patient was given a course of prednisone taper and empiric Levaquin. Patient was discharged and present today to follow-up with cytology results and further management plan   10/27/2020  PET scan showed right upper lobe primary bronchogenic mass with direct invasion into mediastinum. Metastatic disease to the right pleural space, lung, bone, nodes of the chest and less so lower leg/upper abdomen. Hypermetabolic him along the course of the right axillary vein with concurrent subtle hyper attenuation within the axillary vein and SVC.  This continues to the level of SVC narrowing.  Suspicious for SVC occlusion and developing thrombus. Moderate right pleural effusion and small pericardial effusion.   # # left supraclavicular mass biopsy- pathology is positive for adenocarcinoma. positive for CK7, with patchy, weak CK20.  They are  negative for TTF1, NapsinA, GATA3, CDX2, and Pax8.  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  #NGS showed AXIN 1 G4255, DIS2 M163fs, KRAS G12C, PRDM1 M1 [id number]*, [id number]>T, MS stable, TPS <1%    11/14/2020- 12/12/2020.  #Bronchial obstruction and SVC occlusion.  s/p palliative radiation #After 2 cycles of chemtherapy, patient had CT scan done due to shortness of breath.  01/05/2021, CT chest angiogram PE protocol showed no evidence of pulmonary embolism.  Stable disease.  Right upper lobe mass about 6.7 x 5.3 cm.  Widespread metastatic disease, lung and bone involvement. Interval development of right-sided hydronephrosis. 03/06/2021 finished T2 palliative radiation 03/22/2021, 6 cycles of carboplatin/Alimta/bevacizumab.  Infusion reaction of carboplatin during cycle 6.  Carboplatin discontinued. 04/07/2021 CT chest abdomen pelvis-partial response Mild decrease in size of right lung mass, mildly decreased mediastinal and hilar adenopathy.  Extensive right-sided pleural metastasis unchanged.  Similar appearance of diffuse bilateral pulmonary nodules.  Multifocal lytic  sclerotic bone   metastasis.  New superior endplate deformity of T6 04/12/2021 - 09/08/2021, patient was continued on bevacizumab and Alimta.   09/21/2021, bone scan showed multifocal abnormal uptake including mid to distal shaft of right femur, left femoral neck and trochanter, left sacroiliac region, left ischium.  Focal activity at the proximal tibia on the left.  Multi focal heterogeneous bilateral rib activity.  Focal activity at the sternum. 09/27/2021, CT chest abdomen pelvis with contrast showed progression of infrahilar left lower lobe with a concerning clearly progressive masslike consolidation opacity.  Mild progression of small left pleural effusion.  Similar experience of extensive irregular right-sided pleural disease.  Similar experience of bony metastasis.  09/29/2021 extra cycle of maintenance bevacizumab and Alimta while waiting for approval of Lumakras   10/20/2021 started on Lumakras  01/09/2022, CT chest abdomen pelvis with contrast showed no substantial interval changes in exam.  Stable disease. 03/20/2022, CT chest abdomen pelvis with contrast showed unchanged circumferential pleural thickening/pleural nodularity of the right hemithorax, left infrahilar mass and bilateral pulmonary nodules.  Stable right perihilar consolidation likely postradiation changes.  Unchanged numerous sclerotic osseous lesions.  Aortic atherosclerosis.   10/12/22 CT chest abdomen pelvis w contrast 1. No significant change in post treatment appearance of the chest.Perihilar post radiation fibrosis and consolidation of the right lung, mass of the superior segment left lower lobe, extensive right-greater-than-left pleural thickening and nodularity, pleural effusions, and numerous bilateral pulmonary nodules are unchanged. 2. Unchanged matted post treatment appearance of soft tissue throughout the mediastinum and right hilum. Unchanged enlarged left epicardial lymph nodes without other discretely visualized lymph  nodes in the chest. 3. Similar appearance of a lytic osseous metastatic lesion of the base of the right femoral neck, with additional unchanged widespread  sclerotic osseous metastatic disease throughout the included axial and proximal appendicular skeleton. 4. No evidence of new metastatic disease nor soft tissue metastatic disease in the abdomen or pelvis    Interval history-Mrs. Calixto is a 61-year-old female with above history who presents to clinic for management for low back pain that radiates down her right leg.  This is not a new pain and thought to be secondary to known neoplasm.  She is currently on OxyContin twice daily and oxycodone 5 mg every 6 hours as needed.  She is rotating ibuprofen and Tylenol and taking gabapentin 100 mg 3 times daily. Declined  bisphosphonate therapy.  Imaging from 10/12/2022 showed similar appearance of lytic osseous metastases and base of right femoral neck with additional unchanged widespread sclerotic osseous metastatic disease throughout the axial and proximal appendicular skeleton.  No evidence of new metastatic disease.  There.  She has had radiation to right femur which she completed back in May 2023.  Reports an Increase in pain over the past 2 weeks to right flank, thigh and leg that radiates to ankle. Reports pain meds have stopped working. She is having trouble eating and drinking and tries to eat one big meal daily. Has lost 12 lbs over the past month. Taking narcotics around the clock. States she needs something stronger. Denies falls.   ECOG FS:1 - Symptomatic but completely ambulatory  Review of systems- Review of Systems  Musculoskeletal:  Positive for back pain and joint pain.     Current treatment- Lumakras  Allergies  Allergen Reactions   Carboplatin Shortness Of Breath, Itching and Cough     Past Medical History:  Diagnosis Date   COPD (chronic obstructive pulmonary disease) (HCC)    GERD (gastroesophageal reflux disease)       Metastatic lung cancer (metastasis from lung to other site) (HCC) 11/02/2020     Past Surgical History:  Procedure Laterality Date   COLONOSCOPY WITH ESOPHAGOGASTRODUODENOSCOPY (EGD)     COLONOSCOPY WITH PROPOFOL N/A 11/14/2015   Procedure: COLONOSCOPY WITH PROPOFOL;  Surgeon: Paul Y Oh, MD;  Location: [facility];  Service: Gastroenterology;  Laterality: N/A;   ESOPHAGOGASTRODUODENOSCOPY (EGD) WITH PROPOFOL N/A 11/14/2015   Procedure: ESOPHAGOGASTRODUODENOSCOPY (EGD) WITH PROPOFOL;  Surgeon: Paul Y Oh, MD;  Location: [facility];  Service: Gastroenterology;  Laterality: N/A;    Social History   Socioeconomic History   Marital status: Widowed    Spouse name: Not on file   Number of children: Not on file   Years of education: Not on file   Highest education level: Not on file  Occupational History   Not on file  Tobacco Use   Smoking status: Former    Types: Cigarettes    Quit date: 09/2021    Years since quitting: 1.2   Smokeless tobacco: Never  Vaping Use   Vaping Use: Never used  Substance and Sexual Activity   Alcohol use: Never   Drug use: Never   Sexual activity: Not Currently  Other Topics Concern   Not on file  Social History Narrative   Not on file   Social Determinants of Health   Financial Resource Strain: Not on file  Food Insecurity: Not on file  Transportation Needs: Not on file  Physical Activity: Not on file  Stress: Not on file  Social Connections: Not on file  Intimate Partner Violence: Not on file    Family History  Problem Relation Age of Onset   Breast cancer Cousin 46   Diabetes type II Mother    Hypertension Mother    Colon cancer Father    Hypertension Father      Current Outpatient Medications:    acetaminophen (TYLENOL) 500 MG tablet, Take 1,000 mg by mouth every 8 (eight) hours as needed for moderate pain. , Disp: , Rfl:    albuterol (VENTOLIN HFA) 108 (90 Base) MCG/ACT inhaler, Inhale 2 puffs into the lungs every 6 (six)  hours as needed for wheezing or shortness of breath., Disp: 8 g, Rfl: 2   benzonatate (TESSALON) 100 MG capsule, Take 1 capsule by mouth three times daily as needed for cough, Disp: 60 capsule, Rfl: 0   calcium carbonate (TUMS - DOSED IN MG ELEMENTAL CALCIUM) 500 MG chewable tablet, Chew 1 tablet by mouth as needed., Disp: , Rfl:    dexamethasone (DECADRON) 4 MG tablet, Take 1 tablet (4 mg total) by mouth daily., Disp: 30 tablet, Rfl: 0   folic acid (FOLVITE) 1 MG tablet, Take 1 tablet (1 mg total) by mouth daily. Start 5-7 days before Alimta chemotherapy. Continue until 21 days after Alimta completed., Disp: 100 tablet, Rfl: 3   guaiFENesin (MUCINEX) 600 MG 12 hr tablet, Take 1 tablet (600 mg total) by mouth 2 (two) times daily., Disp: 14 tablet, Rfl: 0   ibuprofen (ADVIL) 800 MG tablet, Take 1 tablet (800 mg total) by mouth daily., Disp: 30 tablet, Rfl: 0   ipratropium-albuterol (DUONEB) 0.5-2.5 (3) MG/3ML SOLN, Take 3 mLs by nebulization 3 (three) times daily., Disp: 360 mL, Rfl: 1   LUMAKRAS 120 MG tablet, TAKE 8 TABLETS BY MOUTH DAILY, Disp: 240 tablet, Rfl: 2   naloxone (NARCAN) nasal spray 4 mg/0.1 mL, For suspected overdose of narcotics., Disp: 1 each, Rfl: 0   oxyCODONE (OXYCONTIN) 15 mg 12 hr   tablet, Take 1 tablet (15 mg total) by mouth every 12 (twelve) hours., Disp: 60 tablet, Rfl: 0   oxyCODONE (OXYCONTIN) 20 mg 12 hr tablet, Take 1 tablet (20 mg total) by mouth every 12 (twelve) hours., Disp: 90 tablet, Rfl: 0   phenazopyridine (PYRIDIUM) 95 MG tablet, Take 2 tablets (190 mg total) by mouth 3 (three) times daily as needed for pain., Disp: 6 tablet, Rfl: 0   ALPRAZolam (XANAX) 0.5 MG tablet, Take 1 tablet (0.5 mg total) by mouth See admin instructions. Take 1 tablet 60 minutes before procedure; if needed due to incomplete response and/or duration of procedure, may repeat another 1 tablet after 30 minutes. (Patient not taking: Reported on 12/05/2022), Disp: 2 tablet, Rfl: 0   gabapentin  (NEURONTIN) 100 MG capsule, Take 1 capsule (100 mg total) by mouth 3 (three) times daily. (Patient not taking: Reported on 06/21/2022), Disp: 90 capsule, Rfl: 0   magic mouthwash w/lidocaine SOLN, Take 5 mLs by mouth 4 (four) times daily as needed for mouth pain. Sig: Swish/Swallow 5-10 ml four times a day as needed. Dispense 480 ml. 1RF (Patient not taking: Reported on 06/21/2022), Disp: 480 mL, Rfl: 1   ondansetron (ZOFRAN) 8 MG tablet, Take 1 tablet (8 mg total) by mouth 2 (two) times daily as needed for refractory nausea / vomiting. Start on day 3 after chemo. (Patient not taking: Reported on 06/21/2022), Disp: 30 tablet, Rfl: 1   oxyCODONE (OXY IR/ROXICODONE) 5 MG immediate release tablet, Take 1-2 tablets (5-10 mg total) by mouth every 4 (four) hours as needed for moderate pain or severe pain., Disp: 120 tablet, Rfl: 0   Polyethylene Glycol 3350 (MIRALAX PO), Take by mouth daily as needed. (Patient not taking: Reported on 08/23/2022), Disp: , Rfl:    prochlorperazine (COMPAZINE) 10 MG tablet, Take 1 tablet (10 mg total) by mouth every 6 (six) hours as needed (Nausea or vomiting). (Patient not taking: Reported on 06/21/2022), Disp: 30 tablet, Rfl: 1   protein supplement shake (PREMIER PROTEIN) LIQD, Take 2 oz by mouth 4 (four) times daily., Disp: , Rfl:  No current facility-administered medications for this visit.  Facility-Administered Medications Ordered in Other Visits:    0.9 %  sodium chloride infusion, , Intravenous, Continuous, ,  E, NP, Last Rate: 999 mL/hr at 12/05/22 1528, New Bag at 12/05/22 1528   dexamethasone (DECADRON) 10 mg in sodium chloride 0.9 % 50 mL IVPB, 10 mg, Intravenous, Once, ,  E, NP, Last Rate: 204 mL/hr at 12/05/22 1550, 10 mg at 12/05/22 1550  Physical exam:  Vitals:   12/05/22 1400  BP: 118/76  Pulse: (!) 137  Resp: 20  Temp: 98.9 F (37.2 C)  TempSrc: Tympanic  SpO2: 96%  Weight: 136 lb 3.2 oz (61.8 kg)   Physical Exam Constitutional:       Appearance: Normal appearance.  Musculoskeletal:        General: Tenderness present.     Right lower leg: Tenderness and bony tenderness present.     Comments: Right anterior femoral pain extending down to tibia and occasionally to ankle. Able to bear weight.   Neurological:     Mental Status: She is alert and oriented to person, place, and time.         Latest Ref Rng & Units 12/05/2022    2:56 PM  CMP  Glucose 70 - 99 mg/dL 102   BUN 8 - 23 mg/dL 17   Creatinine 0.44 - 1.00 mg/dL 0.73   Sodium 135 -   145 mmol/L 137   Potassium 3.5 - 5.1 mmol/L 3.9   Chloride 98 - 111 mmol/L 100   CO2 22 - 32 mmol/L 25   Calcium 8.9 - 10.3 mg/dL 9.1   Total Protein 6.5 - 8.1 g/dL 8.6   Total Bilirubin 0.3 - 1.2 mg/dL 0.5   Alkaline Phos 38 - 126 U/L 63   AST 15 - 41 U/L 17   ALT 0 - 44 U/L 11       Latest Ref Rng & Units 12/05/2022    2:56 PM  CBC  WBC 4.0 - 10.5 K/uL 6.3   Hemoglobin 12.0 - 15.0 g/dL 12.1   Hematocrit 36.0 - 46.0 % 37.7   Platelets 150 - 400 K/uL 299     No images are attached to the encounter.  No results found.   Assessment and plan- Patient is a 61 y.o. female who presents today for increased right flank and leg pain behind worsened over the past 2 weeks.  Discussed with Dr. [name] and Altha Harm, NP who recommende liberalizing her current narcotic and adding 4 mg dexamethasone daily.  Will increase her oxycodone from 5 mg every 6 hours to 5 to 10 mg every 4 hours and increase OxyContin from 15 mg every 12 to 20 mg every 12.  New prescription sent to pharmacy.  Will also get imaging of right lumbar spine and femur.  Discussed with radiology that imaging will take greater than 1 hour and she will likely need sedation.  While in clinic today, we will draw labs (CBC, CMP), 2 mg morphine and 10 mg dexamethasone IV.  Disposition- RTC as scheduled next week to see Dr. [name].   Visit Diagnosis 1. Neoplasm related pain     Patient expressed understanding and was in  agreement with this plan. She also understands that She can call clinic at any time with any questions, concerns, or complaints.   Greater than 50% was spent in counseling and coordination of care with this patient including but not limited to discussion of the relevant topics above (See A&P) including, but not limited to diagnosis and management of acute and chronic medical conditions.   Thank you for allowing me to participate in the care of this very pleasant patient.    Jacquelin Hawking, NP [place] [facility] Cell - [phone] Pager- [phone] 12/05/2022 4:00 PM

## 2022-12-06 ENCOUNTER — Encounter: Payer: Self-pay | Admitting: Oncology

## 2022-12-06 ENCOUNTER — Ambulatory Visit
Admission: RE | Admit: 2022-12-06 | Discharge: 2022-12-06 | Disposition: A | Discharge status: Home / Self Care | Payer: Medicaid Other | Source: Ambulatory Visit | Attending: Oncology | Admitting: Oncology

## 2022-12-06 ENCOUNTER — Ambulatory Visit: Payer: Medicaid Other

## 2022-12-06 ENCOUNTER — Telehealth: Payer: Self-pay | Admitting: Oncology

## 2022-12-06 ENCOUNTER — Other Ambulatory Visit: Payer: Self-pay | Admitting: Oncology

## 2022-12-06 DIAGNOSIS — G893 Neoplasm related pain (acute) (chronic): Secondary | ICD-10-CM | POA: Insufficient documentation

## 2022-12-06 MED ORDER — IOHEXOL 350 MG/ML SOLN
75.0000 mL | Freq: Once | INTRAVENOUS | Status: AC | PRN
  Administered 2022-12-06: 75 mL via INTRAVENOUS

## 2022-12-06 NOTE — Progress Notes (Signed)
Results from CT scan for Mrs. Danziger.   Faythe Casa, NP 12/06/2022 1:02 PM

## 2022-12-06 NOTE — Telephone Encounter (Signed)
Re- Ct scan results  Discussed results with patient. All questions answered.   IMPRESSION: 1. Numerous sclerotic lesions throughout the lumbar spine and pelvis compatible with metastatic disease. These are stable from the prior CT and MRI. No new lesions. 2. Multilevel degenerative change in the lumbar spine. Borderline spinal stenosis L3-4 and L4-5. 3. Moderate right foraminal narrowing L5-S1 due to spurring and disc bulging. Mild left foraminal narrowing L5-S1. 4. Aortic atherosclerosis.  IMPRESSION: 1. Large lytic lesion with endosteal scalloping in the intertrochanteric region of the right femur measuring at least 2.1 x 2.4 x 3.3 cm CT examination dated October 12, 2022. There is increased risk for a pathological fracture of the femur. 2. Few other sclerotic lesions in the pelvis and visualized femur. Findings are concerning for metastatic disease. Recommend further evaluation with bone scan. 3. Muscles and subcutaneous soft tissues are within normal limits.  Discussed risk for pathological fracture.  Please let us know if anything changes.  Has picked up new pain prescriptions and is feeling much better today.  Has not needed much of her oxycodone today.  Has taken increased OxyContin and dexamethasone 4 mg today with improvement of her symptoms.  She is still able to bear weight on her right leg.  Has follow-up scheduled to see Dr. [name] next week.  Discussed options to speak and be referred to an orthopedic surgeon.  She continues to decline at this time.  Faythe Casa, NP 12/06/2022 2:36 PM

## 2022-12-11 ENCOUNTER — Encounter: Payer: Self-pay | Admitting: Oncology

## 2022-12-11 ENCOUNTER — Inpatient Hospital Stay: Payer: Medicaid Other

## 2022-12-11 ENCOUNTER — Inpatient Hospital Stay (HOSPITAL_BASED_OUTPATIENT_CLINIC_OR_DEPARTMENT_OTHER): Payer: Medicaid Other | Admitting: Oncology

## 2022-12-11 VITALS — BP 109/64 | HR 122 | Temp 97.6°F | Resp 18 | Wt 134.9 lb

## 2022-12-11 DIAGNOSIS — G893 Neoplasm related pain (acute) (chronic): Secondary | ICD-10-CM | POA: Diagnosis not present

## 2022-12-11 DIAGNOSIS — C3411 Malignant neoplasm of upper lobe, right bronchus or lung: Secondary | ICD-10-CM | POA: Diagnosis not present

## 2022-12-11 DIAGNOSIS — Z5111 Encounter for antineoplastic chemotherapy: Secondary | ICD-10-CM | POA: Diagnosis not present

## 2022-12-11 DIAGNOSIS — R52 Pain, unspecified: Secondary | ICD-10-CM | POA: Diagnosis not present

## 2022-12-11 DIAGNOSIS — C3490 Malignant neoplasm of unspecified part of unspecified bronchus or lung: Secondary | ICD-10-CM

## 2022-12-11 LAB — COMPREHENSIVE METABOLIC PANEL
ALT: 11 U/L (ref 0–44)
AST: 21 U/L (ref 15–41)
Albumin: 3.5 g/dL (ref 3.5–5.0)
Alkaline Phosphatase: 62 U/L (ref 38–126)
Anion gap: 8 (ref 5–15)
BUN: 14 mg/dL (ref 8–23)
CO2: 25 mmol/L (ref 22–32)
Calcium: 8.8 mg/dL — ABNORMAL LOW (ref 8.9–10.3)
Chloride: 102 mmol/L (ref 98–111)
Creatinine, Ser: 0.69 mg/dL (ref 0.44–1.00)
GFR, Estimated: >60 mL/min (ref >60)
Glucose, Bld: 107 mg/dL — ABNORMAL HIGH (ref 70–99)
Potassium: 4.2 mmol/L (ref 3.5–5.1)
Sodium: 135 mmol/L (ref 135–145)
Total Bilirubin: 0.5 mg/dL (ref 0.3–1.2)
Total Protein: 7.9 g/dL (ref 6.5–8.1)

## 2022-12-11 LAB — CBC WITH DIFFERENTIAL/PLATELET
Abs Immature Granulocytes: 0.04 10*3/uL (ref 0.00–0.07)
Basophils Absolute: 0 10*3/uL (ref 0.0–0.1)
Basophils Relative: 0 %
Eosinophils Absolute: 0 10*3/uL (ref 0.0–0.5)
Eosinophils Relative: 1 %
HCT: 36.6 % (ref 36.0–46.0)
Hemoglobin: 11.9 g/dL — ABNORMAL LOW (ref 12.0–15.0)
Immature Granulocytes: 1 %
Lymphocytes Relative: 11 %
Lymphs Abs: 0.7 10*3/uL (ref 0.7–4.0)
MCH: 27.2 pg (ref 26.0–34.0)
MCHC: 32.5 g/dL (ref 30.0–36.0)
MCV: 83.8 fL (ref 80.0–100.0)
Monocytes Absolute: 0.6 10*3/uL (ref 0.1–1.0)
Monocytes Relative: 10 %
Neutro Abs: 4.9 10*3/uL (ref 1.7–7.7)
Neutrophils Relative %: 77 %
Platelets: 274 10*3/uL (ref 150–400)
RBC: 4.37 MIL/uL (ref 3.87–5.11)
RDW: 15 % (ref 11.5–15.5)
WBC: 6.3 10*3/uL (ref 4.0–10.5)
nRBC: 0 % (ref 0.0–0.2)

## 2022-12-11 NOTE — Progress Notes (Signed)
Pt here for follow up. Pt reports right leg pain being 10/10. States she cannot sleep because of pain severity.

## 2022-12-11 NOTE — Progress Notes (Signed)
Hematology/Oncology Progress note [phone] [phone]     Patient Care Team: Pcp, No as PCP - General Telford Nab, RN as Oncology Nurse Navigator [name], MD as Medical Oncologist (Oncology)   Name of the patient: Brittney Fisher  [id number]  26-Dec-1960   ASSESSMENT & PLAN:   Cancer Staging  Primary malignant neoplasm of lung metastatic to other site [facility]) Staging form: Lung, AJCC 8th Edition - Clinical stage from 11/04/2020: Stage IV (cT4, cN3, cM1) - Signed by [name], MD on 11/04/2020   Primary malignant neoplasm of lung metastatic to other site [facility]) #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   cT4 N3 M1-  KRASG12C mutation, currently on second line treatment with Lumakras [Patient has high TMB, TPS less than 1%.  Immunotherapy in combination with chemotherapy will be other options in the future if lumakras is not effective] CT femur showed enlargement of right femur lesion, no fracture.  Labs reviewed and discussed with patient. Continue Lumakras.  Obtain CT chest abdomen pelvis in Jan 2024   Bone metastasis patient declines bisphosphonate. Recent CT scan showed increased right femur lesion, at risk of pathological fracture.  Refer to orthopedic surgery.  I also discussed with Dr. Baruch Gouty.  Patient may benefit from additional repeat radiation.  He will wait for orthopedic surgery recommendation.  Encounter for antineoplastic chemotherapy Treatment is listed above.  Neoplasm related pain Right thigh pain, lower back pain has improved.  status post palliative radiation. Increase to OxyContin 14m twice daily, and oxycodone 51-178mPRN as directed.  Recommend patient to resume garbapentin 10072mID, with further titration to 200m76mD in 3-6 days.  Continue Dexamethasone 4mg 60mly.    Orders Placed This Encounter  Procedures   CT CHEST ABDOMEN PELVIS W CONTRAST    Standing Status:   Future    Standing  Expiration Date:   12/12/2023    Order Specific Question:   If indicated for the ordered procedure, I authorize the administration of contrast media per Radiology protocol    Answer:   Yes    Order Specific Question:   Does the patient have a contrast media/X-ray dye allergy?    Answer:   No    Order Specific Question:   Preferred imaging location?    Answer:   [facility]    Order Specific Question:   Is Oral Contrast requested for this exam?    Answer:   Yes, Per Radiology protocol   CBC with Differential/Platelet    Standing Status:   Future    Standing Expiration Date:   12/11/2023   Comprehensive metabolic panel    Standing Status:   Future    Standing Expiration Date:   12/11/2023   Ambulatory Referral to Palliative Care    Referral Priority:   Routine    Referral Type:   Consultation    Referral Reason:   Symptom Managment    Number of Visits Requested:   1   Ambulatory referral to Orthopedic Surgery    Referral Priority:   Routine    Referral Type:   Surgical    Referral Reason:   Specialty Services Required    Requested Specialty:   Orthopedic Surgery    Number of Visits Requested:   1    Follow up in 4 weeks.  All questions were answered. The patient knows to call the clinic with any problems, questions or concerns.  Paisyn Guercio [name] PhD [facility] H[facility]ology Oncology 12/11/2022    PERTINENT  ONCOLOGY HISTORY 61 y.o. female with past medical history including GERD, COPD, current everyday smoker presents for follow-up of lung mass and pleural effusion. 10/20/2020, patient was brought to ED via EMS due to generalized weakness, dizziness, chest pain shortness of breath.  She was recently diagnosed with COPD approximately 1 month ago by primary care provider.  Also unintentional weight loss during the last few months. Image work-up showed right-sided pleural effusion.  She underwent right thoracentesis.  With removal of 2.4 L of fluid. 10/18/2020, CT chest with contrast  showed central right upper lobe pulmonary bronchogenic carcinoma with direct mediastinal invasion.  Metastatic disease to low cervical/thoracic nodes, lung, right pleural space and bones.  Moderate to large right pleural effusion.  SVC narrowing. 10/21/2020 MRI brain is negative for metastasis.  Mild chronic microvascular ischemic changes in the white matter.  Negative for acute infarct Regarding to the SVC narrowing, patient was seen by vascular surgeon and was recommended no intervention inpatient.  Patient to follow-up outpatient with vascular surgeon for evaluation. Patient also was treated for COPD exacerbation with hypoxia on exertion. Qualifies for home oxygen and hospitalist arrange home health and a nebulizer.  Patient was given a course of prednisone taper and empiric Levaquin. Patient was discharged and present today to follow-up with cytology results and further management plan  10/27/2020 PET scan showed right upper lobe primary bronchogenic mass with direct invasion into mediastinum. Metastatic disease to the right pleural space, lung, bone, nodes of the chest and less so lower leg/upper abdomen. Hypermetabolic him along the course of the right axillary vein with concurrent subtle hyper attenuation within the axillary vein and SVC.  This continues to the level of SVC narrowing.  Suspicious for SVC occlusion and developing thrombus. Moderate right pleural effusion and small pericardial effusion.  # # left supraclavicular mass biopsy- pathology is positive for adenocarcinoma. positive for CK7, with patchy, weak CK20.  They are  negative for TTF1, NapsinA, GATA3, CDX2, and Pax8.  The findings are  nonspecific; but may be compatible with a poorly differentiated  adenocarcinoma of lung origin, especially given the imaging findings  She also had another repeat thoracentesis and fluid cytology showed malignancy.  #NGS showed AXIN 1 [id number], DIS2 M124f, KRAS G12C, PRDM1 M1 [id number],  [id number] MS stable, TPS <1%   11/14/2020- 12/12/2020.  #Bronchial obstruction and SVC occlusion.  s/p palliative radiation #After 2 cycles of chemtherapy, patient had CT scan done due to shortness of breath.  01/05/2021, CT chest angiogram PE protocol showed no evidence of pulmonary embolism.  Stable disease.  Right upper lobe mass about 6.7 x 5.3 cm.  Widespread metastatic disease, lung and bone involvement. Interval development of right-sided hydronephrosis. 03/06/2021 finished T2 palliative radiation 03/22/2021, 6 cycles of carboplatin/Alimta/bevacizumab.  Infusion reaction of carboplatin during cycle 6.  Carboplatin discontinued. 04/07/2021 CT chest abdomen pelvis-partial response Mild decrease in size of right lung mass, mildly decreased mediastinal and hilar adenopathy.  Extensive right-sided pleural metastasis unchanged.  Similar appearance of diffuse bilateral pulmonary nodules.  Multifocal lytic sclerotic bone metastasis.  New superior endplate deformity of T6 04/12/2021 - 09/08/2021, patient was continued on bevacizumab and Alimta.  09/21/2021, bone scan showed multifocal abnormal uptake including mid to distal shaft of right femur, left femoral neck and trochanter, left sacroiliac region, left ischium.  Focal activity at the proximal tibia on the left.  Multi focal heterogeneous bilateral rib activity.  Focal activity at the sternum. 09/27/2021, CT chest abdomen pelvis with contrast showed progression of infrahilar left  lower lobe with a concerning clearly progressive masslike consolidation opacity.  Mild progression of small left pleural effusion.  Similar experience of extensive irregular right-sided pleural disease.  Similar experience of bony metastasis.  09/29/2021 extra cycle of maintenance bevacizumab and Alimta while waiting for approval of Lumakras  10/20/2021 started on Lumakras  01/09/2022, CT chest abdomen pelvis with contrast showed no substantial interval changes in exam.   Stable disease. 03/20/2022, CT chest abdomen pelvis with contrast showed unchanged circumferential pleural thickening/pleural nodularity of the right hemithorax, left infrahilar mass and bilateral pulmonary nodules.  Stable right perihilar consolidation likely postradiation changes.  Unchanged numerous sclerotic osseous lesions.  Aortic atherosclerosis.  10/12/22 CT chest abdomen pelvis w contrast 1. No significant change in post treatment appearance of the chest.Perihilar post radiation fibrosis and consolidation of the right lung, mass of the superior segment left lower lobe, extensive right-greater-than-left pleural thickening and nodularity, pleural effusions, and numerous bilateral pulmonary nodules are unchanged. 2. Unchanged matted post treatment appearance of soft tissue throughout the mediastinum and right hilum. Unchanged enlarged left epicardial lymph nodes without other discretely visualized lymph nodes in the chest. 3. Similar appearance of a lytic osseous metastatic lesion of the base of the right femoral neck, with additional unchanged widespread  sclerotic osseous metastatic disease throughout the included axial and proximal appendicular skeleton. 4. No evidence of new metastatic disease nor soft tissue metastatic disease in the abdomen or pelvis.     INTERVAL HISTORY CHRISTELL STEINMILLER is a 61 y.o. female who has above history reviewed by me today presents for follow up visit for management of metastatic lung cancer, acute visit for pain.  She reports increased right thigh pain.  10 out of 10.  Patient was seen by symptom management clinic last week and a pain medication was adjusted. She takes Oxycontin 15 mg twice daily, oxycodone 5 to 10 mg with tylenol PRN.  Patient feels the pain is not improved. Patient had a CT of the right thigh and CT lumbar done.    Review of Systems  Constitutional:  Negative for appetite change, chills, fatigue and fever.  HENT:   Negative for hearing  loss and voice change.   Eyes:  Negative for eye problems.  Respiratory:  Positive for shortness of breath. Negative for chest tightness and cough.   Cardiovascular:  Negative for chest pain.  Gastrointestinal:  Negative for abdominal distention, abdominal pain and blood in stool.  Endocrine: Negative for hot flashes.  Genitourinary:  Negative for difficulty urinating and frequency.   Musculoskeletal:  Negative for arthralgias.       Right thigh pain  Skin:  Negative for itching and rash.  Neurological:  Negative for extremity weakness.  Hematological:  Negative for adenopathy.  Psychiatric/Behavioral:  Positive for sleep disturbance. Negative for confusion.       Allergies  Allergen Reactions   Carboplatin Shortness Of Breath, Itching and Cough     Past Medical History:  Diagnosis Date   COPD (chronic obstructive pulmonary disease) (HCC)    GERD (gastroesophageal reflux disease)    Metastatic lung cancer (metastasis from lung to other site) (Circle) 11/02/2020     Past Surgical History:  Procedure Laterality Date   COLONOSCOPY WITH ESOPHAGOGASTRODUODENOSCOPY (EGD)     COLONOSCOPY WITH PROPOFOL N/A 11/14/2015   Procedure: COLONOSCOPY WITH PROPOFOL;  Surgeon: Hulen Luster, MD;  Location: [facility] ENDOSCOPY;  Service: Gastroenterology;  Laterality: N/A;   ESOPHAGOGASTRODUODENOSCOPY (EGD) WITH PROPOFOL N/A 11/14/2015   Procedure: ESOPHAGOGASTRODUODENOSCOPY (EGD) WITH PROPOFOL;  Surgeon: Hulen Luster, MD;  Location: [facility] ENDOSCOPY;  Service: Gastroenterology;  Laterality: N/A;    Social History   Socioeconomic History   Marital status: Widowed    Spouse name: Not on file   Number of children: Not on file   Years of education: Not on file   Highest education level: Not on file  Occupational History   Not on file  Tobacco Use   Smoking status: Former    Types: Cigarettes    Quit date: 09/2021    Years since quitting: 1.2   Smokeless tobacco: Never  Vaping Use   Vaping Use: Never  used  Substance and Sexual Activity   Alcohol use: Never   Drug use: Never   Sexual activity: Not Currently  Other Topics Concern   Not on file  Social History Narrative   Not on file   Social Determinants of Health   Financial Resource Strain: Not on file  Food Insecurity: Not on file  Transportation Needs: Not on file  Physical Activity: Not on file  Stress: Not on file  Social Connections: Not on file  Intimate Partner Violence: Not on file    Family History  Problem Relation Age of Onset   Breast cancer Cousin 12   Diabetes type II Mother    Hypertension Mother    Colon cancer Father    Hypertension Father      Current Outpatient Medications:    acetaminophen (TYLENOL) 500 MG tablet, Take 1,000 mg by mouth every 8 (eight) hours as needed for moderate pain. , Disp: , Rfl:    albuterol (VENTOLIN HFA) 108 (90 Base) MCG/ACT inhaler, Inhale 2 puffs into the lungs every 6 (six) hours as needed for wheezing or shortness of breath., Disp: 8 g, Rfl: 2   benzonatate (TESSALON) 100 MG capsule, Take 1 capsule by mouth three times daily as needed for cough, Disp: 60 capsule, Rfl: 0   calcium carbonate (TUMS - DOSED IN MG ELEMENTAL CALCIUM) 500 MG chewable tablet, Chew 1 tablet by mouth as needed., Disp: , Rfl:    dexamethasone (DECADRON) 4 MG tablet, Take 1 tablet (4 mg total) by mouth daily., Disp: 30 tablet, Rfl: 0   folic acid (FOLVITE) 1 MG tablet, Take 1 tablet (1 mg total) by mouth daily. Start 5-7 days before Alimta chemotherapy. Continue until 21 days after Alimta completed., Disp: 100 tablet, Rfl: 3   gabapentin (NEURONTIN) 100 MG capsule, Take 1 capsule (100 mg total) by mouth 3 (three) times daily., Disp: 90 capsule, Rfl: 0   guaiFENesin (MUCINEX) 600 MG 12 hr tablet, Take 1 tablet (600 mg total) by mouth 2 (two) times daily., Disp: 14 tablet, Rfl: 0   ibuprofen (ADVIL) 800 MG tablet, Take 1 tablet (800 mg total) by mouth daily., Disp: 30 tablet, Rfl: 0    ipratropium-albuterol (DUONEB) 0.5-2.5 (3) MG/3ML SOLN, Take 3 mLs by nebulization 3 (three) times daily., Disp: 360 mL, Rfl: 1   LUMAKRAS 120 MG tablet, TAKE 8 TABLETS BY MOUTH DAILY, Disp: 240 tablet, Rfl: 2   oxyCODONE (OXY IR/ROXICODONE) 5 MG immediate release tablet, Take 1-2 tablets (5-10 mg total) by mouth every 4 (four) hours as needed for moderate pain or severe pain., Disp: 120 tablet, Rfl: 0   oxyCODONE (OXYCONTIN) 15 mg 12 hr tablet, Take 1 tablet (15 mg total) by mouth every 12 (twelve) hours., Disp: 60 tablet, Rfl: 0   phenazopyridine (PYRIDIUM) 95 MG tablet, Take 2 tablets (190 mg total) by mouth 3 (  three) times daily as needed for pain., Disp: 6 tablet, Rfl: 0   Polyethylene Glycol 3350 (MIRALAX PO), Take by mouth daily as needed., Disp: , Rfl:    protein supplement shake (PREMIER PROTEIN) LIQD, Take 2 oz by mouth 4 (four) times daily., Disp: , Rfl:    ALPRAZolam (XANAX) 0.5 MG tablet, Take 1 tablet (0.5 mg total) by mouth See admin instructions. Take 1 tablet 60 minutes before procedure; if needed due to incomplete response and/or duration of procedure, may repeat another 1 tablet after 30 minutes. (Patient not taking: Reported on 12/05/2022), Disp: 2 tablet, Rfl: 0   magic mouthwash w/lidocaine SOLN, Take 5 mLs by mouth 4 (four) times daily as needed for mouth pain. Sig: Swish/Swallow 5-10 ml four times a day as needed. Dispense 480 ml. 1RF (Patient not taking: Reported on 06/21/2022), Disp: 480 mL, Rfl: 1   naloxone (NARCAN) nasal spray 4 mg/0.1 mL, For suspected overdose of narcotics. (Patient not taking: Reported on 12/11/2022), Disp: 1 each, Rfl: 0   ondansetron (ZOFRAN) 8 MG tablet, Take 1 tablet (8 mg total) by mouth 2 (two) times daily as needed for refractory nausea / vomiting. Start on day 3 after chemo. (Patient not taking: Reported on 06/21/2022), Disp: 30 tablet, Rfl: 1   oxyCODONE (OXYCONTIN) 20 mg 12 hr tablet, Take 1 tablet (20 mg total) by mouth every 12 (twelve) hours.  (Patient not taking: Reported on 12/11/2022), Disp: 90 tablet, Rfl: 0   prochlorperazine (COMPAZINE) 10 MG tablet, Take 1 tablet (10 mg total) by mouth every 6 (six) hours as needed (Nausea or vomiting). (Patient not taking: Reported on 06/21/2022), Disp: 30 tablet, Rfl: 1  Physical exam: ECOG 1 Vitals:   12/11/22 1355  BP: 109/64  Pulse: (!) 122  Resp: 18  Temp: 97.6 F (36.4 C)  SpO2: 97%  Weight: 134 lb 14.4 oz (61.2 kg)   Physical Exam Constitutional:      General: She is not in acute distress.    Comments: Thin built female walks independently  HENT:     Head: Normocephalic and atraumatic.  Eyes:     General: No scleral icterus. Cardiovascular:     Rate and Rhythm: Regular rhythm. Tachycardia present.     Heart sounds: Normal heart sounds.  Pulmonary:     Effort: Pulmonary effort is normal. No respiratory distress.     Breath sounds: No wheezing.     Comments: Slightly decreased breath sound bilaterally.  Abdominal:     General: Bowel sounds are normal. There is no distension.     Palpations: Abdomen is soft.  Musculoskeletal:        General: No deformity. Normal range of motion.     Cervical back: Normal range of motion and neck supple.  Skin:    General: Skin is warm and dry.     Findings: No erythema or rash.  Neurological:     Mental Status: She is alert and oriented to person, place, and time. Mental status is at baseline.     Cranial Nerves: No cranial nerve deficit.     Coordination: Coordination normal.  Psychiatric:        Mood and Affect: Mood normal.    LABORATORY STUDIES     Latest Ref Rng & Units 12/11/2022    1:38 PM 12/05/2022    2:56 PM 11/01/2022    1:30 PM  CBC  WBC 4.0 - 10.5 K/uL 6.3  6.3  4.4   Hemoglobin 12.0 - 15.0 g/dL 11.9  12.1  11.7   Hematocrit 36.0 - 46.0 % 36.6  37.7  36.9   Platelets 150 - 400 K/uL 274  299  213       Latest Ref Rng & Units 12/11/2022    1:38 PM 12/05/2022    2:56 PM 11/01/2022    1:30 PM  CMP  Glucose  70 - 99 mg/dL 107  102  92   BUN 8 - 23 mg/dL _0 Creatinine 0.44 - 1.00 mg/dL 0.69  0.73  0.79   Sodium 135 - 145 mmol/L 135  137  135   Potassium 3.5 - 5.1 mmol/L 4.2  3.9  4.2   Chloride 98 - 111 mmol/L 102  100  99   CO2 22 - 32 mmol/L _1 Calcium 8.9 - 10.3 mg/dL 8.8  9.1  9.3   Total Protein 6.5 - 8.1 g/dL 7.9  8.6  8.0   Total Bilirubin 0.3 - 1.2 mg/dL 0.5  0.5  0.4   Alkaline Phos 38 - 126 U/L 62  63  62   AST 15 - 41 U/L _2 ALT 0 - 44 U/L _3 RADIOGRAPHIC STUDIES: I have personally reviewed the radiological images as listed and agreed with the findings in the report. CT Lumbar Spine W Contrast  Result Date: 12/06/2022 CLINICAL DATA:  Right flank and leg pain.  History of lung cancer EXAM: CT LUMBAR SPINE WITH CONTRAST TECHNIQUE: Multidetector CT imaging of the lumbar spine was performed with intravenous contrast administration. RADIATION DOSE REDUCTION: This exam was performed according to the departmental dose-optimization program which includes automated exposure control, adjustment of the mA and/or kV according to patient size and/or use of iterative reconstruction technique. CONTRAST:  22m OMNIPAQUE IOHEXOL 350 MG/ML SOLN COMPARISON:  MRI lumbar spine 06/08/2022. CT abdomen pelvis 10/12/2022 FINDINGS: Segmentation: Normal segmentation. Five lumbar vertebra. Lowest disc space L5-S1 Alignment: Mild anterolisthesis L5-S1 otherwise normal alignment Vertebrae: Negative for fracture Numerous sclerotic lesions throughout the lumbar spine as well as the sacrum and iliac bones, stable from the prior CT and MRI. Findings compatible with stable metastatic disease. No new lesions identified. Paraspinal and other soft tissues: Atherosclerotic aorta without aneurysm. No retroperitoneal mass or adenopathy. Subcentimeter left periaortic lymph nodes stable from the prior CT. Disc levels: L1-2: Mild degenerative change.  Negative for stenosis L2-3: Mild disc  bulging and mild facet degeneration. Negative for stenosis L3-4: Diffuse bulging of the disc with borderline spinal stenosis. Subarticular stenosis on the right L4-5: Diffuse bulging of the disc and bilateral facet degeneration. Borderline spinal stenosis. Mild subarticular stenosis bilaterally L5-S1: Disc bulging and facet degeneration. Moderate right foraminal narrowing due to spurring and disc bulging. Mild left foraminal narrowing. IMPRESSION: 1. Numerous sclerotic lesions throughout the lumbar spine and pelvis compatible with metastatic disease. These are stable from the prior CT and MRI. No new lesions. 2. Multilevel degenerative change in the lumbar spine. Borderline spinal stenosis L3-4 and L4-5. 3. Moderate right foraminal narrowing L5-S1 due to spurring and disc bulging. Mild left foraminal narrowing L5-S1. 4. Aortic atherosclerosis. Aortic Atherosclerosis (ICD10-I70.0). Electronically Signed   By: CFranchot GalloM.D.   On: 12/06/2022 12:52   CT FEMUR RIGHT W CONTRAST  Result Date: 12/06/2022 CLINICAL DATA:  Right lower back pain radiating to the right upper leg. EXAM: CT OF THE LOWER RIGHT EXTREMITY WITH CONTRAST TECHNIQUE: Multidetector  CT imaging of the lower right extremity was performed according to the standard protocol following intravenous contrast administration. RADIATION DOSE REDUCTION: This exam was performed according to the departmental dose-optimization program which includes automated exposure control, adjustment of the mA and/or kV according to patient size and/or use of iterative reconstruction technique. CONTRAST:  84m OMNIPAQUE IOHEXOL 350 MG/ML SOLN COMPARISON:  None Available. FINDINGS: Bones/Joint/Cartilage There is a lytic lesion with endosteal scalloping in the intertrochanteric region measuring approximately 2.1 x 2.4 x 3.3 cm. Few other sclerotic lesions in the pelvis and visualized femur the largest in the mid to distal femoral diaphysis measuring at least 1.0 x 0.7 x 1.6  cm. Ligaments Suboptimally assessed by CT. Muscles and Tendons Muscles are normal in bulk. No intramuscular hematoma or fluid collection. Soft tissues Skin and subcutaneous soft tissues are within normal limits. IMPRESSION: 1. Large lytic lesion with endosteal scalloping in the intertrochanteric region of the right femur measuring at least 2.1 x 2.4 x 3.3 cm CT examination dated October 12, 2022. There is increased risk for a pathological fracture of the femur. 2. Few other sclerotic lesions in the pelvis and visualized femur. Findings are concerning for metastatic disease. Recommend further evaluation with bone scan. 3. Muscles and subcutaneous soft tissues are within normal limits. Electronically Signed   By: IKeane PoliceD.O.   On: 12/06/2022 11:06   CT CHEST ABDOMEN PELVIS W CONTRAST  Result Date: 10/15/2022 CLINICAL DATA:  Metastatic lung cancer restaging, ongoing chemotherapy * Tracking Code: BO * EXAM: CT CHEST, ABDOMEN, AND PELVIS WITH CONTRAST TECHNIQUE: Multidetector CT imaging of the chest, abdomen and pelvis was performed following the standard protocol during bolus administration of intravenous contrast. RADIATION DOSE REDUCTION: This exam was performed according to the departmental dose-optimization program which includes automated exposure control, adjustment of the mA and/or kV according to patient size and/or use of iterative reconstruction technique. CONTRAST:  1028mOMNIPAQUE IOHEXOL 300 MG/ML  SOLN COMPARISON:  07/25/2022 FINDINGS: CT CHEST FINDINGS Cardiovascular: Aortic atherosclerosis. Normal heart size. No pericardial effusion. Mediastinum/Nodes: Unchanged matted post treatment appearance of soft tissue throughout the mediastinum and right hilum. Unchanged enlarged left epicardial lymph nodes measuring up to 1.7 x 1.1 cm (series 2, image 50), without other discretely visualized lymph nodes. Thyroid gland, trachea, and esophagus demonstrate no significant findings. Lungs/Pleura: No  significant change in post treatment appearance of the chest. Perihilar post radiation fibrosis and consolidation of the right lung, mass of the superior segment left lower lobe measuring approximately 3.4 x 3.3 cm (series 4, image 86), as well as extensive right-greater-than-left pleural thickening and nodularity as well as small bilateral pleural effusions are all unchanged. Numerous bilateral pulmonary nodules likewise unchanged. Musculoskeletal: No chest wall abnormality. No acute osseous findings. CT ABDOMEN PELVIS FINDINGS Hepatobiliary: No solid liver abnormality is seen. No gallstones, gallbladder wall thickening, or biliary dilatation. Pancreas: Unremarkable. No pancreatic ductal dilatation or surrounding inflammatory changes. Spleen: Normal in size without significant abnormality. Adrenals/Urinary Tract: Adrenal glands are unremarkable. Simple, benign bilateral renal cortical and parapelvic cysts, for which no further follow-up or characterization is required. Kidneys are otherwise normal, without renal calculi, solid lesion, or hydronephrosis. Bladder is unremarkable. Stomach/Bowel: Stomach is within normal limits. Appendix appears normal. No evidence of bowel wall thickening, distention, or inflammatory changes. Vascular/Lymphatic: Aortic atherosclerosis. No enlarged abdominal or pelvic lymph nodes. Reproductive: No mass or other abnormality. Other: No abdominal wall hernia or abnormality. No ascites. Musculoskeletal: No acute osseous findings. Similar appearance of a lytic osseous metastatic lesion  of the base of the right femoral neck (series 5, image 73), with additional unchanged widespread sclerotic osseous metastatic disease throughout the included axial and proximal appendicular skeleton. IMPRESSION: 1. No significant change in post treatment appearance of the chest. 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 2. Unchanged matted post treatment appearance of soft tissue throughout the mediastinum and right hilum. Unchanged enlarged left epicardial lymph nodes without other discretely visualized lymph nodes in the chest. 3. Similar appearance of a lytic osseous metastatic lesion of the base of the right femoral neck, with additional unchanged widespread sclerotic osseous metastatic disease throughout the included axial and proximal appendicular skeleton. 4. No evidence of new metastatic disease nor soft tissue metastatic disease in the abdomen or pelvis. Aortic Atherosclerosis (ICD10-I70.0). Electronically Signed   By: Delanna Ahmadi M.D.   On: 10/15/2022 11:25

## 2022-12-11 NOTE — Assessment & Plan Note (Signed)
Right thigh pain, lower back pain has improved.  status post palliative radiation. Increase to OxyContin 20mg  twice daily, and oxycodone 51-10mg  PRN as directed.  Recommend patient to resume garbapentin 100mg  TID, with further titration to 200mg  TID in 3-6 days.  Continue Dexamethasone 4mg  daily.

## 2022-12-11 NOTE — Assessment & Plan Note (Signed)
Treatment is listed above.

## 2022-12-11 NOTE — Assessment & Plan Note (Addendum)
patient declines bisphosphonate. Recent CT scan showed increased right femur lesion, at risk of pathological fracture.  Refer to orthopedic surgery.  I also discussed with Dr. Baruch Gouty.  Patient may benefit from additional repeat radiation.  He will wait for orthopedic surgery recommendation.

## 2022-12-11 NOTE — Assessment & Plan Note (Addendum)
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   cT4 N3 M1-  KRASG12C mutation, currently on second line treatment with Lumakras [Patient has high TMB, TPS less than 1%.  Immunotherapy in combination with chemotherapy will be other options in the future if lumakras is not effective] CT femur showed enlargement of right femur lesion, no fracture.  Labs reviewed and discussed with patient. Continue Lumakras.  Obtain CT chest abdomen pelvis in Jan 2024

## 2022-12-12 ENCOUNTER — Encounter: Payer: Self-pay | Admitting: Hospice and Palliative Medicine

## 2022-12-12 ENCOUNTER — Inpatient Hospital Stay (HOSPITAL_BASED_OUTPATIENT_CLINIC_OR_DEPARTMENT_OTHER): Payer: Medicaid Other | Admitting: Hospice and Palliative Medicine

## 2022-12-12 VITALS — BP 114/69 | HR 118 | Temp 96.8°F | Resp 18 | Ht 71.0 in

## 2022-12-12 DIAGNOSIS — Z515 Encounter for palliative care: Secondary | ICD-10-CM | POA: Diagnosis not present

## 2022-12-12 DIAGNOSIS — C3411 Malignant neoplasm of upper lobe, right bronchus or lung: Secondary | ICD-10-CM | POA: Diagnosis not present

## 2022-12-12 DIAGNOSIS — C3490 Malignant neoplasm of unspecified part of unspecified bronchus or lung: Secondary | ICD-10-CM

## 2022-12-12 DIAGNOSIS — R52 Pain, unspecified: Secondary | ICD-10-CM | POA: Diagnosis not present

## 2022-12-12 MED ORDER — FLUTICASONE-SALMETEROL 45-21 MCG/ACT IN AERO: 2.0000 | INHALATION_SPRAY | Freq: Two times a day (BID) | RESPIRATORY_TRACT | 12 refills | Status: DC

## 2022-12-12 NOTE — Progress Notes (Signed)
Brittney Fisher at [facility] Telephone:([phone]) [phone] [phone]   Name: Brittney Fisher Date: 12/12/2022 MRN: [id number]  DOB: Feb 19, 1961  Patient Care Team: Pcp, No as PCP - General Telford Nab, RN as Oncology Nurse Navigator [name], MD as Medical Oncologist (Oncology)    REASON FOR CONSULTATION: Brittney Fisher is a 61 y.o. 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  Patient was referred to palliative care to help address goals and manage ongoing symptoms.   .   SOCIAL HISTORY:     reports that she quit smoking about 14 months ago. Her smoking use included cigarettes. She has never used smokeless tobacco. She reports that she does not drink alcohol and does not use drugs.  Patient is widowed.  She has no children.  She lives at home with a niece.  She has another niece and sister who are involved in her care.  Patient previously worked at [employer].   ADVANCE DIRECTIVES:  Patient's niece, Brittney Fisher, is her healthcare power of attorney.  CODE STATUS:   PAST MEDICAL HISTORY: Past Medical History:  Diagnosis Date   COPD (chronic obstructive pulmonary disease) (HCC)    GERD (gastroesophageal reflux disease)    Metastatic lung cancer (metastasis from lung to other site) ([place]) 11/02/2020    PAST SURGICAL HISTORY:  Past Surgical History:  Procedure Laterality Date   COLONOSCOPY WITH ESOPHAGOGASTRODUODENOSCOPY (EGD)     COLONOSCOPY WITH PROPOFOL N/A 11/14/2015   Procedure: COLONOSCOPY WITH PROPOFOL;  Surgeon: Hulen Luster, MD;  Location: [facility] ENDOSCOPY;  Service: Gastroenterology;  Laterality: N/A;   ESOPHAGOGASTRODUODENOSCOPY (EGD) WITH PROPOFOL N/A 11/14/2015   Procedure: ESOPHAGOGASTRODUODENOSCOPY (EGD) WITH PROPOFOL;  Surgeon: Hulen Luster, MD;  Location: [facility] ENDOSCOPY;  Service: Gastroenterology;  Laterality: N/A;     HEMATOLOGY/ONCOLOGY HISTORY:  Oncology History  Metastatic lung cancer (metastasis from lung to other site) ([place])  11/02/2020 Initial Diagnosis   Metastatic lung cancer (metastasis from lung to other site) [facility])   11/18/2020 - 09/29/2021 Chemotherapy   Patient is on Treatment Plan : LUNG PEMEtrexed (Alimta) + CARBOplatin + Bevacizumab q21d x 1 cycle      03/20/2022 Imaging   CT chest abdomen pelvis w contrast 1. Unchanged circumferential pleural thickening and pleural nodularity of the right hemithorax, left infrahilar mass, and bilateral pulmonary nodules. 2. Stable right perihilar consolidation, likely post radiation changes. 3. Unchanged numerous sclerotic osseous lesions.4.  Aortic Atherosclerosis (ICD10-I70.0).    06/08/2022 Imaging   MRI lumbar spine w and wo 1. Unchanged appearance of multiple sclerotic lesions involving all lumbar levels, consistent with osseous metastatic disease.2. Severe right L5-S1 neural foraminal stenosis.3. Mild right L4-5 neural foraminal stenosis  MR Sacrum SI Joints w wo contrast 1. Multiple sclerotic lesions of sacrum and bilateral ilia with enhancement on post contrast sequences consistent with metastatic disease.2.  No evidence of sacroiliitis or joint effusion. 3.  No significant finding of the neurovascular bundle bilaterally     Imaging     Primary malignant neoplasm of lung metastatic to other site [facility])  11/04/2020 Initial Diagnosis   Primary malignant neoplasm of lung metastatic to other site [facility])  10/20/2020, patient was brought to ED via EMS due to generalized weakness, dizziness, chest pain shortness of breath.  She was recently diagnosed with COPD approximately 1 month ago by primary care provider.  Also unintentional weight loss during the last few months.  Image work-up showed right-sided pleural effusion.  She underwent right thoracentesis.  With removal of 2.4 L of fluid. 10/18/2020, CT chest with contrast showed central right upper  lobe pulmonary bronchogenic carcinoma with direct mediastinal invasion.  Metastatic disease to low cervical/thoracic nodes, lung, right pleural space and bones.  Moderate to large right pleural effusion.  SVC narrowing. 10/21/2020 MRI brain is negative for metastasis.  Mild chronic microvascular ischemic changes in the white matter.  Negative for acute infarct Regarding to the SVC narrowing, patient was seen by vascular surgeon and was recommended no intervention inpatient.  Patient to follow-up outpatient with vascular surgeon for evaluation. Patient also was treated for COPD exacerbation with hypoxia on exertion. Qualifies for home oxygen and hospitalist arrange home health and a nebulizer.  Patient was given a course of prednisone taper and empiric Levaquin. Patient was discharged and present today to follow-up with cytology results and further management plan   10/27/2020 PET scan showed right upper lobe primary bronchogenic mass with direct invasion into mediastinum. Metastatic disease to the right pleural space, lung, bone, nodes of the chest and less so lower leg/upper abdomen. Hypermetabolic him along the course of the right axillary vein with concurrent subtle hyper attenuation within the axillary vein and SVC.  This continues to the level of SVC narrowing.  Suspicious for SVC occlusion and developing thrombus. Moderate right pleural effusion and small pericardial effusion.   # # left supraclavicular mass biopsy- pathology is positive for adenocarcinoma. positive for CK7, with patchy, weak CK20.  They are  negative for TTF1, NapsinA, GATA3, CDX2, and Pax8.  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  #NGS showed AXIN 1 [id number], DIS2 M134f, KRAS G12C, PRDM1 M1 [id number], [id number] MS stable, TPS <1%       11/04/2020 Cancer Staging   Staging form: Lung, AJCC 8th Edition - Clinical stage from 11/04/2020: Stage IV (cT4, cN3, cM1) - Signed by YEarlie Server MD on 11/04/2020   11/14/2020 - 12/12/2020 Radiation Therapy   #Bronchial obstruction and SVC occlusion.  s/p palliative radiation   11/18/2020 - 03/22/2022 Chemotherapy   carboplatin/Alimta/bevacizumab x 6 cycles   Infusion reaction of carboplatin during cycle 6.  Carboplatin discontinued.   04/07/2021 Imaging   CT chest abdomen pelvis-partial response Mild decrease in size of right lung mass, mildly decreased mediastinal and hilar adenopathy.  Extensive right-sided pleural metastasis unchanged.  Similar appearance of diffuse bilateral pulmonary nodules.  Multifocal lytic sclerotic bone metastasis.  New superior endplate deformity of T6   04/12/2021 - 09/29/2021 Chemotherapy   04/12/2021 - 09/08/2021 bevacizumab and Alimta.  09/29/2021 extra cycle of maintenance bevacizumab and Alimta while waiting for approval of Lumakras   09/21/2021 Imaging    bone scan showed multifocal abnormal uptake including mid to distal shaft of right femur, left femoral neck and trochanter, left sacroiliac region, left ischium.  Focal activity at the proximal tibia on the left.  Multi focal heterogeneous bilateral rib activity.  Focal activity at the sternum.   09/27/2021 Imaging   CT chest abdomen pelvis with contrast showed progression of infrahilar left lower lobe with a concerning clearly progressive masslike consolidation opacity.  Mild progression of small left pleural effusion.  Similar experience of extensive irregular right-sided pleural disease.  Similar experience of bony metastasis.    10/20/2021 -  Chemotherapy   Started  on Lumakras   01/09/2022 Imaging   CT chest abdomen pelvis with contrast showed no substantial interval changes in exam.  Stable disease   03/20/2022 -  Chemotherapy   CT chest abdomen pelvis with contrast showed unchanged  circumferential pleural thickening/pleural nodularity of the right hemithorax, left infrahilar mass and bilateral pulmonary nodules.  Stable right perihilar consolidation likely postradiation changes.  Unchanged numerous sclerotic osseous lesions.  Aortic atherosclerosis.     06/08/2022 Imaging   MRI sacrum SI joint w wo contrast Showed 1. Multiple sclerotic lesions of sacrum and bilateral ilia with enhancement on post contrast sequences consistent with metastatic disease. 2.  No evidence of sacroiliitis or joint effusion.3.  No significant finding of the neurovascular bundle bilaterally    06/08/2022 Imaging   MRI lumbar spine w wo contrast 1. Unchanged appearance of multiple sclerotic lesions involving all lumbar levels, consistent with osseous metastatic disease.2. Severe right L5-S1 neural foraminal stenosis.3. Mild right L4-5 neural foraminal stenosis    07/26/2022 Imaging   CT chest abdomen pelvis with contrast 1. Redemonstrated post treatment appearance of the chest with masslike consolidation and fibrosis of the perihilar right lung , mass of the superior segment left lower lobe, as well as extensive bilateral pleural thickening and nodularity, greater on the right, and numerous bilateral pulmonary nodules. 2. Increased nodularity about the right lung base, consistent with worsened pleural metastatic disease. Other nodules unchanged. 3. Unchanged post treatment appearance of mediastinal and right hilar lymph nodes as well as epicardial lymph nodes about the left ventricular apex. 4. Slightly enlarged lytic osseous metastatic lesion of the base of the right femoral neck measuring 2.4 x 2.2 cm, previously 2.2 x 1.8 cm. This is potentially at risk for pathologic fracture given location.5. Otherwise unchanged widespread sclerotic osseous metastatic disease throughout the axial and proximal appendicular skeleton. 6. No evidence of soft tissue metastatic disease to the abdomen or pelvis. 6. No  evidence of soft tissue metastatic disease to the abdomen or pelvis.     ALLERGIES:  is allergic to carboplatin.  MEDICATIONS:  Current Outpatient Medications  Medication Sig Dispense Refill   acetaminophen (TYLENOL) 500 MG tablet Take 1,000 mg by mouth every 8 (eight) hours as needed for moderate pain.      albuterol (VENTOLIN HFA) 108 (90 Base) MCG/ACT inhaler Inhale 2 puffs into the lungs every 6 (six) hours as needed for wheezing or shortness of breath. 8 g 2   ALPRAZolam (XANAX) 0.5 MG tablet Take 1 tablet (0.5 mg total) by mouth See admin instructions. Take 1 tablet 60 minutes before procedure; if needed due to incomplete response and/or duration of procedure, may repeat another 1 tablet after 30 minutes. (Patient not taking: Reported on 12/05/2022) 2 tablet 0   benzonatate (TESSALON) 100 MG capsule Take 1 capsule by mouth three times daily as needed for cough 60 capsule 0   calcium carbonate (TUMS - DOSED IN MG ELEMENTAL CALCIUM) 500 MG chewable tablet Chew 1 tablet by mouth as needed.     dexamethasone (DECADRON) 4 MG tablet Take 1 tablet (4 mg total) by mouth daily. 30 tablet 0   folic acid (FOLVITE) 1 MG tablet Take 1 tablet (1 mg total) by mouth daily. Start 5-7 days before Alimta chemotherapy. Continue until 21 days after Alimta completed. 100 tablet 3   gabapentin (NEURONTIN) 100 MG capsule Take 1 capsule (100 mg total) by mouth 3 (three) times daily. 90 capsule 0   guaiFENesin (MUCINEX) 600 MG 12 hr tablet Take 1  tablet (600 mg total) by mouth 2 (two) times daily. 14 tablet 0   ibuprofen (ADVIL) 800 MG tablet Take 1 tablet (800 mg total) by mouth daily. 30 tablet 0   ipratropium-albuterol (DUONEB) 0.5-2.5 (3) MG/3ML SOLN Take 3 mLs by nebulization 3 (three) times daily. 360 mL 1   LUMAKRAS 120 MG tablet TAKE 8 TABLETS BY MOUTH DAILY 240 tablet 2   magic mouthwash w/lidocaine SOLN Take 5 mLs by mouth 4 (four) times daily as needed for mouth pain. Sig: Swish/Swallow 5-10 ml four  times a day as needed. Dispense 480 ml. 1RF (Patient not taking: Reported on 06/21/2022) 480 mL 1   naloxone (NARCAN) nasal spray 4 mg/0.1 mL For suspected overdose of narcotics. (Patient not taking: Reported on 12/11/2022) 1 each 0   ondansetron (ZOFRAN) 8 MG tablet Take 1 tablet (8 mg total) by mouth 2 (two) times daily as needed for refractory nausea / vomiting. Start on day 3 after chemo. (Patient not taking: Reported on 06/21/2022) 30 tablet 1   oxyCODONE (OXY IR/ROXICODONE) 5 MG immediate release tablet Take 1-2 tablets (5-10 mg total) by mouth every 4 (four) hours as needed for moderate pain or severe pain. 120 tablet 0   oxyCODONE (OXYCONTIN) 15 mg 12 hr tablet Take 1 tablet (15 mg total) by mouth every 12 (twelve) hours. 60 tablet 0   oxyCODONE (OXYCONTIN) 20 mg 12 hr tablet Take 1 tablet (20 mg total) by mouth every 12 (twelve) hours. (Patient not taking: Reported on 12/11/2022) 90 tablet 0   phenazopyridine (PYRIDIUM) 95 MG tablet Take 2 tablets (190 mg total) by mouth 3 (three) times daily as needed for pain. 6 tablet 0   Polyethylene Glycol 3350 (MIRALAX PO) Take by mouth daily as needed.     prochlorperazine (COMPAZINE) 10 MG tablet Take 1 tablet (10 mg total) by mouth every 6 (six) hours as needed (Nausea or vomiting). (Patient not taking: Reported on 06/21/2022) 30 tablet 1   protein supplement shake (PREMIER PROTEIN) LIQD Take 2 oz by mouth 4 (four) times daily.     No current facility-administered medications for this visit.    VITAL SIGNS: There were no vitals taken for this visit. There were no vitals filed for this visit.  Estimated body mass index is 18.81 kg/m as calculated from the following:   Height as of 04/02/22: _0  (1.803 m).   Weight as of 12/11/22: 134 lb 14.4 oz (61.2 kg).  LABS: CBC:    Component Value Date/Time   WBC 6.3 12/11/2022 1338   HGB 11.9 (L) 12/11/2022 1338   HCT 36.6 12/11/2022 1338   PLT 274 12/11/2022 1338   MCV 83.8 12/11/2022 1338    NEUTROABS 4.9 12/11/2022 1338   LYMPHSABS 0.7 12/11/2022 1338   MONOABS 0.6 12/11/2022 1338   EOSABS 0.0 12/11/2022 1338   BASOSABS 0.0 12/11/2022 1338   Comprehensive Metabolic Panel:    Component Value Date/Time   NA 135 12/11/2022 1338   K 4.2 12/11/2022 1338   CL 102 12/11/2022 1338   CO2 25 12/11/2022 1338   BUN 14 12/11/2022 1338   CREATININE 0.69 12/11/2022 1338   GLUCOSE 107 (H) 12/11/2022 1338   CALCIUM 8.8 (L) 12/11/2022 1338   AST 21 12/11/2022 1338   ALT 11 12/11/2022 1338   ALKPHOS 62 12/11/2022 1338   BILITOT 0.5 12/11/2022 1338   PROT 7.9 12/11/2022 1338   ALBUMIN 3.5 12/11/2022 1338    RADIOGRAPHIC STUDIES: CT Lumbar Spine W Contrast  Result  Date: 12/06/2022 CLINICAL DATA:  Right flank and leg pain.  History of lung cancer EXAM: CT LUMBAR SPINE WITH CONTRAST TECHNIQUE: Multidetector CT imaging of the lumbar spine was performed with intravenous contrast administration. RADIATION DOSE REDUCTION: This exam was performed according to the departmental dose-optimization program which includes automated exposure control, adjustment of the mA and/or kV according to patient size and/or use of iterative reconstruction technique. CONTRAST:  77m OMNIPAQUE IOHEXOL 350 MG/ML SOLN COMPARISON:  MRI lumbar spine 06/08/2022. CT abdomen pelvis 10/12/2022 FINDINGS: Segmentation: Normal segmentation. Five lumbar vertebra. Lowest disc space L5-S1 Alignment: Mild anterolisthesis L5-S1 otherwise normal alignment Vertebrae: Negative for fracture Numerous sclerotic lesions throughout the lumbar spine as well as the sacrum and iliac bones, stable from the prior CT and MRI. Findings compatible with stable metastatic disease. No new lesions identified. Paraspinal and other soft tissues: Atherosclerotic aorta without aneurysm. No retroperitoneal mass or adenopathy. Subcentimeter left periaortic lymph nodes stable from the prior CT. Disc levels: L1-2: Mild degenerative change.  Negative for stenosis  L2-3: Mild disc bulging and mild facet degeneration. Negative for stenosis L3-4: Diffuse bulging of the disc with borderline spinal stenosis. Subarticular stenosis on the right L4-5: Diffuse bulging of the disc and bilateral facet degeneration. Borderline spinal stenosis. Mild subarticular stenosis bilaterally L5-S1: Disc bulging and facet degeneration. Moderate right foraminal narrowing due to spurring and disc bulging. Mild left foraminal narrowing. IMPRESSION: 1. Numerous sclerotic lesions throughout the lumbar spine and pelvis compatible with metastatic disease. These are stable from the prior CT and MRI. No new lesions. 2. Multilevel degenerative change in the lumbar spine. Borderline spinal stenosis L3-4 and L4-5. 3. Moderate right foraminal narrowing L5-S1 due to spurring and disc bulging. Mild left foraminal narrowing L5-S1. 4. Aortic atherosclerosis. Aortic Atherosclerosis (ICD10-I70.0). Electronically Signed   By: CFranchot GalloM.D.   On: 12/06/2022 12:52   CT FEMUR RIGHT W CONTRAST  Result Date: 12/06/2022 CLINICAL DATA:  Right lower back pain radiating to the right upper leg. EXAM: CT OF THE LOWER RIGHT EXTREMITY WITH CONTRAST TECHNIQUE: Multidetector CT imaging of the lower right extremity was performed according to the standard protocol following intravenous contrast administration. RADIATION DOSE REDUCTION: This exam was performed according to the departmental dose-optimization program which includes automated exposure control, adjustment of the mA and/or kV according to patient size and/or use of iterative reconstruction technique. CONTRAST:  744mOMNIPAQUE IOHEXOL 350 MG/ML SOLN COMPARISON:  None Available. FINDINGS: Bones/Joint/Cartilage There is a lytic lesion with endosteal scalloping in the intertrochanteric region measuring approximately 2.1 x 2.4 x 3.3 cm. Few other sclerotic lesions in the pelvis and visualized femur the largest in the mid to distal femoral diaphysis measuring at least  1.0 x 0.7 x 1.6 cm. Ligaments Suboptimally assessed by CT. Muscles and Tendons Muscles are normal in bulk. No intramuscular hematoma or fluid collection. Soft tissues Skin and subcutaneous soft tissues are within normal limits. IMPRESSION: 1. Large lytic lesion with endosteal scalloping in the intertrochanteric region of the right femur measuring at least 2.1 x 2.4 x 3.3 cm CT examination dated October 12, 2022. There is increased risk for a pathological fracture of the femur. 2. Few other sclerotic lesions in the pelvis and visualized femur. Findings are concerning for metastatic disease. Recommend further evaluation with bone scan. 3. Muscles and subcutaneous soft tissues are within normal limits. Electronically Signed   By: ImKeane Police.O.   On: 12/06/2022 11:06    PERFORMANCE STATUS (ECOG) : 1 - Symptomatic but completely ambulatory  Review of Systems Unless otherwise noted, a complete review of systems is negative.  Physical Exam General: NAD Cardiovascular: regular rate and rhythm Pulmonary: clear ant fields Abdomen: soft, nontender, + bowel sounds GU: no suprapubic tenderness Extremities: no edema, no joint deformities Skin: no rashes Neurological: Weakness but otherwise nonfocal  IMPRESSION: I met with patient and niece today in clinic.  I reintroduced palliative care.  Recent CT of the femur showed enlarging lytic lesion of the right femur with concern for increased risk of pathological fracture.  Patient has had worse bone pain.  She was referred to orthopedics.  Patient reports that she is doing better today after Dr. [name] increased her OxyContin yesterday.  Patient is not currently in pain.  She is taking oxycodone 5 to 10 mg as needed for breakthrough pain (generally several times daily).  Additionally, patient continues to use dexamethasone and has found that has improved her pain as well as appetite.  Dose of gabapentin was also titrated yesterday by Dr. [name].  Patient feels  comfortable with current pain regimen.  She does endorse occasional constipation and we discussed bowel regimen in detail.  She is currently using MiraLAX daily and I advised adding Senokot as needed.  Symptomatically, her only other complaint at present is exertional dyspnea.  However, this is a chronic issue for her and is not acutely worse.  Patient was previously on O2 but has not required O2 recently.  She is also not required thoracentesis recently.  Presumably, shortness of breath is secondary to her cancer +/- COPD.  Patient is utilizing her albuterol inhaler several times daily in addition to her DuoNebs.  She finds that these helped but are short-lived.  Will start her on Advair to provide longer acting control.  We also discussed possible referral to pulmonary if needed.  She has lost follow-up with her previous pulmonologist.  I reviewed with her a MOST form, which patient took him to discuss with family.  Her goals are aligned with continued cancer treatments.  PLAN: -Continue current scope of treatment -Agree with increased dose of OxyContin, oxycodone, and gabapentin -Daily bowel regimen -MOST Form reviewed -Start Advair -Follow-up telephone visit 2 weeks   Patient expressed understanding and was in agreement with this plan. She also understands that She can call the clinic at any time with any questions, concerns, or complaints.     Time Total: 20 minutes  Visit consisted of counseling and education dealing with the complex and emotionally intense issues of symptom management and palliative care in the setting of serious and potentially life-threatening illness.Greater than 50%  of this time was spent counseling and coordinating care related to the above assessment and plan.  Signed by: Altha Harm, PhD, NP-C

## 2022-12-13 ENCOUNTER — Encounter: Payer: Self-pay | Admitting: *Deleted

## 2022-12-13 ENCOUNTER — Telehealth: Payer: Self-pay | Admitting: *Deleted

## 2022-12-13 ENCOUNTER — Other Ambulatory Visit: Payer: Self-pay | Admitting: *Deleted

## 2022-12-13 DIAGNOSIS — J439 Emphysema, unspecified: Secondary | ICD-10-CM

## 2022-12-13 DIAGNOSIS — R0600 Dyspnea, unspecified: Secondary | ICD-10-CM

## 2022-12-13 MED ORDER — FLUTICASONE-SALMETEROL 250-50 MCG/ACT IN AEPB: 1.0000 | INHALATION_SPRAY | Freq: Two times a day (BID) | RESPIRATORY_TRACT | 0 refills | Status: DC

## 2022-12-13 NOTE — Telephone Encounter (Signed)
Brittney Fisher, insurance will not cover the fluticasone. Insurance would be wiling to cover w/o a PA-one of the following meds- advair HFA, Dulera, or flovent HFA. Please advise.

## 2022-12-13 NOTE — Telephone Encounter (Signed)
To initiate an authorization request for this medication, please contact [facility] at [phone].

## 2022-12-13 NOTE — Telephone Encounter (Signed)
New script for advair 250 sent to patient's pharmacy v/o Josh. I spoke with patient and let her know that a new dose of the advair was sent to her pharmacy. Per v/o Josh, I asked pt to use her nebulizer treatments at home to also help with the shortness of breath. She gave verbal understanding of the plan. She thanked me for updating on her.

## 2022-12-13 NOTE — Telephone Encounter (Signed)
Pharmacy PA Call Center: [phone]  Ref id # [id number] ref number. I called [facility] [place] directly and spoke with the PA pharmacist, Butch Penny. I was told the PA was not needed and the advair 250 mcg was approved.

## 2022-12-13 NOTE — Telephone Encounter (Signed)
-----   Message from Secundino Ginger sent at 12/13/2022  8:25 AM EST ----- Regarding: PRIOR AUTHORIZATION NEEDED- [place] for Fluticasone-Salmeterol sent to her chart.

## 2022-12-19 ENCOUNTER — Inpatient Hospital Stay: Payer: Medicaid Other | Admitting: Occupational Therapy

## 2022-12-20 ENCOUNTER — Telehealth: Payer: Self-pay | Admitting: *Deleted

## 2022-12-20 ENCOUNTER — Encounter: Payer: Self-pay | Admitting: Hospice and Palliative Medicine

## 2022-12-20 ENCOUNTER — Encounter: Payer: Medicaid Other | Admitting: Hospice and Palliative Medicine

## 2022-12-20 DIAGNOSIS — C3490 Malignant neoplasm of unspecified part of unspecified bronchus or lung: Secondary | ICD-10-CM

## 2022-12-20 DIAGNOSIS — R0600 Dyspnea, unspecified: Secondary | ICD-10-CM

## 2022-12-20 DIAGNOSIS — J439 Emphysema, unspecified: Secondary | ICD-10-CM

## 2022-12-20 NOTE — Telephone Encounter (Signed)
RN spoke w/HPOC-[name] (pt's niece). Family sent mychart msg to inform office that pt had EMS evaluate her today for shortness of breath. Sats at 84% upon EMS arrival. Oxygen therapy was applied and sats were improved to 98%. Pt decline ems transport to ER. [name] would like pt evaluated in sm[facility] as she has concerns that patient may have a pleural effusion. Pt is using the duoneb and advair inhalers as directed. Patient presently stable in no acute distress. Rosamaria Lints to have patient go to ER if symptoms worsen over night. Pt can be [facility] in [facility] clinic by [facility] tomorrow with labs at 915 am and see Josh at 930 am. Cxr ordered. Pt will obtain cxr tonight.

## 2022-12-21 ENCOUNTER — Other Ambulatory Visit: Payer: Self-pay | Admitting: Hospice and Palliative Medicine

## 2022-12-21 ENCOUNTER — Encounter: Payer: Self-pay | Admitting: Hospice and Palliative Medicine

## 2022-12-21 ENCOUNTER — Ambulatory Visit
Admission: RE | Admit: 2022-12-21 | Discharge: 2022-12-21 | Disposition: A | Discharge status: Home / Self Care | Payer: Medicaid Other | Source: Ambulatory Visit | Attending: Hospice and Palliative Medicine | Admitting: Hospice and Palliative Medicine

## 2022-12-21 ENCOUNTER — Inpatient Hospital Stay (HOSPITAL_BASED_OUTPATIENT_CLINIC_OR_DEPARTMENT_OTHER): Payer: Medicaid Other | Admitting: Hospice and Palliative Medicine

## 2022-12-21 ENCOUNTER — Other Ambulatory Visit: Payer: Self-pay

## 2022-12-21 ENCOUNTER — Other Ambulatory Visit: Payer: Self-pay | Admitting: Student

## 2022-12-21 ENCOUNTER — Ambulatory Visit: Payer: Medicaid Other

## 2022-12-21 ENCOUNTER — Inpatient Hospital Stay: Payer: Medicaid Other

## 2022-12-21 ENCOUNTER — Ambulatory Visit
Admission: RE | Admit: 2022-12-21 | Discharge: 2022-12-21 | Disposition: A | Discharge status: Home / Self Care | Payer: Medicaid Other | Attending: Hospice and Palliative Medicine | Admitting: Hospice and Palliative Medicine

## 2022-12-21 ENCOUNTER — Inpatient Hospital Stay: Payer: Medicaid Other | Attending: Radiation Oncology

## 2022-12-21 VITALS — BP 122/74 | HR 124 | Temp 97.5°F | Ht 71.0 in | Wt 134.0 lb

## 2022-12-21 DIAGNOSIS — Z79899 Other long term (current) drug therapy: Secondary | ICD-10-CM | POA: Diagnosis not present

## 2022-12-21 DIAGNOSIS — J910 Malignant pleural effusion: Secondary | ICD-10-CM

## 2022-12-21 DIAGNOSIS — J439 Emphysema, unspecified: Secondary | ICD-10-CM

## 2022-12-21 DIAGNOSIS — C3411 Malignant neoplasm of upper lobe, right bronchus or lung: Secondary | ICD-10-CM | POA: Diagnosis not present

## 2022-12-21 DIAGNOSIS — G893 Neoplasm related pain (acute) (chronic): Secondary | ICD-10-CM | POA: Diagnosis not present

## 2022-12-21 DIAGNOSIS — Z7952 Long term (current) use of systemic steroids: Secondary | ICD-10-CM | POA: Diagnosis not present

## 2022-12-21 DIAGNOSIS — C7951 Secondary malignant neoplasm of bone: Secondary | ICD-10-CM | POA: Diagnosis present

## 2022-12-21 DIAGNOSIS — Z87891 Personal history of nicotine dependence: Secondary | ICD-10-CM | POA: Diagnosis not present

## 2022-12-21 DIAGNOSIS — R0602 Shortness of breath: Secondary | ICD-10-CM | POA: Diagnosis not present

## 2022-12-21 DIAGNOSIS — Z9889 Other specified postprocedural states: Secondary | ICD-10-CM

## 2022-12-21 DIAGNOSIS — J449 Chronic obstructive pulmonary disease, unspecified: Secondary | ICD-10-CM | POA: Diagnosis not present

## 2022-12-21 DIAGNOSIS — M898X5 Other specified disorders of bone, thigh: Secondary | ICD-10-CM

## 2022-12-21 DIAGNOSIS — C3490 Malignant neoplasm of unspecified part of unspecified bronchus or lung: Secondary | ICD-10-CM

## 2022-12-21 DIAGNOSIS — Z79891 Long term (current) use of opiate analgesic: Secondary | ICD-10-CM | POA: Diagnosis not present

## 2022-12-21 DIAGNOSIS — R0600 Dyspnea, unspecified: Secondary | ICD-10-CM

## 2022-12-21 DIAGNOSIS — C782 Secondary malignant neoplasm of pleura: Secondary | ICD-10-CM | POA: Insufficient documentation

## 2022-12-21 DIAGNOSIS — Z923 Personal history of irradiation: Secondary | ICD-10-CM | POA: Diagnosis not present

## 2022-12-21 LAB — CBC WITH DIFFERENTIAL/PLATELET
Abs Immature Granulocytes: 0.02 10*3/uL (ref 0.00–0.07)
Basophils Absolute: 0 10*3/uL (ref 0.0–0.1)
Basophils Relative: 0 %
Eosinophils Absolute: 0.1 10*3/uL (ref 0.0–0.5)
Eosinophils Relative: 1 %
HCT: 36.5 % (ref 36.0–46.0)
Hemoglobin: 11.6 g/dL — ABNORMAL LOW (ref 12.0–15.0)
Immature Granulocytes: 0 %
Lymphocytes Relative: 13 %
Lymphs Abs: 0.8 10*3/uL (ref 0.7–4.0)
MCH: 27.1 pg (ref 26.0–34.0)
MCHC: 31.8 g/dL (ref 30.0–36.0)
MCV: 85.3 fL (ref 80.0–100.0)
Monocytes Absolute: 0.6 10*3/uL (ref 0.1–1.0)
Monocytes Relative: 10 %
Neutro Abs: 4.6 10*3/uL (ref 1.7–7.7)
Neutrophils Relative %: 76 %
Platelets: 221 10*3/uL (ref 150–400)
RBC: 4.28 MIL/uL (ref 3.87–5.11)
RDW: 15.7 % — ABNORMAL HIGH (ref 11.5–15.5)
WBC: 6.1 10*3/uL (ref 4.0–10.5)
nRBC: 0 % (ref 0.0–0.2)

## 2022-12-21 LAB — COMPREHENSIVE METABOLIC PANEL
ALT: 11 U/L (ref 0–44)
AST: 18 U/L (ref 15–41)
Albumin: 3.3 g/dL — ABNORMAL LOW (ref 3.5–5.0)
Alkaline Phosphatase: 60 U/L (ref 38–126)
Anion gap: 10 (ref 5–15)
BUN: 13 mg/dL (ref 8–23)
CO2: 28 mmol/L (ref 22–32)
Calcium: 8.9 mg/dL (ref 8.9–10.3)
Chloride: 99 mmol/L (ref 98–111)
Creatinine, Ser: 0.6 mg/dL (ref 0.44–1.00)
GFR, Estimated: >60 mL/min (ref >60)
Glucose, Bld: 100 mg/dL — ABNORMAL HIGH (ref 70–99)
Potassium: 3.6 mmol/L (ref 3.5–5.1)
Sodium: 137 mmol/L (ref 135–145)
Total Bilirubin: 0.4 mg/dL (ref 0.3–1.2)
Total Protein: 7.8 g/dL (ref 6.5–8.1)

## 2022-12-21 MED ORDER — AMOXICILLIN-POT CLAVULANATE 875-125 MG PO TABS: 1.0000 | ORAL_TABLET | Freq: Two times a day (BID) | ORAL | 0 refills | Status: DC

## 2022-12-21 NOTE — Progress Notes (Addendum)
Symptom Management [place] at [facility] Telephone:(336) [phone] [phone]  Patient Care Team: Pcp, No as PCP - General Telford Nab, RN as Oncology Nurse Navigator [name], MD as Medical Oncologist (Oncology)   NAME OF PATIENT: Brittney Fisher  [id number]  1961/06/16   DATE OF VISIT: 12/21/22  REASON FOR CONSULT: Brittney Fisher is a 62 y.o. 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   INTERVAL HISTORY: Family reportedly called EMS yesterday due to hypoxia and shortness of breath.  However, patient refused transport to the ED.  She was seen today in [facility] for evaluation.   Denies any neurologic complaints. Denies recent fevers or illnesses. Denies any easy bleeding or bruising. Reports good appetite and denies weight loss. Denies chest pain. Denies any nausea, vomiting, constipation, or diarrhea. Denies urinary complaints. Patient offers no further specific complaints today.   PAST MEDICAL HISTORY: Past Medical History:  Diagnosis Date   COPD (chronic obstructive pulmonary disease) (HCC)    GERD (gastroesophageal reflux disease)    Metastatic lung cancer (metastasis from lung to other site) ([place]) 11/02/2020    PAST SURGICAL HISTORY:  Past Surgical History:  Procedure Laterality Date   COLONOSCOPY WITH ESOPHAGOGASTRODUODENOSCOPY (EGD)     COLONOSCOPY WITH PROPOFOL N/A 11/14/2015   Procedure: COLONOSCOPY WITH PROPOFOL;  Surgeon: Hulen Luster, MD;  Location: [facility] ENDOSCOPY;  Service: Gastroenterology;  Laterality: N/A;   ESOPHAGOGASTRODUODENOSCOPY (EGD) WITH PROPOFOL N/A 11/14/2015   Procedure: ESOPHAGOGASTRODUODENOSCOPY (EGD) WITH PROPOFOL;  Surgeon: Hulen Luster, MD;  Location: [facility] ENDOSCOPY;  Service: Gastroenterology;  Laterality: N/A;    HEMATOLOGY/ONCOLOGY HISTORY:  Oncology History   Metastatic lung cancer (metastasis from lung to other site) ([place])  11/02/2020 Initial Diagnosis   Metastatic lung cancer (metastasis from lung to other site) [facility])   11/18/2020 - 09/29/2021 Chemotherapy   Patient is on Treatment Plan : LUNG PEMEtrexed (Alimta) + CARBOplatin + Bevacizumab q21d x 1 cycle      03/20/2022 Imaging   CT chest abdomen pelvis w contrast 1. Unchanged circumferential pleural thickening and pleural nodularity of the right hemithorax, left infrahilar mass, and bilateral pulmonary nodules. 2. Stable right perihilar consolidation, likely post radiation changes. 3. Unchanged numerous sclerotic osseous lesions.4.  Aortic Atherosclerosis (ICD10-I70.0).    06/08/2022 Imaging   MRI lumbar spine w and wo 1. Unchanged appearance of multiple sclerotic lesions involving all lumbar levels, consistent with osseous metastatic disease.2. Severe right L5-S1 neural foraminal stenosis.3. Mild right L4-5 neural foraminal stenosis  MR Sacrum SI Joints w wo contrast 1. Multiple sclerotic lesions of sacrum and bilateral ilia with enhancement on post contrast sequences consistent with metastatic disease.2.  No evidence of sacroiliitis or joint effusion. 3.  No significant finding of the neurovascular bundle bilaterally     Imaging     Primary malignant neoplasm of lung metastatic to other site [facility])  11/04/2020 Initial Diagnosis   Primary malignant neoplasm of lung metastatic to other site [facility])  10/20/2020, patient was brought to ED via EMS due to generalized weakness, dizziness, chest pain shortness of breath.  She was recently diagnosed with COPD approximately 1 month ago by primary care provider.  Also unintentional weight loss during the last few months. Image work-up showed right-sided pleural effusion.  She underwent right thoracentesis.  With removal of 2.4 L of fluid. 10/18/2020, CT chest with contrast showed central right upper  lobe pulmonary bronchogenic carcinoma with direct  mediastinal invasion.  Metastatic disease to low cervical/thoracic nodes, lung, right pleural space and bones.  Moderate to large right pleural effusion.  SVC narrowing. 10/21/2020 MRI brain is negative for metastasis.  Mild chronic microvascular ischemic changes in the white matter.  Negative for acute infarct Regarding to the SVC narrowing, patient was seen by vascular surgeon and was recommended no intervention inpatient.  Patient to follow-up outpatient with vascular surgeon for evaluation. Patient also was treated for COPD exacerbation with hypoxia on exertion. Qualifies for home oxygen and hospitalist arrange home health and a nebulizer.  Patient was given a course of prednisone taper and empiric Levaquin. Patient was discharged and present today to follow-up with cytology results and further management plan   10/27/2020 PET scan showed right upper lobe primary bronchogenic mass with direct invasion into mediastinum. Metastatic disease to the right pleural space, lung, bone, nodes of the chest and less so lower leg/upper abdomen. Hypermetabolic him along the course of the right axillary vein with concurrent subtle hyper attenuation within the axillary vein and SVC.  This continues to the level of SVC narrowing.  Suspicious for SVC occlusion and developing thrombus. Moderate right pleural effusion and small pericardial effusion.   # # left supraclavicular mass biopsy- pathology is positive for adenocarcinoma. positive for CK7, with patchy, weak CK20.  They are  negative for TTF1, NapsinA, GATA3, CDX2, and Pax8.  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  #NGS showed AXIN 1 [id number], DIS2 M158fs, KRAS G12C, PRDM1 M1 [id number]*, [id number], MS stable, TPS <1%      11/04/2020 Cancer Staging   Staging  form: Lung, AJCC 8th Edition - Clinical stage from 11/04/2020: Stage IV (cT4, cN3, cM1) - Signed by [name], MD on 11/04/2020   11/14/2020 - 12/12/2020 Radiation Therapy   #Bronchial obstruction and SVC occlusion.  s/p palliative radiation   11/18/2020 - 03/22/2022 Chemotherapy   carboplatin/Alimta/bevacizumab x 6 cycles   Infusion reaction of carboplatin during cycle 6.  Carboplatin discontinued.   04/07/2021 Imaging   CT chest abdomen pelvis-partial response Mild decrease in size of right lung mass, mildly decreased mediastinal and hilar adenopathy.  Extensive right-sided pleural metastasis unchanged.  Similar appearance of diffuse bilateral pulmonary nodules.  Multifocal lytic sclerotic bone metastasis.  New superior endplate deformity of T6   04/12/2021 - 09/29/2021 Chemotherapy   04/12/2021 - 09/08/2021 bevacizumab and Alimta.  09/29/2021 extra cycle of maintenance bevacizumab and Alimta while waiting for approval of Lumakras   09/21/2021 Imaging    bone scan showed multifocal abnormal uptake including mid to distal shaft of right femur, left femoral neck and trochanter, left sacroiliac region, left ischium.  Focal activity at the proximal tibia on the left.  Multi focal heterogeneous bilateral rib activity.  Focal activity at the sternum.   09/27/2021 Imaging   CT chest abdomen pelvis with contrast showed progression of infrahilar left lower lobe with a concerning clearly progressive masslike consolidation opacity.  Mild progression of small left pleural effusion.  Similar experience of extensive irregular right-sided pleural disease.  Similar experience of bony metastasis.    10/20/2021 -  Chemotherapy   Started on Lumakras   01/09/2022 Imaging   CT chest abdomen pelvis with contrast showed no substantial interval changes in exam.  Stable disease   03/20/2022 -  Chemotherapy   CT chest abdomen pelvis with contrast showed unchanged circumferential pleural thickening/pleural nodularity of  the right hemithorax, left infrahilar mass and bilateral pulmonary nodules.  Stable right perihilar consolidation likely postradiation changes.  Unchanged numerous sclerotic osseous lesions.  Aortic atherosclerosis.     06/08/2022 Imaging   MRI sacrum SI joint w wo contrast Showed 1. Multiple sclerotic lesions of sacrum and bilateral ilia with enhancement on post contrast sequences consistent with metastatic disease. 2.  No evidence of sacroiliitis or joint effusion.3.  No significant finding of the neurovascular bundle bilaterally    06/08/2022 Imaging   MRI lumbar spine w wo contrast 1. Unchanged appearance of multiple sclerotic lesions involving all lumbar levels, consistent with osseous metastatic disease.2. Severe right L5-S1 neural foraminal stenosis.3. Mild right L4-5 neural foraminal stenosis    07/26/2022 Imaging   CT chest abdomen pelvis with contrast 1. Redemonstrated post treatment appearance of the chest with masslike consolidation and fibrosis of the perihilar right lung , mass of the superior segment left lower lobe, as well as extensive bilateral pleural thickening and nodularity, greater on the right, and numerous bilateral pulmonary nodules. 2. Increased nodularity about the right lung base, consistent with worsened pleural metastatic disease. Other nodules unchanged. 3. Unchanged post treatment appearance of mediastinal and right hilar lymph nodes as well as epicardial lymph nodes about the left ventricular apex. 4. Slightly enlarged lytic osseous metastatic lesion of the base of the right femoral neck measuring 2.4 x 2.2 cm, previously 2.2 x 1.8 cm. This is potentially at risk for pathologic fracture given location.5. Otherwise unchanged widespread sclerotic osseous metastatic disease throughout the axial and proximal appendicular skeleton. 6. No evidence of soft tissue metastatic disease to the abdomen or pelvis. 6. No evidence of soft tissue metastatic disease to the abdomen or  pelvis.     ALLERGIES:  is allergic to carboplatin.  MEDICATIONS:  Current Outpatient Medications  Medication Sig Dispense Refill   acetaminophen (TYLENOL) 500 MG tablet Take 1,000 mg by mouth every 8 (eight) hours as needed for moderate pain.      albuterol (VENTOLIN HFA) 108 (90 Base) MCG/ACT inhaler Inhale 2 puffs into the lungs every 6 (six) hours as needed for wheezing or shortness of breath. 8 g 2   ALPRAZolam (XANAX) 0.5 MG tablet Take 1 tablet (0.5 mg total) by mouth See admin instructions. Take 1 tablet 60 minutes before procedure; if needed due to incomplete response and/or duration of procedure, may repeat another 1 tablet after 30 minutes. (Patient not taking: Reported on 12/05/2022) 2 tablet 0   benzonatate (TESSALON) 100 MG capsule Take 1 capsule by mouth three times daily as needed for cough 60 capsule 0   calcium carbonate (TUMS - DOSED IN MG ELEMENTAL CALCIUM) 500 MG chewable tablet Chew 1 tablet by mouth as needed.     dexamethasone (DECADRON) 4 MG tablet Take 1 tablet (4 mg total) by mouth daily. 30 tablet 0   fluticasone-salmeterol (ADVAIR DISKUS) 250-50 MCG/ACT AEPB Inhale 1 puff into the lungs in the morning and at bedtime. 60 each 0   folic acid (FOLVITE) 1 MG tablet Take 1 tablet (1 mg total) by mouth daily. Start 5-7 days before Alimta chemotherapy. Continue until 21 days after Alimta completed. 100 tablet 3   gabapentin (NEURONTIN) 100 MG capsule Take 1 capsule (100 mg total) by mouth 3 (three) times daily. 90 capsule 0   guaiFENesin (MUCINEX) 600 MG 12 hr tablet Take 1 tablet (600 mg total) by mouth  2 (two) times daily. 14 tablet 0   ibuprofen (ADVIL) 800 MG tablet Take 1 tablet (800 mg total) by mouth daily. 30 tablet 0   ipratropium-albuterol (DUONEB) 0.5-2.5 (3) MG/3ML SOLN Take 3 mLs by nebulization 3 (three) times daily. 360 mL 1   LUMAKRAS 120 MG tablet TAKE 8 TABLETS BY MOUTH DAILY 240 tablet 2   magic mouthwash w/lidocaine SOLN Take 5 mLs by mouth 4 (four)  times daily as needed for mouth pain. Sig: Swish/Swallow 5-10 ml four times a day as needed. Dispense 480 ml. 1RF (Patient not taking: Reported on 06/21/2022) 480 mL 1   naloxone (NARCAN) nasal spray 4 mg/0.1 mL For suspected overdose of narcotics. (Patient not taking: Reported on 12/11/2022) 1 each 0   ondansetron (ZOFRAN) 8 MG tablet Take 1 tablet (8 mg total) by mouth 2 (two) times daily as needed for refractory nausea / vomiting. Start on day 3 after chemo. (Patient not taking: Reported on 06/21/2022) 30 tablet 1   oxyCODONE (OXY IR/ROXICODONE) 5 MG immediate release tablet Take 1-2 tablets (5-10 mg total) by mouth every 4 (four) hours as needed for moderate pain or severe pain. 120 tablet 0   oxyCODONE (OXYCONTIN) 15 mg 12 hr tablet Take 1 tablet (15 mg total) by mouth every 12 (twelve) hours. 60 tablet 0   oxyCODONE (OXYCONTIN) 20 mg 12 hr tablet Take 1 tablet (20 mg total) by mouth every 12 (twelve) hours. (Patient not taking: Reported on 12/11/2022) 90 tablet 0   phenazopyridine (PYRIDIUM) 95 MG tablet Take 2 tablets (190 mg total) by mouth 3 (three) times daily as needed for pain. 6 tablet 0   Polyethylene Glycol 3350 (MIRALAX PO) Take by mouth daily as needed.     prochlorperazine (COMPAZINE) 10 MG tablet Take 1 tablet (10 mg total) by mouth every 6 (six) hours as needed (Nausea or vomiting). (Patient not taking: Reported on 06/21/2022) 30 tablet 1   protein supplement shake (PREMIER PROTEIN) LIQD Take 2 oz by mouth 4 (four) times daily.     No current facility-administered medications for this visit.    VITAL SIGNS: There were no vitals taken for this visit. There were no vitals filed for this visit.  Estimated body mass index is 18.81 kg/m as calculated from the following:   Height as of 12/12/22: 5\' 11"  (1.803 m).   Weight as of 12/11/22: 134 lb 14.4 oz (61.2 kg).  LABS: CBC:    Component Value Date/Time   WBC 6.1 12/21/2022 0906   HGB 11.6 (L) 12/21/2022 0906   HCT 36.5 12/21/2022  0906   PLT 221 12/21/2022 0906   MCV 85.3 12/21/2022 0906   NEUTROABS 4.6 12/21/2022 0906   LYMPHSABS 0.8 12/21/2022 0906   MONOABS 0.6 12/21/2022 0906   EOSABS 0.1 12/21/2022 0906   BASOSABS 0.0 12/21/2022 0906   Comprehensive Metabolic Panel:    Component Value Date/Time   NA 135 12/11/2022 1338   K 4.2 12/11/2022 1338   CL 102 12/11/2022 1338   CO2 25 12/11/2022 1338   BUN 14 12/11/2022 1338   CREATININE 0.69 12/11/2022 1338   GLUCOSE 107 (H) 12/11/2022 1338   CALCIUM 8.8 (L) 12/11/2022 1338   AST 21 12/11/2022 1338   ALT 11 12/11/2022 1338   ALKPHOS 62 12/11/2022 1338   BILITOT 0.5 12/11/2022 1338   PROT 7.9 12/11/2022 1338   ALBUMIN 3.5 12/11/2022 1338    RADIOGRAPHIC STUDIES: DG Chest 2 View  Result Date: 12/21/2022 CLINICAL DATA:  History of  lung cancer with several week history of shortness of breath EXAM: CHEST - 2 VIEW COMPARISON:  CT chest dated 10/12/2022 chest radiograph dated 12/30/2020 FINDINGS: Slightly low lung volumes. Persistent right apical opacity and asymmetric patchy right lung opacities. Multifocal right lung nodules are better evaluated on prior chest CT. New peripheral left upper lung nodular opacity. Small to moderate right pleural effusion. Trace left pleural effusion. The heart size and mediastinal contours are within normal limits. The visualized skeletal structures are unremarkable. IMPRESSION: 1. Persistent right apical opacity and asymmetric patchy right lung opacities. Multifocal right lung nodules are better evaluated on prior chest CT. 2. New peripheral left upper lung nodular opacity may represent a focus of infection or metastasis. 3. Small to moderate right pleural effusion. Trace left pleural effusion. Electronically Signed   By: Darrin Nipper M.D.   On: 12/21/2022 09:03   CT Lumbar Spine W Contrast  Result Date: 12/06/2022 CLINICAL DATA:  Right flank and leg pain.  History of lung cancer EXAM: CT LUMBAR SPINE WITH CONTRAST TECHNIQUE:  Multidetector CT imaging of the lumbar spine was performed with intravenous contrast administration. RADIATION DOSE REDUCTION: This exam was performed according to the departmental dose-optimization program which includes automated exposure control, adjustment of the mA and/or kV according to patient size and/or use of iterative reconstruction technique. CONTRAST:  21mL OMNIPAQUE IOHEXOL 350 MG/ML SOLN COMPARISON:  MRI lumbar spine 06/08/2022. CT abdomen pelvis 10/12/2022 FINDINGS: Segmentation: Normal segmentation. Five lumbar vertebra. Lowest disc space L5-S1 Alignment: Mild anterolisthesis L5-S1 otherwise normal alignment Vertebrae: Negative for fracture Numerous sclerotic lesions throughout the lumbar spine as well as the sacrum and iliac bones, stable from the prior CT and MRI. Findings compatible with stable metastatic disease. No new lesions identified. Paraspinal and other soft tissues: Atherosclerotic aorta without aneurysm. No retroperitoneal mass or adenopathy. Subcentimeter left periaortic lymph nodes stable from the prior CT. Disc levels: L1-2: Mild degenerative change.  Negative for stenosis L2-3: Mild disc bulging and mild facet degeneration. Negative for stenosis L3-4: Diffuse bulging of the disc with borderline spinal stenosis. Subarticular stenosis on the right L4-5: Diffuse bulging of the disc and bilateral facet degeneration. Borderline spinal stenosis. Mild subarticular stenosis bilaterally L5-S1: Disc bulging and facet degeneration. Moderate right foraminal narrowing due to spurring and disc bulging. Mild left foraminal narrowing. IMPRESSION: 1. Numerous sclerotic lesions throughout the lumbar spine and pelvis compatible with metastatic disease. These are stable from the prior CT and MRI. No new lesions. 2. Multilevel degenerative change in the lumbar spine. Borderline spinal stenosis L3-4 and L4-5. 3. Moderate right foraminal narrowing L5-S1 due to spurring and disc bulging. Mild left  foraminal narrowing L5-S1. 4. Aortic atherosclerosis. Aortic Atherosclerosis (ICD10-I70.0). Electronically Signed   By: Franchot Gallo M.D.   On: 12/06/2022 12:52   CT FEMUR RIGHT W CONTRAST  Result Date: 12/06/2022 CLINICAL DATA:  Right lower back pain radiating to the right upper leg. EXAM: CT OF THE LOWER RIGHT EXTREMITY WITH CONTRAST TECHNIQUE: Multidetector CT imaging of the lower right extremity was performed according to the standard protocol following intravenous contrast administration. RADIATION DOSE REDUCTION: This exam was performed according to the departmental dose-optimization program which includes automated exposure control, adjustment of the mA and/or kV according to patient size and/or use of iterative reconstruction technique. CONTRAST:  49mL OMNIPAQUE IOHEXOL 350 MG/ML SOLN COMPARISON:  None Available. FINDINGS: Bones/Joint/Cartilage There is a lytic lesion with endosteal scalloping in the intertrochanteric region measuring approximately 2.1 x 2.4 x 3.3 cm. Few other sclerotic lesions  in the pelvis and visualized femur the largest in the mid to distal femoral diaphysis measuring at least 1.0 x 0.7 x 1.6 cm. Ligaments Suboptimally assessed by CT. Muscles and Tendons Muscles are normal in bulk. No intramuscular hematoma or fluid collection. Soft tissues Skin and subcutaneous soft tissues are within normal limits. IMPRESSION: 1. Large lytic lesion with endosteal scalloping in the intertrochanteric region of the right femur measuring at least 2.1 x 2.4 x 3.3 cm CT examination dated October 12, 2022. There is increased risk for a pathological fracture of the femur. 2. Few other sclerotic lesions in the pelvis and visualized femur. Findings are concerning for metastatic disease. Recommend further evaluation with bone scan. 3. Muscles and subcutaneous soft tissues are within normal limits. Electronically Signed   By: Keane Police D.O.   On: 12/06/2022 11:06    PERFORMANCE STATUS (ECOG) : 1 -  Symptomatic but completely ambulatory  Review of Systems Unless otherwise noted, a complete review of systems is negative.  Physical Exam General: NAD Cardiovascular: regular rate and rhythm Pulmonary: some wheezing, diminished L side Abdomen: soft, nontender, + bowel sounds GU: no suprapubic tenderness Extremities: no edema, no joint deformities Skin: no rashes Neurological: Weakness but otherwise nonfocal  IMPRESSION/PLAN: Shortness of breath -likely multifactorial but suspect disease progression.  Chest x-ray done this morning shows persistent right apical opacity and asymmetric patchy right lung opacities.  There is a new peripheral left upper lung nodular opacity concerning for possible infection versus metastasis.  Additionally, patient has small to moderate right pleural effusion.  Will proceed with therapeutic thoracentesis.  Patient is pending CT of the chest, abdomen, and pelvis in 2 weeks.  Will see if we can go ahead and move up the scheduling of this.  Will start her empirically on antibiotics, although symptoms less suspicious for infection and probably more reflection of her disease process.  Continue steroids and inhalers/nebulizers.  Of note, patient has not had O2 since 2021 after change in insurance. Two minutes of ambulation today without note of significant hypoxia.  SaO2 dropped to low of 94% on room air.  Will order overnight pulse oximetry.  Patient pending pulmonary consultation in February.   Case and plan discussed with Dr. [name]  Patient expressed understanding and was in agreement with this plan. She also understands that She can call clinic at any time with any questions, concerns, or complaints.   Thank you for allowing me to participate in the care of this very pleasant patient.   Time Total: 20 minutes  Visit consisted of counseling and education dealing with the complex and emotionally intense issues of symptom management in the setting of serious  illness.Greater than 50%  of this time was spent counseling and coordinating care related to the above assessment and plan.  Signed by: Altha Harm, PhD, NP-C

## 2022-12-21 NOTE — Progress Notes (Signed)
Patient presented to the [facility] [place] department today for thoracentesis evaluation. Limited ultrasound examination of the right lung fields revealed scant pleural fluid. No procedure performed. Images saved in Epic. Dr. Denna Haggard made aware.  Soyla Dryer, [facility] [phone] 12/21/2022, 11:31 AM

## 2022-12-21 NOTE — Addendum Note (Signed)
Addended by: Altha Harm R on: 12/21/2022 10:45 AM   Modules accepted: Orders

## 2022-12-24 ENCOUNTER — Ambulatory Visit
Admission: RE | Admit: 2022-12-24 | Discharge: 2022-12-24 | Disposition: A | Discharge status: Home / Self Care | Payer: Medicaid Other | Source: Ambulatory Visit | Attending: Hospice and Palliative Medicine | Admitting: Hospice and Palliative Medicine

## 2022-12-24 DIAGNOSIS — C3490 Malignant neoplasm of unspecified part of unspecified bronchus or lung: Secondary | ICD-10-CM | POA: Insufficient documentation

## 2022-12-24 MED ORDER — IOHEXOL 300 MG/ML  SOLN
85.0000 mL | Freq: Once | INTRAMUSCULAR | Status: AC | PRN
  Administered 2022-12-24: 85 mL via INTRAVENOUS

## 2022-12-26 ENCOUNTER — Other Ambulatory Visit: Payer: Self-pay | Admitting: Oncology

## 2022-12-26 ENCOUNTER — Inpatient Hospital Stay (HOSPITAL_BASED_OUTPATIENT_CLINIC_OR_DEPARTMENT_OTHER): Payer: Medicaid Other | Admitting: Hospice and Palliative Medicine

## 2022-12-26 DIAGNOSIS — Z515 Encounter for palliative care: Secondary | ICD-10-CM

## 2022-12-26 DIAGNOSIS — C3490 Malignant neoplasm of unspecified part of unspecified bronchus or lung: Secondary | ICD-10-CM

## 2022-12-26 DIAGNOSIS — M898X5 Other specified disorders of bone, thigh: Secondary | ICD-10-CM

## 2022-12-26 NOTE — Progress Notes (Signed)
Virtual Visit via Telephone Note  I connected with Brittney Fisher on 12/26/22 at  1:40 PM EST by telephone and verified that I am speaking with the correct person using two identifiers.  Location: Patient: Home Provider: Clinic   I discussed the limitations, risks, security and privacy concerns of performing an evaluation and management service by telephone and the availability of in person appointments. I also discussed with the patient that there may be a patient responsible charge related to this service. The patient expressed understanding and agreed to proceed.   History of Present Illness: Brittney Fisher is a 62 y.o. 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    Observations/Objective: CT of the chest, abdomen, and pelvis on 12/24/2022 shows bilateral peripheral pulm nodules mildly increased in size with bilateral small pleural effusions.  Patient has stable sclerotic metastasis in spine and pelvis with a lytic lesion in the right femoral neck concerning for possible impending pathologic fracture.  Patient was referred to [facility] for consideration of pinning.   Spoke with patient by phone.  She reports that she is "fine today" and denies any significant changes or concerns.  No new symptomatic complaints.  She was seen by Ortho - Dr. Harlow Mares but no definitive plan yet.  Assessment and Plan: Stage IV adenocarcinoma of the lung -CT concerning for mild interval progression size of pulmonary nodules.  This likely explains patient's recent shortness of breath.  Patient has upcoming follow-up with Dr. [name] to discuss treatment options.  Impending pathologic fracture of right femur -she has been evaluated by Ortho with consideration for pinning  Neoplasm related pain -continue OxyContin/oxycodone  Follow Up Instructions: Follow-up  telephone visit 1 month   I discussed the assessment and treatment plan with the patient. The patient was provided an opportunity to ask questions and all were answered. The patient agreed with the plan and demonstrated an understanding of the instructions.   The patient was advised to call back or seek an in-person evaluation if the symptoms worsen or if the condition fails to improve as anticipated.  I provided 5 minutes of non-face-to-face time during this encounter.   Brittney Hong, NP

## 2022-12-27 ENCOUNTER — Other Ambulatory Visit: Payer: Self-pay | Admitting: Orthopedic Surgery

## 2022-12-27 DIAGNOSIS — Z01818 Encounter for other preprocedural examination: Secondary | ICD-10-CM

## 2022-12-27 NOTE — Progress Notes (Signed)
NAME: Brittney Fisher MRN:   [id number] DOB:   04-20-1961     HISTORY AND PHYSICAL  CHIEF COMPLAINT:  right femur pain  HISTORY:   Brittney Fisher a 62 y.o. female  with right  leg pain.  The patient has stage 4 lung cancer and developed an unstable lytic lesion in the left femur.   Plan for Left IM nail.  PAST MEDICAL HISTORY:   Past Medical History:  Diagnosis Date   COPD (chronic obstructive pulmonary disease) (HCC)    GERD (gastroesophageal reflux disease)    Metastatic lung cancer (metastasis from lung to other site) ([place]) 11/02/2020    PAST SURGICAL HISTORY:   Past Surgical History:  Procedure Laterality Date   COLONOSCOPY WITH ESOPHAGOGASTRODUODENOSCOPY (EGD)     COLONOSCOPY WITH PROPOFOL N/A 11/14/2015   Procedure: COLONOSCOPY WITH PROPOFOL;  Surgeon: Hulen Luster, MD;  Location: [facility] ENDOSCOPY;  Service: Gastroenterology;  Laterality: N/A;   ESOPHAGOGASTRODUODENOSCOPY (EGD) WITH PROPOFOL N/A 11/14/2015   Procedure: ESOPHAGOGASTRODUODENOSCOPY (EGD) WITH PROPOFOL;  Surgeon: Hulen Luster, MD;  Location: [facility] ENDOSCOPY;  Service: Gastroenterology;  Laterality: N/A;    MEDICATIONS:  (Not in a hospital admission)   ALLERGIES:   Allergies  Allergen Reactions   Carboplatin Shortness Of Breath, Itching and Cough    REVIEW OF SYSTEMS:   Negative except HPI  FAMILY HISTORY:   Family History  Problem Relation Age of Onset   Breast cancer Cousin 66   Diabetes type II Mother    Hypertension Mother    Colon cancer Father    Hypertension Father     SOCIAL HISTORY:   reports that she quit smoking about 15 months ago. Her smoking use included cigarettes. She has never used smokeless tobacco. She reports that she does not drink alcohol and does not use drugs.  PHYSICAL EXAM:  General appearance: alert, cooperative, and no distress Neck: no JVD and supple, symmetrical, trachea midline Resp: clear to auscultation bilaterally Cardio: regular rate and rhythm, S1, S2 normal, no  murmur, click, rub or gallop GI: soft, non-tender; bowel sounds normal; no masses,  no organomegaly Extremities: extremities normal, atraumatic, no cyanosis or edema and Homans sign is negative, no sign of DVT Pulses: 2+ and symmetric    LABORATORY STUDIES: No results for input(s): "WBC", "HGB", "HCT", "PLT" in the last 72 hours.  No results for input(s): "NA", "K", "CL", "CO2", "GLUCOSE", "BUN", "CREATININE", "CALCIUM" in the last 72 hours.  STUDIES/RESULTS:  CT CHEST ABDOMEN PELVIS W CONTRAST  Result Date: 12/24/2022 CLINICAL DATA:  Non-small cell lung cancer. Assess treatment response. Initial diagnosis November 2021. Chemotherapy radiation therapy. Immunotherapy completed. * Tracking Code: BO * EXAM: CT CHEST, ABDOMEN, AND PELVIS WITH CONTRAST TECHNIQUE: Multidetector CT imaging of the chest, abdomen and pelvis was performed following the standard protocol during bolus administration of intravenous contrast. RADIATION DOSE REDUCTION: This exam was performed according to the departmental dose-optimization program which includes automated exposure control, adjustment of the mA and/or kV according to patient size and/or use of iterative reconstruction technique. CONTRAST:  67mL OMNIPAQUE IOHEXOL 300 MG/ML  SOLN COMPARISON:  03/20/2022 FINDINGS: CT CHEST FINDINGS Cardiovascular: No significant vascular findings. Normal heart size. No pericardial effusion. Mediastinum/Nodes: Ill-defined tissue planes within the mediastinum consistent with tumor infiltrate. No change from comparison exam. No axillary supraclavicular adenopathy Lungs/Pleura: Peripheral nodules in the RIGHT lower lobe and along the RIGHT lower lobe pleural surface. For example lateral nodule in the RIGHT lower lobe along the pleural surface measures 16 mm (  image 86/3 compared to 12 mm. Nodule over the RIGHT hemidiaphragm measuring 13 mm (image 104/3) compares to 11 mm. Nodule in the RIGHT middle lobe pleural surface measures 11 mm (97/3  compared to 11 mm. There is bronchiectasis and peribronchial thickening in the RIGHT lobe similar prior. Several new peripheral nodules in the LEFT lung. For example LEFT upper lobe nodule measuring 10 mm (image 91/3) compares to 6 mm. Consolidation in the LEFT lower lobe is similar prior. RIGHT upper lobe nodule measuring 6 mm compares to 5 (image 46/3 Musculoskeletal: Stable sclerotic lesions in the thoracic spine CT ABDOMEN AND PELVIS FINDINGS Hepatobiliary: No focal hepatic lesion. No biliary ductal dilatation. Gallbladder is normal. Common bile duct is normal. Pancreas: Pancreas is normal. No ductal dilatation. No pancreatic inflammation. Spleen: Normal spleen Adrenals/urinary tract: Adrenal glands and kidneys are normal. The ureters and bladder normal. Stomach/Bowel: Stomach, small bowel, appendix, and cecum are normal. The colon and rectosigmoid colon are normal. Vascular/Lymphatic: Abdominal aorta is normal caliber with atherosclerotic calcification. There is no retroperitoneal or periportal lymphadenopathy. No pelvic lymphadenopathy. Reproductive: Uterus and adnexa unremarkable. Other: No peritoneal metastasis Musculoskeletal: Multiple round sclerotic lesions in the proximal femurs, iliac bones, sacrum and lower thoracic spine are unchanged. Lytic lesion within the RIGHT femoral neck measures 2.7 cm unchanged IMPRESSION: CHEST IMPRESSION: 1. Bilateral peripheral pleural nodules are mildly increased in size. 2. Bilateral small effusions. Stable consolidation at the LEFT lung base 3. Stable ill-defined tissue in the mediastinum suggesting treated tumor infiltrate. PELVIS IMPRESSION: 1. No evidence of metastatic disease in the abdomen pelvis. 2. Stable sclerotic metastasis in the spine and pelvis. 3. Stable lytic lesion in the RIGHT femoral neck may be at risk for pathologic fracture. 4.  Aortic Atherosclerosis (ICD10-I70.0). Electronically Signed   By: Suzy Bouchard M.D.   On: 12/24/2022 14:06   [place] CHEST  (PLEURAL EFFUSION)  Result Date: 12/21/2022 CLINICAL DATA:  Pleural effusion EXAM: CHEST ULTRASOUND COMPARISON:  None Available. FINDINGS: Focused sonographic exam of the bilateral chest was performed for fluid assessment. Images demonstrate no significant pleural effusion on either the right or left side. No thoracentesis performed. IMPRESSION: No significant pleural effusion appreciated by ultrasound on either side. No thoracentesis performed. Electronically Signed   By: Albin Felling M.D.   On: 12/21/2022 12:32   DG Chest 2 View  Result Date: 12/21/2022 CLINICAL DATA:  History of lung cancer with several week history of shortness of breath EXAM: CHEST - 2 VIEW COMPARISON:  CT chest dated 10/12/2022 chest radiograph dated 12/30/2020 FINDINGS: Slightly low lung volumes. Persistent right apical opacity and asymmetric patchy right lung opacities. Multifocal right lung nodules are better evaluated on prior chest CT. New peripheral left upper lung nodular opacity. Small to moderate right pleural effusion. Trace left pleural effusion. The heart size and mediastinal contours are within normal limits. The visualized skeletal structures are unremarkable. IMPRESSION: 1. Persistent right apical opacity and asymmetric patchy right lung opacities. Multifocal right lung nodules are better evaluated on prior chest CT. 2. New peripheral left upper lung nodular opacity may represent a focus of infection or metastasis. 3. Small to moderate right pleural effusion. Trace left pleural effusion. Electronically Signed   By: Darrin Nipper M.D.   On: 12/21/2022 09:03   CT Lumbar Spine W Contrast  Result Date: 12/06/2022 CLINICAL DATA:  Right flank and leg pain.  History of lung cancer EXAM: CT LUMBAR SPINE WITH CONTRAST TECHNIQUE: Multidetector CT imaging of the lumbar spine was performed with intravenous contrast administration.  RADIATION DOSE REDUCTION: This exam was performed according to the departmental dose-optimization  program which includes automated exposure control, adjustment of the mA and/or kV according to patient size and/or use of iterative reconstruction technique. CONTRAST:  54mL OMNIPAQUE IOHEXOL 350 MG/ML SOLN COMPARISON:  MRI lumbar spine 06/08/2022. CT abdomen pelvis 10/12/2022 FINDINGS: Segmentation: Normal segmentation. Five lumbar vertebra. Lowest disc space L5-S1 Alignment: Mild anterolisthesis L5-S1 otherwise normal alignment Vertebrae: Negative for fracture Numerous sclerotic lesions throughout the lumbar spine as well as the sacrum and iliac bones, stable from the prior CT and MRI. Findings compatible with stable metastatic disease. No new lesions identified. Paraspinal and other soft tissues: Atherosclerotic aorta without aneurysm. No retroperitoneal mass or adenopathy. Subcentimeter left periaortic lymph nodes stable from the prior CT. Disc levels: L1-2: Mild degenerative change.  Negative for stenosis L2-3: Mild disc bulging and mild facet degeneration. Negative for stenosis L3-4: Diffuse bulging of the disc with borderline spinal stenosis. Subarticular stenosis on the right L4-5: Diffuse bulging of the disc and bilateral facet degeneration. Borderline spinal stenosis. Mild subarticular stenosis bilaterally L5-S1: Disc bulging and facet degeneration. Moderate right foraminal narrowing due to spurring and disc bulging. Mild left foraminal narrowing. IMPRESSION: 1. Numerous sclerotic lesions throughout the lumbar spine and pelvis compatible with metastatic disease. These are stable from the prior CT and MRI. No new lesions. 2. Multilevel degenerative change in the lumbar spine. Borderline spinal stenosis L3-4 and L4-5. 3. Moderate right foraminal narrowing L5-S1 due to spurring and disc bulging. Mild left foraminal narrowing L5-S1. 4. Aortic atherosclerosis. Aortic Atherosclerosis (ICD10-I70.0). Electronically Signed   By: Franchot Gallo M.D.   On: 12/06/2022 12:52   CT FEMUR RIGHT W CONTRAST  Result  Date: 12/06/2022 CLINICAL DATA:  Right lower back pain radiating to the right upper leg. EXAM: CT OF THE LOWER RIGHT EXTREMITY WITH CONTRAST TECHNIQUE: Multidetector CT imaging of the lower right extremity was performed according to the standard protocol following intravenous contrast administration. RADIATION DOSE REDUCTION: This exam was performed according to the departmental dose-optimization program which includes automated exposure control, adjustment of the mA and/or kV according to patient size and/or use of iterative reconstruction technique. CONTRAST:  65mL OMNIPAQUE IOHEXOL 350 MG/ML SOLN COMPARISON:  None Available. FINDINGS: Bones/Joint/Cartilage There is a lytic lesion with endosteal scalloping in the intertrochanteric region measuring approximately 2.1 x 2.4 x 3.3 cm. Few other sclerotic lesions in the pelvis and visualized femur the largest in the mid to distal femoral diaphysis measuring at least 1.0 x 0.7 x 1.6 cm. Ligaments Suboptimally assessed by CT. Muscles and Tendons Muscles are normal in bulk. No intramuscular hematoma or fluid collection. Soft tissues Skin and subcutaneous soft tissues are within normal limits. IMPRESSION: 1. Large lytic lesion with endosteal scalloping in the intertrochanteric region of the right femur measuring at least 2.1 x 2.4 x 3.3 cm CT examination dated October 12, 2022. There is increased risk for a pathological fracture of the femur. 2. Few other sclerotic lesions in the pelvis and visualized femur. Findings are concerning for metastatic disease. Recommend further evaluation with bone scan. 3. Muscles and subcutaneous soft tissues are within normal limits. Electronically Signed   By: Keane Police D.O.   On: 12/06/2022 11:06    ASSESSMENT:   left femur lytic lesion       Active Problems:   * No active hospital problems. *    PLAN:  Left IM nail   Carlynn Spry 12/27/2022. 5:25 PM

## 2022-12-28 ENCOUNTER — Encounter
Admission: RE | Admit: 2022-12-28 | Discharge: 2022-12-28 | Disposition: A | Discharge status: Home / Self Care | Payer: Medicaid Other | Source: Ambulatory Visit | Attending: Orthopedic Surgery | Admitting: Orthopedic Surgery

## 2022-12-28 ENCOUNTER — Other Ambulatory Visit: Payer: Self-pay

## 2022-12-28 VITALS — BP 111/66 | HR 121 | Resp 30 | Ht 71.0 in | Wt 134.0 lb

## 2022-12-28 DIAGNOSIS — R42 Dizziness and giddiness: Secondary | ICD-10-CM | POA: Insufficient documentation

## 2022-12-28 DIAGNOSIS — Z01818 Encounter for other preprocedural examination: Secondary | ICD-10-CM | POA: Diagnosis not present

## 2022-12-28 DIAGNOSIS — D649 Anemia, unspecified: Secondary | ICD-10-CM | POA: Insufficient documentation

## 2022-12-28 DIAGNOSIS — R000 Tachycardia, unspecified: Secondary | ICD-10-CM | POA: Insufficient documentation

## 2022-12-28 DIAGNOSIS — R0602 Shortness of breath: Secondary | ICD-10-CM | POA: Diagnosis not present

## 2022-12-28 LAB — URINALYSIS, ROUTINE W REFLEX MICROSCOPIC
Bacteria, UA: NONE SEEN
Bilirubin Urine: NEGATIVE
Glucose, UA: NEGATIVE mg/dL
Hgb urine dipstick: NEGATIVE
Ketones, ur: NEGATIVE mg/dL
Nitrite: NEGATIVE
Protein, ur: NEGATIVE mg/dL
Specific Gravity, Urine: 1.032 — ABNORMAL HIGH (ref 1.005–1.030)
pH: 5 (ref 5.0–8.0)

## 2022-12-28 LAB — TYPE AND SCREEN
ABO/RH(D): O POS
Antibody Screen: NEGATIVE

## 2022-12-28 LAB — SURGICAL PCR SCREEN
MRSA, PCR: NEGATIVE
Staphylococcus aureus: NEGATIVE

## 2022-12-28 NOTE — Patient Instructions (Addendum)
Your procedure is scheduled on: 01/01/23 Report to DAY SURGERY DEPARTMENT LOCATED ON 2ND FLOOR MEDICAL MALL ENTRANCE. To find out your arrival time please call [phone] between 1PM - 3PM on 12/31/22.  Remember: Instructions that are not followed completely may result in serious medical risk, up to and including death, or upon the discretion of your surgeon and anesthesiologist your surgery may need to be rescheduled.     _X__ 1. Do not eat food or drink any liquids after midnight the night before your procedure.                 No gum chewing or hard candies.   __X__2.  On the morning of surgery brush your teeth with toothpaste and water, you                 may rinse your mouth with mouthwash if you wish.  Do not swallow any              toothpaste of mouthwash.     _X__ 3.  No Alcohol for 24 hours before or after surgery.   _X__ 4.  Do Not Smoke or use e-cigarettes For 24 Hours Prior to Your Surgery.                 Do not use any chewable tobacco products for at least 6 hours prior to                 surgery.  ____  5.  Bring all medications with you on the day of surgery if instructed.   __X__  6.  Notify your doctor if there is any change in your medical condition      (cold, fever, infections).     Do not wear jewelry, make-up, hairpins, clips or nail polish. Do not wear lotions, powders, or perfumes. You may wear deodorant Do not shave body hair 48 hours prior to surgery. Men may shave face and neck. Do not bring valuables to the hospital.    [facility] is not responsible for any belongings or valuables.  Contacts, dentures/partials or body piercings may not be worn into surgery. Bring a case for your contacts, glasses or hearing aids, a denture cup will be supplied. Leave your suitcase in the car. After surgery it may be brought to your room. For patients admitted to the hospital, discharge time is determined by your treatment team.   Patients discharged the day of  surgery will need an adult driver. You will not be allowed to drive yourself home, take an uber, lyft or taxi. You may use medical transportation such as [employer], [employer] as long as you have an adult with you or waiting to assist you upon your arrival home. You must have an adult over 71 years old in the home with you for the first 24 hours after surgery   Please read over the following fact sheets that you were given:   MRSA Information, CHG soap, Incentive Spirometer  __X__ Take these medicines the morning of surgery with A SIP OF WATER:    1. dexamethasone (DECADRON) 4 MG tablet   2. gabapentin (NEURONTIN) 100 MG capsule   3. oxyCODONE (OXYCONTIN) 20 mg 12 hr tablet   4.  5.  6.  ____ Fleet Enema (as directed)   __X__ Use CHG Soap/SAGE wipes as directed  __X__ Use inhalers on the day of surgery  USE YOUR NEBULIZER AND YOUR ADVAIR THE MORNING OF SURGERY  ____ Stop metformin/Janumet/Farxiga 2 days prior to surgery    ____ Take 1/2 of usual insulin dose the night before surgery. No insulin the morning          of surgery.   ____ Stop Blood Thinners Coumadin/Plavix/Xarelto/Pleta/Pradaxa/Eliquis/Effient/Aspirin  on   Or contact your Surgeon, Cardiologist or Medical Doctor regarding  ability to stop your blood thinners  __X__ Stop Anti-inflammatories 7 days before surgery such as Advil, Ibuprofen, Motrin,  BC or Goodies Powder, Naprosyn, Naproxen, Aleve, Aspirin    __X__ Stop all herbals and supplements, fish oil or vitamins  until after surgery.    ____ Bring C-Pap to the hospital.    Preparing the Skin Before Surgery     To help prevent the risk of infection at your surgical site, we are now providing you with rinse-free Sage 2% Chlorhexidine Gluconate (CHG) disposable wipes.  Chlorhexidine Gluconate (CHG) Soap  o An antiseptic cleaner that kills germs and bonds with the skin to continue killing germs even after washing  o Used for showering the night before surgery and  morning of surgery  The night before surgery: Shower or bathe with warm water. Do not apply perfume, lotions, powders. Wait one hour after shower. Skin should be dry and cool. Open Sage wipe package - 6 disposable cloths are inside. Wipe body using one cloth for the right arm, one cloth for the left arm, one cloth for the right leg, one cloth for the left leg, one cloth for the chest/abdomen area (do not use on breasts if breast feeding), and one cloth for the back. 5. Do not rinse, allow to dry. 6. Skin may fee "tacky" for several minutes. 7. Dress in clean clothes. 8. Place clean sheets on your bed and do not sleep with pets.  REPEAT ABOVE ON THE MORNING OF SURGERY PRIOR TO ARRIVING TO THE HOSPITAL.

## 2022-12-30 ENCOUNTER — Other Ambulatory Visit: Payer: Self-pay | Admitting: Oncology

## 2022-12-31 MED ORDER — ORAL CARE MOUTH RINSE: 15.0000 mL | Freq: Once | OROMUCOSAL | Status: AC

## 2022-12-31 MED ORDER — LACTATED RINGERS IV SOLN: INTRAVENOUS | Status: DC

## 2022-12-31 MED ORDER — FAMOTIDINE 20 MG PO TABS: 20.0000 mg | ORAL_TABLET | Freq: Once | ORAL | Status: AC

## 2022-12-31 MED ORDER — CHLORHEXIDINE GLUCONATE 0.12 % MT SOLN: 15.0000 mL | Freq: Once | OROMUCOSAL | Status: AC

## 2022-12-31 MED ORDER — CEFAZOLIN SODIUM-DEXTROSE 2-4 GM/100ML-% IV SOLN
2.0000 g | INTRAVENOUS | Status: AC
  Administered 2023-01-01: 2 g via INTRAVENOUS

## 2023-01-01 ENCOUNTER — Ambulatory Visit: Payer: Medicaid Other | Admitting: Anesthesiology

## 2023-01-01 ENCOUNTER — Other Ambulatory Visit: Payer: Self-pay

## 2023-01-01 ENCOUNTER — Encounter: Payer: Self-pay | Admitting: Orthopedic Surgery

## 2023-01-01 ENCOUNTER — Observation Stay
Admission: RE | Admit: 2023-01-01 | Discharge: 2023-01-02 | Disposition: A | Discharge status: Home / Self Care | Payer: Medicaid Other | Attending: Orthopedic Surgery | Admitting: Orthopedic Surgery

## 2023-01-01 ENCOUNTER — Ambulatory Visit: Payer: Medicaid Other

## 2023-01-01 ENCOUNTER — Encounter
Admission: RE | Disposition: A | Discharge status: Home / Self Care | Payer: Self-pay | Source: Home / Self Care | Attending: Orthopedic Surgery

## 2023-01-01 DIAGNOSIS — M84451A Pathological fracture, right femur, initial encounter for fracture: Secondary | ICD-10-CM | POA: Diagnosis present

## 2023-01-01 DIAGNOSIS — C7951 Secondary malignant neoplasm of bone: Secondary | ICD-10-CM | POA: Diagnosis not present

## 2023-01-01 DIAGNOSIS — J449 Chronic obstructive pulmonary disease, unspecified: Secondary | ICD-10-CM | POA: Insufficient documentation

## 2023-01-01 DIAGNOSIS — Z9189 Other specified personal risk factors, not elsewhere classified: Secondary | ICD-10-CM

## 2023-01-01 HISTORY — PX: FEMUR IM NAIL: SHX1597

## 2023-01-01 LAB — ABO/RH: ABO/RH(D): O POS

## 2023-01-01 SURGERY — INSERTION, INTRAMEDULLARY ROD, FEMUR
Anesthesia: General | Site: Hip | Laterality: Right

## 2023-01-01 MED ORDER — PROCHLORPERAZINE MALEATE 10 MG PO TABS: 10.0000 mg | ORAL_TABLET | Freq: Four times a day (QID) | ORAL | Status: DC | PRN

## 2023-01-01 MED ORDER — ESMOLOL HCL 100 MG/10ML IV SOLN
INTRAVENOUS | Status: AC
  Filled 2023-01-01: qty 10

## 2023-01-01 MED ORDER — PHENYLEPHRINE 80 MCG/ML (10ML) SYRINGE FOR IV PUSH (FOR BLOOD PRESSURE SUPPORT)
PREFILLED_SYRINGE | INTRAVENOUS | Status: AC
  Filled 2023-01-01: qty 10

## 2023-01-01 MED ORDER — AMOXICILLIN-POT CLAVULANATE 875-125 MG PO TABS: 1.0000 | ORAL_TABLET | Freq: Two times a day (BID) | ORAL | Status: DC

## 2023-01-01 MED ORDER — DOCUSATE SODIUM 100 MG PO CAPS
100.0000 mg | ORAL_CAPSULE | Freq: Two times a day (BID) | ORAL | Status: DC
  Administered 2023-01-01: 100 mg via ORAL

## 2023-01-01 MED ORDER — FENTANYL CITRATE PF 50 MCG/ML IJ SOSY
50.0000 ug | PREFILLED_SYRINGE | Freq: Once | INTRAMUSCULAR | Status: AC
  Administered 2023-01-01: 50 ug via INTRAVENOUS

## 2023-01-01 MED ORDER — OXYCODONE HCL ER 10 MG PO T12A
20.0000 mg | EXTENDED_RELEASE_TABLET | Freq: Two times a day (BID) | ORAL | Status: DC
  Administered 2023-01-01: 20 mg via ORAL

## 2023-01-01 MED ORDER — CEFAZOLIN SODIUM-DEXTROSE 2-4 GM/100ML-% IV SOLN
INTRAVENOUS | Status: AC
  Filled 2023-01-01: qty 100

## 2023-01-01 MED ORDER — IPRATROPIUM-ALBUTEROL 0.5-2.5 (3) MG/3ML IN SOLN
3.0000 mL | Freq: Three times a day (TID) | RESPIRATORY_TRACT | Status: DC
  Administered 2023-01-01: 3 mL via RESPIRATORY_TRACT

## 2023-01-01 MED ORDER — LIDOCAINE HCL (PF) 2 % IJ SOLN
INTRAMUSCULAR | Status: AC
  Filled 2023-01-01: qty 10

## 2023-01-01 MED ORDER — HYDROMORPHONE HCL 1 MG/ML IJ SOLN
0.5000 mg | INTRAMUSCULAR | Status: DC | PRN
  Administered 2023-01-02: 0.5 mg via INTRAVENOUS
  Administered 2023-01-02: 1 mg via INTRAVENOUS

## 2023-01-01 MED ORDER — IPRATROPIUM-ALBUTEROL 0.5-2.5 (3) MG/3ML IN SOLN
RESPIRATORY_TRACT | Status: AC
  Filled 2023-01-01: qty 3

## 2023-01-01 MED ORDER — GUAIFENESIN ER 600 MG PO TB12
600.0000 mg | ORAL_TABLET | Freq: Two times a day (BID) | ORAL | Status: DC
  Administered 2023-01-01 – 2023-01-02 (×2): 600 mg via ORAL
  Filled 2023-01-01 (×3): qty 1

## 2023-01-01 MED ORDER — DEXAMETHASONE SODIUM PHOSPHATE 10 MG/ML IJ SOLN
INTRAMUSCULAR | Status: DC | PRN
  Administered 2023-01-01: 10 mg via INTRAVENOUS

## 2023-01-01 MED ORDER — GABAPENTIN 300 MG PO CAPS
ORAL_CAPSULE | ORAL | Status: AC
  Administered 2023-01-01: 100 mg via ORAL
  Filled 2023-01-01: qty 1

## 2023-01-01 MED ORDER — CHLORHEXIDINE GLUCONATE 0.12 % MT SOLN
OROMUCOSAL | Status: AC
  Administered 2023-01-01: 15 mL via OROMUCOSAL
  Filled 2023-01-01: qty 15

## 2023-01-01 MED ORDER — PROPOFOL 1000 MG/100ML IV EMUL
INTRAVENOUS | Status: AC
  Filled 2023-01-01: qty 100

## 2023-01-01 MED ORDER — ACETAMINOPHEN 500 MG PO TABS: 1000.0000 mg | ORAL_TABLET | Freq: Three times a day (TID) | ORAL | Status: DC | PRN

## 2023-01-01 MED ORDER — FENTANYL CITRATE (PF) 100 MCG/2ML IJ SOLN
INTRAMUSCULAR | Status: AC
  Filled 2023-01-01: qty 2

## 2023-01-01 MED ORDER — DOCUSATE SODIUM 100 MG PO CAPS
ORAL_CAPSULE | ORAL | Status: AC
  Filled 2023-01-01: qty 1

## 2023-01-01 MED ORDER — OXYCODONE HCL 5 MG PO TABS
5.0000 mg | ORAL_TABLET | Freq: Once | ORAL | Status: AC | PRN
  Administered 2023-01-01: 5 mg via ORAL

## 2023-01-01 MED ORDER — ACETAMINOPHEN 10 MG/ML IV SOLN: 1000.0000 mg | Freq: Once | INTRAVENOUS | Status: DC | PRN

## 2023-01-01 MED ORDER — MIDAZOLAM HCL 5 MG/5ML IJ SOLN
INTRAMUSCULAR | Status: DC | PRN
  Administered 2023-01-01: 2 mg via INTRAVENOUS

## 2023-01-01 MED ORDER — ESMOLOL HCL 100 MG/10ML IV SOLN
INTRAVENOUS | Status: DC | PRN
  Administered 2023-01-01 (×2): 5 mg via INTRAVENOUS
  Administered 2023-01-01: 10 mg via INTRAVENOUS

## 2023-01-01 MED ORDER — ONDANSETRON HCL 4 MG PO TABS: 4.0000 mg | ORAL_TABLET | Freq: Four times a day (QID) | ORAL | Status: DC | PRN

## 2023-01-01 MED ORDER — DEXAMETHASONE SODIUM PHOSPHATE 10 MG/ML IJ SOLN
INTRAMUSCULAR | Status: AC
  Filled 2023-01-01: qty 1

## 2023-01-01 MED ORDER — OXYCODONE HCL ER 10 MG PO T12A
EXTENDED_RELEASE_TABLET | ORAL | Status: AC
  Filled 2023-01-01: qty 2

## 2023-01-01 MED ORDER — BUPIVACAINE HCL (PF) 0.5 % IJ SOLN
INTRAMUSCULAR | Status: DC | PRN
  Administered 2023-01-01: 2.4 mL via INTRATHECAL

## 2023-01-01 MED ORDER — DEXAMETHASONE 4 MG PO TABS
4.0000 mg | ORAL_TABLET | Freq: Every day | ORAL | Status: DC
  Administered 2023-01-02: 4 mg via ORAL
  Filled 2023-01-01: qty 1

## 2023-01-01 MED ORDER — ONDANSETRON HCL 4 MG/2ML IJ SOLN
INTRAMUSCULAR | Status: DC | PRN
  Administered 2023-01-01: 4 mg via INTRAVENOUS

## 2023-01-01 MED ORDER — TRANEXAMIC ACID 1000 MG/10ML IV SOLN
INTRAVENOUS | Status: AC
  Filled 2023-01-01: qty 10

## 2023-01-01 MED ORDER — STERILE WATER FOR IRRIGATION IR SOLN
Status: DC | PRN
  Administered 2023-01-01: 1000 mL

## 2023-01-01 MED ORDER — PROPOFOL 500 MG/50ML IV EMUL
INTRAVENOUS | Status: DC | PRN
  Administered 2023-01-01: 30 mg via INTRAVENOUS
  Administered 2023-01-01: 100 ug/kg/min via INTRAVENOUS

## 2023-01-01 MED ORDER — ONDANSETRON HCL 4 MG/2ML IJ SOLN
INTRAMUSCULAR | Status: AC
  Filled 2023-01-01: qty 2

## 2023-01-01 MED ORDER — BUPIVACAINE-EPINEPHRINE 0.5% -1:200000 IJ SOLN
INTRAMUSCULAR | Status: DC | PRN
  Administered 2023-01-01: 20 mL

## 2023-01-01 MED ORDER — ACETAMINOPHEN 325 MG PO TABS: 325.0000 mg | ORAL_TABLET | Freq: Four times a day (QID) | ORAL | Status: DC | PRN

## 2023-01-01 MED ORDER — OXYCODONE HCL 5 MG PO TABS: 5.0000 mg | ORAL_TABLET | ORAL | Status: DC | PRN

## 2023-01-01 MED ORDER — MOMETASONE FURO-FORMOTEROL FUM 200-5 MCG/ACT IN AERO
2.0000 | INHALATION_SPRAY | Freq: Two times a day (BID) | RESPIRATORY_TRACT | Status: DC
  Administered 2023-01-01 – 2023-01-02 (×2): 2 via RESPIRATORY_TRACT
  Filled 2023-01-01: qty 8.8

## 2023-01-01 MED ORDER — LIDOCAINE HCL (CARDIAC) PF 100 MG/5ML IV SOSY
PREFILLED_SYRINGE | INTRAVENOUS | Status: DC | PRN
  Administered 2023-01-01: 40 mg via INTRAVENOUS

## 2023-01-01 MED ORDER — ONDANSETRON HCL 4 MG/2ML IJ SOLN: 4.0000 mg | Freq: Four times a day (QID) | INTRAMUSCULAR | Status: DC | PRN

## 2023-01-01 MED ORDER — LACTATED RINGERS IV SOLN: INTRAVENOUS | Status: DC

## 2023-01-01 MED ORDER — PHENYLEPHRINE HCL (PRESSORS) 10 MG/ML IV SOLN
INTRAVENOUS | Status: DC | PRN
  Administered 2023-01-01 (×5): 80 ug via INTRAVENOUS

## 2023-01-01 MED ORDER — MIDAZOLAM HCL 2 MG/2ML IJ SOLN
INTRAMUSCULAR | Status: AC
  Filled 2023-01-01: qty 2

## 2023-01-01 MED ORDER — ACETAMINOPHEN 10 MG/ML IV SOLN
INTRAVENOUS | Status: DC | PRN
  Administered 2023-01-01: 1000 mg via INTRAVENOUS

## 2023-01-01 MED ORDER — METOCLOPRAMIDE HCL 5 MG/ML IJ SOLN: 5.0000 mg | Freq: Three times a day (TID) | INTRAMUSCULAR | Status: DC | PRN

## 2023-01-01 MED ORDER — GABAPENTIN 100 MG PO CAPS
100.0000 mg | ORAL_CAPSULE | Freq: Three times a day (TID) | ORAL | Status: DC
  Administered 2023-01-01: 100 mg via ORAL

## 2023-01-01 MED ORDER — GABAPENTIN 100 MG PO CAPS
ORAL_CAPSULE | ORAL | Status: AC
  Filled 2023-01-01: qty 1

## 2023-01-01 MED ORDER — FENTANYL CITRATE PF 50 MCG/ML IJ SOSY
PREFILLED_SYRINGE | INTRAMUSCULAR | Status: AC
  Filled 2023-01-01: qty 1

## 2023-01-01 MED ORDER — PROPOFOL 10 MG/ML IV BOLUS
INTRAVENOUS | Status: AC
  Filled 2023-01-01: qty 20

## 2023-01-01 MED ORDER — ACETAMINOPHEN 10 MG/ML IV SOLN
INTRAVENOUS | Status: AC
  Filled 2023-01-01: qty 100

## 2023-01-01 MED ORDER — FENTANYL CITRATE (PF) 100 MCG/2ML IJ SOLN
INTRAMUSCULAR | Status: DC | PRN
  Administered 2023-01-01: 50 ug via INTRAVENOUS

## 2023-01-01 MED ORDER — OXYCODONE HCL 5 MG PO TABS
ORAL_TABLET | ORAL | Status: AC
  Filled 2023-01-01: qty 1

## 2023-01-01 MED ORDER — ONDANSETRON HCL 4 MG/2ML IJ SOLN: 4.0000 mg | Freq: Once | INTRAMUSCULAR | Status: DC | PRN

## 2023-01-01 MED ORDER — TRANEXAMIC ACID-NACL 1000-0.7 MG/100ML-% IV SOLN
INTRAVENOUS | Status: DC | PRN
  Administered 2023-01-01: 1000 mg via INTRAVENOUS

## 2023-01-01 MED ORDER — OXYCODONE HCL 5 MG/5ML PO SOLN: 5.0000 mg | Freq: Once | ORAL | Status: AC | PRN

## 2023-01-01 MED ORDER — ALBUTEROL SULFATE HFA 108 (90 BASE) MCG/ACT IN AERS: 2.0000 | INHALATION_SPRAY | Freq: Four times a day (QID) | RESPIRATORY_TRACT | Status: DC | PRN

## 2023-01-01 MED ORDER — ACETAMINOPHEN 500 MG PO TABS
ORAL_TABLET | ORAL | Status: AC
  Filled 2023-01-01: qty 2

## 2023-01-01 MED ORDER — FENTANYL CITRATE (PF) 100 MCG/2ML IJ SOLN: 25.0000 ug | INTRAMUSCULAR | Status: DC | PRN

## 2023-01-01 MED ORDER — METOCLOPRAMIDE HCL 10 MG PO TABS: 5.0000 mg | ORAL_TABLET | Freq: Three times a day (TID) | ORAL | Status: DC | PRN

## 2023-01-01 MED ORDER — 0.9 % SODIUM CHLORIDE (POUR BTL) OPTIME
TOPICAL | Status: DC | PRN
  Administered 2023-01-01: 500 mL

## 2023-01-01 MED ORDER — KETOROLAC TROMETHAMINE 15 MG/ML IJ SOLN
7.5000 mg | Freq: Four times a day (QID) | INTRAMUSCULAR | Status: AC
  Administered 2023-01-01 – 2023-01-02 (×3): 7.5 mg via INTRAVENOUS

## 2023-01-01 MED ORDER — KETOROLAC TROMETHAMINE 15 MG/ML IJ SOLN
INTRAMUSCULAR | Status: AC
  Filled 2023-01-01: qty 1

## 2023-01-01 MED ORDER — FAMOTIDINE 20 MG PO TABS
ORAL_TABLET | ORAL | Status: AC
  Administered 2023-01-01: 20 mg via ORAL
  Filled 2023-01-01: qty 1

## 2023-01-01 SURGICAL SUPPLY — 49 items
APL PRP STRL LF DISP 70% ISPRP (MISCELLANEOUS) ×2
BASKET BONE COLLECTION (BASKET) IMPLANT
BIT DRILL CANN 16 HIP (BIT) IMPLANT
BIT DRILL CANN STP 6/9 HIP (BIT) IMPLANT
BIT DRILL SHORT 4.2 (BIT) IMPLANT
BLADE TFNA HELICAL 100 NS (Anchor) IMPLANT
BNDG CMPR 5X4 CHSV STRCH STRL (GAUZE/BANDAGES/DRESSINGS)
BNDG COHESIVE 4X5 TAN STRL LF (GAUZE/BANDAGES/DRESSINGS) IMPLANT
BRUSH SCRUB EZ  4% CHG (MISCELLANEOUS) ×2
BRUSH SCRUB EZ 4% CHG (MISCELLANEOUS) ×2 IMPLANT
CHLORAPREP W/TINT 26 (MISCELLANEOUS) ×1 IMPLANT
DRAPE 3/4 80X56 (DRAPES) ×1 IMPLANT
DRAPE ORTHO SPLIT 77X108 STRL (DRAPES) ×1
DRAPE SURG ORHT 6 SPLT 77X108 (DRAPES) IMPLANT
DRAPE U-SHAPE 47X51 STRL (DRAPES) ×1 IMPLANT
DRILL BIT SHORT 4.2 (BIT) ×1
DRSG AQUACEL AG ADV 3.5X 4 (GAUZE/BANDAGES/DRESSINGS) IMPLANT
DRSG AQUACEL AG ADV 3.5X10 (GAUZE/BANDAGES/DRESSINGS) ×1 IMPLANT
ELECT REM PT RETURN 9FT ADLT (ELECTROSURGICAL) ×1
ELECTRODE REM PT RTRN 9FT ADLT (ELECTROSURGICAL) ×1 IMPLANT
GAUZE XEROFORM 1X8 LF (GAUZE/BANDAGES/DRESSINGS) ×1 IMPLANT
GLOVE BIOGEL PI IND STRL 8.5 (GLOVE) ×1 IMPLANT
GLOVE SURG ORTHO 8.5 STRL (GLOVE) ×1 IMPLANT
GOWN STRL REUS W/ TWL LRG LVL3 (GOWN DISPOSABLE) ×1 IMPLANT
GOWN STRL REUS W/ TWL XL LVL3 (GOWN DISPOSABLE) ×1 IMPLANT
GOWN STRL REUS W/TWL LRG LVL3 (GOWN DISPOSABLE) ×1
GOWN STRL REUS W/TWL XL LVL3 (GOWN DISPOSABLE) ×1
GUIDEWIRE 3.2X400 (WIRE) IMPLANT
KIT PATIENT CARE HANA TABLE (KITS) ×1 IMPLANT
KIT TURNOVER CYSTO (KITS) ×1 IMPLANT
MANIFOLD NEPTUNE II (INSTRUMENTS) ×1 IMPLANT
MAT ABSORB  FLUID 56X50 GRAY (MISCELLANEOUS) ×1
MAT ABSORB FLUID 56X50 GRAY (MISCELLANEOUS) ×1 IMPLANT
NAIL CAN TFNA 9 130D 400 RT (Nail) IMPLANT
NDL SPNL 20GX3.5 QUINCKE YW (NEEDLE) ×1 IMPLANT
NEEDLE SPNL 20GX3.5 QUINCKE YW (NEEDLE) ×1 IMPLANT
NS IRRIG 1000ML POUR BTL (IV SOLUTION) ×1 IMPLANT
PACK HIP COMPR (MISCELLANEOUS) ×1 IMPLANT
SCREW LOCK STAR 5X40 (Screw) IMPLANT
SCREW LOCK STAR 5X42 (Screw) IMPLANT
SCREW LOCK STAR 5X44 (Screw) IMPLANT
STAPLER SKIN PROX 35W (STAPLE) ×1 IMPLANT
SUT VIC AB 0 CT1 36 (SUTURE) ×1 IMPLANT
SUT VIC AB 2-0 CT1 27 (SUTURE) ×2
SUT VIC AB 2-0 CT1 TAPERPNT 27 (SUTURE) ×2 IMPLANT
SYR 30ML LL (SYRINGE) ×1 IMPLANT
TOWEL OR 17X26 4PK STRL BLUE (TOWEL DISPOSABLE) ×1 IMPLANT
TRAP FLUID SMOKE EVACUATOR (MISCELLANEOUS) ×1 IMPLANT
WATER STERILE IRR 500ML POUR (IV SOLUTION) ×1 IMPLANT

## 2023-01-01 NOTE — H&P (Signed)
The patient has been re-examined, and the chart reviewed, and there have been no interval changes to the documented history and physical.  Plan a right hip trochanteric nail for impending pathologic fracture today.  Anesthesia is not consulted regarding a peripheral nerve block for post-operative pain.  The risks, benefits, and alternatives have been discussed at length, and the patient is willing to proceed.

## 2023-01-01 NOTE — Transfer of Care (Signed)
Immediate Anesthesia Transfer of Care Note  Patient: Brittney Fisher  Procedure(s) Performed: TREATMENT, INTERTROCHANTERIC FEMORAL FRACTURE, WITH INTRAMEDULLARY IMPLANT (SURG) (Right: Hip)  Patient Location: PACU  Anesthesia Type:Spinal  Level of Consciousness: drowsy  Airway & Oxygen Therapy: Patient Spontanous Breathing and Patient connected to face mask oxygen  Post-op Assessment: Report given to RN and Post -op Vital signs reviewed and stable  Post vital signs: Reviewed and stable  Last Vitals:  Vitals Value Taken Time  BP 98/52 01/01/23 1450  Temp 36.2 C 01/01/23 1450  Pulse 95 01/01/23 1451  Resp 22 01/01/23 1454  SpO2 100 % 01/01/23 1451  Vitals shown include unvalidated device data.  Last Pain:  Vitals:   01/01/23 1117  TempSrc: Tympanic         Complications: No notable events documented.

## 2023-01-01 NOTE — Anesthesia Preprocedure Evaluation (Addendum)
Anesthesia Evaluation  Patient identified by MRN, date of birth, ID band Patient awake    Reviewed: Allergy & Precautions, NPO status , Patient's Chart, lab work & pertinent test results  History of Anesthesia Complications Negative for: history of anesthetic complications  Airway Mallampati: I   Neck ROM: Full    Dental  (+) Poor Dentition   Pulmonary COPD, former smoker (quit 2022) Lung cancer with mets to bone and mild to moderate right pleural effusion   Pulmonary exam normal breath sounds clear to auscultation       Cardiovascular Exercise Tolerance: Good Normal cardiovascular exam Rhythm:Regular Rate:Normal  ECG 12/28/22:  Sinus tachycardia (HR 115) Possible Left atrial enlargement ST elevation, consider early repolarization, pericarditis, or injury Abnormal ECG When compared with ECG of 21-Nov-2021 14:10, No significant change was found   Neuro/Psych negative neurological ROS     GI/Hepatic ,GERD  ,,  Endo/Other  negative endocrine ROS    Renal/GU negative Renal ROS     Musculoskeletal   Abdominal   Peds  Hematology  (+) Blood dyscrasia, anemia   Anesthesia Other Findings   Reproductive/Obstetrics                             Anesthesia Physical Anesthesia Plan  ASA: 3  Anesthesia Plan: General and Spinal   Post-op Pain Management:    Induction: Intravenous  PONV Risk Score and Plan: 3 and Propofol infusion, TIVA, Treatment may vary due to age or medical condition and Ondansetron  Airway Management Planned: Natural Airway and Nasal Cannula  Additional Equipment:   Intra-op Plan:   Post-operative Plan:   Informed Consent: I have reviewed the patients History and Physical, chart, labs and discussed the procedure including the risks, benefits and alternatives for the proposed anesthesia with the patient or authorized representative who has indicated his/her  understanding and acceptance.       Plan Discussed with: CRNA  Anesthesia Plan Comments: (Patient's niece at bedside for discussion.  Plan for spinal and GA with natural airway, LMA/GETA backup.  Patient consented for risks of anesthesia including but not limited to:  - adverse reactions to medications - damage to eyes, teeth, lips or other oral mucosa - nerve damage due to positioning  - sore throat or hoarseness - headache, bleeding, infection, nerve damage 2/2 spinal - damage to heart, brain, nerves, lungs, other parts of body or loss of life  Informed patient about role of CRNA in peri- and intra-operative care.  Patient voiced understanding.)        Anesthesia Quick Evaluation

## 2023-01-01 NOTE — Progress Notes (Signed)
Patient asked the tech to please pull all 4 side rails up for her security in the bed. Tech came and Occupational hygienist.

## 2023-01-01 NOTE — Progress Notes (Signed)
Patient bladder scanned and found to have 334 urine in bladder.  MD notified and did not want to do a I&O cath. But would rather give the patient a few more hours to try using the restroom on her own.    Patient had moderate dorsiflexion, can feel touch to her toes and feet, and denies pain.

## 2023-01-01 NOTE — Progress Notes (Signed)
Pt pain is 8/10. Dr. Ronni Rumble notified. Acknowledged. Orders received. See MAR.

## 2023-01-01 NOTE — Op Note (Signed)
DATE OF SURGERY:  01/01/2023  TIME: 2:43 PM  PATIENT NAME:  Brittney Fisher  AGE: 62 y.o.  PRE-OPERATIVE DIAGNOSIS:  C79.51 Secondary malignant neoplasm of bone M84.451A Pathological fracture, right femur, init encntr for fracture  POST-OPERATIVE DIAGNOSIS:  SAME  PROCEDURE:  RIGHT TREATMENT, INTERTROCHANTERIC IMPENDING PATHOLOGIC FRACTURE, WITH INTRAMEDULLARY IMPLANT (SURG)  SURGEON:  Lyndle Herrlich  ASSIST: Altamese Cabal, PA-C  EBL:  50 cc  SPECIMENS: Proximal femoral bone marrow was sent for pathology  COMPLICATIONS:  none apparent  OPERATIVE IMPLANTS: Synthes trochanteric femoral nail  400 mm by 9 mm  with interlocking helical blade  100 mm, 5.0 mm locking bolts, 44 mm and 40 mm  PREOPERATIVE INDICATIONS:  [name] is a 62 y.o. year old who was diagnosed with impending pathologic fracture of the intertrochanteric region of the right femur. She elected for surgical intervention.    The risks benefits and alternatives were discussed with the patient including but not limited to the risks of nonoperative treatment, versus surgical intervention including infection, bleeding, nerve injury, malunion, nonunion, hardware prominence, hardware failure, need for hardware removal, blood clots, cardiopulmonary complications, morbidity, mortality, among others, and they were willing to proceed.    OPERATIVE PROCEDURE:  The patient was brought to the operating room and placed in the supine position.  Spinal anesthesia was administered. She was placed on the fracture table. The length of the femur was also measured using fluoroscopy. Time out was then performed after sterile prep and drape. She received preoperative antibiotics.  Incision was made proximal to the greater trochanter. A guidewire was placed in the appropriate position. Confirmation was made on AP and lateral views. The above-named nail was opened. I opened the proximal femur with a reamer. I then placed the nail by hand  easily down. I did not need to ream the femur. Marrow was sent for pathology.  Once the nail was completely seated, I placed a guidepin into the femoral head into the center center position through a second incision.  I measured the length, and then reamed the lateral cortex and up into the head. I then placed the helical blade.  Anatomic fixation achieved. Bone quality was fair.  I then secured the proximal interlock. Using perfect circle technique two distal interlocking screws were placed.  I then removed the instruments, and took final C-arm pictures AP and lateral the entire length of the leg. Anatomic reconstruction was achieved, and the wounds were irrigated copiously and closed with Vicryl  followed by staples and dry sterile dressing. Sponge and needle count were correct.   The patient was awakened and returned to PACU in stable and satisfactory condition. There no complications and the patient tolerated the procedure well.  She will be weightbearing as tolerated.    Lyndle Herrlich

## 2023-01-01 NOTE — Anesthesia Procedure Notes (Signed)
Spinal  Patient location during procedure: OR Start time: 01/01/2023 1:09 PM End time: 01/01/2023 1:16 PM Reason for block: surgical anesthesia Staffing Performed: resident/CRNA  Resident/CRNA: Morene Crocker, CRNA Performed by: Morene Crocker, CRNA Authorized by: Reed Breech, MD   Preanesthetic Checklist Completed: patient identified, IV checked, site marked, risks and benefits discussed, surgical consent, monitors and equipment checked, pre-op evaluation and timeout performed Spinal Block Patient position: sitting Prep: ChloraPrep Patient monitoring: heart rate, continuous pulse ox and blood pressure Approach: midline Location: L3-4 Injection technique: single-shot Needle Needle type: Introducer and Pencan  Needle gauge: 24 G Needle length: 9 cm Assessment Sensory level: T6 Events: CSF return Additional Notes Negative paresthesia. Negative blood return. Positive free-flowing CSF. Expiration date of kit checked and confirmed. Patient tolerated procedure well, without complications. Successful on first attempt. Pt. Tolerated procedure well, no complications noted.

## 2023-01-02 ENCOUNTER — Encounter: Payer: Self-pay | Admitting: Orthopedic Surgery

## 2023-01-02 ENCOUNTER — Other Ambulatory Visit: Payer: Self-pay | Admitting: *Deleted

## 2023-01-02 DIAGNOSIS — M84451A Pathological fracture, right femur, initial encounter for fracture: Secondary | ICD-10-CM | POA: Diagnosis not present

## 2023-01-02 MED ORDER — GABAPENTIN 100 MG PO CAPS
ORAL_CAPSULE | ORAL | Status: AC
  Administered 2023-01-02: 100 mg via ORAL
  Filled 2023-01-02: qty 1

## 2023-01-02 MED ORDER — DOCUSATE SODIUM 100 MG PO CAPS
ORAL_CAPSULE | ORAL | Status: AC
  Administered 2023-01-02: 100 mg via ORAL
  Filled 2023-01-02: qty 1

## 2023-01-02 MED ORDER — OXYCODONE HCL ER 10 MG PO T12A
EXTENDED_RELEASE_TABLET | ORAL | Status: AC
  Administered 2023-01-02: 20 mg via ORAL
  Filled 2023-01-02: qty 2

## 2023-01-02 MED ORDER — OXYCODONE HCL ER 20 MG PO T12A: 20.0000 mg | EXTENDED_RELEASE_TABLET | Freq: Two times a day (BID) | ORAL | 0 refills | Status: DC

## 2023-01-02 MED ORDER — KETOROLAC TROMETHAMINE 15 MG/ML IJ SOLN
INTRAMUSCULAR | Status: AC
  Administered 2023-01-02: 7.5 mg via INTRAVENOUS
  Filled 2023-01-02: qty 1

## 2023-01-02 MED ORDER — HYDROMORPHONE HCL 1 MG/ML IJ SOLN
INTRAMUSCULAR | Status: AC
  Filled 2023-01-02: qty 1

## 2023-01-02 MED ORDER — HYDROMORPHONE HCL 1 MG/ML IJ SOLN
INTRAMUSCULAR | Status: AC
  Filled 2023-01-02: qty 0.5

## 2023-01-02 MED ORDER — OXYCODONE HCL 5 MG PO TABS: 5.0000 mg | ORAL_TABLET | ORAL | 0 refills | Status: DC | PRN

## 2023-01-02 MED ORDER — KETOROLAC TROMETHAMINE 15 MG/ML IJ SOLN
INTRAMUSCULAR | Status: AC
  Filled 2023-01-02: qty 1

## 2023-01-02 MED ORDER — IPRATROPIUM-ALBUTEROL 0.5-2.5 (3) MG/3ML IN SOLN
RESPIRATORY_TRACT | Status: AC
  Administered 2023-01-02: 3 mL via RESPIRATORY_TRACT
  Filled 2023-01-02: qty 3

## 2023-01-02 NOTE — Discharge Summary (Signed)
Physician Discharge Summary  Patient ID: Brittney Fisher MRN: [id number] DOB/AGE: 01-30-1961 62 y.o.  Admit date: 01/01/2023 Discharge date: 01/02/2023  Admission Diagnoses:  C79.51 Secondary malignant neoplasm of bone M84.451A Pathological fracture, right femur, init encntr for fracture Impending pathologic fracture  Discharge Diagnoses:  C79.51 Secondary malignant neoplasm of bone M84.451A Pathological fracture, right femur, init encntr for fracture Principal Problem:   Impending pathologic fracture   Past Medical History:  Diagnosis Date   COPD (chronic obstructive pulmonary disease) (HCC)    GERD (gastroesophageal reflux disease)    Metastatic lung cancer (metastasis from lung to other site) ([place]) 11/02/2020    Surgeries: Procedure(s): TREATMENT, INTERTROCHANTERIC FEMORAL FRACTURE, WITH INTRAMEDULLARY IMPLANT (SURG) on 01/01/2023   Consultants (if any):   Discharged Condition: Improved  Hospital Course: Brittney Fisher is an 62 y.o. female who was admitted 01/01/2023 with a diagnosis of  C79.51 Secondary malignant neoplasm of bone M84.451A Pathological fracture, right femur, init encntr for fracture Impending pathologic fracture and went to the operating room on 01/01/2023 and underwent the above named procedures.    She was given perioperative antibiotics:  Anti-infectives (From admission, onward)    Start     Dose/Rate Route Frequency Ordered Stop   01/01/23 2200  amoxicillin-clavulanate (AUGMENTIN) 875-125 MG per tablet 1 tablet  Status:  Discontinued        1 tablet Oral 2 times daily 01/01/23 1736 01/01/23 1751   01/01/23 1102  ceFAZolin (ANCEF) 2-4 GM/100ML-% IVPB       Note to Pharmacy: Jeanene Erb E: cabinet override      01/01/23 1102 01/01/23 1339   01/01/23 0600  ceFAZolin (ANCEF) IVPB 2g/100 mL premix        2 g 200 mL/hr over 30 Minutes Intravenous On call to O.R. 12/31/22 2255 01/01/23 1335     .  She was given sequential compression devices,  early ambulation.  She benefited maximally from the hospital stay and there were no complications.    Recent vital signs:  Vitals:   01/01/23 2021 01/02/23 0400  BP: 110/68 112/64  Pulse: (!) 109 98  Resp: 19 16  Temp: 99 F (37.2 C) 98.4 F (36.9 C)  SpO2: 94% 94%    Recent laboratory studies:  Lab Results  Component Value Date   HGB 11.6 (L) 12/21/2022   HGB 11.9 (L) 12/11/2022   HGB 12.1 12/05/2022   Lab Results  Component Value Date   WBC 6.1 12/21/2022   PLT 221 12/21/2022   No results found for: "INR" Lab Results  Component Value Date   NA 137 12/21/2022   K 3.6 12/21/2022   CL 99 12/21/2022   CO2 28 12/21/2022   BUN 13 12/21/2022   CREATININE 0.60 12/21/2022   GLUCOSE 100 (H) 12/21/2022    Discharge Medications:   Allergies as of 01/02/2023       Reactions   Carboplatin Shortness Of Breath, Itching, Cough        Medication List     TAKE these medications    acetaminophen 500 MG tablet Commonly known as: TYLENOL Take 1,000 mg by mouth every 8 (eight) hours as needed for moderate pain.   albuterol 108 (90 Base) MCG/ACT inhaler Commonly known as: VENTOLIN HFA Inhale 2 puffs into the lungs every 6 (six) hours as needed for wheezing or shortness of breath.   ALPRAZolam 0.5 MG tablet Commonly known as: XANAX Take 1 tablet (0.5 mg total) by mouth See admin instructions. Take 1 tablet 60  minutes before procedure; if needed due to incomplete response and/or duration of procedure, may repeat another 1 tablet after 30 minutes.   amoxicillin-clavulanate 875-125 MG tablet Commonly known as: AUGMENTIN Take 1 tablet by mouth 2 (two) times daily.   benzonatate 100 MG capsule Commonly known as: TESSALON Take 1 capsule by mouth three times daily as needed for cough What changed:  how much to take when to take this reasons to take this   calcium carbonate 500 MG chewable tablet Commonly known as: TUMS - dosed in mg elemental calcium Chew 1 tablet by  mouth as needed.   dexamethasone 4 MG tablet Commonly known as: DECADRON Take 1 tablet (4 mg total) by mouth daily.   fluticasone-salmeterol 250-50 MCG/ACT Aepb Commonly known as: Advair Diskus Inhale 1 puff into the lungs in the morning and at bedtime.   folic acid 1 MG tablet Commonly known as: FOLVITE Take 1 tablet (1 mg total) by mouth daily. Start 5-7 days before Alimta chemotherapy. Continue until 21 days after Alimta completed.   gabapentin 100 MG capsule Commonly known as: Neurontin Take 1 capsule (100 mg total) by mouth 3 (three) times daily.   guaiFENesin 600 MG 12 hr tablet Commonly known as: MUCINEX Take 1 tablet (600 mg total) by mouth 2 (two) times daily.   ipratropium-albuterol 0.5-2.5 (3) MG/3ML Soln Commonly known as: DUONEB Take 3 mLs by nebulization 3 (three) times daily.   Lumakras 120 MG tablet Generic drug: sotorasib TAKE 8 TABLETS BY MOUTH DAILY   magic mouthwash w/lidocaine Soln Take 5 mLs by mouth 4 (four) times daily as needed for mouth pain. Sig: Swish/Swallow 5-10 ml four times a day as needed. Dispense 480 ml. 1RF   MIRALAX PO Take by mouth daily as needed.   naloxone 4 MG/0.1ML Liqd nasal spray kit Commonly known as: NARCAN For suspected overdose of narcotics.   ondansetron 8 MG tablet Commonly known as: Zofran Take 1 tablet (8 mg total) by mouth 2 (two) times daily as needed for refractory nausea / vomiting. Start on day 3 after chemo.   oxyCODONE 5 MG immediate release tablet Commonly known as: Oxy IR/ROXICODONE Take 1-2 tablets (5-10 mg total) by mouth every 4 (four) hours as needed for moderate pain or severe pain.   oxyCODONE 20 mg 12 hr tablet Commonly known as: OXYCONTIN Take 1 tablet (20 mg total) by mouth every 12 (twelve) hours.   prochlorperazine 10 MG tablet Commonly known as: COMPAZINE Take 1 tablet (10 mg total) by mouth every 6 (six) hours as needed (Nausea or vomiting).   protein supplement shake Liqd Commonly  known as: PREMIER PROTEIN Take 11 oz by mouth daily.        Diagnostic Studies: DG FEMUR, MIN 2 VIEWS RIGHT  Result Date: 01/01/2023 CLINICAL DATA:  Right intramedullary nail EXAM: RIGHT FEMUR 2 VIEWS COMPARISON:  None Available. FINDINGS: Fluoroscopic images were obtained intraoperatively and submitted for post operative interpretation. Right intramedullary nail placement, 10 images were obtained with 1 minute and 27 seconds of fluoroscopy time and 8.23 mGy. Please see the performing provider's procedural report for further detail. IMPRESSION: Intraoperative fluoroscopic guidance for right intramedullary nail placement. Electronically Signed   By: Allegra Lai M.D.   On: 01/01/2023 14:57   DG C-Arm 1-60 Min-No Report  Result Date: 01/01/2023 Fluoroscopy was utilized by the requesting physician.  No radiographic interpretation.   DG C-Arm 1-60 Min-No Report  Result Date: 01/01/2023 Fluoroscopy was utilized by the requesting physician.  No radiographic  interpretation.   CT CHEST ABDOMEN PELVIS W CONTRAST  Result Date: 12/24/2022 CLINICAL DATA:  Non-small cell lung cancer. Assess treatment response. Initial diagnosis November 2021. Chemotherapy radiation therapy. Immunotherapy completed. * Tracking Code: BO * EXAM: CT CHEST, ABDOMEN, AND PELVIS WITH CONTRAST TECHNIQUE: Multidetector CT imaging of the chest, abdomen and pelvis was performed following the standard protocol during bolus administration of intravenous contrast. RADIATION DOSE REDUCTION: This exam was performed according to the departmental dose-optimization program which includes automated exposure control, adjustment of the mA and/or kV according to patient size and/or use of iterative reconstruction technique. CONTRAST:  98mL OMNIPAQUE IOHEXOL 300 MG/ML  SOLN COMPARISON:  03/20/2022 FINDINGS: CT CHEST FINDINGS Cardiovascular: No significant vascular findings. Normal heart size. No pericardial effusion. Mediastinum/Nodes:  Ill-defined tissue planes within the mediastinum consistent with tumor infiltrate. No change from comparison exam. No axillary supraclavicular adenopathy Lungs/Pleura: Peripheral nodules in the RIGHT lower lobe and along the RIGHT lower lobe pleural surface. For example lateral nodule in the RIGHT lower lobe along the pleural surface measures 16 mm (image 86/3 compared to 12 mm. Nodule over the RIGHT hemidiaphragm measuring 13 mm (image 104/3) compares to 11 mm. Nodule in the RIGHT middle lobe pleural surface measures 11 mm (97/3 compared to 11 mm. There is bronchiectasis and peribronchial thickening in the RIGHT lobe similar prior. Several new peripheral nodules in the LEFT lung. For example LEFT upper lobe nodule measuring 10 mm (image 91/3) compares to 6 mm. Consolidation in the LEFT lower lobe is similar prior. RIGHT upper lobe nodule measuring 6 mm compares to 5 (image 46/3 Musculoskeletal: Stable sclerotic lesions in the thoracic spine CT ABDOMEN AND PELVIS FINDINGS Hepatobiliary: No focal hepatic lesion. No biliary ductal dilatation. Gallbladder is normal. Common bile duct is normal. Pancreas: Pancreas is normal. No ductal dilatation. No pancreatic inflammation. Spleen: Normal spleen Adrenals/urinary tract: Adrenal glands and kidneys are normal. The ureters and bladder normal. Stomach/Bowel: Stomach, small bowel, appendix, and cecum are normal. The colon and rectosigmoid colon are normal. Vascular/Lymphatic: Abdominal aorta is normal caliber with atherosclerotic calcification. There is no retroperitoneal or periportal lymphadenopathy. No pelvic lymphadenopathy. Reproductive: Uterus and adnexa unremarkable. Other: No peritoneal metastasis Musculoskeletal: Multiple round sclerotic lesions in the proximal femurs, iliac bones, sacrum and lower thoracic spine are unchanged. Lytic lesion within the RIGHT femoral neck measures 2.7 cm unchanged IMPRESSION: CHEST IMPRESSION: 1. Bilateral peripheral pleural nodules are  mildly increased in size. 2. Bilateral small effusions. Stable consolidation at the LEFT lung base 3. Stable ill-defined tissue in the mediastinum suggesting treated tumor infiltrate. PELVIS IMPRESSION: 1. No evidence of metastatic disease in the abdomen pelvis. 2. Stable sclerotic metastasis in the spine and pelvis. 3. Stable lytic lesion in the RIGHT femoral neck may be at risk for pathologic fracture. 4.  Aortic Atherosclerosis (ICD10-I70.0). Electronically Signed   By: Suzy Bouchard M.D.   On: 12/24/2022 14:06   [place] CHEST (PLEURAL EFFUSION)  Result Date: 12/21/2022 CLINICAL DATA:  Pleural effusion EXAM: CHEST ULTRASOUND COMPARISON:  None Available. FINDINGS: Focused sonographic exam of the bilateral chest was performed for fluid assessment. Images demonstrate no significant pleural effusion on either the right or left side. No thoracentesis performed. IMPRESSION: No significant pleural effusion appreciated by ultrasound on either side. No thoracentesis performed. Electronically Signed   By: Albin Felling M.D.   On: 12/21/2022 12:32   DG Chest 2 View  Result Date: 12/21/2022 CLINICAL DATA:  History of lung cancer with several week history of shortness of breath EXAM: CHEST -  2 VIEW COMPARISON:  CT chest dated 10/12/2022 chest radiograph dated 12/30/2020 FINDINGS: Slightly low lung volumes. Persistent right apical opacity and asymmetric patchy right lung opacities. Multifocal right lung nodules are better evaluated on prior chest CT. New peripheral left upper lung nodular opacity. Small to moderate right pleural effusion. Trace left pleural effusion. The heart size and mediastinal contours are within normal limits. The visualized skeletal structures are unremarkable. IMPRESSION: 1. Persistent right apical opacity and asymmetric patchy right lung opacities. Multifocal right lung nodules are better evaluated on prior chest CT. 2. New peripheral left upper lung nodular opacity may represent a focus of  infection or metastasis. 3. Small to moderate right pleural effusion. Trace left pleural effusion. Electronically Signed   By: Agustin Cree M.D.   On: 12/21/2022 09:03   CT Lumbar Spine W Contrast  Result Date: 12/06/2022 CLINICAL DATA:  Right flank and leg pain.  History of lung cancer EXAM: CT LUMBAR SPINE WITH CONTRAST TECHNIQUE: Multidetector CT imaging of the lumbar spine was performed with intravenous contrast administration. RADIATION DOSE REDUCTION: This exam was performed according to the departmental dose-optimization program which includes automated exposure control, adjustment of the mA and/or kV according to patient size and/or use of iterative reconstruction technique. CONTRAST:  94mL OMNIPAQUE IOHEXOL 350 MG/ML SOLN COMPARISON:  MRI lumbar spine 06/08/2022. CT abdomen pelvis 10/12/2022 FINDINGS: Segmentation: Normal segmentation. Five lumbar vertebra. Lowest disc space L5-S1 Alignment: Mild anterolisthesis L5-S1 otherwise normal alignment Vertebrae: Negative for fracture Numerous sclerotic lesions throughout the lumbar spine as well as the sacrum and iliac bones, stable from the prior CT and MRI. Findings compatible with stable metastatic disease. No new lesions identified. Paraspinal and other soft tissues: Atherosclerotic aorta without aneurysm. No retroperitoneal mass or adenopathy. Subcentimeter left periaortic lymph nodes stable from the prior CT. Disc levels: L1-2: Mild degenerative change.  Negative for stenosis L2-3: Mild disc bulging and mild facet degeneration. Negative for stenosis L3-4: Diffuse bulging of the disc with borderline spinal stenosis. Subarticular stenosis on the right L4-5: Diffuse bulging of the disc and bilateral facet degeneration. Borderline spinal stenosis. Mild subarticular stenosis bilaterally L5-S1: Disc bulging and facet degeneration. Moderate right foraminal narrowing due to spurring and disc bulging. Mild left foraminal narrowing. IMPRESSION: 1. Numerous sclerotic  lesions throughout the lumbar spine and pelvis compatible with metastatic disease. These are stable from the prior CT and MRI. No new lesions. 2. Multilevel degenerative change in the lumbar spine. Borderline spinal stenosis L3-4 and L4-5. 3. Moderate right foraminal narrowing L5-S1 due to spurring and disc bulging. Mild left foraminal narrowing L5-S1. 4. Aortic atherosclerosis. Aortic Atherosclerosis (ICD10-I70.0). Electronically Signed   By: Marlan Palau M.D.   On: 12/06/2022 12:52   CT FEMUR RIGHT W CONTRAST  Result Date: 12/06/2022 CLINICAL DATA:  Right lower back pain radiating to the right upper leg. EXAM: CT OF THE LOWER RIGHT EXTREMITY WITH CONTRAST TECHNIQUE: Multidetector CT imaging of the lower right extremity was performed according to the standard protocol following intravenous contrast administration. RADIATION DOSE REDUCTION: This exam was performed according to the departmental dose-optimization program which includes automated exposure control, adjustment of the mA and/or kV according to patient size and/or use of iterative reconstruction technique. CONTRAST:  36mL OMNIPAQUE IOHEXOL 350 MG/ML SOLN COMPARISON:  None Available. FINDINGS: Bones/Joint/Cartilage There is a lytic lesion with endosteal scalloping in the intertrochanteric region measuring approximately 2.1 x 2.4 x 3.3 cm. Few other sclerotic lesions in the pelvis and visualized femur the largest in the mid to distal  femoral diaphysis measuring at least 1.0 x 0.7 x 1.6 cm. Ligaments Suboptimally assessed by CT. Muscles and Tendons Muscles are normal in bulk. No intramuscular hematoma or fluid collection. Soft tissues Skin and subcutaneous soft tissues are within normal limits. IMPRESSION: 1. Large lytic lesion with endosteal scalloping in the intertrochanteric region of the right femur measuring at least 2.1 x 2.4 x 3.3 cm CT examination dated October 12, 2022. There is increased risk for a pathological fracture of the femur. 2. Few  other sclerotic lesions in the pelvis and visualized femur. Findings are concerning for metastatic disease. Recommend further evaluation with bone scan. 3. Muscles and subcutaneous soft tissues are within normal limits. Electronically Signed   By: Larose Hires D.O.   On: 12/06/2022 11:06    Disposition: Discharge disposition: 01-Home or Self Care       Discharge Instructions     Face-to-face encounter (required for Medicare/Medicaid patients)   Complete by: As directed    I Altamese Cabal certify that this patient is under my care and that I, or a nurse practitioner or physician's assistant working with me, had a face-to-face encounter that meets the physician face-to-face encounter requirements with this patient on 01/02/2023. The encounter with the patient was in whole, or in part for the following medical condition(s) which is the primary reason for home health care (List medical condition): s/p IM nail   The encounter with the patient was in whole, or in part, for the following medical condition, which is the primary reason for home health care: s/p IM nail   I certify that, based on my findings, the following services are medically necessary home health services: Physical therapy   Reason for Medically Necessary Home Health Services: Therapy- Investment banker, operational, Patent examiner   My clinical findings support the need for the above services: Pain interferes with ambulation/mobility   Further, I certify that my clinical findings support that this patient is homebound due to: Ambulates short distances less than 300 feet   Home Health   Complete by: As directed    To provide the following care/treatments: PT          Signed: Altamese Cabal ,PA-C 01/02/2023, 6:27 AM

## 2023-01-02 NOTE — Discharge Instructions (Signed)
No tight elastic waist bands over the bandage. Not wearing underwear is preferred, or pull waist band above the bandage.  May shower with bandage in place.  If bandage becomes saturated, OK to removal and place band-aid.  You may be up walking around as tolerated but should take periodic breaks to elevate your legs.    Pain medication can cause constipation.  You should increase your fluid intake, increase your intake of high fiber foods and/or take Metamucil as needed for constipation.  You may shower.  You do NOT need to cover the dressing or incision site with plastic wrap.  The dressing or incision can get wet, but do not submerge under water.  Continue your physical therapy exercises at least twice daily.  It is a good idea to use an ice pack for 30 minutes after doing your exercises to reduce swelling.  Do not be surprised if you have increased pain at night.  This usually means you have been a little too active during the day and need to reduce your activities.  If you develop lower extremity swelling that does not improve after a night of elevation, please call the office.  This could be an early sign of a blood clot.  Call with questions, fever>101.5 degrees, shortness of breath or drainage from the wound  [phone]

## 2023-01-02 NOTE — Anesthesia Postprocedure Evaluation (Signed)
Anesthesia Post Note  Patient: Brittney Fisher  Procedure(s) Performed: TREATMENT, INTERTROCHANTERIC FEMORAL FRACTURE, WITH INTRAMEDULLARY IMPLANT (SURG) (Right: Hip)  Patient location during evaluation: PACU Anesthesia Type: Spinal Level of consciousness: awake and alert, oriented and patient cooperative Pain management: pain level controlled Vital Signs Assessment: post-procedure vital signs reviewed and stable Respiratory status: spontaneous breathing, nonlabored ventilation and respiratory function stable Cardiovascular status: blood pressure returned to baseline and stable Postop Assessment: adequate PO intake, spinal receding, no headache and no backache Anesthetic complications: no   No notable events documented.   Last Vitals:  Vitals:   01/02/23 1215 01/02/23 1315  BP:  91/61  Pulse: (!) 127 (!) 113  Resp:  18  Temp:  37.2 C  SpO2: 91% 94%    Last Pain:  Vitals:   01/02/23 1315  TempSrc: Temporal  PainSc: 1                  Reed Breech

## 2023-01-02 NOTE — TOC Progression Note (Addendum)
Transition of Care [facility]) - Progression Note    Patient Details  Name: Brittney Fisher MRN: [id number] Date of Birth: [birth year]  Transition of Care [facility]) CM/SW Contact  Marlowe Sax, RN Phone Number: 01/02/2023, 12:23 PM  Clinical Narrative:   The patient lives at home with Parents, She has DME at home, I reached out top [facility] to see if they could accept the patient for [facility] services They will run it and let me know  They are not able to accept the patient  I reached out to [facility] to see if they could accept She will get a 3 in 1 delivered to her house By [facility]   Update, Dr wants to have 3 in 1 delivered here prior to DC, I notified [employer] they will deliver here    Expected Discharge Plan: Home/Self Care Barriers to Discharge: No Barriers Identified  Expected Discharge Plan and Services   Discharge Planning Services: CM Consult   Living arrangements for the past 2 months: Single Family Home Expected Discharge Date: 01/02/23                                     Social Determinants of Health (SDOH) Interventions SDOH Screenings   Food Insecurity: No Food Insecurity (01/01/2023)  Housing: Low Risk  (01/01/2023)  Transportation Needs: No Transportation Needs (01/01/2023)  Utilities: Not At Risk (01/01/2023)  Tobacco Use: Medium Risk (01/01/2023)    Readmission Risk Interventions     No data to display

## 2023-01-02 NOTE — Progress Notes (Signed)
MD aware of vital signs and notes from PT. Patient states she wants to go home. Per MD okay to discharge patient home.

## 2023-01-02 NOTE — Progress Notes (Signed)
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 Postoperatively pt is WBAT per Dr. Odis Luster. PMH: COPD. Pt followed by [facility]/PMT.    PT Comments    Pt curled up in hooklying Left partial sidelying, pillow between knees, reportedly pain got worse earlier, she then got pain meds and then tried to use therapeutic rest for pain control. Pt assisted back to short sitting, vitals established: 120s/70smmHg seated HR 117bpm, SpO2 90%. Pt again performs transfer from recliner, later from toilet riser, intermittent cues for hand placement. Pt seems more forgetful on pain meds, impulsively trying to protect IV line as it interfaces with walker, whereas pt is reminded a few times line safety is in custody of author and accounted for. Pt assisted to BR for voiding, then after balance with hand hygiene at sink, AMB 162ft in hall and back to recliner. He gait appears more antalgic compared to this morning, but she has adequate UE strength to make up the difference. Pt set up for meal tray. Niece/HCPOA here now visiting, plans to provide transport at DC. Updated appropriate team members regarding vitals response to this treatment session (RN, MD, PA). Reached out to [facility] and PMT rep [employer] regarding services and DME for DC. Pt reports confidence in being able to safely DC to home today.    Recommendations for follow up therapy are one component of a multi-disciplinary discharge planning process, led by the attending physician.  Recommendations may be updated based on patient status, additional functional criteria and insurance authorization.  Follow Up Recommendations  Home health PT     Assistance Recommended at Discharge Set up Supervision/Assistance   Patient can return home with the following A little help with walking and/or transfers;A little help with bathing/dressing/bathroom;Assistance with cooking/housework;Assist for transportation;Help with stairs or ramp for entrance   Equipment Recommendations  BSC/3in1 (pt hsa grab bars but found this height more comfortable given her tall stature adn toilet height at home.)    Recommendations for Other Services       Precautions / Restrictions Precautions Precautions: Fall Restrictions Weight Bearing Restrictions: Yes RLE Weight Bearing: Weight bearing as tolerated     Mobility  Bed Mobility Overal bed mobility: Independent             General bed mobility comments: up in recliner at beginning/EOS    Transfers Overall transfer level: Needs assistance Equipment used: Rolling walker (2 wheels) Transfers: Sit to/from Stand Sit to Stand: Supervision           General transfer comment: seems a little more loopy, may be related to pain meds since last seen, essentially WNL.    Ambulation/Gait Ambulation/Gait assistance: Supervision, Min guard Gait Distance (Feet): 120 Feet Assistive device: Rolling walker (2 wheels) Gait Pattern/deviations: Step-through pattern       General Gait Details: movings fairly well considering pain levels, concerted breathing efforts periodiclaly to help with dyspnea. Pt albe to AMB with more consistent pacing and fewer recovery breaks as disctated by dyspnea. HR at 89ft 123bpm   Stairs             Wheelchair Mobility    Modified Rankin (Stroke Patients Only)       Balance Overall balance assessment: Modified Independent, Mild  deficits observed, not formally tested                                          Cognition Arousal/Alertness: Awake/alert Behavior During Therapy: WFL for tasks assessed/performed Overall Cognitive Status: Within Functional Limits for tasks assessed                                           Exercises      General Comments        Pertinent Vitals/Pain Pain Assessment Pain Assessment: 0-10 Pain Score: 9  Pain Location: Operative  leg Pain Intervention(s): Limited activity within patient's tolerance, Monitored during session, Premedicated before session, Repositioned    Home Living Family/patient expects to be discharged to:: Private residence Living Arrangements: Parent Available Help at Discharge: Family;Available 24 hours/day (lots of support, frequent check ins from extended family) Type of Home: House Home Access: Stairs to enter   [employer] of Steps: 2   Home Layout: One level Home Equipment: Agricultural consultant (2 wheels);Grab bars - toilet      Prior Function            PT Goals (current goals can now be found in the care plan section) Acute Rehab PT Goals Patient Stated Goal: return to home, return to AMB ad lib without concern of pathological fracture progression PT Goal Formulation: With patient Time For Goal Achievement: 01/16/23 Potential to Achieve Goals: Good Progress towards PT goals: Progressing toward goals    Frequency    BID      PT Plan Current plan remains appropriate;Equipment recommendations need to be updated    Co-evaluation              AM-PAC PT "6 Clicks" Mobility   Outcome Measure  Help needed turning from your back to your side while in a flat bed without using bedrails?: None Help needed moving from lying on your back to sitting on the side of a flat bed without using bedrails?: None Help needed moving to and from a bed to a chair (including a wheelchair)?: None Help needed standing up from a chair using your arms (e.g., wheelchair or bedside chair)?: None Help needed to walk in hospital room?: A Little Help needed climbing 3-5 steps with a railing? : A Little 6 Click Score: 22    End of Session Equipment Utilized During Treatment: Gait belt Activity Tolerance:  Treatment limited secondary to medical complications (Comment) Patient left: in chair;with call bell/phone within reach Nurse Communication: Mobility status PT Visit Diagnosis: Difficulty in walking, not elsewhere classified (R26.2);Other abnormalities of gait and mobility (R26.89)     Time: 1140-1210 PT Time Calculation (min) (ACUTE ONLY): 30 min  Charges:  $Gait Training: 8-22 mins $Therapeutic Activity: 8-22 mins                    12:44 PM, 01/02/23 Rosamaria Lints, PT, DPT Physical Therapist - [facility]  [phone] (ASCOM)    Manford Sprong C 01/02/2023, 12:27 PM

## 2023-01-02 NOTE — Evaluation (Addendum)
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 Postoperatively pt is WBAT per Dr. Odis Luster. PMH: COPD. Pt followed by [facility]/PMT.  Clinical Impression  Pt in bed on entry, awake and agreeable to session. Pt reports adequate pain control, appears to be moving very well compared to typical course on POD1 for this procedure, moves to EOB without any apparent pain limitations. Pt educated on use of RW for transfers from EOB and from recliner, no physical assist needed for either. Pt has persistent dyspnea once at EOB, then intermittently worse while attempting AMB in room/hall, gait speed not significant enough to impose enough metabolic equivalents to explain dyspnea and HR in 130s. Pt denies any frank dizziness, lightheadedness. AMB cut short to prioritize full set of vitals. Additional AMB deferred until vitals discussed with orthopedic team. Pt has multiple risk factors for acute DVT/PE, author exercising caution with exerting activity given her vitals and activity response. Pt up to chair at end of session, appears comfortable. Will return in PM for 2nd PT session and reassess vitals and mobility at that time. MD and PA made are, author awaiting a more detailed response. RN also aware.       01/02/23 0958  Therapy Vitals  Pulse Rate (!) 128  BP 112/60  Patient Position (if appropriate) Orthostatic Vitals  Orthostatic Sitting  BP- Sitting 112/60  Pulse- Sitting 128  Orthostatic Standing at 0 minutes  BP- Standing at 0 minutes 102/65  Pulse- Standing at 0 minutes 127  Orthostatic Standing at 3 minutes  BP- Standing at 3 minutes 120/64  Pulse- Standing at 3 minutes 135  Oxygen Therapy  SpO2 91 %  O2 Device Room Air       Recommendations for follow up therapy are one component of a multi-disciplinary discharge planning process, led by the attending physician.  Recommendations may be updated based on patient status, additional functional criteria and insurance authorization.  Follow Up Recommendations Home health PT (followed by [facility], unclear how this affects avalable services and DME at DC)      Assistance Recommended at Discharge Set up Supervision/Assistance  Patient can return home with the following  A little help with walking and/or transfers;A little help with bathing/dressing/bathroom;Assistance with cooking/housework;Assist for transportation;Help with stairs or ramp for entrance    Equipment Recommendations None recommended by PT  Recommendations for Other Services       Functional Status Assessment Patient has had a recent decline in their functional status and demonstrates the ability to make significant improvements in function in a reasonable and predictable amount of time.     Precautions / Restrictions Precautions Precautions: Fall Restrictions Weight Bearing Restrictions: Yes RLE Weight Bearing: Weight bearing as tolerated      Mobility  Bed Mobility Overal bed mobility: Independent                  Transfers Overall transfer level: Needs assistance Equipment used: Rolling walker (2 wheels) Transfers: Sit to/from Stand Sit to Stand: Supervision                Ambulation/Gait Ambulation/Gait assistance: Supervision Gait Distance (Feet): 50 Feet Assistive device: Rolling walker (2 wheels) Gait Pattern/deviations: Step-to pattern       General Gait Details:  from an orthopedic standpoint, moving well with RW and use of legs; she is limited mostly by dyspnea that correlated to tachycardia into upper 130s sustained while standing/taking steps; Her SpO2 is 90-92% throughout both at rest and when exerted.  Stairs            Wheelchair Mobility     Modified Rankin (Stroke Patients Only)       Balance Overall balance assessment: Modified Independent, Mild deficits observed, not formally tested                                           Pertinent Vitals/Pain Pain Assessment Pain Assessment: No/denies pain    Home Living Family/patient expects to be discharged to:: Private residence Living Arrangements: Parent Available Help at Discharge: Family;Available 24 hours/day (lots of support, frequent check ins from extended family) Type of Home: House Home Access: Stairs to enter   [employer] of Steps: 2   Home Layout: One level Home Equipment: Agricultural consultant (2 wheels);Grab bars - toilet      Prior Function Prior Level of Function : Independent/Modified Independent             Mobility Comments: previously AMB without device, mostly household to limited community distances       Hand Dominance        Extremity/Trunk Assessment   Upper Extremity Assessment Upper Extremity Assessment: Overall WFL for tasks assessed    Lower Extremity Assessment Lower Extremity Assessment: Generalized weakness;Overall WFL for tasks assessed       Communication      Cognition Arousal/Alertness: Awake/alert Behavior During Therapy: WFL for tasks assessed/performed Overall Cognitive Status: Within Functional Limits for tasks assessed                                          General Comments      Exercises     Assessment/Plan    PT Assessment Patient needs continued PT services  PT Problem List Decreased strength;Decreased activity tolerance;Decreased balance;Decreased mobility       PT Treatment Interventions DME instruction;Gait training;Stair training;Functional mobility training;Therapeutic activities;Therapeutic exercise;Balance training;Neuromuscular re-education;Cognitive remediation;Patient/family education    PT Goals (Current goals can be found in the Care  Plan section)  Acute Rehab PT Goals Patient Stated Goal: return to home, return to AMB ad lib without concern of pathological fracture progression PT Goal Formulation: With patient Time For Goal Achievement: 01/16/23 Potential to Achieve Goals: Good    Frequency BID     Co-evaluation               AM-PAC PT "6 Clicks" Mobility  Outcome Measure Help needed turning from your back to your side while in a flat bed without using bedrails?: None Help needed moving from lying on your back to sitting on the side of a flat bed without using bedrails?: None Help needed moving to and from a bed to a chair (including a wheelchair)?: None Help needed standing up from a chair using your arms (e.g., wheelchair or bedside chair)?: None Help needed to walk in hospital room?: A Little Help needed climbing 3-5 steps with a railing? : A Little 6 Click Score: 22    End of Session Equipment Utilized During Treatment: Gait belt Activity Tolerance: Treatment limited secondary  to medical complications (Comment) Patient left: in chair;with call bell/phone within reach Nurse Communication: Mobility status (elevated HR and dyspnea with mobility) PT Visit Diagnosis: Difficulty in walking, not elsewhere classified (R26.2);Other abnormalities of gait and mobility (R26.89)    Time: 5190-4143 PT Time Calculation (min) (ACUTE ONLY): 33 min   Charges:   PT Evaluation $PT Eval High Complexity: 1 High PT Treatments $Therapeutic Activity: 8-22 mins       10:23 AM, 01/02/23 Rosamaria Lints, PT, DPT Physical Therapist - [facility]  [phone] (ASCOM)    Deklyn Trachtenberg C 01/02/2023, 10:18 AM

## 2023-01-02 NOTE — Progress Notes (Signed)
  Subjective:  Patient reports pain as mild to moderate.    Objective:   VITALS:   Vitals:   01/01/23 1730 01/01/23 1745 01/01/23 2021 01/02/23 0400  BP: 117/81 109/72 110/68 112/64  Pulse: (!) 109 (!) 111 (!) 109 98  Resp: (!) 23  19 16   Temp: 98.2 F (36.8 C) 99.1 F (37.3 C) 99 F (37.2 C) 98.4 F (36.9 C)  TempSrc:   Temporal Oral  SpO2: 99% 96% 94% 94%  Weight:      Height:        PHYSICAL EXAM:  Neurologically intact ABD soft Neurovascular intact Sensation intact distally Intact pulses distally Dorsiflexion/Plantar flexion intact Incision: dressing C/D/I No cellulitis present Compartment soft  LABS  Results for orders placed or performed during the hospital encounter of 01/01/23 (from the past 24 hour(s))  ABO/Rh     Status: None   Collection Time: 01/01/23 11:13 AM  Result Value Ref Range   ABO/RH(D)      O POS Performed at [facility], [address] [place], [place]     DG FEMUR, MIN 2 VIEWS RIGHT  Result Date: 01/01/2023 CLINICAL DATA:  Right intramedullary nail EXAM: RIGHT FEMUR 2 VIEWS COMPARISON:  None Available. FINDINGS: Fluoroscopic images were obtained intraoperatively and submitted for post operative interpretation. Right intramedullary nail placement, 10 images were obtained with 1 minute and 27 seconds of fluoroscopy time and 8.23 mGy. Please see the performing provider's procedural report for further detail. IMPRESSION: Intraoperative fluoroscopic guidance for right intramedullary nail placement. Electronically Signed   By: 01/03/2023 M.D.   On: 01/01/2023 14:57   DG C-Arm 1-60 Min-No Report  Result Date: 01/01/2023 Fluoroscopy was utilized by the requesting physician.  No radiographic interpretation.   DG C-Arm 1-60 Min-No Report  Result Date: 01/01/2023 Fluoroscopy was utilized by the requesting physician.  No radiographic interpretation.    Assessment/Plan: 1 Day Post-Op   Principal Problem:   Impending  pathologic fracture   Advance diet Up with therapy WBAT RLE Discharge home today Follow up in office in 2 weeks (01/16/23) call office to confirm appointment with 01/18/23 PA-C [phone]   [phone] , PA-C 01/02/2023, 6:20 AM

## 2023-01-03 ENCOUNTER — Other Ambulatory Visit: Payer: Medicaid Other

## 2023-01-03 ENCOUNTER — Telehealth: Payer: Self-pay

## 2023-01-03 NOTE — Telephone Encounter (Signed)
Fax received from pharmacy that PA needed for Oxycodone Er 20 mg.  Not able to do the PA via cover my meds so called made to Medicaid ([place] tracks).  Not able to submit opioid PA over phone so PA form has been printed from [email or web address]/, completed, and pending Josh B signature.    Will submit PA via fax when signed by Josh.

## 2023-01-04 ENCOUNTER — Inpatient Hospital Stay: Payer: Medicaid Other

## 2023-01-04 ENCOUNTER — Ambulatory Visit: Payer: Medicaid Other

## 2023-01-04 LAB — SURGICAL PATHOLOGY

## 2023-01-04 NOTE — Telephone Encounter (Signed)
PA form has been faxed.

## 2023-01-07 ENCOUNTER — Encounter: Payer: Self-pay | Admitting: Oncology

## 2023-01-08 ENCOUNTER — Inpatient Hospital Stay: Payer: Medicaid Other | Admitting: Oncology

## 2023-01-10 ENCOUNTER — Encounter: Payer: Self-pay | Admitting: Hospice and Palliative Medicine

## 2023-01-10 NOTE — H&P (Signed)
NAME: Brittney Fisher MRN:   [id number] DOB:   28-May-1961     HISTORY AND PHYSICAL  CHIEF COMPLAINT:  right hip pain  HISTORY:   Brittney Fisher a 62 y.o. female  with right  Hip Pain Patient complains of right hip pain. Onset of the symptoms was several months ago. Inciting event: none. The patient reports the hip pain is worse with weight bearing. Associated symptoms: none.  Patient has stage 4 lung cancer and developed large metastatic right intertrochanteric lytic lesion.  PAST MEDICAL HISTORY:   Past Medical History:  Diagnosis Date   COPD (chronic obstructive pulmonary disease) (HCC)    GERD (gastroesophageal reflux disease)    Metastatic lung cancer (metastasis from lung to other site) ([place]) 11/02/2020    PAST SURGICAL HISTORY:   Past Surgical History:  Procedure Laterality Date   COLONOSCOPY WITH ESOPHAGOGASTRODUODENOSCOPY (EGD)     COLONOSCOPY WITH PROPOFOL N/A 11/14/2015   Procedure: COLONOSCOPY WITH PROPOFOL;  Surgeon: Hulen Luster, MD;  Location: [facility] ENDOSCOPY;  Service: Gastroenterology;  Laterality: N/A;   ESOPHAGOGASTRODUODENOSCOPY (EGD) WITH PROPOFOL N/A 11/14/2015   Procedure: ESOPHAGOGASTRODUODENOSCOPY (EGD) WITH PROPOFOL;  Surgeon: Hulen Luster, MD;  Location: [facility] ENDOSCOPY;  Service: Gastroenterology;  Laterality: N/A;   FEMUR IM NAIL Right 01/01/2023   Procedure: TREATMENT, INTERTROCHANTERIC FEMORAL FRACTURE, WITH INTRAMEDULLARY IMPLANT (SURG);  Surgeon: Lovell Sheehan, MD;  Location: [facility] ORS;  Service: Orthopedics;  Laterality: Right;    MEDICATIONS:   No medications prior to admission.    ALLERGIES:   Allergies  Allergen Reactions   Carboplatin Shortness Of Breath, Itching and Cough    REVIEW OF SYSTEMS:   Negative except HPI  FAMILY HISTORY:   Family History  Problem Relation Age of Onset   Breast cancer Cousin 55   Diabetes type II Mother    Hypertension Mother    Colon cancer Father    Hypertension Father     SOCIAL HISTORY:   reports  that she quit smoking about 15 months ago. Her smoking use included cigarettes. She has never used smokeless tobacco. She reports that she does not drink alcohol and does not use drugs.  PHYSICAL EXAM:  General appearance: alert, cooperative, and no distress Neck: no JVD and supple, symmetrical, trachea midline Resp: clear to auscultation bilaterally Cardio: regular rate and rhythm, S1, S2 normal, no murmur, click, rub or gallop GI: soft, non-tender; bowel sounds normal; no masses,  no organomegaly Extremities: extremities normal, atraumatic, no cyanosis or edema and Homans sign is negative, no sign of DVT Pulses: 2+ and symmetric    LABORATORY STUDIES: No results for input(s): "WBC", "HGB", "HCT", "PLT" in the last 72 hours.  No results for input(s): "NA", "K", "CL", "CO2", "GLUCOSE", "BUN", "CREATININE", "CALCIUM" in the last 72 hours.  STUDIES/RESULTS:  DG FEMUR, MIN 2 VIEWS RIGHT  Result Date: 01/01/2023 CLINICAL DATA:  Right intramedullary nail EXAM: RIGHT FEMUR 2 VIEWS COMPARISON:  None Available. FINDINGS: Fluoroscopic images were obtained intraoperatively and submitted for post operative interpretation. Right intramedullary nail placement, 10 images were obtained with 1 minute and 27 seconds of fluoroscopy time and 8.23 mGy. Please see the performing provider's procedural report for further detail. IMPRESSION: Intraoperative fluoroscopic guidance for right intramedullary nail placement. Electronically Signed   By: Yetta Glassman M.D.   On: 01/01/2023 14:57   DG C-Arm 1-60 Min-No Report  Result Date: 01/01/2023 Fluoroscopy was utilized by the requesting physician.  No radiographic interpretation.   DG C-Arm 1-60 Min-No Report  Result Date: 01/01/2023 Fluoroscopy was utilized by the requesting physician.  No radiographic interpretation.   CT CHEST ABDOMEN PELVIS W CONTRAST  Result Date: 12/24/2022 CLINICAL DATA:  Non-small cell lung cancer. Assess treatment response. Initial  diagnosis November 2021. Chemotherapy radiation therapy. Immunotherapy completed. * Tracking Code: BO * EXAM: CT CHEST, ABDOMEN, AND PELVIS WITH CONTRAST TECHNIQUE: Multidetector CT imaging of the chest, abdomen and pelvis was performed following the standard protocol during bolus administration of intravenous contrast. RADIATION DOSE REDUCTION: This exam was performed according to the departmental dose-optimization program which includes automated exposure control, adjustment of the mA and/or kV according to patient size and/or use of iterative reconstruction technique. CONTRAST:  39mL OMNIPAQUE IOHEXOL 300 MG/ML  SOLN COMPARISON:  03/20/2022 FINDINGS: CT CHEST FINDINGS Cardiovascular: No significant vascular findings. Normal heart size. No pericardial effusion. Mediastinum/Nodes: Ill-defined tissue planes within the mediastinum consistent with tumor infiltrate. No change from comparison exam. No axillary supraclavicular adenopathy Lungs/Pleura: Peripheral nodules in the RIGHT lower lobe and along the RIGHT lower lobe pleural surface. For example lateral nodule in the RIGHT lower lobe along the pleural surface measures 16 mm (image 86/3 compared to 12 mm. Nodule over the RIGHT hemidiaphragm measuring 13 mm (image 104/3) compares to 11 mm. Nodule in the RIGHT middle lobe pleural surface measures 11 mm (97/3 compared to 11 mm. There is bronchiectasis and peribronchial thickening in the RIGHT lobe similar prior. Several new peripheral nodules in the LEFT lung. For example LEFT upper lobe nodule measuring 10 mm (image 91/3) compares to 6 mm. Consolidation in the LEFT lower lobe is similar prior. RIGHT upper lobe nodule measuring 6 mm compares to 5 (image 46/3 Musculoskeletal: Stable sclerotic lesions in the thoracic spine CT ABDOMEN AND PELVIS FINDINGS Hepatobiliary: No focal hepatic lesion. No biliary ductal dilatation. Gallbladder is normal. Common bile duct is normal. Pancreas: Pancreas is normal. No ductal  dilatation. No pancreatic inflammation. Spleen: Normal spleen Adrenals/urinary tract: Adrenal glands and kidneys are normal. The ureters and bladder normal. Stomach/Bowel: Stomach, small bowel, appendix, and cecum are normal. The colon and rectosigmoid colon are normal. Vascular/Lymphatic: Abdominal aorta is normal caliber with atherosclerotic calcification. There is no retroperitoneal or periportal lymphadenopathy. No pelvic lymphadenopathy. Reproductive: Uterus and adnexa unremarkable. Other: No peritoneal metastasis Musculoskeletal: Multiple round sclerotic lesions in the proximal femurs, iliac bones, sacrum and lower thoracic spine are unchanged. Lytic lesion within the RIGHT femoral neck measures 2.7 cm unchanged IMPRESSION: CHEST IMPRESSION: 1. Bilateral peripheral pleural nodules are mildly increased in size. 2. Bilateral small effusions. Stable consolidation at the LEFT lung base 3. Stable ill-defined tissue in the mediastinum suggesting treated tumor infiltrate. PELVIS IMPRESSION: 1. No evidence of metastatic disease in the abdomen pelvis. 2. Stable sclerotic metastasis in the spine and pelvis. 3. Stable lytic lesion in the RIGHT femoral neck may be at risk for pathologic fracture. 4.  Aortic Atherosclerosis (ICD10-I70.0). Electronically Signed   By: Suzy Bouchard M.D.   On: 12/24/2022 14:06   [place] CHEST (PLEURAL EFFUSION)  Result Date: 12/21/2022 CLINICAL DATA:  Pleural effusion EXAM: CHEST ULTRASOUND COMPARISON:  None Available. FINDINGS: Focused sonographic exam of the bilateral chest was performed for fluid assessment. Images demonstrate no significant pleural effusion on either the right or left side. No thoracentesis performed. IMPRESSION: No significant pleural effusion appreciated by ultrasound on either side. No thoracentesis performed. Electronically Signed   By: Albin Felling M.D.   On: 12/21/2022 12:32   DG Chest 2 View  Result Date: 12/21/2022 CLINICAL DATA:  History  of lung cancer with  several week history of shortness of breath EXAM: CHEST - 2 VIEW COMPARISON:  CT chest dated 10/12/2022 chest radiograph dated 12/30/2020 FINDINGS: Slightly low lung volumes. Persistent right apical opacity and asymmetric patchy right lung opacities. Multifocal right lung nodules are better evaluated on prior chest CT. New peripheral left upper lung nodular opacity. Small to moderate right pleural effusion. Trace left pleural effusion. The heart size and mediastinal contours are within normal limits. The visualized skeletal structures are unremarkable. IMPRESSION: 1. Persistent right apical opacity and asymmetric patchy right lung opacities. Multifocal right lung nodules are better evaluated on prior chest CT. 2. New peripheral left upper lung nodular opacity may represent a focus of infection or metastasis. 3. Small to moderate right pleural effusion. Trace left pleural effusion. Electronically Signed   By: Darrin Nipper M.D.   On: 12/21/2022 09:03    ASSESSMENT:  Right hip lytic lesion        Principal Problem:   Impending pathologic fracture    PLAN:  Left hip IM nail   Carlynn Spry 01/10/2023. 10:03 AM

## 2023-01-11 ENCOUNTER — Emergency Department: Payer: Medicaid Other

## 2023-01-11 ENCOUNTER — Inpatient Hospital Stay
Admission: EM | Admit: 2023-01-11 | Discharge: 2023-01-18 | DRG: 191 | Disposition: A | Discharge status: Home / Self Care | Payer: Medicaid Other | Attending: Internal Medicine | Admitting: Internal Medicine

## 2023-01-11 ENCOUNTER — Other Ambulatory Visit: Payer: Self-pay

## 2023-01-11 DIAGNOSIS — M84551D Pathological fracture in neoplastic disease, right femur, subsequent encounter for fracture with routine healing: Secondary | ICD-10-CM | POA: Diagnosis not present

## 2023-01-11 DIAGNOSIS — Z803 Family history of malignant neoplasm of breast: Secondary | ICD-10-CM

## 2023-01-11 DIAGNOSIS — C3490 Malignant neoplasm of unspecified part of unspecified bronchus or lung: Secondary | ICD-10-CM | POA: Diagnosis not present

## 2023-01-11 DIAGNOSIS — R64 Cachexia: Secondary | ICD-10-CM | POA: Diagnosis present

## 2023-01-11 DIAGNOSIS — F419 Anxiety disorder, unspecified: Secondary | ICD-10-CM | POA: Diagnosis present

## 2023-01-11 DIAGNOSIS — Z87891 Personal history of nicotine dependence: Secondary | ICD-10-CM

## 2023-01-11 DIAGNOSIS — J910 Malignant pleural effusion: Secondary | ICD-10-CM | POA: Diagnosis present

## 2023-01-11 DIAGNOSIS — E876 Hypokalemia: Secondary | ICD-10-CM | POA: Diagnosis not present

## 2023-01-11 DIAGNOSIS — K219 Gastro-esophageal reflux disease without esophagitis: Secondary | ICD-10-CM | POA: Diagnosis present

## 2023-01-11 DIAGNOSIS — R000 Tachycardia, unspecified: Secondary | ICD-10-CM | POA: Diagnosis present

## 2023-01-11 DIAGNOSIS — T402X5A Adverse effect of other opioids, initial encounter: Secondary | ICD-10-CM | POA: Diagnosis present

## 2023-01-11 DIAGNOSIS — Z79891 Long term (current) use of opiate analgesic: Secondary | ICD-10-CM

## 2023-01-11 DIAGNOSIS — C3411 Malignant neoplasm of upper lobe, right bronchus or lung: Secondary | ICD-10-CM | POA: Diagnosis present

## 2023-01-11 DIAGNOSIS — Z681 Body mass index (BMI) 19 or less, adult: Secondary | ICD-10-CM | POA: Diagnosis not present

## 2023-01-11 DIAGNOSIS — R0603 Acute respiratory distress: Secondary | ICD-10-CM | POA: Diagnosis not present

## 2023-01-11 DIAGNOSIS — E860 Dehydration: Secondary | ICD-10-CM | POA: Diagnosis present

## 2023-01-11 DIAGNOSIS — C7951 Secondary malignant neoplasm of bone: Secondary | ICD-10-CM | POA: Diagnosis present

## 2023-01-11 DIAGNOSIS — E871 Hypo-osmolality and hyponatremia: Secondary | ICD-10-CM | POA: Diagnosis not present

## 2023-01-11 DIAGNOSIS — R0600 Dyspnea, unspecified: Secondary | ICD-10-CM

## 2023-01-11 DIAGNOSIS — R1084 Generalized abdominal pain: Secondary | ICD-10-CM | POA: Diagnosis present

## 2023-01-11 DIAGNOSIS — I517 Cardiomegaly: Secondary | ICD-10-CM | POA: Diagnosis present

## 2023-01-11 DIAGNOSIS — Z8249 Family history of ischemic heart disease and other diseases of the circulatory system: Secondary | ICD-10-CM

## 2023-01-11 DIAGNOSIS — Z888 Allergy status to other drugs, medicaments and biological substances status: Secondary | ICD-10-CM

## 2023-01-11 DIAGNOSIS — Z515 Encounter for palliative care: Secondary | ICD-10-CM

## 2023-01-11 DIAGNOSIS — J9610 Chronic respiratory failure, unspecified whether with hypoxia or hypercapnia: Secondary | ICD-10-CM | POA: Diagnosis present

## 2023-01-11 DIAGNOSIS — R112 Nausea with vomiting, unspecified: Secondary | ICD-10-CM | POA: Diagnosis present

## 2023-01-11 DIAGNOSIS — K828 Other specified diseases of gallbladder: Secondary | ICD-10-CM | POA: Diagnosis present

## 2023-01-11 DIAGNOSIS — G629 Polyneuropathy, unspecified: Secondary | ICD-10-CM

## 2023-01-11 DIAGNOSIS — R8290 Unspecified abnormal findings in urine: Secondary | ICD-10-CM | POA: Diagnosis present

## 2023-01-11 DIAGNOSIS — G893 Neoplasm related pain (acute) (chronic): Secondary | ICD-10-CM | POA: Diagnosis present

## 2023-01-11 DIAGNOSIS — C782 Secondary malignant neoplasm of pleura: Secondary | ICD-10-CM | POA: Diagnosis present

## 2023-01-11 DIAGNOSIS — K5903 Drug induced constipation: Secondary | ICD-10-CM | POA: Diagnosis present

## 2023-01-11 DIAGNOSIS — Z66 Do not resuscitate: Secondary | ICD-10-CM | POA: Diagnosis present

## 2023-01-11 DIAGNOSIS — J441 Chronic obstructive pulmonary disease with (acute) exacerbation: Principal | ICD-10-CM | POA: Diagnosis present

## 2023-01-11 DIAGNOSIS — M84451A Pathological fracture, right femur, initial encounter for fracture: Secondary | ICD-10-CM | POA: Diagnosis present

## 2023-01-11 DIAGNOSIS — M84451D Pathological fracture, right femur, subsequent encounter for fracture with routine healing: Secondary | ICD-10-CM | POA: Diagnosis present

## 2023-01-11 DIAGNOSIS — M549 Dorsalgia, unspecified: Secondary | ICD-10-CM | POA: Diagnosis present

## 2023-01-11 DIAGNOSIS — Z800 Family history of malignant neoplasm of digestive organs: Secondary | ICD-10-CM

## 2023-01-11 DIAGNOSIS — Z79899 Other long term (current) drug therapy: Secondary | ICD-10-CM

## 2023-01-11 DIAGNOSIS — Z833 Family history of diabetes mellitus: Secondary | ICD-10-CM

## 2023-01-11 LAB — URINALYSIS, ROUTINE W REFLEX MICROSCOPIC
Bacteria, UA: NONE SEEN
Glucose, UA: NEGATIVE mg/dL
Hgb urine dipstick: NEGATIVE
Ketones, ur: 5 mg/dL — AB
Nitrite: NEGATIVE
Protein, ur: 30 mg/dL — AB
Specific Gravity, Urine: 1.038 — ABNORMAL HIGH (ref 1.005–1.030)
pH: 5 (ref 5.0–8.0)

## 2023-01-11 LAB — CBC
HCT: 38.9 % (ref 36.0–46.0)
Hemoglobin: 12.1 g/dL (ref 12.0–15.0)
MCH: 27.5 pg (ref 26.0–34.0)
MCHC: 31.1 g/dL (ref 30.0–36.0)
MCV: 88.4 fL (ref 80.0–100.0)
Platelets: 245 10*3/uL (ref 150–400)
RBC: 4.4 MIL/uL (ref 3.87–5.11)
RDW: 15 % (ref 11.5–15.5)
WBC: 7.4 10*3/uL (ref 4.0–10.5)
nRBC: 0 % (ref 0.0–0.2)

## 2023-01-11 LAB — COMPREHENSIVE METABOLIC PANEL
ALT: 10 U/L (ref 0–44)
AST: 17 U/L (ref 15–41)
Albumin: 3.3 g/dL — ABNORMAL LOW (ref 3.5–5.0)
Alkaline Phosphatase: 58 U/L (ref 38–126)
Anion gap: 16 — ABNORMAL HIGH (ref 5–15)
BUN: 14 mg/dL (ref 8–23)
CO2: 18 mmol/L — ABNORMAL LOW (ref 22–32)
Calcium: 9.5 mg/dL (ref 8.9–10.3)
Chloride: 99 mmol/L (ref 98–111)
Creatinine, Ser: 0.65 mg/dL (ref 0.44–1.00)
GFR, Estimated: >60 mL/min (ref >60)
Glucose, Bld: 91 mg/dL (ref 70–99)
Potassium: 4.1 mmol/L (ref 3.5–5.1)
Sodium: 133 mmol/L — ABNORMAL LOW (ref 135–145)
Total Bilirubin: 0.8 mg/dL (ref 0.3–1.2)
Total Protein: 7.6 g/dL (ref 6.5–8.1)

## 2023-01-11 LAB — LIPASE, BLOOD: Lipase: 25 U/L (ref 11–51)

## 2023-01-11 LAB — LACTIC ACID, PLASMA: Lactic Acid, Venous: 1.1 mmol/L (ref 0.5–1.9)

## 2023-01-11 MED ORDER — MORPHINE SULFATE (PF) 4 MG/ML IV SOLN
4.0000 mg | INTRAVENOUS | Status: DC | PRN
  Administered 2023-01-11 – 2023-01-16 (×7): 4 mg via INTRAVENOUS
  Filled 2023-01-11 (×7): qty 1

## 2023-01-11 MED ORDER — METOCLOPRAMIDE HCL 5 MG/ML IJ SOLN
5.0000 mg | Freq: Once | INTRAMUSCULAR | Status: AC
  Administered 2023-01-11: 5 mg via INTRAVENOUS
  Filled 2023-01-11: qty 2

## 2023-01-11 MED ORDER — METHYLPREDNISOLONE SODIUM SUCC 125 MG IJ SOLR
60.0000 mg | Freq: Once | INTRAMUSCULAR | Status: AC
  Administered 2023-01-11: 60 mg via INTRAVENOUS
  Filled 2023-01-11: qty 2

## 2023-01-11 MED ORDER — LACTULOSE 10 GM/15ML PO SOLN
30.0000 g | Freq: Once | ORAL | Status: AC
  Administered 2023-01-11: 30 g via ORAL
  Filled 2023-01-11: qty 60

## 2023-01-11 MED ORDER — IOHEXOL 350 MG/ML SOLN
75.0000 mL | Freq: Once | INTRAVENOUS | Status: AC | PRN
  Administered 2023-01-11: 75 mL via INTRAVENOUS

## 2023-01-11 MED ORDER — SODIUM CHLORIDE 0.9 % IV SOLN: Freq: Once | INTRAVENOUS | Status: AC

## 2023-01-11 MED ORDER — SODIUM CHLORIDE 0.9 % IV BOLUS
1000.0000 mL | Freq: Once | INTRAVENOUS | Status: AC
  Administered 2023-01-11: 1000 mL via INTRAVENOUS

## 2023-01-11 MED ORDER — IPRATROPIUM-ALBUTEROL 0.5-2.5 (3) MG/3ML IN SOLN
3.0000 mL | Freq: Once | RESPIRATORY_TRACT | Status: AC
  Administered 2023-01-11: 3 mL via RESPIRATORY_TRACT

## 2023-01-11 MED ORDER — IPRATROPIUM-ALBUTEROL 0.5-2.5 (3) MG/3ML IN SOLN
3.0000 mL | Freq: Once | RESPIRATORY_TRACT | Status: AC
  Administered 2023-01-11: 3 mL via RESPIRATORY_TRACT
  Filled 2023-01-11: qty 6

## 2023-01-11 NOTE — ED Notes (Signed)
Pt gone to CT 

## 2023-01-11 NOTE — ED Triage Notes (Signed)
Pt to ED for abdominal pain x2 days. Last BM 2-3 days ago and was hard. Pt has no HX of bowel issues. Pt is on high doses of narcotics for cancer. Pt did took mirilax yesterday with no relief. Pt is CAOx4 and in no acute distress.

## 2023-01-11 NOTE — ED Provider Notes (Incomplete)
[facility] Provider Note    Event Date/Time   First MD Initiated Contact with Patient 01/11/23 Bosie Helper     (approximate)   History   Abdominal Pain   HPI  Brittney Fisher is a 62 y.o. female history of COPD as well as metastatic lung cancer adenocarcinoma presents to the ER for evaluation of shortness of breath as well as abdominal pain nausea.  Poor p.o. intake.  No measured fevers.  She is having worsening dyspnea particular with any exertion.  She used to wear oxygen as needed but has not had for quite some time.  No recent antibiotics or recent steroids.     Physical Exam   Triage Vital Signs: ED Triage Vitals  Enc Vitals Group     BP 01/11/23 1731 130/79     Pulse Rate 01/11/23 1731 (!) 134     Resp 01/11/23 1731 16     Temp 01/11/23 1731 98.3 F (36.8 C)     Temp src --      SpO2 01/11/23 1731 98 %     Weight 01/11/23 1735 133 lb 13.1 oz (60.7 kg)     Height 01/11/23 1735 5\' 11"  (1.803 m)     Head Circumference --      Peak Flow --      Pain Score 01/11/23 1735 8     Pain Loc --      Pain Edu? --      Excl. in [place]? --     Most recent vital signs: Vitals:   01/11/23 2304 01/11/23 2313  BP: 129/83 129/64  Pulse: (!) 117 (!) 123  Resp: (!) 26   Temp:    SpO2: 99% 96%     Constitutional: Alert, uncomfortable appearing. Eyes: Conjunctivae are normal.  Head: Atraumatic. Nose: No congestion/rhinnorhea. Mouth/Throat: Mucous membranes are dry  Neck: Painless ROM.  Cardiovascular:   Good peripheral circulation.  Tachycardic Respiratory: Scattered occasional wheeze.  Intermittent rhonchorous breath sounds as well. Gastrointestinal: Soft and nontender.  Musculoskeletal:  no deformity Neurologic:  MAE spontaneously. No gross focal neurologic deficits are appreciated.  Skin:  Skin is warm, dry and intact. No rash noted. Psychiatric: Mood and affect are normal. Speech and behavior are normal.    ED Results / Procedures / Treatments    Labs (all labs ordered are listed, but only abnormal results are displayed) Labs Reviewed  COMPREHENSIVE METABOLIC PANEL - Abnormal; Notable for the following components:      Result Value   Sodium 133 (*)    CO2 18 (*)    Albumin 3.3 (*)    Anion gap 16 (*)    All other components within normal limits  URINALYSIS, ROUTINE W REFLEX MICROSCOPIC - Abnormal; Notable for the following components:   Color, Urine YELLOW (*)    APPearance HAZY (*)    Specific Gravity, Urine 1.038 (*)    Bilirubin Urine MODERATE (*)    Ketones, ur 5 (*)    Protein, ur 30 (*)    Leukocytes,Ua MODERATE (*)    All other components within normal limits  LIPASE, BLOOD  CBC  LACTIC ACID, PLASMA  LACTIC ACID, PLASMA  TROPONIN I (HIGH SENSITIVITY)     EKG  ED ECG REPORT I, Merlyn Lot, the attending physician, personally viewed and interpreted this ECG.   Date: 01/11/2023  EKG Time: 17:35  Rate: 135  Rhythm: sinus  Axis: normal  Intervals: normal qt  ST&T Change: no stemi, no depressions  RADIOLOGY Please see ED Course for my review and interpretation.  I personally reviewed all radiographic images ordered to evaluate for the above acute complaints and reviewed radiology reports and findings.  These findings were personally discussed with the patient.  Please see medical record for radiology report.    PROCEDURES:  Critical Care performed: No  Procedures   MEDICATIONS ORDERED IN ED: Medications  morphine (PF) 4 MG/ML injection 4 mg (4 mg Intravenous Given 01/11/23 2023)  ipratropium-albuterol (DUONEB) 0.5-2.5 (3) MG/3ML nebulizer solution 3 mL (has no administration in time range)  ipratropium-albuterol (DUONEB) 0.5-2.5 (3) MG/3ML nebulizer solution 3 mL (has no administration in time range)  methylPREDNISolone sodium succinate (SOLU-MEDROL) 125 mg/2 mL injection 60 mg (has no administration in time range)  0.9 %  sodium chloride infusion (has no administration in time range)   lactulose (CHRONULAC) 10 GM/15ML solution 30 g (30 g Oral Given 01/11/23 1918)  sodium chloride 0.9 % bolus 1,000 mL (0 mLs Intravenous Stopped 01/11/23 2022)  metoCLOPramide (REGLAN) injection 5 mg (5 mg Intravenous Given 01/11/23 2023)  iohexol (OMNIPAQUE) 350 MG/ML injection 75 mL (75 mLs Intravenous Contrast Given 01/11/23 2046)  sodium chloride 0.9 % bolus 1,000 mL (0 mLs Intravenous Stopped 01/11/23 2314)     IMPRESSION / MDM / ASSESSMENT AND PLAN / ED COURSE  I reviewed the triage vital signs and the nursing notes.                              Differential diagnosis includes, but is not limited to, enteritis, gastritis, constipation, obstruction, metastasis, PE, COPD, pneumonia, dehydration  Patient presenting to the ER for evaluation of symptoms as described above.  Based on symptoms, risk factors and considered above differential, this presenting complaint could reflect a potentially life-threatening illness therefore the patient will be placed on continuous pulse oximetry and telemetry for monitoring.  Laboratory evaluation will be sent to evaluate for the above complaints.      Clinical Course as of 01/11/23 2356  Ludwig Clarks Jan 11, 2023  1921 CT imaging on my review and interpretation does not show any evidence of obstruction or perforation will await formal radiology report. [PR]  2233 Patient reassessed.  She is feeling improved with IV fluids.  Heart rate coming down.  I think this is all secondary to significant dehydration.  She denies any chest pain or pressure.  CT without any evidence of PE.  Patient states that she would like to be discharged if at all possible.  Will continue observe for IV hydration and signs her heart rate is improving I think that that would be a reasonable option. [PR]  6599 On review of her previous EKGs nearly all of her heart rates initially in the 110's.  She denies any discomfort or pain at this time.  [PR]    Clinical Course User Index [PR] Merlyn Lot, MD      FINAL CLINICAL IMPRESSION(S) / ED DIAGNOSES   Final diagnoses:  Generalized abdominal pain  Dyspnea, unspecified type  Tachycardia     Rx / DC Orders   ED Discharge Orders     None        Note:  This document was prepared using Dragon voice recognition software and may include unintentional dictation errors.

## 2023-01-11 NOTE — ED Notes (Addendum)
Patient ambulated with ED tech. Patient's HR reached 138 and oxygen level remained 94-95%. Patient states "I feel real short of breath." Pt workload of breathing increased. Patient assisted back into bed. MD made aware.

## 2023-01-11 NOTE — ED Provider Notes (Signed)
[facility] Provider Note    Event Date/Time   First MD Initiated Contact with Patient 01/11/23 Bosie Helper     (approximate)   History   Abdominal Pain   HPI  Brittney Fisher is a 62 y.o. female history of COPD as well as metastatic lung cancer adenocarcinoma presents to the ER for evaluation of shortness of breath as well as abdominal pain nausea.  Poor p.o. intake.  No measured fevers.  She is having worsening dyspnea particular with any exertion.  She used to wear oxygen as needed but has not had for quite some time.  No recent antibiotics or recent steroids.     Physical Exam   Triage Vital Signs: ED Triage Vitals  Enc Vitals Group     BP 01/11/23 1731 130/79     Pulse Rate 01/11/23 1731 (!) 134     Resp 01/11/23 1731 16     Temp 01/11/23 1731 98.3 F (36.8 C)     Temp src --      SpO2 01/11/23 1731 98 %     Weight 01/11/23 1735 133 lb 13.1 oz (60.7 kg)     Height 01/11/23 1735 5\' 11"  (1.803 m)     Head Circumference --      Peak Flow --      Pain Score 01/11/23 1735 8     Pain Loc --      Pain Edu? --      Excl. in [place]? --     Most recent vital signs: Vitals:   01/11/23 2315 01/12/23 0000  BP: 128/73 122/81  Pulse: (!) 123 (!) 119  Resp: 19   Temp:    SpO2: 94% 96%     Constitutional: Alert, uncomfortable appearing. Eyes: Conjunctivae are normal.  Head: Atraumatic. Nose: No congestion/rhinnorhea. Mouth/Throat: Mucous membranes are dry  Neck: Painless ROM.  Cardiovascular:   Good peripheral circulation.  Tachycardic Respiratory: Scattered occasional wheeze.  Intermittent rhonchorous breath sounds as well. Gastrointestinal: Soft and nontender.  Musculoskeletal:  no deformity Neurologic:  MAE spontaneously. No gross focal neurologic deficits are appreciated.  Skin:  Skin is warm, dry and intact. No rash noted. Psychiatric: Mood and affect are normal. Speech and behavior are normal.    ED Results / Procedures / Treatments    Labs (all labs ordered are listed, but only abnormal results are displayed) Labs Reviewed  COMPREHENSIVE METABOLIC PANEL - Abnormal; Notable for the following components:      Result Value   Sodium 133 (*)    CO2 18 (*)    Albumin 3.3 (*)    Anion gap 16 (*)    All other components within normal limits  URINALYSIS, ROUTINE W REFLEX MICROSCOPIC - Abnormal; Notable for the following components:   Color, Urine YELLOW (*)    APPearance HAZY (*)    Specific Gravity, Urine 1.038 (*)    Bilirubin Urine MODERATE (*)    Ketones, ur 5 (*)    Protein, ur 30 (*)    Leukocytes,Ua MODERATE (*)    All other components within normal limits  LIPASE, BLOOD  CBC  LACTIC ACID, PLASMA  LACTIC ACID, PLASMA  TROPONIN I (HIGH SENSITIVITY)     EKG  ED ECG REPORT I, Merlyn Lot, the attending physician, personally viewed and interpreted this ECG.   Date: 01/11/2023  EKG Time: 17:35  Rate: 135  Rhythm: sinus  Axis: normal  Intervals: normal qt  ST&T Change: no stemi, no depressions  RADIOLOGY Please see ED Course for my review and interpretation.  I personally reviewed all radiographic images ordered to evaluate for the above acute complaints and reviewed radiology reports and findings.  These findings were personally discussed with the patient.  Please see medical record for radiology report.    PROCEDURES:  Critical Care performed: No  Procedures   MEDICATIONS ORDERED IN ED: Medications  morphine (PF) 4 MG/ML injection 4 mg (4 mg Intravenous Given 01/11/23 2023)  lactulose (CHRONULAC) 10 GM/15ML solution 30 g (30 g Oral Given 01/11/23 1918)  sodium chloride 0.9 % bolus 1,000 mL (0 mLs Intravenous Stopped 01/11/23 2022)  metoCLOPramide (REGLAN) injection 5 mg (5 mg Intravenous Given 01/11/23 2023)  iohexol (OMNIPAQUE) 350 MG/ML injection 75 mL (75 mLs Intravenous Contrast Given 01/11/23 2046)  sodium chloride 0.9 % bolus 1,000 mL (0 mLs Intravenous Stopped 01/11/23 2314)   ipratropium-albuterol (DUONEB) 0.5-2.5 (3) MG/3ML nebulizer solution 3 mL (3 mLs Nebulization Given 01/11/23 2356)  ipratropium-albuterol (DUONEB) 0.5-2.5 (3) MG/3ML nebulizer solution 3 mL (3 mLs Nebulization Given 01/11/23 2357)  methylPREDNISolone sodium succinate (SOLU-MEDROL) 125 mg/2 mL injection 60 mg (60 mg Intravenous Given 01/11/23 2357)  0.9 %  sodium chloride infusion ( Intravenous New Bag/Given 01/11/23 2357)     IMPRESSION / MDM / ASSESSMENT AND PLAN / ED COURSE  I reviewed the triage vital signs and the nursing notes.                              Differential diagnosis includes, but is not limited to, enteritis, gastritis, constipation, obstruction, metastasis, PE, COPD, pneumonia, dehydration  Patient presenting to the ER for evaluation of symptoms as described above.  Based on symptoms, risk factors and considered above differential, this presenting complaint could reflect a potentially life-threatening illness therefore the patient will be placed on continuous pulse oximetry and telemetry for monitoring.  Laboratory evaluation will be sent to evaluate for the above complaints.      Clinical Course as of 01/12/23 0014  Fri Jan 11, 2023  1921 CT imaging on my review and interpretation does not show any evidence of obstruction or perforation will await formal radiology report. [PR]  2233 Patient reassessed.  She is feeling improved with IV fluids.  Heart rate coming down.  I think this is all secondary to significant dehydration.  She denies any chest pain or pressure.  CT without any evidence of PE.  Patient states that she would like to be discharged if at all possible.  Will continue observe for IV hydration and signs her heart rate is improving I think that that would be a reasonable option. [PR]  6213 On review of her previous EKGs nearly all of her heart rates initially in the 110's.  She denies any discomfort or pain at this time.  [PR]  2356 Patient reassessed.   Persistently tachycardic and became significantly tachycardic as well as tachypneic with short exertion. [PR]    Clinical Course User Index [PR] Merlyn Lot, MD      FINAL CLINICAL IMPRESSION(S) / ED DIAGNOSES   Final diagnoses:  Generalized abdominal pain  Dyspnea, unspecified type  Tachycardia     Rx / DC Orders   ED Discharge Orders     None        Note:  This document was prepared using Dragon voice recognition software and may include unintentional dictation errors.    Merlyn Lot, MD 01/12/23 [id number]

## 2023-01-12 ENCOUNTER — Inpatient Hospital Stay: Payer: Medicaid Other

## 2023-01-12 ENCOUNTER — Encounter: Payer: Self-pay | Admitting: Internal Medicine

## 2023-01-12 DIAGNOSIS — E871 Hypo-osmolality and hyponatremia: Secondary | ICD-10-CM | POA: Diagnosis present

## 2023-01-12 DIAGNOSIS — R1084 Generalized abdominal pain: Principal | ICD-10-CM | POA: Diagnosis present

## 2023-01-12 DIAGNOSIS — F419 Anxiety disorder, unspecified: Secondary | ICD-10-CM | POA: Insufficient documentation

## 2023-01-12 DIAGNOSIS — J441 Chronic obstructive pulmonary disease with (acute) exacerbation: Secondary | ICD-10-CM | POA: Diagnosis not present

## 2023-01-12 DIAGNOSIS — R8290 Unspecified abnormal findings in urine: Secondary | ICD-10-CM | POA: Diagnosis present

## 2023-01-12 DIAGNOSIS — G629 Polyneuropathy, unspecified: Secondary | ICD-10-CM

## 2023-01-12 LAB — CBC
HCT: 33.7 % — ABNORMAL LOW (ref 36.0–46.0)
Hemoglobin: 10.6 g/dL — ABNORMAL LOW (ref 12.0–15.0)
MCH: 27.4 pg (ref 26.0–34.0)
MCHC: 31.5 g/dL (ref 30.0–36.0)
MCV: 87.1 fL (ref 80.0–100.0)
Platelets: 228 10*3/uL (ref 150–400)
RBC: 3.87 MIL/uL (ref 3.87–5.11)
RDW: 15 % (ref 11.5–15.5)
WBC: 6 10*3/uL (ref 4.0–10.5)
nRBC: 0 % (ref 0.0–0.2)

## 2023-01-12 LAB — BASIC METABOLIC PANEL
Anion gap: 11 (ref 5–15)
BUN: 11 mg/dL (ref 8–23)
CO2: 22 mmol/L (ref 22–32)
Calcium: 8.2 mg/dL — ABNORMAL LOW (ref 8.9–10.3)
Chloride: 100 mmol/L (ref 98–111)
Creatinine, Ser: 0.65 mg/dL (ref 0.44–1.00)
GFR, Estimated: >60 mL/min (ref >60)
Glucose, Bld: 122 mg/dL — ABNORMAL HIGH (ref 70–99)
Potassium: 4 mmol/L (ref 3.5–5.1)
Sodium: 133 mmol/L — ABNORMAL LOW (ref 135–145)

## 2023-01-12 LAB — TROPONIN I (HIGH SENSITIVITY)
Troponin I (High Sensitivity): 5 ng/L (ref <18)
Troponin I (High Sensitivity): 5 ng/L (ref <18)

## 2023-01-12 LAB — LACTIC ACID, PLASMA: Lactic Acid, Venous: 1.2 mmol/L (ref 0.5–1.9)

## 2023-01-12 LAB — HIV ANTIBODY (ROUTINE TESTING W REFLEX): HIV Screen 4th Generation wRfx: NONREACTIVE

## 2023-01-12 MED ORDER — NALOXONE HCL 4 MG/0.1ML NA LIQD: 1.0000 | NASAL | Status: DC | PRN

## 2023-01-12 MED ORDER — PREDNISONE 20 MG PO TABS
40.0000 mg | ORAL_TABLET | Freq: Every day | ORAL | Status: DC
  Administered 2023-01-13 – 2023-01-15 (×3): 40 mg via ORAL
  Filled 2023-01-12 (×3): qty 2

## 2023-01-12 MED ORDER — POLYETHYLENE GLYCOL 3350 17 GM/SCOOP PO POWD: 17.0000 g | Freq: Every day | ORAL | Status: DC | PRN

## 2023-01-12 MED ORDER — IPRATROPIUM-ALBUTEROL 0.5-2.5 (3) MG/3ML IN SOLN
3.0000 mL | Freq: Four times a day (QID) | RESPIRATORY_TRACT | Status: DC
  Administered 2023-01-12 – 2023-01-13 (×5): 3 mL via RESPIRATORY_TRACT
  Filled 2023-01-12 (×5): qty 3

## 2023-01-12 MED ORDER — LUBIPROSTONE 24 MCG PO CAPS: 24.0000 ug | ORAL_CAPSULE | Freq: Two times a day (BID) | ORAL | Status: DC

## 2023-01-12 MED ORDER — OXYCODONE HCL ER 10 MG PO T12A
20.0000 mg | EXTENDED_RELEASE_TABLET | Freq: Two times a day (BID) | ORAL | Status: DC
  Administered 2023-01-12 – 2023-01-13 (×2): 20 mg via ORAL
  Filled 2023-01-12 (×5): qty 2

## 2023-01-12 MED ORDER — SODIUM CHLORIDE 0.9 % IV SOLN
1.0000 g | INTRAVENOUS | Status: DC
  Administered 2023-01-12: 1 g via INTRAVENOUS
  Filled 2023-01-12 (×2): qty 10

## 2023-01-12 MED ORDER — SODIUM CHLORIDE 0.9 % IV SOLN: INTRAVENOUS | Status: DC

## 2023-01-12 MED ORDER — NALOXEGOL OXALATE 25 MG PO TABS
25.0000 mg | ORAL_TABLET | Freq: Every day | ORAL | Status: DC
  Administered 2023-01-13 – 2023-01-18 (×4): 25 mg via ORAL
  Filled 2023-01-12 (×8): qty 1

## 2023-01-12 MED ORDER — OXYCODONE HCL 5 MG PO TABS
5.0000 mg | ORAL_TABLET | ORAL | Status: DC | PRN
  Administered 2023-01-17 – 2023-01-18 (×2): 10 mg via ORAL
  Filled 2023-01-12 (×3): qty 2

## 2023-01-12 MED ORDER — HYDROCOD POLI-CHLORPHE POLI ER 10-8 MG/5ML PO SUER
5.0000 mL | Freq: Two times a day (BID) | ORAL | Status: DC | PRN
  Administered 2023-01-14 – 2023-01-16 (×2): 5 mL via ORAL
  Filled 2023-01-12 (×2): qty 5

## 2023-01-12 MED ORDER — TRAZODONE HCL 50 MG PO TABS
25.0000 mg | ORAL_TABLET | Freq: Every evening | ORAL | Status: DC | PRN
  Administered 2023-01-13 – 2023-01-15 (×3): 25 mg via ORAL
  Filled 2023-01-12 (×3): qty 1

## 2023-01-12 MED ORDER — MAGNESIUM HYDROXIDE 400 MG/5ML PO SUSP: 30.0000 mL | Freq: Every day | ORAL | Status: DC | PRN

## 2023-01-12 MED ORDER — DEXAMETHASONE 4 MG PO TABS: 4.0000 mg | ORAL_TABLET | Freq: Every day | ORAL | Status: DC

## 2023-01-12 MED ORDER — PREMIER PROTEIN SHAKE
11.0000 [oz_av] | Freq: Every day | ORAL | Status: DC
  Administered 2023-01-14 – 2023-01-18 (×5): 11 [oz_av] via ORAL

## 2023-01-12 MED ORDER — ACETAMINOPHEN 500 MG PO TABS
1000.0000 mg | ORAL_TABLET | Freq: Three times a day (TID) | ORAL | Status: DC
  Administered 2023-01-12 – 2023-01-18 (×19): 1000 mg via ORAL
  Filled 2023-01-12 (×19): qty 2

## 2023-01-12 MED ORDER — GUAIFENESIN ER 600 MG PO TB12
600.0000 mg | ORAL_TABLET | Freq: Two times a day (BID) | ORAL | Status: DC
  Administered 2023-01-12 – 2023-01-18 (×13): 600 mg via ORAL
  Filled 2023-01-12 (×13): qty 1

## 2023-01-12 MED ORDER — FOLIC ACID 1 MG PO TABS
1.0000 mg | ORAL_TABLET | Freq: Every day | ORAL | Status: DC
  Administered 2023-01-12 – 2023-01-18 (×5): 1 mg via ORAL
  Filled 2023-01-12 (×6): qty 1

## 2023-01-12 MED ORDER — ENOXAPARIN SODIUM 40 MG/0.4ML IJ SOSY
40.0000 mg | PREFILLED_SYRINGE | INTRAMUSCULAR | Status: DC
  Administered 2023-01-12 – 2023-01-18 (×7): 40 mg via SUBCUTANEOUS
  Filled 2023-01-12 (×7): qty 0.4

## 2023-01-12 MED ORDER — ONDANSETRON HCL 4 MG PO TABS: 4.0000 mg | ORAL_TABLET | Freq: Four times a day (QID) | ORAL | Status: DC | PRN

## 2023-01-12 MED ORDER — BENZONATATE 100 MG PO CAPS
100.0000 mg | ORAL_CAPSULE | Freq: Three times a day (TID) | ORAL | Status: DC | PRN
  Administered 2023-01-14: 100 mg via ORAL
  Filled 2023-01-12: qty 1

## 2023-01-12 MED ORDER — ACETAMINOPHEN 650 MG RE SUPP: 650.0000 mg | Freq: Four times a day (QID) | RECTAL | Status: DC | PRN

## 2023-01-12 MED ORDER — POLYETHYLENE GLYCOL 3350 17 G PO PACK: 17.0000 g | PACK | Freq: Every day | ORAL | Status: DC | PRN

## 2023-01-12 MED ORDER — ACETAMINOPHEN 325 MG PO TABS
650.0000 mg | ORAL_TABLET | Freq: Four times a day (QID) | ORAL | Status: DC | PRN
  Administered 2023-01-12: 650 mg via ORAL
  Filled 2023-01-12: qty 2

## 2023-01-12 MED ORDER — SODIUM CHLORIDE 0.9 % IV SOLN
1.0000 g | INTRAVENOUS | Status: AC
  Administered 2023-01-12 – 2023-01-15 (×4): 1 g via INTRAVENOUS
  Filled 2023-01-12 (×3): qty 10
  Filled 2023-01-12: qty 1

## 2023-01-12 MED ORDER — METHYLPREDNISOLONE SODIUM SUCC 40 MG IJ SOLR
40.0000 mg | Freq: Two times a day (BID) | INTRAMUSCULAR | Status: AC
  Administered 2023-01-12 (×2): 40 mg via INTRAVENOUS
  Filled 2023-01-12 (×2): qty 1

## 2023-01-12 MED ORDER — SOTORASIB 120 MG PO TABS: 960.0000 mg | ORAL_TABLET | Freq: Every day | ORAL | Status: DC

## 2023-01-12 MED ORDER — PSYLLIUM 95 % PO PACK
1.0000 | PACK | Freq: Every day | ORAL | Status: DC
  Administered 2023-01-12 – 2023-01-17 (×6): 1 via ORAL
  Filled 2023-01-12 (×9): qty 1

## 2023-01-12 MED ORDER — GABAPENTIN 100 MG PO CAPS
100.0000 mg | ORAL_CAPSULE | Freq: Three times a day (TID) | ORAL | Status: DC
  Administered 2023-01-12 – 2023-01-13 (×6): 100 mg via ORAL
  Filled 2023-01-12 (×12): qty 1

## 2023-01-12 MED ORDER — ONDANSETRON HCL 4 MG/2ML IJ SOLN
4.0000 mg | Freq: Four times a day (QID) | INTRAMUSCULAR | Status: DC | PRN
  Administered 2023-01-15: 4 mg via INTRAVENOUS
  Filled 2023-01-12: qty 2

## 2023-01-12 NOTE — Hospital Course (Signed)
Brittney Fisher is a 62 y.o. female with medical history significant for COPD, GERD and metastatic lung cancer, who presented to the ED on evening of 01/11/2023 for evaluation of worsening dyspnea over a few days with associated cough productive of greenish sputum, and wheezing.  She also reported having nausea and vomiting with poor p.o. intake and ]abdominal pain.  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  No UTI symptoms.   ED Course: HR 113 initially, EKG sinus tachycardia.   Labs with mild hyponatremia and CO2 of 18 with anion gap of 16 and albumin 3.3 with otherwise unremarkable CMP.   Lactic acid normal x 2.  Hs-troponin normal x 2.   CT abdomen pelvis was essentially unremarkable but did show significant stool burden, distended gallbladder and anasarca and changes in lung bases consistent with her known lung cancer.    She was admitted to the hospital for further evaluation and management of COPD with acute exacerbation.  Started on bowel regimen for constipation.  RUQ U/S was negative.   Abdominal pain and GI complaints resolved after patient had bowel movement/s.    Being treated wit IV steroids, neb treatments, mucolytics. Breathing improving since admission.

## 2023-01-12 NOTE — Assessment & Plan Note (Addendum)
Likely volume depletion and dehydration in differentiating with include paraneoplastic syndrome with her metastatic lung cancer. Improved with IV fluids. Sodium 136 from 133. --Monitor BMP --Off IV fluids

## 2023-01-12 NOTE — Assessment & Plan Note (Addendum)
- 

## 2023-01-12 NOTE — ED Notes (Addendum)
Patient assisted to bedside commode. Patient becomes tachycardic and SOB with labored breathing during exertion

## 2023-01-12 NOTE — Assessment & Plan Note (Addendum)
-

## 2023-01-12 NOTE — H&P (Signed)
[place]   PATIENT NAME: Brittney Fisher    MR#:  [id number]  DATE OF BIRTH:  [birth year]  DATE OF ADMISSION:  01/11/2023  PRIMARY CARE PHYSICIAN: Pcp, No   Patient is coming from: Home  REQUESTING/REFERRING PHYSICIAN: Merlyn Lot, MD  CHIEF COMPLAINT:   Chief Complaint  Patient presents with   Abdominal Pain    HISTORY OF PRESENT ILLNESS:  Brittney Fisher is a 62 y.o. female with medical history significant for COPD, GERD and metastatic lung cancer, who presented to the emergency room with acute onset of worsening dyspnea over the last few days with associated cough productive of greenish sputum as well as wheezing.  She admits to associated nausea and vomiting with poor p.o. intake and epigastric abdominal pain.  No fever or chills.  No chest pain or palpitations.  No dysuria, oliguria or hematuria or flank pain.  ED Course: Upon presentation to the emergency room, temperature was 99 and heart rate 113 with otherwise normal vital signs.  Labs revealed mild hyponatremia and CO2 of 18 with anion gap of 16 and albumin 3.3 with otherwise unremarkable CMP.  Lactic acid was 1.1 and later 1.2 high sensitive troponin I was 5 and later the same.  CBC was within normal. EKG as reviewed by me : Sinus tachycardia with a rate of 132 with biatrial enlargement. Imaging:  Abdominal and pelvic scan revealed the following: Study limited without the advantage of contrast and lack of intra-abdominal fat.   No bowel obstruction, free air or free fluid. Diffuse scattered colonic stool. Normal appendix.   Anasarca. Interval placement of a dynamic right hip screw transfixing the lucent lesion in the femoral neck. There is sclerotic bone metastases elsewhere as per prior history.   Distended gallbladder but unchanged from previous. If there is concern of acute gallbladder pathology ultrasound may be useful.   Extensive abnormality along the lung bases consistent with known history  of lung cancer. Please correlate with recent workup.  The patient was given 2 DuoNebs, 2 L bolus of IV normal saline, 30 g of p.o. lactulose, 125 mg of IV Solu-Medrol, 5 mg of IV Reglan and 4 mg of IV morphine sulfate.  She will be admitted to a progressive unit bed for further evaluation and management.   PAST MEDICAL HISTORY:   Past Medical History:  Diagnosis Date   COPD (chronic obstructive pulmonary disease) (HCC)    GERD (gastroesophageal reflux disease)    Metastatic lung cancer (metastasis from lung to other site) ([place]) 11/02/2020  -Anxiety  PAST SURGICAL HISTORY:   Past Surgical History:  Procedure Laterality Date   COLONOSCOPY WITH ESOPHAGOGASTRODUODENOSCOPY (EGD)     COLONOSCOPY WITH PROPOFOL N/A 11/14/2015   Procedure: COLONOSCOPY WITH PROPOFOL;  Surgeon: Hulen Luster, MD;  Location: [facility] ENDOSCOPY;  Service: Gastroenterology;  Laterality: N/A;   ESOPHAGOGASTRODUODENOSCOPY (EGD) WITH PROPOFOL N/A 11/14/2015   Procedure: ESOPHAGOGASTRODUODENOSCOPY (EGD) WITH PROPOFOL;  Surgeon: Hulen Luster, MD;  Location: [facility] ENDOSCOPY;  Service: Gastroenterology;  Laterality: N/A;   FEMUR IM NAIL Right 01/01/2023   Procedure: TREATMENT, INTERTROCHANTERIC FEMORAL FRACTURE, WITH INTRAMEDULLARY IMPLANT (SURG);  Surgeon: Lovell Sheehan, MD;  Location: [facility] ORS;  Service: Orthopedics;  Laterality: Right;    SOCIAL HISTORY:   Social History   Tobacco Use   Smoking status: Former    Types: Cigarettes    Quit date: 09/2021    Years since quitting: 1.3   Smokeless tobacco: Never  Substance Use Topics  Alcohol use: Never    FAMILY HISTORY:   Family History  Problem Relation Age of Onset   Breast cancer Cousin 71   Diabetes type II Mother    Hypertension Mother    Colon cancer Father    Hypertension Father     DRUG ALLERGIES:   Allergies  Allergen Reactions   Carboplatin Shortness Of Breath, Itching and Cough    REVIEW OF SYSTEMS:   ROS As per history of present illness. All  pertinent systems were reviewed above. Constitutional, HEENT, cardiovascular, respiratory, GI, GU, musculoskeletal, neuro, psychiatric, endocrine, integumentary and hematologic systems were reviewed and are otherwise negative/unremarkable except for positive findings mentioned above in the HPI.   MEDICATIONS AT HOME:   Prior to Admission medications   Medication Sig Start Date End Date Taking? Authorizing Provider  acetaminophen (TYLENOL) 500 MG tablet Take 1,000 mg by mouth every 8 (eight) hours as needed for moderate pain.     [provider]  albuterol (VENTOLIN HFA) 108 (90 Base) MCG/ACT inhaler Inhale 2 puffs into the lungs every 6 (six) hours as needed for wheezing or shortness of breath. 11/01/22   [name], MD  ALPRAZolam Duanne Moron) 0.5 MG tablet Take 1 tablet (0.5 mg total) by mouth See admin instructions. Take 1 tablet 60 minutes before procedure; if needed due to incomplete response and/or duration of procedure, may repeat another 1 tablet after 30 minutes. Patient not taking: Reported on 12/05/2022 05/01/22   [name], MD  amoxicillin-clavulanate (AUGMENTIN) 875-125 MG tablet Take 1 tablet by mouth 2 (two) times daily. Patient not taking: Reported on 01/01/2023 12/21/22   Borders, Kirt Boys, NP  benzonatate (TESSALON) 100 MG capsule Take 1 capsule by mouth three times daily as needed for cough Patient taking differently: 100 mg 3 (three) times daily as needed for cough. Take 1 capsule by mouth three times daily as needed for cough 10/09/22   [name], MD  calcium carbonate (TUMS - DOSED IN MG ELEMENTAL CALCIUM) 500 MG chewable tablet Chew 1 tablet by mouth as needed. Patient not taking: Reported on 12/21/2022    [provider]  dexamethasone (DECADRON) 4 MG tablet Take 1 tablet (4 mg total) by mouth daily. 12/05/22   Jacquelin Hawking, NP  fluticasone-salmeterol (ADVAIR DISKUS) 250-50 MCG/ACT AEPB Inhale 1 puff into the lungs in the morning and at bedtime. 12/13/22 01/12/23   Borders, Kirt Boys, NP  folic acid (FOLVITE) 1 MG tablet Take 1 tablet (1 mg total) by mouth daily. Start 5-7 days before Alimta chemotherapy. Continue until 21 days after Alimta completed. 05/25/21   Cammie Sickle, MD  gabapentin (NEURONTIN) 100 MG capsule Take 1 capsule (100 mg total) by mouth 3 (three) times daily. 05/22/22   [name], MD  guaiFENesin (MUCINEX) 600 MG 12 hr tablet Take 1 tablet (600 mg total) by mouth 2 (two) times daily. 10/22/20   Fritzi Mandes, MD  ipratropium-albuterol (DUONEB) 0.5-2.5 (3) MG/3ML SOLN Take 3 mLs by nebulization 3 (three) times daily. 11/01/22   [name], MD  LUMAKRAS 120 MG tablet TAKE 8 TABLETS BY MOUTH DAILY 10/24/22   [name], MD  magic mouthwash w/lidocaine SOLN Take 5 mLs by mouth 4 (four) times daily as needed for mouth pain. Sig: Swish/Swallow 5-10 ml four times a day as needed. Dispense 480 ml. 1RF Patient not taking: Reported on 06/21/2022 12/19/20   [name], MD  naloxone [facility]) nasal spray 4 mg/0.1 mL For suspected overdose of narcotics. 12/05/22   Burns,  Wandra Feinstein, NP  ondansetron (ZOFRAN) 8 MG tablet Take 1 tablet (8 mg total) by mouth 2 (two) times daily as needed for refractory nausea / vomiting. Start on day 3 after chemo. Patient not taking: Reported on 06/21/2022 11/02/20   [name], MD  oxyCODONE (OXY IR/ROXICODONE) 5 MG immediate release tablet Take 1-2 tablets (5-10 mg total) by mouth every 4 (four) hours as needed for moderate pain or severe pain. 01/02/23   Borders, Kirt Boys, NP  oxyCODONE (OXYCONTIN) 20 mg 12 hr tablet Take 1 tablet (20 mg total) by mouth every 12 (twelve) hours. 01/02/23   Borders, Kirt Boys, NP  Polyethylene Glycol 3350 (MIRALAX PO) Take by mouth daily as needed.    [provider]  prochlorperazine (COMPAZINE) 10 MG tablet Take 1 tablet (10 mg total) by mouth every 6 (six) hours as needed (Nausea or vomiting). Patient not taking: Reported on 06/21/2022 11/02/20   [name], MD  protein supplement shake (PREMIER  PROTEIN) LIQD Take 11 oz by mouth daily.    [provider]      VITAL SIGNS:  Blood pressure 121/70, pulse (!) 117, temperature 98.7 F (37.1 C), temperature source Oral, resp. rate 19, height 5\' 11"  (1.803 m), weight 60.7 kg, SpO2 96 %.  PHYSICAL EXAMINATION:  Physical Exam  GENERAL:  62 y.o.-year-old cachectic African American female patient lying in the bed with no acute distress.  EYES: Pupils equal, round, reactive to light and accommodation. No scleral icterus. Extraocular muscles intact.  HEENT: Head atraumatic, normocephalic. Oropharynx and nasopharynx clear.  NECK:  Supple, no jugular venous distention. No thyroid enlargement, no tenderness.  LUNGS: Diffuse expiratory wheezes with tight expiratory airflow and harsh vesicular breathing.  No use of accessory muscles of respiration.  CARDIOVASCULAR: Regular rate and rhythm, S1, S2 normal. No murmurs, rubs, or gallops.  ABDOMEN: Soft, nondistended, nontender. Bowel sounds present. No organomegaly or mass.  EXTREMITIES: No pedal edema, cyanosis, or clubbing.  NEUROLOGIC: Cranial nerves II through XII are intact. Muscle strength 5/5 in all extremities. Sensation intact. Gait not checked.  PSYCHIATRIC: The patient is alert and oriented x 3.  Normal affect and good eye contact. SKIN: No obvious rash, lesion, or ulcer.   LABORATORY PANEL:   CBC Recent Labs  Lab 01/11/23 1738  WBC 7.4  HGB 12.1  HCT 38.9  PLT 245   ------------------------------------------------------------------------------------------------------------------  Chemistries  Recent Labs  Lab 01/11/23 1738  NA 133*  K 4.1  CL 99  CO2 18*  GLUCOSE 91  BUN 14  CREATININE 0.65  CALCIUM 9.5  AST 17  ALT 10  ALKPHOS 58  BILITOT 0.8   ------------------------------------------------------------------------------------------------------------------  Cardiac Enzymes No results for input(s): "TROPONINI" in the last 168  hours. ------------------------------------------------------------------------------------------------------------------  RADIOLOGY:  CT Angio Chest PE W and/or Wo Contrast  Result Date: 01/11/2023 CLINICAL DATA:  Pulmonary embolism (PE) suspected, high prob Patient with history of metastatic lung cancer. EXAM: CT ANGIOGRAPHY CHEST WITH CONTRAST TECHNIQUE: Multidetector CT imaging of the chest was performed using the standard protocol during bolus administration of intravenous contrast. Multiplanar CT image reconstructions and MIPs were obtained to evaluate the vascular anatomy. RADIATION DOSE REDUCTION: This exam was performed according to the departmental dose-optimization program which includes automated exposure control, adjustment of the mA and/or kV according to patient size and/or use of iterative reconstruction technique. CONTRAST:  80mL OMNIPAQUE IOHEXOL 350 MG/ML SOLN COMPARISON:  Radiograph earlier today. Staging chest CT 12/24/2022 FINDINGS: Cardiovascular: There are no filling defects within the pulmonary  arteries to suggest pulmonary embolus. Aortic atherosclerosis and tortuosity. No acute aortic findings. Stable heart size. Trace pericardial effusion. Bilateral anterior chest wall collaterals, however no evidence of SVC occlusion. Mediastinum/Nodes: Ill-defined soft tissue planes within the mediastinum again seen without significant interval change. Ill-defined soft tissue density seen anterior to the trachea, at the level of the carina and adjacent to the right mainstem bronchus. 14 mm central left hilar lymph node versus pulmonary nodule. Patulous esophagus. Lungs/Pleura: Again seen pleural thickening the right lung apex, near circumferential throughout the right hemithorax with areas of pleural nodularity. Areas of peripheral pleural based nodularity in the right hemithorax are again seen. Representative right lower lobe pleural base nodule measures 15 x 12 mm series 7, image 97, unchanged  by my retrospective measurement. Peri diaphragmatic nodule in the right lung base measures 14 mm series 7, image 111, also unchanged. Pleural based right middle lobe nodule measures 11 mm series 7, image 109, unchanged. Interstitial nodularity throughout the right lower lobe with bronchial thickening is stable. Ill-defined soft tissue density in the central right perihilar lung is unchanged. Small right pleural effusion. Consolidation in the left lower lobe, most prominent centrally, with heterogeneous parenchyma, stable from prior. Subpleural left upper lobe nodule measures 12 mm series 7, image 102, previously 10 mm. Subpleural left upper lobe nodule measuring 7 mm series 7, image 58, previously 6 mm. Similar left pleural thickening with small left pleural effusion. No definite acute airspace disease. Upper Abdomen: Assessed on abdominopelvic CT earlier today. Musculoskeletal: Multifocal sclerotic osseous lesions typical of metastatic disease. Exaggerated upper thoracic kyphosis. Unchanged upper thoracic mild compression deformities. No new osseous findings. Review of the MIP images confirms the above findings. IMPRESSION: 1. No pulmonary embolus. 2. History of known lung cancer, with stable appearance of the chest from earlier this month. Recommend continued oncologic follow-up per protocol. 3. Ill-defined soft tissue planes within the mediastinum and central right lung are unchanged from prior exam. 4. Stable osseous metastatic disease. Aortic Atherosclerosis (ICD10-I70.0). Electronically Signed   By: Keith Rake M.D.   On: 01/11/2023 21:21   CT ABDOMEN PELVIS WO CONTRAST  Result Date: 01/11/2023 CLINICAL DATA:  Left lower quadrant abdominal pain. 2 days. History of non-small-cell lung cancer. EXAM: CT ABDOMEN AND PELVIS WITHOUT CONTRAST TECHNIQUE: Multidetector CT imaging of the abdomen and pelvis was performed following the standard protocol without IV contrast. RADIATION DOSE REDUCTION: This exam was  performed according to the departmental dose-optimization program which includes automated exposure control, adjustment of the mA and/or kV according to patient size and/or use of iterative reconstruction technique. COMPARISON:  CT 12/24/2022. older CT sidewall. X-ray 01/11/2023 earlier FINDINGS: Lower chest: Once again at the lung bases are pleural effusions, pleural thickening and masslike areas with reticulonodular changes, similar to the prior examination. Please correlate with prior chest CT and clinical history of lung cancer. Pericardial effusion is also seen with some enlarged pre cardiophrenic lymph nodes Hepatobiliary: On this non IV contrast exam, the liver is grossly preserved. The gallbladder is distended but unchanged from prior. Pancreas: Grossly the pancreas has preserved parenchyma. Spleen: Spleen is nonenlarged. Adrenals/Urinary Tract: Stable mild thickening of the adrenal glands. Bilateral Bosniak 1 renal cysts are stable. No specific imaging follow-up of the cysts in the kidneys. No renal or ureteral stones clearly seen. Contracted urinary bladder. Stomach/Bowel: Stomach is nondilated. There is some luminal air and fluid. There is some high attenuation seen along the course of the colon with scattered stool. The colon is nondilated. The  cecum is in the low right hemipelvis. Very redundant course to loops of colon. Normal appendix identified posterior cecum in the right hemipelvis as seen on coronal series 4, image 39. No clear adjacent inflammatory stranding along the colon. Small bowel is nondilated. Vascular/Lymphatic: Diffuse vascular calcifications along the aorta and branch vessels. Grossly preserved caliber IVC. Reproductive: Uterus and bilateral adnexa are unremarkable. There are some calcifications along the uterus, possible calcified fibroids. Other: Anasarca.  No clear ascites. Musculoskeletal: Scattered sclerotic bone metastases are identified. Please correlate with prior workup.  Interval placement of dynamic right hip screw with acute surgical changes. Lytic lesion along the right femoral neck is again seen. Stranding. IMPRESSION: Study limited without the advantage of contrast and lack of intra-abdominal fat. No bowel obstruction, free air or free fluid. Diffuse scattered colonic stool. Normal appendix. Anasarca. Interval placement of a dynamic right hip screw transfixing the lucent lesion in the femoral neck. There is sclerotic bone metastases elsewhere as per prior history. Distended gallbladder but unchanged from previous. If there is concern of acute gallbladder pathology ultrasound may be useful. Extensive abnormality along the lung bases consistent with known history of lung cancer. Please correlate with recent workup. Electronically Signed   By: Jill Side M.D.   On: 01/11/2023 19:32   DG Chest Portable 1 View  Result Date: 01/11/2023 CLINICAL DATA:  Shortness of breath. Cancer. Evaluate for effusion or edema. EXAM: PORTABLE CHEST 1 VIEW COMPARISON:  Radiograph 12/21/2022. CT 12/24/2022 FINDINGS: Chronic volume loss in the right hemithorax. Chronic right apical opacity, ill-defined right basilar opacities and right basilar effusion. Left infrahilar opacity and small left effusion are similar to prior CT. No pulmonary edema. Stable heart size and mediastinal contours. No pneumothorax. Scattered sclerotic lesions are better appreciated on prior CT. IMPRESSION: 1. Stable radiographic appearance of the chest. Stable right lung volume loss with apical and basilar opacity and right pleural effusion from earlier this month. 2. Stable left basilar opacity and small left pleural effusion. 3. No pulmonary edema. Electronically Signed   By: Keith Rake M.D.   On: 01/11/2023 19:19   DG Abdomen 1 View  Result Date: 01/11/2023 CLINICAL DATA:  Constipation. Patient reports abdominal pain. EXAM: ABDOMEN - 1 VIEW COMPARISON:  CT 12/24/2022 FINDINGS: Added views of the abdomen  obtained. No bowel dilatation to suggest obstruction. Moderate volume of stool in the colon, mild gaseous distension of splenic flexure. No radiopaque calculi or abnormal soft tissue calcifications. Surgical hardware in the right proximal femur is partially visualized. Stable sclerotic densities within the left proximal femur. Sclerotic lesions within the bony pelvis are only faintly visualized by radiograph. Known sclerotic metastatic disease. IMPRESSION: Nonobstructive bowel gas pattern. Moderate volume of colonic stool. Electronically Signed   By: Keith Rake M.D.   On: 01/11/2023 18:10      IMPRESSION AND PLAN:  Assessment and Plan: * COPD exacerbation ([place]) - The patient be admitted to a progressive unit bed. - We will continue steroid therapy with IV Solu-Medrol and bronchodilator therapy with DuoNebs 4 times daily and every 4 hours as needed. - She will be placed on IV Rocephin given purulent sputum production. - O2 protocol will be followed. - Mucolytic therapy will be provided. - We will hold off Advair discus for now.  Generalized abdominal pain - Pain management will be provided. - Abdominal pelvic CT scan showed no acute finding except for distended gallbladder. - Will obtain right upper quadrant ultrasound given associated nausea and vomiting.  Hyponatremia - This  could be related to volume depletion and dehydration in differentiating with include paraneoplastic syndrome with her metastatic lung cancer. - She will be hydrated with IV normal saline and will follow sodium level.  Peripheral neuropathy - We will continue Neurontin.  Anxiety - We will continue her Xanax.  Metastatic lung cancer (metastasis from lung to other site) [facility]) - The patient is on Lumakras will be resumed. - Pain management will be provided.   DVT prophylaxis: Lovenox. Advanced Care Planning:  Code Status: The patient is DNR/DNI. Family Communication:  The plan of care was discussed in  details with the patient (and family). I answered all questions. The patient agreed to proceed with the above mentioned plan. Further management will depend upon hospital course. Disposition Plan: Back to previous home environment Consults called: none. All the records are reviewed and case discussed with ED provider.  Status is: Inpatient   At the time of the admission, it appears that the appropriate admission status for this patient is inpatient.  This is judged to be reasonable and necessary in order to provide the required intensity of service to ensure the patient's safety given the presenting symptoms, physical exam findings and initial radiographic and laboratory data in the context of comorbid conditions.  The patient requires inpatient status due to high intensity of service, high risk of further deterioration and high frequency of surveillance required.  I certify that at the time of admission, it is my clinical judgment that the patient will require inpatient hospital care extending more than 2 midnights.                            Dispo: The patient is from: Home              Anticipated d/c is to: Home              Patient currently is not medically stable to d/c.              Difficult to place patient: No  Christel Mormon M.D on 01/12/2023 at 4:06 AM  [facility]   From 7 PM-7 AM, contact night-coverage [email or web address]  CC: Primary care physician; Pcp, No

## 2023-01-12 NOTE — Assessment & Plan Note (Signed)
No UTI symptoms. On Rocephin for possible bacterial bronchitis with COPD exacerbation. Continue for now.

## 2023-01-12 NOTE — ED Notes (Signed)
Patient is resting comfortably. 

## 2023-01-12 NOTE — Assessment & Plan Note (Addendum)
Stryker resumed. Home pain regimen continued. Pt wants to wean herself down/off opiate therapy. She discussed this with me and her niece at bedside. Agreeable to scheduled 1000 mg Tylenol to help reduce need for PRN oxycodone. Started Movantik for Opioid-induced constipation Appreciate oncology palliative consult for pain management as pt trying to wean herself off opioid and showing signs of withdrawal and/or uncontrolled pain.

## 2023-01-12 NOTE — Assessment & Plan Note (Addendum)
1/27 ongoing dyspnea but improving, diffuse wheezing and poor air movement 1/28 remains quite dyspneic on minimal exertion --Transitioned IV Solu-Medrol >> Prednisone this AM --Scheduled and PRN bronchodilator / nebs --Continue Rocephin - O2 per protocol. Has been on room air. - Mucolytics, supportive care - Advair held on admission given systemic steroids

## 2023-01-12 NOTE — Assessment & Plan Note (Addendum)
Likely due to constipation as this resolved after BM's with bowel regimen started on admission.  Abdominal pelvic CT scan showed no acute finding except for distended gallbladder. Negative RUQ U/S, no acute cholecystitis or significant findings.

## 2023-01-12 NOTE — Progress Notes (Signed)
Progress Note   Patient: Brittney Fisher Brittney Fisher DOB: Brittney Fisher DOA: 01/11/2023     1 DOS: the patient was seen and examined on 01/12/2023   Brief hospital course: Brittney Fisher is a 62 y.o. female with medical history significant for COPD, GERD and metastatic lung cancer, who presented to the ED on evening of 01/11/2023 for evaluation of worsening dyspnea over a few days with associated cough productive of greenish sputum, and wheezing.  She also reported having nausea and vomiting with poor p.o. intake and ]abdominal pain.  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  No UTI symptoms.   ED Course: HR 113 initially, EKG sinus tachycardia.   Labs with mild hyponatremia and CO2 of 18 with anion gap of 16 and albumin 3.3 with otherwise unremarkable CMP.   Lactic acid normal x 2.  Hs-troponin normal x 2.   CT abdomen pelvis was essentially unremarkable but did show significant stool burden, distended gallbladder and anasarca and changes in lung bases consistent with her known lung cancer.    She was admitted to the hospital for further evaluation and management of COPD with acute exacerbation.  Started on bowel regimen for constipation.  RUQ U/S was negative.   Abdominal pain and GI complaints resolved after patient had bowel movement/s.    Being treated wit IV steroids, neb treatments, mucolytics. Breathing improving since admission.  Assessment and Plan: * COPD exacerbation [name] 1/27 ongoing dyspnea but improving, diffuse wheezing and poor air movement --Continue IV Solu-Medrol  --Scheduled and PRN bronchodilator / nebs --Continue Rocephin - O2 per protocol. Has been on room air. - Mucolytics, supportive care - Advair held on admission given systemic steroids  Generalized abdominal pain Likely due to constipation as this resolved after BM's with bowel regimen started on admission.  Abdominal pelvic CT scan showed no acute finding except  for distended gallbladder. Negative RUQ U/S, no acute cholecystitis or significant findings.   Hyponatremia Likely volume depletion and dehydration in differentiating with include paraneoplastic syndrome with her metastatic lung cancer. --Continue fluids and monitor BMP --If seems like SIADH, will dc fluids and place on fluid restriction if worsening. Na 133, mild.  Abnormal urinalysis No UTI symptoms. On Rocephin for possible bacterial bronchitis with COPD exacerbation. Continue for now.  Peripheral neuropathy Continue Neurontin.  Anxiety Continue home Xanax.  Metastatic lung cancer (metastasis from lung to other site) [facility]) [place] resumed. Home pain regimen continued. Pt wants to wean herself down/off opiate therapy. She discussed this with me and her niece at bedside. Agreeable to scheduled 1000 mg Tylenol to help reduce need for PRN oxycodone. Start Movantik for Opioid-induced constipation        Subjective: Pt seen in ED holding for a bed, niece at bedside.  She reports feeling better after bowel movement, abdominal pain resolved.  No N/V.  Discusses wanting to reduce or taper off her opioid regimen.  Reports breathing has improved a lot since admission but remains more short of breath than her baseline.   Physical Exam: Vitals:   01/12/23 1300 01/12/23 1400 01/12/23 1430 01/12/23 1436  BP: 117/71 125/65 123/74   Pulse:  (!) 116 (!) 114 (!) 113  Resp:  14 16 17   Temp:    98.7 F (37.1 C)  TempSrc:    Oral  SpO2:  95% 95% 96%  Weight:      Height:       General exam: awake, alert, no acute distress,  underweight HEENT: atraumatic, clear conjunctiva, anicteric sclera, moist mucus membranes, hearing grossly normal  Respiratory system: very poor air movement, expiratory wheezes, diminished bases, normal respiratory effort at rest, on room air Cardiovascular system: normal S1/S2, RRR, no pedal edema.   Gastrointestinal system: soft, NT, ND, no HSM felt, +bowel  sounds. Central nervous system: A&O x4. no gross focal neurologic deficits, normal speech Extremities: moves all, no edema, normal tone Skin: dry, intact, normal temperature, normal color, No rashes, lesions or ulcers Psychiatry: normal mood, congruent affect, judgement and insight appear normal  Data Reviewed:  Notable labs ---  Na 133, glucose 122, Hbg 10.6 stable  Family Communication: Niece (HPOA) at bedside on rounds  Disposition: Status is: Inpatient Remains inpatient appropriate because: remains on IV steroids pending further improvement in dyspnea with COPD exacerbation   Planned Discharge Destination: Home    Time spent: 42 minutes  Author: [name], DO 01/12/2023 5:17 PM  For on call review [email or web address].

## 2023-01-13 ENCOUNTER — Encounter: Payer: Self-pay | Admitting: Internal Medicine

## 2023-01-13 DIAGNOSIS — J441 Chronic obstructive pulmonary disease with (acute) exacerbation: Secondary | ICD-10-CM | POA: Diagnosis not present

## 2023-01-13 LAB — MAGNESIUM: Magnesium: 1.6 mg/dL — ABNORMAL LOW (ref 1.7–2.4)

## 2023-01-13 LAB — URINE CULTURE: Culture: NO GROWTH

## 2023-01-13 LAB — BASIC METABOLIC PANEL
Anion gap: 9 (ref 5–15)
BUN: 8 mg/dL (ref 8–23)
CO2: 23 mmol/L (ref 22–32)
Calcium: 8.7 mg/dL — ABNORMAL LOW (ref 8.9–10.3)
Chloride: 104 mmol/L (ref 98–111)
Creatinine, Ser: 0.51 mg/dL (ref 0.44–1.00)
GFR, Estimated: >60 mL/min (ref >60)
Glucose, Bld: 107 mg/dL — ABNORMAL HIGH (ref 70–99)
Potassium: 3.9 mmol/L (ref 3.5–5.1)
Sodium: 136 mmol/L (ref 135–145)

## 2023-01-13 MED ORDER — MAGNESIUM SULFATE 2 GM/50ML IV SOLN
2.0000 g | Freq: Once | INTRAVENOUS | Status: AC
  Administered 2023-01-13: 2 g via INTRAVENOUS
  Filled 2023-01-13: qty 50

## 2023-01-13 MED ORDER — IPRATROPIUM-ALBUTEROL 0.5-2.5 (3) MG/3ML IN SOLN
3.0000 mL | Freq: Three times a day (TID) | RESPIRATORY_TRACT | Status: DC
  Administered 2023-01-13 – 2023-01-16 (×8): 3 mL via RESPIRATORY_TRACT
  Filled 2023-01-13 (×9): qty 3

## 2023-01-13 NOTE — Progress Notes (Signed)
Progress Note   Patient: Brittney Fisher DOB: 15-Jun-1961 DOA: 01/11/2023     2 DOS: the patient was seen and examined on 01/13/2023   Brief hospital course: Brittney Fisher is a 62 y.o. female with medical history significant for COPD, GERD and metastatic lung cancer, who presented to the ED on evening of 01/11/2023 for evaluation of worsening dyspnea over a few days with associated cough productive of greenish sputum, and wheezing.  She also reported having nausea and vomiting with poor p.o. intake and ]abdominal pain.  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  No UTI symptoms.   ED Course: HR 113 initially, EKG sinus tachycardia.   Labs with mild hyponatremia and CO2 of 18 with anion gap of 16 and albumin 3.3 with otherwise unremarkable CMP.   Lactic acid normal x 2.  Hs-troponin normal x 2.   CT abdomen pelvis was essentially unremarkable but did show significant stool burden, distended gallbladder and anasarca and changes in lung bases consistent with her known lung cancer.    She was admitted to the hospital for further evaluation and management of COPD with acute exacerbation.  Started on bowel regimen for constipation.  RUQ U/S was negative.   Abdominal pain and GI complaints resolved after patient had bowel movement/s.    Being treated wit IV steroids, neb treatments, mucolytics. Breathing improving since admission.  Assessment and Plan: * COPD exacerbation ([place]) 1/27 ongoing dyspnea but improving, diffuse wheezing and poor air movement 1/28 remains quite dyspneic on minimal exertion --Transitioned IV Solu-Medrol >> Prednisone this AM --Scheduled and PRN bronchodilator / nebs --Continue Rocephin - O2 per protocol. Has been on room air. - Mucolytics, supportive care - Advair held on admission given systemic steroids  Generalized abdominal pain Likely due to constipation as this resolved after BM's with bowel regimen  started on admission.  Abdominal pelvic CT scan showed no acute finding except for distended gallbladder. Negative RUQ U/S, no acute cholecystitis or significant findings.   Hyponatremia Likely volume depletion and dehydration in differentiating with include paraneoplastic syndrome with her metastatic lung cancer. Improved with IV fluids. Sodium 136 from 133. --Monitor BMP --Off IV fluids --If seems like SIADH, will dc fluids and place on fluid restriction if worsening. Na 133, mild.  Hypomagnesemia Replacing IV Mg for Mg 1.6 this AM Monitor Mg and replace PRN  Abnormal urinalysis No UTI symptoms. On Rocephin for possible bacterial bronchitis with COPD exacerbation. Continue for now.  Peripheral neuropathy Continue Neurontin.  Anxiety Continue home Xanax.  Metastatic lung cancer (metastasis from lung to other site) [facility]) [place] resumed. Home pain regimen continued. Pt wants to wean herself down/off opiate therapy. She discussed this with me and her niece at bedside. Agreeable to scheduled 1000 mg Tylenol to help reduce need for PRN oxycodone. Start Movantik for Opioid-induced constipation        Subjective: Pt seen awake sitting up in bed. She reports feeling somewhat better but still more short of breath than baseline.  Got pretty short of breath when up to ambulate short distance to the bathroom earlier.     Physical Exam: Vitals:   01/12/23 2040 01/12/23 2333 01/13/23 0725 01/13/23 0748  BP:  103/72 120/75   Pulse:  99 98   Resp:  20 18   Temp:  98 F (36.7 C) 98 F (36.7 C)   TempSrc:      SpO2: 96% 97% 100% 98%  Weight:  Height:       General exam: awake, alert, no acute distress, underweight HEENT: atraumatic, clear conjunctiva, anicteric sclera, moist mucus membranes, hearing grossly normal  Respiratory system: diminished right upper lung field, poor air movement, no expiratory wheezes heard, diminished bases, normal respiratory effort at  rest, on room air Cardiovascular system: normal S1/S2, RRR, no pedal edema.   Gastrointestinal system: soft, NT, ND, no HSM felt, +bowel sounds. Central nervous system: A&O x4. no gross focal neurologic deficits, normal speech Extremities: moves all, no edema, normal tone Skin: dry, intact, normal temperature, normal color, No rashes, lesions or ulcers Psychiatry: normal mood, congruent affect, judgement and insight appear normal  Data Reviewed:  Notable labs ---  Normal BMP except glucose 107, Ca 8.7.  Mg 1.6  Family Communication: Niece (HPOA) at bedside on rounds 1/27  Disposition: Status is: Inpatient Remains inpatient appropriate because: remains significantly dyspneic on minimal exertion with COPD exacerbation.  Needs further improvement.  Anticipate 1-2 more day.   Planned Discharge Destination: Home    Time spent: 36 minutes  Author: [name], DO 01/13/2023 2:43 PM  For on call review [email or web address].

## 2023-01-13 NOTE — TOC CM/SW Note (Addendum)
Patient known to [facility] for recent admission this month.  Per chart review, patient is from home with parents. Patient got a 3in1 on previous admission. CSW reached out to [place] with [facility] to see if they are still active with patient - awaiting response.  10:35- Per Alwyn Ren, patient no longer active with [facility].   Oleh Genin, [place]

## 2023-01-13 NOTE — Assessment & Plan Note (Signed)
Resolved w/ replacement 1/28, 1/30 Monitor Mg and replace PRN

## 2023-01-14 DIAGNOSIS — J441 Chronic obstructive pulmonary disease with (acute) exacerbation: Secondary | ICD-10-CM | POA: Diagnosis not present

## 2023-01-14 MED ORDER — AZITHROMYCIN 500 MG PO TABS
500.0000 mg | ORAL_TABLET | Freq: Every day | ORAL | Status: AC
  Administered 2023-01-14: 500 mg via ORAL
  Filled 2023-01-14: qty 1

## 2023-01-14 MED ORDER — AZITHROMYCIN 250 MG PO TABS
250.0000 mg | ORAL_TABLET | Freq: Every day | ORAL | Status: AC
  Administered 2023-01-15 – 2023-01-18 (×4): 250 mg via ORAL
  Filled 2023-01-14 (×4): qty 1

## 2023-01-14 NOTE — TOC Initial Note (Signed)
Transition of Care [facility]) - Initial/Assessment Note    Patient Details  Name: Brittney Fisher MRN: [id number] Date of Birth: Apr 21, 1961  [facility] CM/SW Contact:    Candie Chroman, LCSW Phone Number: 01/14/2023, 10:21 AM  Clinical Narrative:   Readmission prevention screen complete. CSW met with patient. No supports at bedside. CSW introduced role and explained that discharge planning would be discussed. Patient does not have a PCP. She used to see Ranae Plumber, PA but no longer goes to [facility] Her niece (who is also her 53) transports her to appointments. Patient is not set up with Medicaid Transportation. Encouraged her to call DSS for assessment in case she ever needs to use them to get to appointments. Pharmacy is [facility]. No issues obtaining medications. Patient lives home with her mother. No home health prior to admission. Patient uses a RW at home. No further concerns. CSW encouraged patient to contact CSW as needed. CSW will continue to follow patient for support and facilitate return home once stable. Niece will transport her home at discharge.                Expected Discharge Plan: Home/Self Care Barriers to Discharge: Continued Medical Work up   Patient Goals and CMS Choice            Expected Discharge Plan and Services     Post Acute Care Choice: NA Living arrangements for the past 2 months: Single Family Home                                      Prior Living Arrangements/Services Living arrangements for the past 2 months: Single Family Home Lives with:: Parents Patient language and need for interpreter reviewed:: Yes Do you feel safe going back to the place where you live?: Yes      Need for Family Participation in Patient Care: Yes (Comment) Care giver support system in place?: Yes (comment) Current home services: DME Criminal Activity/Legal Involvement Pertinent to Current Situation/Hospitalization: No -  Comment as needed  Activities of Daily Living Home Assistive Devices/Equipment: Bedside commode/3-in-1 ADL Screening (condition at time of admission) Patient's cognitive ability adequate to safely complete daily activities?: Yes Is the patient deaf or have difficulty hearing?: No Does the patient have difficulty seeing, even when wearing glasses/contacts?: No Does the patient have difficulty concentrating, remembering, or making decisions?: No Patient able to express need for assistance with ADLs?: No Does the patient have difficulty dressing or bathing?: No Independently performs ADLs?: Yes (appropriate for developmental age) Does the patient have difficulty walking or climbing stairs?: Yes Weakness of Legs: Right Weakness of Arms/Hands: None  Permission Sought/Granted                  Emotional Assessment Appearance:: Appears stated age Attitude/Demeanor/Rapport: Engaged Affect (typically observed): Accepting, Appropriate, Calm Orientation: : Oriented to Self, Oriented to  Time, Oriented to Place, Oriented to Situation Alcohol / Substance Use: Not Applicable Psych Involvement: No (comment)  Admission diagnosis:  Tachycardia [R00.0] Generalized abdominal pain [R10.84] COPD exacerbation (HCC) [J44.1] COPD with acute exacerbation (HCC) [J44.1] Dyspnea, unspecified type [R06.00] Patient Active Problem List   Diagnosis Date Noted   Hypomagnesemia 01/13/2023   Anxiety 01/12/2023   Peripheral neuropathy 01/12/2023   Generalized abdominal pain 01/12/2023   Hyponatremia 01/12/2023   Abnormal urinalysis 01/12/2023   COPD exacerbation ([place]) 01/11/2023   Impending  pathologic fracture 01/01/2023   Insomnia 07/19/2022   Dysuria 06/22/2022   Bone metastasis 07/28/2021   Anemia 01/09/2021   Odynophagia 01/09/2021   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   Encounter for antineoplastic chemotherapy 11/25/2020   Tachycardia 11/25/2020   Neoplasm related pain 11/25/2020   Primary malignant  neoplasm of lung metastatic to other site [facility]) 11/04/2020   Metastatic lung cancer (metastasis from lung to other site) ([place]) 11/02/2020   Weight loss 10/28/2020   Goals of care, counseling/discussion 10/28/2020   Malnutrition of moderate degree 10/22/2020   Pleural effusion 10/20/2020   Malignant pleural effusion    COPD with acute exacerbation (HCC)    Dizziness    Metastatic malignant neoplasm (HCC)    Shortness of breath    S/P thoracentesis    Lung mass    PCP:  Pcp, No Pharmacy:   [facility] [address]), [place] [address] [place] [address] Phone: [phone] Fax: [phone]  [facility] [place] - [address] [place] [address] Phone: [phone] Fax: [phone]     Social Determinants of Health (SDOH) Social History: [place]: No Food Insecurity (01/12/2023)  Housing: Low Risk  (01/12/2023)  Transportation Needs: No Transportation Needs (01/12/2023)  Utilities: Not At Risk (01/12/2023)  Tobacco Use: Medium Risk (01/13/2023)   SDOH Interventions:     Readmission Risk Interventions    01/14/2023   10:20 AM  Readmission Risk Prevention Plan  Transportation Screening Complete  PCP or Specialist Appt within 3-5 Days Complete  Social Work Consult for [place] Planning/Counseling Complete  Palliative Care Screening Not Applicable  Medication Review [employer]) Complete

## 2023-01-14 NOTE — Progress Notes (Signed)
Progress Note   Patient: Brittney Fisher QPR:[id number] DOB: 1961-08-30 DOA: 01/11/2023     3 DOS: the patient was seen and examined on 01/14/2023   Brief hospital course: RUTA CAPECE is a 62 y.o. female with medical history significant for COPD, GERD and metastatic lung cancer, who presented to the ED on evening of 01/11/2023 for evaluation of worsening dyspnea over a few days with associated cough productive of greenish sputum, and wheezing.  She also reported having nausea and vomiting with poor p.o. intake and ]abdominal pain.  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  No UTI symptoms.   ED Course: HR 113 initially, EKG sinus tachycardia.   Labs with mild hyponatremia and CO2 of 18 with anion gap of 16 and albumin 3.3 with otherwise unremarkable CMP.   Lactic acid normal x 2.  Hs-troponin normal x 2.   CT abdomen pelvis was essentially unremarkable but did show significant stool burden, distended gallbladder and anasarca and changes in lung bases consistent with her known lung cancer.    She was admitted to the hospital for further evaluation and management of COPD with acute exacerbation.  Started on bowel regimen for constipation.  RUQ U/S was negative.   Abdominal pain and GI complaints resolved after patient had bowel movement/s.    Being treated wit IV steroids, neb treatments, mucolytics. Breathing improving since admission.  Assessment and Plan: * COPD exacerbation ([place]) 1/27 ongoing dyspnea but improving, diffuse wheezing and poor air movement 1/28 remains quite dyspneic on minimal exertion --Transitioned IV Solu-Medrol >> Prednisone  --Add Z-pack for antiinflammatory --Scheduled and PRN bronchodilator / nebs --Continue Rocephin - O2 per protocol. Has been on room air. - Mucolytics, supportive care - Advair held on admission given systemic steroids  Generalized abdominal pain Likely due to constipation as this resolved after  BM's with bowel regimen started on admission.  Abdominal pelvic CT scan showed no acute finding except for distended gallbladder. Negative RUQ U/S, no acute cholecystitis or significant findings.   Hyponatremia Likely volume depletion and dehydration in differentiating with include paraneoplastic syndrome with her metastatic lung cancer. Improved with IV fluids. Sodium 136 from 133. --Monitor BMP --Off IV fluids --If seems like SIADH, will dc fluids and place on fluid restriction if worsening. Na 133, mild.  Hypomagnesemia Replacing IV Mg for Mg 1.6 this AM Monitor Mg and replace PRN  Abnormal urinalysis No UTI symptoms. On Rocephin for possible bacterial bronchitis with COPD exacerbation. Continue for now.  Peripheral neuropathy Continue Neurontin.  Anxiety Continue home Xanax.  Metastatic lung cancer (metastasis from lung to other site) [facility]) [place] resumed. Home pain regimen continued. Pt wants to wean herself down/off opiate therapy. She discussed this with me and her niece at bedside. Agreeable to scheduled 1000 mg Tylenol to help reduce need for PRN oxycodone. Start Movantik for Opioid-induced constipation        Subjective: Pt seen awake sitting up in bed. Reports feeling little better today.  Not SOB at rest.  Has not been up to walk yet today.  Otherwise no acute complaints.  This afternoon, ambulated in hall and had significant dypsnea.   Physical Exam: Vitals:   01/14/23 0729 01/14/23 1344 01/14/23 1509 01/14/23 1522  BP:   114/78   Pulse:   (!) 115 (!) 108  Resp:   16   Temp:   97.9 F (36.6 C)   TempSrc:      SpO2: 98% 98% 98%  Weight:      Height:       General exam: awake, alert, no acute distress, underweight HEENT: atraumatic, clear conjunctiva, anicteric sclera, moist mucus membranes, hearing grossly normal  Respiratory system: diminished right upper lung field more than left, no expiratory wheezes heard, diminished bases, mildly  increased respiratory effort at rest, on room air Cardiovascular system: normal S1/S2, RRR, no pedal edema.   Gastrointestinal system: soft, NT, ND, no HSM felt, +bowel sounds. Central nervous system: A&O x4. no gross focal neurologic deficits, normal speech Extremities: moves all, no edema, normal tone Skin: dry, intact, normal temperature, normal color, No rashes, lesions or ulcers Psychiatry: normal mood, congruent affect, judgement and insight appear normal  Data Reviewed:  No new labs today   Family Communication: Niece (HPOA) at bedside on rounds 1/27  Disposition: Status is: Inpatient Remains inpatient appropriate because: remains significantly dyspneic on minimal exertion with COPD exacerbation.  Needs further improvement.  Anticipate 1-2 more day.   Planned Discharge Destination: Home    Time spent: 36 minutes  Author: [name], DO 01/14/2023 4:03 PM  For on call review [email or web address].

## 2023-01-15 ENCOUNTER — Inpatient Hospital Stay: Payer: Medicaid Other

## 2023-01-15 ENCOUNTER — Encounter: Payer: Self-pay | Admitting: Orthopedic Surgery

## 2023-01-15 DIAGNOSIS — J441 Chronic obstructive pulmonary disease with (acute) exacerbation: Secondary | ICD-10-CM | POA: Diagnosis not present

## 2023-01-15 DIAGNOSIS — E876 Hypokalemia: Secondary | ICD-10-CM | POA: Insufficient documentation

## 2023-01-15 LAB — BASIC METABOLIC PANEL
Anion gap: 5 (ref 5–15)
BUN: 9 mg/dL (ref 8–23)
CO2: 28 mmol/L (ref 22–32)
Calcium: 8.7 mg/dL — ABNORMAL LOW (ref 8.9–10.3)
Chloride: 101 mmol/L (ref 98–111)
Creatinine, Ser: 0.54 mg/dL (ref 0.44–1.00)
GFR, Estimated: >60 mL/min (ref >60)
Glucose, Bld: 93 mg/dL (ref 70–99)
Potassium: 3.1 mmol/L — ABNORMAL LOW (ref 3.5–5.1)
Sodium: 134 mmol/L — ABNORMAL LOW (ref 135–145)

## 2023-01-15 LAB — MAGNESIUM: Magnesium: 1.6 mg/dL — ABNORMAL LOW (ref 1.7–2.4)

## 2023-01-15 MED ORDER — LEVALBUTEROL HCL 0.63 MG/3ML IN NEBU: 0.6300 mg | INHALATION_SOLUTION | Freq: Four times a day (QID) | RESPIRATORY_TRACT | Status: DC | PRN

## 2023-01-15 MED ORDER — METHYLPREDNISOLONE SODIUM SUCC 125 MG IJ SOLR: 60.0000 mg | INTRAMUSCULAR | Status: DC

## 2023-01-15 MED ORDER — POTASSIUM CHLORIDE 20 MEQ PO PACK
40.0000 meq | PACK | ORAL | Status: AC
  Administered 2023-01-15: 40 meq via ORAL
  Filled 2023-01-15: qty 2

## 2023-01-15 MED ORDER — MAGNESIUM SULFATE 4 GM/100ML IV SOLN
4.0000 g | Freq: Once | INTRAVENOUS | Status: AC
  Administered 2023-01-15: 4 g via INTRAVENOUS
  Filled 2023-01-15: qty 100

## 2023-01-15 NOTE — Progress Notes (Addendum)
Progress Note   Patient: Brittney Fisher UMP:[id number] DOB: [birth year] DOA: 01/11/2023     4 DOS: the patient was seen and examined on 01/15/2023   Brief hospital course: Brittney Fisher is a 62 y.o. female with medical history significant for COPD, GERD and metastatic lung cancer, who presented to the ED on evening of 01/11/2023 for evaluation of worsening dyspnea over a few days with associated cough productive of greenish sputum, and wheezing.  She also reported having nausea and vomiting with poor p.o. intake and ]abdominal pain.  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  No UTI symptoms.   ED Course: HR 113 initially, EKG sinus tachycardia.   Labs with mild hyponatremia and CO2 of 18 with anion gap of 16 and albumin 3.3 with otherwise unremarkable CMP.   Lactic acid normal x 2.  Hs-troponin normal x 2.   CT abdomen pelvis was essentially unremarkable but did show significant stool burden, distended gallbladder and anasarca and changes in lung bases consistent with her known lung cancer.    She was admitted to the hospital for further evaluation and management of COPD with acute exacerbation.  Started on bowel regimen for constipation.  RUQ U/S was negative.   Abdominal pain and GI complaints resolved after patient had bowel movement/s.    Being treated wit IV steroids, neb treatments, mucolytics. Breathing improving since admission.  Assessment and Plan: * COPD exacerbation ([place]) 1/27 ongoing dyspnea but improving, diffuse wheezing and poor air movement 1/28 >>1/30 remains quite dyspneic on minimal exertion --Will switch back to IV Solu-Medrol 60 mg daily given lack of improvement   --Added Z-pack for antiinflammatory --Scheduled and PRN bronchodilator / nebs --Continue Rocephin, Zithromax - O2 per protocol. Has been on room air. - Mucolytics, supportive care - Advair held on admission given systemic steroids --No prior echo available.   Ordered 2D echo for evaluation of any underlying CHF or valvular disease contributing  Generalized abdominal pain Likely due to constipation as this resolved after BM's with bowel regimen started on admission.  Abdominal pelvic CT scan showed no acute finding except for distended gallbladder. Negative RUQ U/S, no acute cholecystitis or significant findings.   Hyponatremia Likely volume depletion and dehydration in differentiating with include paraneoplastic syndrome with her metastatic lung cancer. Improved with IV fluids. Sodium 136 from 133. --Monitor BMP --Off IV fluids --If seems like SIADH, will dc fluids and place on fluid restriction if worsening. Na 133, mild.  Hypokalemia K 3.1 this AM, replacing. Monitor BMP, replace K PRN  Hypomagnesemia Replacing IV Mg for Mg 1.6 this AM Monitor Mg and replace PRN  Abnormal urinalysis No UTI symptoms. On Rocephin for possible bacterial bronchitis with COPD exacerbation. Continue for now.  Peripheral neuropathy Continue Neurontin.  Anxiety Continue home Xanax.  Metastatic lung cancer (metastasis from lung to other site) [facility]) [place] resumed. Home pain regimen continued. Pt wants to wean herself down/off opiate therapy. She discussed this with me and her niece at bedside. Agreeable to scheduled 1000 mg Tylenol to help reduce need for PRN oxycodone. Start Movantik for Opioid-induced constipation        Subjective: Pt seen awake sitting up in bed. Niece visiting.  Pt reports feeling slightly better but remains more short of breath than her usual baseline.  Currently work being done across hall for renovations, there is a LOT of noise and vibrations of the floor causing patient a great deal of anxiety.   This  afternoon, ambulated again in hall and had severe dypsnea and tachycardia   Physical Exam: Vitals:   01/14/23 2324 01/15/23 0729 01/15/23 0754 01/15/23 1434  BP: 125/78 119/68    Pulse: (!) 106 (!) 102     Resp: 20 18    Temp: 97.9 F (36.6 C) 98.3 F (36.8 C)    TempSrc:      SpO2: 100% 98% 93% 93%  Weight:      Height:       General exam: awake, alert, no acute distress, underweight HEENT: atraumatic, clear conjunctiva, anicteric sclera, moist mucus membranes, hearing grossly normal  Respiratory system: persistent severely diminished air entry, expiratory wheezes, diminished bases, mildly increased respiratory effort at rest, on room air Cardiovascular system: normal S1/S2, RRR, no pedal edema.   Gastrointestinal system: soft, NT, ND, no HSM felt, +bowel sounds. Central nervous system: A&O x4. no gross focal neurologic deficits, normal speech Extremities: moves all, no edema, normal tone Skin: dry, intact, normal temperature, normal color, No rashes, lesions or ulcers Psychiatry: normal mood, congruent affect, judgement and insight appear normal  Data Reviewed:  No new labs today   Family Communication: Niece ([facility]) at bedside on rounds 1/27  Disposition: Status is: Inpatient Remains inpatient appropriate because: remains significantly dyspneic on minimal exertion with COPD exacerbation.  Needs further improvement.  Anticipate 1-2 more day.   Planned Discharge Destination: Home    Time spent: 36 minutes  Author: [name], DO 01/15/2023 3:07 PM  For on call review [email or web address].

## 2023-01-15 NOTE — Assessment & Plan Note (Addendum)
K 3.1 this AM, replacing. Monitor BMP, replace K PRN

## 2023-01-16 ENCOUNTER — Inpatient Hospital Stay
Admit: 2023-01-16 | Discharge: 2023-01-16 | Disposition: A | Discharge status: Home / Self Care | Payer: Medicaid Other | Attending: Internal Medicine | Admitting: Internal Medicine

## 2023-01-16 ENCOUNTER — Inpatient Hospital Stay (HOSPITAL_COMMUNITY)
Admit: 2023-01-16 | Discharge: 2023-01-16 | Disposition: A | Discharge status: Home / Self Care | Payer: Medicaid Other | Attending: Internal Medicine | Admitting: Internal Medicine

## 2023-01-16 ENCOUNTER — Other Ambulatory Visit: Payer: Medicaid Other

## 2023-01-16 DIAGNOSIS — M84551D Pathological fracture in neoplastic disease, right femur, subsequent encounter for fracture with routine healing: Secondary | ICD-10-CM | POA: Diagnosis not present

## 2023-01-16 DIAGNOSIS — M84451A Pathological fracture, right femur, initial encounter for fracture: Secondary | ICD-10-CM | POA: Diagnosis present

## 2023-01-16 DIAGNOSIS — J441 Chronic obstructive pulmonary disease with (acute) exacerbation: Secondary | ICD-10-CM | POA: Diagnosis not present

## 2023-01-16 DIAGNOSIS — C3490 Malignant neoplasm of unspecified part of unspecified bronchus or lung: Secondary | ICD-10-CM | POA: Diagnosis not present

## 2023-01-16 DIAGNOSIS — R0603 Acute respiratory distress: Secondary | ICD-10-CM

## 2023-01-16 DIAGNOSIS — Z515 Encounter for palliative care: Secondary | ICD-10-CM | POA: Diagnosis not present

## 2023-01-16 LAB — ECHOCARDIOGRAM COMPLETE
AR max vel: 2.35 cm2
AV Area VTI: 2.3 cm2
AV Area mean vel: 2.05 cm2
AV Mean grad: 3 mmHg
AV Peak grad: 4.8 mmHg
Ao pk vel: 1.09 m/s
Area-P 1/2: 6.9 cm2
Height: 71 in
S' Lateral: 1.9 cm
Weight: 2141.11 oz

## 2023-01-16 LAB — BASIC METABOLIC PANEL
Anion gap: 9 (ref 5–15)
BUN: 12 mg/dL (ref 8–23)
CO2: 22 mmol/L (ref 22–32)
Calcium: 8.5 mg/dL — ABNORMAL LOW (ref 8.9–10.3)
Chloride: 105 mmol/L (ref 98–111)
Creatinine, Ser: 0.51 mg/dL (ref 0.44–1.00)
GFR, Estimated: >60 mL/min (ref >60)
Glucose, Bld: 87 mg/dL (ref 70–99)
Potassium: 3.5 mmol/L (ref 3.5–5.1)
Sodium: 136 mmol/L (ref 135–145)

## 2023-01-16 LAB — BRAIN NATRIURETIC PEPTIDE: B Natriuretic Peptide: 27.4 pg/mL (ref 0.0–100.0)

## 2023-01-16 LAB — PHOSPHORUS: Phosphorus: 3 mg/dL (ref 2.5–4.6)

## 2023-01-16 LAB — MAGNESIUM: Magnesium: 1.8 mg/dL (ref 1.7–2.4)

## 2023-01-16 MED ORDER — METHYLPREDNISOLONE SODIUM SUCC 40 MG IJ SOLR
40.0000 mg | INTRAMUSCULAR | Status: DC
  Administered 2023-01-16 – 2023-01-17 (×2): 40 mg via INTRAVENOUS
  Filled 2023-01-16 (×2): qty 1

## 2023-01-16 MED ORDER — MORPHINE SULFATE (PF) 4 MG/ML IV SOLN: 4.0000 mg | INTRAVENOUS | Status: DC | PRN

## 2023-01-16 MED ORDER — IPRATROPIUM-ALBUTEROL 0.5-2.5 (3) MG/3ML IN SOLN
3.0000 mL | Freq: Two times a day (BID) | RESPIRATORY_TRACT | Status: DC
  Administered 2023-01-16 – 2023-01-18 (×4): 3 mL via RESPIRATORY_TRACT
  Filled 2023-01-16 (×4): qty 3

## 2023-01-16 MED ORDER — OXYCODONE HCL ER 10 MG PO T12A
10.0000 mg | EXTENDED_RELEASE_TABLET | Freq: Two times a day (BID) | ORAL | Status: DC
  Administered 2023-01-16 – 2023-01-18 (×4): 10 mg via ORAL
  Filled 2023-01-16 (×5): qty 1

## 2023-01-16 MED ORDER — LORAZEPAM 0.5 MG PO TABS
0.5000 mg | ORAL_TABLET | Freq: Two times a day (BID) | ORAL | Status: DC | PRN
  Administered 2023-01-17: 0.5 mg via ORAL
  Filled 2023-01-16 (×2): qty 1

## 2023-01-16 NOTE — Progress Notes (Signed)
Pt continues to refuse scheduled pain medication, oxycontin and and neurontin. Reports that she is "weaning her self off of all narcotics". Secure message sent for notification-Kelly Arbutus Ped, DO.

## 2023-01-16 NOTE — Progress Notes (Signed)
Progress Note   Patient: Brittney Fisher DOB: 12-02-1961 DOA: 01/11/2023     5 DOS: the patient was seen and examined on 01/16/2023   Brief hospital course: Brittney Fisher is a 62 y.o. female with medical history significant for COPD, GERD and metastatic lung cancer, who presented to the ED on evening of 01/11/2023 for evaluation of worsening dyspnea over a few days with associated cough productive of greenish sputum, and wheezing.  She also reported having nausea and vomiting with poor p.o. intake and ]abdominal pain.  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  No UTI symptoms.   ED Course: HR 113 initially, EKG sinus tachycardia.   Labs with mild hyponatremia and CO2 of 18 with anion gap of 16 and albumin 3.3 with otherwise unremarkable CMP.   Lactic acid normal x 2.  Hs-troponin normal x 2.   CT abdomen pelvis was essentially unremarkable but did show significant stool burden, distended gallbladder and anasarca and changes in lung bases consistent with her known lung cancer.    She was admitted to the hospital for further evaluation and management of COPD with acute exacerbation.  Started on bowel regimen for constipation.  RUQ U/S was negative.   Abdominal pain and GI complaints resolved after patient had bowel movement/s.    Being treated wit IV steroids, neb treatments, mucolytics. Breathing improving since admission.  Assessment and Plan: * COPD exacerbation ([place]) 1/27 >> 1/31 ongoing dyspnea but improving, diffuse wheezing improved but aeration remains poor, remains very dyspneic on minimal exertion --On IV Solu-Medrol reduce to 40 mg daily -- Z-pack for antiinflammatory --Scheduled and PRN bronchodilator / nebs --Continue Rocephin, Zithromax - O2 per protocol. Has been on room air. - Mucolytics, supportive care - Advair held on admission given systemic steroids --No prior echo available.  Ordered 2D echo for evaluation of  any underlying CHF or valvular disease contributing  Generalized abdominal pain Likely due to constipation as this resolved after BM's with bowel regimen started on admission.  Abdominal pelvic CT scan showed no acute finding except for distended gallbladder. Negative RUQ U/S, no acute cholecystitis or significant findings.   Hyponatremia Likely volume depletion and dehydration in differentiating with include paraneoplastic syndrome with her metastatic lung cancer. Improved with IV fluids. Sodium 136 from 133. --Monitor BMP --Off IV fluids  Pathological fracture of right femur ([facility]) Status post IM nail with Dr. Harlow Mares on 01/01/2023.  Notified Ortho of her admission. They plan to see her here for staple removal as planned at follow up. --Pain control --PT  Hypokalemia K 3.1 on 1/30 was replaced. Monitor BMP, replace K PRN  Hypomagnesemia Resolved w/ replacement 1/28, 1/30 Monitor Mg and replace PRN  Abnormal urinalysis No UTI symptoms. Got abx for possible bacterial bronchitis with COPD exacerbation.  Peripheral neuropathy Continue Neurontin.  Anxiety Continue home Xanax.  Metastatic lung cancer (metastasis from lung to other site) [facility] [place] resumed. Home pain regimen continued. Pt wants to wean herself down/off opiate therapy. She discussed this with me and her niece at bedside. Agreeable to scheduled 1000 mg Tylenol to help reduce need for PRN oxycodone. Started Movantik for Opioid-induced constipation Appreciate oncology palliative consult for pain management as pt trying to wean herself off opioid and showing signs of withdrawal and/or uncontrolled pain.        Subjective: Pt up in recliner when seen this AM.  She reports still being extremely short of breath with minimal exertion, just  getting to bathroom.  Her nurse informed me pt refusing her Oxycontin, so asked Palliative to see her about managing her pain from bone mets.  She reports increased  leg/hip pain.     Physical Exam: Vitals:   01/15/23 2050 01/15/23 2345 01/16/23 0740 01/16/23 0840  BP:  130/87  129/71  Pulse:  (!) 109  (!) 110  Resp:  16  14  Temp:  98.2 F (36.8 C)  97.9 F (36.6 C)  TempSrc:      SpO2: 95% 97% 93% 97%  Weight:      Height:       General exam: awake, alert, no acute distress, underweight HEENT: atraumatic, clear conjunctiva, anicteric sclera, moist mucus membranes, hearing grossly normal  Respiratory system: persistent severely diminished air entry, expiratory wheezes, diminished bases, mildly increased respiratory effort at rest, on room air Cardiovascular system: tachycardic, regular rhythm, no pedal edema.   Gastrointestinal system: soft, NT, ND Central nervous system: A&O x4. no gross focal neurologic deficits, normal speech Extremities: moves all, no edema, normal tone Psychiatry: normal mood, congruent affect, judgement and insight appear normal  Data Reviewed:  No new labs today   Family Communication: Niece ([facility]) at bedside on rounds 1/27, 1/30  Disposition: Status is: Inpatient Remains inpatient appropriate because: remains significantly dyspneic on minimal exertion with COPD exacerbation.  Needs further improvement.  Anticipate 1-2 more day.   Planned Discharge Destination: Home    Time spent: 36 minutes  Author: [name], DO 01/16/2023 2:15 PM  For on call review [email or web address].

## 2023-01-16 NOTE — Progress Notes (Signed)
*  PRELIMINARY RESULTS* Echocardiogram 2D Echocardiogram has been performed.  Sherrie Sport 01/16/2023, 2:09 PM

## 2023-01-16 NOTE — TOC Progression Note (Signed)
Transition of Care [facility]) - Progression Note    Patient Details  Name: Brittney Fisher MRN: [id number] Date of Birth: 09-22-1961  [facility] CM/SW [place], RN Phone Number: 01/16/2023, 11:04 AM  Clinical Narrative:    No additional TOC needs, TOC continues to follow   Expected Discharge Plan: Home/Self Care Barriers to Discharge: Continued Medical Work up  Expected Discharge Plan and Services     Post Acute Care Choice: NA Living arrangements for the past 2 months: Single Family Home                                       Social Determinants of Health (SDOH) Interventions SDOH Screenings   Food Insecurity: No Food Insecurity (01/12/2023)  Housing: Low Risk  (01/12/2023)  Transportation Needs: No Transportation Needs (01/12/2023)  Utilities: Not At Risk (01/12/2023)  Tobacco Use: Medium Risk (01/15/2023)    Readmission Risk Interventions    01/14/2023   10:20 AM  Readmission Risk Prevention Plan  Transportation Screening Complete  PCP or Specialist Appt within 3-5 Days Complete  Social Work Consult for [place] Planning/Counseling [place] Not Applicable  Medication Review [employer] photographer) Complete

## 2023-01-16 NOTE — Consult Note (Signed)
[place] at [facility] Telephone:(336) [phone] Fax:(336) [phone]   Name: Brittney Fisher Date: 01/16/2023 MRN: [id number]  DOB: [birth year]  Patient Care Team: Pcp, No as PCP - General Telford Nab, RN as Oncology Nurse Navigator [name], MD as Medical Oncologist (Oncology)    REASON FOR CONSULTATION: Brittney Fisher is a 62 y.o. 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  CT of the chest, abdomen, and pelvis on 12/24/2022 shows bilateral peripheral pulm nodules mildly increased in size with bilateral small pleural effusions. Patient has stable sclerotic metastasis in spine and pelvis with a lytic lesion in the right femoral neck concerning for possible impending pathologic fracture.  Patient underwent surgical pinning of the right femur on 01/01/2023.  She is now admitted to the hospital on 01/12/2019 for COPD exacerbation.  Palliative care was consulted to address goals and manage ongoing symptoms.  SOCIAL HISTORY:     reports that she quit smoking about 16 months ago. Her smoking use included cigarettes. She has never used smokeless tobacco. She reports that she does not drink alcohol and does not use drugs.  Patient is widowed.  She has no children.  She lives at home with a niece.  She has another niece and sister who are involved in her care.  Patient previously worked at [employer].   ADVANCE DIRECTIVES:  Patient's niece, [name], is her healthcare power of attorney.  CODE STATUS: DNR  PAST MEDICAL HISTORY: Past Medical History:  Diagnosis Date   COPD (chronic obstructive pulmonary disease) (HCC)    GERD (gastroesophageal reflux disease)    Metastatic lung cancer (metastasis from lung to other site) ([place]) 11/02/2020    PAST SURGICAL HISTORY:  Past Surgical History:  Procedure  Laterality Date   COLONOSCOPY WITH ESOPHAGOGASTRODUODENOSCOPY (EGD)     COLONOSCOPY WITH PROPOFOL N/A 11/14/2015   Procedure: COLONOSCOPY WITH PROPOFOL;  Surgeon: Hulen Luster, MD;  Location: [facility] ENDOSCOPY;  Service: Gastroenterology;  Laterality: N/A;   ESOPHAGOGASTRODUODENOSCOPY (EGD) WITH PROPOFOL N/A 11/14/2015   Procedure: ESOPHAGOGASTRODUODENOSCOPY (EGD) WITH PROPOFOL;  Surgeon: Hulen Luster, MD;  Location: [facility] ENDOSCOPY;  Service: Gastroenterology;  Laterality: N/A;   FEMUR IM NAIL Right 01/01/2023   Procedure: TREATMENT, INTERTROCHANTERIC FEMORAL FRACTURE, WITH INTRAMEDULLARY IMPLANT (SURG);  Surgeon: Lovell Sheehan, MD;  Location: [facility] ORS;  Service: Orthopedics;  Laterality: Right;    HEMATOLOGY/ONCOLOGY HISTORY:  Oncology History  Metastatic lung cancer (metastasis from lung to other site) ([place])  11/02/2020 Initial Diagnosis   Metastatic lung cancer (metastasis from lung to other site) [facility])   11/18/2020 - 09/29/2021 Chemotherapy   Patient is on Treatment Plan : LUNG PEMEtrexed (Alimta) + CARBOplatin + Bevacizumab q21d x 1 cycle      03/20/2022 Imaging   CT chest abdomen pelvis w contrast 1. Unchanged circumferential pleural thickening and pleural nodularity of the right hemithorax, left infrahilar mass, and bilateral pulmonary nodules. 2. Stable right perihilar consolidation, likely post radiation changes. 3. Unchanged numerous sclerotic osseous lesions.4.  Aortic Atherosclerosis (ICD10-I70.0).    06/08/2022 Imaging   MRI lumbar spine w and wo 1. Unchanged appearance of multiple sclerotic lesions involving all lumbar levels, consistent with osseous metastatic disease.2. Severe right L5-S1 neural foraminal stenosis.3. Mild right L4-5 neural foraminal stenosis  MR Sacrum SI Joints w wo contrast 1. Multiple sclerotic lesions of sacrum and bilateral ilia with enhancement  on post contrast sequences consistent with metastatic disease.2.  No evidence of sacroiliitis or joint effusion. 3.   No significant finding of the neurovascular bundle bilaterally     Imaging     Primary malignant neoplasm of lung metastatic to other site [facility])  11/04/2020 Initial Diagnosis   Primary malignant neoplasm of lung metastatic to other site [facility])  10/20/2020, patient was brought to ED via EMS due to generalized weakness, dizziness, chest pain shortness of breath.  She was recently diagnosed with COPD approximately 1 month ago by primary care provider.  Also unintentional weight loss during the last few months. Image work-up showed right-sided pleural effusion.  She underwent right thoracentesis.  With removal of 2.4 L of fluid. 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  Metastatic disease to low cervical/thoracic nodes, lung, right pleural space and bones.  Moderate to large right pleural effusion.  SVC narrowing. 10/21/2020 MRI brain is negative for metastasis.  Mild chronic microvascular ischemic changes in the white matter.  Negative for acute infarct Regarding to the SVC narrowing, patient was seen by vascular surgeon and was recommended no intervention inpatient.  Patient to follow-up outpatient with vascular surgeon for evaluation. Patient also was treated for COPD exacerbation with hypoxia on exertion. Qualifies for home oxygen and hospitalist arrange home health and a nebulizer.  Patient was given a course of prednisone taper and empiric Levaquin. Patient was discharged and present today to follow-up with cytology results and further management plan   10/27/2020 PET scan showed right upper lobe primary bronchogenic mass with direct invasion into mediastinum. Metastatic disease to the right pleural space, lung, bone, nodes of the chest and less so lower leg/upper abdomen. Hypermetabolic him along the course of the right axillary vein with concurrent subtle hyper attenuation within the axillary vein and  SVC.  This continues to the level of SVC narrowing.  Suspicious for SVC occlusion and developing thrombus. Moderate right pleural effusion and small pericardial effusion.   # # left supraclavicular mass biopsy- pathology is positive for adenocarcinoma. positive for CK7, with patchy, weak CK20.  They are  negative for TTF1, NapsinA, GATA3, CDX2, and Pax8.  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  #NGS showed AXIN 1 [id number], DIS2 M126fs, KRAS G12C, PRDM1 M1 [id number]*, [id number], MS stable, TPS <1%      11/04/2020 Cancer Staging   Staging form: Lung, AJCC 8th Edition - Clinical stage from 11/04/2020: Stage IV (cT4, cN3, cM1) - Signed by [name], MD on 11/04/2020   11/14/2020 - 12/12/2020 Radiation Therapy   #Bronchial obstruction and SVC occlusion.  s/p palliative radiation   11/18/2020 - 03/22/2022 Chemotherapy   carboplatin/Alimta/bevacizumab x 6 cycles   Infusion reaction of carboplatin during cycle 6.  Carboplatin discontinued.   04/07/2021 Imaging   CT chest abdomen pelvis-partial response Mild decrease in size of right lung mass, mildly decreased mediastinal and hilar adenopathy.  Extensive right-sided pleural metastasis unchanged.  Similar appearance of diffuse bilateral pulmonary nodules.  Multifocal lytic sclerotic bone metastasis.  New superior endplate deformity of T6   04/12/2021 - 09/29/2021 Chemotherapy   04/12/2021 - 09/08/2021 bevacizumab and Alimta.  09/29/2021 extra cycle of maintenance bevacizumab and Alimta while waiting for approval of Lumakras   09/21/2021 Imaging    bone scan  showed multifocal abnormal uptake including mid to distal shaft of right femur, left femoral neck and trochanter, left sacroiliac region, left ischium.  Focal activity at the proximal tibia on the left.   Multi focal heterogeneous bilateral rib activity.  Focal activity at the sternum.   09/27/2021 Imaging   CT chest abdomen pelvis with contrast showed progression of infrahilar left lower lobe with a concerning clearly progressive masslike consolidation opacity.  Mild progression of small left pleural effusion.  Similar experience of extensive irregular right-sided pleural disease.  Similar experience of bony metastasis.    10/20/2021 -  Chemotherapy   Started on Lumakras   01/09/2022 Imaging   CT chest abdomen pelvis with contrast showed no substantial interval changes in exam.  Stable disease   03/20/2022 -  Chemotherapy   CT chest abdomen pelvis with contrast showed unchanged circumferential pleural thickening/pleural nodularity of the right hemithorax, left infrahilar mass and bilateral pulmonary nodules.  Stable right perihilar consolidation likely postradiation changes.  Unchanged numerous sclerotic osseous lesions.  Aortic atherosclerosis.     06/08/2022 Imaging   MRI sacrum SI joint w wo contrast Showed 1. Multiple sclerotic lesions of sacrum and bilateral ilia with enhancement on post contrast sequences consistent with metastatic disease. 2.  No evidence of sacroiliitis or joint effusion.3.  No significant finding of the neurovascular bundle bilaterally    06/08/2022 Imaging   MRI lumbar spine w wo contrast 1. Unchanged appearance of multiple sclerotic lesions involving all lumbar levels, consistent with osseous metastatic disease.2. Severe right L5-S1 neural foraminal stenosis.3. Mild right L4-5 neural foraminal stenosis    07/26/2022 Imaging   CT chest abdomen pelvis with contrast 1. Redemonstrated post treatment appearance of the chest with masslike consolidation and fibrosis of the perihilar right lung , mass of the superior segment left lower lobe, as well as extensive bilateral pleural thickening and nodularity, greater on the right, and numerous bilateral pulmonary nodules. 2.  Increased nodularity about the right lung base, consistent with worsened pleural metastatic disease. Other nodules unchanged. 3. Unchanged post treatment appearance of mediastinal and right hilar lymph nodes as well as epicardial lymph nodes about the left ventricular apex. 4. Slightly enlarged lytic osseous metastatic lesion of the base of the right femoral neck measuring 2.4 x 2.2 cm, previously 2.2 x 1.8 cm. This is potentially at risk for pathologic fracture given location.5. Otherwise unchanged widespread sclerotic osseous metastatic disease throughout the axial and proximal appendicular skeleton. 6. No evidence of soft tissue metastatic disease to the abdomen or pelvis. 6. No evidence of soft tissue metastatic disease to the abdomen or pelvis.   12/24/2022 Imaging   CT chest abdomen pelvis w contrast  CHEST IMPRESSION:   1. Bilateral peripheral pleural nodules are mildly increased in size. 2. Bilateral small effusions. Stable consolidation at the LEFT lung Base  3. Stable ill-defined tissue in the mediastinum suggesting treated tumor infiltrate.   PELVIS IMPRESSION:   1. No evidence of metastatic disease in the abdomen pelvis. 2. Stable sclerotic metastasis in the spine and pelvis. 3. Stable lytic lesion in the RIGHT femoral neck may be at risk for pathologic fracture. 4.  Aortic Atherosclerosis (ICD10-I70.0)     ALLERGIES:  is allergic to carboplatin.  MEDICATIONS:  Current Facility-Administered Medications  Medication Dose Route Frequency Provider Last Rate Last Admin   acetaminophen (TYLENOL) tablet 1,000 mg  1,000 mg Oral Q8H Griffith, Kelly A, DO   1,000 mg at 01/16/23 0540   azithromycin (ZITHROMAX) tablet 250 mg  250  mg Oral Daily Nicole Kindred A, DO   250 mg at 01/16/23 1032   benzonatate (TESSALON) capsule 100 mg  100 mg Oral TID PRN Mansy, Jan A, MD   100 mg at 01/14/23 1006   chlorpheniramine-HYDROcodone (TUSSIONEX) 10-8 MG/5ML suspension 5 mL  5 mL Oral Q12H PRN Mansy,  Jan A, MD   5 mL at 01/14/23 1007   enoxaparin (LOVENOX) injection 40 mg  40 mg Subcutaneous Q24H Mansy, Jan A, MD   40 mg at 53/61/44 3154   folic acid (FOLVITE) tablet 1 mg  1 mg Oral Daily Mansy, Jan A, MD   1 mg at 01/14/23 1006   gabapentin (NEURONTIN) capsule 100 mg  100 mg Oral TID Mansy, Jan A, MD   100 mg at 01/13/23 2148   guaiFENesin (MUCINEX) 12 hr tablet 600 mg  600 mg Oral BID Mansy, Jan A, MD   600 mg at 01/16/23 1032   ipratropium-albuterol (DUONEB) 0.5-2.5 (3) MG/3ML nebulizer solution 3 mL  3 mL Nebulization TID Nicole Kindred A, DO   3 mL at 01/16/23 0740   levalbuterol (XOPENEX) nebulizer solution 0.63 mg  0.63 mg Nebulization Q6H PRN Nicole Kindred A, DO       methylPREDNISolone sodium succinate (SOLU-MEDROL) 40 mg/mL injection 40 mg  40 mg Intravenous Q24H Nicole Kindred A, DO       morphine (PF) 4 MG/ML injection 4 mg  4 mg Intravenous Q4H PRN Nicole Kindred A, DO       naloxegol oxalate (MOVANTIK) tablet 25 mg  25 mg Oral Daily Nicole Kindred A, DO   25 mg at 01/14/23 1006   naloxone (NARCAN) nasal spray 4 mg/0.1 mL  1 spray Nasal PRN Mansy, Jan A, MD       ondansetron [facility]) tablet 4 mg  4 mg Oral Q6H PRN Mansy, Jan A, MD       Or   ondansetron [facility]) injection 4 mg  4 mg Intravenous Q6H PRN Mansy, Jan A, MD   4 mg at 01/15/23 1948   oxyCODONE (Oxy IR/ROXICODONE) immediate release tablet 5-10 mg  5-10 mg Oral Q4H PRN Mansy, Jan A, MD       oxyCODONE (OXYCONTIN) 12 hr tablet 10 mg  10 mg Oral Q12H Vicente Weidler, Kirt Boys, NP       protein supplement (PREMIER PROTEIN) liquid - approved for s/p bariatric surgery  11 oz Oral Daily Mansy, Jan A, MD   11 oz at 01/16/23 1032   psyllium (HYDROCIL/METAMUCIL) 1 packet  1 packet Oral Daily Nicole Kindred A, DO   1 packet at 01/16/23 1032   traZODone (DESYREL) tablet 25 mg  25 mg Oral QHS PRN Mansy, Jan A, MD   25 mg at 01/15/23 2113    VITAL SIGNS: BP 129/71 (BP Location: Right Arm)   Pulse (!) 110   Temp 97.9 F (36.6 C)    Resp 14   Ht 5\' 11"  (1.803 m)   Wt 133 lb 13.1 oz (60.7 kg)   SpO2 97%   BMI 18.66 kg/m  Filed Weights   01/11/23 1735  Weight: 133 lb 13.1 oz (60.7 kg)    Estimated body mass index is 18.66 kg/m as calculated from the following:   Height as of this encounter: 5\' 11"  (1.803 m).   Weight as of this encounter: 133 lb 13.1 oz (60.7 kg).  LABS: CBC:    Component Value Date/Time   WBC 6.0 01/12/2023 0500   HGB 10.6 (L) 01/12/2023 0500  HCT 33.7 (L) 01/12/2023 0500   PLT 228 01/12/2023 0500   MCV 87.1 01/12/2023 0500   NEUTROABS 4.6 12/21/2022 0906   LYMPHSABS 0.8 12/21/2022 0906   MONOABS 0.6 12/21/2022 0906   EOSABS 0.1 12/21/2022 0906   BASOSABS 0.0 12/21/2022 0906   Comprehensive Metabolic Panel:    Component Value Date/Time   NA 136 01/16/2023 0628   K 3.5 01/16/2023 0628   CL 105 01/16/2023 0628   CO2 22 01/16/2023 0628   BUN 12 01/16/2023 0628   CREATININE 0.51 01/16/2023 0628   GLUCOSE 87 01/16/2023 0628   CALCIUM 8.5 (L) 01/16/2023 0628   AST 17 01/11/2023 1738   ALT 10 01/11/2023 1738   ALKPHOS 58 01/11/2023 1738   BILITOT 0.8 01/11/2023 1738   PROT 7.6 01/11/2023 1738   ALBUMIN 3.3 (L) 01/11/2023 1738    RADIOGRAPHIC STUDIES: DG Chest Port 1 View  Result Date: 01/15/2023 CLINICAL DATA:  Shortness of breath.  COPD.  Metastatic lung cancer. EXAM: PORTABLE CHEST 1 VIEW COMPARISON:  01/11/2023 FINDINGS: Heart size remains normal. Chronic volume loss and scarring in the right upper chest remains stable. Chronic bilateral pleural blunting remains stable. Chronic volume loss/infiltrate in both lower lobes. Compared to the study of 4 days ago, findings appear quite similar. No new process is identified. IMPRESSION: No significant change since the study of 4 days ago. Chronic volume loss and scarring in the right upper chest. 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 Electronically Signed   By: Nelson Chimes M.D.   On:  01/15/2023 08:07   US Abdomen Limited RUQ (LIVER/GB)  Result Date: 01/12/2023 CLINICAL DATA:  62 year old female with history of abdominal pain. EXAM: ULTRASOUND ABDOMEN LIMITED RIGHT UPPER QUADRANT COMPARISON:  No priors. FINDINGS: Gallbladder: Gallbladder is moderately distended. No evidence of gallstones or biliary sludge. Gallbladder wall thickness is normal. No pericholecystic fluid. Per report from the sonographer, there was no sonographic Murphy's sign on examination. Common bile duct: Diameter: 3.4 mm. Liver: No focal lesion identified. Within normal limits in parenchymal echogenicity. Portal vein is patent on color Doppler imaging with normal direction of blood flow towards the liver. Other: Incidental imaging of the right kidney demonstrates an anechoic lesion with increased through transmission in the interpolar region, compatible with a large parapelvic cyst, estimated to measure approximately 4.3 x 4.0 x 2.8 cm (no imaging follow-up recommended). IMPRESSION: 1. No acute findings. Specifically, no gallstones and no evidence of acute cholecystitis. Electronically Signed   By: Vinnie Langton M.D.   On: 01/12/2023 05:42   CT Angio Chest PE W and/or Wo Contrast  Result Date: 01/11/2023 CLINICAL DATA:  Pulmonary embolism (PE) suspected, high prob Patient with history of metastatic lung cancer. EXAM: CT ANGIOGRAPHY CHEST WITH CONTRAST TECHNIQUE: Multidetector CT imaging of the chest was performed using the standard protocol during bolus administration of intravenous contrast. Multiplanar CT image reconstructions and MIPs were obtained to evaluate the vascular anatomy. RADIATION DOSE REDUCTION: This exam was performed according to the departmental dose-optimization program which includes automated exposure control, adjustment of the mA and/or kV according to patient size and/or use of iterative reconstruction technique. CONTRAST:  5mL OMNIPAQUE IOHEXOL 350 MG/ML SOLN COMPARISON:  Radiograph earlier  today. Staging chest CT 12/24/2022 FINDINGS: Cardiovascular: There are no filling defects within the pulmonary arteries to suggest pulmonary embolus. Aortic atherosclerosis and tortuosity. No acute aortic findings. Stable heart size. Trace pericardial effusion. Bilateral anterior chest wall collaterals, however no evidence of SVC occlusion. Mediastinum/Nodes:  Ill-defined soft tissue planes within the mediastinum again seen without significant interval change. Ill-defined soft tissue density seen anterior to the trachea, at the level of the carina and adjacent to the right mainstem bronchus. 14 mm central left hilar lymph node versus pulmonary nodule. Patulous esophagus. Lungs/Pleura: Again seen pleural thickening the right lung apex, near circumferential throughout the right hemithorax with areas of pleural nodularity. Areas of peripheral pleural based nodularity in the right hemithorax are again seen. Representative right lower lobe pleural base nodule measures 15 x 12 mm series 7, image 97, unchanged by my retrospective measurement. Peri diaphragmatic nodule in the right lung base measures 14 mm series 7, image 111, also unchanged. Pleural based right middle lobe nodule measures 11 mm series 7, image 109, unchanged. Interstitial nodularity throughout the right lower lobe with bronchial thickening is stable. Ill-defined soft tissue density in the central right perihilar lung is unchanged. Small right pleural effusion. Consolidation in the left lower lobe, most prominent centrally, with heterogeneous parenchyma, stable from prior. Subpleural left upper lobe nodule measures 12 mm series 7, image 102, previously 10 mm. Subpleural left upper lobe nodule measuring 7 mm series 7, image 58, previously 6 mm. Similar left pleural thickening with small left pleural effusion. No definite acute airspace disease. Upper Abdomen: Assessed on abdominopelvic CT earlier today. Musculoskeletal: Multifocal sclerotic osseous lesions  typical of metastatic disease. Exaggerated upper thoracic kyphosis. Unchanged upper thoracic mild compression deformities. No new osseous findings. Review of the MIP images confirms the above findings. IMPRESSION: 1. No pulmonary embolus. 2. History of known lung cancer, with stable appearance of the chest from earlier this month. Recommend continued oncologic follow-up per protocol. 3. Ill-defined soft tissue planes within the mediastinum and central right lung are unchanged from prior exam. 4. Stable osseous metastatic disease. Aortic Atherosclerosis (ICD10-I70.0). Electronically Signed   By: Keith Rake M.D.   On: 01/11/2023 21:21   CT ABDOMEN PELVIS WO CONTRAST  Result Date: 01/11/2023 CLINICAL DATA:  Left lower quadrant abdominal pain. 2 days. History of non-small-cell lung cancer. EXAM: CT ABDOMEN AND PELVIS WITHOUT CONTRAST TECHNIQUE: Multidetector CT imaging of the abdomen and pelvis was performed following the standard protocol without IV contrast. RADIATION DOSE REDUCTION: This exam was performed according to the departmental dose-optimization program which includes automated exposure control, adjustment of the mA and/or kV according to patient size and/or use of iterative reconstruction technique. COMPARISON:  CT 12/24/2022. older CT sidewall. X-ray 01/11/2023 earlier FINDINGS: Lower chest: Once again at the lung bases are pleural effusions, pleural thickening and masslike areas with reticulonodular changes, similar to the prior examination. Please correlate with prior chest CT and clinical history of lung cancer. Pericardial effusion is also seen with some enlarged pre cardiophrenic lymph nodes Hepatobiliary: On this non IV contrast exam, the liver is grossly preserved. The gallbladder is distended but unchanged from prior. Pancreas: Grossly the pancreas has preserved parenchyma. Spleen: Spleen is nonenlarged. Adrenals/Urinary Tract: Stable mild thickening of the adrenal glands. Bilateral  Bosniak 1 renal cysts are stable. No specific imaging follow-up of the cysts in the kidneys. No renal or ureteral stones clearly seen. Contracted urinary bladder. Stomach/Bowel: Stomach is nondilated. There is some luminal air and fluid. There is some high attenuation seen along the course of the colon with scattered stool. The colon is nondilated. The cecum is in the low right hemipelvis. Very redundant course to loops of colon. Normal appendix identified posterior cecum in the right hemipelvis as seen on coronal series 4, image 39.  No clear adjacent inflammatory stranding along the colon. Small bowel is nondilated. Vascular/Lymphatic: Diffuse vascular calcifications along the aorta and branch vessels. Grossly preserved caliber IVC. Reproductive: Uterus and bilateral adnexa are unremarkable. There are some calcifications along the uterus, possible calcified fibroids. Other: Anasarca.  No clear ascites. Musculoskeletal: Scattered sclerotic bone metastases are identified. Please correlate with prior workup. Interval placement of dynamic right hip screw with acute surgical changes. Lytic lesion along the right femoral neck is again seen. Stranding. IMPRESSION: Study limited without the advantage of contrast and lack of intra-abdominal fat. No bowel obstruction, free air or free fluid. Diffuse scattered colonic stool. Normal appendix. Anasarca. Interval placement of a dynamic right hip screw transfixing the lucent lesion in the femoral neck. There is sclerotic bone metastases elsewhere as per prior history. Distended gallbladder but unchanged from previous. If there is concern of acute gallbladder pathology ultrasound may be useful. Extensive abnormality along the lung bases consistent with known history of lung cancer. Please correlate with recent workup. Electronically Signed   By: Jill Side M.D.   On: 01/11/2023 19:32   DG Chest Portable 1 View  Result Date: 01/11/2023 CLINICAL DATA:  Shortness of breath.  Cancer. Evaluate for effusion or edema. EXAM: PORTABLE CHEST 1 VIEW COMPARISON:  Radiograph 12/21/2022. CT 12/24/2022 FINDINGS: Chronic volume loss in the right hemithorax. Chronic right apical opacity, ill-defined right basilar opacities and right basilar effusion. Left infrahilar opacity and small left effusion are similar to prior CT. No pulmonary edema. Stable heart size and mediastinal contours. No pneumothorax. Scattered sclerotic lesions are better appreciated on prior CT. IMPRESSION: 1. Stable radiographic appearance of the chest. Stable right lung volume loss with apical and basilar opacity and right pleural effusion from earlier this month. 2. Stable left basilar opacity and small left pleural effusion. 3. No pulmonary edema. Electronically Signed   By: Keith Rake M.D.   On: 01/11/2023 19:19   DG Abdomen 1 View  Result Date: 01/11/2023 CLINICAL DATA:  Constipation. Patient reports abdominal pain. EXAM: ABDOMEN - 1 VIEW COMPARISON:  CT 12/24/2022 FINDINGS: Added views of the abdomen obtained. No bowel dilatation to suggest obstruction. Moderate volume of stool in the colon, mild gaseous distension of splenic flexure. No radiopaque calculi or abnormal soft tissue calcifications. Surgical hardware in the right proximal femur is partially visualized. Stable sclerotic densities within the left proximal femur. Sclerotic lesions within the bony pelvis are only faintly visualized by radiograph. Known sclerotic metastatic disease. IMPRESSION: Nonobstructive bowel gas pattern. Moderate volume of colonic stool. Electronically Signed   By: Keith Rake M.D.   On: 01/11/2023 18:10   DG FEMUR, MIN 2 VIEWS RIGHT  Result Date: 01/01/2023 CLINICAL DATA:  Right intramedullary nail EXAM: RIGHT FEMUR 2 VIEWS COMPARISON:  None Available. FINDINGS: Fluoroscopic images were obtained intraoperatively and submitted for post operative interpretation. Right intramedullary nail placement, 10 images were obtained  with 1 minute and 27 seconds of fluoroscopy time and 8.23 mGy. Please see the performing provider's procedural report for further detail. IMPRESSION: Intraoperative fluoroscopic guidance for right intramedullary nail placement. Electronically Signed   By: Yetta Glassman M.D.   On: 01/01/2023 14:57   DG C-Arm 1-60 Min-No Report  Result Date: 01/01/2023 Fluoroscopy was utilized by the requesting physician.  No radiographic interpretation.   DG C-Arm 1-60 Min-No Report  Result Date: 01/01/2023 Fluoroscopy was utilized by the requesting physician.  No radiographic interpretation.   CT CHEST ABDOMEN PELVIS W CONTRAST  Result Date: 12/24/2022 CLINICAL DATA:  Non-small  cell lung cancer. Assess treatment response. Initial diagnosis November 2021. Chemotherapy radiation therapy. Immunotherapy completed. * Tracking Code: BO * EXAM: CT CHEST, ABDOMEN, AND PELVIS WITH CONTRAST TECHNIQUE: Multidetector CT imaging of the chest, abdomen and pelvis was performed following the standard protocol during bolus administration of intravenous contrast. RADIATION DOSE REDUCTION: This exam was performed according to the departmental dose-optimization program which includes automated exposure control, adjustment of the mA and/or kV according to patient size and/or use of iterative reconstruction technique. CONTRAST:  61mL OMNIPAQUE IOHEXOL 300 MG/ML  SOLN COMPARISON:  03/20/2022 FINDINGS: CT CHEST FINDINGS Cardiovascular: No significant vascular findings. Normal heart size. No pericardial effusion. Mediastinum/Nodes: Ill-defined tissue planes within the mediastinum consistent with tumor infiltrate. No change from comparison exam. No axillary supraclavicular adenopathy Lungs/Pleura: Peripheral nodules in the RIGHT lower lobe and along the RIGHT lower lobe pleural surface. For example lateral nodule in the RIGHT lower lobe along the pleural surface measures 16 mm (image 86/3 compared to 12 mm. Nodule over the RIGHT hemidiaphragm  measuring 13 mm (image 104/3) compares to 11 mm. Nodule in the RIGHT middle lobe pleural surface measures 11 mm (97/3 compared to 11 mm. There is bronchiectasis and peribronchial thickening in the RIGHT lobe similar prior. Several new peripheral nodules in the LEFT lung. For example LEFT upper lobe nodule measuring 10 mm (image 91/3) compares to 6 mm. Consolidation in the LEFT lower lobe is similar prior. RIGHT upper lobe nodule measuring 6 mm compares to 5 (image 46/3 Musculoskeletal: Stable sclerotic lesions in the thoracic spine CT ABDOMEN AND PELVIS FINDINGS Hepatobiliary: No focal hepatic lesion. No biliary ductal dilatation. Gallbladder is normal. Common bile duct is normal. Pancreas: Pancreas is normal. No ductal dilatation. No pancreatic inflammation. Spleen: Normal spleen Adrenals/urinary tract: Adrenal glands and kidneys are normal. The ureters and bladder normal. Stomach/Bowel: Stomach, small bowel, appendix, and cecum are normal. The colon and rectosigmoid colon are normal. Vascular/Lymphatic: Abdominal aorta is normal caliber with atherosclerotic calcification. There is no retroperitoneal or periportal lymphadenopathy. No pelvic lymphadenopathy. Reproductive: Uterus and adnexa unremarkable. Other: No peritoneal metastasis Musculoskeletal: Multiple round sclerotic lesions in the proximal femurs, iliac bones, sacrum and lower thoracic spine are unchanged. Lytic lesion within the RIGHT femoral neck measures 2.7 cm unchanged IMPRESSION: CHEST IMPRESSION: 1. Bilateral peripheral pleural nodules are mildly increased in size. 2. Bilateral small effusions. Stable consolidation at the LEFT lung base 3. Stable ill-defined tissue in the mediastinum suggesting treated tumor infiltrate. PELVIS IMPRESSION: 1. No evidence of metastatic disease in the abdomen pelvis. 2. Stable sclerotic metastasis in the spine and pelvis. 3. Stable lytic lesion in the RIGHT femoral neck may be at risk for pathologic fracture. 4.   Aortic Atherosclerosis (ICD10-I70.0). Electronically Signed   By: Suzy Bouchard M.D.   On: 12/24/2022 14:06   [place] CHEST (PLEURAL EFFUSION)  Result Date: 12/21/2022 CLINICAL DATA:  Pleural effusion EXAM: CHEST ULTRASOUND COMPARISON:  None Available. FINDINGS: Focused sonographic exam of the bilateral chest was performed for fluid assessment. Images demonstrate no significant pleural effusion on either the right or left side. No thoracentesis performed. IMPRESSION: No significant pleural effusion appreciated by ultrasound on either side. No thoracentesis performed. Electronically Signed   By: Albin Felling M.D.   On: 12/21/2022 12:32   DG Chest 2 View  Result Date: 12/21/2022 CLINICAL DATA:  History of lung cancer with several week history of shortness of breath EXAM: CHEST - 2 VIEW COMPARISON:  CT chest dated 10/12/2022 chest radiograph dated 12/30/2020 FINDINGS: Slightly low lung  volumes. Persistent right apical opacity and asymmetric patchy right lung opacities. Multifocal right lung nodules are better evaluated on prior chest CT. New peripheral left upper lung nodular opacity. Small to moderate right pleural effusion. Trace left pleural effusion. The heart size and mediastinal contours are within normal limits. The visualized skeletal structures are unremarkable. IMPRESSION: 1. Persistent right apical opacity and asymmetric patchy right lung opacities. Multifocal right lung nodules are better evaluated on prior chest CT. 2. New peripheral left upper lung nodular opacity may represent a focus of infection or metastasis. 3. Small to moderate right pleural effusion. Trace left pleural effusion. Electronically Signed   By: Darrin Nipper M.D.   On: 12/21/2022 09:03    PERFORMANCE STATUS (ECOG) : 2 - Symptomatic, <50% confined to bed  Review of Systems Unless otherwise noted, a complete review of systems is negative.  Physical Exam General: NAD Cardiovascular: regular rate and rhythm Pulmonary: clear ant  fields Abdomen: soft, nontender, + bowel sounds GU: no suprapubic tenderness Extremities: no edema, no joint deformities Skin: no rashes Neurological: Weakness but otherwise nonfocal  IMPRESSION: Patient was admitted with COPD exacerbation.  Breathing has improved.  Patient is ambulatory in the hallways with PT.  However, she endorses persistent and at times severe pain.  She has been reluctant to take opioid pain medications due to constipating effects and has declined several days of OxyContin and oxycodone.  She has had several doses of intermittent morphine in addition to scheduled acetaminophen.  Patient admits that her pain is poorly controlled.  However, she says that she would like to wean off the opioid pain medications.  Had a long conversation with her about the risks and benefits of opioid pain medications, particularly in the setting of neoplasm related pain.  Primarily, she is most concerned about constipating effects of opioids and I explained that we can control this effectively with an aggressive bowel regimen.   Patient said that she would be willing to retry OxyContin but at a lower dose.  She would be willing to restart oxycodone IR as needed for breakthrough pain.  Patient says she has fear that she will require O2 at some point in the future and not have it available.  Explained that we would have to have qualifying SpO2 documented for insurance to again cover home O2.  PLAN: -Continue current scope of treatment -Restart OxyContin at reduced dose of 10 mg every 12 hours  -Oxycodone 5 to 10 mg every 4 hours as needed for breakthrough pain -Agree with scheduled acetaminophen -Recommend aggressive bowel regimen with MiraLAX/senna -Movantik if needed -Will follow-up with patient in clinic   Time Total: 30 minutes  Visit consisted of counseling and education dealing with the complex and emotionally intense issues of symptom management and palliative care in the setting of  serious and potentially life-threatening illness.Greater than 50%  of this time was spent counseling and coordinating care related to the above assessment and plan.  Signed by: Altha Harm, PhD, NP-C

## 2023-01-16 NOTE — Assessment & Plan Note (Signed)
Status post IM nail with Dr. Harlow Mares on 01/01/2023.  Notified Ortho of her admission. They plan to see her here for staple removal as planned at follow up. --Pain control --PT

## 2023-01-17 ENCOUNTER — Inpatient Hospital Stay: Payer: Medicaid Other

## 2023-01-17 DIAGNOSIS — J441 Chronic obstructive pulmonary disease with (acute) exacerbation: Secondary | ICD-10-CM | POA: Diagnosis not present

## 2023-01-17 LAB — BASIC METABOLIC PANEL
Anion gap: 8 (ref 5–15)
BUN: 11 mg/dL (ref 8–23)
CO2: 27 mmol/L (ref 22–32)
Calcium: 8.9 mg/dL (ref 8.9–10.3)
Chloride: 100 mmol/L (ref 98–111)
Creatinine, Ser: 0.54 mg/dL (ref 0.44–1.00)
GFR, Estimated: >60 mL/min (ref >60)
Glucose, Bld: 93 mg/dL (ref 70–99)
Potassium: 4.1 mmol/L (ref 3.5–5.1)
Sodium: 135 mmol/L (ref 135–145)

## 2023-01-17 LAB — CBC
HCT: 36.5 % (ref 36.0–46.0)
Hemoglobin: 11.5 g/dL — ABNORMAL LOW (ref 12.0–15.0)
MCH: 27.4 pg (ref 26.0–34.0)
MCHC: 31.5 g/dL (ref 30.0–36.0)
MCV: 87.1 fL (ref 80.0–100.0)
Platelets: 255 10*3/uL (ref 150–400)
RBC: 4.19 MIL/uL (ref 3.87–5.11)
RDW: 15.8 % — ABNORMAL HIGH (ref 11.5–15.5)
WBC: 7.1 10*3/uL (ref 4.0–10.5)
nRBC: 0 % (ref 0.0–0.2)

## 2023-01-17 LAB — PHOSPHORUS: Phosphorus: 3.1 mg/dL (ref 2.5–4.6)

## 2023-01-17 LAB — MAGNESIUM: Magnesium: 1.7 mg/dL (ref 1.7–2.4)

## 2023-01-17 MED ORDER — IOHEXOL 350 MG/ML SOLN
75.0000 mL | Freq: Once | INTRAVENOUS | Status: AC | PRN
  Administered 2023-01-17: 75 mL via INTRAVENOUS

## 2023-01-17 NOTE — Progress Notes (Addendum)
Progress Note   Patient: Brittney Fisher TXM:[id number] DOB: [birth year] DOA: 01/11/2023     6 DOS: the patient was seen and examined on 01/17/2023   Brief hospital course: Brittney Fisher is a 62 y.o. female with medical history significant for COPD, GERD and metastatic lung cancer, who presented to the ED on evening of 01/11/2023 for evaluation of worsening dyspnea over a few days with associated cough productive of greenish sputum, and wheezing.  She also reported having nausea and vomiting with poor p.o. intake and ]abdominal pain.  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  No UTI symptoms.   ED Course: HR 113 initially, EKG sinus tachycardia.   Labs with mild hyponatremia and CO2 of 18 with anion gap of 16 and albumin 3.3 with otherwise unremarkable CMP.   Lactic acid normal x 2.  Hs-troponin normal x 2.   CT abdomen pelvis was essentially unremarkable but did show significant stool burden, distended gallbladder and anasarca and changes in lung bases consistent with her known lung cancer.    She was admitted to the hospital for further evaluation and management of COPD with acute exacerbation.  Started on bowel regimen for constipation.  RUQ U/S was negative.   Abdominal pain and GI complaints resolved after patient had bowel movement/s.    Being treated wit IV steroids, neb treatments, mucolytics. Breathing improving since admission.  01/17/2023: The patient was seen and examined at bedside.  Conversational dyspnea noted.  Denies any chest pain however she is dyspneic and tachycardic.  CTA PE done on 01/11/2023 was negative for PE.  Still tachycardic with minimal movement.  Heart rate in 140s.  Will repeat CTA to rule out PE.  CBC added to lab from this morning, pending.  Assessment and Plan: COPD exacerbation (Brittney Fisher) 1/27 >> 1/31 ongoing dyspnea but improving, diffuse wheezing improved but aeration remains poor, remains very dyspneic on minimal  exertion --On IV Solu-Medrol reduce to 40 mg daily -- Z-pack for antiinflammatory --Scheduled and PRN bronchodilator / nebs --Continue Rocephin, Zithromax - O2 per protocol. Has been on room air. - Mucolytics, supportive care - Advair held on admission given systemic steroids --No prior echo available.  Ordered 2D echo for evaluation of any underlying CHF or valvular disease contributing 2D echo done on 01/16/2023 with LVEF 60 to 65% with grade 1 diastolic dysfunction  Persistent tachycardia, creatinine urology May be related to her lung cancer Due to high risk of thrombosis, will rule out pulmonary embolism with repeat CTA chest Follow CBC, pending  Generalized abdominal pain Likely due to constipation as this resolved after BM's with bowel regimen started on admission.  Abdominal pelvic CT scan showed no acute finding except for distended gallbladder. Negative RUQ U/S, no acute cholecystitis or significant findings.   Resolved hyponatremia Likely volume depletion and dehydration in differentiating with include paraneoplastic syndrome with her metastatic lung cancer. Improved with IV fluids. Sodium 136 from 133. --Monitor BMP --Off IV fluids  Pathological fracture of right femur (Brittney Fisher) status post orthopedic surgery on 01/01/2021. Status post IM nail with Dr. Harlow Mares on 01/01/2023.  Notified Ortho of her admission. They plan to see her here for staple removal as planned at follow up. --Pain control --PT  Chronic back pain Continue home regimen of analgesics  Results with patient: Hypokalemia  Hypomagnesemia Resolved w/ replacement 1/28, 1/30 Monitor Mg and replace PRN  Abnormal urinalysis No UTI symptoms. Got abx for possible bacterial bronchitis with COPD exacerbation.  Peripheral neuropathy Continue Neurontin.  Anxiety Continue home Xanax.  Metastatic lung cancer (metastasis from lung to other site) Brittney Fisher) Brittney Fisher resumed. Home pain regimen continued. Pt wants  to wean herself down/off opiate therapy. She discussed this with me and her niece at bedside. Agreeable to scheduled 1000 mg Tylenol to help reduce need for PRN oxycodone. Started Movantik for Opioid-induced constipation Appreciate oncology palliative consult for pain management as pt trying to wean herself off opioid and showing signs of withdrawal and/or uncontrolled pain.   Physical Exam: Vitals:   01/17/23 0755 01/17/23 0817 01/17/23 0900 01/17/23 1200  BP: 130/82  133/64 112/74  Pulse: (!) 113     Resp: 18  18 18   Temp: 98 F (36.7 C)     TempSrc:      SpO2: 98% 97% 97% 98%  Weight:      Height:       General exam: awake, underweight, conversational dyspnea noted on exam.   HEENT: atraumatic, clear conjunctiva, anicteric sclera, moist mucus membranes, hearing grossly normal  Respiratory system: Diminished air entry.  Mild rales at bases.  Cardiovascular system: Tachycardic no rubs or gallops.  Gastrointestinal system: Soft noted normal bowel sounds present. Central nervous system: A&O x4. no gross focal neurologic deficits, normal speech Extremities: moves all, no edema, normal tone Psychiatry: normal mood, congruent affect, judgement and insight appear normal  Data Reviewed:  No new labs today   Family Communication: Niece ([facility]PO[facility]) at bedside on rounds 1/27, 1/30  Disposition: Status is: Inpatient Remains inpatient appropriate because: remains significantly dyspneic on minimal exertion with COPD exacerbation.  Needs further improvement.  Anticipate 1-2 more day.   Planned Discharge Destination: Home    Time spent: 36 minutes  Author: Kayleen Memos, DO 01/17/2023 2:16 PM  For on call review [email or web address].

## 2023-01-18 ENCOUNTER — Encounter: Payer: Self-pay | Admitting: Hospice and Palliative Medicine

## 2023-01-18 DIAGNOSIS — J441 Chronic obstructive pulmonary disease with (acute) exacerbation: Secondary | ICD-10-CM | POA: Diagnosis not present

## 2023-01-18 MED ORDER — PSYLLIUM 95 % PO PACK: 1.0000 | PACK | Freq: Every day | ORAL | 0 refills | Status: AC

## 2023-01-18 MED ORDER — NALOXEGOL OXALATE 25 MG PO TABS: 25.0000 mg | ORAL_TABLET | Freq: Every day | ORAL | 0 refills | Status: DC

## 2023-01-18 MED ORDER — BISACODYL 10 MG RE SUPP
10.0000 mg | Freq: Once | RECTAL | Status: AC
  Administered 2023-01-18: 10 mg via RECTAL
  Filled 2023-01-18: qty 1

## 2023-01-18 NOTE — Progress Notes (Signed)
SATURATION QUALIFICATIONS: (This note is used to comply with regulatory documentation for home oxygen)  Patient Saturations on Room Air at Rest = 96%  Patient Saturations on Room Air while Ambulating = 92%  Patient Saturations on 092 Liters of oxygen while Ambulating = 92  Please briefly explain why patient needs home oxygen: pt wants home 02 because when she gets short of breath, it makes her breathing easier

## 2023-01-18 NOTE — TOC Progression Note (Signed)
Transition of Care [facility]) - Progression Note    Patient Details  Name: Brittney Fisher MRN: [id number] Date of Birth: 07/26/1961  Transition of Care [facility]) CM/SW [place], RN Phone Number: 01/18/2023, 2:01 PM  Clinical Narrative:     Met with the patient and her Niece at the bedside, the patient stated that she needs Home Oxygen, her O2 sats does not qualify her, I explained to get Home health with her Oxygen sats as high as they are,, she would need to have an order for it and pay out of pocket, she is followed by Palliative at home She goes to oncology outpatient She stated she wanted a suppository to have a BM prior to going home, I notified the Physician, she will get one then will dc home with her niece to transport, she has a rolling walker and 3 in 1 at home  Expected Discharge Plan: Home/Self Care Barriers to Discharge: Barriers Resolved  Expected Discharge Plan and Services   Discharge Planning Services: CM Consult Post Acute Care Choice: NA Living arrangements for the past 2 months: Single Family Home Expected Discharge Date: 01/18/23                                     Social Determinants of Health (SDOH) Interventions SDOH Screenings   Food Insecurity: No Food Insecurity (01/12/2023)  Housing: Low Risk  (01/12/2023)  Transportation Needs: No Transportation Needs (01/12/2023)  Utilities: Not At Risk (01/12/2023)  Tobacco Use: Medium Risk (01/15/2023)    Readmission Risk Interventions    01/14/2023   10:20 AM  Readmission Risk Prevention Plan  Transportation Screening Complete  PCP or Specialist Appt within 3-5 Days Complete  Social Work Consult for [place] Planning/Counseling Complete  Palliative Care Screening Not Applicable  Medication Review Press photographer) Complete

## 2023-01-18 NOTE — Progress Notes (Signed)
PIV removed site c/d/I Medication picked up for RX 01/12/2023 per RX, confirmed with pt  Gave pt d/c packet she understood information provided, d/c pt with all belongings.

## 2023-01-18 NOTE — Plan of Care (Signed)

## 2023-01-18 NOTE — Discharge Summary (Signed)
Discharge Summary  Brittney Fisher LOV:[id number] DOB: December 09, 1961  PCP: Pcp, No  Admit date: 01/11/2023 Discharge date: 01/18/2023  Time spent: 35 minutes   Recommendations for Outpatient Follow-up:  Follow-up with your medical oncologist within a week. Take your medications as prescribed.  Discharge Diagnoses:  Active Hospital Problems   Diagnosis Date Noted   COPD exacerbation ([place]) 01/11/2023    Priority: 1.   Generalized abdominal pain 01/12/2023    Priority: 3.   Hyponatremia 01/12/2023    Priority: 4.   Palliative care encounter 01/16/2023   Pathological fracture of right femur ([place]) 01/16/2023   Hypokalemia 01/15/2023   Hypomagnesemia 01/13/2023   Anxiety 01/12/2023   Peripheral neuropathy 01/12/2023   Abnormal urinalysis 01/12/2023   Metastatic lung cancer (metastasis from lung to other site) [facility]) 11/02/2020   COPD with acute exacerbation [facility])     Resolved Hospital Problems  No resolved problems to display.    Discharge Condition: Stable.  Diet recommendation: Resume previous diet.  Vitals:   01/18/23 0801 01/18/23 1200  BP:  137/77  Pulse:  (!) 124  Resp:  19  Temp:  98.9 F (37.2 C)  SpO2: 98% 99%    History of present illness:  Brittney Fisher is a 62 y.o. female with medical history significant for COPD, GERD and metastatic lung cancer, who presented to the ED on evening of 01/11/2023 for evaluation of worsening dyspnea over a few days with associated cough productive of greenish sputum, and wheezing.  She is on chronic opioid therapy for chronic pain related to bony metastatic disease.  She was admitted to the hospital for further evaluation and management of COPD with acute exacerbation.    She had an echocardiogram done due to exertional dyspnea, revealed normal LVEF 60 to 65% with grade 1 diastolic dysfunction.  She also had CT scans of the chest which were negative for pulmonary embolism, however showed findings consistent with known metastatic  lung cancer with slight interval increase in size of the pleural and parenchymal nodularity seen previously.  Her O2 saturation was 99% on room air.  She had home O2 evaluation which she passed.  01/18/2023: The patient was seen at her bedside.  Her niece was present in the room.  Complain of constipation.  Was given laxative and produced a bowel movement.  Hospital Course:  Principal Problem:   COPD exacerbation ([place]) Active Problems:   Generalized abdominal pain   Hyponatremia   COPD with acute exacerbation (H[facility])   Metastatic lung cancer (metastasis from lung to other site) [facility])   Anxiety   Peripheral neuropathy   Abnormal urinalysis   Hypomagnesemia   Hypokalemia   Palliative care encounter   Pathological fracture of right femur (H[facility])  COPD exacerbation (H[facility]) Received antibiotics, bronchodilators, and steroids O2 saturation 99% on room air.   Persistent tachycardia, likely related to lung cancer. No pulmonary embolism on CTA chest.   Generalized abdominal pain Likely due to constipation as this resolved after BM's with bowel regimen started on admission.  Abdominal pelvic CT scan showed no acute finding except for distended gallbladder. Negative RUQ U/S, no acute cholecystitis or significant findings.    Resolved hyponatremia   Pathological fracture of right femur ([place]) status post orthopedic surgery on 01/01/2023. Status post IM nail with Dr. Harlow Fisher on 01/01/2023.  Follow-up with orthopedic surgery outpatient.   Chronic back pain On home analgesics. Follow-up with your provider   Resolved, post repletion, hypokalemia   Resolved, post repletion, hypomagnesemia  Abnormal  urinalysis No UTI symptoms. Got abx for possible bacterial bronchitis with COPD exacerbation.   Peripheral neuropathy Continue Neurontin.   Anxiety Continue home Xanax.   Metastatic lung cancer (metastasis from lung to other site) [facility]) [employer] resumed. Pt wants to wean herself down/off  opiate therapy. Started Movantik for Opioid-induced constipation Follow-up with her medical oncologist.   Procedures: 2D echo  Consultations: Palliative care team. Medical oncology.  Discharge Exam: BP 137/77 (BP Location: Left Arm)   Pulse (!) 124   Temp 98.9 F (37.2 C) (Oral)   Resp 19   Ht 5\' 11"  (1.803 m)   Wt 60.7 kg   SpO2 99%   BMI 18.66 kg/m  General: 62 y.o. year-old female well developed well nourished in no acute distress.  Alert and oriented x3. Cardiovascular: Regular rate and rhythm with no rubs or gallops.  No thyromegaly or JVD noted.   Respiratory: Clear to auscultation with no wheezes or rales. Good inspiratory effort. Abdomen: Soft nontender nondistended with normal bowel sounds x4 quadrants. Musculoskeletal: No lower extremity edema. 2/4 pulses in all 4 extremities. Skin: No ulcerative lesions noted or rashes, Psychiatry: Mood is appropriate for condition and setting  Discharge Instructions You were cared for by a hospitalist during your hospital stay. If you have any questions about your discharge medications or the care you received while you were in the hospital after you are discharged, you can call the unit and asked to speak with the hospitalist on call if the hospitalist that took care of you is not available. Once you are discharged, your primary care physician will handle any further medical issues. Please note that NO REFILLS for any discharge medications will be authorized once you are discharged, as it is imperative that you return to your primary care physician (or establish a relationship with a primary care physician if you do not have one) for your aftercare needs so that they can reassess your need for medications and monitor your lab values.   Allergies as of 01/18/2023       Reactions   Carboplatin Shortness Of Breath, Itching, Cough        Medication List     STOP taking these medications    ALPRAZolam 0.5 MG tablet Commonly  known as: XANAX   amoxicillin-clavulanate 875-125 MG tablet Commonly known as: AUGMENTIN   calcium carbonate 500 MG chewable tablet Commonly known as: TUMS - dosed in mg elemental calcium   dexamethasone 4 MG tablet Commonly known as: DECADRON   guaiFENesin 600 MG 12 hr tablet Commonly known as: MUCINEX   magic mouthwash w/lidocaine Soln   MIRALAX PO   ondansetron 8 MG tablet Commonly known as: Zofran   prochlorperazine 10 MG tablet Commonly known as: COMPAZINE       TAKE these medications    acetaminophen 500 MG tablet Commonly known as: TYLENOL Take 1,000 mg by mouth every 8 (eight) hours as needed for moderate pain.   albuterol 108 (90 Base) MCG/ACT inhaler Commonly known as: VENTOLIN HFA Inhale 2 puffs into the lungs every 6 (six) hours as needed for wheezing or shortness of breath.   benzonatate 100 MG capsule Commonly known as: TESSALON Take 1 capsule by mouth three times daily as needed for cough What changed:  how much to take when to take this reasons to take this   fluticasone-salmeterol 250-50 MCG/ACT Aepb Commonly known as: Advair Diskus Inhale 1 puff into the lungs in the morning and at bedtime.   folic acid  1 MG tablet Commonly known as: FOLVITE Take 1 tablet (1 mg total) by mouth daily. Start 5-7 days before Alimta chemotherapy. Continue until 21 days after Alimta completed.   gabapentin 100 MG capsule Commonly known as: Neurontin Take 1 capsule (100 mg total) by mouth 3 (three) times daily.   ipratropium-albuterol 0.5-2.5 (3) MG/3ML Soln Commonly known as: DUONEB Take 3 mLs by nebulization 3 (three) times daily.   Lumakras 120 MG tablet Generic drug: sotorasib TAKE 8 TABLETS BY MOUTH DAILY   naloxegol oxalate 25 MG Tabs tablet Commonly known as: MOVANTIK Take 1 tablet (25 mg total) by mouth daily. Start taking on: January 19, 2023   naloxone 4 MG/0.1ML Liqd nasal spray kit Commonly known as: NARCAN For suspected overdose of  narcotics.   oxyCODONE 5 MG immediate release tablet Commonly known as: Oxy IR/ROXICODONE Take 1-2 tablets (5-10 mg total) by mouth every 4 (four) hours as needed for moderate pain or severe pain.   oxyCODONE 20 mg 12 hr tablet Commonly known as: OXYCONTIN Take 1 tablet (20 mg total) by mouth every 12 (twelve) hours.   protein supplement shake Liqd Commonly known as: PREMIER PROTEIN Take 11 oz by mouth daily.   psyllium 95 % Pack Commonly known as: HYDROCIL/METAMUCIL Take 1 packet by mouth daily. Start taking on: January 19, 2023       Allergies  Allergen Reactions   Carboplatin Shortness Of Breath, Itching and Cough      The results of significant diagnostics from this hospitalization (including imaging, microbiology, ancillary and laboratory) are listed below for reference.    Significant Diagnostic Studies: CT Angio Chest Pulmonary Embolism (PE) W or WO Contrast  Result Date: 01/17/2023 CLINICAL DATA:  COPD, history of metastatic lung cancer, worsening dyspnea and cough EXAM: CT ANGIOGRAPHY CHEST WITH CONTRAST TECHNIQUE: Multidetector CT imaging of the chest was performed using the standard protocol during bolus administration of intravenous contrast. Multiplanar CT image reconstructions and MIPs were obtained to evaluate the vascular anatomy. RADIATION DOSE REDUCTION: This exam was performed according to the departmental dose-optimization program which includes automated exposure control, adjustment of the mA and/or kV according to patient size and/or use of iterative reconstruction technique. CONTRAST:  68mL OMNIPAQUE IOHEXOL 350 MG/ML SOLN COMPARISON:  01/11/2023 FINDINGS: Cardiovascular: This is a technically adequate evaluation of the pulmonary vasculature. No filling defects or pulmonary emboli. The heart is unremarkable without pericardial effusion. No evidence of thoracic aortic aneurysm or dissection. Stable atherosclerosis of the aorta and coronary vasculature. 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 Mediastinum/Nodes: Loss of normal fat planes throughout the mediastinum may reflect post therapeutic change or sequela of metastatic disease. Discrete adenopathy or mass is difficult to measure, but the appearance is stable since the previous evaluation. Thyroid, trachea, and esophagus are grossly unremarkable. Lungs/Pleura: Chronic areas of consolidation within the bilateral lower lobes are again noted and unchanged. Extensive pleural nodularity again noted bilaterally, right greater than left. Chronic scarring and bronchovascular thickening within the right hilar region unchanged. Index nodules measured previously are as follows: Left upper lobe, image 50/6, 8 mm.  Previously 7 mm. Right lower lobe, image 103/6, 16 mm.  Previously 14 mm. Right middle lobe, image 103/6, 13 mm.  Previously 12 mm. Left upper lobe, image 99/6, 13 mm.  Previously 12 mm. Trace bilateral pleural effusions are again noted.  No pneumothorax. Upper Abdomen: No acute abnormality. Musculoskeletal: Stable sclerotic metastatic  disease within the bilateral scapula, sternum, and thoracic spine. There are no acute displaced fractures. Reconstructed images demonstrate no additional findings. Review of the MIP images confirms the above findings. IMPRESSION: 1. No evidence of pulmonary embolus. 2. Findings consistent with known metastatic lung cancer, with slight interval increase in size of the pleural and parenchymal nodularity seen previously. 3. Stable diffuse sclerotic bony metastases. 4.  Aortic Atherosclerosis (ICD10-I70.0). Electronically Signed   By: Randa Ngo M.D.   On: 01/17/2023 16:37   ECHOCARDIOGRAM COMPLETE  Result Date: 01/16/2023    ECHOCARDIOGRAM REPORT   Patient Name:   LEYA PAIGE Date of Exam: 01/16/2023 Medical Rec #:  [id number]        Height:       71.0 in  Accession #:    [id number]       Weight:       133.8 lb Date of Birth:  1961/01/07        BSA:          1.778 m Patient Age:    73 years         BP:           129/71 mmHg Patient Gender: F                HR:           110 bpm. Exam Location:  [facility] Procedure: 2D Echo, Color Doppler and Cardiac Doppler Indications:    Acute respiratory distress R06.03  History:        Patient has no prior history of Echocardiogram examinations.                 COPD.  Sonographer:    Sherrie Sport Referring Phys: [id number] [facility] A GRIFFITH Diagnosing      Kate Sable MD Phys:  Sonographer Comments: Image acquisition challenging due to patient body habitus. IMPRESSIONS  1. Left ventricular ejection fraction, by estimation, is 60 to 65%. The left ventricle has normal function. The left ventricle has no regional wall motion abnormalities. Left ventricular diastolic parameters are consistent with Grade I diastolic dysfunction (impaired relaxation).  2. Right ventricular systolic function is normal. The right ventricular size is normal.  3. The mitral valve is normal in structure. No evidence of mitral valve regurgitation.  4. The aortic valve is tricuspid. Aortic valve regurgitation is not visualized. FINDINGS  Left Ventricle: Left ventricular ejection fraction, by estimation, is 60 to 65%. The left ventricle has normal function. The left ventricle has no regional wall motion abnormalities. The left ventricular internal cavity size was normal in size. There is  no left ventricular hypertrophy. Left ventricular diastolic parameters are consistent with Grade I diastolic dysfunction (impaired relaxation). Right Ventricle: The right ventricular size is normal. No increase in right ventricular wall thickness. Right ventricular systolic function is normal. Left Atrium: Left atrial size was normal in size. Right Atrium: Right atrial size was normal in size. Pericardium: There is no evidence of pericardial effusion. Mitral Valve: The mitral valve  is normal in structure. No evidence of mitral valve regurgitation. Tricuspid Valve: The tricuspid valve is normal in structure. Tricuspid valve regurgitation is not demonstrated. Aortic Valve: The aortic valve is tricuspid. Aortic valve regurgitation is not visualized. Aortic valve mean gradient measures 3.0 mmHg. Aortic valve peak gradient measures 4.8 mmHg. Aortic valve area, by VTI measures 2.30 cm. Pulmonic Valve: The pulmonic valve was normal in structure. Pulmonic valve regurgitation is not visualized. Aorta: The aortic root is normal  in size and structure. Venous: The inferior vena cava was not well visualized. IAS/Shunts: No atrial level shunt detected by color flow Doppler.  LEFT VENTRICLE PLAX 2D LVIDd:         3.20 cm   Diastology LVIDs:         1.90 cm   LV e' medial:    7.07 cm/s LV PW:         0.90 cm   LV E/e' medial:  9.1 LV IVS:        1.10 cm   LV e' lateral:   9.25 cm/s LVOT diam:     1.90 cm   LV E/e' lateral: 7.0 LV SV:         37 LV SV Index:   21 LVOT Area:     2.84 cm  RIGHT VENTRICLE RV Basal diam:  3.60 cm RV Mid diam:    2.70 cm RV S prime:     18.40 cm/s TAPSE (M-mode): 2.1 cm LEFT ATRIUM           Index       RIGHT ATRIUM          Index LA diam:      1.50 cm 0.84 cm/m  RA Area:     9.50 cm LA Vol (A2C): 6.9 ml  3.88 ml/m  RA Volume:   17.30 ml 9.73 ml/m LA Vol (A4C): 10.0 ml 5.63 ml/m  AORTIC VALVE AV Area (Vmax):    2.35 cm AV Area (Vmean):   2.05 cm AV Area (VTI):     2.30 cm AV Vmax:           109.00 cm/s AV Vmean:          76.900 cm/s AV VTI:            0.160 m AV Peak Grad:      4.8 mmHg AV Mean Grad:      3.0 mmHg LVOT Vmax:         90.20 cm/s LVOT Vmean:        55.600 cm/s LVOT VTI:          0.130 m LVOT/AV VTI ratio: 0.81  AORTA Ao Root diam: 3.20 cm MITRAL VALVE               TRICUSPID VALVE MV Area (PHT): 6.90 cm    TR Peak grad:   31.6 mmHg MV Decel Time: 110 msec    TR Vmax:        281.00 cm/s MV E velocity: 64.50 cm/s MV A velocity: 96.90 cm/s  SHUNTS MV E/A  ratio:  0.67        Systemic VTI:  0.13 m                            Systemic Diam: 1.90 cm Kate Sable MD Electronically signed by Kate Sable MD Signature Date/Time: 01/16/2023/4:29:24 PM    Final    DG Chest Port 1 View  Result Date: 01/15/2023 CLINICAL DATA:  Shortness of breath.  COPD.  Metastatic lung cancer. EXAM: PORTABLE CHEST 1 VIEW COMPARISON:  01/11/2023 FINDINGS: Heart size remains normal. Chronic volume loss and scarring in the right upper chest remains stable. Chronic bilateral pleural blunting remains stable. Chronic volume loss/infiltrate in both lower lobes. Compared to the study of 4 days ago, findings appear quite similar. No new process is identified. IMPRESSION: No significant change since the study of 4  days ago. Chronic volume loss and scarring in the right upper chest. 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 Electronically Signed   By: Nelson Chimes M.D.   On: 01/15/2023 08:07   US Abdomen Limited RUQ (LIVER/GB)  Result Date: 01/12/2023 CLINICAL DATA:  62 year old female with history of abdominal pain. EXAM: ULTRASOUND ABDOMEN LIMITED RIGHT UPPER QUADRANT COMPARISON:  No priors. FINDINGS: Gallbladder: Gallbladder is moderately distended. No evidence of gallstones or biliary sludge. Gallbladder wall thickness is normal. No pericholecystic fluid. Per report from the sonographer, there was no sonographic Murphy's sign on examination. Common bile duct: Diameter: 3.4 mm. Liver: No focal lesion identified. Within normal limits in parenchymal echogenicity. Portal vein is patent on color Doppler imaging with normal direction of blood flow towards the liver. Other: Incidental imaging of the right kidney demonstrates an anechoic lesion with increased through transmission in the interpolar region, compatible with a large parapelvic cyst, estimated to measure approximately 4.3 x 4.0 x 2.8 cm (no imaging follow-up recommended). IMPRESSION: 1.  No acute findings. Specifically, no gallstones and no evidence of acute cholecystitis. Electronically Signed   By: Vinnie Langton M.D.   On: 01/12/2023 05:42   CT Angio Chest PE W and/or Wo Contrast  Result Date: 01/11/2023 CLINICAL DATA:  Pulmonary embolism (PE) suspected, high prob Patient with history of metastatic lung cancer. EXAM: CT ANGIOGRAPHY CHEST WITH CONTRAST TECHNIQUE: Multidetector CT imaging of the chest was performed using the standard protocol during bolus administration of intravenous contrast. Multiplanar CT image reconstructions and MIPs were obtained to evaluate the vascular anatomy. RADIATION DOSE REDUCTION: This exam was performed according to the departmental dose-optimization program which includes automated exposure control, adjustment of the mA and/or kV according to patient size and/or use of iterative reconstruction technique. CONTRAST:  41mL OMNIPAQUE IOHEXOL 350 MG/ML SOLN COMPARISON:  Radiograph earlier today. Staging chest CT 12/24/2022 FINDINGS: Cardiovascular: There are no filling defects within the pulmonary arteries to suggest pulmonary embolus. Aortic atherosclerosis and tortuosity. No acute aortic findings. Stable heart size. Trace pericardial effusion. Bilateral anterior chest wall collaterals, however no evidence of SVC occlusion. Mediastinum/Nodes: Ill-defined soft tissue planes within the mediastinum again seen without significant interval change. Ill-defined soft tissue density seen anterior to the trachea, at the level of the carina and adjacent to the right mainstem bronchus. 14 mm central left hilar lymph node versus pulmonary nodule. Patulous esophagus. Lungs/Pleura: Again seen pleural thickening the right lung apex, near circumferential throughout the right hemithorax with areas of pleural nodularity. Areas of peripheral pleural based nodularity in the right hemithorax are again seen. Representative right lower lobe pleural base nodule measures 15 x 12 mm series  7, image 97, unchanged by my retrospective measurement. Peri diaphragmatic nodule in the right lung base measures 14 mm series 7, image 111, also unchanged. Pleural based right middle lobe nodule measures 11 mm series 7, image 109, unchanged. Interstitial nodularity throughout the right lower lobe with bronchial thickening is stable. Ill-defined soft tissue density in the central right perihilar lung is unchanged. Small right pleural effusion. Consolidation in the left lower lobe, most prominent centrally, with heterogeneous parenchyma, stable from prior. Subpleural left upper lobe nodule measures 12 mm series 7, image 102, previously 10 mm. Subpleural left upper lobe nodule measuring 7 mm series 7, image 58, previously 6 mm. Similar left pleural thickening with small left pleural effusion. No definite acute airspace disease. Upper Abdomen: Assessed on abdominopelvic CT earlier today. Musculoskeletal: Multifocal sclerotic osseous lesions typical of  metastatic disease. Exaggerated upper thoracic kyphosis. Unchanged upper thoracic mild compression deformities. No new osseous findings. Review of the MIP images confirms the above findings. IMPRESSION: 1. No pulmonary embolus. 2. History of known lung cancer, with stable appearance of the chest from earlier this month. Recommend continued oncologic follow-up per protocol. 3. Ill-defined soft tissue planes within the mediastinum and central right lung are unchanged from prior exam. 4. Stable osseous metastatic disease. Aortic Atherosclerosis (ICD10-I70.0). Electronically Signed   By: Keith Rake M.D.   On: 01/11/2023 21:21   CT ABDOMEN PELVIS WO CONTRAST  Result Date: 01/11/2023 CLINICAL DATA:  Left lower quadrant abdominal pain. 2 days. History of non-small-cell lung cancer. EXAM: CT ABDOMEN AND PELVIS WITHOUT CONTRAST TECHNIQUE: Multidetector CT imaging of the abdomen and pelvis was performed following the standard protocol without IV contrast. RADIATION DOSE  REDUCTION: This exam was performed according to the departmental dose-optimization program which includes automated exposure control, adjustment of the mA and/or kV according to patient size and/or use of iterative reconstruction technique. COMPARISON:  CT 12/24/2022. older CT sidewall. X-ray 01/11/2023 earlier FINDINGS: Lower chest: Once again at the lung bases are pleural effusions, pleural thickening and masslike areas with reticulonodular changes, similar to the prior examination. Please correlate with prior chest CT and clinical history of lung cancer. Pericardial effusion is also seen with some enlarged pre cardiophrenic lymph nodes Hepatobiliary: On this non IV contrast exam, the liver is grossly preserved. The gallbladder is distended but unchanged from prior. Pancreas: Grossly the pancreas has preserved parenchyma. Spleen: Spleen is nonenlarged. Adrenals/Urinary Tract: Stable mild thickening of the adrenal glands. Bilateral Bosniak 1 renal cysts are stable. No specific imaging follow-up of the cysts in the kidneys. No renal or ureteral stones clearly seen. Contracted urinary bladder. Stomach/Bowel: Stomach is nondilated. There is some luminal air and fluid. There is some high attenuation seen along the course of the colon with scattered stool. The colon is nondilated. The cecum is in the low right hemipelvis. Very redundant course to loops of colon. Normal appendix identified posterior cecum in the right hemipelvis as seen on coronal series 4, image 39. No clear adjacent inflammatory stranding along the colon. Small bowel is nondilated. Vascular/Lymphatic: Diffuse vascular calcifications along the aorta and branch vessels. Grossly preserved caliber IVC. Reproductive: Uterus and bilateral adnexa are unremarkable. There are some calcifications along the uterus, possible calcified fibroids. Other: Anasarca.  No clear ascites. Musculoskeletal: Scattered sclerotic bone metastases are identified. Please correlate  with prior workup. Interval placement of dynamic right hip screw with acute surgical changes. Lytic lesion along the right femoral neck is again seen. Stranding. IMPRESSION: Study limited without the advantage of contrast and lack of intra-abdominal fat. No bowel obstruction, free air or free fluid. Diffuse scattered colonic stool. Normal appendix. Anasarca. Interval placement of a dynamic right hip screw transfixing the lucent lesion in the femoral neck. There is sclerotic bone metastases elsewhere as per prior history. Distended gallbladder but unchanged from previous. If there is concern of acute gallbladder pathology ultrasound may be useful. Extensive abnormality along the lung bases consistent with known history of lung cancer. Please correlate with recent workup. Electronically Signed   By: Jill Side M.D.   On: 01/11/2023 19:32   DG Chest Portable 1 View  Result Date: 01/11/2023 CLINICAL DATA:  Shortness of breath. Cancer. Evaluate for effusion or edema. EXAM: PORTABLE CHEST 1 VIEW COMPARISON:  Radiograph 12/21/2022. CT 12/24/2022 FINDINGS: Chronic volume loss in the right hemithorax. Chronic right apical opacity, ill-defined  right basilar opacities and right basilar effusion. Left infrahilar opacity and small left effusion are similar to prior CT. No pulmonary edema. Stable heart size and mediastinal contours. No pneumothorax. Scattered sclerotic lesions are better appreciated on prior CT. IMPRESSION: 1. Stable radiographic appearance of the chest. Stable right lung volume loss with apical and basilar opacity and right pleural effusion from earlier this month. 2. Stable left basilar opacity and small left pleural effusion. 3. No pulmonary edema. Electronically Signed   By: Keith Rake M.D.   On: 01/11/2023 19:19   DG Abdomen 1 View  Result Date: 01/11/2023 CLINICAL DATA:  Constipation. Patient reports abdominal pain. EXAM: ABDOMEN - 1 VIEW COMPARISON:  CT 12/24/2022 FINDINGS: Added views of  the abdomen obtained. No bowel dilatation to suggest obstruction. Moderate volume of stool in the colon, mild gaseous distension of splenic flexure. No radiopaque calculi or abnormal soft tissue calcifications. Surgical hardware in the right proximal femur is partially visualized. Stable sclerotic densities within the left proximal femur. Sclerotic lesions within the bony pelvis are only faintly visualized by radiograph. Known sclerotic metastatic disease. IMPRESSION: Nonobstructive bowel gas pattern. Moderate volume of colonic stool. Electronically Signed   By: Keith Rake M.D.   On: 01/11/2023 18:10   DG FEMUR, MIN 2 VIEWS RIGHT  Result Date: 01/01/2023 CLINICAL DATA:  Right intramedullary nail EXAM: RIGHT FEMUR 2 VIEWS COMPARISON:  None Available. FINDINGS: Fluoroscopic images were obtained intraoperatively and submitted for post operative interpretation. Right intramedullary nail placement, 10 images were obtained with 1 minute and 27 seconds of fluoroscopy time and 8.23 mGy. Please see the performing provider's procedural report for further detail. IMPRESSION: Intraoperative fluoroscopic guidance for right intramedullary nail placement. Electronically Signed   By: Yetta Glassman M.D.   On: 01/01/2023 14:57   DG C-Arm 1-60 Min-No Report  Result Date: 01/01/2023 Fluoroscopy was utilized by the requesting physician.  No radiographic interpretation.   DG C-Arm 1-60 Min-No Report  Result Date: 01/01/2023 Fluoroscopy was utilized by the requesting physician.  No radiographic interpretation.   CT CHEST ABDOMEN PELVIS W CONTRAST  Result Date: 12/24/2022 CLINICAL DATA:  Non-small cell lung cancer. Assess treatment response. Initial diagnosis November 2021. Chemotherapy radiation therapy. Immunotherapy completed. * Tracking Code: BO * EXAM: CT CHEST, ABDOMEN, AND PELVIS WITH CONTRAST TECHNIQUE: Multidetector CT imaging of the chest, abdomen and pelvis was performed following the standard protocol  during bolus administration of intravenous contrast. RADIATION DOSE REDUCTION: This exam was performed according to the departmental dose-optimization program which includes automated exposure control, adjustment of the mA and/or kV according to patient size and/or use of iterative reconstruction technique. CONTRAST:  78mL OMNIPAQUE IOHEXOL 300 MG/ML  SOLN COMPARISON:  03/20/2022 FINDINGS: CT CHEST FINDINGS Cardiovascular: No significant vascular findings. Normal heart size. No pericardial effusion. Mediastinum/Nodes: Ill-defined tissue planes within the mediastinum consistent with tumor infiltrate. No change from comparison exam. No axillary supraclavicular adenopathy Lungs/Pleura: Peripheral nodules in the RIGHT lower lobe and along the RIGHT lower lobe pleural surface. For example lateral nodule in the RIGHT lower lobe along the pleural surface measures 16 mm (image 86/3 compared to 12 mm. Nodule over the RIGHT hemidiaphragm measuring 13 mm (image 104/3) compares to 11 mm. Nodule in the RIGHT middle lobe pleural surface measures 11 mm (97/3 compared to 11 mm. There is bronchiectasis and peribronchial thickening in the RIGHT lobe similar prior. Several new peripheral nodules in the LEFT lung. For example LEFT upper lobe nodule measuring 10 mm (image 91/3) compares to 6 mm.  Consolidation in the LEFT lower lobe is similar prior. RIGHT upper lobe nodule measuring 6 mm compares to 5 (image 46/3 Musculoskeletal: Stable sclerotic lesions in the thoracic spine CT ABDOMEN AND PELVIS FINDINGS Hepatobiliary: No focal hepatic lesion. No biliary ductal dilatation. Gallbladder is normal. Common bile duct is normal. Pancreas: Pancreas is normal. No ductal dilatation. No pancreatic inflammation. Spleen: Normal spleen Adrenals/urinary tract: Adrenal glands and kidneys are normal. The ureters and bladder normal. Stomach/Bowel: Stomach, small bowel, appendix, and cecum are normal. The colon and rectosigmoid colon are normal.  Vascular/Lymphatic: Abdominal aorta is normal caliber with atherosclerotic calcification. There is no retroperitoneal or periportal lymphadenopathy. No pelvic lymphadenopathy. Reproductive: Uterus and adnexa unremarkable. Other: No peritoneal metastasis Musculoskeletal: Multiple round sclerotic lesions in the proximal femurs, iliac bones, sacrum and lower thoracic spine are unchanged. Lytic lesion within the RIGHT femoral neck measures 2.7 cm unchanged IMPRESSION: CHEST IMPRESSION: 1. Bilateral peripheral pleural nodules are mildly increased in size. 2. Bilateral small effusions. Stable consolidation at the LEFT lung base 3. Stable ill-defined tissue in the mediastinum suggesting treated tumor infiltrate. PELVIS IMPRESSION: 1. No evidence of metastatic disease in the abdomen pelvis. 2. Stable sclerotic metastasis in the spine and pelvis. 3. Stable lytic lesion in the RIGHT femoral neck may be at risk for pathologic fracture. 4.  Aortic Atherosclerosis (ICD10-I70.0). Electronically Signed   By: Suzy Bouchard M.D.   On: 12/24/2022 14:06   [place] CHEST (PLEURAL EFFUSION)  Result Date: 12/21/2022 CLINICAL DATA:  Pleural effusion EXAM: CHEST ULTRASOUND COMPARISON:  None Available. FINDINGS: Focused sonographic exam of the bilateral chest was performed for fluid assessment. Images demonstrate no significant pleural effusion on either the right or left side. No thoracentesis performed. IMPRESSION: No significant pleural effusion appreciated by ultrasound on either side. No thoracentesis performed. Electronically Signed   By: Albin Felling M.D.   On: 12/21/2022 12:32   DG Chest 2 View  Result Date: 12/21/2022 CLINICAL DATA:  History of lung cancer with several week history of shortness of breath EXAM: CHEST - 2 VIEW COMPARISON:  CT chest dated 10/12/2022 chest radiograph dated 12/30/2020 FINDINGS: Slightly low lung volumes. Persistent right apical opacity and asymmetric patchy right lung opacities. Multifocal right  lung nodules are better evaluated on prior chest CT. New peripheral left upper lung nodular opacity. Small to moderate right pleural effusion. Trace left pleural effusion. The heart size and mediastinal contours are within normal limits. The visualized skeletal structures are unremarkable. IMPRESSION: 1. Persistent right apical opacity and asymmetric patchy right lung opacities. Multifocal right lung nodules are better evaluated on prior chest CT. 2. New peripheral left upper lung nodular opacity may represent a focus of infection or metastasis. 3. Small to moderate right pleural effusion. Trace left pleural effusion. Electronically Signed   By: Darrin Nipper M.D.   On: 12/21/2022 09:03    Microbiology: Recent Results (from the past 240 hour(s))  Urine Culture (for pregnant, neutropenic or urologic patients or patients with an indwelling urinary catheter)     Status: None   Collection Time: 01/11/23  5:38 PM   Specimen: Urine, Clean Catch  Result Value Ref Range Status   Specimen Description   Final    URINE, CLEAN CATCH Performed at [facility], [address] [place], [place] [address]    Special Requests   Final    NONE Performed at [facility], [address], [place], [place] [address]    Culture   Final    NO GROWTH Performed  at [facility], [place] [address], [place], [place] [address]    Report Status 01/13/2023 FINAL  Final     Labs: Basic Metabolic Panel: Recent Labs  Lab 01/12/23 0500 01/13/23 0513 01/15/23 0231 01/16/23 0628 01/17/23 0617  NA 133* 136 134* 136 135  K 4.0 3.9 3.1* 3.5 4.1  CL 100 104 101 105 100  CO2 22 23 28 22 27   GLUCOSE 122* 107* 93 87 93  BUN 11 8 9 12 11   CREATININE 0.65 0.51 0.54 0.51 0.54  CALCIUM 8.2* 8.7* 8.7* 8.5* 8.9  MG  --  1.6* 1.6* 1.8 1.7  PHOS  --   --   --  3.0 3.1   Liver Function Tests: Recent Labs  Lab 01/11/23 1738  AST 17  ALT 10  ALKPHOS 58  BILITOT 0.8  PROT 7.6  ALBUMIN 3.3*   Recent  Labs  Lab 01/11/23 1738  LIPASE 25   No results for input(s): "AMMONIA" in the last 168 hours. CBC: Recent Labs  Lab 01/11/23 1738 01/12/23 0500 01/17/23 0617  WBC 7.4 6.0 7.1  HGB 12.1 10.6* 11.5*  HCT 38.9 33.7* 36.5  MCV 88.4 87.1 87.1  PLT 245 228 255   Cardiac Enzymes: No results for input(s): "CKTOTAL", "CKMB", "CKMBINDEX", "TROPONINI" in the last 168 hours. BNP: BNP (last 3 results) Recent Labs    01/16/23 0628  BNP 27.4    ProBNP (last 3 results) No results for input(s): "PROBNP" in the last 8760 hours.  CBG: No results for input(s): "GLUCAP" in the last 168 hours.     Signed:  Kayleen Memos, MD [facility] 01/18/2023, 3:18 PM

## 2023-01-21 ENCOUNTER — Emergency Department: Payer: Medicaid Other

## 2023-01-21 ENCOUNTER — Emergency Department
Admission: EM | Admit: 2023-01-21 | Discharge: 2023-01-21 | Disposition: A | Discharge status: Home / Self Care | Payer: Medicaid Other | Attending: Student in an Organized Health Care Education/Training Program | Admitting: Student in an Organized Health Care Education/Training Program

## 2023-01-21 ENCOUNTER — Emergency Department
Admission: EM | Admit: 2023-01-21 | Discharge: 2023-01-22 | Disposition: A | Discharge status: Home / Self Care | Payer: Medicaid Other | Source: Home / Self Care | Attending: Emergency Medicine | Admitting: Emergency Medicine

## 2023-01-21 ENCOUNTER — Encounter: Payer: Self-pay | Admitting: Oncology

## 2023-01-21 ENCOUNTER — Other Ambulatory Visit: Payer: Self-pay

## 2023-01-21 DIAGNOSIS — R0602 Shortness of breath: Secondary | ICD-10-CM | POA: Diagnosis present

## 2023-01-21 DIAGNOSIS — R0609 Other forms of dyspnea: Secondary | ICD-10-CM

## 2023-01-21 DIAGNOSIS — R000 Tachycardia, unspecified: Secondary | ICD-10-CM | POA: Insufficient documentation

## 2023-01-21 DIAGNOSIS — Z1152 Encounter for screening for COVID-19: Secondary | ICD-10-CM | POA: Diagnosis not present

## 2023-01-21 DIAGNOSIS — J449 Chronic obstructive pulmonary disease, unspecified: Secondary | ICD-10-CM | POA: Insufficient documentation

## 2023-01-21 DIAGNOSIS — Z85118 Personal history of other malignant neoplasm of bronchus and lung: Secondary | ICD-10-CM | POA: Diagnosis not present

## 2023-01-21 DIAGNOSIS — J441 Chronic obstructive pulmonary disease with (acute) exacerbation: Secondary | ICD-10-CM | POA: Diagnosis not present

## 2023-01-21 DIAGNOSIS — C3490 Malignant neoplasm of unspecified part of unspecified bronchus or lung: Secondary | ICD-10-CM | POA: Insufficient documentation

## 2023-01-21 DIAGNOSIS — R0600 Dyspnea, unspecified: Secondary | ICD-10-CM

## 2023-01-21 LAB — COMPREHENSIVE METABOLIC PANEL
ALT: 10 U/L (ref 0–44)
AST: 16 U/L (ref 15–41)
Albumin: 3.1 g/dL — ABNORMAL LOW (ref 3.5–5.0)
Alkaline Phosphatase: 67 U/L (ref 38–126)
Anion gap: 13 (ref 5–15)
BUN: 21 mg/dL (ref 8–23)
CO2: 24 mmol/L (ref 22–32)
Calcium: 8.8 mg/dL — ABNORMAL LOW (ref 8.9–10.3)
Chloride: 93 mmol/L — ABNORMAL LOW (ref 98–111)
Creatinine, Ser: 0.69 mg/dL (ref 0.44–1.00)
GFR, Estimated: >60 mL/min (ref >60)
Glucose, Bld: 122 mg/dL — ABNORMAL HIGH (ref 70–99)
Potassium: 4.2 mmol/L (ref 3.5–5.1)
Sodium: 130 mmol/L — ABNORMAL LOW (ref 135–145)
Total Bilirubin: 0.7 mg/dL (ref 0.3–1.2)
Total Protein: 6.9 g/dL (ref 6.5–8.1)

## 2023-01-21 LAB — CBC WITH DIFFERENTIAL/PLATELET
Abs Immature Granulocytes: 0.07 10*3/uL (ref 0.00–0.07)
Basophils Absolute: 0 10*3/uL (ref 0.0–0.1)
Basophils Relative: 0 %
Eosinophils Absolute: 0 10*3/uL (ref 0.0–0.5)
Eosinophils Relative: 0 %
HCT: 37.3 % (ref 36.0–46.0)
Hemoglobin: 12 g/dL (ref 12.0–15.0)
Immature Granulocytes: 1 %
Lymphocytes Relative: 3 %
Lymphs Abs: 0.3 10*3/uL — ABNORMAL LOW (ref 0.7–4.0)
MCH: 27.6 pg (ref 26.0–34.0)
MCHC: 32.2 g/dL (ref 30.0–36.0)
MCV: 85.9 fL (ref 80.0–100.0)
Monocytes Absolute: 1.1 10*3/uL — ABNORMAL HIGH (ref 0.1–1.0)
Monocytes Relative: 9 %
Neutro Abs: 10.3 10*3/uL — ABNORMAL HIGH (ref 1.7–7.7)
Neutrophils Relative %: 87 %
Platelets: 219 10*3/uL (ref 150–400)
RBC: 4.34 MIL/uL (ref 3.87–5.11)
RDW: 14.9 % (ref 11.5–15.5)
WBC: 11.8 10*3/uL — ABNORMAL HIGH (ref 4.0–10.5)
nRBC: 0 % (ref 0.0–0.2)

## 2023-01-21 LAB — PROCALCITONIN: Procalcitonin: 0.23 ng/mL

## 2023-01-21 LAB — LACTIC ACID, PLASMA
Lactic Acid, Venous: 2 mmol/L (ref 0.5–1.9)
Lactic Acid, Venous: 2 mmol/L (ref 0.5–1.9)

## 2023-01-21 LAB — RESP PANEL BY RT-PCR (RSV, FLU A&B, COVID)  RVPGX2
Influenza A by PCR: NEGATIVE
Influenza B by PCR: NEGATIVE
Resp Syncytial Virus by PCR: NEGATIVE
SARS Coronavirus 2 by RT PCR: NEGATIVE

## 2023-01-21 LAB — CBC
HCT: 37.2 % (ref 36.0–46.0)
Hemoglobin: 11.8 g/dL — ABNORMAL LOW (ref 12.0–15.0)
MCH: 27.3 pg (ref 26.0–34.0)
MCHC: 31.7 g/dL (ref 30.0–36.0)
MCV: 85.9 fL (ref 80.0–100.0)
Platelets: 230 10*3/uL (ref 150–400)
RBC: 4.33 MIL/uL (ref 3.87–5.11)
RDW: 14.9 % (ref 11.5–15.5)
WBC: 9.5 10*3/uL (ref 4.0–10.5)
nRBC: 0 % (ref 0.0–0.2)

## 2023-01-21 LAB — T4, FREE: Free T4: 1.86 ng/dL — ABNORMAL HIGH (ref 0.61–1.12)

## 2023-01-21 LAB — TROPONIN I (HIGH SENSITIVITY)
Troponin I (High Sensitivity): 8 ng/L (ref <18)
Troponin I (High Sensitivity): 7 ng/L (ref <18)
Troponin I (High Sensitivity): 10 ng/L (ref <18)

## 2023-01-21 LAB — BASIC METABOLIC PANEL
Anion gap: 14 (ref 5–15)
BUN: 22 mg/dL (ref 8–23)
CO2: 22 mmol/L (ref 22–32)
Calcium: 8.6 mg/dL — ABNORMAL LOW (ref 8.9–10.3)
Chloride: 94 mmol/L — ABNORMAL LOW (ref 98–111)
Creatinine, Ser: 0.69 mg/dL (ref 0.44–1.00)
GFR, Estimated: >60 mL/min (ref >60)
Glucose, Bld: 154 mg/dL — ABNORMAL HIGH (ref 70–99)
Potassium: 4.5 mmol/L (ref 3.5–5.1)
Sodium: 130 mmol/L — ABNORMAL LOW (ref 135–145)

## 2023-01-21 LAB — TSH: TSH: 0.306 u[IU]/mL — ABNORMAL LOW (ref 0.350–4.500)

## 2023-01-21 MED ORDER — ALBUTEROL SULFATE HFA 108 (90 BASE) MCG/ACT IN AERS: 2.0000 | INHALATION_SPRAY | Freq: Four times a day (QID) | RESPIRATORY_TRACT | Status: DC | PRN

## 2023-01-21 MED ORDER — ACETAMINOPHEN 500 MG PO TABS
1000.0000 mg | ORAL_TABLET | Freq: Three times a day (TID) | ORAL | Status: DC | PRN
  Administered 2023-01-22: 1000 mg via ORAL
  Filled 2023-01-21: qty 2

## 2023-01-21 MED ORDER — PSYLLIUM 95 % PO PACK
1.0000 | PACK | Freq: Every day | ORAL | Status: DC
  Administered 2023-01-22: 1 via ORAL
  Filled 2023-01-21: qty 1

## 2023-01-21 MED ORDER — ALBUTEROL SULFATE HFA 108 (90 BASE) MCG/ACT IN AERS
2.0000 | INHALATION_SPRAY | RESPIRATORY_TRACT | Status: DC | PRN
  Filled 2023-01-21: qty 6.7

## 2023-01-21 MED ORDER — NALOXEGOL OXALATE 25 MG PO TABS
25.0000 mg | ORAL_TABLET | Freq: Every day | ORAL | Status: DC
  Administered 2023-01-22: 25 mg via ORAL
  Filled 2023-01-21: qty 1

## 2023-01-21 MED ORDER — SOTORASIB 120 MG PO TABS: 960.0000 mg | ORAL_TABLET | Freq: Every day | ORAL | Status: DC

## 2023-01-21 MED ORDER — LACTATED RINGERS IV BOLUS
1000.0000 mL | Freq: Once | INTRAVENOUS | Status: AC
  Administered 2023-01-21: 1000 mL via INTRAVENOUS

## 2023-01-21 MED ORDER — BENZONATATE 100 MG PO CAPS: 100.0000 mg | ORAL_CAPSULE | Freq: Three times a day (TID) | ORAL | Status: DC | PRN

## 2023-01-21 MED ORDER — ALBUTEROL SULFATE (2.5 MG/3ML) 0.083% IN NEBU: 2.5000 mg | INHALATION_SOLUTION | Freq: Four times a day (QID) | RESPIRATORY_TRACT | Status: DC | PRN

## 2023-01-21 MED ORDER — MOMETASONE FURO-FORMOTEROL FUM 200-5 MCG/ACT IN AERO
2.0000 | INHALATION_SPRAY | Freq: Two times a day (BID) | RESPIRATORY_TRACT | Status: DC
  Administered 2023-01-22: 2 via RESPIRATORY_TRACT
  Filled 2023-01-21: qty 8.8

## 2023-01-21 MED ORDER — IPRATROPIUM-ALBUTEROL 0.5-2.5 (3) MG/3ML IN SOLN
3.0000 mL | Freq: Once | RESPIRATORY_TRACT | Status: AC
  Administered 2023-01-21: 3 mL via RESPIRATORY_TRACT
  Filled 2023-01-21: qty 3

## 2023-01-21 MED ORDER — METHYLPREDNISOLONE SODIUM SUCC 125 MG IJ SOLR
125.0000 mg | Freq: Once | INTRAMUSCULAR | Status: AC
  Administered 2023-01-21: 125 mg via INTRAVENOUS
  Filled 2023-01-21: qty 2

## 2023-01-21 MED ORDER — SODIUM CHLORIDE 0.9 % IV BOLUS
1000.0000 mL | Freq: Once | INTRAVENOUS | Status: AC
  Administered 2023-01-21: 1000 mL via INTRAVENOUS

## 2023-01-21 MED ORDER — IPRATROPIUM-ALBUTEROL 0.5-2.5 (3) MG/3ML IN SOLN
3.0000 mL | Freq: Three times a day (TID) | RESPIRATORY_TRACT | Status: DC
  Administered 2023-01-22: 3 mL via RESPIRATORY_TRACT
  Filled 2023-01-21: qty 3

## 2023-01-21 MED ORDER — OXYCODONE HCL ER 10 MG PO T12A
20.0000 mg | EXTENDED_RELEASE_TABLET | Freq: Two times a day (BID) | ORAL | Status: DC
  Administered 2023-01-21 – 2023-01-22 (×2): 20 mg via ORAL
  Filled 2023-01-21 (×2): qty 2

## 2023-01-21 MED ORDER — PREDNISONE 20 MG PO TABS: 40.0000 mg | ORAL_TABLET | Freq: Every day | ORAL | 0 refills | Status: AC

## 2023-01-21 MED ORDER — OXYCODONE HCL 5 MG PO TABS: 5.0000 mg | ORAL_TABLET | ORAL | Status: DC | PRN

## 2023-01-21 MED ORDER — GABAPENTIN 100 MG PO CAPS
100.0000 mg | ORAL_CAPSULE | Freq: Three times a day (TID) | ORAL | Status: DC
  Filled 2023-01-21: qty 1

## 2023-01-21 MED ORDER — MORPHINE SULFATE (PF) 4 MG/ML IV SOLN
4.0000 mg | Freq: Once | INTRAVENOUS | Status: AC
  Administered 2023-01-21: 4 mg via INTRAVENOUS
  Filled 2023-01-21: qty 1

## 2023-01-21 NOTE — ED Provider Notes (Addendum)
[facility] Provider Note    Event Date/Time   First MD Initiated Contact with Patient 01/21/23 1658     (approximate)   History   Shortness of Breath   HPI  Brittney Fisher is a 62 y.o. female past medical history of COPD, stage IV lung cancer  who presents with shortness of breath.  Patient was seen in the emergency department earlier today this morning.  She woke up today with increasing dyspnea.  In the emergency department earlier she received DuoNeb and IV steroids laboratory work and chest x-ray are overall reassuring.  She was persistently tachycardic but this was similar to how she has been during recent hospitalizations and she was feeling improved and was discharged.  Said she was feeling better when she left the ED but then when she was walking to the house she got short of breath again and this scared her family members.  She thus returns to emergency department.  Says that she really only feels better when she has oxygen and think she needs it.  Patient was recently admitted to the hospital for dyspnea was treated for pneumonia and COPD exacerbation.  She was evaluated for home O2 needs but did not meet criteria.  Patient denies cough fevers chills chest pain lower extremity swelling.  When I am evaluating her currently she denies any dyspnea.   During patient's recent hospitalization she had a CTA that was negative for pulmonary embolism, echo showed normal EF with grade 1 diastolic dysfunction.  Past Medical History:  Diagnosis Date   COPD (chronic obstructive pulmonary disease) (HCC)    GERD (gastroesophageal reflux disease)    Metastatic lung cancer (metastasis from lung to other site) ([place]) 11/02/2020    Patient Active Problem List   Diagnosis Date Noted   Palliative care encounter 01/16/2023   Pathological fracture of right femur ([place]) 01/16/2023   Hypokalemia 01/15/2023   Hypomagnesemia 01/13/2023   Anxiety 01/12/2023   Peripheral  neuropathy 01/12/2023   Generalized abdominal pain 01/12/2023   Hyponatremia 01/12/2023   Abnormal urinalysis 01/12/2023   COPD exacerbation ([place]) 01/11/2023   Impending pathologic fracture 01/01/2023   Insomnia 07/19/2022   Dysuria 06/22/2022   Bone metastasis 07/28/2021   Anemia 01/09/2021   Odynophagia 01/09/2021   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   Encounter for antineoplastic chemotherapy 11/25/2020   Tachycardia 11/25/2020   Neoplasm related pain 11/25/2020   Primary malignant neoplasm of lung metastatic to other site [facility]) 11/04/2020   Metastatic lung cancer (metastasis from lung to other site) ([place]) 11/02/2020   Weight loss 10/28/2020   Goals of care, counseling/discussion 10/28/2020   Malnutrition of moderate degree 10/22/2020   Pleural effusion 10/20/2020   Malignant pleural effusion    COPD with acute exacerbation (HCC)    Dizziness    Metastatic malignant neoplasm (HCC)    Shortness of breath    S/P thoracentesis    Lung mass      Physical Exam  Triage Vital Signs: ED Triage Vitals [01/21/23 1630]  Enc Vitals Group     BP 110/67     Pulse Rate (!) 134     Resp (!) 30     Temp (!) 97.4 F (36.3 C)     Temp Source Oral     SpO2 96 %     Weight      Height      Head Circumference      Peak Flow      Pain Score 7  Pain Loc      Pain Edu?      Excl. in [place]?     Most recent vital signs: Vitals:   01/21/23 1800 01/21/23 1930  BP: 110/69 119/70  Pulse: (!) 121 (!) 122  Resp: 18   Temp:    SpO2: 95% 94%     General: Awake, no distress. thin CV:  Good peripheral perfusion.  No peripheral edema or leg asymmetry Resp:  Normal effort.  No increased work of breathing, lung sounds are somewhat diminished but there is no focal wheezing Abd:  No distention.  Neuro:             Awake, Alert, Oriented x 3  Other:     ED Results / Procedures / Treatments  Labs (all labs ordered are listed, but only abnormal results are displayed) Labs Reviewed  BASIC  METABOLIC PANEL - Abnormal; Notable for the following components:      Result Value   Sodium 130 (*)    Chloride 94 (*)    Glucose, Bld 154 (*)    Calcium 8.6 (*)    All other components within normal limits  CBC - Abnormal; Notable for the following components:   Hemoglobin 11.8 (*)    All other components within normal limits  LACTIC ACID, PLASMA - Abnormal; Notable for the following components:   Lactic Acid, Venous 2.0 (*)    All other components within normal limits  LACTIC ACID, PLASMA  TSH  T4, FREE  PROCALCITONIN  TROPONIN I (HIGH SENSITIVITY)     EKG  EKG reviewed interpreted by myself shows sinus tachycardia normal axis normal intervals no acute ischemic changes   RADIOLOGY Chest x-ray reviewed interpreted myself shows chronic changes in the right lung and pleural effusion right greater than left   PROCEDURES:  Critical Care performed: No  .1-3 Lead EKG Interpretation  Performed by: Rada Hay, MD Authorized by: Rada Hay, MD     Interpretation: abnormal     ECG rate assessment: tachycardic     Rhythm: sinus tachycardia     Ectopy: none     Conduction: normal     The patient is on the cardiac monitor to evaluate for evidence of arrhythmia and/or significant heart rate changes.   MEDICATIONS ORDERED IN ED: Medications  ipratropium-albuterol (DUONEB) 0.5-2.5 (3) MG/3ML nebulizer solution 3 mL (has no administration in time range)  ipratropium-albuterol (DUONEB) 0.5-2.5 (3) MG/3ML nebulizer solution 3 mL (has no administration in time range)  lactated ringers bolus 1,000 mL (1,000 mLs Intravenous New Bag/Given 01/21/23 1830)  morphine (PF) 4 MG/ML injection 4 mg (4 mg Intravenous Given 01/21/23 1828)     IMPRESSION / MDM / ASSESSMENT AND PLAN / ED COURSE  I reviewed the triage vital signs and the nursing notes.                              Patient's presentation is most consistent with acute presentation with potential threat to life  or bodily function.  Differential diagnosis includes, but is not limited to, COPD exacerbation, pneumothorax, pneumonia, pulmonary embolism, worsening of disease, pleural effusion  The patient is a 62 year old female with known metastatic lung cancer presents with dyspnea.  Seen in the ED earlier today for dyspnea that started this morning was ultimately discharged due to reassuring workup and minimal symptoms.  She was noted to be tachycardic during the ED visit.  Recently was admitted for  COPD exacerbation and pneumonia treatment.  She apparently was feeling better when she left the hospital but when she was walking to the house got short of breath again this scared her family members.    When I evaluated her she says that she is here because she needs oxygen only feels better when she is on the oxygen.  She is currently breathing comfortably not in respiratory distress.  There is no wheezing or sounds somewhat diminished.  No signs of DVT.  She denies dyspnea currently denies chest pain cough fevers chills lower extremity swelling.  Patient is saturating 99% on room air she is tachycardic to the 120s rest of her vitals are reassuring.  Reviewed recent ED visit as well as recent hospitalization and outpatient visits it seems that she is persistently tachycardic with heart rate typically in the 120s to 130s.  During recent admission it was thought that this is likely related to her malignancy.   X-ray and basic labs including CBC and BMP.  Chest x-ray does not have any findings that are acute.  She has pleural effusions right greater than left as well as chronic changes from the malignancy on the right.  Mildly hyponatremic sodium 130 no leukocytosis hemoglobin at baseline.  Lactate was drawn from triage and this is just above the upper limit of normal at 2.  Troponins x 2 were done earlier today given patient is not having any new chest pain or dyspnea at the moment do not feel need to repeat.  PE given  patient has known malignancy, however this tachycardia is not seen now she is not dyspneic currently she is not hypoxic.  Had CTA just 4 days ago and prior to that 6 days before that.  Will defer CTA at this time.  Will give fluid bolus and morphine.  Will plan to utilize shared decision making with the patient regarding her wishes about admission versus discharge given that is her second ED visit today.    I had the patient ambulate and she was able to go for several feet before desaturating to 89% and becoming quite dyspneic with pursed lip breathing.  Given this I do not think that she will be appropriate for discharge.  She did bump back up to normal sats when sitting down.  She is not wheezing will trial DuoNeb.  Will add on TSH and free T4 further evaluation of her tachycardia.  Patient tells me her breathing is really not worse than when she was here the other day at which point she had a negative CTA so I do not feel strongly about repeating this.  Will check bilateral lower extremity Dopplers instead.  TSH is slightly low free T4 also mildly elevated.  Lactate still 2 on repeat.  Attempted walk test and patient did desat to 89%.  Became quite dyspneic with pursed lip breathing.  I discussed with the hospitalist as I do think patient will need home oxygen.  Dr. Francine Graven evaluated the patient and felt that patient will be okay to be discharged with oxygen in the morning.  She does not feel that she needs admission for anything else at this time.  I do think that if I was able to get the patient home oxygen I would comfortable discharging her.  Unfortunately at this hour we are not able to arrange this via social work.  Will place social work consult and keep patient ED to the morning.  Patient is comfortable with this plan   FINAL  CLINICAL IMPRESSION(S) / ED DIAGNOSES   Final diagnoses:  Dyspnea on exertion  Primary malignant neoplasm of lung metastatic to other site, unspecified laterality ([place])   Tachycardia     Rx / DC Orders   ED Discharge Orders     None        Note:  This document was prepared using Dragon voice recognition software and may include unintentional dictation errors.   Rada Hay, MD 01/21/23 2016    Rada Hay, MD 01/21/23 [phone]

## 2023-01-21 NOTE — ED Notes (Signed)
Ambulated patient 15 ft in the hallway using walker. Patient Oxygen dropped to 89% on RA, HR 131,  Patient became tired and purse lipped breathing. Asked to find somewhere to sit immediately.

## 2023-01-21 NOTE — Progress Notes (Signed)
Called by emergency room physician to see patient for possible admission for evaluation of exertional shortness of breath.  Patient has had 2 ED visits in the last 8 hours. She was admitted to the hospital from 01/12/23 to 01/21/23 for acute COPD exacerbation.  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 Prior to her discharge on 01/18/23, she was assessed for home oxygen need and post ambulatory pulse oximetry was 92%. She returns to the ER for evaluation of persistent exertional shortness of breath and was seen earlier in the day on 01/21/23 and discharged home.  She had a CT angiogram which was done 01/17/23 with no evidence of pulmonary embolism but shows slight interval increase in size of the pleural and parenchymal nodularity consistent with her metastatic lung cancer. She has tachycardia at rest which appears to be stable for her most likely related to her lung cancer.  Patient states that she needs home oxygen as this has helped her symptoms. She was reassessed in the ER and room air pulse oximetry dropped to 89% with ambulation. Physical exam Patient is seen and examined at bedside.  Chronically ill-appearing but appears comfortable and in no distress.  Able to speak in complete sentences Head and neck exam -pale conjunctiva, anicteric sclera Lungs -bilateral air entry, no wheezes or crackles Cardiovascular S1,S2, tachycardic Abdomen- Bs+, soft, nontender, nondistended Extremities -no pedal edema Discussed with ER physician, Dr Starleen Blue, no indication for admission at this time.  TOC consult can be placed in a.m. to assist in obtaining home oxygen for this patient and then she can be discharged home. Dr Starleen Blue is in agreement.

## 2023-01-21 NOTE — ED Triage Notes (Signed)
Pt BIBA from home. Pt states that after leaving, she went out to eat and became [facility].  Pt has hx stage 4 lung CA.

## 2023-01-21 NOTE — ED Provider Notes (Signed)
[facility] Provider Note    Event Date/Time   First MD Initiated Contact with Patient 01/21/23 [phone]     (approximate)   History   Shortness of Breath   HPI  Brittney Fisher is a 62 y.o. female history of stage IV lung cancer as well as COPD presents to the ER for shortness of breath.  Just recently admitted for dyspnea multiple CTAs ruled out PE.  Did not qualify for oxygen and was sent home without oxygen.  States she was otherwise doing well and then this morning woke up with increasing dyspnea.  EMS found patient tachycardic encouraged to come to the ER.  She was tachycardic throughout hospitalization felt likely to be secondary to her stage IV lung cancer.     Physical Exam   Triage Vital Signs: ED Triage Vitals  Enc Vitals Group     BP 01/21/23 0927 111/70     Pulse Rate 01/21/23 0925 (!) 134     Resp 01/21/23 0925 (!) 36     Temp 01/21/23 0925 98 F (36.7 C)     Temp Source 01/21/23 0925 Oral     SpO2 01/21/23 0924 100 %     Weight 01/21/23 0928 130 lb (59 kg)     Height 01/21/23 0928 5\' 11"  (1.803 m)     Head Circumference --      Peak Flow --      Pain Score 01/21/23 0925 0     Pain Loc --      Pain Edu? --      Excl. in [place]? --     Most recent vital signs: Vitals:   01/21/23 0925 01/21/23 0927  BP:  111/70  Pulse: (!) 134   Resp: (!) 36   Temp: 98 F (36.7 C)   SpO2: 100%      Constitutional: Alert  Eyes: Conjunctivae are normal.  Head: Atraumatic. Nose: No congestion/rhinnorhea. Mouth/Throat: Mucous membranes are moist.   Neck: Painless ROM.  Cardiovascular:   Good peripheral circulation.  Tachycardic but well-perfused. Respiratory: Tachypnea, diffuse expiratory wheeze.  Speaking in complete sentences. Gastrointestinal: Soft and nontender.  Musculoskeletal:  no deformity Neurologic:  MAE spontaneously. No gross focal neurologic deficits are appreciated.  Skin:  Skin is warm, dry and intact. No rash  noted. Psychiatric: Mood and affect are normal. Speech and behavior are normal.    ED Results / Procedures / Treatments   Labs (all labs ordered are listed, but only abnormal results are displayed) Labs Reviewed  COMPREHENSIVE METABOLIC PANEL - Abnormal; Notable for the following components:      Result Value   Sodium 130 (*)    Chloride 93 (*)    Glucose, Bld 122 (*)    Calcium 8.8 (*)    Albumin 3.1 (*)    All other components within normal limits  CBC WITH DIFFERENTIAL/PLATELET - Abnormal; Notable for the following components:   WBC 11.8 (*)    Neutro Abs 10.3 (*)    Lymphs Abs 0.3 (*)    Monocytes Absolute 1.1 (*)    All other components within normal limits  RESP PANEL BY RT-PCR (RSV, FLU A&B, COVID)  RVPGX2  TROPONIN I (HIGH SENSITIVITY)  TROPONIN I (HIGH SENSITIVITY)     EKG  ED ECG REPORT I, Merlyn Lot, the attending physician, personally viewed and interpreted this ECG.   Date: 01/21/2023  EKG Time: 9:27  Rate: 130  Rhythm: sinus  Axis: normal  Intervals: normal qt  ST&T Change: no stemi, no depressions    RADIOLOGY Please see ED Course for my review and interpretation.  I personally reviewed all radiographic images ordered to evaluate for the above acute complaints and reviewed radiology reports and findings.  These findings were personally discussed with the patient.  Please see medical record for radiology report.    PROCEDURES:  Critical Care performed: No  Procedures   MEDICATIONS ORDERED IN ED: Medications  ipratropium-albuterol (DUONEB) 0.5-2.5 (3) MG/3ML nebulizer solution 3 mL (3 mLs Nebulization Given 01/21/23 1009)  sodium chloride 0.9 % bolus 1,000 mL (0 mLs Intravenous Stopped 01/21/23 1157)  methylPREDNISolone sodium succinate (SOLU-MEDROL) 125 mg/2 mL injection 125 mg (125 mg Intravenous Given 01/21/23 1009)     IMPRESSION / MDM / ASSESSMENT AND PLAN / ED COURSE  I reviewed the triage vital signs and the nursing notes.                               Differential diagnosis includes, but is not limited to, Asthma, copd, CHF, pna, ptx, malignancy, Pe, anemia  Patient presenting to the ER for evaluation of symptoms as described above.  Based on symptoms, risk factors and considered above differential, this presenting complaint could reflect a potentially life-threatening illness therefore the patient will be placed on continuous pulse oximetry and telemetry for monitoring.  Laboratory evaluation will be sent to evaluate for the above complaints.      Clinical Course as of 01/21/23 1525  Mon Jan 21, 2023  1051 6 x-ray imaging on my review and interpretation without pneumothorax.  Blood work appears roughly at baseline. [PR]  1152 Patient reassessed.  She felt significantly improved after nebulizer treatment and Solu-Medrol.  We discussed option for observation admission to the hospital given her dyspnea.  Patient feels like she is at baseline.  She is not requiring O2.  Her tachycardia is persistent.  I do not feel that repeat CT imaging clinically indicated as she has had multiple images over the past week and a half all of been negative for PE.  On further questioning patient states that she did not take her inhalers this morning her nebulizers.  I do suspect some mild underlying COPD.  Patient will prefer outpatient management given her stage IV lung cancer and wishes to stay home is much as possible I think that is reasonable.  She has follow-up with her physician tomorrow. [PR]    Clinical Course User Index [PR] Merlyn Lot, MD    FINAL CLINICAL IMPRESSION(S) / ED DIAGNOSES   Final diagnoses:  COPD exacerbation ([place])  Dyspnea, unspecified type     Rx / DC Orders   ED Discharge Orders          Ordered    predniSONE (DELTASONE) 20 MG tablet  Daily        01/21/23 1154             Note:  This document was prepared using Dragon voice recognition software and may include unintentional dictation  errors.    Merlyn Lot, MD 01/21/23 [phone]

## 2023-01-21 NOTE — ED Triage Notes (Signed)
Arrives from home via [facility].  Patient c/o sob with movement and walking.  Seen earlier today for same.  Has history of stage 4 lung cancer.  Wears 2l/ [place] -- bu EMS states RA sat 98%.  1 albuterol by EMS, patient states symtpoms improve.  P;  133.

## 2023-01-21 NOTE — ED Notes (Signed)
Patient given saltine crackers at this time.

## 2023-01-21 NOTE — ED Notes (Signed)
Patient refused douneb at this time. Lung assessment B/L lung sounds clear and diminished. Patient educated on neb use.

## 2023-01-21 NOTE — ED Triage Notes (Signed)
Pt to ED via EMS from home CC of SOB on exertion since last discharge from hospital on 2/2 for similar complaints. Pt has stage 4 lung cancer and is not on )2 at baseline. Pt is currently on 2 L satting 100%. Pt is A&O x4 with no signs of acute distress.

## 2023-01-22 ENCOUNTER — Telehealth: Payer: Self-pay | Admitting: Oncology

## 2023-01-22 ENCOUNTER — Inpatient Hospital Stay: Payer: Medicaid Other | Admitting: Oncology

## 2023-01-22 LAB — URINALYSIS, ROUTINE W REFLEX MICROSCOPIC
Bilirubin Urine: NEGATIVE
Glucose, UA: NEGATIVE mg/dL
Hgb urine dipstick: NEGATIVE
Ketones, ur: 20 mg/dL — AB
Nitrite: NEGATIVE
Protein, ur: 30 mg/dL — AB
Specific Gravity, Urine: 1.029 (ref 1.005–1.030)
pH: 5 (ref 5.0–8.0)

## 2023-01-22 NOTE — TOC Initial Note (Signed)
Transition of Care [facility]) - Initial/Assessment Note    Patient Details  Name: Brittney Fisher MRN: [id number] Date of Birth: [birth year]  Transition of Care [facility]) CM/SW Contact:    Shelbie Hutching, RN Phone Number: 01/22/2023, 10:23 AM  Clinical Narrative:                  Patient came into the emergency room with shortness of breath, history of COPD and stage 4 lung cancer.  Patient is not currently on home oxygen.  Patient qualifies with oxygen saturations 87% on room air while eating breakfast this morning.  Referral for oxygen given to [facility] with [facility]y Romans.  Oxygen will be delivered to the patient's room before she leaves to go home.     Expected Discharge Plan: Home/Self Care     Patient Goals and CMS Choice Patient states their goals for this hospitalization and ongoing recovery are:: Patient wants oxygen for home          Expected Discharge Plan and Services   Discharge Planning Services: CM Consult   Living arrangements for the past 2 months: Single Family Home                 DME Arranged: Oxygen DME Agency: [employer] Date DME Agency Contacted: 01/22/23 Time DME Agency Contacted: [phone] Representative spoke with at DME Agency: [place]: NA          Prior Living Arrangements/Services Living arrangements for the past 2 months: [place]   Patient language and need for interpreter reviewed:: Yes Do you feel safe going back to the place where you live?: Yes      Need for Family Participation in Patient Care: Yes (Comment) Care giver support system in place?: Yes (comment) Current home services: DME Criminal Activity/Legal Involvement Pertinent to Current Situation/Hospitalization: No - Comment as needed  Activities of Daily Living      Permission Sought/Granted Permission sought to share information with : Family Supports, Case Manager    Share Information with NAME: Reita Chard     Permission granted to share info w  Relationship: niece  Permission granted to share info w Contact Information: [phone]  Emotional Assessment Appearance:: Appears stated age Attitude/Demeanor/Rapport: Engaged Affect (typically observed): Accepting Orientation: : Oriented to Self, Oriented to Place, Oriented to  Time, Oriented to Situation Alcohol / Substance Use: Not Applicable Psych Involvement: No (comment)  Admission diagnosis:  Shortness of Breath; EMS Patient Active Problem List   Diagnosis Date Noted   Palliative care encounter 01/16/2023   Pathological fracture of right femur ([employer]) 01/16/2023   Hypokalemia 01/15/2023   Hypomagnesemia 01/13/2023   Anxiety 01/12/2023   Peripheral neuropathy 01/12/2023   Generalized abdominal pain 01/12/2023   Hyponatremia 01/12/2023   Abnormal urinalysis 01/12/2023   COPD exacerbation ([place]) 01/11/2023   Impending pathologic fracture 01/01/2023   Insomnia 07/19/2022   Dysuria 06/22/2022   Bone metastasis 07/28/2021   Anemia 01/09/2021   Odynophagia 01/09/2021   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   Encounter for antineoplastic chemotherapy 11/25/2020   Tachycardia 11/25/2020   Neoplasm related pain 11/25/2020   Primary malignant neoplasm of lung metastatic to other site [facility]) 11/04/2020   Metastatic lung cancer (metastasis from lung to other site) ([place]) 11/02/2020   Weight loss 10/28/2020   Goals of care, counseling/discussion 10/28/2020   Malnutrition of moderate degree 10/22/2020   Pleural effusion 10/20/2020   Malignant pleural effusion    COPD with acute exacerbation (HCC)    Dizziness  Metastatic malignant neoplasm ([place])    Shortness of breath    S/P thoracentesis    Lung mass    PCP:  Pcp, No Pharmacy:   [facility] [address]), [place] [address] [place] ([place]) [place] [address] Phone: [phone] Fax: [phone]  [place] , [place] - [address] [place] [address] Phone: [phone] Fax: [phone]     Social Determinants of Health (SDOH) Social History: [place]: No Food Insecurity (01/12/2023)  Housing: Low Risk  (01/12/2023)  Transportation Needs: No Transportation Needs (01/12/2023)  Utilities: Not At Risk (01/12/2023)  Tobacco Use: Medium Risk (01/21/2023)   SDOH Interventions:     Readmission Risk Interventions    01/14/2023   10:20 AM  Readmission Risk Prevention Plan  Transportation Screening Complete  PCP or Specialist Appt within 3-5 Days Complete  Social Work Consult for [place] Planning/Counseling Complete  Palliative Care Screening Not Applicable  Medication Review Press photographer) Complete

## 2023-01-22 NOTE — ED Provider Notes (Addendum)
Care assumed of patient from outgoing provider.  See their note for initial history, exam and plan.  Clinical Course as of 01/22/23 0706  Tue Jan 22, 2023  0[facility] consult - lung cancer with history of persistent tachycardia.  Consulted hospitalist for admission yesterday for ambulatory hypoxia and increased work of breathing, ultimately decision was made to keep the patient in the ER until social work could help with home O2.  [SM]    Clinical Course User Index [SM] Nathaniel Man, MD    Patient 87% at rest.  Placed on 2 L nasal cannula.  Patient will need oxygen at home.   Nathaniel Man, MD 01/22/23 5929    Nathaniel Man, MD 01/22/23 1017

## 2023-01-22 NOTE — ED Notes (Signed)
Patient in room, awake, alert and oriented. Patient denies needs at this time. Patient in NAD.

## 2023-01-22 NOTE — ED Notes (Signed)
Provider okay with pts vitals prior to discharge.

## 2023-01-22 NOTE — TOC Transition Note (Signed)
Transition of Care [facility]) - CM/SW Discharge Note   Patient Details  Name: Brittney Fisher MRN: [id number] Date of Birth: 03/28/1961  Transition [facility]) CM/SW Contact:  Shelbie Hutching, RN Phone Number: 01/22/2023, 1:35 PM   Clinical Narrative:    Thayer Jew confirmed that he delivered the patient's oxygen to the bedside and that delivery for the home set up will be later today.     Final next level of care: Home/Self Care Barriers to Discharge: Barriers Resolved   Patient Goals and CMS Choice      Discharge Placement                         Discharge Plan and Services Additional resources added to the After Visit Summary for     Discharge Planning Services: CM Consult            DME Arranged: Oxygen DME Agency: [place] Date DME Agency Contacted: 01/22/23 Time DME Agency Contacted: [phone] Representative spoke with at DME Agency: Thayer Jew [facility] Arranged: NA          Social Determinants of Health ([place]) Interventions SDOH Screenings   Food Insecurity: No Food Insecurity (01/12/2023)  Housing: Low Risk  (01/12/2023)  Transportation Needs: No Transportation Needs (01/12/2023)  Utilities: Not At Risk (01/12/2023)  Tobacco Use: Medium Risk (01/21/2023)     Readmission Risk Interventions    01/14/2023   10:20 AM  Readmission Risk Prevention Plan  Transportation Screening Complete  PCP or Specialist Appt within 3-5 Days Complete  Social Work Consult for [place] Planning/Counseling Complete  Palliative Care Screening Not Applicable  Medication Review Press photographer) Complete

## 2023-01-22 NOTE — ED Provider Notes (Signed)
-----------------------------------------   4:25 AM on 01/22/2023 -----------------------------------------   Blood pressure 126/71, pulse (!) 124, temperature 97.8 F (36.6 C), temperature source Oral, resp. rate (!) 32, SpO2 93 %.  The patient is calm and cooperative at this time.  There have been no acute events since the last update.  Awaiting disposition plan from case management/social work.    Kaylin Marcon, Delice Bison, DO 01/22/23 [phone]

## 2023-01-22 NOTE — ED Notes (Addendum)
Patient oxygen saturation on room air at rest while eating 87%. Oxygen saturation on room air at 2L [place] is 93%.

## 2023-01-22 NOTE — TOC Transition Note (Signed)
Transition of Care [facility]) - CM/SW Discharge Note   Patient Details  Name: Brittney Fisher MRN: [id number] Date of Birth: 12/10/1961  Transition of Care [facility]) CM/SW Contact:  Shelbie Hutching, RN Phone Number: 01/22/2023, 11:28 AM   Clinical Narrative:    Thayer Jew with [employer] [facility] is on his way to deliver the patient's oxygen.  Once oxygen is delivered patient can be discharged from the ED.  Niece is at the bedside and will provide transportation home.     Final next level of care: Home/Self Care Barriers to Discharge: Barriers Resolved   Patient Goals and CMS Choice      Discharge Placement                         Discharge Plan and Services Additional resources added to the After Visit Summary for     Discharge Planning Services: CM Consult            DME Arranged: Oxygen DME Agency: [place] Date DME Agency Contacted: 01/22/23 Time DME Agency Contacted: [phone] Representative spoke with at DME Agency: Thayer Jew [facility] Arranged: NA          Social Determinants of Health ([place]) Interventions SDOH Screenings   Food Insecurity: No Food Insecurity (01/12/2023)  Housing: Low Risk  (01/12/2023)  Transportation Needs: No Transportation Needs (01/12/2023)  Utilities: Not At Risk (01/12/2023)  Tobacco Use: Medium Risk (01/21/2023)     Readmission Risk Interventions    01/14/2023   10:20 AM  Readmission Risk Prevention Plan  Transportation Screening Complete  PCP or Specialist Appt within 3-5 Days Complete  Social Work Consult for [place] Planning/Counseling Complete  Palliative Care Screening Not Applicable  Medication Review Press photographer) Complete

## 2023-01-22 NOTE — Assessment & Plan Note (Deleted)
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   cT4 N3 M1-  KRASG12C mutation, currently on second line treatment with Lumakras [Patient has high TMB, TPS less than 1%.  Immunotherapy in combination with chemotherapy will be other options in the future if lumakras is not effective] CT femur showed enlargement of right femur lesion, no fracture.  Labs reviewed and discussed with patient. Continue Lumakras.  Obtain CT chest abdomen pelvis in Jan 2024  

## 2023-01-22 NOTE — Telephone Encounter (Signed)
pt family memeber called to notify us that pt has been admitted into hospital and will not be able to make appt today. Appt has been r/s

## 2023-01-22 NOTE — ED Provider Notes (Signed)
-----------------------------------------   1:39 PM on 01/22/2023 ----------------------------------------- Patient has been seen and evaluated by the social worker.  They have arranged for the patient to get home oxygen which has been delivered to the house.  They believe the patient is safe for discharge home.  This appears to be the only medical hold up for disposition/discharge now that the patient has oxygen available at the home patient will be discharged shortly.   Harvest Dark, MD 01/22/23 1339

## 2023-01-23 ENCOUNTER — Telehealth: Payer: Self-pay | Admitting: Oncology

## 2023-01-23 ENCOUNTER — Telehealth: Payer: Self-pay

## 2023-01-23 NOTE — Telephone Encounter (Signed)
Pt missed appt earlier this week due to being in hospital. She has been discharged.   Please schedule and inform pt of appt Lab/MD - next week  Cancel appt on 2/22

## 2023-01-23 NOTE — Telephone Encounter (Signed)
pt niece called, pt has just been discharged from hospital. She is currently scheduled for FU appt on 2/22. Niece would like to know if pt can be scheduled for Hospital FU sooner. Please advise

## 2023-01-23 NOTE — Telephone Encounter (Signed)
I forwarded a phone note to you on this pt, earlier today. Please see other ph note regarding scheduling.

## 2023-01-28 ENCOUNTER — Encounter: Payer: Self-pay | Admitting: Oncology

## 2023-01-28 ENCOUNTER — Inpatient Hospital Stay: Payer: Medicaid Other

## 2023-01-28 ENCOUNTER — Inpatient Hospital Stay: Payer: Medicaid Other | Attending: Radiation Oncology | Admitting: Oncology

## 2023-01-28 ENCOUNTER — Institutional Professional Consult (permissible substitution): Payer: Medicaid Other | Admitting: Pulmonary Disease

## 2023-01-28 VITALS — BP 121/73 | HR 120 | Temp 97.6°F | Resp 14 | Wt 120.1 lb

## 2023-01-28 DIAGNOSIS — R634 Abnormal weight loss: Secondary | ICD-10-CM

## 2023-01-28 DIAGNOSIS — E0590 Thyrotoxicosis, unspecified without thyrotoxic crisis or storm: Secondary | ICD-10-CM | POA: Insufficient documentation

## 2023-01-28 DIAGNOSIS — Z9981 Dependence on supplemental oxygen: Secondary | ICD-10-CM | POA: Diagnosis not present

## 2023-01-28 DIAGNOSIS — R000 Tachycardia, unspecified: Secondary | ICD-10-CM

## 2023-01-28 DIAGNOSIS — G893 Neoplasm related pain (acute) (chronic): Secondary | ICD-10-CM | POA: Diagnosis not present

## 2023-01-28 DIAGNOSIS — C3490 Malignant neoplasm of unspecified part of unspecified bronchus or lung: Secondary | ICD-10-CM

## 2023-01-28 DIAGNOSIS — F419 Anxiety disorder, unspecified: Secondary | ICD-10-CM | POA: Insufficient documentation

## 2023-01-28 DIAGNOSIS — C3411 Malignant neoplasm of upper lobe, right bronchus or lung: Secondary | ICD-10-CM | POA: Diagnosis present

## 2023-01-28 DIAGNOSIS — Z923 Personal history of irradiation: Secondary | ICD-10-CM | POA: Diagnosis not present

## 2023-01-28 DIAGNOSIS — C782 Secondary malignant neoplasm of pleura: Secondary | ICD-10-CM | POA: Diagnosis not present

## 2023-01-28 DIAGNOSIS — Z5111 Encounter for antineoplastic chemotherapy: Secondary | ICD-10-CM

## 2023-01-28 DIAGNOSIS — R531 Weakness: Secondary | ICD-10-CM | POA: Diagnosis not present

## 2023-01-28 DIAGNOSIS — J910 Malignant pleural effusion: Secondary | ICD-10-CM | POA: Diagnosis not present

## 2023-01-28 DIAGNOSIS — Z7952 Long term (current) use of systemic steroids: Secondary | ICD-10-CM | POA: Insufficient documentation

## 2023-01-28 DIAGNOSIS — J9611 Chronic respiratory failure with hypoxia: Secondary | ICD-10-CM

## 2023-01-28 DIAGNOSIS — Z87891 Personal history of nicotine dependence: Secondary | ICD-10-CM | POA: Diagnosis not present

## 2023-01-28 DIAGNOSIS — Z79891 Long term (current) use of opiate analgesic: Secondary | ICD-10-CM | POA: Insufficient documentation

## 2023-01-28 DIAGNOSIS — C7951 Secondary malignant neoplasm of bone: Secondary | ICD-10-CM

## 2023-01-28 DIAGNOSIS — J449 Chronic obstructive pulmonary disease, unspecified: Secondary | ICD-10-CM | POA: Diagnosis not present

## 2023-01-28 DIAGNOSIS — Z79899 Other long term (current) drug therapy: Secondary | ICD-10-CM | POA: Insufficient documentation

## 2023-01-28 DIAGNOSIS — E860 Dehydration: Secondary | ICD-10-CM | POA: Diagnosis not present

## 2023-01-28 LAB — CBC WITH DIFFERENTIAL/PLATELET
Abs Immature Granulocytes: 0.05 10*3/uL (ref 0.00–0.07)
Basophils Absolute: 0 10*3/uL (ref 0.0–0.1)
Basophils Relative: 0 %
Eosinophils Absolute: 0 10*3/uL (ref 0.0–0.5)
Eosinophils Relative: 0 %
HCT: 36.1 % (ref 36.0–46.0)
Hemoglobin: 11.7 g/dL — ABNORMAL LOW (ref 12.0–15.0)
Immature Granulocytes: 1 %
Lymphocytes Relative: 4 %
Lymphs Abs: 0.4 10*3/uL — ABNORMAL LOW (ref 0.7–4.0)
MCH: 27.6 pg (ref 26.0–34.0)
MCHC: 32.4 g/dL (ref 30.0–36.0)
MCV: 85.1 fL (ref 80.0–100.0)
Monocytes Absolute: 0.6 10*3/uL (ref 0.1–1.0)
Monocytes Relative: 7 %
Neutro Abs: 8.3 10*3/uL — ABNORMAL HIGH (ref 1.7–7.7)
Neutrophils Relative %: 88 %
Platelets: 196 10*3/uL (ref 150–400)
RBC: 4.24 MIL/uL (ref 3.87–5.11)
RDW: 15.3 % (ref 11.5–15.5)
WBC: 9.3 10*3/uL (ref 4.0–10.5)
nRBC: 0 % (ref 0.0–0.2)

## 2023-01-28 LAB — COMPREHENSIVE METABOLIC PANEL
ALT: 12 U/L (ref 0–44)
AST: 19 U/L (ref 15–41)
Albumin: 2.9 g/dL — ABNORMAL LOW (ref 3.5–5.0)
Alkaline Phosphatase: 74 U/L (ref 38–126)
Anion gap: 11 (ref 5–15)
BUN: 17 mg/dL (ref 8–23)
CO2: 27 mmol/L (ref 22–32)
Calcium: 8.4 mg/dL — ABNORMAL LOW (ref 8.9–10.3)
Chloride: 92 mmol/L — ABNORMAL LOW (ref 98–111)
Creatinine, Ser: 0.61 mg/dL (ref 0.44–1.00)
GFR, Estimated: >60 mL/min (ref >60)
Glucose, Bld: 116 mg/dL — ABNORMAL HIGH (ref 70–99)
Potassium: 3.5 mmol/L (ref 3.5–5.1)
Sodium: 130 mmol/L — ABNORMAL LOW (ref 135–145)
Total Bilirubin: 0.5 mg/dL (ref 0.3–1.2)
Total Protein: 6.8 g/dL (ref 6.5–8.1)

## 2023-01-28 MED ORDER — OMEPRAZOLE 20 MG PO CPDR: 20.0000 mg | DELAYED_RELEASE_CAPSULE | Freq: Every day | ORAL | 0 refills | Status: DC | PRN

## 2023-01-28 NOTE — Progress Notes (Signed)
Pt and caregiver in for follow up, reports was discharged from hospital a week ago. Pt reports shortness of breath of minimal exertion.  Pt request refill of tessalon perles and also request something to help with reflux.

## 2023-01-28 NOTE — Assessment & Plan Note (Addendum)
Sinus tachycardia. Likely Multifactorial due to poor oral intake, deconditioning, cancer Encourage oral hydration.

## 2023-01-28 NOTE — Progress Notes (Signed)
Hematology/Oncology Progress note [phone] [phone]  REASON FOR VISIT Follow up for metastatic lung cancer.   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   cT4 N3 M1-  KRASG12C mutation, currently on second line treatment with Lumakras [Patient has high TMB, TPS less than 1%.  Immunotherapy in combination with chemotherapy will be other options in the future if lumakras is not effective] CT femur showed enlargement of right femur lesion, no fracture.  Labs reviewed and discussed with patient. Recommend patient to resume Lumakras.  CT in early and late January showed stable disease, Feb CT showed slight progression of nodularity, possibly due to her being off [place] MRI brain patient declined.  She is not interested in getting CT brain done either   Encounter for antineoplastic chemotherapy Treatment is listed above.  Neoplasm related pain Right thigh pain, lower back pain has improved.  status post palliative radiation. Continue OxyContin 20mg  twice daily, and oxycodone 5-10mg  Q4h PRN as directed.     Malignant neoplasm metastatic to bone [facility]) patient declines bisphosphonate. Recent CT scan showed increased right femur lesion, at risk of pathological fracture.  Status post IM nail with Dr. Harlow Mares on 01/01/2023   Weight loss Follow up with nutritionist.  Recommend nutrition supplementation.   Tachycardia Sinus tachycardia. Likely Multifactorial due to poor oral intake, deconditioning, cancer Encourage oral hydration.   Chronic respiratory failure with hypoxia [facility]) Encourage patient to utilize home oxygen  as needed. She will establish care with pulmonology Dr. Patsey Berthold on 02/05/2023   Orders Placed This Encounter  Procedures   CBC with Differential/Platelet    Standing Status:   Future    Standing Expiration Date:   01/29/2024   Comprehensive metabolic panel    Standing Status:   Future    Standing Expiration Date:   01/28/2024   Ambulatory Referral to [facility] Nutrition    Referral Priority:   Routine    Referral Type:   Consultation    Referral Reason:   Specialty Services Required    Number of Visits Requested:   1    Follow up in 4 weeks.  All questions were answered. The patient knows to call the clinic with any problems, questions or concerns.  [name], MD, PhD [facility] Hematology Oncology 01/28/2023    PERTINENT ONCOLOGY HISTORY 62 y.o. female with past medical history including GERD, COPD, current everyday smoker presents for follow-up of lung mass and pleural effusion. 10/20/2020, patient was brought to ED via EMS due to generalized weakness, dizziness, chest pain shortness of breath.  She was recently diagnosed with COPD approximately 1 month ago by primary care provider.  Also unintentional weight loss during the last few months. Image work-up showed right-sided pleural effusion.  She underwent right thoracentesis.  With removal of 2.4 L of fluid. 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  Metastatic disease to low cervical/thoracic nodes, lung, right pleural space and bones.  Moderate to large right pleural effusion.  SVC narrowing. 10/21/2020 MRI brain is negative for metastasis.  Mild chronic microvascular ischemic changes in the white matter.  Negative for acute infarct  Regarding to the SVC narrowing, patient was seen by vascular surgeon and was recommended no intervention inpatient.  Patient to follow-up outpatient with vascular surgeon for evaluation. Patient also was treated for  COPD exacerbation with hypoxia on exertion. Qualifies for home oxygen and hospitalist arrange home health and a nebulizer.  Patient was given a course of prednisone taper and empiric Levaquin. Patient was discharged and present today to follow-up with cytology results and further management plan  10/27/2020 PET scan showed right upper lobe primary bronchogenic mass with direct invasion into mediastinum. Metastatic disease to the right pleural space, lung, bone, nodes of the chest and less so lower leg/upper abdomen. Hypermetabolic him along the course of the right axillary vein with concurrent subtle hyper attenuation within the axillary vein and SVC.  This continues to the level of SVC narrowing.  Suspicious for SVC occlusion and developing thrombus. Moderate right pleural effusion and small pericardial effusion.  # # left supraclavicular mass biopsy- pathology is positive for adenocarcinoma. positive for CK7, with patchy, weak CK20.  They are  negative for TTF1, NapsinA, GATA3, CDX2, and Pax8.  The findings are  nonspecific; but may be compatible with a poorly differentiated  adenocarcinoma of lung origin, especially given the imaging findings  She also had another repeat thoracentesis and fluid cytology showed malignancy.  #NGS showed AXIN 1 [id number], DIS2 M156fs, KRAS G12C, PRDM1 M1 [id number]*, [id number], MS stable, TPS <1%   11/14/2020- 12/12/2020.  #Bronchial obstruction and SVC occlusion.  s/p palliative radiation #After 2 cycles of chemtherapy, patient had CT scan done due to shortness of breath.  01/05/2021, CT chest angiogram PE protocol showed no evidence of pulmonary embolism.  Stable disease.  Right upper lobe mass about 6.7 x 5.3 cm.  Widespread metastatic disease, lung and bone involvement. Interval development of right-sided hydronephrosis. 03/06/2021 finished T2 palliative radiation 03/22/2021, 6 cycles of carboplatin/Alimta/bevacizumab.  Infusion reaction of carboplatin during  cycle 6.  Carboplatin discontinued. 04/07/2021 CT chest abdomen pelvis-partial response Mild decrease in size of right lung mass, mildly decreased mediastinal and hilar adenopathy.  Extensive right-sided pleural metastasis unchanged.  Similar appearance of diffuse bilateral pulmonary nodules.  Multifocal lytic sclerotic bone metastasis.  New superior endplate deformity of T6 04/12/2021 - 09/08/2021, patient was continued on bevacizumab and Alimta.  09/21/2021, bone scan showed multifocal abnormal uptake including mid to distal shaft of right femur, left femoral neck and trochanter, left sacroiliac region, left ischium.  Focal activity at the proximal tibia on the left.  Multi focal heterogeneous bilateral rib activity.  Focal activity at the sternum. 09/27/2021, CT chest abdomen pelvis with contrast showed progression of infrahilar left lower lobe with a concerning clearly progressive masslike consolidation opacity.  Mild progression of small left pleural effusion.  Similar experience of extensive irregular right-sided pleural disease.  Similar experience of bony metastasis.  09/29/2021 extra cycle of maintenance bevacizumab and Alimta while waiting for approval of Lumakras  10/20/2021 started on Lumakras  01/09/2022, CT chest abdomen pelvis with contrast showed no substantial interval changes in exam.  Stable disease. 03/20/2022, CT chest abdomen pelvis with contrast showed unchanged circumferential pleural thickening/pleural nodularity of the right hemithorax, left infrahilar mass and bilateral pulmonary nodules.  Stable right perihilar consolidation likely postradiation changes.  Unchanged numerous sclerotic osseous lesions.  Aortic atherosclerosis.  10/12/22 CT chest abdomen pelvis w contrast 1. No significant change in post treatment appearance of the chest.Perihilar post radiation fibrosis and consolidation of the right lung, mass of the superior segment left lower lobe,  extensive right-greater-than-left  pleural thickening and nodularity, pleural effusions, and numerous bilateral pulmonary nodules are unchanged. 2. Unchanged matted post treatment appearance of soft tissue throughout the mediastinum and right hilum. Unchanged enlarged left epicardial lymph nodes without other discretely visualized lymph nodes in the chest. 3. Similar appearance of a lytic osseous metastatic lesion of the base of the right femoral neck, with additional unchanged widespread  sclerotic osseous metastatic disease throughout the included axial and proximal appendicular skeleton. 4. No evidence of new metastatic disease nor soft tissue metastatic disease in the abdomen or pelvis.     INTERVAL HISTORY Brittney Fisher is a 62 y.o. female who has above history reviewed by me today presents for follow up visit for management of metastatic lung cancer  12/24/2022 CT chest abdomen pelvis with contrast showed Bilateral peripheral pleural nodules are mildly increased in size.  Bilateral small effusions.  Stable consolidation at the left lung base.  Stable ill-defined tissue in the mediastinum suggesting treated tumor infiltrates. no evidence of metastatic disease in abdomen or pelvis.  Stable sclerotic metastasis in the spine and pelvis.  Stable lytic lesion in the right femoral neck may be at risk of pathological fracture. 01/01/2023 -01/02/2023 s/p  IM nail with Dr. Harlow Mares on 01/01/2023  01/11/2023-01/18/2023 admitted for acute COPD exacerbation, patient received antibiotics, bronchodilators and steroids.  Lumakras was held since that admission. CT angio chest PE protocol showed no acute pulmonary embolus.  Stable appearance of the chest from earlier this month.  Ill-defined soft tissue planes within the mediastinum and central right lung are unchanged from prior.  Stable osseous metastatic disease. 01/17/2023 CT angio chest pulmonary embolism protocol showed no acute PE.  Slight interval increase in the size of pleural and parenchymal  nodularity seen previously.  Stable diffuse sclerotic bone metastasis.  Aortic atherosclerosis.  01/21/2023 - 01/22/2023, ER visits for dyspnea, patient was discharged with home oxygen.  Today patient was accompanied by daughter.  She continues to have shortness of breath with minimal exertion.  She uses home oxygen as needed.  Appetite is poor.  She is not eating well.  She has lost 14 pounds since last visit in early January.   Review of Systems  Constitutional:  Positive for appetite change, fatigue and unexpected weight change. Negative for chills and fever.  HENT:   Negative for hearing loss and voice change.   Eyes:  Negative for eye problems.  Respiratory:  Positive for shortness of breath. Negative for chest tightness and cough.   Cardiovascular:  Negative for chest pain.  Gastrointestinal:  Negative for abdominal distention, abdominal pain and blood in stool.  Endocrine: Negative for hot flashes.  Genitourinary:  Negative for difficulty urinating and frequency.   Musculoskeletal:  Negative for arthralgias.       Right thigh pain  Skin:  Negative for itching and rash.  Neurological:  Negative for extremity weakness.  Hematological:  Negative for adenopathy.  Psychiatric/Behavioral:  Positive for sleep disturbance. Negative for confusion.       Allergies  Allergen Reactions   Carboplatin Shortness Of Breath, Itching and Cough     Past Medical History:  Diagnosis Date   COPD (chronic obstructive pulmonary disease) (HCC)    GERD (gastroesophageal reflux disease)    Metastatic lung cancer (metastasis from lung to other site) ([place]) 11/02/2020     Past Surgical History:  Procedure Laterality Date   COLONOSCOPY WITH ESOPHAGOGASTRODUODENOSCOPY (EGD)     COLONOSCOPY WITH PROPOFOL N/A 11/14/2015   Procedure: COLONOSCOPY WITH  PROPOFOL;  Surgeon: Hulen Luster, MD;  Location: [facility] ENDOSCOPY;  Service: Gastroenterology;  Laterality: N/A;   ESOPHAGOGASTRODUODENOSCOPY (EGD) WITH PROPOFOL  N/A 11/14/2015   Procedure: ESOPHAGOGASTRODUODENOSCOPY (EGD) WITH PROPOFOL;  Surgeon: Hulen Luster, MD;  Location: [facility] ENDOSCOPY;  Service: Gastroenterology;  Laterality: N/A;   FEMUR IM NAIL Right 01/01/2023   Procedure: TREATMENT, INTERTROCHANTERIC FEMORAL FRACTURE, WITH INTRAMEDULLARY IMPLANT (SURG);  Surgeon: Lovell Sheehan, MD;  Location: [facility] ORS;  Service: Orthopedics;  Laterality: Right;    Social History   Socioeconomic History   Marital status: Widowed    Spouse name: Not on file   Number of children: Not on file   Years of education: Not on file   Highest education level: Not on file  Occupational History   Not on file  Tobacco Use   Smoking status: Former    Types: Cigarettes    Quit date: 09/2021    Years since quitting: 1.3   Smokeless tobacco: Never  Vaping Use   Vaping Use: Never used  Substance and Sexual Activity   Alcohol use: Never   Drug use: Never   Sexual activity: Not Currently  Other Topics Concern   Not on file  Social History Narrative   Not on file   Social Determinants of Health   Financial Resource Strain: Not on file  Food Insecurity: No Food Insecurity (01/12/2023)   Hunger Vital Sign    Worried About Running Out of Food in the Last Year: Never true    Ran Out of Food in the Last Year: Never true  Transportation Needs: No Transportation Needs (01/12/2023)   PRAPARE - Hydrologist (Medical): No    Lack of Transportation (Non-Medical): No  Physical Activity: Not on file  Stress: Not on file  Social Connections: Not on file  Intimate Partner Violence: Not At Risk (01/12/2023)   Humiliation, Afraid, Rape, and Kick questionnaire    Fear of Current or Ex-Partner: No    Emotionally Abused: No    Physically Abused: No    Sexually Abused: No    Family History  Problem Relation Age of Onset   Breast cancer Cousin 49   Diabetes type II Mother    Hypertension Mother    Colon cancer Father    Hypertension Father       Current Outpatient Medications:    acetaminophen (TYLENOL) 500 MG tablet, Take 1,000 mg by mouth every 8 (eight) hours as needed for moderate pain. , Disp: , Rfl:    albuterol (VENTOLIN HFA) 108 (90 Base) MCG/ACT inhaler, Inhale 2 puffs into the lungs every 6 (six) hours as needed for wheezing or shortness of breath., Disp: 8 g, Rfl: 2   benzonatate (TESSALON) 100 MG capsule, Take 1 capsule by mouth three times daily as needed for cough (Patient taking differently: 100 mg 3 (three) times daily as needed for cough. Take 1 capsule by mouth three times daily as needed for cough), Disp: 60 capsule, Rfl: 0   fluticasone-salmeterol (ADVAIR DISKUS) 250-50 MCG/ACT AEPB, Inhale 1 puff into the lungs in the morning and at bedtime., Disp: 60 each, Rfl: 0   ipratropium-albuterol (DUONEB) 0.5-2.5 (3) MG/3ML SOLN, Take 3 mLs by nebulization 3 (three) times daily., Disp: 360 mL, Rfl: 1   omeprazole (PRILOSEC) 20 MG capsule, Take 1 capsule (20 mg total) by mouth daily as needed. Take Lumakras 4 hr before or 10 hr after taking omeprazole., Disp: 30 capsule, Rfl: 0   oxyCODONE (OXY  IR/ROXICODONE) 5 MG immediate release tablet, Take 1-2 tablets (5-10 mg total) by mouth every 4 (four) hours as needed for moderate pain or severe pain., Disp: 120 tablet, Rfl: 0   oxyCODONE (OXYCONTIN) 20 mg 12 hr tablet, Take 1 tablet (20 mg total) by mouth every 12 (twelve) hours., Disp: 90 tablet, Rfl: 0   protein supplement shake (PREMIER PROTEIN) LIQD, Take 11 oz by mouth daily., Disp: , Rfl:    psyllium (HYDROCIL/METAMUCIL) 95 % PACK, Take 1 packet by mouth daily., Disp: 30 packet, Rfl: 0   folic acid (FOLVITE) 1 MG tablet, Take 1 tablet (1 mg total) by mouth daily. Start 5-7 days before Alimta chemotherapy. Continue until 21 days after Alimta completed. (Patient not taking: Reported on 01/12/2023), Disp: 100 tablet, Rfl: 3   gabapentin (NEURONTIN) 100 MG capsule, Take 1 capsule (100 mg total) by mouth 3 (three) times daily.  (Patient not taking: Reported on 01/28/2023), Disp: 90 capsule, Rfl: 0   LUMAKRAS 120 MG tablet, TAKE 8 TABLETS BY MOUTH DAILY (Patient not taking: Reported on 01/28/2023), Disp: 240 tablet, Rfl: 2   naloxegol oxalate (MOVANTIK) 25 MG TABS tablet, Take 1 tablet (25 mg total) by mouth daily. (Patient not taking: Reported on 01/28/2023), Disp: 30 tablet, Rfl: 0   naloxone (NARCAN) nasal spray 4 mg/0.1 mL, For suspected overdose of narcotics. (Patient not taking: Reported on 01/28/2023), Disp: 1 each, Rfl: 0  Physical exam: ECOG 2 Vitals:   01/28/23 1520  BP: 121/73  Pulse: (!) 120  Resp: 14  Temp: 97.6 F (36.4 C)  TempSrc: Tympanic  SpO2: 100%  Weight: 120 lb 1.6 oz (54.5 kg)   Physical Exam Constitutional:      General: She is not in acute distress.    Comments: Thin built female walks independently  HENT:     Head: Normocephalic and atraumatic.  Eyes:     General: No scleral icterus. Cardiovascular:     Rate and Rhythm: Regular rhythm. Tachycardia present.     Heart sounds: Normal heart sounds.  Pulmonary:     Effort: Pulmonary effort is normal. No respiratory distress.     Breath sounds: No wheezing.     Comments: Slightly decreased breath sound bilaterally.  Abdominal:     General: Bowel sounds are normal. There is no distension.     Palpations: Abdomen is soft.  Musculoskeletal:        General: No deformity. Normal range of motion.     Cervical back: Normal range of motion and neck supple.  Skin:    General: Skin is warm and dry.     Findings: No erythema or rash.  Neurological:     Mental Status: She is alert and oriented to person, place, and time. Mental status is at baseline.     Cranial Nerves: No cranial nerve deficit.     Coordination: Coordination normal.  Psychiatric:        Mood and Affect: Mood normal.    LABORATORY STUDIES     Latest Ref Rng & Units 01/28/2023    2:41 PM 01/21/2023    4:43 PM 01/21/2023    9:50 AM  CBC  WBC 4.0 - 10.5 K/uL 9.3  9.5   11.8   Hemoglobin 12.0 - 15.0 g/dL 11.7  11.8  12.0   Hematocrit 36.0 - 46.0 % 36.1  37.2  37.3   Platelets 150 - 400 K/uL 196  230  219       Latest Ref Rng & Units  01/28/2023    2:41 PM 01/21/2023    4:43 PM 01/21/2023    9:50 AM  CMP  Glucose 70 - 99 mg/dL 116  154  122   BUN 8 - 23 mg/dL 17  22  21    Creatinine 0.44 - 1.00 mg/dL 0.61  0.69  0.69   Sodium 135 - 145 mmol/L 130  130  130   Potassium 3.5 - 5.1 mmol/L 3.5  4.5  4.2   Chloride 98 - 111 mmol/L 92  94  93   CO2 22 - 32 mmol/L 27  22  24    Calcium 8.9 - 10.3 mg/dL 8.4  8.6  8.8   Total Protein 6.5 - 8.1 g/dL 6.8   6.9   Total Bilirubin 0.3 - 1.2 mg/dL 0.5   0.7   Alkaline Phos 38 - 126 U/L 74   67   AST 15 - 41 U/L 19   16   ALT 0 - 44 U/L 12   10      RADIOGRAPHIC STUDIES: I have personally reviewed the radiological images as listed and agreed with the findings in the report. US Venous Img Lower Bilateral (DVT)  Result Date: 01/21/2023 CLINICAL DATA:  Shortness of breath. EXAM: BILATERAL LOWER EXTREMITY VENOUS DOPPLER ULTRASOUND TECHNIQUE: Gray-scale sonography with compression, as well as color and duplex ultrasound, were performed to evaluate the deep venous system(s) from the level of the common femoral vein through the popliteal and proximal calf veins. COMPARISON:  None Available. FINDINGS: VENOUS Normal compressibility of the common femoral, superficial femoral, and popliteal veins, as well as the visualized calf veins. Visualized portions of profunda femoral vein and great saphenous vein unremarkable. No filling defects to suggest DVT on grayscale or color Doppler imaging. Doppler waveforms show normal direction of venous flow, normal respiratory plasticity and response to augmentation. OTHER None. Limitations: none IMPRESSION: No sonographic evidence of bilateral lower extremity DVT. Electronically Signed   By: Keith Rake M.D.   On: 01/21/2023 20:55   DG Chest 2 View  Result Date: 01/21/2023 CLINICAL DATA:   Shortness of breath with movement and walking, stage IV lung cancer EXAM: CHEST - 2 VIEW COMPARISON:  Earlier study of 01/21/2023 at 1033 hours FINDINGS: Normal heart size, mediastinal contours, and pulmonary vascularity. Changes of COPD with chronic opacity at RIGHT apex and perihilar region likely related to prior therapy. Persistent small bibasilar pleural effusions greater on RIGHT. Nodular foci in both lungs consistent with known metastatic disease. No pneumothorax. Bones demineralized with chronic height loss of an upper thoracic vertebra unchanged. Scattered sclerotic foci consistent with osseous metastases in spine, RIGHT scapula, proximal RIGHT humerus. IMPRESSION: Post therapy changes in the RIGHT lung. Pulmonary metastatic disease with small bibasilar effusions greater on RIGHT. Osseous metastases. Emphysema (ICD10-J43.9). Electronically Signed   By: Lavonia Dana M.D.   On: 01/21/2023 17:16   DG Chest Portable 1 View  Result Date: 01/21/2023 CLINICAL DATA:  Shortness of breath on exertion. Stage IV lung cancer. EXAM: PORTABLE CHEST 1 VIEW COMPARISON:  01/15/2023 and CT chest 01/17/2023. FINDINGS: Trachea is midline. Heart size normal. Extensive post radiation in the upper right hemithorax and right perihilar region. Bilateral pleuroparenchymal nodularity, as on comparison CT. Small bilateral pleural effusions. Findings are unchanged from 01/15/2023. IMPRESSION: 1. Pulmonary metastatic disease. 2. Small bilateral pleural effusions. 3. Post radiation changes in the upper right hemithorax and right perihilar region. 4. No superimposed acute abnormality. Electronically Signed   By: Lorin Picket M.D.  On: 01/21/2023 10:46   CT Angio Chest Pulmonary Embolism (PE) W or WO Contrast  Result Date: 01/17/2023 CLINICAL DATA:  COPD, history of metastatic lung cancer, worsening dyspnea and cough EXAM: CT ANGIOGRAPHY CHEST WITH CONTRAST TECHNIQUE: Multidetector CT imaging of the chest was performed using the  standard protocol during bolus administration of intravenous contrast. Multiplanar CT image reconstructions and MIPs were obtained to evaluate the vascular anatomy. RADIATION DOSE REDUCTION: This exam was performed according to the departmental dose-optimization program which includes automated exposure control, adjustment of the mA and/or kV according to patient size and/or use of iterative reconstruction technique. CONTRAST:  54mL OMNIPAQUE IOHEXOL 350 MG/ML SOLN COMPARISON:  01/11/2023 FINDINGS: Cardiovascular: This is a technically adequate evaluation of the pulmonary vasculature. No filling defects or pulmonary emboli. The heart is unremarkable without pericardial effusion. No evidence of thoracic aortic aneurysm or dissection. Stable atherosclerosis of the aorta and coronary vasculature. 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 Mediastinum/Nodes: Loss of normal fat planes throughout the mediastinum may reflect post therapeutic change or sequela of metastatic disease. Discrete adenopathy or mass is difficult to measure, but the appearance is stable since the previous evaluation. Thyroid, trachea, and esophagus are grossly unremarkable. Lungs/Pleura: Chronic areas of consolidation within the bilateral lower lobes are again noted and unchanged. Extensive pleural nodularity again noted bilaterally, right greater than left. Chronic scarring and bronchovascular thickening within the right hilar region unchanged. Index nodules measured previously are as follows: Left upper lobe, image 50/6, 8 mm.  Previously 7 mm. Right lower lobe, image 103/6, 16 mm.  Previously 14 mm. Right middle lobe, image 103/6, 13 mm.  Previously 12 mm. Left upper lobe, image 99/6, 13 mm.  Previously 12 mm. Trace bilateral pleural effusions are again noted.  No pneumothorax. Upper Abdomen: No acute abnormality.  Musculoskeletal: Stable sclerotic metastatic disease within the bilateral scapula, sternum, and thoracic spine. There are no acute displaced fractures. Reconstructed images demonstrate no additional findings. Review of the MIP images confirms the above findings. IMPRESSION: 1. No evidence of pulmonary embolus. 2. Findings consistent with known metastatic lung cancer, with slight interval increase in size of the pleural and parenchymal nodularity seen previously. 3. Stable diffuse sclerotic bony metastases. 4.  Aortic Atherosclerosis (ICD10-I70.0). Electronically Signed   By: Randa Ngo M.D.   On: 01/17/2023 16:37   ECHOCARDIOGRAM COMPLETE  Result Date: 01/16/2023    ECHOCARDIOGRAM REPORT   Patient Name:   Brittney Fisher Date of Exam: 01/16/2023 Medical Rec #:  [id number]        Height:       71.0 in Accession #:    [id number]       Weight:       133.8 lb Date of Birth:  1961/07/25        BSA:          1.778 m Patient Age:    68 years         BP:           129/71 mmHg Patient Gender: F                HR:           110 bpm. Exam Location:  [facility] Procedure: 2D Echo, Color Doppler and Cardiac Doppler Indications:    Acute respiratory distress R06.03  History:        Patient has no prior history of Echocardiogram  examinations.                 COPD.  Sonographer:    Sherrie Sport Referring Phys: [id number] [facility] Diagnosing      Kate Sable MD Phys:  Sonographer Comments: Image acquisition challenging due to patient body habitus. IMPRESSIONS  1. Left ventricular ejection fraction, by estimation, is 60 to 65%. The left ventricle has normal function. The left ventricle has no regional wall motion abnormalities. Left ventricular diastolic parameters are consistent with Grade I diastolic dysfunction (impaired relaxation).  2. Right ventricular systolic function is normal. The right ventricular size is normal.  3. The mitral valve is normal in structure. No evidence of mitral valve regurgitation.  4. The  aortic valve is tricuspid. Aortic valve regurgitation is not visualized. FINDINGS  Left Ventricle: Left ventricular ejection fraction, by estimation, is 60 to 65%. The left ventricle has normal function. The left ventricle has no regional wall motion abnormalities. The left ventricular internal cavity size was normal in size. There is  no left ventricular hypertrophy. Left ventricular diastolic parameters are consistent with Grade I diastolic dysfunction (impaired relaxation). Right Ventricle: The right ventricular size is normal. No increase in right ventricular wall thickness. Right ventricular systolic function is normal. Left Atrium: Left atrial size was normal in size. Right Atrium: Right atrial size was normal in size. Pericardium: There is no evidence of pericardial effusion. Mitral Valve: The mitral valve is normal in structure. No evidence of mitral valve regurgitation. Tricuspid Valve: The tricuspid valve is normal in structure. Tricuspid valve regurgitation is not demonstrated. Aortic Valve: The aortic valve is tricuspid. Aortic valve regurgitation is not visualized. Aortic valve mean gradient measures 3.0 mmHg. Aortic valve peak gradient measures 4.8 mmHg. Aortic valve area, by VTI measures 2.30 cm. Pulmonic Valve: The pulmonic valve was normal in structure. Pulmonic valve regurgitation is not visualized. Aorta: The aortic root is normal in size and structure. Venous: The inferior vena cava was not well visualized. IAS/Shunts: No atrial level shunt detected by color flow Doppler.  LEFT VENTRICLE PLAX 2D LVIDd:         3.20 cm   Diastology LVIDs:         1.90 cm   LV e' medial:    7.07 cm/s LV PW:         0.90 cm   LV E/e' medial:  9.1 LV IVS:        1.10 cm   LV e' lateral:   9.25 cm/s LVOT diam:     1.90 cm   LV E/e' lateral: 7.0 LV SV:         37 LV SV Index:   21 LVOT Area:     2.84 cm  RIGHT VENTRICLE RV Basal diam:  3.60 cm RV Mid diam:    2.70 cm RV S prime:     18.40 cm/s TAPSE (M-mode): 2.1 cm  LEFT ATRIUM           Index       RIGHT ATRIUM          Index LA diam:      1.50 cm 0.84 cm/m  RA Area:     9.50 cm LA Vol (A2C): 6.9 ml  3.88 ml/m  RA Volume:   17.30 ml 9.73 ml/m LA Vol (A4C): 10.0 ml 5.63 ml/m  AORTIC VALVE AV Area (Vmax):    2.35 cm AV Area (Vmean):   2.05 cm AV Area (VTI):  2.30 cm AV Vmax:           109.00 cm/s AV Vmean:          76.900 cm/s AV VTI:            0.160 m AV Peak Grad:      4.8 mmHg AV Mean Grad:      3.0 mmHg LVOT Vmax:         90.20 cm/s LVOT Vmean:        55.600 cm/s LVOT VTI:          0.130 m LVOT/AV VTI ratio: 0.81  AORTA Ao Root diam: 3.20 cm MITRAL VALVE               TRICUSPID VALVE MV Area (PHT): 6.90 cm    TR Peak grad:   31.6 mmHg MV Decel Time: 110 msec    TR Vmax:        281.00 cm/s MV E velocity: 64.50 cm/s MV A velocity: 96.90 cm/s  SHUNTS MV E/A ratio:  0.67        Systemic VTI:  0.13 m                            Systemic Diam: 1.90 cm Kate Sable MD Electronically signed by Kate Sable MD Signature Date/Time: 01/16/2023/4:29:24 PM    Final    DG Chest Port 1 View  Result Date: 01/15/2023 CLINICAL DATA:  Shortness of breath.  COPD.  Metastatic lung cancer. EXAM: PORTABLE CHEST 1 VIEW COMPARISON:  01/11/2023 FINDINGS: Heart size remains normal. Chronic volume loss and scarring in the right upper chest remains stable. Chronic bilateral pleural blunting remains stable. Chronic volume loss/infiltrate in both lower lobes. Compared to the study of 4 days ago, findings appear quite similar. No new process is identified. IMPRESSION: No significant change since the study of 4 days ago. Chronic volume loss and scarring in the right upper chest. 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 Electronically Signed   By: Nelson Chimes M.D.   On: 01/15/2023 08:07   US Abdomen Limited RUQ (LIVER/GB)  Result Date: 01/12/2023 CLINICAL DATA:  62 year old female with history of abdominal pain. EXAM: ULTRASOUND ABDOMEN  LIMITED RIGHT UPPER QUADRANT COMPARISON:  No priors. FINDINGS: Gallbladder: Gallbladder is moderately distended. No evidence of gallstones or biliary sludge. Gallbladder wall thickness is normal. No pericholecystic fluid. Per report from the sonographer, there was no sonographic Murphy's sign on examination. Common bile duct: Diameter: 3.4 mm. Liver: No focal lesion identified. Within normal limits in parenchymal echogenicity. Portal vein is patent on color Doppler imaging with normal direction of blood flow towards the liver. Other: Incidental imaging of the right kidney demonstrates an anechoic lesion with increased through transmission in the interpolar region, compatible with a large parapelvic cyst, estimated to measure approximately 4.3 x 4.0 x 2.8 cm (no imaging follow-up recommended). IMPRESSION: 1. No acute findings. Specifically, no gallstones and no evidence of acute cholecystitis. Electronically Signed   By: Vinnie Langton M.D.   On: 01/12/2023 05:42   CT Angio Chest PE W and/or Wo Contrast  Result Date: 01/11/2023 CLINICAL DATA:  Pulmonary embolism (PE) suspected, high prob Patient with history of metastatic lung cancer. EXAM: CT ANGIOGRAPHY CHEST WITH CONTRAST TECHNIQUE: Multidetector CT imaging of the chest was performed using the standard protocol during bolus administration of intravenous contrast. Multiplanar CT image reconstructions and MIPs were obtained to evaluate the vascular  anatomy. RADIATION DOSE REDUCTION: This exam was performed according to the departmental dose-optimization program which includes automated exposure control, adjustment of the mA and/or kV according to patient size and/or use of iterative reconstruction technique. CONTRAST:  26mL OMNIPAQUE IOHEXOL 350 MG/ML SOLN COMPARISON:  Radiograph earlier today. Staging chest CT 12/24/2022 FINDINGS: Cardiovascular: There are no filling defects within the pulmonary arteries to suggest pulmonary embolus. Aortic atherosclerosis  and tortuosity. No acute aortic findings. Stable heart size. Trace pericardial effusion. Bilateral anterior chest wall collaterals, however no evidence of SVC occlusion. Mediastinum/Nodes: Ill-defined soft tissue planes within the mediastinum again seen without significant interval change. Ill-defined soft tissue density seen anterior to the trachea, at the level of the carina and adjacent to the right mainstem bronchus. 14 mm central left hilar lymph node versus pulmonary nodule. Patulous esophagus. Lungs/Pleura: Again seen pleural thickening the right lung apex, near circumferential throughout the right hemithorax with areas of pleural nodularity. Areas of peripheral pleural based nodularity in the right hemithorax are again seen. Representative right lower lobe pleural base nodule measures 15 x 12 mm series 7, image 97, unchanged by my retrospective measurement. Peri diaphragmatic nodule in the right lung base measures 14 mm series 7, image 111, also unchanged. Pleural based right middle lobe nodule measures 11 mm series 7, image 109, unchanged. Interstitial nodularity throughout the right lower lobe with bronchial thickening is stable. Ill-defined soft tissue density in the central right perihilar lung is unchanged. Small right pleural effusion. Consolidation in the left lower lobe, most prominent centrally, with heterogeneous parenchyma, stable from prior. Subpleural left upper lobe nodule measures 12 mm series 7, image 102, previously 10 mm. Subpleural left upper lobe nodule measuring 7 mm series 7, image 58, previously 6 mm. Similar left pleural thickening with small left pleural effusion. No definite acute airspace disease. Upper Abdomen: Assessed on abdominopelvic CT earlier today. Musculoskeletal: Multifocal sclerotic osseous lesions typical of metastatic disease. Exaggerated upper thoracic kyphosis. Unchanged upper thoracic mild compression deformities. No new osseous findings. Review of the MIP images  confirms the above findings. IMPRESSION: 1. No pulmonary embolus. 2. History of known lung cancer, with stable appearance of the chest from earlier this month. Recommend continued oncologic follow-up per protocol. 3. Ill-defined soft tissue planes within the mediastinum and central right lung are unchanged from prior exam. 4. Stable osseous metastatic disease. Aortic Atherosclerosis (ICD10-I70.0). Electronically Signed   By: Keith Rake M.D.   On: 01/11/2023 21:21   CT ABDOMEN PELVIS WO CONTRAST  Result Date: 01/11/2023 CLINICAL DATA:  Left lower quadrant abdominal pain. 2 days. History of non-small-cell lung cancer. EXAM: CT ABDOMEN AND PELVIS WITHOUT CONTRAST TECHNIQUE: Multidetector CT imaging of the abdomen and pelvis was performed following the standard protocol without IV contrast. RADIATION DOSE REDUCTION: This exam was performed according to the departmental dose-optimization program which includes automated exposure control, adjustment of the mA and/or kV according to patient size and/or use of iterative reconstruction technique. COMPARISON:  CT 12/24/2022. older CT sidewall. X-ray 01/11/2023 earlier FINDINGS: Lower chest: Once again at the lung bases are pleural effusions, pleural thickening and masslike areas with reticulonodular changes, similar to the prior examination. Please correlate with prior chest CT and clinical history of lung cancer. Pericardial effusion is also seen with some enlarged pre cardiophrenic lymph nodes Hepatobiliary: On this non IV contrast exam, the liver is grossly preserved. The gallbladder is distended but unchanged from prior. Pancreas: Grossly the pancreas has preserved parenchyma. Spleen: Spleen is nonenlarged. Adrenals/Urinary Tract: Stable mild thickening  of the adrenal glands. Bilateral Bosniak 1 renal cysts are stable. No specific imaging follow-up of the cysts in the kidneys. No renal or ureteral stones clearly seen. Contracted urinary bladder. Stomach/Bowel:  Stomach is nondilated. There is some luminal air and fluid. There is some high attenuation seen along the course of the colon with scattered stool. The colon is nondilated. The cecum is in the low right hemipelvis. Very redundant course to loops of colon. Normal appendix identified posterior cecum in the right hemipelvis as seen on coronal series 4, image 39. No clear adjacent inflammatory stranding along the colon. Small bowel is nondilated. Vascular/Lymphatic: Diffuse vascular calcifications along the aorta and branch vessels. Grossly preserved caliber IVC. Reproductive: Uterus and bilateral adnexa are unremarkable. There are some calcifications along the uterus, possible calcified fibroids. Other: Anasarca.  No clear ascites. Musculoskeletal: Scattered sclerotic bone metastases are identified. Please correlate with prior workup. Interval placement of dynamic right hip screw with acute surgical changes. Lytic lesion along the right femoral neck is again seen. Stranding. IMPRESSION: Study limited without the advantage of contrast and lack of intra-abdominal fat. No bowel obstruction, free air or free fluid. Diffuse scattered colonic stool. Normal appendix. Anasarca. Interval placement of a dynamic right hip screw transfixing the lucent lesion in the femoral neck. There is sclerotic bone metastases elsewhere as per prior history. Distended gallbladder but unchanged from previous. If there is concern of acute gallbladder pathology ultrasound may be useful. Extensive abnormality along the lung bases consistent with known history of lung cancer. Please correlate with recent workup. Electronically Signed   By: Jill Side M.D.   On: 01/11/2023 19:32   DG Chest Portable 1 View  Result Date: 01/11/2023 CLINICAL DATA:  Shortness of breath. Cancer. Evaluate for effusion or edema. EXAM: PORTABLE CHEST 1 VIEW COMPARISON:  Radiograph 12/21/2022. CT 12/24/2022 FINDINGS: Chronic volume loss in the right hemithorax. Chronic  right apical opacity, ill-defined right basilar opacities and right basilar effusion. Left infrahilar opacity and small left effusion are similar to prior CT. No pulmonary edema. Stable heart size and mediastinal contours. No pneumothorax. Scattered sclerotic lesions are better appreciated on prior CT. IMPRESSION: 1. Stable radiographic appearance of the chest. Stable right lung volume loss with apical and basilar opacity and right pleural effusion from earlier this month. 2. Stable left basilar opacity and small left pleural effusion. 3. No pulmonary edema. Electronically Signed   By: Keith Rake M.D.   On: 01/11/2023 19:19   DG Abdomen 1 View  Result Date: 01/11/2023 CLINICAL DATA:  Constipation. Patient reports abdominal pain. EXAM: ABDOMEN - 1 VIEW COMPARISON:  CT 12/24/2022 FINDINGS: Added views of the abdomen obtained. No bowel dilatation to suggest obstruction. Moderate volume of stool in the colon, mild gaseous distension of splenic flexure. No radiopaque calculi or abnormal soft tissue calcifications. Surgical hardware in the right proximal femur is partially visualized. Stable sclerotic densities within the left proximal femur. Sclerotic lesions within the bony pelvis are only faintly visualized by radiograph. Known sclerotic metastatic disease. IMPRESSION: Nonobstructive bowel gas pattern. Moderate volume of colonic stool. Electronically Signed   By: Keith Rake M.D.   On: 01/11/2023 18:10   DG FEMUR, MIN 2 VIEWS RIGHT  Result Date: 01/01/2023 CLINICAL DATA:  Right intramedullary nail EXAM: RIGHT FEMUR 2 VIEWS COMPARISON:  None Available. FINDINGS: Fluoroscopic images were obtained intraoperatively and submitted for post operative interpretation. Right intramedullary nail placement, 10 images were obtained with 1 minute and 27 seconds of fluoroscopy time and 8.23 mGy.  Please see the performing provider's procedural report for further detail. IMPRESSION: Intraoperative fluoroscopic  guidance for right intramedullary nail placement. Electronically Signed   By: Yetta Glassman M.D.   On: 01/01/2023 14:57   DG C-Arm 1-60 Min-No Report  Result Date: 01/01/2023 Fluoroscopy was utilized by the requesting physician.  No radiographic interpretation.   DG C-Arm 1-60 Min-No Report  Result Date: 01/01/2023 Fluoroscopy was utilized by the requesting physician.  No radiographic interpretation.   CT CHEST ABDOMEN PELVIS W CONTRAST  Result Date: 12/24/2022 CLINICAL DATA:  Non-small cell lung cancer. Assess treatment response. Initial diagnosis November 2021. Chemotherapy radiation therapy. Immunotherapy completed. * Tracking Code: BO * EXAM: CT CHEST, ABDOMEN, AND PELVIS WITH CONTRAST TECHNIQUE: Multidetector CT imaging of the chest, abdomen and pelvis was performed following the standard protocol during bolus administration of intravenous contrast. RADIATION DOSE REDUCTION: This exam was performed according to the departmental dose-optimization program which includes automated exposure control, adjustment of the mA and/or kV according to patient size and/or use of iterative reconstruction technique. CONTRAST:  51mL OMNIPAQUE IOHEXOL 300 MG/ML  SOLN COMPARISON:  03/20/2022 FINDINGS: CT CHEST FINDINGS Cardiovascular: No significant vascular findings. Normal heart size. No pericardial effusion. Mediastinum/Nodes: Ill-defined tissue planes within the mediastinum consistent with tumor infiltrate. No change from comparison exam. No axillary supraclavicular adenopathy Lungs/Pleura: Peripheral nodules in the RIGHT lower lobe and along the RIGHT lower lobe pleural surface. For example lateral nodule in the RIGHT lower lobe along the pleural surface measures 16 mm (image 86/3 compared to 12 mm. Nodule over the RIGHT hemidiaphragm measuring 13 mm (image 104/3) compares to 11 mm. Nodule in the RIGHT middle lobe pleural surface measures 11 mm (97/3 compared to 11 mm. There is bronchiectasis and peribronchial  thickening in the RIGHT lobe similar prior. Several new peripheral nodules in the LEFT lung. For example LEFT upper lobe nodule measuring 10 mm (image 91/3) compares to 6 mm. Consolidation in the LEFT lower lobe is similar prior. RIGHT upper lobe nodule measuring 6 mm compares to 5 (image 46/3 Musculoskeletal: Stable sclerotic lesions in the thoracic spine CT ABDOMEN AND PELVIS FINDINGS Hepatobiliary: No focal hepatic lesion. No biliary ductal dilatation. Gallbladder is normal. Common bile duct is normal. Pancreas: Pancreas is normal. No ductal dilatation. No pancreatic inflammation. Spleen: Normal spleen Adrenals/urinary tract: Adrenal glands and kidneys are normal. The ureters and bladder normal. Stomach/Bowel: Stomach, small bowel, appendix, and cecum are normal. The colon and rectosigmoid colon are normal. Vascular/Lymphatic: Abdominal aorta is normal caliber with atherosclerotic calcification. There is no retroperitoneal or periportal lymphadenopathy. No pelvic lymphadenopathy. Reproductive: Uterus and adnexa unremarkable. Other: No peritoneal metastasis Musculoskeletal: Multiple round sclerotic lesions in the proximal femurs, iliac bones, sacrum and lower thoracic spine are unchanged. Lytic lesion within the RIGHT femoral neck measures 2.7 cm unchanged IMPRESSION: CHEST IMPRESSION: 1. Bilateral peripheral pleural nodules are mildly increased in size. 2. Bilateral small effusions. Stable consolidation at the LEFT lung base 3. Stable ill-defined tissue in the mediastinum suggesting treated tumor infiltrate. PELVIS IMPRESSION: 1. No evidence of metastatic disease in the abdomen pelvis. 2. Stable sclerotic metastasis in the spine and pelvis. 3. Stable lytic lesion in the RIGHT femoral neck may be at risk for pathologic fracture. 4.  Aortic Atherosclerosis (ICD10-I70.0). Electronically Signed   By: Suzy Bouchard M.D.   On: 12/24/2022 14:06   [place] CHEST (PLEURAL EFFUSION)  Result Date: 12/21/2022 CLINICAL DATA:   Pleural effusion EXAM: CHEST ULTRASOUND COMPARISON:  None Available. FINDINGS: Focused sonographic exam of the bilateral  chest was performed for fluid assessment. Images demonstrate no significant pleural effusion on either the right or left side. No thoracentesis performed. IMPRESSION: No significant pleural effusion appreciated by ultrasound on either side. No thoracentesis performed. Electronically Signed   By: Albin Felling M.D.   On: 12/21/2022 12:32   DG Chest 2 View  Result Date: 12/21/2022 CLINICAL DATA:  History of lung cancer with several week history of shortness of breath EXAM: CHEST - 2 VIEW COMPARISON:  CT chest dated 10/12/2022 chest radiograph dated 12/30/2020 FINDINGS: Slightly low lung volumes. Persistent right apical opacity and asymmetric patchy right lung opacities. Multifocal right lung nodules are better evaluated on prior chest CT. New peripheral left upper lung nodular opacity. Small to moderate right pleural effusion. Trace left pleural effusion. The heart size and mediastinal contours are within normal limits. The visualized skeletal structures are unremarkable. IMPRESSION: 1. Persistent right apical opacity and asymmetric patchy right lung opacities. Multifocal right lung nodules are better evaluated on prior chest CT. 2. New peripheral left upper lung nodular opacity may represent a focus of infection or metastasis. 3. Small to moderate right pleural effusion. Trace left pleural effusion. Electronically Signed   By: Darrin Nipper M.D.   On: 12/21/2022 09:03   CT Lumbar Spine W Contrast  Result Date: 12/06/2022 CLINICAL DATA:  Right flank and leg pain.  History of lung cancer EXAM: CT LUMBAR SPINE WITH CONTRAST TECHNIQUE: Multidetector CT imaging of the lumbar spine was performed with intravenous contrast administration. RADIATION DOSE REDUCTION: This exam was performed according to the departmental dose-optimization program which includes automated exposure control, adjustment of  the mA and/or kV according to patient size and/or use of iterative reconstruction technique. CONTRAST:  6mL OMNIPAQUE IOHEXOL 350 MG/ML SOLN COMPARISON:  MRI lumbar spine 06/08/2022. CT abdomen pelvis 10/12/2022 FINDINGS: Segmentation: Normal segmentation. Five lumbar vertebra. Lowest disc space L5-S1 Alignment: Mild anterolisthesis L5-S1 otherwise normal alignment Vertebrae: Negative for fracture Numerous sclerotic lesions throughout the lumbar spine as well as the sacrum and iliac bones, stable from the prior CT and MRI. Findings compatible with stable metastatic disease. No new lesions identified. Paraspinal and other soft tissues: Atherosclerotic aorta without aneurysm. No retroperitoneal mass or adenopathy. Subcentimeter left periaortic lymph nodes stable from the prior CT. Disc levels: L1-2: Mild degenerative change.  Negative for stenosis L2-3: Mild disc bulging and mild facet degeneration. Negative for stenosis L3-4: Diffuse bulging of the disc with borderline spinal stenosis. Subarticular stenosis on the right L4-5: Diffuse bulging of the disc and bilateral facet degeneration. Borderline spinal stenosis. Mild subarticular stenosis bilaterally L5-S1: Disc bulging and facet degeneration. Moderate right foraminal narrowing due to spurring and disc bulging. Mild left foraminal narrowing. IMPRESSION: 1. Numerous sclerotic lesions throughout the lumbar spine and pelvis compatible with metastatic disease. These are stable from the prior CT and MRI. No new lesions. 2. Multilevel degenerative change in the lumbar spine. Borderline spinal stenosis L3-4 and L4-5. 3. Moderate right foraminal narrowing L5-S1 due to spurring and disc bulging. Mild left foraminal narrowing L5-S1. 4. Aortic atherosclerosis. Aortic Atherosclerosis (ICD10-I70.0). Electronically Signed   By: Franchot Gallo M.D.   On: 12/06/2022 12:52   CT FEMUR RIGHT W CONTRAST  Result Date: 12/06/2022 CLINICAL DATA:  Right lower back pain radiating to  the right upper leg. EXAM: CT OF THE LOWER RIGHT EXTREMITY WITH CONTRAST TECHNIQUE: Multidetector CT imaging of the lower right extremity was performed according to the standard protocol following intravenous contrast administration. RADIATION DOSE REDUCTION: This exam was performed  according to the departmental dose-optimization program which includes automated exposure control, adjustment of the mA and/or kV according to patient size and/or use of iterative reconstruction technique. CONTRAST:  57mL OMNIPAQUE IOHEXOL 350 MG/ML SOLN COMPARISON:  None Available. FINDINGS: Bones/Joint/Cartilage There is a lytic lesion with endosteal scalloping in the intertrochanteric region measuring approximately 2.1 x 2.4 x 3.3 cm. Few other sclerotic lesions in the pelvis and visualized femur the largest in the mid to distal femoral diaphysis measuring at least 1.0 x 0.7 x 1.6 cm. Ligaments Suboptimally assessed by CT. Muscles and Tendons Muscles are normal in bulk. No intramuscular hematoma or fluid collection. Soft tissues Skin and subcutaneous soft tissues are within normal limits. IMPRESSION: 1. Large lytic lesion with endosteal scalloping in the intertrochanteric region of the right femur measuring at least 2.1 x 2.4 x 3.3 cm CT examination dated October 12, 2022. There is increased risk for a pathological fracture of the femur. 2. Few other sclerotic lesions in the pelvis and visualized femur. Findings are concerning for metastatic disease. Recommend further evaluation with bone scan. 3. Muscles and subcutaneous soft tissues are within normal limits. Electronically Signed   By: Keane Police D.O.   On: 12/06/2022 11:06

## 2023-01-28 NOTE — Assessment & Plan Note (Addendum)
Encourage patient to utilize home oxygen as needed. She will establish care with pulmonology Dr. Patsey Berthold on 02/05/2023

## 2023-01-28 NOTE — Assessment & Plan Note (Addendum)
patient declines bisphosphonate. Recent CT scan showed increased right femur lesion, at risk of pathological fracture.  Status post IM nail with Dr. Harlow Mares on 01/01/2023

## 2023-01-28 NOTE — Assessment & Plan Note (Addendum)
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   cT4 N3 M1-  KRASG12C mutation, currently on second line treatment with Lumakras [Patient has high TMB, TPS less than 1%.  Immunotherapy in combination with chemotherapy will be other options in the future if lumakras is not effective] CT femur showed enlargement of right femur lesion, no fracture.  Labs reviewed and discussed with patient. Recommend patient to resume Lumakras.  CT in early and late January showed stable disease, Feb CT showed slight progression of nodularity, possibly due to her being off [place] [facility] brain patient declined.  She is not interested in getting CT brain done either

## 2023-01-28 NOTE — Assessment & Plan Note (Signed)
Follow up with nutritionist.  Recommend nutrition supplementation.

## 2023-01-28 NOTE — Assessment & Plan Note (Signed)
Right thigh pain, lower back pain has improved.  status post palliative radiation. Continue OxyContin 20mg  twice daily, and oxycodone 5-10mg  Q4h PRN as directed.

## 2023-01-28 NOTE — Assessment & Plan Note (Signed)
Treatment is listed above.

## 2023-01-29 ENCOUNTER — Inpatient Hospital Stay (HOSPITAL_BASED_OUTPATIENT_CLINIC_OR_DEPARTMENT_OTHER): Payer: Medicaid Other | Admitting: Hospice and Palliative Medicine

## 2023-01-29 DIAGNOSIS — C3490 Malignant neoplasm of unspecified part of unspecified bronchus or lung: Secondary | ICD-10-CM | POA: Diagnosis not present

## 2023-01-29 NOTE — Progress Notes (Signed)
Virtual Visit via Telephone Note  I connected with Brittney Fisher on 01/29/23 at 11:50 AM EST by telephone and verified that I am speaking with the correct person using two identifiers.  Location: Patient: Home Provider: Clinic   I discussed the limitations, risks, security and privacy concerns of performing an evaluation and management service by telephone and the availability of in person appointments. I also discussed with the patient that there may be a patient responsible charge related to this service. The patient expressed understanding and agreed to proceed.   History of Present Illness: Brittney Fisher is a 62 y.o. 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  CT of the chest, abdomen, and pelvis on 12/24/2022 shows bilateral peripheral pulm nodules mildly increased in size with bilateral small pleural effusions. Patient has stable sclerotic metastasis in spine and pelvis with a lytic lesion in the right femoral neck concerning for possible impending pathologic fracture.  Patient underwent surgical pinning of the right femur on 01/01/2023.  She was hospitalized 01/12/2023 to 01/18/2023 for COPD exacerbation.  Palliative care was consulted to address goals and manage ongoing symptoms.    Observations/Objective: I called and spoke with patient by phone.  CTA of the chest on 01/17/2023 showed slight interval increase in size of pleural and parenchymal nodularity.  She was seen in the ED on 01/21/2023 with dyspnea.  She is now on home O2.  Patient saw Dr. [name] yesterday with plan to resume Lumakras.  Today, patient says she is doing okay.  She denies significant changes or concerns.  She says that she remains weak and would like to try PT to help improve her strength.  TOC notes reviewed and it does not appear that home health referral was sent at time  of discharge.  Patient reports stable pain.  Assessment and Plan: Stage IV non-small cell lung cancer -patient was restarted on second line Lumakras yesterday by Dr. [name].  Patient confirms goals are aligned with continued cancer treatments.  Weakness -patient would like referral for home health.  Unclear if insurance will cover this but will proceed with referral.  Neoplasm related pain -stable. continue oxycodone/OxyContin  Follow Up Instructions: Follow-up telephone visit 1 to 2 months   I discussed the assessment and treatment plan with the patient. The patient was provided an opportunity to ask questions and all were answered. The patient agreed with the plan and demonstrated an understanding of the instructions.   The patient was advised to call back or seek an in-person evaluation if the symptoms worsen or if the condition fails to improve as anticipated.  I provided 10 minutes of non-face-to-face time during this encounter.   [name], NP

## 2023-02-01 ENCOUNTER — Other Ambulatory Visit: Payer: Self-pay | Admitting: Oncology

## 2023-02-01 MED ORDER — BENZONATATE 100 MG PO CAPS: ORAL_CAPSULE | ORAL | 0 refills | Status: DC

## 2023-02-05 ENCOUNTER — Ambulatory Visit (INDEPENDENT_AMBULATORY_CARE_PROVIDER_SITE_OTHER): Payer: Medicaid Other | Admitting: Pulmonary Disease

## 2023-02-05 ENCOUNTER — Encounter: Payer: Self-pay | Admitting: Pulmonary Disease

## 2023-02-05 VITALS — BP 100/60 | HR 126 | Temp 97.6°F | Ht 71.0 in | Wt 120.0 lb

## 2023-02-05 DIAGNOSIS — R000 Tachycardia, unspecified: Secondary | ICD-10-CM

## 2023-02-05 DIAGNOSIS — R0602 Shortness of breath: Secondary | ICD-10-CM

## 2023-02-05 DIAGNOSIS — J449 Chronic obstructive pulmonary disease, unspecified: Secondary | ICD-10-CM | POA: Diagnosis not present

## 2023-02-05 DIAGNOSIS — J9611 Chronic respiratory failure with hypoxia: Secondary | ICD-10-CM | POA: Diagnosis not present

## 2023-02-05 DIAGNOSIS — R946 Abnormal results of thyroid function studies: Secondary | ICD-10-CM

## 2023-02-05 DIAGNOSIS — C3491 Malignant neoplasm of unspecified part of right bronchus or lung: Secondary | ICD-10-CM

## 2023-02-05 DIAGNOSIS — R64 Cachexia: Secondary | ICD-10-CM

## 2023-02-05 NOTE — Patient Instructions (Signed)
Keep using your oxygen at 2 L/min 24/7.  We will see you in follow-up in 4 to 6 weeks time call sooner should any new problems arise.

## 2023-02-05 NOTE — Progress Notes (Signed)
Subjective:    Patient ID: Brittney Fisher, female    DOB: [birth year], 62 y.o.   MRN: [id number] Patient Care Team: Pcp, No as PCP - General Telford Nab, RN as Oncology Nurse Navigator [name], MD as Medical Oncologist (Oncology)  Chief Complaint  Patient presents with   Consult    CT scan on 01/17/2023  SOB with exertion. No wheezing or cough.    HPI Patient is an unfortunate 62 year old former smoker with a 40-pack-year history of smoking and stage IV carcinoma of the right lung, who presents for evaluation of dyspnea for the last 5 to 6 months.  She is kindly referred by Altha Harm, NP.  She has extensive metastatic lung adenocarcinoma with malignant pleural effusion T4 in 3 and 1 currently on second line treatment with Lumakras.  He has extensive bony metastasis.  Recent CT chest 17 January 2023 showed extensive disease on the right, some new nodularity on the left, no pulmonary embolism.  I have independently reviewed the films and also showed the films to the patient and her daughter who is with her today.  The patient notes dyspnea when walking, talking or when she gets hot.  She notes that oxygen makes it better.  She has been diagnosed with COPD however there are no pulmonary function tests noted.  She is on adequate COPD management by her medication list which includes Advair, DuoNeb and as needed albuterol.  She claims compliance with all these medications.  He has not had any recent fevers, chills or sweats.  No cough or sputum production.  No wheezing noted.  She was placed on oxygen after an ED visit that showed oxygen saturations to drop as low as 86% with even the slightest of activity.  The patient unfortunately is too debilitated to even perform an ambulatory oximetry test today.  She was not wearing her oxygen initially during interview becoming more and more tachypneic.  Once oxygen was placed on her work of breathing improved and her dyspnea was alleviated.  She has not  had any chest pain.  She has had weight loss and anorexia.  She is trying to increase her protein intake.  She is cachectic.  Review of Systems A 10 point review of systems was performed and it is as noted above otherwise negative.  Past Medical History:  Diagnosis Date   COPD (chronic obstructive pulmonary disease) (HCC)    GERD (gastroesophageal reflux disease)    Metastatic lung cancer (metastasis from lung to other site) ([place]) 11/02/2020   Past Surgical History:  Procedure Laterality Date   COLONOSCOPY WITH ESOPHAGOGASTRODUODENOSCOPY (EGD)     COLONOSCOPY WITH PROPOFOL N/A 11/14/2015   Procedure: COLONOSCOPY WITH PROPOFOL;  Surgeon: Hulen Luster, MD;  Location: [facility] ENDOSCOPY;  Service: Gastroenterology;  Laterality: N/A;   ESOPHAGOGASTRODUODENOSCOPY (EGD) WITH PROPOFOL N/A 11/14/2015   Procedure: ESOPHAGOGASTRODUODENOSCOPY (EGD) WITH PROPOFOL;  Surgeon: Hulen Luster, MD;  Location: [facility] ENDOSCOPY;  Service: Gastroenterology;  Laterality: N/A;   FEMUR IM NAIL Right 01/01/2023   Procedure: TREATMENT, INTERTROCHANTERIC FEMORAL FRACTURE, WITH INTRAMEDULLARY IMPLANT (SURG);  Surgeon: Lovell Sheehan, MD;  Location: [facility] ORS;  Service: Orthopedics;  Laterality: Right;   Patient Active Problem List   Diagnosis Date Noted   Chronic respiratory failure with hypoxia ([place]) 01/28/2023   Palliative care encounter 01/16/2023   Pathological fracture of right femur ([place]) 01/16/2023   Hypokalemia 01/15/2023   Hypomagnesemia 01/13/2023   Anxiety 01/12/2023   Peripheral neuropathy 01/12/2023   Generalized abdominal  pain 01/12/2023   Hyponatremia 01/12/2023   Abnormal urinalysis 01/12/2023   COPD exacerbation ([place]) 01/11/2023   Impending pathologic fracture 01/01/2023   Insomnia 07/19/2022   Dysuria 06/22/2022   Malignant neoplasm metastatic to bone ([place]) 07/28/2021   Anemia 01/09/2021   Odynophagia 01/09/2021   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   Encounter for antineoplastic chemotherapy 11/25/2020    Tachycardia 11/25/2020   Neoplasm related pain 11/25/2020   Primary malignant neoplasm of lung metastatic to other site [facility]) 11/04/2020   Metastatic lung cancer (metastasis from lung to other site) ([place]) 11/02/2020   Weight loss 10/28/2020   Goals of care, counseling/discussion 10/28/2020   Malnutrition of moderate degree 10/22/2020   Pleural effusion 10/20/2020   Malignant pleural effusion    COPD with acute exacerbation (HCC)    Dizziness    Metastatic malignant neoplasm (HCC)    Shortness of breath    S/P thoracentesis    Lung mass    Family History  Problem Relation Age of Onset   Breast cancer Cousin 63   Diabetes type II Mother    Hypertension Mother    Colon cancer Father    Hypertension Father    Social History   Tobacco Use   Smoking status: Former    Packs/day: 1.00    Years: 40.00    Total pack years: 40.00    Types: Cigarettes    Quit date: 09/2021    Years since quitting: 1.3   Smokeless tobacco: Never  Substance Use Topics   Alcohol use: Never   Allergies  Allergen Reactions   Carboplatin Shortness Of Breath, Itching and Cough   Current Meds  Medication Sig   acetaminophen (TYLENOL) 500 MG tablet Take 1,000 mg by mouth every 8 (eight) hours as needed for moderate pain.    albuterol (VENTOLIN HFA) 108 (90 Base) MCG/ACT inhaler Inhale 2 puffs into the lungs every 6 (six) hours as needed for wheezing or shortness of breath.   benzonatate (TESSALON) 100 MG capsule Take 1 capsule by mouth three times daily as needed for cough   gabapentin (NEURONTIN) 100 MG capsule Take 1 capsule (100 mg total) by mouth 3 (three) times daily.   ipratropium-albuterol (DUONEB) 0.5-2.5 (3) MG/3ML SOLN Take 3 mLs by nebulization 3 (three) times daily.   LUMAKRAS 120 MG tablet TAKE 8 TABLETS BY MOUTH DAILY   naloxone (NARCAN) nasal spray 4 mg/0.1 mL For suspected overdose of narcotics.   omeprazole (PRILOSEC) 20 MG capsule Take 1 capsule (20 mg total) by mouth daily as needed.  Take Lumakras 4 hr before or 10 hr after taking omeprazole.   oxyCODONE (OXY IR/ROXICODONE) 5 MG immediate release tablet Take 1-2 tablets (5-10 mg total) by mouth every 4 (four) hours as needed for moderate pain or severe pain.   oxyCODONE (OXYCONTIN) 20 mg 12 hr tablet Take 1 tablet (20 mg total) by mouth every 12 (twelve) hours.   protein supplement shake (PREMIER PROTEIN) LIQD Take 11 oz by mouth daily.   psyllium (HYDROCIL/METAMUCIL) 95 % PACK Take 1 packet by mouth daily.   Immunization History  Administered Date(s) Administered   Influenza,inj,Quad PF,6+ Mos 10/20/2021, 11/01/2022       Objective:   Physical Exam BP 100/60 (BP Location: Left Arm, Cuff Size: Normal)   Pulse (!) 126   Temp 97.6 F (36.4 C)   Ht '5\' 11"'$  (1.803 m)   Wt 120 lb (54.4 kg) Comment: per patient. in a wheelchair and can not weigh  SpO2 96%  BMI 16.74 kg/m   SpO2: 96 % O2 Device: None (Room air)  GENERAL: Cachectic woman, presents in transport chair due to significant dyspnea, significant tachypnea.  Appears uncomfortable.  Mild conversational dyspnea.  Very frail appearing. HEAD: Normocephalic, atraumatic.  EYES: Pupils equal, round, reactive to light.  No scleral icterus.  MOUTH: Poor dentition, oral mucosa moist.  No thrush. NECK: Supple. No thyromegaly. Trachea midline. No JVD.  No adenopathy. PULMONARY: Good air entry bilaterally.  Coarse on the right.  No adventitious sounds on left. CARDIOVASCULAR: S1 and S2.  Tachycardic rate and regular rhythm.  No murmur. ABDOMEN: Scaphoid, otherwise benign. MUSCULOSKELETAL: No joint deformity, no clubbing, no edema.  NEUROLOGIC: No overt focal deficit. SKIN: Intact,warm,dry. PSYCH: Anxious.      Assessment & Plan:     ICD-10-CM   1. Adenocarcinoma of right lung (HCC)  C34.91    Stage IV Suspect most of her symptoms are related to advanced disease Recommend Palliative Care    2. Chronic respiratory failure with hypoxia (HCC)  J96.11    Not  performed ambulatory oximetry due to the patient's debilitated state Noted to desaturate previously to 86% during ED visit Recommend use and 2 L/min 24/7    3. COPD, severity to be determined ([place])  J44.9    Suspect severe Patient too debilitated to undergo PFTs Continue Advair and DuoNebs Continue as needed albuterol    4. Shortness of breath  R06.02    Multifactorial: Advanced stage cancer, COPD Debility, frailty, deconditioning Malnutrition    5. Tachycardia  R00.0    Normal thyroid function testing Recommend reassess May add to the sensation of dyspnea    6. Abnormal thyroid function test  R94.6    Will need reassessment    7. 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  She is too debilitated to be able to undergo PFTs.  She is tachypneic at baseline and very frail and debilitated appearing.  She was noted to have abnormal airway function and may have an element of hyperthyroidism, who recommend these be repeated as these may account for her tachycardia.  Recent CT angio chest on 1 February did not show PE. The patient appeared to have palliation of her dyspnea with O2 during her visit.  I recommend that she keep her oxygen at 2 L/min 24/7.  Will see the patient in follow-up in 4 to 6 weeks time call sooner should any new problems arise.  Renold Don, MD Advanced Bronchoscopy PCCM [facility]    *This note was dictated using voice recognition software/Dragon.  Despite best efforts to proofread, errors can occur which can change the meaning. Any transcriptional errors that result from this process are unintentional and may not be fully corrected at the time of dictation.

## 2023-02-06 ENCOUNTER — Encounter: Payer: Self-pay | Admitting: Pulmonary Disease

## 2023-02-07 ENCOUNTER — Inpatient Hospital Stay (HOSPITAL_BASED_OUTPATIENT_CLINIC_OR_DEPARTMENT_OTHER): Payer: Medicaid Other | Admitting: Hospice and Palliative Medicine

## 2023-02-07 ENCOUNTER — Inpatient Hospital Stay: Payer: Medicaid Other

## 2023-02-07 ENCOUNTER — Ambulatory Visit: Payer: Medicaid Other | Admitting: Oncology

## 2023-02-07 ENCOUNTER — Telehealth: Payer: Self-pay | Admitting: *Deleted

## 2023-02-07 ENCOUNTER — Other Ambulatory Visit: Payer: Self-pay

## 2023-02-07 VITALS — BP 109/57 | HR 117

## 2023-02-07 DIAGNOSIS — C3490 Malignant neoplasm of unspecified part of unspecified bronchus or lung: Secondary | ICD-10-CM

## 2023-02-07 DIAGNOSIS — R531 Weakness: Secondary | ICD-10-CM

## 2023-02-07 DIAGNOSIS — Z515 Encounter for palliative care: Secondary | ICD-10-CM

## 2023-02-07 DIAGNOSIS — G893 Neoplasm related pain (acute) (chronic): Secondary | ICD-10-CM | POA: Diagnosis not present

## 2023-02-07 DIAGNOSIS — C3411 Malignant neoplasm of upper lobe, right bronchus or lung: Secondary | ICD-10-CM | POA: Diagnosis not present

## 2023-02-07 MED ORDER — OXYCODONE HCL ER 10 MG PO T12A: 10.0000 mg | EXTENDED_RELEASE_TABLET | Freq: Two times a day (BID) | ORAL | 0 refills | Status: DC

## 2023-02-07 MED ORDER — DEXAMETHASONE 2 MG PO TABS: 2.0000 mg | ORAL_TABLET | Freq: Every day | ORAL | 0 refills | Status: DC

## 2023-02-07 MED ORDER — LORAZEPAM 0.5 MG PO TABS: 0.5000 mg | ORAL_TABLET | Freq: Three times a day (TID) | ORAL | 0 refills | Status: DC

## 2023-02-07 NOTE — Progress Notes (Signed)
Per Daughter - she has not heard anything from [facility] referral. I received a message from [place] at [facility] yesterday to confirm that the referral was forwarded to [place]. I called [facility] while in the patient's exam room. I had to leave a vm for Karen-in patient intake. I requested a call back to confirm that the referral was received. Patient and daughter updated with the above information.  Per patient- she would like to discuss pain medication management. She feels that the oxycontin 20 mg ER is too strong and contributing to intermittent delirium.

## 2023-02-07 NOTE — Progress Notes (Signed)
Symptom Management [place] [facility] [phone] [phone]  Patient Care Team: Pcp, No as PCP - General Telford Nab, RN as Oncology Nurse Navigator [name], MD as Medical Oncologist (Oncology)   NAME OF PATIENT: Brittney Fisher  [id number]  12-31-1960   DATE OF VISIT: 02/07/23  REASON FOR CONSULT: Brittney Fisher is a 62 y.o. 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   INTERVAL HISTORY: Patient returns to clinic today for follow-up.  Today, patient reports that she is unchanged.  She continues to endorse chronic shortness of breath and is now wearing her O2 around-the-clock.  She saw Dr. Patsey Berthold with pulmonary earlier this week.  Patient would like to reduce the dose of her OxyContin as she feels like that is causing anxiety.  Patient also requests to restart steroid for appetite/fatigue.  Denies any neurologic complaints. Denies recent fevers or illnesses. Denies any easy bleeding or bruising. Reports poor appetite. Denies chest pain. Denies any nausea, vomiting, constipation, or diarrhea. Denies urinary complaints. Patient offers no further specific complaints today.   PAST MEDICAL HISTORY: Past Medical History:  Diagnosis Date   COPD (chronic obstructive pulmonary disease) (HCC)    GERD (gastroesophageal reflux disease)    Metastatic lung cancer (metastasis from lung to other site) ([place]) 11/02/2020    PAST SURGICAL HISTORY:  Past Surgical History:  Procedure Laterality Date   COLONOSCOPY WITH ESOPHAGOGASTRODUODENOSCOPY (EGD)     COLONOSCOPY WITH PROPOFOL N/A 11/14/2015   Procedure: COLONOSCOPY WITH PROPOFOL;  Surgeon: Hulen Luster, MD;  Location: [facility] ENDOSCOPY;  Service: Gastroenterology;  Laterality: N/A;   ESOPHAGOGASTRODUODENOSCOPY (EGD) WITH PROPOFOL N/A  11/14/2015   Procedure: ESOPHAGOGASTRODUODENOSCOPY (EGD) WITH PROPOFOL;  Surgeon: Hulen Luster, MD;  Location: [facility] ENDOSCOPY;  Service: Gastroenterology;  Laterality: N/A;   FEMUR IM NAIL Right 01/01/2023   Procedure: TREATMENT, INTERTROCHANTERIC FEMORAL FRACTURE, WITH INTRAMEDULLARY IMPLANT (SURG);  Surgeon: Lovell Sheehan, MD;  Location: [facility] ORS;  Service: Orthopedics;  Laterality: Right;    HEMATOLOGY/ONCOLOGY HISTORY:  Oncology History  Metastatic lung cancer (metastasis from lung to other site) ([place])  11/02/2020 Initial Diagnosis   Metastatic lung cancer (metastasis from lung to other site) [facility])   11/18/2020 - 09/29/2021 Chemotherapy   Patient is on Treatment Plan : LUNG PEMEtrexed (Alimta) + CARBOplatin + Bevacizumab q21d x 1 cycle      03/20/2022 Imaging   CT chest abdomen pelvis w contrast 1. Unchanged circumferential pleural thickening and pleural nodularity of the right hemithorax, left infrahilar mass, and bilateral pulmonary nodules. 2. Stable right perihilar consolidation, likely post radiation changes. 3. Unchanged numerous sclerotic osseous lesions.4.  Aortic Atherosclerosis (ICD10-I70.0).    06/08/2022 Imaging   MRI lumbar spine w and wo 1. Unchanged appearance of multiple sclerotic lesions involving all lumbar levels, consistent with osseous metastatic disease.2. Severe right L5-S1 neural foraminal stenosis.3. Mild right L4-5 neural foraminal stenosis  MR Sacrum SI Joints w wo contrast 1. Multiple sclerotic lesions of sacrum and bilateral ilia with enhancement on post contrast sequences consistent with metastatic disease.2.  No evidence of sacroiliitis or joint effusion. 3.  No significant finding of the neurovascular bundle bilaterally     Imaging     Primary malignant neoplasm of lung metastatic to other site [facility])  11/04/2020 Initial Diagnosis   Primary malignant neoplasm of lung metastatic to other site [facility])  10/20/2020, patient  was brought to ED via EMS due to  generalized weakness, dizziness, chest pain shortness of breath.  She was recently diagnosed with COPD approximately 1 month ago by primary care provider.  Also unintentional weight loss during the last few months. Image work-up showed right-sided pleural effusion.  She underwent right thoracentesis.  With removal of 2.4 L of fluid. 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  Metastatic disease to low cervical/thoracic nodes, lung, right pleural space and bones.  Moderate to large right pleural effusion.  SVC narrowing. 10/21/2020 MRI brain is negative for metastasis.  Mild chronic microvascular ischemic changes in the white matter.  Negative for acute infarct Regarding to the SVC narrowing, patient was seen by vascular surgeon and was recommended no intervention inpatient.  Patient to follow-up outpatient with vascular surgeon for evaluation. Patient also was treated for COPD exacerbation with hypoxia on exertion. Qualifies for home oxygen and hospitalist arrange home health and a nebulizer.  Patient was given a course of prednisone taper and empiric Levaquin. Patient was discharged and present today to follow-up with cytology results and further management plan   10/27/2020 PET scan showed right upper lobe primary bronchogenic mass with direct invasion into mediastinum. Metastatic disease to the right pleural space, lung, bone, nodes of the chest and less so lower leg/upper abdomen. Hypermetabolic him along the course of the right axillary vein with concurrent subtle hyper attenuation within the axillary vein and SVC.  This continues to the level of SVC narrowing.  Suspicious for SVC occlusion and developing thrombus. Moderate right pleural effusion and small pericardial effusion.   # # left supraclavicular mass biopsy- pathology is positive for adenocarcinoma. positive for CK7, with patchy, weak CK20.  They are   negative for TTF1, NapsinA, GATA3, CDX2, and Pax8.  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  #NGS showed AXIN 1 [id number], DIS2 M134fs, KRAS G12C, PRDM1 M1 [id number]*, [id number], MS stable, TPS <1%      11/04/2020 Cancer Staging   Staging form: Lung, AJCC 8th Edition - Clinical stage from 11/04/2020: Stage IV (cT4, cN3, cM1) - Signed by [name], MD on 11/04/2020   11/14/2020 - 12/12/2020 Radiation Therapy   #Bronchial obstruction and SVC occlusion.  s/p palliative radiation   11/18/2020 - 03/22/2022 Chemotherapy   carboplatin/Alimta/bevacizumab x 6 cycles   Infusion reaction of carboplatin during cycle 6.  Carboplatin discontinued.   04/07/2021 Imaging   CT chest abdomen pelvis-partial response Mild decrease in size of right lung mass, mildly decreased mediastinal and hilar adenopathy.  Extensive right-sided pleural metastasis unchanged.  Similar appearance of diffuse bilateral pulmonary nodules.  Multifocal lytic sclerotic bone metastasis.  New superior endplate deformity of T6   04/12/2021 - 09/29/2021 Chemotherapy   04/12/2021 - 09/08/2021 bevacizumab and Alimta.  09/29/2021 extra cycle of maintenance bevacizumab and Alimta while waiting for approval of Lumakras   09/21/2021 Imaging    bone scan showed multifocal abnormal uptake including mid to distal shaft of right femur, left femoral neck and trochanter, left sacroiliac region, left ischium.  Focal activity at the proximal tibia on the left.  Multi focal heterogeneous bilateral rib activity.  Focal activity at the sternum.   09/27/2021 Imaging   CT chest abdomen pelvis with contrast showed progression of infrahilar left lower lobe  with a concerning clearly progressive masslike consolidation opacity.  Mild progression of small left pleural  effusion.  Similar experience of extensive irregular right-sided pleural disease.  Similar experience of bony metastasis.    10/20/2021 -  Chemotherapy   Started on Lumakras   01/09/2022 Imaging   CT chest abdomen pelvis with contrast showed no substantial interval changes in exam.  Stable disease   03/20/2022 -  Chemotherapy   CT chest abdomen pelvis with contrast showed unchanged circumferential pleural thickening/pleural nodularity of the right hemithorax, left infrahilar mass and bilateral pulmonary nodules.  Stable right perihilar consolidation likely postradiation changes.  Unchanged numerous sclerotic osseous lesions.  Aortic atherosclerosis.     06/08/2022 Imaging   MRI sacrum SI joint w wo contrast Showed 1. Multiple sclerotic lesions of sacrum and bilateral ilia with enhancement on post contrast sequences consistent with metastatic disease. 2.  No evidence of sacroiliitis or joint effusion.3.  No significant finding of the neurovascular bundle bilaterally    06/08/2022 Imaging   MRI lumbar spine w wo contrast 1. Unchanged appearance of multiple sclerotic lesions involving all lumbar levels, consistent with osseous metastatic disease.2. Severe right L5-S1 neural foraminal stenosis.3. Mild right L4-5 neural foraminal stenosis    07/26/2022 Imaging   CT chest abdomen pelvis with contrast 1. Redemonstrated post treatment appearance of the chest with masslike consolidation and fibrosis of the perihilar right lung , mass of the superior segment left lower lobe, as well as extensive bilateral pleural thickening and nodularity, greater on the right, and numerous bilateral pulmonary nodules. 2. Increased nodularity about the right lung base, consistent with worsened pleural metastatic disease. Other nodules unchanged. 3. Unchanged post treatment appearance of mediastinal and right hilar lymph nodes as well as epicardial lymph nodes about the left ventricular apex. 4. Slightly enlarged lytic  osseous metastatic lesion of the base of the right femoral neck measuring 2.4 x 2.2 cm, previously 2.2 x 1.8 cm. This is potentially at risk for pathologic fracture given location.5. Otherwise unchanged widespread sclerotic osseous metastatic disease throughout the axial and proximal appendicular skeleton. 6. No evidence of soft tissue metastatic disease to the abdomen or pelvis. 6. No evidence of soft tissue metastatic disease to the abdomen or pelvis.   12/24/2022 Imaging   CT chest abdomen pelvis w contrast  CHEST IMPRESSION:   1. Bilateral peripheral pleural nodules are mildly increased in size. 2. Bilateral small effusions. Stable consolidation at the LEFT lung Base  3. Stable ill-defined tissue in the mediastinum suggesting treated tumor infiltrate.   PELVIS IMPRESSION:   1. No evidence of metastatic disease in the abdomen pelvis. 2. Stable sclerotic metastasis in the spine and pelvis. 3. Stable lytic lesion in the RIGHT femoral neck may be at risk for pathologic fracture. 4.  Aortic Atherosclerosis (ICD10-I70.0)     ALLERGIES:  is allergic to carboplatin.  MEDICATIONS:  Current Outpatient Medications  Medication Sig Dispense Refill   acetaminophen (TYLENOL) 500 MG tablet Take 1,000 mg by mouth every 8 (eight) hours as needed for moderate pain.      albuterol (VENTOLIN HFA) 108 (90 Base) MCG/ACT inhaler Inhale 2 puffs into the lungs every 6 (six) hours as needed for wheezing or shortness of breath. 8 g 2   benzonatate (TESSALON) 100 MG capsule Take 1 capsule by mouth three times daily as needed for cough 60 capsule 0   fluticasone-salmeterol (ADVAIR DISKUS) 250-50 MCG/ACT AEPB Inhale 1 puff into the lungs in the morning and at bedtime. 60 each 0  folic acid (FOLVITE) 1 MG tablet Take 1 tablet (1 mg total) by mouth daily. Start 5-7 days before Alimta chemotherapy. Continue until 21 days after Alimta completed. (Patient not taking: Reported on 01/12/2023) 100 tablet 3   gabapentin  (NEURONTIN) 100 MG capsule Take 1 capsule (100 mg total) by mouth 3 (three) times daily. 90 capsule 0   ipratropium-albuterol (DUONEB) 0.5-2.5 (3) MG/3ML SOLN Take 3 mLs by nebulization 3 (three) times daily. 360 mL 1   LUMAKRAS 120 MG tablet TAKE 8 TABLETS BY MOUTH DAILY 240 tablet 2   naloxegol oxalate (MOVANTIK) 25 MG TABS tablet Take 1 tablet (25 mg total) by mouth daily. (Patient not taking: Reported on 01/28/2023) 30 tablet 0   naloxone (NARCAN) nasal spray 4 mg/0.1 mL For suspected overdose of narcotics. 1 each 0   omeprazole (PRILOSEC) 20 MG capsule Take 1 capsule (20 mg total) by mouth daily as needed. Take Lumakras 4 hr before or 10 hr after taking omeprazole. 30 capsule 0   oxyCODONE (OXY IR/ROXICODONE) 5 MG immediate release tablet Take 1-2 tablets (5-10 mg total) by mouth every 4 (four) hours as needed for moderate pain or severe pain. 120 tablet 0   oxyCODONE (OXYCONTIN) 20 mg 12 hr tablet Take 1 tablet (20 mg total) by mouth every 12 (twelve) hours. 90 tablet 0   protein supplement shake (PREMIER PROTEIN) LIQD Take 11 oz by mouth daily.     psyllium (HYDROCIL/METAMUCIL) 95 % PACK Take 1 packet by mouth daily. 30 packet 0   No current facility-administered medications for this visit.    VITAL SIGNS: There were no vitals taken for this visit. There were no vitals filed for this visit.  Estimated body mass index is 16.74 kg/m as calculated from the following:   Height as of 02/05/23: 5\' 11"  (1.803 m).   Weight as of 02/05/23: 120 lb (54.4 kg).  LABS: CBC:    Component Value Date/Time   WBC 9.3 01/28/2023 1441   HGB 11.7 (L) 01/28/2023 1441   HCT 36.1 01/28/2023 1441   PLT 196 01/28/2023 1441   MCV 85.1 01/28/2023 1441   NEUTROABS 8.3 (H) 01/28/2023 1441   LYMPHSABS 0.4 (L) 01/28/2023 1441   MONOABS 0.6 01/28/2023 1441   EOSABS 0.0 01/28/2023 1441   BASOSABS 0.0 01/28/2023 1441   Comprehensive Metabolic Panel:    Component Value Date/Time   NA 130 (L) 01/28/2023 1441    K 3.5 01/28/2023 1441   CL 92 (L) 01/28/2023 1441   CO2 27 01/28/2023 1441   BUN 17 01/28/2023 1441   CREATININE 0.61 01/28/2023 1441   GLUCOSE 116 (H) 01/28/2023 1441   CALCIUM 8.4 (L) 01/28/2023 1441   AST 19 01/28/2023 1441   ALT 12 01/28/2023 1441   ALKPHOS 74 01/28/2023 1441   BILITOT 0.5 01/28/2023 1441   PROT 6.8 01/28/2023 1441   ALBUMIN 2.9 (L) 01/28/2023 1441    RADIOGRAPHIC STUDIES: US Venous Img Lower Bilateral (DVT)  Result Date: 01/21/2023 CLINICAL DATA:  Shortness of breath. EXAM: BILATERAL LOWER EXTREMITY VENOUS DOPPLER ULTRASOUND TECHNIQUE: Gray-scale sonography with compression, as well as color and duplex ultrasound, were performed to evaluate the deep venous system(s) from the level of the common femoral vein through the popliteal and proximal calf veins. COMPARISON:  None Available. FINDINGS: VENOUS Normal compressibility of the common femoral, superficial femoral, and popliteal veins, as well as the visualized calf veins. Visualized portions of profunda femoral vein and great saphenous vein unremarkable. No filling defects to suggest DVT  on grayscale or color Doppler imaging. Doppler waveforms show normal direction of venous flow, normal respiratory plasticity and response to augmentation. OTHER None. Limitations: none IMPRESSION: No sonographic evidence of bilateral lower extremity DVT. Electronically Signed   By: Keith Rake M.D.   On: 01/21/2023 20:55   DG Chest 2 View  Result Date: 01/21/2023 CLINICAL DATA:  Shortness of breath with movement and walking, stage IV lung cancer EXAM: CHEST - 2 VIEW COMPARISON:  Earlier study of 01/21/2023 at 1033 hours FINDINGS: Normal heart size, mediastinal contours, and pulmonary vascularity. Changes of COPD with chronic opacity at RIGHT apex and perihilar region likely related to prior therapy. Persistent small bibasilar pleural effusions greater on RIGHT. Nodular foci in both lungs consistent with known metastatic disease. No  pneumothorax. Bones demineralized with chronic height loss of an upper thoracic vertebra unchanged. Scattered sclerotic foci consistent with osseous metastases in spine, RIGHT scapula, proximal RIGHT humerus. IMPRESSION: Post therapy changes in the RIGHT lung. Pulmonary metastatic disease with small bibasilar effusions greater on RIGHT. Osseous metastases. Emphysema (ICD10-J43.9). Electronically Signed   By: Lavonia Dana M.D.   On: 01/21/2023 17:16   DG Chest Portable 1 View  Result Date: 01/21/2023 CLINICAL DATA:  Shortness of breath on exertion. Stage IV lung cancer. EXAM: PORTABLE CHEST 1 VIEW COMPARISON:  01/15/2023 and CT chest 01/17/2023. FINDINGS: Trachea is midline. Heart size normal. Extensive post radiation in the upper right hemithorax and right perihilar region. Bilateral pleuroparenchymal nodularity, as on comparison CT. Small bilateral pleural effusions. Findings are unchanged from 01/15/2023. IMPRESSION: 1. Pulmonary metastatic disease. 2. Small bilateral pleural effusions. 3. Post radiation changes in the upper right hemithorax and right perihilar region. 4. No superimposed acute abnormality. Electronically Signed   By: Lorin Picket M.D.   On: 01/21/2023 10:46   CT Angio Chest Pulmonary Embolism (PE) W or WO Contrast  Result Date: 01/17/2023 CLINICAL DATA:  COPD, history of metastatic lung cancer, worsening dyspnea and cough EXAM: CT ANGIOGRAPHY CHEST WITH CONTRAST TECHNIQUE: Multidetector CT imaging of the chest was performed using the standard protocol during bolus administration of intravenous contrast. Multiplanar CT image reconstructions and MIPs were obtained to evaluate the vascular anatomy. RADIATION DOSE REDUCTION: This exam was performed according to the departmental dose-optimization program which includes automated exposure control, adjustment of the mA and/or kV according to patient size and/or use of iterative reconstruction technique. CONTRAST:  8mL OMNIPAQUE IOHEXOL 350 MG/ML  SOLN COMPARISON:  01/11/2023 FINDINGS: Cardiovascular: This is a technically adequate evaluation of the pulmonary vasculature. No filling defects or pulmonary emboli. The heart is unremarkable without pericardial effusion. No evidence of thoracic aortic aneurysm or dissection. Stable atherosclerosis of the aorta and coronary vasculature. 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 Mediastinum/Nodes: Loss of normal fat planes throughout the mediastinum may reflect post therapeutic change or sequela of metastatic disease. Discrete adenopathy or mass is difficult to measure, but the appearance is stable since the previous evaluation. Thyroid, trachea, and esophagus are grossly unremarkable. Lungs/Pleura: Chronic areas of consolidation within the bilateral lower lobes are again noted and unchanged. Extensive pleural nodularity again noted bilaterally, right greater than left. Chronic scarring and bronchovascular thickening within the right hilar region unchanged. Index nodules measured previously are as follows: Left upper lobe, image 50/6, 8 mm.  Previously 7 mm. Right lower lobe, image 103/6, 16 mm.  Previously 14 mm. Right middle lobe, image 103/6, 13 mm.  Previously  12 mm. Left upper lobe, image 99/6, 13 mm.  Previously 12 mm. Trace bilateral pleural effusions are again noted.  No pneumothorax. Upper Abdomen: No acute abnormality. Musculoskeletal: Stable sclerotic metastatic disease within the bilateral scapula, sternum, and thoracic spine. There are no acute displaced fractures. Reconstructed images demonstrate no additional findings. Review of the MIP images confirms the above findings. IMPRESSION: 1. No evidence of pulmonary embolus. 2. Findings consistent with known metastatic lung cancer, with slight interval increase in size of the pleural and parenchymal nodularity seen previously. 3.  Stable diffuse sclerotic bony metastases. 4.  Aortic Atherosclerosis (ICD10-I70.0). Electronically Signed   By: Randa Ngo M.D.   On: 01/17/2023 16:37   ECHOCARDIOGRAM COMPLETE  Result Date: 01/16/2023    ECHOCARDIOGRAM REPORT   Patient Name:   MIYO AINA Date of Exam: 01/16/2023 Medical Rec #:  [id number]        Height:       71.0 in Accession #:    [id number]       Weight:       133.8 lb Date of Birth:  May 13, 1961        BSA:          1.778 m Patient Age:    53 years         BP:           129/71 mmHg Patient Gender: F                HR:           110 bpm. Exam Location:  [facility] Procedure: 2D Echo, Color Doppler and Cardiac Doppler Indications:    Acute respiratory distress R06.03  History:        Patient has no prior history of Echocardiogram examinations.                 COPD.  Sonographer:    Sherrie Sport Referring Phys: [id number] [facility] Diagnosing      Kate Sable MD Phys:  Sonographer Comments: Image acquisition challenging due to patient body habitus. IMPRESSIONS  1. Left ventricular ejection fraction, by estimation, is 60 to 65%. The left ventricle has normal function. The left ventricle has no regional wall motion abnormalities. Left ventricular diastolic parameters are consistent with Grade I diastolic dysfunction (impaired relaxation).  2. Right ventricular systolic function is normal. The right ventricular size is normal.  3. The mitral valve is normal in structure. No evidence of mitral valve regurgitation.  4. The aortic valve is tricuspid. Aortic valve regurgitation is not visualized. FINDINGS  Left Ventricle: Left ventricular ejection fraction, by estimation, is 60 to 65%. The left ventricle has normal function. The left ventricle has no regional wall motion abnormalities. The left ventricular internal cavity size was normal in size. There is  no left ventricular hypertrophy. Left ventricular diastolic parameters are consistent with Grade I diastolic dysfunction (impaired  relaxation). Right Ventricle: The right ventricular size is normal. No increase in right ventricular wall thickness. Right ventricular systolic function is normal. Left Atrium: Left atrial size was normal in size. Right Atrium: Right atrial size was normal in size. Pericardium: There is no evidence of pericardial effusion. Mitral Valve: The mitral valve is normal in structure. No evidence of mitral valve regurgitation. Tricuspid Valve: The tricuspid valve is normal in structure. Tricuspid valve regurgitation is not demonstrated. Aortic Valve: The aortic valve is tricuspid. Aortic valve regurgitation is not visualized. Aortic valve mean gradient measures 3.0 mmHg. Aortic valve peak gradient  measures 4.8 mmHg. Aortic valve area, by VTI measures 2.30 cm. Pulmonic Valve: The pulmonic valve was normal in structure. Pulmonic valve regurgitation is not visualized. Aorta: The aortic root is normal in size and structure. Venous: The inferior vena cava was not well visualized. IAS/Shunts: No atrial level shunt detected by color flow Doppler.  LEFT VENTRICLE PLAX 2D LVIDd:         3.20 cm   Diastology LVIDs:         1.90 cm   LV e' medial:    7.07 cm/s LV PW:         0.90 cm   LV E/e' medial:  9.1 LV IVS:        1.10 cm   LV e' lateral:   9.25 cm/s LVOT diam:     1.90 cm   LV E/e' lateral: 7.0 LV SV:         37 LV SV Index:   21 LVOT Area:     2.84 cm  RIGHT VENTRICLE RV Basal diam:  3.60 cm RV Mid diam:    2.70 cm RV S prime:     18.40 cm/s TAPSE (M-mode): 2.1 cm LEFT ATRIUM           Index       RIGHT ATRIUM          Index LA diam:      1.50 cm 0.84 cm/m  RA Area:     9.50 cm LA Vol (A2C): 6.9 ml  3.88 ml/m  RA Volume:   17.30 ml 9.73 ml/m LA Vol (A4C): 10.0 ml 5.63 ml/m  AORTIC VALVE AV Area (Vmax):    2.35 cm AV Area (Vmean):   2.05 cm AV Area (VTI):     2.30 cm AV Vmax:           109.00 cm/s AV Vmean:          76.900 cm/s AV VTI:            0.160 m AV Peak Grad:      4.8 mmHg AV Mean Grad:      3.0 mmHg LVOT  Vmax:         90.20 cm/s LVOT Vmean:        55.600 cm/s LVOT VTI:          0.130 m LVOT/AV VTI ratio: 0.81  AORTA Ao Root diam: 3.20 cm MITRAL VALVE               TRICUSPID VALVE MV Area (PHT): 6.90 cm    TR Peak grad:   31.6 mmHg MV Decel Time: 110 msec    TR Vmax:        281.00 cm/s MV E velocity: 64.50 cm/s MV A velocity: 96.90 cm/s  SHUNTS MV E/A ratio:  0.67        Systemic VTI:  0.13 m                            Systemic Diam: 1.90 cm Kate Sable MD Electronically signed by Kate Sable MD Signature Date/Time: 01/16/2023/4:29:24 PM    Final    DG Chest Port 1 View  Result Date: 01/15/2023 CLINICAL DATA:  Shortness of breath.  COPD.  Metastatic lung cancer. EXAM: PORTABLE CHEST 1 VIEW COMPARISON:  01/11/2023 FINDINGS: Heart size remains normal. Chronic volume loss and scarring in the right upper chest remains stable. Chronic bilateral pleural blunting remains stable. Chronic  volume loss/infiltrate in both lower lobes. Compared to the study of 4 days ago, findings appear quite similar. No new process is identified. IMPRESSION: No significant change since the study of 4 days ago. Chronic volume loss and scarring in the right upper chest. 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 Electronically Signed   By: Nelson Chimes M.D.   On: 01/15/2023 08:07   US Abdomen Limited RUQ (LIVER/GB)  Result Date: 01/12/2023 CLINICAL DATA:  62 year old female with history of abdominal pain. EXAM: ULTRASOUND ABDOMEN LIMITED RIGHT UPPER QUADRANT COMPARISON:  No priors. FINDINGS: Gallbladder: Gallbladder is moderately distended. No evidence of gallstones or biliary sludge. Gallbladder wall thickness is normal. No pericholecystic fluid. Per report from the sonographer, there was no sonographic Murphy's sign on examination. Common bile duct: Diameter: 3.4 mm. Liver: No focal lesion identified. Within normal limits in parenchymal echogenicity. Portal vein is patent on color  Doppler imaging with normal direction of blood flow towards the liver. Other: Incidental imaging of the right kidney demonstrates an anechoic lesion with increased through transmission in the interpolar region, compatible with a large parapelvic cyst, estimated to measure approximately 4.3 x 4.0 x 2.8 cm (no imaging follow-up recommended). IMPRESSION: 1. No acute findings. Specifically, no gallstones and no evidence of acute cholecystitis. Electronically Signed   By: Vinnie Langton M.D.   On: 01/12/2023 05:42   CT Angio Chest PE W and/or Wo Contrast  Result Date: 01/11/2023 CLINICAL DATA:  Pulmonary embolism (PE) suspected, high prob Patient with history of metastatic lung cancer. EXAM: CT ANGIOGRAPHY CHEST WITH CONTRAST TECHNIQUE: Multidetector CT imaging of the chest was performed using the standard protocol during bolus administration of intravenous contrast. Multiplanar CT image reconstructions and MIPs were obtained to evaluate the vascular anatomy. RADIATION DOSE REDUCTION: This exam was performed according to the departmental dose-optimization program which includes automated exposure control, adjustment of the mA and/or kV according to patient size and/or use of iterative reconstruction technique. CONTRAST:  47mL OMNIPAQUE IOHEXOL 350 MG/ML SOLN COMPARISON:  Radiograph earlier today. Staging chest CT 12/24/2022 FINDINGS: Cardiovascular: There are no filling defects within the pulmonary arteries to suggest pulmonary embolus. Aortic atherosclerosis and tortuosity. No acute aortic findings. Stable heart size. Trace pericardial effusion. Bilateral anterior chest wall collaterals, however no evidence of SVC occlusion. Mediastinum/Nodes: Ill-defined soft tissue planes within the mediastinum again seen without significant interval change. Ill-defined soft tissue density seen anterior to the trachea, at the level of the carina and adjacent to the right mainstem bronchus. 14 mm central left hilar lymph node  versus pulmonary nodule. Patulous esophagus. Lungs/Pleura: Again seen pleural thickening the right lung apex, near circumferential throughout the right hemithorax with areas of pleural nodularity. Areas of peripheral pleural based nodularity in the right hemithorax are again seen. Representative right lower lobe pleural base nodule measures 15 x 12 mm series 7, image 97, unchanged by my retrospective measurement. Peri diaphragmatic nodule in the right lung base measures 14 mm series 7, image 111, also unchanged. Pleural based right middle lobe nodule measures 11 mm series 7, image 109, unchanged. Interstitial nodularity throughout the right lower lobe with bronchial thickening is stable. Ill-defined soft tissue density in the central right perihilar lung is unchanged. Small right pleural effusion. Consolidation in the left lower lobe, most prominent centrally, with heterogeneous parenchyma, stable from prior. Subpleural left upper lobe nodule measures 12 mm series 7, image 102, previously 10 mm. Subpleural left upper lobe nodule measuring 7 mm series 7, image 58,  previously 6 mm. Similar left pleural thickening with small left pleural effusion. No definite acute airspace disease. Upper Abdomen: Assessed on abdominopelvic CT earlier today. Musculoskeletal: Multifocal sclerotic osseous lesions typical of metastatic disease. Exaggerated upper thoracic kyphosis. Unchanged upper thoracic mild compression deformities. No new osseous findings. Review of the MIP images confirms the above findings. IMPRESSION: 1. No pulmonary embolus. 2. History of known lung cancer, with stable appearance of the chest from earlier this month. Recommend continued oncologic follow-up per protocol. 3. Ill-defined soft tissue planes within the mediastinum and central right lung are unchanged from prior exam. 4. Stable osseous metastatic disease. Aortic Atherosclerosis (ICD10-I70.0). Electronically Signed   By: Keith Rake M.D.   On:  01/11/2023 21:21   CT ABDOMEN PELVIS WO CONTRAST  Result Date: 01/11/2023 CLINICAL DATA:  Left lower quadrant abdominal pain. 2 days. History of non-small-cell lung cancer. EXAM: CT ABDOMEN AND PELVIS WITHOUT CONTRAST TECHNIQUE: Multidetector CT imaging of the abdomen and pelvis was performed following the standard protocol without IV contrast. RADIATION DOSE REDUCTION: This exam was performed according to the departmental dose-optimization program which includes automated exposure control, adjustment of the mA and/or kV according to patient size and/or use of iterative reconstruction technique. COMPARISON:  CT 12/24/2022. older CT sidewall. X-ray 01/11/2023 earlier FINDINGS: Lower chest: Once again at the lung bases are pleural effusions, pleural thickening and masslike areas with reticulonodular changes, similar to the prior examination. Please correlate with prior chest CT and clinical history of lung cancer. Pericardial effusion is also seen with some enlarged pre cardiophrenic lymph nodes Hepatobiliary: On this non IV contrast exam, the liver is grossly preserved. The gallbladder is distended but unchanged from prior. Pancreas: Grossly the pancreas has preserved parenchyma. Spleen: Spleen is nonenlarged. Adrenals/Urinary Tract: Stable mild thickening of the adrenal glands. Bilateral Bosniak 1 renal cysts are stable. No specific imaging follow-up of the cysts in the kidneys. No renal or ureteral stones clearly seen. Contracted urinary bladder. Stomach/Bowel: Stomach is nondilated. There is some luminal air and fluid. There is some high attenuation seen along the course of the colon with scattered stool. The colon is nondilated. The cecum is in the low right hemipelvis. Very redundant course to loops of colon. Normal appendix identified posterior cecum in the right hemipelvis as seen on coronal series 4, image 39. No clear adjacent inflammatory stranding along the colon. Small bowel is nondilated.  Vascular/Lymphatic: Diffuse vascular calcifications along the aorta and branch vessels. Grossly preserved caliber IVC. Reproductive: Uterus and bilateral adnexa are unremarkable. There are some calcifications along the uterus, possible calcified fibroids. Other: Anasarca.  No clear ascites. Musculoskeletal: Scattered sclerotic bone metastases are identified. Please correlate with prior workup. Interval placement of dynamic right hip screw with acute surgical changes. Lytic lesion along the right femoral neck is again seen. Stranding. IMPRESSION: Study limited without the advantage of contrast and lack of intra-abdominal fat. No bowel obstruction, free air or free fluid. Diffuse scattered colonic stool. Normal appendix. Anasarca. Interval placement of a dynamic right hip screw transfixing the lucent lesion in the femoral neck. There is sclerotic bone metastases elsewhere as per prior history. Distended gallbladder but unchanged from previous. If there is concern of acute gallbladder pathology ultrasound may be useful. Extensive abnormality along the lung bases consistent with known history of lung cancer. Please correlate with recent workup. Electronically Signed   By: Jill Side M.D.   On: 01/11/2023 19:32   DG Chest Portable 1 View  Result Date: 01/11/2023 CLINICAL DATA:  Shortness  of breath. Cancer. Evaluate for effusion or edema. EXAM: PORTABLE CHEST 1 VIEW COMPARISON:  Radiograph 12/21/2022. CT 12/24/2022 FINDINGS: Chronic volume loss in the right hemithorax. Chronic right apical opacity, ill-defined right basilar opacities and right basilar effusion. Left infrahilar opacity and small left effusion are similar to prior CT. No pulmonary edema. Stable heart size and mediastinal contours. No pneumothorax. Scattered sclerotic lesions are better appreciated on prior CT. IMPRESSION: 1. Stable radiographic appearance of the chest. Stable right lung volume loss with apical and basilar opacity and right pleural  effusion from earlier this month. 2. Stable left basilar opacity and small left pleural effusion. 3. No pulmonary edema. Electronically Signed   By: Keith Rake M.D.   On: 01/11/2023 19:19   DG Abdomen 1 View  Result Date: 01/11/2023 CLINICAL DATA:  Constipation. Patient reports abdominal pain. EXAM: ABDOMEN - 1 VIEW COMPARISON:  CT 12/24/2022 FINDINGS: Added views of the abdomen obtained. No bowel dilatation to suggest obstruction. Moderate volume of stool in the colon, mild gaseous distension of splenic flexure. No radiopaque calculi or abnormal soft tissue calcifications. Surgical hardware in the right proximal femur is partially visualized. Stable sclerotic densities within the left proximal femur. Sclerotic lesions within the bony pelvis are only faintly visualized by radiograph. Known sclerotic metastatic disease. IMPRESSION: Nonobstructive bowel gas pattern. Moderate volume of colonic stool. Electronically Signed   By: Keith Rake M.D.   On: 01/11/2023 18:10    PERFORMANCE STATUS (ECOG) : 1 - Symptomatic but completely ambulatory  Review of Systems Unless otherwise noted, a complete review of systems is negative.  Physical Exam General: NAD Cardiovascular: regular rate and rhythm Pulmonary: some wheezing, diminished L side Abdomen: soft, nontender, + bowel sounds GU: no suprapubic tenderness Extremities: no edema, no joint deformities Skin: no rashes Neurological: Weakness but otherwise nonfocal  IMPRESSION/PLAN: Stage IV NSCLC -patient on second line Lumakras.  February CT showed slight progression.  There are other alternative treatment lines available if needed such as chemo/immunotherapy.  Discussed with Dr. [name] who sees patient next month.  Shortness of breath -chronic secondary to lung cancer/COPD.  Patient is now wearing O2 around-the-clock and feels like that has improved her symptoms.  Patient does endorse some anxiety at times, which exacerbates her dyspnea.  Will  start her on as needed lorazepam.  Neoplasm related pain -reasonably well-controlled on OxyContin with as needed oxycodone IR.  Patient would like to dose reduce her OxyContin as she feels like this is contributing to anxiety.  She states she is also seen occasional visual hallucinations and attributes it to this drug.  Will dose reduce OxyContin to 10 mg every 12 hours.  Hyperthyroidism -Free T4 slightly elevated.  Discussed with Dr. [name] who would like to follow labs for now.  Weakness -Home health pending  Weight loss -weight down 10 pounds in the past month.  Patient sees nutrition today.  Patient would like to trial dexamethasone again to see if that helps improve her appetite.  Case and plan discussed with Dr. [name].  Patient expressed understanding and was in agreement with this plan. She also understands that She can call clinic at any time with any questions, concerns, or complaints.   Thank you for allowing me to participate in the care of this very pleasant patient.   Time Total: 25 minutes  Visit consisted of counseling and education dealing with the complex and emotionally intense issues of symptom management in the setting of serious illness.Greater than 50%  of this time was  spent counseling and coordinating care related to the above assessment and plan.  Signed by: Altha Harm, PhD, NP-C

## 2023-02-07 NOTE — Progress Notes (Signed)
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  Patient receiving lumakras.    Met with patient and niece following clinic visit.  Patient tired.  Reports decreased appetite. Eats small meals during the day.  Verbalized that she wants to restart steroids because they increased her appetite.  NP aware.  Drinks premier protein shakes but not daily.     Medications: decadron  Labs: reviewed  Anthropometrics:   Weight 120 lb on 2/20  134 lb 14.4 oz on 12/11/22  10% weight loss in the last year 156 lb on 12/12/21    NUTRITION DIAGNOSIS: Inadequate oral intake continues   INTERVENTION:  Recommend trial of 350 calories or higher shake. Samples of boost VHC, ensure complete provided today and boost plus.   Encouraged high calorie, high protein foods. Handout provided    MONITORING, EVALUATION, GOAL: weight trends, intake   NEXT VISIT: Thursday, March 21 after MD visit  Sheridan Gettel B. [name], [place], [employer] Registered Dietitian [phone]

## 2023-02-07 NOTE — Telephone Encounter (Signed)
I received a message from Santiago Glad at [facility]. I returned phone call to Santiago Glad [phone]. Unfortunately, the [facility] referral was declined. Due to high acuity - the [facility] agency does not have any PT staff that is available to service this patient.  Josh- do you think we can send a referral to [facility] given the [facility] pt is not available?

## 2023-02-08 ENCOUNTER — Encounter: Payer: Self-pay | Admitting: *Deleted

## 2023-02-08 NOTE — Telephone Encounter (Signed)
Mychart msg sent to patient re: referral to [facility]

## 2023-02-11 ENCOUNTER — Encounter: Payer: Self-pay | Admitting: Oncology

## 2023-02-11 ENCOUNTER — Ambulatory Visit: Payer: Medicaid Other

## 2023-02-11 ENCOUNTER — Encounter: Payer: Medicaid Other | Admitting: Hospice and Palliative Medicine

## 2023-02-11 ENCOUNTER — Other Ambulatory Visit: Payer: Medicaid Other

## 2023-02-11 ENCOUNTER — Telehealth: Payer: Self-pay | Admitting: *Deleted

## 2023-02-11 DIAGNOSIS — C3490 Malignant neoplasm of unspecified part of unspecified bronchus or lung: Secondary | ICD-10-CM

## 2023-02-11 NOTE — Telephone Encounter (Signed)
Per niece patient requesting to come in for IV fluids tomorrow. She reports that patient is not eating or drinking much. She denies dizziness. Please advise and let niece know time she can come in

## 2023-02-11 NOTE — Telephone Encounter (Signed)
Please schedule patient for labs/smc/+/- fluids for tomorrow. Please notify patient of appt. Thanks

## 2023-02-12 ENCOUNTER — Inpatient Hospital Stay: Payer: Medicaid Other

## 2023-02-12 ENCOUNTER — Inpatient Hospital Stay (HOSPITAL_BASED_OUTPATIENT_CLINIC_OR_DEPARTMENT_OTHER): Payer: Medicaid Other | Admitting: Hospice and Palliative Medicine

## 2023-02-12 VITALS — BP 111/61 | HR 99

## 2023-02-12 VITALS — BP 96/67 | HR 111 | Temp 96.7°F | Resp 22 | Ht 71.0 in | Wt 119.5 lb

## 2023-02-12 DIAGNOSIS — C3490 Malignant neoplasm of unspecified part of unspecified bronchus or lung: Secondary | ICD-10-CM

## 2023-02-12 DIAGNOSIS — C3411 Malignant neoplasm of upper lobe, right bronchus or lung: Secondary | ICD-10-CM | POA: Diagnosis not present

## 2023-02-12 DIAGNOSIS — G893 Neoplasm related pain (acute) (chronic): Secondary | ICD-10-CM | POA: Diagnosis not present

## 2023-02-12 LAB — CBC WITH DIFFERENTIAL (CANCER CENTER ONLY)
Abs Immature Granulocytes: 0.02 10*3/uL (ref 0.00–0.07)
Basophils Absolute: 0 10*3/uL (ref 0.0–0.1)
Basophils Relative: 0 %
Eosinophils Absolute: 0 10*3/uL (ref 0.0–0.5)
Eosinophils Relative: 0 %
HCT: 36 % (ref 36.0–46.0)
Hemoglobin: 11.4 g/dL — ABNORMAL LOW (ref 12.0–15.0)
Immature Granulocytes: 0 %
Lymphocytes Relative: 9 %
Lymphs Abs: 0.4 10*3/uL — ABNORMAL LOW (ref 0.7–4.0)
MCH: 27.7 pg (ref 26.0–34.0)
MCHC: 31.7 g/dL (ref 30.0–36.0)
MCV: 87.4 fL (ref 80.0–100.0)
Monocytes Absolute: 0.5 10*3/uL (ref 0.1–1.0)
Monocytes Relative: 11 %
Neutro Abs: 3.6 10*3/uL (ref 1.7–7.7)
Neutrophils Relative %: 80 %
Platelet Count: 194 10*3/uL (ref 150–400)
RBC: 4.12 MIL/uL (ref 3.87–5.11)
RDW: 14.9 % (ref 11.5–15.5)
WBC Count: 4.6 10*3/uL (ref 4.0–10.5)
nRBC: 0 % (ref 0.0–0.2)

## 2023-02-12 LAB — CMP (CANCER CENTER ONLY)
ALT: 13 U/L (ref 0–44)
AST: 21 U/L (ref 15–41)
Albumin: 2.9 g/dL — ABNORMAL LOW (ref 3.5–5.0)
Alkaline Phosphatase: 183 U/L — ABNORMAL HIGH (ref 38–126)
Anion gap: 10 (ref 5–15)
BUN: 11 mg/dL (ref 8–23)
CO2: 28 mmol/L (ref 22–32)
Calcium: 8.5 mg/dL — ABNORMAL LOW (ref 8.9–10.3)
Chloride: 95 mmol/L — ABNORMAL LOW (ref 98–111)
Creatinine: 0.52 mg/dL (ref 0.44–1.00)
GFR, Estimated: >60 mL/min (ref >60)
Glucose, Bld: 114 mg/dL — ABNORMAL HIGH (ref 70–99)
Potassium: 3.5 mmol/L (ref 3.5–5.1)
Sodium: 133 mmol/L — ABNORMAL LOW (ref 135–145)
Total Bilirubin: 0.4 mg/dL (ref 0.3–1.2)
Total Protein: 6.6 g/dL (ref 6.5–8.1)

## 2023-02-12 MED ORDER — SODIUM CHLORIDE 0.9 % IV SOLN
INTRAVENOUS | Status: DC
  Filled 2023-02-12 (×2): qty 250

## 2023-02-12 NOTE — Progress Notes (Signed)
Symptom Management [place] at [facility] [phone]) [phone] [phone]  Patient Care Team: Pcp, No as PCP - General Telford Nab, RN as Oncology Nurse Navigator [name], MD as Medical Oncologist (Oncology)   NAME OF PATIENT: Brittney Fisher  [id number]  [birth year]   DATE OF VISIT: 02/12/23  REASON FOR CONSULT: Brittney Fisher is a 62 y.o. 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   INTERVAL HISTORY: Patient returns to clinic today for follow-up.  Today, patient reports feeling weak.  She admits to poor fluid intake but feels like she is eating adequate amount of food.  She denies fever or chills.  No changes in shortness of breath.  Pain is reportedly stable.  Patient feels like she is doing better on lower dose of OxyContin.  Denies any neurologic complaints. Denies recent fevers or illnesses. Denies any easy bleeding or bruising. Reports poor appetite. Denies chest pain. Denies any nausea, vomiting, constipation, or diarrhea. Denies urinary complaints. Patient offers no further specific complaints today.   PAST MEDICAL HISTORY: Past Medical History:  Diagnosis Date   COPD (chronic obstructive pulmonary disease) (HCC)    GERD (gastroesophageal reflux disease)    Metastatic lung cancer (metastasis from lung to other site) ([place]) 11/02/2020    PAST SURGICAL HISTORY:  Past Surgical History:  Procedure Laterality Date   COLONOSCOPY WITH ESOPHAGOGASTRODUODENOSCOPY (EGD)     COLONOSCOPY WITH PROPOFOL N/A 11/14/2015   Procedure: COLONOSCOPY WITH PROPOFOL;  Surgeon: Hulen Luster, MD;  Location: [facility] ENDOSCOPY;  Service: Gastroenterology;  Laterality: N/A;   ESOPHAGOGASTRODUODENOSCOPY (EGD) WITH PROPOFOL N/A 11/14/2015   Procedure: ESOPHAGOGASTRODUODENOSCOPY (EGD) WITH PROPOFOL;  Surgeon:  Hulen Luster, MD;  Location: [facility] ENDOSCOPY;  Service: Gastroenterology;  Laterality: N/A;   FEMUR IM NAIL Right 01/01/2023   Procedure: TREATMENT, INTERTROCHANTERIC FEMORAL FRACTURE, WITH INTRAMEDULLARY IMPLANT (SURG);  Surgeon: Lovell Sheehan, MD;  Location: [facility] ORS;  Service: Orthopedics;  Laterality: Right;    HEMATOLOGY/ONCOLOGY HISTORY:  Oncology History  Metastatic lung cancer (metastasis from lung to other site) ([place])  11/02/2020 Initial Diagnosis   Metastatic lung cancer (metastasis from lung to other site) [facility])   11/18/2020 - 09/29/2021 Chemotherapy   Patient is on Treatment Plan : LUNG PEMEtrexed (Alimta) + CARBOplatin + Bevacizumab q21d x 1 cycle      03/20/2022 Imaging   CT chest abdomen pelvis w contrast 1. Unchanged circumferential pleural thickening and pleural nodularity of the right hemithorax, left infrahilar mass, and bilateral pulmonary nodules. 2. Stable right perihilar consolidation, likely post radiation changes. 3. Unchanged numerous sclerotic osseous lesions.4.  Aortic Atherosclerosis (ICD10-I70.0).    06/08/2022 Imaging   MRI lumbar spine w and wo 1. Unchanged appearance of multiple sclerotic lesions involving all lumbar levels, consistent with osseous metastatic disease.2. Severe right L5-S1 neural foraminal stenosis.3. Mild right L4-5 neural foraminal stenosis  MR Sacrum SI Joints w wo contrast 1. Multiple sclerotic lesions of sacrum and bilateral ilia with enhancement on post contrast sequences consistent with metastatic disease.2.  No evidence of sacroiliitis or joint effusion. 3.  No significant finding of the neurovascular bundle bilaterally     Imaging     Primary malignant neoplasm of lung metastatic to other site [facility])  11/04/2020 Initial Diagnosis   Primary malignant neoplasm of lung metastatic to other site [facility])  10/20/2020, patient was brought to ED via EMS due to  generalized weakness, dizziness, chest pain shortness of breath.  She was recently  diagnosed with COPD approximately 1 month ago by primary care provider.  Also unintentional weight loss during the last few months. Image work-up showed right-sided pleural effusion.  She underwent right thoracentesis.  With removal of 2.4 L of fluid. 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  Metastatic disease to low cervical/thoracic nodes, lung, right pleural space and bones.  Moderate to large right pleural effusion.  SVC narrowing. 10/21/2020 MRI brain is negative for metastasis.  Mild chronic microvascular ischemic changes in the white matter.  Negative for acute infarct Regarding to the SVC narrowing, patient was seen by vascular surgeon and was recommended no intervention inpatient.  Patient to follow-up outpatient with vascular surgeon for evaluation. Patient also was treated for COPD exacerbation with hypoxia on exertion. Qualifies for home oxygen and hospitalist arrange home health and a nebulizer.  Patient was given a course of prednisone taper and empiric Levaquin. Patient was discharged and present today to follow-up with cytology results and further management plan   10/27/2020 PET scan showed right upper lobe primary bronchogenic mass with direct invasion into mediastinum. Metastatic disease to the right pleural space, lung, bone, nodes of the chest and less so lower leg/upper abdomen. Hypermetabolic him along the course of the right axillary vein with concurrent subtle hyper attenuation within the axillary vein and SVC.  This continues to the level of SVC narrowing.  Suspicious for SVC occlusion and developing thrombus. Moderate right pleural effusion and small pericardial effusion.   # # left supraclavicular mass biopsy- pathology is positive for adenocarcinoma. positive for CK7, with patchy, weak CK20.  They are  negative for TTF1, NapsinA, GATA3, CDX2, and Pax8.  The findings are  nonspecific;  but may be compatible with a poorly differentiated  adenocarcinoma of lung origin, especially given the imaging findings   She also had another repeat thoracentesis and fluid cytology showed malignancy.  #NGS showed AXIN 1 [id number], DIS2 M128f, KRAS G12C, PRDM1 M1 [id number], [id number] MS stable, TPS <1%      11/04/2020 Cancer Staging   Staging form: Lung, AJCC 8th Edition - Clinical stage from 11/04/2020: Stage IV (cT4, cN3, cM1) - Signed by YEarlie Server MD on 11/04/2020   11/14/2020 - 12/12/2020 Radiation Therapy   #Bronchial obstruction and SVC occlusion.  s/p palliative radiation   11/18/2020 - 03/22/2022 Chemotherapy   carboplatin/Alimta/bevacizumab x 6 cycles   Infusion reaction of carboplatin during cycle 6.  Carboplatin discontinued.   04/07/2021 Imaging   CT chest abdomen pelvis-partial response Mild decrease in size of right lung mass, mildly decreased mediastinal and hilar adenopathy.  Extensive right-sided pleural metastasis unchanged.  Similar appearance of diffuse bilateral pulmonary nodules.  Multifocal lytic sclerotic bone metastasis.  New superior endplate deformity of T6   04/12/2021 - 09/29/2021 Chemotherapy   04/12/2021 - 09/08/2021 bevacizumab and Alimta.  09/29/2021 extra cycle of maintenance bevacizumab and Alimta while waiting for approval of Lumakras   09/21/2021 Imaging    bone scan showed multifocal abnormal uptake including mid to distal shaft of right femur, left femoral neck and trochanter, left sacroiliac region, left ischium.  Focal activity at the proximal tibia on the left.  Multi focal heterogeneous bilateral rib activity.  Focal activity at the sternum.   09/27/2021 Imaging   CT chest abdomen pelvis with contrast showed progression of infrahilar left lower lobe with a concerning clearly progressive masslike consolidation opacity.  Mild progression of small left pleural effusion.  Similar experience of extensive irregular right-sided pleural disease.   Similar experience of bony metastasis.    10/20/2021 -  Chemotherapy   Started on Lumakras   01/09/2022 Imaging   CT chest abdomen pelvis with contrast showed no substantial interval changes in exam.  Stable disease   03/20/2022 -  Chemotherapy   CT chest abdomen pelvis with contrast showed unchanged circumferential pleural thickening/pleural nodularity of the right hemithorax, left infrahilar mass and bilateral pulmonary nodules.  Stable right perihilar consolidation likely postradiation changes.  Unchanged numerous sclerotic osseous lesions.  Aortic atherosclerosis.     06/08/2022 Imaging   MRI sacrum SI joint w wo contrast Showed 1. Multiple sclerotic lesions of sacrum and bilateral ilia with enhancement on post contrast sequences consistent with metastatic disease. 2.  No evidence of sacroiliitis or joint effusion.3.  No significant finding of the neurovascular bundle bilaterally    06/08/2022 Imaging   MRI lumbar spine w wo contrast 1. Unchanged appearance of multiple sclerotic lesions involving all lumbar levels, consistent with osseous metastatic disease.2. Severe right L5-S1 neural foraminal stenosis.3. Mild right L4-5 neural foraminal stenosis    07/26/2022 Imaging   CT chest abdomen pelvis with contrast 1. Redemonstrated post treatment appearance of the chest with masslike consolidation and fibrosis of the perihilar right lung , mass of the superior segment left lower lobe, as well as extensive bilateral pleural thickening and nodularity, greater on the right, and numerous bilateral pulmonary nodules. 2. Increased nodularity about the right lung base, consistent with worsened pleural metastatic disease. Other nodules unchanged. 3. Unchanged post treatment appearance of mediastinal and right hilar lymph nodes as well as epicardial lymph nodes about the left ventricular apex. 4. Slightly enlarged lytic osseous metastatic lesion of the base of the right femoral neck measuring 2.4 x 2.2 cm,  previously 2.2 x 1.8 cm. This is potentially at risk for pathologic fracture given location.5. Otherwise unchanged widespread sclerotic osseous metastatic disease throughout the axial and proximal appendicular skeleton. 6. No evidence of soft tissue metastatic disease to the abdomen or pelvis. 6. No evidence of soft tissue metastatic disease to the abdomen or pelvis.   12/24/2022 Imaging   CT chest abdomen pelvis w contrast  CHEST IMPRESSION:   1. Bilateral peripheral pleural nodules are mildly increased in size. 2. Bilateral small effusions. Stable consolidation at the LEFT lung Base  3. Stable ill-defined tissue in the mediastinum suggesting treated tumor infiltrate.   PELVIS IMPRESSION:   1. No evidence of metastatic disease in the abdomen pelvis. 2. Stable sclerotic metastasis in the spine and pelvis. 3. Stable lytic lesion in the RIGHT femoral neck may be at risk for pathologic fracture. 4.  Aortic Atherosclerosis (ICD10-I70.0)     ALLERGIES:  is allergic to carboplatin.  MEDICATIONS:  Current Outpatient Medications  Medication Sig Dispense Refill   acetaminophen (TYLENOL) 500 MG tablet Take 1,000 mg by mouth every 8 (eight) hours as needed for moderate pain.      albuterol (VENTOLIN HFA) 108 (90 Base) MCG/ACT inhaler Inhale 2 puffs into the lungs every 6 (six) hours as needed for wheezing or shortness of breath. 8 g 2   benzonatate (TESSALON) 100 MG capsule Take 1 capsule by mouth three times daily as needed for cough 60 capsule 0   dexamethasone (DECADRON) 2 MG tablet Take 1 tablet (2 mg total) by mouth daily. 14 tablet 0   fluticasone-salmeterol (ADVAIR DISKUS) 250-50 MCG/ACT AEPB Inhale 1 puff into the lungs  in the morning and at bedtime. 60 each 0   gabapentin (NEURONTIN) 100 MG capsule Take 1 capsule (100 mg total) by mouth 3 (three) times daily. 90 capsule 0   ipratropium-albuterol (DUONEB) 0.5-2.5 (3) MG/3ML SOLN Take 3 mLs by nebulization 3 (three) times daily. (Patient not  taking: Reported on 02/07/2023) 360 mL 1   LORazepam (ATIVAN) 0.5 MG tablet Take 1 tablet (0.5 mg total) by mouth every 8 (eight) hours. 30 tablet 0   LUMAKRAS 120 MG tablet TAKE 8 TABLETS BY MOUTH DAILY 240 tablet 2   naloxone (NARCAN) nasal spray 4 mg/0.1 mL For suspected overdose of narcotics. (Patient not taking: Reported on 02/07/2023) 1 each 0   omeprazole (PRILOSEC) 20 MG capsule Take 1 capsule (20 mg total) by mouth daily as needed. Take Lumakras 4 hr before or 10 hr after taking omeprazole. 30 capsule 0   oxyCODONE (OXY IR/ROXICODONE) 5 MG immediate release tablet Take 1-2 tablets (5-10 mg total) by mouth every 4 (four) hours as needed for moderate pain or severe pain. 120 tablet 0   oxyCODONE (OXYCONTIN) 10 mg 12 hr tablet Take 1 tablet (10 mg total) by mouth every 12 (twelve) hours. 60 tablet 0   protein supplement shake (PREMIER PROTEIN) LIQD Take 11 oz by mouth daily.     psyllium (HYDROCIL/METAMUCIL) 95 % PACK Take 1 packet by mouth daily. (Patient not taking: Reported on 02/07/2023) 30 packet 0   sennosides-docusate sodium (SENOKOT-S) 8.6-50 MG tablet Take 1 tablet by mouth daily.     No current facility-administered medications for this visit.    VITAL SIGNS: BP 91/66 Comment: attempted orthostatic vitals- standing  Pulse (!) 115   Temp (!) 96.7 F (35.9 C) (Tympanic)   Resp (!) 22   SpO2 100%  There were no vitals filed for this visit.  Estimated body mass index is 16.74 kg/m as calculated from the following:   Height as of 02/05/23: '5\' 11"'$  (1.803 m).   Weight as of 02/05/23: 120 lb (54.4 kg).  LABS: CBC:    Component Value Date/Time   WBC 9.3 01/28/2023 1441   HGB 11.7 (L) 01/28/2023 1441   HCT 36.1 01/28/2023 1441   PLT 196 01/28/2023 1441   MCV 85.1 01/28/2023 1441   NEUTROABS 8.3 (H) 01/28/2023 1441   LYMPHSABS 0.4 (L) 01/28/2023 1441   MONOABS 0.6 01/28/2023 1441   EOSABS 0.0 01/28/2023 1441   BASOSABS 0.0 01/28/2023 1441   Comprehensive Metabolic Panel:     Component Value Date/Time   NA 130 (L) 01/28/2023 1441   K 3.5 01/28/2023 1441   CL 92 (L) 01/28/2023 1441   CO2 27 01/28/2023 1441   BUN 17 01/28/2023 1441   CREATININE 0.61 01/28/2023 1441   GLUCOSE 116 (H) 01/28/2023 1441   CALCIUM 8.4 (L) 01/28/2023 1441   AST 19 01/28/2023 1441   ALT 12 01/28/2023 1441   ALKPHOS 74 01/28/2023 1441   BILITOT 0.5 01/28/2023 1441   PROT 6.8 01/28/2023 1441   ALBUMIN 2.9 (L) 01/28/2023 1441    RADIOGRAPHIC STUDIES: US Venous Img Lower Bilateral (DVT)  Result Date: 01/21/2023 CLINICAL DATA:  Shortness of breath. EXAM: BILATERAL LOWER EXTREMITY VENOUS DOPPLER ULTRASOUND TECHNIQUE: Gray-scale sonography with compression, as well as color and duplex ultrasound, were performed to evaluate the deep venous system(s) from the level of the common femoral vein through the popliteal and proximal calf veins. COMPARISON:  None Available. FINDINGS: VENOUS Normal compressibility of the common femoral, superficial femoral, and popliteal veins, as  well as the visualized calf veins. Visualized portions of profunda femoral vein and great saphenous vein unremarkable. No filling defects to suggest DVT on grayscale or color Doppler imaging. Doppler waveforms show normal direction of venous flow, normal respiratory plasticity and response to augmentation. OTHER None. Limitations: none IMPRESSION: No sonographic evidence of bilateral lower extremity DVT. Electronically Signed   By: Keith Rake M.D.   On: 01/21/2023 20:55   DG Chest 2 View  Result Date: 01/21/2023 CLINICAL DATA:  Shortness of breath with movement and walking, stage IV lung cancer EXAM: CHEST - 2 VIEW COMPARISON:  Earlier study of 01/21/2023 at 1033 hours FINDINGS: Normal heart size, mediastinal contours, and pulmonary vascularity. Changes of COPD with chronic opacity at RIGHT apex and perihilar region likely related to prior therapy. Persistent small bibasilar pleural effusions greater on RIGHT. Nodular foci in  both lungs consistent with known metastatic disease. No pneumothorax. Bones demineralized with chronic height loss of an upper thoracic vertebra unchanged. Scattered sclerotic foci consistent with osseous metastases in spine, RIGHT scapula, proximal RIGHT humerus. IMPRESSION: Post therapy changes in the RIGHT lung. Pulmonary metastatic disease with small bibasilar effusions greater on RIGHT. Osseous metastases. Emphysema (ICD10-J43.9). Electronically Signed   By: Lavonia Dana M.D.   On: 01/21/2023 17:16   DG Chest Portable 1 View  Result Date: 01/21/2023 CLINICAL DATA:  Shortness of breath on exertion. Stage IV lung cancer. EXAM: PORTABLE CHEST 1 VIEW COMPARISON:  01/15/2023 and CT chest 01/17/2023. FINDINGS: Trachea is midline. Heart size normal. Extensive post radiation in the upper right hemithorax and right perihilar region. Bilateral pleuroparenchymal nodularity, as on comparison CT. Small bilateral pleural effusions. Findings are unchanged from 01/15/2023. IMPRESSION: 1. Pulmonary metastatic disease. 2. Small bilateral pleural effusions. 3. Post radiation changes in the upper right hemithorax and right perihilar region. 4. No superimposed acute abnormality. Electronically Signed   By: Lorin Picket M.D.   On: 01/21/2023 10:46   CT Angio Chest Pulmonary Embolism (PE) W or WO Contrast  Result Date: 01/17/2023 CLINICAL DATA:  COPD, history of metastatic lung cancer, worsening dyspnea and cough EXAM: CT ANGIOGRAPHY CHEST WITH CONTRAST TECHNIQUE: Multidetector CT imaging of the chest was performed using the standard protocol during bolus administration of intravenous contrast. Multiplanar CT image reconstructions and MIPs were obtained to evaluate the vascular anatomy. RADIATION DOSE REDUCTION: This exam was performed according to the departmental dose-optimization program which includes automated exposure control, adjustment of the mA and/or kV according to patient size and/or use of iterative  reconstruction technique. CONTRAST:  28m OMNIPAQUE IOHEXOL 350 MG/ML SOLN COMPARISON:  01/11/2023 FINDINGS: Cardiovascular: This is a technically adequate evaluation of the pulmonary vasculature. No filling defects or pulmonary emboli. The heart is unremarkable without pericardial effusion. No evidence of thoracic aortic aneurysm or dissection. Stable atherosclerosis of the aorta and coronary vasculature. 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 Mediastinum/Nodes: Loss of normal fat planes throughout the mediastinum may reflect post therapeutic change or sequela of metastatic disease. Discrete adenopathy or mass is difficult to measure, but the appearance is stable since the previous evaluation. Thyroid, trachea, and esophagus are grossly unremarkable. Lungs/Pleura: Chronic areas of consolidation within the bilateral lower lobes are again noted and unchanged. Extensive pleural nodularity again noted bilaterally, right greater than left. Chronic scarring and bronchovascular thickening within the right hilar region unchanged. Index nodules measured previously are as follows: Left upper lobe, image 50/6, 8 mm.  Previously 7 mm. Right lower lobe, image 103/6, 16 mm.  Previously 14 mm. Right middle lobe, image 103/6, 13 mm.  Previously 12 mm. Left upper lobe, image 99/6, 13 mm.  Previously 12 mm. Trace bilateral pleural effusions are again noted.  No pneumothorax. Upper Abdomen: No acute abnormality. Musculoskeletal: Stable sclerotic metastatic disease within the bilateral scapula, sternum, and thoracic spine. There are no acute displaced fractures. Reconstructed images demonstrate no additional findings. Review of the MIP images confirms the above findings. IMPRESSION: 1. No evidence of pulmonary embolus. 2. Findings consistent with known metastatic lung cancer, with slight interval increase in  size of the pleural and parenchymal nodularity seen previously. 3. Stable diffuse sclerotic bony metastases. 4.  Aortic Atherosclerosis (ICD10-I70.0). Electronically Signed   By: Randa Ngo M.D.   On: 01/17/2023 16:37   ECHOCARDIOGRAM COMPLETE  Result Date: 01/16/2023    ECHOCARDIOGRAM REPORT   Patient Name:   CATI UHLE Date of Exam: 01/16/2023 Medical Rec #:  [id number]        Height:       71.0 in Accession #:    [id number]       Weight:       133.8 lb Date of Birth:  [birth year]        BSA:          1.778 m Patient Age:    31 years         BP:           129/71 mmHg Patient Gender: F                HR:           110 bpm. Exam Location:  [facility] Procedure: 2D Echo, Color Doppler and Cardiac Doppler Indications:    Acute respiratory distress R06.03  History:        Patient has no prior history of Echocardiogram examinations.                 COPD.  Sonographer:    Sherrie Sport Referring Phys: [id number] [facility] GRIFFITH Diagnosing      Kate Sable MD Phys:  Sonographer Comments: Image acquisition challenging due to patient body habitus. IMPRESSIONS  1. Left ventricular ejection fraction, by estimation, is 60 to 65%. The left ventricle has normal function. The left ventricle has no regional wall motion abnormalities. Left ventricular diastolic parameters are consistent with Grade I diastolic dysfunction (impaired relaxation).  2. Right ventricular systolic function is normal. The right ventricular size is normal.  3. The mitral valve is normal in structure. No evidence of mitral valve regurgitation.  4. The aortic valve is tricuspid. Aortic valve regurgitation is not visualized. FINDINGS  Left Ventricle: Left ventricular ejection fraction, by estimation, is 60 to 65%. The left ventricle has normal function. The left ventricle has no regional wall motion abnormalities. The left ventricular internal cavity size was normal in size. There is  no left ventricular hypertrophy. Left ventricular diastolic parameters  are consistent with Grade I diastolic dysfunction (impaired relaxation). Right Ventricle: The right ventricular size is normal. No increase in right ventricular wall thickness. Right ventricular systolic function is normal. Left Atrium: Left atrial size was normal in size. Right Atrium: Right atrial size was normal in size. Pericardium: There is no evidence of pericardial effusion. Mitral Valve: The mitral valve is normal in structure. No evidence of mitral valve regurgitation. Tricuspid Valve: The tricuspid valve is normal in structure. Tricuspid valve regurgitation is not demonstrated. Aortic  Valve: The aortic valve is tricuspid. Aortic valve regurgitation is not visualized. Aortic valve mean gradient measures 3.0 mmHg. Aortic valve peak gradient measures 4.8 mmHg. Aortic valve area, by VTI measures 2.30 cm. Pulmonic Valve: The pulmonic valve was normal in structure. Pulmonic valve regurgitation is not visualized. Aorta: The aortic root is normal in size and structure. Venous: The inferior vena cava was not well visualized. IAS/Shunts: No atrial level shunt detected by color flow Doppler.  LEFT VENTRICLE PLAX 2D LVIDd:         3.20 cm   Diastology LVIDs:         1.90 cm   LV e' medial:    7.07 cm/s LV PW:         0.90 cm   LV E/e' medial:  9.1 LV IVS:        1.10 cm   LV e' lateral:   9.25 cm/s LVOT diam:     1.90 cm   LV E/e' lateral: 7.0 LV SV:         37 LV SV Index:   21 LVOT Area:     2.84 cm  RIGHT VENTRICLE RV Basal diam:  3.60 cm RV Mid diam:    2.70 cm RV S prime:     18.40 cm/s TAPSE (M-mode): 2.1 cm LEFT ATRIUM           Index       RIGHT ATRIUM          Index LA diam:      1.50 cm 0.84 cm/m  RA Area:     9.50 cm LA Vol (A2C): 6.9 ml  3.88 ml/m  RA Volume:   17.30 ml 9.73 ml/m LA Vol (A4C): 10.0 ml 5.63 ml/m  AORTIC VALVE AV Area (Vmax):    2.35 cm AV Area (Vmean):   2.05 cm AV Area (VTI):     2.30 cm AV Vmax:           109.00 cm/s AV Vmean:          76.900 cm/s AV VTI:            0.160 m AV  Peak Grad:      4.8 mmHg AV Mean Grad:      3.0 mmHg LVOT Vmax:         90.20 cm/s LVOT Vmean:        55.600 cm/s LVOT VTI:          0.130 m LVOT/AV VTI ratio: 0.81  AORTA Ao Root diam: 3.20 cm MITRAL VALVE               TRICUSPID VALVE MV Area (PHT): 6.90 cm    TR Peak grad:   31.6 mmHg MV Decel Time: 110 msec    TR Vmax:        281.00 cm/s MV E velocity: 64.50 cm/s MV A velocity: 96.90 cm/s  SHUNTS MV E/A ratio:  0.67        Systemic VTI:  0.13 m                            Systemic Diam: 1.90 cm Kate Sable MD Electronically signed by Kate Sable MD Signature Date/Time: 01/16/2023/4:29:24 PM    Final    DG Chest Port 1 View  Result Date: 01/15/2023 CLINICAL DATA:  Shortness of breath.  COPD.  Metastatic lung cancer. EXAM: PORTABLE CHEST 1 VIEW COMPARISON:  01/11/2023 FINDINGS:  Heart size remains normal. Chronic volume loss and scarring in the right upper chest remains stable. Chronic bilateral pleural blunting remains stable. Chronic volume loss/infiltrate in both lower lobes. Compared to the study of 4 days ago, findings appear quite similar. No new process is identified. IMPRESSION: No significant change since the study of 4 days ago. Chronic volume loss and scarring in the right upper chest. 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 Electronically Signed   By: Nelson Chimes M.D.   On: 01/15/2023 08:07    PERFORMANCE STATUS (ECOG) : 1 - Symptomatic but completely ambulatory  Review of Systems Unless otherwise noted, a complete review of systems is negative.  Physical Exam General: NAD Cardiovascular: regular rate and rhythm Pulmonary: some wheezing, diminished L side Abdomen: soft, nontender, + bowel sounds GU: no suprapubic tenderness Extremities: no edema, no joint deformities Skin: no rashes Neurological: Weakness but otherwise nonfocal  IMPRESSION/PLAN: Stage IV NSCLC -patient on second line Lumakras.  February CT showed slight  progression.  There are other alternative treatment lines available if needed such as chemo/immunotherapy.  Patient will see Dr. [name] next month.  Dehydration -proceed with IV fluids today.  Will bring patient back later this week for repeat labs/fluids.  Encouraged her to push oral fluids.  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  Discussed daily bowel regimen.  Weakness -referral to [facility]   Patient expressed understanding and was in agreement with this plan. She also understands that She can call clinic at any time with any questions, concerns, or complaints.   Thank you for allowing me to participate in the care of this very pleasant patient.   Time Total: 15 minutes  Visit consisted of counseling and education dealing with the complex and emotionally intense issues of symptom management in the setting of serious illness.Greater than 50%  of this time was spent counseling and coordinating care related to the above assessment and plan.  Signed by: Altha Harm, PhD, NP-C

## 2023-02-14 ENCOUNTER — Telehealth: Payer: Self-pay | Admitting: *Deleted

## 2023-02-14 ENCOUNTER — Encounter: Payer: Self-pay | Admitting: Hospice and Palliative Medicine

## 2023-02-14 NOTE — Telephone Encounter (Signed)
[name] called requesting to speak with Josh regarding patient pain medicine and pain Please return her call

## 2023-02-15 ENCOUNTER — Inpatient Hospital Stay (HOSPITAL_BASED_OUTPATIENT_CLINIC_OR_DEPARTMENT_OTHER): Payer: Medicaid Other | Admitting: Hospice and Palliative Medicine

## 2023-02-15 ENCOUNTER — Inpatient Hospital Stay: Payer: Medicaid Other | Attending: Radiation Oncology

## 2023-02-15 ENCOUNTER — Inpatient Hospital Stay: Payer: Medicaid Other

## 2023-02-15 ENCOUNTER — Encounter: Payer: Self-pay | Admitting: Oncology

## 2023-02-15 DIAGNOSIS — C3411 Malignant neoplasm of upper lobe, right bronchus or lung: Secondary | ICD-10-CM | POA: Insufficient documentation

## 2023-02-15 DIAGNOSIS — J449 Chronic obstructive pulmonary disease, unspecified: Secondary | ICD-10-CM | POA: Insufficient documentation

## 2023-02-15 DIAGNOSIS — J9611 Chronic respiratory failure with hypoxia: Secondary | ICD-10-CM | POA: Diagnosis not present

## 2023-02-15 DIAGNOSIS — E860 Dehydration: Secondary | ICD-10-CM | POA: Diagnosis not present

## 2023-02-15 DIAGNOSIS — F419 Anxiety disorder, unspecified: Secondary | ICD-10-CM | POA: Diagnosis not present

## 2023-02-15 DIAGNOSIS — Z79891 Long term (current) use of opiate analgesic: Secondary | ICD-10-CM | POA: Insufficient documentation

## 2023-02-15 DIAGNOSIS — Z7952 Long term (current) use of systemic steroids: Secondary | ICD-10-CM | POA: Insufficient documentation

## 2023-02-15 DIAGNOSIS — Z79899 Other long term (current) drug therapy: Secondary | ICD-10-CM | POA: Insufficient documentation

## 2023-02-15 DIAGNOSIS — Z9981 Dependence on supplemental oxygen: Secondary | ICD-10-CM | POA: Insufficient documentation

## 2023-02-15 DIAGNOSIS — Z515 Encounter for palliative care: Secondary | ICD-10-CM

## 2023-02-15 DIAGNOSIS — Z923 Personal history of irradiation: Secondary | ICD-10-CM | POA: Diagnosis not present

## 2023-02-15 DIAGNOSIS — R5383 Other fatigue: Secondary | ICD-10-CM | POA: Diagnosis not present

## 2023-02-15 DIAGNOSIS — G893 Neoplasm related pain (acute) (chronic): Secondary | ICD-10-CM | POA: Diagnosis not present

## 2023-02-15 DIAGNOSIS — Z87891 Personal history of nicotine dependence: Secondary | ICD-10-CM | POA: Diagnosis not present

## 2023-02-15 DIAGNOSIS — R000 Tachycardia, unspecified: Secondary | ICD-10-CM | POA: Diagnosis not present

## 2023-02-15 DIAGNOSIS — C3490 Malignant neoplasm of unspecified part of unspecified bronchus or lung: Secondary | ICD-10-CM

## 2023-02-15 DIAGNOSIS — C7951 Secondary malignant neoplasm of bone: Secondary | ICD-10-CM | POA: Diagnosis present

## 2023-02-15 DIAGNOSIS — R531 Weakness: Secondary | ICD-10-CM | POA: Insufficient documentation

## 2023-02-15 DIAGNOSIS — C782 Secondary malignant neoplasm of pleura: Secondary | ICD-10-CM | POA: Insufficient documentation

## 2023-02-15 DIAGNOSIS — J910 Malignant pleural effusion: Secondary | ICD-10-CM | POA: Insufficient documentation

## 2023-02-15 DIAGNOSIS — R634 Abnormal weight loss: Secondary | ICD-10-CM | POA: Insufficient documentation

## 2023-02-15 LAB — CBC WITH DIFFERENTIAL/PLATELET
Abs Immature Granulocytes: 0.02 10*3/uL (ref 0.00–0.07)
Basophils Absolute: 0 10*3/uL (ref 0.0–0.1)
Basophils Relative: 0 %
Eosinophils Absolute: 0 10*3/uL (ref 0.0–0.5)
Eosinophils Relative: 0 %
HCT: 36.3 % (ref 36.0–46.0)
Hemoglobin: 11.2 g/dL — ABNORMAL LOW (ref 12.0–15.0)
Immature Granulocytes: 0 %
Lymphocytes Relative: 9 %
Lymphs Abs: 0.5 10*3/uL — ABNORMAL LOW (ref 0.7–4.0)
MCH: 27.5 pg (ref 26.0–34.0)
MCHC: 30.9 g/dL (ref 30.0–36.0)
MCV: 89.2 fL (ref 80.0–100.0)
Monocytes Absolute: 0.5 10*3/uL (ref 0.1–1.0)
Monocytes Relative: 10 %
Neutro Abs: 4 10*3/uL (ref 1.7–7.7)
Neutrophils Relative %: 81 %
Platelets: 198 10*3/uL (ref 150–400)
RBC: 4.07 MIL/uL (ref 3.87–5.11)
RDW: 15.2 % (ref 11.5–15.5)
WBC: 5 10*3/uL (ref 4.0–10.5)
nRBC: 0 % (ref 0.0–0.2)

## 2023-02-15 LAB — COMPREHENSIVE METABOLIC PANEL
ALT: 12 U/L (ref 0–44)
AST: 18 U/L (ref 15–41)
Albumin: 2.9 g/dL — ABNORMAL LOW (ref 3.5–5.0)
Alkaline Phosphatase: 183 U/L — ABNORMAL HIGH (ref 38–126)
Anion gap: 8 (ref 5–15)
BUN: 10 mg/dL (ref 8–23)
CO2: 30 mmol/L (ref 22–32)
Calcium: 8.8 mg/dL — ABNORMAL LOW (ref 8.9–10.3)
Chloride: 98 mmol/L (ref 98–111)
Creatinine, Ser: 0.51 mg/dL (ref 0.44–1.00)
GFR, Estimated: >60 mL/min (ref >60)
Glucose, Bld: 112 mg/dL — ABNORMAL HIGH (ref 70–99)
Potassium: 3.9 mmol/L (ref 3.5–5.1)
Sodium: 136 mmol/L (ref 135–145)
Total Bilirubin: 0.3 mg/dL (ref 0.3–1.2)
Total Protein: 6.7 g/dL (ref 6.5–8.1)

## 2023-02-15 NOTE — Progress Notes (Signed)
Symptom Management [place] at [facility] [phone] [phone]  Patient Care Team: Pcp, No as PCP - General Telford Nab, RN as Oncology Nurse Navigator [name], MD as Medical Oncologist (Oncology)   NAME OF PATIENT: Brittney Fisher  [id number]  [birth year]   DATE OF VISIT: 02/15/23  REASON FOR CONSULT: Brittney Fisher is a 62 y.o. 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   INTERVAL HISTORY: Patient returns to clinic today for labs and possible fluids.  Daughter requested that we talk.  She verbalized concerns that patient has not been taking her medications as prescribed and she is finding missed doses/pills lying around the house.  She also wanted to discuss increasing support.  Patient denies any significant changes or concerns.  No new symptomatic complaints.  She feels like her oral intake has improved.  Pain is reportedly stable.  Denies any neurologic complaints. Denies recent fevers or illnesses. Denies any easy bleeding or bruising. Reports poor appetite. Denies chest pain. Denies any nausea, vomiting, constipation, or diarrhea. Denies urinary complaints. Patient offers no further specific complaints today.   PAST MEDICAL HISTORY: Past Medical History:  Diagnosis Date   COPD (chronic obstructive pulmonary disease) (HCC)    GERD (gastroesophageal reflux disease)    Metastatic lung cancer (metastasis from lung to other site) ([place]) 11/02/2020    PAST SURGICAL HISTORY:  Past Surgical History:  Procedure Laterality Date   COLONOSCOPY WITH ESOPHAGOGASTRODUODENOSCOPY (EGD)     COLONOSCOPY WITH PROPOFOL N/A 11/14/2015   Procedure: COLONOSCOPY WITH PROPOFOL;  Surgeon: Hulen Luster, MD;  Location: [facility] ENDOSCOPY;  Service: Gastroenterology;  Laterality: N/A;    ESOPHAGOGASTRODUODENOSCOPY (EGD) WITH PROPOFOL N/A 11/14/2015   Procedure: ESOPHAGOGASTRODUODENOSCOPY (EGD) WITH PROPOFOL;  Surgeon: Hulen Luster, MD;  Location: [facility] ENDOSCOPY;  Service: Gastroenterology;  Laterality: N/A;   FEMUR IM NAIL Right 01/01/2023   Procedure: TREATMENT, INTERTROCHANTERIC FEMORAL FRACTURE, WITH INTRAMEDULLARY IMPLANT (SURG);  Surgeon: Lovell Sheehan, MD;  Location: [facility] ORS;  Service: Orthopedics;  Laterality: Right;    HEMATOLOGY/ONCOLOGY HISTORY:  Oncology History  Metastatic lung cancer (metastasis from lung to other site) ([place])  11/02/2020 Initial Diagnosis   Metastatic lung cancer (metastasis from lung to other site) [facility])   11/18/2020 - 09/29/2021 Chemotherapy   Patient is on Treatment Plan : LUNG PEMEtrexed (Alimta) + CARBOplatin + Bevacizumab q21d x 1 cycle      03/20/2022 Imaging   CT chest abdomen pelvis w contrast 1. Unchanged circumferential pleural thickening and pleural nodularity of the right hemithorax, left infrahilar mass, and bilateral pulmonary nodules. 2. Stable right perihilar consolidation, likely post radiation changes. 3. Unchanged numerous sclerotic osseous lesions.4.  Aortic Atherosclerosis (ICD10-I70.0).    06/08/2022 Imaging   MRI lumbar spine w and wo 1. Unchanged appearance of multiple sclerotic lesions involving all lumbar levels, consistent with osseous metastatic disease.2. Severe right L5-S1 neural foraminal stenosis.3. Mild right L4-5 neural foraminal stenosis  MR Sacrum SI Joints w wo contrast 1. Multiple sclerotic lesions of sacrum and bilateral ilia with enhancement on post contrast sequences consistent with metastatic disease.2.  No evidence of sacroiliitis or joint effusion. 3.  No significant finding of the neurovascular bundle bilaterally     Imaging     Primary malignant neoplasm of lung metastatic to other site [facility])  11/04/2020 Initial Diagnosis   Primary malignant neoplasm of lung metastatic to  other site  [facility])  10/20/2020, patient was brought to ED via EMS due to generalized weakness, dizziness, chest pain shortness of breath.  She was recently diagnosed with COPD approximately 1 month ago by primary care provider.  Also unintentional weight loss during the last few months. Image work-up showed right-sided pleural effusion.  She underwent right thoracentesis.  With removal of 2.4 L of fluid. 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  Metastatic disease to low cervical/thoracic nodes, lung, right pleural space and bones.  Moderate to large right pleural effusion.  SVC narrowing. 10/21/2020 MRI brain is negative for metastasis.  Mild chronic microvascular ischemic changes in the white matter.  Negative for acute infarct Regarding to the SVC narrowing, patient was seen by vascular surgeon and was recommended no intervention inpatient.  Patient to follow-up outpatient with vascular surgeon for evaluation. Patient also was treated for COPD exacerbation with hypoxia on exertion. Qualifies for home oxygen and hospitalist arrange home health and a nebulizer.  Patient was given a course of prednisone taper and empiric Levaquin. Patient was discharged and present today to follow-up with cytology results and further management plan   10/27/2020 PET scan showed right upper lobe primary bronchogenic mass with direct invasion into mediastinum. Metastatic disease to the right pleural space, lung, bone, nodes of the chest and less so lower leg/upper abdomen. Hypermetabolic him along the course of the right axillary vein with concurrent subtle hyper attenuation within the axillary vein and SVC.  This continues to the level of SVC narrowing.  Suspicious for SVC occlusion and developing thrombus. Moderate right pleural effusion and small pericardial effusion.   # # left supraclavicular mass biopsy- pathology is positive for  adenocarcinoma. positive for CK7, with patchy, weak CK20.  They are  negative for TTF1, NapsinA, GATA3, CDX2, and Pax8.  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  #NGS showed AXIN 1 [id number], DIS2 M173f, KRAS G12C, PRDM1 M1 [id number], [id number] MS stable, TPS <1%      11/04/2020 Cancer Staging   Staging form: Lung, AJCC 8th Edition - Clinical stage from 11/04/2020: Stage IV (cT4, cN3, cM1) - Signed by YEarlie Server MD on 11/04/2020   11/14/2020 - 12/12/2020 Radiation Therapy   #Bronchial obstruction and SVC occlusion.  s/p palliative radiation   11/18/2020 - 03/22/2022 Chemotherapy   carboplatin/Alimta/bevacizumab x 6 cycles   Infusion reaction of carboplatin during cycle 6.  Carboplatin discontinued.   04/07/2021 Imaging   CT chest abdomen pelvis-partial response Mild decrease in size of right lung mass, mildly decreased mediastinal and hilar adenopathy.  Extensive right-sided pleural metastasis unchanged.  Similar appearance of diffuse bilateral pulmonary nodules.  Multifocal lytic sclerotic bone metastasis.  New superior endplate deformity of T6   04/12/2021 - 09/29/2021 Chemotherapy   04/12/2021 - 09/08/2021 bevacizumab and Alimta.  09/29/2021 extra cycle of maintenance bevacizumab and Alimta while waiting for approval of Lumakras   09/21/2021 Imaging    bone scan showed multifocal abnormal uptake including mid to distal shaft of right femur, left femoral neck and trochanter, left sacroiliac region, left ischium.  Focal activity at the proximal tibia on the left.  Multi focal heterogeneous bilateral rib activity.  Focal activity at the sternum.   09/27/2021 Imaging   CT chest abdomen pelvis with contrast showed  progression of infrahilar left lower lobe with a concerning clearly progressive  masslike consolidation opacity.  Mild progression of small left pleural effusion.  Similar experience of extensive irregular right-sided pleural disease.  Similar experience of bony metastasis.    10/20/2021 -  Chemotherapy   Started on Lumakras   01/09/2022 Imaging   CT chest abdomen pelvis with contrast showed no substantial interval changes in exam.  Stable disease   03/20/2022 -  Chemotherapy   CT chest abdomen pelvis with contrast showed unchanged circumferential pleural thickening/pleural nodularity of the right hemithorax, left infrahilar mass and bilateral pulmonary nodules.  Stable right perihilar consolidation likely postradiation changes.  Unchanged numerous sclerotic osseous lesions.  Aortic atherosclerosis.     06/08/2022 Imaging   MRI sacrum SI joint w wo contrast Showed 1. Multiple sclerotic lesions of sacrum and bilateral ilia with enhancement on post contrast sequences consistent with metastatic disease. 2.  No evidence of sacroiliitis or joint effusion.3.  No significant finding of the neurovascular bundle bilaterally    06/08/2022 Imaging   MRI lumbar spine w wo contrast 1. Unchanged appearance of multiple sclerotic lesions involving all lumbar levels, consistent with osseous metastatic disease.2. Severe right L5-S1 neural foraminal stenosis.3. Mild right L4-5 neural foraminal stenosis    07/26/2022 Imaging   CT chest abdomen pelvis with contrast 1. Redemonstrated post treatment appearance of the chest with masslike consolidation and fibrosis of the perihilar right lung , mass of the superior segment left lower lobe, as well as extensive bilateral pleural thickening and nodularity, greater on the right, and numerous bilateral pulmonary nodules. 2. Increased nodularity about the right lung base, consistent with worsened pleural metastatic disease. Other nodules unchanged. 3. Unchanged post treatment appearance of mediastinal and right hilar lymph nodes as well as epicardial lymph  nodes about the left ventricular apex. 4. Slightly enlarged lytic osseous metastatic lesion of the base of the right femoral neck measuring 2.4 x 2.2 cm, previously 2.2 x 1.8 cm. This is potentially at risk for pathologic fracture given location.5. Otherwise unchanged widespread sclerotic osseous metastatic disease throughout the axial and proximal appendicular skeleton. 6. No evidence of soft tissue metastatic disease to the abdomen or pelvis. 6. No evidence of soft tissue metastatic disease to the abdomen or pelvis.   12/24/2022 Imaging   CT chest abdomen pelvis w contrast  CHEST IMPRESSION:   1. Bilateral peripheral pleural nodules are mildly increased in size. 2. Bilateral small effusions. Stable consolidation at the LEFT lung Base  3. Stable ill-defined tissue in the mediastinum suggesting treated tumor infiltrate.   PELVIS IMPRESSION:   1. No evidence of metastatic disease in the abdomen pelvis. 2. Stable sclerotic metastasis in the spine and pelvis. 3. Stable lytic lesion in the RIGHT femoral neck may be at risk for pathologic fracture. 4.  Aortic Atherosclerosis (ICD10-I70.0)     ALLERGIES:  is allergic to carboplatin.  MEDICATIONS:  Current Outpatient Medications  Medication Sig Dispense Refill   acetaminophen (TYLENOL) 500 MG tablet Take 1,000 mg by mouth every 8 (eight) hours as needed for moderate pain.      albuterol (VENTOLIN HFA) 108 (90 Base) MCG/ACT inhaler Inhale 2 puffs into the lungs every 6 (six) hours as needed for wheezing or shortness of breath. 8 g 2   benzonatate (TESSALON) 100 MG capsule Take 1 capsule by mouth three times daily as needed for cough 60 capsule 0   dexamethasone (DECADRON) 2 MG tablet Take 1 tablet (2 mg total) by mouth daily. 14 tablet  0   fluticasone-salmeterol (ADVAIR DISKUS) 250-50 MCG/ACT AEPB Inhale 1 puff into the lungs in the morning and at bedtime. 60 each 0   gabapentin (NEURONTIN) 100 MG capsule Take 1 capsule (100 mg total) by mouth 3  (three) times daily. 90 capsule 0   ipratropium-albuterol (DUONEB) 0.5-2.5 (3) MG/3ML SOLN Take 3 mLs by nebulization 3 (three) times daily. (Patient not taking: Reported on 02/07/2023) 360 mL 1   LORazepam (ATIVAN) 0.5 MG tablet Take 1 tablet (0.5 mg total) by mouth every 8 (eight) hours. (Patient not taking: Reported on 02/12/2023) 30 tablet 0   LUMAKRAS 120 MG tablet TAKE 8 TABLETS BY MOUTH DAILY 240 tablet 2   naloxone (NARCAN) nasal spray 4 mg/0.1 mL For suspected overdose of narcotics. (Patient not taking: Reported on 02/07/2023) 1 each 0   omeprazole (PRILOSEC) 20 MG capsule Take 1 capsule (20 mg total) by mouth daily as needed. Take Lumakras 4 hr before or 10 hr after taking omeprazole. (Patient not taking: Reported on 02/12/2023) 30 capsule 0   oxyCODONE (OXY IR/ROXICODONE) 5 MG immediate release tablet Take 1-2 tablets (5-10 mg total) by mouth every 4 (four) hours as needed for moderate pain or severe pain. 120 tablet 0   oxyCODONE (OXYCONTIN) 10 mg 12 hr tablet Take 1 tablet (10 mg total) by mouth every 12 (twelve) hours. 60 tablet 0   protein supplement shake (PREMIER PROTEIN) LIQD Take 11 oz by mouth daily.     psyllium (HYDROCIL/METAMUCIL) 95 % PACK Take 1 packet by mouth daily. (Patient not taking: Reported on 02/07/2023) 30 packet 0   sennosides-docusate sodium (SENOKOT-S) 8.6-50 MG tablet Take 1 tablet by mouth daily.     No current facility-administered medications for this visit.    VITAL SIGNS: There were no vitals taken for this visit. There were no vitals filed for this visit.  Estimated body mass index is 16.67 kg/m as calculated from the following:   Height as of 02/12/23: '5\' 11"'$  (1.803 m).   Weight as of 02/12/23: 119 lb 8 oz (54.2 kg).  LABS: CBC:    Component Value Date/Time   WBC 5.0 02/15/2023 0939   HGB 11.2 (L) 02/15/2023 0939   HGB 11.4 (L) 02/12/2023 0819   HCT 36.3 02/15/2023 0939   PLT 198 02/15/2023 0939   PLT 194 02/12/2023 0819   MCV 89.2 02/15/2023  0939   NEUTROABS 4.0 02/15/2023 0939   LYMPHSABS 0.5 (L) 02/15/2023 0939   MONOABS 0.5 02/15/2023 0939   EOSABS 0.0 02/15/2023 0939   BASOSABS 0.0 02/15/2023 0939   Comprehensive Metabolic Panel:    Component Value Date/Time   NA 136 02/15/2023 0939   K 3.9 02/15/2023 0939   CL 98 02/15/2023 0939   CO2 30 02/15/2023 0939   BUN 10 02/15/2023 0939   CREATININE 0.51 02/15/2023 0939   CREATININE 0.52 02/12/2023 0819   GLUCOSE 112 (H) 02/15/2023 0939   CALCIUM 8.8 (L) 02/15/2023 0939   AST 18 02/15/2023 0939   AST 21 02/12/2023 0819   ALT 12 02/15/2023 0939   ALT 13 02/12/2023 0819   ALKPHOS 183 (H) 02/15/2023 0939   BILITOT 0.3 02/15/2023 0939   BILITOT 0.4 02/12/2023 0819   PROT 6.7 02/15/2023 0939   ALBUMIN 2.9 (L) 02/15/2023 0939    RADIOGRAPHIC STUDIES: US Venous Img Lower Bilateral (DVT)  Result Date: 01/21/2023 CLINICAL DATA:  Shortness of breath. EXAM: BILATERAL LOWER EXTREMITY VENOUS DOPPLER ULTRASOUND TECHNIQUE: Gray-scale sonography with compression, as well as color and duplex  ultrasound, were performed to evaluate the deep venous system(s) from the level of the common femoral vein through the popliteal and proximal calf veins. COMPARISON:  None Available. FINDINGS: VENOUS Normal compressibility of the common femoral, superficial femoral, and popliteal veins, as well as the visualized calf veins. Visualized portions of profunda femoral vein and great saphenous vein unremarkable. No filling defects to suggest DVT on grayscale or color Doppler imaging. Doppler waveforms show normal direction of venous flow, normal respiratory plasticity and response to augmentation. OTHER None. Limitations: none IMPRESSION: No sonographic evidence of bilateral lower extremity DVT. Electronically Signed   By: Keith Rake M.D.   On: 01/21/2023 20:55   DG Chest 2 View  Result Date: 01/21/2023 CLINICAL DATA:  Shortness of breath with movement and walking, stage IV lung cancer EXAM: CHEST - 2  VIEW COMPARISON:  Earlier study of 01/21/2023 at 1033 hours FINDINGS: Normal heart size, mediastinal contours, and pulmonary vascularity. Changes of COPD with chronic opacity at RIGHT apex and perihilar region likely related to prior therapy. Persistent small bibasilar pleural effusions greater on RIGHT. Nodular foci in both lungs consistent with known metastatic disease. No pneumothorax. Bones demineralized with chronic height loss of an upper thoracic vertebra unchanged. Scattered sclerotic foci consistent with osseous metastases in spine, RIGHT scapula, proximal RIGHT humerus. IMPRESSION: Post therapy changes in the RIGHT lung. Pulmonary metastatic disease with small bibasilar effusions greater on RIGHT. Osseous metastases. Emphysema (ICD10-J43.9). Electronically Signed   By: Lavonia Dana M.D.   On: 01/21/2023 17:16   DG Chest Portable 1 View  Result Date: 01/21/2023 CLINICAL DATA:  Shortness of breath on exertion. Stage IV lung cancer. EXAM: PORTABLE CHEST 1 VIEW COMPARISON:  01/15/2023 and CT chest 01/17/2023. FINDINGS: Trachea is midline. Heart size normal. Extensive post radiation in the upper right hemithorax and right perihilar region. Bilateral pleuroparenchymal nodularity, as on comparison CT. Small bilateral pleural effusions. Findings are unchanged from 01/15/2023. IMPRESSION: 1. Pulmonary metastatic disease. 2. Small bilateral pleural effusions. 3. Post radiation changes in the upper right hemithorax and right perihilar region. 4. No superimposed acute abnormality. Electronically Signed   By: Lorin Picket M.D.   On: 01/21/2023 10:46   CT Angio Chest Pulmonary Embolism (PE) W or WO Contrast  Result Date: 01/17/2023 CLINICAL DATA:  COPD, history of metastatic lung cancer, worsening dyspnea and cough EXAM: CT ANGIOGRAPHY CHEST WITH CONTRAST TECHNIQUE: Multidetector CT imaging of the chest was performed using the standard protocol during bolus administration of intravenous contrast. Multiplanar CT  image reconstructions and MIPs were obtained to evaluate the vascular anatomy. RADIATION DOSE REDUCTION: This exam was performed according to the departmental dose-optimization program which includes automated exposure control, adjustment of the mA and/or kV according to patient size and/or use of iterative reconstruction technique. CONTRAST:  34m OMNIPAQUE IOHEXOL 350 MG/ML SOLN COMPARISON:  01/11/2023 FINDINGS: Cardiovascular: This is a technically adequate evaluation of the pulmonary vasculature. No filling defects or pulmonary emboli. The heart is unremarkable without pericardial effusion. No evidence of thoracic aortic aneurysm or dissection. Stable atherosclerosis of the aorta and coronary vasculature. 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 Mediastinum/Nodes: Loss of normal fat planes throughout the mediastinum may reflect post therapeutic change or sequela of metastatic disease. Discrete adenopathy or mass is difficult to measure, but the appearance is stable since the previous evaluation. Thyroid, trachea, and esophagus are grossly unremarkable. Lungs/Pleura: Chronic areas of consolidation within the bilateral lower  lobes are again noted and unchanged. Extensive pleural nodularity again noted bilaterally, right greater than left. Chronic scarring and bronchovascular thickening within the right hilar region unchanged. Index nodules measured previously are as follows: Left upper lobe, image 50/6, 8 mm.  Previously 7 mm. Right lower lobe, image 103/6, 16 mm.  Previously 14 mm. Right middle lobe, image 103/6, 13 mm.  Previously 12 mm. Left upper lobe, image 99/6, 13 mm.  Previously 12 mm. Trace bilateral pleural effusions are again noted.  No pneumothorax. Upper Abdomen: No acute abnormality. Musculoskeletal: Stable sclerotic metastatic disease within the bilateral scapula, sternum,  and thoracic spine. There are no acute displaced fractures. Reconstructed images demonstrate no additional findings. Review of the MIP images confirms the above findings. IMPRESSION: 1. No evidence of pulmonary embolus. 2. Findings consistent with known metastatic lung cancer, with slight interval increase in size of the pleural and parenchymal nodularity seen previously. 3. Stable diffuse sclerotic bony metastases. 4.  Aortic Atherosclerosis (ICD10-I70.0). Electronically Signed   By: Randa Ngo M.D.   On: 01/17/2023 16:37   ECHOCARDIOGRAM COMPLETE  Result Date: 01/16/2023    ECHOCARDIOGRAM REPORT   Patient Name:   GEORGIANA EILER Date of Exam: 01/16/2023 Medical Rec #:  [id number]        Height:       71.0 in Accession #:    [id number]       Weight:       133.8 lb Date of Birth:  Jan 30, 1961        BSA:          1.778 m Patient Age:    21 years         BP:           129/71 mmHg Patient Gender: F                HR:           110 bpm. Exam Location:  [facility] Procedure: 2D Echo, Color Doppler and Cardiac Doppler Indications:    Acute respiratory distress R06.03  History:        Patient has no prior history of Echocardiogram examinations.                 COPD.  Sonographer:    Sherrie Sport Referring Phys: [id number] [facility] A GRIFFITH Diagnosing      Kate Sable MD Phys:  Sonographer Comments: Image acquisition challenging due to patient body habitus. IMPRESSIONS  1. Left ventricular ejection fraction, by estimation, is 60 to 65%. The left ventricle has normal function. The left ventricle has no regional wall motion abnormalities. Left ventricular diastolic parameters are consistent with Grade I diastolic dysfunction (impaired relaxation).  2. Right ventricular systolic function is normal. The right ventricular size is normal.  3. The mitral valve is normal in structure. No evidence of mitral valve regurgitation.  4. The aortic valve is tricuspid. Aortic valve regurgitation is not visualized. FINDINGS  Left  Ventricle: Left ventricular ejection fraction, by estimation, is 60 to 65%. The left ventricle has normal function. The left ventricle has no regional wall motion abnormalities. The left ventricular internal cavity size was normal in size. There is  no left ventricular hypertrophy. Left ventricular diastolic parameters are consistent with Grade I diastolic dysfunction (impaired relaxation). Right Ventricle: The right ventricular size is normal. No increase in right ventricular wall thickness. Right ventricular systolic function is normal. Left Atrium: Left atrial size was normal in size. Right Atrium: Right atrial size was  normal in size. Pericardium: There is no evidence of pericardial effusion. Mitral Valve: The mitral valve is normal in structure. No evidence of mitral valve regurgitation. Tricuspid Valve: The tricuspid valve is normal in structure. Tricuspid valve regurgitation is not demonstrated. Aortic Valve: The aortic valve is tricuspid. Aortic valve regurgitation is not visualized. Aortic valve mean gradient measures 3.0 mmHg. Aortic valve peak gradient measures 4.8 mmHg. Aortic valve area, by VTI measures 2.30 cm. Pulmonic Valve: The pulmonic valve was normal in structure. Pulmonic valve regurgitation is not visualized. Aorta: The aortic root is normal in size and structure. Venous: The inferior vena cava was not well visualized. IAS/Shunts: No atrial level shunt detected by color flow Doppler.  LEFT VENTRICLE PLAX 2D LVIDd:         3.20 cm   Diastology LVIDs:         1.90 cm   LV e' medial:    7.07 cm/s LV PW:         0.90 cm   LV E/e' medial:  9.1 LV IVS:        1.10 cm   LV e' lateral:   9.25 cm/s LVOT diam:     1.90 cm   LV E/e' lateral: 7.0 LV SV:         37 LV SV Index:   21 LVOT Area:     2.84 cm  RIGHT VENTRICLE RV Basal diam:  3.60 cm RV Mid diam:    2.70 cm RV S prime:     18.40 cm/s TAPSE (M-mode): 2.1 cm LEFT ATRIUM           Index       RIGHT ATRIUM          Index LA diam:      1.50 cm 0.84  cm/m  RA Area:     9.50 cm LA Vol (A2C): 6.9 ml  3.88 ml/m  RA Volume:   17.30 ml 9.73 ml/m LA Vol (A4C): 10.0 ml 5.63 ml/m  AORTIC VALVE AV Area (Vmax):    2.35 cm AV Area (Vmean):   2.05 cm AV Area (VTI):     2.30 cm AV Vmax:           109.00 cm/s AV Vmean:          76.900 cm/s AV VTI:            0.160 m AV Peak Grad:      4.8 mmHg AV Mean Grad:      3.0 mmHg LVOT Vmax:         90.20 cm/s LVOT Vmean:        55.600 cm/s LVOT VTI:          0.130 m LVOT/AV VTI ratio: 0.81  AORTA Ao Root diam: 3.20 cm MITRAL VALVE               TRICUSPID VALVE MV Area (PHT): 6.90 cm    TR Peak grad:   31.6 mmHg MV Decel Time: 110 msec    TR Vmax:        281.00 cm/s MV E velocity: 64.50 cm/s MV A velocity: 96.90 cm/s  SHUNTS MV E/A ratio:  0.67        Systemic VTI:  0.13 m                            Systemic Diam: 1.90 cm Kate Sable MD Electronically signed by Kate Sable  MD Signature Date/Time: 01/16/2023/4:29:24 PM    Final     PERFORMANCE STATUS (ECOG) : 1 - Symptomatic but completely ambulatory  Review of Systems Unless otherwise noted, a complete review of systems is negative.  Physical Exam General: NAD Cardiovascular: regular rate and rhythm Pulmonary: some wheezing, diminished L side Abdomen: soft, nontender, + bowel sounds GU: no suprapubic tenderness Extremities: no edema, no joint deformities Skin: no rashes Neurological: Weakness but otherwise nonfocal  IMPRESSION/PLAN: Stage IV NSCLC -patient on second line Lumakras.  February CT showed slight progression.  There are other alternative treatment lines available if needed such as chemo/immunotherapy.  Patient will see Dr. [name] next month.  Dehydration -Labs improved.  No need for IV fluids today.  Return next week for repeat labs and possible fluids  Neoplasm related pain -stable on OxyContin with as needed oxycodone IR.  Discussed daily bowel regimen.  Weakness -daughters requested increased support at home as she is often not home  due to her work schedule.  Patient has Medicaid with Medicare pending next month.  I question if pace would be a good option versus Medicaid PCS or.  Will Refer to Social Work  Goals of care -daughter verbalized concern that patient has missed doses of the lumakras in addition to other prescribed medications.  Daughter says that patient is finding medications scattered all through the house and hidden in food.  Addressed this with patient but she adamantly denies and states that she is taking medications as directed with very few exceptions.  Patient says that she remains committed to current scope of treatment.   Patient expressed understanding and was in agreement with this plan. She also understands that She can call clinic at any time with any questions, concerns, or complaints.   Thank you for allowing me to participate in the care of this very pleasant patient.   Time Total: 20 minutes  Visit consisted of counseling and education dealing with the complex and emotionally intense issues of symptom management in the setting of serious illness.Greater than 50%  of this time was spent counseling and coordinating care related to the above assessment and plan.  Signed by: Altha Harm, PhD, NP-C

## 2023-02-15 NOTE — Addendum Note (Signed)
Addended by: Altha Harm R on: 02/15/2023 12:07 PM   Modules accepted: Orders

## 2023-02-15 NOTE — Telephone Encounter (Signed)
Brittney Fisher addressed these concerns in person at today's visit.

## 2023-02-15 NOTE — Progress Notes (Signed)
Unable to obtain IV site today. Labs improved, per Billey Chang NP pt doesn't need fluids today. Pt agreed, has appt to return 02/22/23 for labs and IVF

## 2023-02-19 ENCOUNTER — Other Ambulatory Visit: Payer: Self-pay | Admitting: *Deleted

## 2023-02-19 ENCOUNTER — Inpatient Hospital Stay: Payer: Medicaid Other | Admitting: Licensed Clinical Social Worker

## 2023-02-19 DIAGNOSIS — C770 Secondary and unspecified malignant neoplasm of lymph nodes of head, face and neck: Secondary | ICD-10-CM

## 2023-02-19 NOTE — Progress Notes (Signed)
[place] [facility]  Initial Assessment   MIRCLE FEDRICK is a 62 y.o. year old female contacted caregiver by phone. Clinical Social Work was referred by medical provider for assessment of psychosocial needs.   SDOH (Social Determinants of Health) assessments performed: Yes SDOH Interventions    Flowsheet Row Clinical Support from 02/19/2023 in [place] [facility] Visit from 12/21/2022 in [place] [facility] Interventions    Transportation Interventions -- Intervention Not Indicated  Alcohol Usage Interventions Intervention Not Indicated (Score <7) --  Financial Strain Interventions Intervention Not Indicated --  Physical Activity Interventions Intervention Not Indicated --  Stress Interventions Provide Counseling --  Social Connections Interventions Intervention Not Indicated --       SDOH Screenings   Food Insecurity: No Food Insecurity (01/12/2023)  Housing: Low Risk  (01/12/2023)  Transportation Needs: No Transportation Needs (01/12/2023)  Utilities: Not At Risk (01/12/2023)  Alcohol Screen: Low Risk  (02/19/2023)  Depression (PHQ2-9): Low Risk  (02/19/2023)  Financial Resource Strain: Low Risk  (02/19/2023)  Physical Activity: Inactive (02/19/2023)  Social Connections: Moderately Integrated (02/19/2023)  Stress: Stress Concern Present (02/19/2023)  Tobacco Use: Medium Risk (02/06/2023)     Distress Screen completed: No     No data to display            Family/Social Information:  Housing Arrangement: patient lives with niece,  Maryelizabeth Rowan) [phone]  Family members/support persons in your life? Family, Friends, and Education administrator, Geophysical data processor concerns: no  Employment: Retired  .  Income source: Paediatric nurse concerns: No Type of concern: None Food access concerns: no Religious or spiritual practice: Hydrographic surveyor Currently in place:  Medicaid, Medicare  will be active in April 2024  Coping/ Adjustment to diagnosis: Patient understands treatment plan and what happens next? yes, patient is exhibiting increasing confusion Concerns about diagnosis and/or treatment: Quality of life and Caregiving support    Patient reported stressors: Childcare/ elder care, Adjusting to my illness, and Physical issues Hopes and/or priorities: caregiving assistance Patient enjoys  N/A Current coping skills/ strengths: Average or above average intelligence , Financial means , and Supportive family/friends     SUMMARY: Current SDOH Barriers:  ADL IADL limitations, Mental Health Concerns , Limited access to caregiver, Cognitive Deficits, and Memory Deficits  Clinical Social Work Clinical Goal(s):  Caregiver will contact Medicaid caseworker, who they have worked with in the past, to request information on CAP program and Publishing rights manager assistance. Discussed in detail what Medicaid covers for caregiving assistance, including long-term care placement. Discussed what Medicare covers and palliative and hospice care.  Interventions: Discussed common feeling and emotions when being diagnosed with cancer, and the importance of support during treatment Informed patient of the support team roles and support services at [facility] Provided CSW contact information and encouraged patient to call with any questions or concerns Referred patient to Medicaid, and explained CSW role in patient care and other available resources.  CSW spoke with patient's caregiver Reita Chard [facility], about counseling availability for the patient and for the family and caregivers.  Ms. Ronnald Ramp stated she will speak with the patient and contact if they want to schedule an appointment with CSW    Follow Up Plan: Patient will contact CSW with any support or resource needs Patient verbalizes understanding of plan: Yes    Adelene Amas, LCSW   Patient is participating in a Managed Medicaid Plan:   Yes

## 2023-02-20 ENCOUNTER — Inpatient Hospital Stay (HOSPITAL_BASED_OUTPATIENT_CLINIC_OR_DEPARTMENT_OTHER): Payer: Medicaid Other | Admitting: Hospice and Palliative Medicine

## 2023-02-20 ENCOUNTER — Inpatient Hospital Stay: Payer: Medicaid Other | Admitting: Occupational Therapy

## 2023-02-20 DIAGNOSIS — Z515 Encounter for palliative care: Secondary | ICD-10-CM

## 2023-02-20 DIAGNOSIS — R531 Weakness: Secondary | ICD-10-CM

## 2023-02-20 DIAGNOSIS — C770 Secondary and unspecified malignant neoplasm of lymph nodes of head, face and neck: Secondary | ICD-10-CM

## 2023-02-20 DIAGNOSIS — M898X5 Other specified disorders of bone, thigh: Secondary | ICD-10-CM

## 2023-02-20 NOTE — Progress Notes (Signed)
Virtual Visit via Telephone Note  I connected with Brittney Fisher on 02/20/23 at  2:20 PM EST by telephone and verified that I am speaking with the correct person using two identifiers.  Location: Patient: Home Provider: Clinic   I discussed the limitations, risks, security and privacy concerns of performing an evaluation and management service by telephone and the availability of in person appointments. I also discussed with the patient that there may be a patient responsible charge related to this service. The patient expressed understanding and agreed to proceed.   History of Present Illness: Brittney Fisher is a 62 y.o. 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  CT of the chest, abdomen, and pelvis on 12/24/2022 shows bilateral peripheral pulm nodules mildly increased in size with bilateral small pleural effusions. Patient has stable sclerotic metastasis in spine and pelvis with a lytic lesion in the right femoral neck concerning for possible impending pathologic fracture.  Patient underwent surgical pinning of the right femur on 01/01/2023.  She was hospitalized 01/12/2023 to 01/18/2023 for COPD exacerbation.  Palliative care was consulted to address goals and manage ongoing symptoms.    Observations/Objective: I called and spoke with patient by phone.  Patient reports that she feels "fine".  She thinks she is doing reasonably well and denies any acute changes or concerns.  No symptomatic complaints at present.  Patient reports that occasionally she has difficulty swallowing her pills but plans to try to take those with food.  She denies any oral sores or white patches.  Patient reports stable pain.  Assessment and Plan: Stage IV non-small cell lung cancer -patient was restarted on second line Lumakras.  Patient returns to clinic tomorrow for  labs/fluids and weekly for supportive care.   Weakness -patient has talked with social work about Medicaid PCS  Neoplasm related pain -stable. continue oxycodone/OxyContin  Follow Up Instructions: Follow-up telephone visit 1 to 2 months   I discussed the assessment and treatment plan with the patient. The patient was provided an opportunity to ask questions and all were answered. The patient agreed with the plan and demonstrated an understanding of the instructions.   The patient was advised to call back or seek an in-person evaluation if the symptoms worsen or if the condition fails to improve as anticipated.  I provided 10 minutes of non-face-to-face time during this encounter.   [name], NP

## 2023-02-20 NOTE — Therapy (Signed)
Brittney Fisher at [facility] [address], [place], [place], [address] Phone: [phone]   Fax:  [phone]  Occupational Therapy Screen  Patient Details  Name: Brittney Fisher MRN: [id number] Date of Birth: Jun 28, 1961 No data recorded  Encounter Date: 02/20/2023   OT End of Session - 02/20/23 1817     Visit Number 0             Past Medical History:  Diagnosis Date   COPD (chronic obstructive pulmonary disease) ([place])    GERD (gastroesophageal reflux disease)    Metastatic lung cancer (metastasis from lung to other site) ([place]) 11/02/2020    Past Surgical History:  Procedure Laterality Date   COLONOSCOPY WITH ESOPHAGOGASTRODUODENOSCOPY (EGD)     COLONOSCOPY WITH PROPOFOL N/A 11/14/2015   Procedure: COLONOSCOPY WITH PROPOFOL;  Surgeon: Hulen Luster, MD;  Location: [facility] ENDOSCOPY;  Service: Gastroenterology;  Laterality: N/A;   ESOPHAGOGASTRODUODENOSCOPY (EGD) WITH PROPOFOL N/A 11/14/2015   Procedure: ESOPHAGOGASTRODUODENOSCOPY (EGD) WITH PROPOFOL;  Surgeon: Hulen Luster, MD;  Location: [facility] ENDOSCOPY;  Service: Gastroenterology;  Laterality: N/A;   FEMUR IM NAIL Right 01/01/2023   Procedure: TREATMENT, INTERTROCHANTERIC FEMORAL FRACTURE, WITH INTRAMEDULLARY IMPLANT (SURG);  Surgeon: Lovell Sheehan, MD;  Location: [facility] ORS;  Service: Orthopedics;  Laterality: Right;    There were no vitals filed for this visit.   Subjective Assessment - 02/20/23 1815     Subjective  I had hip surgery middle January -was suppose to get [facility] but could never get it - it is now 6 wks and I cannot walk and don't know what exercises to do - my R hip hurts    Currently in Pain? Yes    Pain Score 7     Pain Location --   R hip and lower back   Pain Orientation Right              02/15/23 Brittney Pew Borders NP: IMPRESSION/PLAN: Stage IV NSCLC -patient on second line Lumakras.  February CT showed slight progression.  There are other alternative treatment  lines available if needed such as chemo/immunotherapy.  Patient will see Dr. [name] next month.   Dehydration -Labs improved.  No need for IV fluids today.  Return next week for repeat labs and possible fluids   Neoplasm related pain -stable on OxyContin with as needed oxycodone IR.  Discussed daily bowel regimen.   Weakness -daughters requested increased support at home as she is often not home due to her work schedule.  Patient has Medicaid with Medicare pending next month.  I question if pace would be a good option versus Medicaid PCS or.  Will Refer to Social Work   Goals of care -daughter verbalized concern that patient has missed doses of the lumakras in addition to other prescribed medications.  Daughter says that patient is finding medications scattered all through the house and hidden in food.  Addressed this with patient but she adamantly denies and states that she is taking medications as directed with very few exceptions.  Patient says that she remains committed to current scope of treatment.       OT SCREEN 02/20/23: Patient arrived in wheelchair with niece.  Patient reported living with her mother and sister one-story house.  Walk-in shower with a seat.  2 rails to hold onto with 2 steps to enter. Patient reported mostly weakness as well as pain in the right hip and lower back.  Patient had a right hip ORIF  middle January and was unable to get home health.  Patient did not had any therapy since being in the hospital with surgery. Patient with weakness and pain in right hip and leg. Patient denies any falls. Patient do have appointment this Friday with orthopedic surgery and-reinforced with patient and niece to pursue outpatient PT for strengthening. Reviewed with patient sleeping positioning to decrease pain and discomfort Provided patient with a few home exercises to start doing. -In supine patient can do heel slides -Hip adduction with pillow between knees 10 reps pain-free  In  setting patient can practice with 1 or 2 pillows in the chair sit and stand with walker -Marching in sitting 10 reps -Abduction of knees with pillow 10 reps  Patient report afterwards less pain and tightness in the hip was able to sit more evenly with weight distributed Reinforced with patient and family again to initiate physical therapy after appointment Friday with orthopedic.   Patient to follow-up with me in 4 weeks if needed                                   Visit Diagnosis: Pain in right femur  Weakness    Problem List Patient Active Problem List   Diagnosis Date Noted   Chronic respiratory failure with hypoxia ([place]) 01/28/2023   Palliative care encounter 01/16/2023   Pathological fracture of right femur ([place]) 01/16/2023   Hypokalemia 01/15/2023   Hypomagnesemia 01/13/2023   Anxiety 01/12/2023   Peripheral neuropathy 01/12/2023   Generalized abdominal pain 01/12/2023   Hyponatremia 01/12/2023   Abnormal urinalysis 01/12/2023   COPD exacerbation ([place]) 01/11/2023   Impending pathologic fracture 01/01/2023   Insomnia 07/19/2022   Dysuria 06/22/2022   Malignant neoplasm metastatic to bone ([place]) 07/28/2021   Anemia 01/09/2021   Odynophagia 01/09/2021   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   Encounter for antineoplastic chemotherapy 11/25/2020   Tachycardia 11/25/2020   Neoplasm related pain 11/25/2020   Primary malignant neoplasm of lung metastatic to other site [facility]) 11/04/2020   Metastatic lung cancer (metastasis from lung to other site) ([place]) 11/02/2020   Weight loss 10/28/2020   Goals of care, counseling/discussion 10/28/2020   Malnutrition of moderate degree 10/22/2020   Pleural effusion 10/20/2020   Malignant pleural effusion    COPD with acute exacerbation ([facility])    Dizziness    Metastatic malignant neoplasm ([place])    Shortness of breath    S/P thoracentesis    Lung mass     Brittney Fisher, OTR/L,CLT 02/20/2023, 6:17 PM  [place] at [facility] [address] [place], [place], [address] Phone: [phone]   Fax:  [phone]  Name: Brittney Fisher MRN: [id number] Date of Birth: [birth year]

## 2023-02-21 ENCOUNTER — Inpatient Hospital Stay: Payer: Medicaid Other

## 2023-02-21 ENCOUNTER — Encounter: Payer: Self-pay | Admitting: Occupational Therapy

## 2023-02-21 VITALS — BP 113/72 | HR 115 | Temp 97.4°F | Resp 22

## 2023-02-21 DIAGNOSIS — C770 Secondary and unspecified malignant neoplasm of lymph nodes of head, face and neck: Secondary | ICD-10-CM

## 2023-02-21 DIAGNOSIS — C3490 Malignant neoplasm of unspecified part of unspecified bronchus or lung: Secondary | ICD-10-CM

## 2023-02-21 DIAGNOSIS — E860 Dehydration: Secondary | ICD-10-CM | POA: Diagnosis not present

## 2023-02-21 LAB — CBC WITH DIFFERENTIAL (CANCER CENTER ONLY)
Abs Immature Granulocytes: 0.02 10*3/uL (ref 0.00–0.07)
Basophils Absolute: 0 10*3/uL (ref 0.0–0.1)
Basophils Relative: 0 %
Eosinophils Absolute: 0 10*3/uL (ref 0.0–0.5)
Eosinophils Relative: 0 %
HCT: 34.8 % — ABNORMAL LOW (ref 36.0–46.0)
Hemoglobin: 10.6 g/dL — ABNORMAL LOW (ref 12.0–15.0)
Immature Granulocytes: 0 %
Lymphocytes Relative: 5 %
Lymphs Abs: 0.4 10*3/uL — ABNORMAL LOW (ref 0.7–4.0)
MCH: 27 pg (ref 26.0–34.0)
MCHC: 30.5 g/dL (ref 30.0–36.0)
MCV: 88.5 fL (ref 80.0–100.0)
Monocytes Absolute: 0.7 10*3/uL (ref 0.1–1.0)
Monocytes Relative: 10 %
Neutro Abs: 6 10*3/uL (ref 1.7–7.7)
Neutrophils Relative %: 85 %
Platelet Count: 191 10*3/uL (ref 150–400)
RBC: 3.93 MIL/uL (ref 3.87–5.11)
RDW: 14.9 % (ref 11.5–15.5)
WBC Count: 7.1 10*3/uL (ref 4.0–10.5)
nRBC: 0 % (ref 0.0–0.2)

## 2023-02-21 LAB — CMP (CANCER CENTER ONLY)
ALT: 14 U/L (ref 0–44)
AST: 21 U/L (ref 15–41)
Albumin: 2.9 g/dL — ABNORMAL LOW (ref 3.5–5.0)
Alkaline Phosphatase: 184 U/L — ABNORMAL HIGH (ref 38–126)
Anion gap: 9 (ref 5–15)
BUN: 16 mg/dL (ref 8–23)
CO2: 25 mmol/L (ref 22–32)
Calcium: 8.2 mg/dL — ABNORMAL LOW (ref 8.9–10.3)
Chloride: 97 mmol/L — ABNORMAL LOW (ref 98–111)
Creatinine: 0.48 mg/dL (ref 0.44–1.00)
GFR, Estimated: >60 mL/min (ref >60)
Glucose, Bld: 171 mg/dL — ABNORMAL HIGH (ref 70–99)
Potassium: 3.9 mmol/L (ref 3.5–5.1)
Sodium: 131 mmol/L — ABNORMAL LOW (ref 135–145)
Total Bilirubin: 0.2 mg/dL — ABNORMAL LOW (ref 0.3–1.2)
Total Protein: 6.6 g/dL (ref 6.5–8.1)

## 2023-02-21 MED ORDER — SODIUM CHLORIDE 0.9 % IV SOLN
Freq: Once | INTRAVENOUS | Status: AC
  Filled 2023-02-21: qty 250

## 2023-02-22 ENCOUNTER — Other Ambulatory Visit: Payer: Medicaid Other

## 2023-02-22 ENCOUNTER — Ambulatory Visit: Payer: Medicaid Other

## 2023-02-22 ENCOUNTER — Telehealth: Payer: Self-pay | Admitting: *Deleted

## 2023-02-22 NOTE — Telephone Encounter (Signed)
I called and spoke to the niece and asked her what was going on. She said she is having little wheezing and she told her that she needs to use her inhaler and then she can have nebulizer and she told the niece that she is needing [facility] visit. I told Niece that she recently saw Kosh and he says to use the inhaler and the  then can use nebulilzer that is what it is made for . Also told niece and let her know that there are no more openings for [facility] today. Niece says she will tell the pt. That Merrily Pew says to do that. She says that the patient is not in a emergency for the pt. And I told her to call back if it does not help. Se is agreeable to this

## 2023-02-22 NOTE — Telephone Encounter (Signed)
The niece called again saying that she feels worse now. The niece said that she is about the same. The pt. Thinks she will get worse over the weekend and she wants to be seen. I told the niece that the slots for [facility] was filled up and they could try PCP office or go to ER. Niece said she already told the pt. That and thanked me for calling back

## 2023-02-28 ENCOUNTER — Inpatient Hospital Stay: Payer: Medicaid Other

## 2023-02-28 ENCOUNTER — Other Ambulatory Visit: Payer: Self-pay | Admitting: *Deleted

## 2023-02-28 VITALS — BP 99/68 | HR 113 | Resp 20

## 2023-02-28 DIAGNOSIS — E860 Dehydration: Secondary | ICD-10-CM | POA: Diagnosis not present

## 2023-02-28 DIAGNOSIS — C3490 Malignant neoplasm of unspecified part of unspecified bronchus or lung: Secondary | ICD-10-CM

## 2023-02-28 DIAGNOSIS — C770 Secondary and unspecified malignant neoplasm of lymph nodes of head, face and neck: Secondary | ICD-10-CM

## 2023-02-28 DIAGNOSIS — L98429 Non-pressure chronic ulcer of back with unspecified severity: Secondary | ICD-10-CM

## 2023-02-28 LAB — CMP (CANCER CENTER ONLY)
ALT: 13 U/L (ref 0–44)
AST: 17 U/L (ref 15–41)
Albumin: 2.8 g/dL — ABNORMAL LOW (ref 3.5–5.0)
Alkaline Phosphatase: 164 U/L — ABNORMAL HIGH (ref 38–126)
Anion gap: 8 (ref 5–15)
BUN: 12 mg/dL (ref 8–23)
CO2: 29 mmol/L (ref 22–32)
Calcium: 8.5 mg/dL — ABNORMAL LOW (ref 8.9–10.3)
Chloride: 97 mmol/L — ABNORMAL LOW (ref 98–111)
Creatinine: 0.41 mg/dL — ABNORMAL LOW (ref 0.44–1.00)
GFR, Estimated: >60 mL/min (ref >60)
Glucose, Bld: 104 mg/dL — ABNORMAL HIGH (ref 70–99)
Potassium: 3.6 mmol/L (ref 3.5–5.1)
Sodium: 134 mmol/L — ABNORMAL LOW (ref 135–145)
Total Bilirubin: 0.3 mg/dL (ref 0.3–1.2)
Total Protein: 6.5 g/dL (ref 6.5–8.1)

## 2023-02-28 LAB — CBC WITH DIFFERENTIAL (CANCER CENTER ONLY)
Abs Immature Granulocytes: 0.01 10*3/uL (ref 0.00–0.07)
Basophils Absolute: 0 10*3/uL (ref 0.0–0.1)
Basophils Relative: 0 %
Eosinophils Absolute: 0 10*3/uL (ref 0.0–0.5)
Eosinophils Relative: 0 %
HCT: 35 % — ABNORMAL LOW (ref 36.0–46.0)
Hemoglobin: 10.9 g/dL — ABNORMAL LOW (ref 12.0–15.0)
Immature Granulocytes: 0 %
Lymphocytes Relative: 11 %
Lymphs Abs: 0.6 10*3/uL — ABNORMAL LOW (ref 0.7–4.0)
MCH: 27.6 pg (ref 26.0–34.0)
MCHC: 31.1 g/dL (ref 30.0–36.0)
MCV: 88.6 fL (ref 80.0–100.0)
Monocytes Absolute: 0.5 10*3/uL (ref 0.1–1.0)
Monocytes Relative: 9 %
Neutro Abs: 4.2 10*3/uL (ref 1.7–7.7)
Neutrophils Relative %: 80 %
Platelet Count: 194 10*3/uL (ref 150–400)
RBC: 3.95 MIL/uL (ref 3.87–5.11)
RDW: 14.6 % (ref 11.5–15.5)
WBC Count: 5.3 10*3/uL (ref 4.0–10.5)
nRBC: 0 % (ref 0.0–0.2)

## 2023-02-28 MED ORDER — SODIUM CHLORIDE 0.9 % IV SOLN
Freq: Once | INTRAVENOUS | Status: AC
  Filled 2023-02-28: qty 250

## 2023-02-28 NOTE — Patient Instructions (Addendum)
We applied a Mepilex Border Sacrum dressings to your backside today. This dressing prevents pressure ulcers from forming and further skin breakdown.  We recommend that you apply barrier cream to the pressure ulcer. This is available over the counter and helps prevent damage from external factors such as moisture, irritants, and friction. These creams are typically thick and rich in texture, and they are formulated with ingredients that help to seal in moisture and prevent it from penetrating the skin.  Stay off area and remove all pressure. Keep the area clean and dry. Eat adequate calories high in protein, vitamins (especially A and C) and minerals (especially iron and zinc). Drink more water. Ambulate as much as possible  If the skin breakdown becomes worse, we can consult the wound care department. Please keep [place] updated on the wound and skin breakdown.

## 2023-02-28 NOTE — Progress Notes (Signed)
Family reports skin breakdown on sacral area - the start of a pressure ulcer.  Magda Paganini, RN and myself examined the patient and noted stage 1 ulcer on sacrum. We applied a Mepilex Border dressing to the sacrum and provided wound care education to patient/family. If there is further breakdown in skin/pressure ulcers, we discussed with Josh, NP the potential need for a referral to wound care in the near future.

## 2023-03-01 ENCOUNTER — Ambulatory Visit: Payer: Medicaid Other

## 2023-03-01 ENCOUNTER — Other Ambulatory Visit: Payer: Medicaid Other

## 2023-03-02 ENCOUNTER — Emergency Department: Payer: Medicaid Other

## 2023-03-02 ENCOUNTER — Other Ambulatory Visit: Payer: Self-pay

## 2023-03-02 ENCOUNTER — Emergency Department
Admission: EM | Admit: 2023-03-02 | Discharge: 2023-03-02 | Disposition: A | Discharge status: Home / Self Care | Payer: Medicaid Other | Attending: Student in an Organized Health Care Education/Training Program | Admitting: Student in an Organized Health Care Education/Training Program

## 2023-03-02 DIAGNOSIS — Z85118 Personal history of other malignant neoplasm of bronchus and lung: Secondary | ICD-10-CM | POA: Diagnosis not present

## 2023-03-02 DIAGNOSIS — R0600 Dyspnea, unspecified: Secondary | ICD-10-CM

## 2023-03-02 DIAGNOSIS — Z20822 Contact with and (suspected) exposure to covid-19: Secondary | ICD-10-CM | POA: Diagnosis not present

## 2023-03-02 DIAGNOSIS — R0602 Shortness of breath: Secondary | ICD-10-CM | POA: Diagnosis present

## 2023-03-02 DIAGNOSIS — J449 Chronic obstructive pulmonary disease, unspecified: Secondary | ICD-10-CM | POA: Insufficient documentation

## 2023-03-02 LAB — CBC
HCT: 35.5 % — ABNORMAL LOW (ref 36.0–46.0)
Hemoglobin: 11 g/dL — ABNORMAL LOW (ref 12.0–15.0)
MCH: 27.7 pg (ref 26.0–34.0)
MCHC: 31 g/dL (ref 30.0–36.0)
MCV: 89.4 fL (ref 80.0–100.0)
Platelets: 218 10*3/uL (ref 150–400)
RBC: 3.97 MIL/uL (ref 3.87–5.11)
RDW: 14.8 % (ref 11.5–15.5)
WBC: 7.1 10*3/uL (ref 4.0–10.5)
nRBC: 0 % (ref 0.0–0.2)

## 2023-03-02 LAB — RESP PANEL BY RT-PCR (RSV, FLU A&B, COVID)  RVPGX2
Influenza A by PCR: NEGATIVE
Influenza B by PCR: NEGATIVE
Resp Syncytial Virus by PCR: NEGATIVE
SARS Coronavirus 2 by RT PCR: NEGATIVE

## 2023-03-02 LAB — BASIC METABOLIC PANEL
Anion gap: 6 (ref 5–15)
BUN: 13 mg/dL (ref 8–23)
CO2: 30 mmol/L (ref 22–32)
Calcium: 8.5 mg/dL — ABNORMAL LOW (ref 8.9–10.3)
Chloride: 96 mmol/L — ABNORMAL LOW (ref 98–111)
Creatinine, Ser: 0.54 mg/dL (ref 0.44–1.00)
GFR, Estimated: >60 mL/min (ref >60)
Glucose, Bld: 140 mg/dL — ABNORMAL HIGH (ref 70–99)
Potassium: 3.7 mmol/L (ref 3.5–5.1)
Sodium: 132 mmol/L — ABNORMAL LOW (ref 135–145)

## 2023-03-02 MED ORDER — IPRATROPIUM-ALBUTEROL 0.5-2.5 (3) MG/3ML IN SOLN
3.0000 mL | Freq: Once | RESPIRATORY_TRACT | Status: AC
  Administered 2023-03-02: 3 mL via RESPIRATORY_TRACT
  Filled 2023-03-02: qty 3

## 2023-03-02 MED ORDER — PREDNISONE 20 MG PO TABS: 40.0000 mg | ORAL_TABLET | Freq: Once | ORAL | Status: DC

## 2023-03-02 MED ORDER — SODIUM CHLORIDE 0.9 % IV BOLUS: 1000.0000 mL | Freq: Once | INTRAVENOUS | Status: DC

## 2023-03-02 MED ORDER — PREDNISONE 20 MG PO TABS: 40.0000 mg | ORAL_TABLET | Freq: Every day | ORAL | 0 refills | Status: AC

## 2023-03-02 NOTE — ED Notes (Signed)
Report received, this RN now assuming care. 

## 2023-03-02 NOTE — ED Notes (Signed)
Pt refusing NS at this time, sts she wants to speak with the MD first.

## 2023-03-02 NOTE — ED Triage Notes (Signed)
Patient arrives reporting shortness of breath with ambulation. Patient uses oxygen at home at 2L. Reports she feels bad and has a wound on her lower back. Patient also reports pain in her lower right leg. Hx of CA.

## 2023-03-02 NOTE — ED Provider Notes (Signed)
[facility] Provider Note    Event Date/Time   First MD Initiated Contact with Patient 03/02/23 2044     (approximate)   History   Fatigue and Shortness of Breath   HPI  Brittney Fisher is a 62 y.o. female well-known to this facility with history of lung cancer as well as COPD on chronic oxygen also with history of anxiety presents to the ER for evaluation of shortness of breath particular when she gets up to go to the bathroom.  She denies any chest pain or pressure at this time.  No fevers or chills.  Feels like she is at her baseline currently.     Physical Exam   Triage Vital Signs: ED Triage Vitals  Enc Vitals Group     BP 03/02/23 2031 (!) 87/65     Pulse Rate 03/02/23 2031 (!) 132     Resp 03/02/23 2031 17     Temp 03/02/23 2031 98.4 F (36.9 C)     Temp Source 03/02/23 2031 Oral     SpO2 03/02/23 2031 100 %     Weight 03/02/23 2032 120 lb (54.4 kg)     Height 03/02/23 2032 5\' 11"  (1.803 m)     Head Circumference --      Peak Flow --      Pain Score 03/02/23 2032 9     Pain Loc --      Pain Edu? --      Excl. in [place]? --     Most recent vital signs: Vitals:   03/02/23 2130 03/02/23 2200  BP: 123/74 117/75  Pulse: (!) 113 (!) 115  Resp: (!) 21 15  Temp:    SpO2: 100% 99%     Constitutional: Alert, chronically ill appearing Eyes: Conjunctivae are normal.  Head: Atraumatic. Nose: No congestion/rhinnorhea. Mouth/Throat: Mucous membranes are moist.   Neck: Painless ROM.  Cardiovascular:   Good peripheral circulation. Respiratory: Diffuse wheeze but otherwise good air movement.  Speaking in complete sentences.   Gastrointestinal: Soft and nontender.  Musculoskeletal:  no deformity Neurologic:  MAE spontaneously. No gross focal neurologic deficits are appreciated.  Skin:  Skin is warm, dry and intact. No rash noted. Psychiatric: Mood and affect are normal. Speech and behavior are normal.    ED Results / Procedures /  Treatments   Labs (all labs ordered are listed, but only abnormal results are displayed) Labs Reviewed  BASIC METABOLIC PANEL - Abnormal; Notable for the following components:      Result Value   Sodium 132 (*)    Chloride 96 (*)    Glucose, Bld 140 (*)    Calcium 8.5 (*)    All other components within normal limits  CBC - Abnormal; Notable for the following components:   Hemoglobin 11.0 (*)    HCT 35.5 (*)    All other components within normal limits  RESP PANEL BY RT-PCR (RSV, FLU A&B, COVID)  [id number]  URINALYSIS, ROUTINE W REFLEX MICROSCOPIC     EKG  ED ECG REPORT I, Merlyn Lot, the attending physician, personally viewed and interpreted this ECG.   Date: 03/02/2023  EKG Time: 20:35  Rate: 125  Rhythm: sinus  Axis: normal  Intervals: normal  ST&T Change: no stemi, no depressions    RADIOLOGY Please see ED Course for my review and interpretation.  I personally reviewed all radiographic images ordered to evaluate for the above acute complaints and reviewed radiology reports and findings.  These findings  were personally discussed with the patient.  Please see medical record for radiology report.    PROCEDURES:  Critical Care performed: No  Procedures   MEDICATIONS ORDERED IN ED: Medications  sodium chloride 0.9 % bolus 1,000 mL (has no administration in time range)  ipratropium-albuterol (DUONEB) 0.5-2.5 (3) MG/3ML nebulizer solution 3 mL (3 mLs Nebulization Given 03/02/23 2206)     IMPRESSION / MDM / ASSESSMENT AND PLAN / ED COURSE  I reviewed the triage vital signs and the nursing notes.                              Differential diagnosis includes, but is not limited to, Asthma, copd, CHF, pna, ptx, malignancy, Pe, anemia  Patient presenting to the ER for evaluation of symptoms as described above.  Based on symptoms, risk factors and considered above differential, this presenting complaint could reflect a potentially life-threatening illness  therefore the patient will be placed on continuous pulse oximetry and telemetry for monitoring.  Laboratory evaluation will be sent to evaluate for the above complaints.  She clinically appears to be roughly at her baseline.  No respiratory distress.  No new hypoxia.    Clinical Course as of 03/02/23 2317  Sat Mar 02, 2023  2245 Chest x-ray on my review and interpretation without evidence of pneumothorax. [PR]  2306 Patient reassessed.  She felt improvement after nebulizer treatment.  She appears at her baseline.  I think there is a large component of anxiety with this we also discussed that she also has underlying COPD contributing to this in addition to her lung cancer.  She is quite frequently persistently tachycardic has had multiple workups for PE DVT all of which been negative.  She is not septic.  She is requesting discharge home which I think is reasonable.  Will send prescription for prednisone as I do think she has a component of COPD exacerbation but it would be fairly mild and I think that outpatient follow-up is reasonable.  IV fluids were ordered for her but she has declined these.  Blood pressure is normal. [PR]    Clinical Course User Index [PR] Merlyn Lot, MD     FINAL CLINICAL IMPRESSION(S) / ED DIAGNOSES   Final diagnoses:  Dyspnea, unspecified type     Rx / DC Orders   ED Discharge Orders          Ordered    predniSONE (DELTASONE) 20 MG tablet  Daily        03/02/23 2305             Note:  This document was prepared using Dragon voice recognition software and may include unintentional dictation errors.    Merlyn Lot, MD 03/02/23 2318

## 2023-03-02 NOTE — ED Notes (Signed)
Pt verbalized understanding of DC instructions. Signing pad did not work.   

## 2023-03-06 ENCOUNTER — Other Ambulatory Visit: Payer: Self-pay

## 2023-03-06 DIAGNOSIS — C3490 Malignant neoplasm of unspecified part of unspecified bronchus or lung: Secondary | ICD-10-CM

## 2023-03-07 ENCOUNTER — Ambulatory Visit
Admission: RE | Admit: 2023-03-07 | Discharge: 2023-03-07 | Disposition: A | Discharge status: Home / Self Care | Payer: Medicaid Other | Source: Ambulatory Visit | Attending: Oncology | Admitting: Oncology

## 2023-03-07 ENCOUNTER — Inpatient Hospital Stay (HOSPITAL_BASED_OUTPATIENT_CLINIC_OR_DEPARTMENT_OTHER): Payer: Medicaid Other | Admitting: Oncology

## 2023-03-07 ENCOUNTER — Encounter: Payer: Self-pay | Admitting: Oncology

## 2023-03-07 ENCOUNTER — Inpatient Hospital Stay: Payer: Medicaid Other

## 2023-03-07 ENCOUNTER — Telehealth: Payer: Self-pay

## 2023-03-07 VITALS — BP 95/63 | HR 120 | Temp 97.9°F | Resp 18 | Ht 71.0 in | Wt 102.2 lb

## 2023-03-07 DIAGNOSIS — C3490 Malignant neoplasm of unspecified part of unspecified bronchus or lung: Secondary | ICD-10-CM

## 2023-03-07 DIAGNOSIS — G893 Neoplasm related pain (acute) (chronic): Secondary | ICD-10-CM

## 2023-03-07 DIAGNOSIS — E860 Dehydration: Secondary | ICD-10-CM | POA: Diagnosis not present

## 2023-03-07 DIAGNOSIS — C7951 Secondary malignant neoplasm of bone: Secondary | ICD-10-CM | POA: Diagnosis not present

## 2023-03-07 DIAGNOSIS — R634 Abnormal weight loss: Secondary | ICD-10-CM

## 2023-03-07 DIAGNOSIS — Z7189 Other specified counseling: Secondary | ICD-10-CM

## 2023-03-07 DIAGNOSIS — Z5111 Encounter for antineoplastic chemotherapy: Secondary | ICD-10-CM | POA: Diagnosis not present

## 2023-03-07 DIAGNOSIS — J9611 Chronic respiratory failure with hypoxia: Secondary | ICD-10-CM

## 2023-03-07 DIAGNOSIS — R000 Tachycardia, unspecified: Secondary | ICD-10-CM

## 2023-03-07 LAB — CMP (CANCER CENTER ONLY)
ALT: 14 U/L (ref 0–44)
AST: 18 U/L (ref 15–41)
Albumin: 3.1 g/dL — ABNORMAL LOW (ref 3.5–5.0)
Alkaline Phosphatase: 165 U/L — ABNORMAL HIGH (ref 38–126)
Anion gap: 7 (ref 5–15)
BUN: 16 mg/dL (ref 8–23)
CO2: 30 mmol/L (ref 22–32)
Calcium: 8.7 mg/dL — ABNORMAL LOW (ref 8.9–10.3)
Chloride: 98 mmol/L (ref 98–111)
Creatinine: 0.48 mg/dL (ref 0.44–1.00)
GFR, Estimated: >60 mL/min (ref >60)
Glucose, Bld: 142 mg/dL — ABNORMAL HIGH (ref 70–99)
Potassium: 3.7 mmol/L (ref 3.5–5.1)
Sodium: 135 mmol/L (ref 135–145)
Total Bilirubin: 0.4 mg/dL (ref 0.3–1.2)
Total Protein: 7.2 g/dL (ref 6.5–8.1)

## 2023-03-07 LAB — CBC WITH DIFFERENTIAL (CANCER CENTER ONLY)
Abs Immature Granulocytes: 0.13 10*3/uL — ABNORMAL HIGH (ref 0.00–0.07)
Basophils Absolute: 0 10*3/uL (ref 0.0–0.1)
Basophils Relative: 0 %
Eosinophils Absolute: 0 10*3/uL (ref 0.0–0.5)
Eosinophils Relative: 0 %
HCT: 38 % (ref 36.0–46.0)
Hemoglobin: 11.7 g/dL — ABNORMAL LOW (ref 12.0–15.0)
Immature Granulocytes: 3 %
Lymphocytes Relative: 7 %
Lymphs Abs: 0.4 10*3/uL — ABNORMAL LOW (ref 0.7–4.0)
MCH: 27.3 pg (ref 26.0–34.0)
MCHC: 30.8 g/dL (ref 30.0–36.0)
MCV: 88.6 fL (ref 80.0–100.0)
Monocytes Absolute: 0.1 10*3/uL (ref 0.1–1.0)
Monocytes Relative: 3 %
Neutro Abs: 4.4 10*3/uL (ref 1.7–7.7)
Neutrophils Relative %: 87 %
Platelet Count: 220 10*3/uL (ref 150–400)
RBC: 4.29 MIL/uL (ref 3.87–5.11)
RDW: 14.6 % (ref 11.5–15.5)
WBC Count: 5 10*3/uL (ref 4.0–10.5)
nRBC: 0 % (ref 0.0–0.2)

## 2023-03-07 MED ORDER — DRONABINOL 5 MG PO CAPS: 5.0000 mg | ORAL_CAPSULE | Freq: Two times a day (BID) | ORAL | 0 refills | Status: DC

## 2023-03-07 MED ORDER — IOHEXOL 300 MG/ML  SOLN
80.0000 mL | Freq: Once | INTRAMUSCULAR | Status: AC | PRN
  Administered 2023-03-07: 80 mL via INTRAVENOUS

## 2023-03-07 NOTE — Assessment & Plan Note (Signed)
Sinus tachycardia. Likely Multifactorial due to poor oral intake, deconditioning, cancer Encourage oral hydration.  IVF normal saline 1 L x 1 today.

## 2023-03-07 NOTE — Progress Notes (Signed)
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  Taking oral chemotherapy  Met with patient and niece.  Patient reports decreased appetite. Eating bits and pieces.  Tried the high calorie shake but says that chocolate caused constipation.  Has been eating chicken and salmon but bites.     Medications: restarted dexamethasone  Labs: reviewed  Anthropometrics:   Weight 102 lb 3.2 oz  119 lb on 2/27 134 lb on 12/11/22   NUTRITION DIAGNOSIS: Inadequate oral intake continues   INTERVENTION:  Encouraged high calorie high protein foods Continue dexamethasone Encouraged high calorie shake    MONITORING, EVALUATION, GOAL: weight trends, intake   NEXT VISIT: as needed   Brittney Fisher, [place], [place] Registered Dietitian [phone]

## 2023-03-07 NOTE — Assessment & Plan Note (Signed)
Encourage patient to utilize home oxygen as needed. Follow-up with pulmonology.  Started on a course of prednisone 40 mg daily x 5 days

## 2023-03-07 NOTE — Telephone Encounter (Signed)
[facility] (xF) requested. Blood collected today (03/07/23) and taken to [employer] box for shipping.   Financial application was submitted and approved. Pt's out of-of-pocket cost will be $0.   Tracking # [id number]

## 2023-03-07 NOTE — Assessment & Plan Note (Signed)
Follow up with nutritionist.  Recommend nutrition supplementation.  Recommend [name] 5 mg twice daily as appetite stimulants.

## 2023-03-07 NOTE — Assessment & Plan Note (Signed)
patient declines bisphosphonate. Status post IM nail with Dr. Harlow Mares on 01/01/2023

## 2023-03-07 NOTE — Assessment & Plan Note (Signed)
Had a lengthy discussion with patient about goals of care, prognosis. Options of third line chemotherapy treatment versus hospice/comfort care were reviewed and discussed with patient.  She desires further treatments.

## 2023-03-07 NOTE — Assessment & Plan Note (Signed)
status post palliative radiation. Continue OxyContin 20mg  twice daily, and oxycodone 5-10mg  Q4h PRN as directed.

## 2023-03-07 NOTE — Assessment & Plan Note (Signed)
Treatment is listed above. 

## 2023-03-07 NOTE — Progress Notes (Signed)
Unable to obtain IV site today for IVF. Pt to return to clinic on 3/22 for IVF. Instructed to hydrate well tonight. Pt verbalized understanding

## 2023-03-07 NOTE — Progress Notes (Addendum)
Hematology/Oncology Progress note [phone] [phone]  REASON FOR VISIT Follow up for metastatic lung cancer.   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   cT4 N3 M1-  KRASG12C mutation, currently on second line treatment with Lumakras [Patient has high TMB, TPS less than 1%.  Immunotherapy in combination with chemotherapy will be other options in the future if lumakras is not effective] CT femur showed enlargement of right femur lesion, no fracture.  Labs reviewed and discussed with patient. Continue Lumakras  Patient has continued to decline. Recommend MRI brain patient declined.  Brittney Fisher is not interested in getting CT brain done either I recommend to repeat a CT chest abdomen pelvis for further evaluation. We discussed about rationale and potential side effects third line chemotherapy/immunotherapy options.  Patient desires further treatments. Send liquid biopsy NGS.  Recommend brain MRI, Brittney Fisher declined Plan to switch to Immunotherapy with Tecentriq as next line of treatment. May add Taxane in the future.    Chronic respiratory failure with hypoxia [facility]) Encourage patient to utilize home oxygen as needed. Follow-up with pulmonology.  Started on a course of prednisone 40 mg daily x 5 days  Encounter for antineoplastic chemotherapy Treatment is listed above.  Malignant neoplasm metastatic to bone [facility]) patient declines bisphosphonate. Status post IM nail with Dr. Harlow Mares on 01/01/2023   Neoplasm related pain status post palliative radiation. Continue OxyContin 20mg  twice daily, and oxycodone 5-10mg  Q4h PRN as directed.      Tachycardia Sinus tachycardia. Likely Multifactorial due to poor oral intake, deconditioning, cancer Encourage oral hydration.  IVF normal saline 1 L x 1 today.  Weight loss Follow up with nutritionist.  Recommend nutrition supplementation.  Recommend [name] 5 mg twice daily as appetite stimulants.  Goals of care, counseling/discussion Had a lengthy discussion with patient about goals of care, prognosis. Options of third line chemotherapy treatment versus hospice/comfort care were reviewed and discussed with patient.  Brittney Fisher desires further treatments.   Orders Placed This Encounter  Procedures   CT CHEST ABDOMEN PELVIS W CONTRAST    Standing Status:   Future    Number of Occurrences:   1    Standing Expiration Date:   03/06/2024    Order Specific Question:   If indicated for the ordered procedure, I authorize the administration of contrast media per Radiology protocol    Answer:   Yes    Order Specific Question:   Preferred imaging location?    Answer:   [facility]    Order Specific Question:   Is Oral Contrast requested for this exam?    Answer:   Yes, Per Radiology protocol    Follow up to be determined. All questions were answered. The patient knows to call the clinic with any problems, questions or concerns.  [name], MD, PhD [facility] Hematology Oncology 03/07/2023    PERTINENT ONCOLOGY HISTORY 62 y.o. female with past medical history including GERD, COPD, current everyday smoker presents for follow-up of lung mass and pleural effusion. 10/20/2020, patient was brought to ED via EMS due to generalized weakness, dizziness, chest pain shortness of breath.  Brittney Fisher was recently diagnosed with COPD approximately 1 month ago by primary  care provider.  Also unintentional weight loss during the last few months. Image work-up showed right-sided pleural effusion.  Brittney Fisher underwent right thoracentesis.  With removal of 2.4 L of fluid. 10/18/2020, CT chest with contrast showed  central right upper lobe pulmonary bronchogenic carcinoma with direct mediastinal invasion.  Metastatic disease to low cervical/thoracic nodes, lung, right pleural space and bones.  Moderate to large right pleural effusion.  SVC narrowing. 10/21/2020 MRI brain is negative for metastasis.  Mild chronic microvascular ischemic changes in the white matter.  Negative for acute infarct Regarding to the SVC narrowing, patient was seen by vascular surgeon and was recommended no intervention inpatient.  Patient to follow-up outpatient with vascular surgeon for evaluation. Patient also was treated for COPD exacerbation with hypoxia on exertion. Qualifies for home oxygen and hospitalist arrange home health and a nebulizer.  Patient was given a course of prednisone taper and empiric Levaquin. Patient was discharged and present today to follow-up with cytology results and further management plan  10/27/2020 PET scan showed right upper lobe primary bronchogenic mass with direct invasion into mediastinum. Metastatic disease to the right pleural space, lung, bone, nodes of the chest and less so lower leg/upper abdomen. Hypermetabolic him along the course of the right axillary vein with concurrent subtle hyper attenuation within the axillary vein and SVC.  This continues to the level of SVC narrowing.  Suspicious for SVC occlusion and developing thrombus. Moderate right pleural effusion and small pericardial effusion.  # # left supraclavicular mass biopsy- pathology is positive for adenocarcinoma. positive for CK7, with patchy, weak CK20.  They are  negative for TTF1, NapsinA, GATA3, CDX2, and Pax8.  The findings are  nonspecific; but may be compatible with a poorly differentiated  adenocarcinoma of lung origin, especially given the imaging findings  Brittney Fisher also had another repeat thoracentesis and fluid cytology showed malignancy.  #NGS showed AXIN 1 [id number], DIS2 M177fs, KRAS G12C, PRDM1 M1 [id number]*, [id number],  MS stable, TPS <1%   11/14/2020- 12/12/2020.  #Bronchial obstruction and SVC occlusion.  s/p palliative radiation #After 2 cycles of chemtherapy, patient had CT scan done due to shortness of breath.  01/05/2021, CT chest angiogram PE protocol showed no evidence of pulmonary embolism.  Stable disease.  Right upper lobe mass about 6.7 x 5.3 cm.  Widespread metastatic disease, lung and bone involvement. Interval development of right-sided hydronephrosis. 03/06/2021 finished T2 palliative radiation 03/22/2021, 6 cycles of carboplatin/Alimta/bevacizumab.  Infusion reaction of carboplatin during cycle 6.  Carboplatin discontinued. 04/07/2021 CT chest abdomen pelvis-partial response Mild decrease in size of right lung mass, mildly decreased mediastinal and hilar adenopathy.  Extensive right-sided pleural metastasis unchanged.  Similar appearance of diffuse bilateral pulmonary nodules.  Multifocal lytic sclerotic bone metastasis.  New superior endplate deformity of T6 04/12/2021 - 09/08/2021, patient was continued on bevacizumab and Alimta.  09/21/2021, bone scan showed multifocal abnormal uptake including mid to distal shaft of right femur, left femoral neck and trochanter, left sacroiliac region, left ischium.  Focal activity at the proximal tibia on the left.  Multi focal heterogeneous bilateral rib activity.  Focal activity at the sternum. 09/27/2021, CT chest abdomen pelvis with contrast showed progression of infrahilar left lower lobe with a concerning clearly progressive masslike consolidation opacity.  Mild progression of small left pleural effusion.  Similar experience of extensive irregular right-sided pleural disease.  Similar experience of bony metastasis.  09/29/2021 extra cycle of maintenance bevacizumab and Alimta while waiting for approval of Lumakras  10/20/2021 started on Lumakras  01/09/2022,  CT chest abdomen pelvis with contrast showed no substantial interval changes in exam.  Stable  disease. 03/20/2022, CT chest abdomen pelvis with contrast showed unchanged circumferential pleural thickening/pleural nodularity of the right hemithorax, left infrahilar mass and bilateral pulmonary nodules.  Stable right perihilar consolidation likely postradiation changes.  Unchanged numerous sclerotic osseous lesions.  Aortic atherosclerosis.  10/12/22 CT chest abdomen pelvis w contrast 1. No significant change in post treatment appearance of the chest.Perihilar post radiation fibrosis and consolidation of the right lung, mass of the superior segment left lower lobe, extensive right-greater-than-left pleural thickening and nodularity, pleural effusions, and numerous bilateral pulmonary nodules are unchanged. 2. Unchanged matted post treatment appearance of soft tissue throughout the mediastinum and right hilum. Unchanged enlarged left epicardial lymph nodes without other discretely visualized lymph nodes in the chest. 3. Similar appearance of a lytic osseous metastatic lesion of the base of the right femoral neck, with additional unchanged widespread  sclerotic osseous metastatic disease throughout the included axial and proximal appendicular skeleton. 4. No evidence of new metastatic disease nor soft tissue metastatic disease in the abdomen or pelvis.   12/24/2022 CT chest abdomen pelvis with contrast showed Bilateral peripheral pleural nodules are mildly increased in size.  Bilateral small effusions.  Stable consolidation at the left lung base.  Stable ill-defined tissue in the mediastinum suggesting treated tumor infiltrates. no evidence of metastatic disease in abdomen or pelvis.  Stable sclerotic metastasis in the spine and pelvis.  Stable lytic lesion in the right femoral neck may be at risk of pathological fracture. 01/01/2023 -01/02/2023 s/p  IM nail with Dr. Harlow Mares on 01/01/2023  01/11/2023-01/18/2023 admitted for acute COPD exacerbation, patient received antibiotics, bronchodilators and steroids.   Lumakras was held since that admission. CT angio chest PE protocol showed no acute pulmonary embolus.  Stable appearance of the chest from earlier this month.  Ill-defined soft tissue planes within the mediastinum and central right lung are unchanged from prior.  Stable osseous metastatic disease. 01/17/2023 CT angio chest pulmonary embolism protocol showed no acute PE.  Slight interval increase in the size of pleural and parenchymal nodularity seen previously.  Stable diffuse sclerotic bone metastasis.  Aortic atherosclerosis.  01/21/2023 - 01/22/2023, ER visits for dyspnea, patient was discharged with home oxygen.   INTERVAL HISTORY Brittney Fisher is a 62 y.o. female who has above history reviewed by me today presents for follow up visit for management of metastatic lung cancer Today patient was accompanied by daughter.  Patient now uses nasal cannula oxygen.  Shortness of breath has improved. Patient was started on 40 mg of prednisone for 5 days for COPD exacerbation on 03/02/2023. Patient reports fair appetite, Brittney Fisher has lost 17 pounds since last visit.  Review of Systems  Constitutional:  Positive for appetite change, fatigue and unexpected weight change. Negative for chills and fever.  HENT:   Negative for hearing loss and voice change.   Eyes:  Negative for eye problems.  Respiratory:  Positive for shortness of breath. Negative for chest tightness and cough.   Cardiovascular:  Negative for chest pain.  Gastrointestinal:  Negative for abdominal distention, abdominal pain and blood in stool.  Endocrine: Negative for hot flashes.  Genitourinary:  Negative for difficulty urinating and frequency.   Musculoskeletal:  Negative for arthralgias.       Right thigh pain  Skin:  Negative for itching and rash.  Neurological:  Negative for extremity weakness.  Hematological:  Negative for adenopathy.  Psychiatric/Behavioral:  Positive for sleep disturbance. Negative  for confusion.       Allergies   Allergen Reactions   Carboplatin Shortness Of Breath, Itching and Cough     Past Medical History:  Diagnosis Date   COPD (chronic obstructive pulmonary disease) (HCC)    GERD (gastroesophageal reflux disease)    Metastatic lung cancer (metastasis from lung to other site) ([place]) 11/02/2020     Past Surgical History:  Procedure Laterality Date   COLONOSCOPY WITH ESOPHAGOGASTRODUODENOSCOPY (EGD)     COLONOSCOPY WITH PROPOFOL N/A 11/14/2015   Procedure: COLONOSCOPY WITH PROPOFOL;  Surgeon: Hulen Luster, MD;  Location: [facility] ENDOSCOPY;  Service: Gastroenterology;  Laterality: N/A;   ESOPHAGOGASTRODUODENOSCOPY (EGD) WITH PROPOFOL N/A 11/14/2015   Procedure: ESOPHAGOGASTRODUODENOSCOPY (EGD) WITH PROPOFOL;  Surgeon: Hulen Luster, MD;  Location: [facility] ENDOSCOPY;  Service: Gastroenterology;  Laterality: N/A;   FEMUR IM NAIL Right 01/01/2023   Procedure: TREATMENT, INTERTROCHANTERIC FEMORAL FRACTURE, WITH INTRAMEDULLARY IMPLANT (SURG);  Surgeon: Lovell Sheehan, MD;  Location: [facility] ORS;  Service: Orthopedics;  Laterality: Right;    Social History   Socioeconomic History   Marital status: Widowed    Spouse name: Not on file   Number of children: Not on file   Years of education: Not on file   Highest education level: Not on file  Occupational History   Not on file  Tobacco Use   Smoking status: Former    Packs/day: 1.00    Years: 40.00    Additional pack years: 0.00    Total pack years: 40.00    Types: Cigarettes    Quit date: 09/2021    Years since quitting: 1.4   Smokeless tobacco: Never  Vaping Use   Vaping Use: Never used  Substance and Sexual Activity   Alcohol use: Never   Drug use: Never   Sexual activity: Not Currently  Other Topics Concern   Not on file  Social History Narrative   Not on file   Social Determinants of Health   Financial Resource Strain: Low Risk  (02/19/2023)   Overall Financial Resource Strain (CARDIA)    Difficulty of Paying Living Expenses: Not hard at  all  Food Insecurity: No Food Insecurity (01/12/2023)   Hunger Vital Sign    Worried About Running Out of Food in the Last Year: Never true    [place] in the Last Year: Never true  Transportation Needs: No Transportation Needs (01/12/2023)   PRAPARE - Hydrologist (Medical): No    Lack of Transportation (Non-Medical): No  Physical Activity: Inactive (02/19/2023)   Exercise Vital Sign    Days of Exercise per Week: 0 days    Minutes of Exercise per Session: 0 min  Stress: Stress Concern Present (02/19/2023)   [place]    Feeling of Stress : To some extent  Social Connections: Moderately Integrated (02/19/2023)   Social Connection and Isolation Panel [NHANES]    Frequency of Communication with Friends and Family: More than three times a week    Frequency of Social Gatherings with Friends and Family: More than three times a week    Attends Religious Services: 1 to 4 times per year    Active Member of [employer] or Organizations: Yes    Attends Archivist Meetings: Never    Marital Status: Widowed  Intimate Partner Violence: Not At Risk (01/12/2023)   Humiliation, Afraid, Rape, and Kick questionnaire    Fear of Current or Ex-Partner: No  Emotionally Abused: No    Physically Abused: No    Sexually Abused: No    Family History  Problem Relation Age of Onset   Breast cancer Cousin 16   Diabetes type II Mother    Hypertension Mother    Colon cancer Father    Hypertension Father      Current Outpatient Medications:    acetaminophen (TYLENOL) 500 MG tablet, Take 1,000 mg by mouth every 8 (eight) hours as needed for moderate pain. , Disp: , Rfl:    albuterol (VENTOLIN HFA) 108 (90 Base) MCG/ACT inhaler, Inhale 2 puffs into the lungs every 6 (six) hours as needed for wheezing or shortness of breath., Disp: 8 g, Rfl: 2   benzonatate (TESSALON) 100 MG capsule, Take 1 capsule by mouth  three times daily as needed for cough, Disp: 60 capsule, Rfl: 0   dexamethasone (DECADRON) 2 MG tablet, Take 1 tablet (2 mg total) by mouth daily., Disp: 14 tablet, Rfl: 0   dronabinol ([name]) 5 MG capsule, Take 1 capsule (5 mg total) by mouth 2 (two) times daily before a meal., Disp: 60 capsule, Rfl: 0   gabapentin (NEURONTIN) 100 MG capsule, Take 1 capsule (100 mg total) by mouth 3 (three) times daily., Disp: 90 capsule, Rfl: 0   ipratropium-albuterol (DUONEB) 0.5-2.5 (3) MG/3ML SOLN, Take 3 mLs by nebulization 3 (three) times daily., Disp: 360 mL, Rfl: 1   LORazepam (ATIVAN) 0.5 MG tablet, Take 1 tablet (0.5 mg total) by mouth every 8 (eight) hours., Disp: 30 tablet, Rfl: 0   LUMAKRAS 120 MG tablet, TAKE 8 TABLETS BY MOUTH DAILY, Disp: 240 tablet, Rfl: 2   naloxone (NARCAN) nasal spray 4 mg/0.1 mL, For suspected overdose of narcotics., Disp: 1 each, Rfl: 0   omeprazole (PRILOSEC) 20 MG capsule, Take 1 capsule (20 mg total) by mouth daily as needed. Take Lumakras 4 hr before or 10 hr after taking omeprazole., Disp: 30 capsule, Rfl: 0   oxyCODONE (OXY IR/ROXICODONE) 5 MG immediate release tablet, Take 1-2 tablets (5-10 mg total) by mouth every 4 (four) hours as needed for moderate pain or severe pain., Disp: 120 tablet, Rfl: 0   oxyCODONE (OXYCONTIN) 10 mg 12 hr tablet, Take 1 tablet (10 mg total) by mouth every 12 (twelve) hours., Disp: 60 tablet, Rfl: 0   predniSONE (DELTASONE) 20 MG tablet, Take 2 tablets (40 mg total) by mouth daily for 5 days., Disp: 10 tablet, Rfl: 0   protein supplement shake (PREMIER PROTEIN) LIQD, Take 11 oz by mouth daily., Disp: , Rfl:    sennosides-docusate sodium (SENOKOT-S) 8.6-50 MG tablet, Take 1 tablet by mouth daily., Disp: , Rfl:    fluticasone-salmeterol (ADVAIR DISKUS) 250-50 MCG/ACT AEPB, Inhale 1 puff into the lungs in the morning and at bedtime., Disp: 60 each, Rfl: 0  Physical exam: ECOG 2 Vitals:   03/07/23 1409  BP: 95/63  Pulse: (!) 120  Resp: 18   Temp: 97.9 F (36.6 C)  TempSrc: Tympanic  SpO2: 100%  Weight: 102 lb 3.2 oz (46.4 kg)  Height: 5\' 11"  (1.803 m)   Physical Exam Constitutional:      General: Brittney Fisher is not in acute distress.    Appearance: Brittney Fisher is ill-appearing.     Comments: Thin built female walks independently  HENT:     Head: Normocephalic and atraumatic.  Eyes:     General: No scleral icterus. Cardiovascular:     Rate and Rhythm: Regular rhythm. Tachycardia present.     Heart sounds:  Normal heart sounds.  Pulmonary:     Effort: Pulmonary effort is normal. No respiratory distress.     Breath sounds: No wheezing.     Comments: Slightly decreased breath sound bilaterally.  Abdominal:     General: Bowel sounds are normal. There is no distension.     Palpations: Abdomen is soft.  Musculoskeletal:        General: No deformity. Normal range of motion.     Cervical back: Normal range of motion and neck supple.  Skin:    General: Skin is warm and dry.     Findings: No erythema or rash.  Neurological:     Mental Status: Brittney Fisher is alert and oriented to person, place, and time. Mental status is at baseline.     Cranial Nerves: No cranial nerve deficit.     Coordination: Coordination normal.  Psychiatric:        Mood and Affect: Mood normal.    LABORATORY STUDIES     Latest Ref Rng & Units 03/07/2023    1:48 PM 03/02/2023    8:34 PM 02/28/2023   10:12 AM  CBC  WBC 4.0 - 10.5 K/uL 5.0  7.1  5.3   Hemoglobin 12.0 - 15.0 g/dL 11.7  11.0  10.9   Hematocrit 36.0 - 46.0 % 38.0  35.5  35.0   Platelets 150 - 400 K/uL 220  218  194       Latest Ref Rng & Units 03/07/2023    1:48 PM 03/02/2023    8:34 PM 02/28/2023   10:11 AM  CMP  Glucose 70 - 99 mg/dL 142  140  104   BUN 8 - 23 mg/dL 16  13  12    Creatinine 0.44 - 1.00 mg/dL 0.48  0.54  0.41   Sodium 135 - 145 mmol/L 135  132  134   Potassium 3.5 - 5.1 mmol/L 3.7  3.7  3.6   Chloride 98 - 111 mmol/L 98  96  97   CO2 22 - 32 mmol/L 30  30  29    Calcium 8.9 -  10.3 mg/dL 8.7  8.5  8.5   Total Protein 6.5 - 8.1 g/dL 7.2   6.5   Total Bilirubin 0.3 - 1.2 mg/dL 0.4   0.3   Alkaline Phos 38 - 126 U/L 165   164   AST 15 - 41 U/L 18   17   ALT 0 - 44 U/L 14   13      RADIOGRAPHIC STUDIES: I have personally reviewed the radiological images as listed and agreed with the findings in the report. DG Chest Portable 1 View  Result Date: 03/02/2023 CLINICAL DATA:  Shortness of breath EXAM: PORTABLE CHEST 1 VIEW COMPARISON:  01/21/2023, CT 01/17/2023, chest x-ray 12/21/2022, 10/31/2020 FINDINGS: Similar appearance of right greater than left pleural effusions. Distortion in the right apex and perihilar region as before. Bilateral pulmonary nodules corresponding to history of metastatic disease. Scattered foci of sclerosis within the scapula corresponding to history of skeletal metastatic disease. IMPRESSION: Overall stable chest radiograph compared with 01/21/2023. Bilateral effusions, pulmonary metastatic disease and right perihilar and apical distortion do not show significant interval change. Electronically Signed   By: Donavan Foil M.D.   On: 03/02/2023 22:46   US Venous Img Lower Bilateral (DVT)  Result Date: 01/21/2023 CLINICAL DATA:  Shortness of breath. EXAM: BILATERAL LOWER EXTREMITY VENOUS DOPPLER ULTRASOUND TECHNIQUE: Gray-scale sonography with compression, as well as color and duplex ultrasound, were performed to  evaluate the deep venous system(s) from the level of the common femoral vein through the popliteal and proximal calf veins. COMPARISON:  None Available. FINDINGS: VENOUS Normal compressibility of the common femoral, superficial femoral, and popliteal veins, as well as the visualized calf veins. Visualized portions of profunda femoral vein and great saphenous vein unremarkable. No filling defects to suggest DVT on grayscale or color Doppler imaging. Doppler waveforms show normal direction of venous flow, normal respiratory plasticity and response to  augmentation. OTHER None. Limitations: none IMPRESSION: No sonographic evidence of bilateral lower extremity DVT. Electronically Signed   By: Keith Rake M.D.   On: 01/21/2023 20:55   DG Chest 2 View  Result Date: 01/21/2023 CLINICAL DATA:  Shortness of breath with movement and walking, stage IV lung cancer EXAM: CHEST - 2 VIEW COMPARISON:  Earlier study of 01/21/2023 at 1033 hours FINDINGS: Normal heart size, mediastinal contours, and pulmonary vascularity. Changes of COPD with chronic opacity at RIGHT apex and perihilar region likely related to prior therapy. Persistent small bibasilar pleural effusions greater on RIGHT. Nodular foci in both lungs consistent with known metastatic disease. No pneumothorax. Bones demineralized with chronic height loss of an upper thoracic vertebra unchanged. Scattered sclerotic foci consistent with osseous metastases in spine, RIGHT scapula, proximal RIGHT humerus. IMPRESSION: Post therapy changes in the RIGHT lung. Pulmonary metastatic disease with small bibasilar effusions greater on RIGHT. Osseous metastases. Emphysema (ICD10-J43.9). Electronically Signed   By: Lavonia Dana M.D.   On: 01/21/2023 17:16   DG Chest Portable 1 View  Result Date: 01/21/2023 CLINICAL DATA:  Shortness of breath on exertion. Stage IV lung cancer. EXAM: PORTABLE CHEST 1 VIEW COMPARISON:  01/15/2023 and CT chest 01/17/2023. FINDINGS: Trachea is midline. Heart size normal. Extensive post radiation in the upper right hemithorax and right perihilar region. Bilateral pleuroparenchymal nodularity, as on comparison CT. Small bilateral pleural effusions. Findings are unchanged from 01/15/2023. IMPRESSION: 1. Pulmonary metastatic disease. 2. Small bilateral pleural effusions. 3. Post radiation changes in the upper right hemithorax and right perihilar region. 4. No superimposed acute abnormality. Electronically Signed   By: Lorin Picket M.D.   On: 01/21/2023 10:46   CT Angio Chest Pulmonary Embolism  (PE) W or WO Contrast  Result Date: 01/17/2023 CLINICAL DATA:  COPD, history of metastatic lung cancer, worsening dyspnea and cough EXAM: CT ANGIOGRAPHY CHEST WITH CONTRAST TECHNIQUE: Multidetector CT imaging of the chest was performed using the standard protocol during bolus administration of intravenous contrast. Multiplanar CT image reconstructions and MIPs were obtained to evaluate the vascular anatomy. RADIATION DOSE REDUCTION: This exam was performed according to the departmental dose-optimization program which includes automated exposure control, adjustment of the mA and/or kV according to patient size and/or use of iterative reconstruction technique. CONTRAST:  30mL OMNIPAQUE IOHEXOL 350 MG/ML SOLN COMPARISON:  01/11/2023 FINDINGS: Cardiovascular: This is a technically adequate evaluation of the pulmonary vasculature. No filling defects or pulmonary emboli. The heart is unremarkable without pericardial effusion. No evidence of thoracic aortic aneurysm or dissection. Stable atherosclerosis of the aorta and coronary vasculature. 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 Mediastinum/Nodes: Loss of normal fat planes throughout the mediastinum may reflect post therapeutic change or sequela of metastatic disease. Discrete adenopathy or mass is difficult to measure, but the appearance is stable since the previous evaluation. Thyroid, trachea, and esophagus are grossly unremarkable. Lungs/Pleura: Chronic areas of consolidation within the bilateral lower lobes are again noted  and unchanged. Extensive pleural nodularity again noted bilaterally, right greater than left. Chronic scarring and bronchovascular thickening within the right hilar region unchanged. Index nodules measured previously are as follows: Left upper lobe, image 50/6, 8 mm.  Previously 7 mm. Right lower lobe, image 103/6, 16  mm.  Previously 14 mm. Right middle lobe, image 103/6, 13 mm.  Previously 12 mm. Left upper lobe, image 99/6, 13 mm.  Previously 12 mm. Trace bilateral pleural effusions are again noted.  No pneumothorax. Upper Abdomen: No acute abnormality. Musculoskeletal: Stable sclerotic metastatic disease within the bilateral scapula, sternum, and thoracic spine. There are no acute displaced fractures. Reconstructed images demonstrate no additional findings. Review of the MIP images confirms the above findings. IMPRESSION: 1. No evidence of pulmonary embolus. 2. Findings consistent with known metastatic lung cancer, with slight interval increase in size of the pleural and parenchymal nodularity seen previously. 3. Stable diffuse sclerotic bony metastases. 4.  Aortic Atherosclerosis (ICD10-I70.0). Electronically Signed   By: Randa Ngo M.D.   On: 01/17/2023 16:37   ECHOCARDIOGRAM COMPLETE  Result Date: 01/16/2023    ECHOCARDIOGRAM REPORT   Patient Name:   DANASHIA BOHLEY Date of Exam: 01/16/2023 Medical Rec #:  [id number]        Height:       71.0 in Accession #:    [id number]       Weight:       133.8 lb Date of Birth:  04/29/1961        BSA:          1.778 m Patient Age:    10 years         BP:           129/71 mmHg Patient Gender: F                HR:           110 bpm. Exam Location:  [facility] Procedure: 2D Echo, Color Doppler and Cardiac Doppler Indications:    Acute respiratory distress R06.03  History:        Patient has no prior history of Echocardiogram examinations.                 COPD.  Sonographer:    Sherrie Sport Referring Phys: [id number] [facility] Diagnosing      Kate Sable MD Phys:  Sonographer Comments: Image acquisition challenging due to patient body habitus. IMPRESSIONS  1. Left ventricular ejection fraction, by estimation, is 60 to 65%. The left ventricle has normal function. The left ventricle has no regional wall motion abnormalities. Left ventricular diastolic parameters are consistent with  Grade I diastolic dysfunction (impaired relaxation).  2. Right ventricular systolic function is normal. The right ventricular size is normal.  3. The mitral valve is normal in structure. No evidence of mitral valve regurgitation.  4. The aortic valve is tricuspid. Aortic valve regurgitation is not visualized. FINDINGS  Left Ventricle: Left ventricular ejection fraction, by estimation, is 60 to 65%. The left ventricle has normal function. The left ventricle has no regional wall motion abnormalities. The left ventricular internal cavity size was normal in size. There is  no left ventricular hypertrophy. Left ventricular diastolic parameters are consistent with Grade I diastolic dysfunction (impaired relaxation). Right Ventricle: The right ventricular size is normal. No increase in right ventricular wall thickness. Right ventricular systolic function is normal. Left Atrium: Left atrial size was normal in size. Right Atrium: Right atrial size was normal in size. Pericardium:  There is no evidence of pericardial effusion. Mitral Valve: The mitral valve is normal in structure. No evidence of mitral valve regurgitation. Tricuspid Valve: The tricuspid valve is normal in structure. Tricuspid valve regurgitation is not demonstrated. Aortic Valve: The aortic valve is tricuspid. Aortic valve regurgitation is not visualized. Aortic valve mean gradient measures 3.0 mmHg. Aortic valve peak gradient measures 4.8 mmHg. Aortic valve area, by VTI measures 2.30 cm. Pulmonic Valve: The pulmonic valve was normal in structure. Pulmonic valve regurgitation is not visualized. Aorta: The aortic root is normal in size and structure. Venous: The inferior vena cava was not well visualized. IAS/Shunts: No atrial level shunt detected by color flow Doppler.  LEFT VENTRICLE PLAX 2D LVIDd:         3.20 cm   Diastology LVIDs:         1.90 cm   LV e' medial:    7.07 cm/s LV PW:         0.90 cm   LV E/e' medial:  9.1 LV IVS:        1.10 cm   LV e'  lateral:   9.25 cm/s LVOT diam:     1.90 cm   LV E/e' lateral: 7.0 LV SV:         37 LV SV Index:   21 LVOT Area:     2.84 cm  RIGHT VENTRICLE RV Basal diam:  3.60 cm RV Mid diam:    2.70 cm RV S prime:     18.40 cm/s TAPSE (M-mode): 2.1 cm LEFT ATRIUM           Index       RIGHT ATRIUM          Index LA diam:      1.50 cm 0.84 cm/m  RA Area:     9.50 cm LA Vol (A2C): 6.9 ml  3.88 ml/m  RA Volume:   17.30 ml 9.73 ml/m LA Vol (A4C): 10.0 ml 5.63 ml/m  AORTIC VALVE AV Area (Vmax):    2.35 cm AV Area (Vmean):   2.05 cm AV Area (VTI):     2.30 cm AV Vmax:           109.00 cm/s AV Vmean:          76.900 cm/s AV VTI:            0.160 m AV Peak Grad:      4.8 mmHg AV Mean Grad:      3.0 mmHg LVOT Vmax:         90.20 cm/s LVOT Vmean:        55.600 cm/s LVOT VTI:          0.130 m LVOT/AV VTI ratio: 0.81  AORTA Ao Root diam: 3.20 cm MITRAL VALVE               TRICUSPID VALVE MV Area (PHT): 6.90 cm    TR Peak grad:   31.6 mmHg MV Decel Time: 110 msec    TR Vmax:        281.00 cm/s MV E velocity: 64.50 cm/s MV A velocity: 96.90 cm/s  SHUNTS MV E/A ratio:  0.67        Systemic VTI:  0.13 m                            Systemic Diam: 1.90 cm Debbe Odea MD Electronically signed by Debbe Odea MD Signature Date/Time: 01/16/2023/4:29:24  PM    Final    DG Chest Port 1 View  Result Date: 01/15/2023 CLINICAL DATA:  Shortness of breath.  COPD.  Metastatic lung cancer. EXAM: PORTABLE CHEST 1 VIEW COMPARISON:  01/11/2023 FINDINGS: Heart size remains normal. Chronic volume loss and scarring in the right upper chest remains stable. Chronic bilateral pleural blunting remains stable. Chronic volume loss/infiltrate in both lower lobes. Compared to the study of 4 days ago, findings appear quite similar. No new process is identified. IMPRESSION: No significant change since the study of 4 days ago. Chronic volume loss and scarring in the right upper chest. Chronic bilateral pleural blunting with infiltrate/volume loss in  both lower lobes. Electronically Signed   By: Nelson Chimes M.D.   On: 01/15/2023 08:07   US Abdomen Limited RUQ (LIVER/GB)  Result Date: 01/12/2023 CLINICAL DATA:  62 year old female with history of abdominal pain. EXAM: ULTRASOUND ABDOMEN LIMITED RIGHT UPPER QUADRANT COMPARISON:  No priors. FINDINGS: Gallbladder: Gallbladder is moderately distended. No evidence of gallstones or biliary sludge. Gallbladder wall thickness is normal. No pericholecystic fluid. Per report from the sonographer, there was no sonographic Murphy's sign on examination. Common bile duct: Diameter: 3.4 mm. Liver: No focal lesion identified. Within normal limits in parenchymal echogenicity. Portal vein is patent on color Doppler imaging with normal direction of blood flow towards the liver. Other: Incidental imaging of the right kidney demonstrates an anechoic lesion with increased through transmission in the interpolar region, compatible with a large parapelvic cyst, estimated to measure approximately 4.3 x 4.0 x 2.8 cm (no imaging follow-up recommended). IMPRESSION: 1. No acute findings. Specifically, no gallstones and no evidence of acute cholecystitis. Electronically Signed   By: Vinnie Langton M.D.   On: 01/12/2023 05:42   CT Angio Chest PE W and/or Wo Contrast  Result Date: 01/11/2023 CLINICAL DATA:  Pulmonary embolism (PE) suspected, high prob Patient with history of metastatic lung cancer. EXAM: CT ANGIOGRAPHY CHEST WITH CONTRAST TECHNIQUE: Multidetector CT imaging of the chest was performed using the standard protocol during bolus administration of intravenous contrast. Multiplanar CT image reconstructions and MIPs were obtained to evaluate the vascular anatomy. RADIATION DOSE REDUCTION: This exam was performed according to the departmental dose-optimization program which includes automated exposure control, adjustment of the mA and/or kV according to patient size and/or use of iterative reconstruction technique. CONTRAST:   1mL OMNIPAQUE IOHEXOL 350 MG/ML SOLN COMPARISON:  Radiograph earlier today. Staging chest CT 12/24/2022 FINDINGS: Cardiovascular: There are no filling defects within the pulmonary arteries to suggest pulmonary embolus. Aortic atherosclerosis and tortuosity. No acute aortic findings. Stable heart size. Trace pericardial effusion. Bilateral anterior chest wall collaterals, however no evidence of SVC occlusion. Mediastinum/Nodes: Ill-defined soft tissue planes within the mediastinum again seen without significant interval change. Ill-defined soft tissue density seen anterior to the trachea, at the level of the carina and adjacent to the right mainstem bronchus. 14 mm central left hilar lymph node versus pulmonary nodule. Patulous esophagus. Lungs/Pleura: Again seen pleural thickening the right lung apex, near circumferential throughout the right hemithorax with areas of pleural nodularity. Areas of peripheral pleural based nodularity in the right hemithorax are again seen. Representative right lower lobe pleural base nodule measures 15 x 12 mm series 7, image 97, unchanged by my retrospective measurement. Peri diaphragmatic nodule in the right lung base measures 14 mm series 7, image 111, also unchanged. Pleural based right middle lobe nodule measures 11 mm series 7, image 109, unchanged. Interstitial nodularity throughout the right lower lobe with bronchial  thickening is stable. Ill-defined soft tissue density in the central right perihilar lung is unchanged. Small right pleural effusion. Consolidation in the left lower lobe, most prominent centrally, with heterogeneous parenchyma, stable from prior. Subpleural left upper lobe nodule measures 12 mm series 7, image 102, previously 10 mm. Subpleural left upper lobe nodule measuring 7 mm series 7, image 58, previously 6 mm. Similar left pleural thickening with small left pleural effusion. No definite acute airspace disease. Upper Abdomen: Assessed on abdominopelvic CT  earlier today. Musculoskeletal: Multifocal sclerotic osseous lesions typical of metastatic disease. Exaggerated upper thoracic kyphosis. Unchanged upper thoracic mild compression deformities. No new osseous findings. Review of the MIP images confirms the above findings. IMPRESSION: 1. No pulmonary embolus. 2. History of known lung cancer, with stable appearance of the chest from earlier this month. Recommend continued oncologic follow-up per protocol. 3. Ill-defined soft tissue planes within the mediastinum and central right lung are unchanged from prior exam. 4. Stable osseous metastatic disease. Aortic Atherosclerosis (ICD10-I70.0). Electronically Signed   By: Keith Rake M.D.   On: 01/11/2023 21:21   CT ABDOMEN PELVIS WO CONTRAST  Result Date: 01/11/2023 CLINICAL DATA:  Left lower quadrant abdominal pain. 2 days. History of non-small-cell lung cancer. EXAM: CT ABDOMEN AND PELVIS WITHOUT CONTRAST TECHNIQUE: Multidetector CT imaging of the abdomen and pelvis was performed following the standard protocol without IV contrast. RADIATION DOSE REDUCTION: This exam was performed according to the departmental dose-optimization program which includes automated exposure control, adjustment of the mA and/or kV according to patient size and/or use of iterative reconstruction technique. COMPARISON:  CT 12/24/2022. older CT sidewall. X-ray 01/11/2023 earlier FINDINGS: Lower chest: Once again at the lung bases are pleural effusions, pleural thickening and masslike areas with reticulonodular changes, similar to the prior examination. Please correlate with prior chest CT and clinical history of lung cancer. Pericardial effusion is also seen with some enlarged pre cardiophrenic lymph nodes Hepatobiliary: On this non IV contrast exam, the liver is grossly preserved. The gallbladder is distended but unchanged from prior. Pancreas: Grossly the pancreas has preserved parenchyma. Spleen: Spleen is nonenlarged. Adrenals/Urinary  Tract: Stable mild thickening of the adrenal glands. Bilateral Bosniak 1 renal cysts are stable. No specific imaging follow-up of the cysts in the kidneys. No renal or ureteral stones clearly seen. Contracted urinary bladder. Stomach/Bowel: Stomach is nondilated. There is some luminal air and fluid. There is some high attenuation seen along the course of the colon with scattered stool. The colon is nondilated. The cecum is in the low right hemipelvis. Very redundant course to loops of colon. Normal appendix identified posterior cecum in the right hemipelvis as seen on coronal series 4, image 39. No clear adjacent inflammatory stranding along the colon. Small bowel is nondilated. Vascular/Lymphatic: Diffuse vascular calcifications along the aorta and branch vessels. Grossly preserved caliber IVC. Reproductive: Uterus and bilateral adnexa are unremarkable. There are some calcifications along the uterus, possible calcified fibroids. Other: Anasarca.  No clear ascites. Musculoskeletal: Scattered sclerotic bone metastases are identified. Please correlate with prior workup. Interval placement of dynamic right hip screw with acute surgical changes. Lytic lesion along the right femoral neck is again seen. Stranding. IMPRESSION: Study limited without the advantage of contrast and lack of intra-abdominal fat. No bowel obstruction, free air or free fluid. Diffuse scattered colonic stool. Normal appendix. Anasarca. Interval placement of a dynamic right hip screw transfixing the lucent lesion in the femoral neck. There is sclerotic bone metastases elsewhere as per prior history. Distended gallbladder but unchanged  from previous. If there is concern of acute gallbladder pathology ultrasound may be useful. Extensive abnormality along the lung bases consistent with known history of lung cancer. Please correlate with recent workup. Electronically Signed   By: Jill Side M.D.   On: 01/11/2023 19:32   DG Chest Portable 1  View  Result Date: 01/11/2023 CLINICAL DATA:  Shortness of breath. Cancer. Evaluate for effusion or edema. EXAM: PORTABLE CHEST 1 VIEW COMPARISON:  Radiograph 12/21/2022. CT 12/24/2022 FINDINGS: Chronic volume loss in the right hemithorax. Chronic right apical opacity, ill-defined right basilar opacities and right basilar effusion. Left infrahilar opacity and small left effusion are similar to prior CT. No pulmonary edema. Stable heart size and mediastinal contours. No pneumothorax. Scattered sclerotic lesions are better appreciated on prior CT. IMPRESSION: 1. Stable radiographic appearance of the chest. Stable right lung volume loss with apical and basilar opacity and right pleural effusion from earlier this month. 2. Stable left basilar opacity and small left pleural effusion. 3. No pulmonary edema. Electronically Signed   By: Keith Rake M.D.   On: 01/11/2023 19:19   DG Abdomen 1 View  Result Date: 01/11/2023 CLINICAL DATA:  Constipation. Patient reports abdominal pain. EXAM: ABDOMEN - 1 VIEW COMPARISON:  CT 12/24/2022 FINDINGS: Added views of the abdomen obtained. No bowel dilatation to suggest obstruction. Moderate volume of stool in the colon, mild gaseous distension of splenic flexure. No radiopaque calculi or abnormal soft tissue calcifications. Surgical hardware in the right proximal femur is partially visualized. Stable sclerotic densities within the left proximal femur. Sclerotic lesions within the bony pelvis are only faintly visualized by radiograph. Known sclerotic metastatic disease. IMPRESSION: Nonobstructive bowel gas pattern. Moderate volume of colonic stool. Electronically Signed   By: Keith Rake M.D.   On: 01/11/2023 18:10   DG FEMUR, MIN 2 VIEWS RIGHT  Result Date: 01/01/2023 CLINICAL DATA:  Right intramedullary nail EXAM: RIGHT FEMUR 2 VIEWS COMPARISON:  None Available. FINDINGS: Fluoroscopic images were obtained intraoperatively and submitted for post operative  interpretation. Right intramedullary nail placement, 10 images were obtained with 1 minute and 27 seconds of fluoroscopy time and 8.23 mGy. Please see the performing provider's procedural report for further detail. IMPRESSION: Intraoperative fluoroscopic guidance for right intramedullary nail placement. Electronically Signed   By: Yetta Glassman M.D.   On: 01/01/2023 14:57   DG C-Arm 1-60 Min-No Report  Result Date: 01/01/2023 Fluoroscopy was utilized by the requesting physician.  No radiographic interpretation.   DG C-Arm 1-60 Min-No Report  Result Date: 01/01/2023 Fluoroscopy was utilized by the requesting physician.  No radiographic interpretation.   CT CHEST ABDOMEN PELVIS W CONTRAST  Result Date: 12/24/2022 CLINICAL DATA:  Non-small cell lung cancer. Assess treatment response. Initial diagnosis November 2021. Chemotherapy radiation therapy. Immunotherapy completed. * Tracking Code: BO * EXAM: CT CHEST, ABDOMEN, AND PELVIS WITH CONTRAST TECHNIQUE: Multidetector CT imaging of the chest, abdomen and pelvis was performed following the standard protocol during bolus administration of intravenous contrast. RADIATION DOSE REDUCTION: This exam was performed according to the departmental dose-optimization program which includes automated exposure control, adjustment of the mA and/or kV according to patient size and/or use of iterative reconstruction technique. CONTRAST:  87mL OMNIPAQUE IOHEXOL 300 MG/ML  SOLN COMPARISON:  03/20/2022 FINDINGS: CT CHEST FINDINGS Cardiovascular: No significant vascular findings. Normal heart size. No pericardial effusion. Mediastinum/Nodes: Ill-defined tissue planes within the mediastinum consistent with tumor infiltrate. No change from comparison exam. No axillary supraclavicular adenopathy Lungs/Pleura: Peripheral nodules in the RIGHT lower lobe and along  the RIGHT lower lobe pleural surface. For example lateral nodule in the RIGHT lower lobe along the pleural surface  measures 16 mm (image 86/3 compared to 12 mm. Nodule over the RIGHT hemidiaphragm measuring 13 mm (image 104/3) compares to 11 mm. Nodule in the RIGHT middle lobe pleural surface measures 11 mm (97/3 compared to 11 mm. There is bronchiectasis and peribronchial thickening in the RIGHT lobe similar prior. Several new peripheral nodules in the LEFT lung. For example LEFT upper lobe nodule measuring 10 mm (image 91/3) compares to 6 mm. Consolidation in the LEFT lower lobe is similar prior. RIGHT upper lobe nodule measuring 6 mm compares to 5 (image 46/3 Musculoskeletal: Stable sclerotic lesions in the thoracic spine CT ABDOMEN AND PELVIS FINDINGS Hepatobiliary: No focal hepatic lesion. No biliary ductal dilatation. Gallbladder is normal. Common bile duct is normal. Pancreas: Pancreas is normal. No ductal dilatation. No pancreatic inflammation. Spleen: Normal spleen Adrenals/urinary tract: Adrenal glands and kidneys are normal. The ureters and bladder normal. Stomach/Bowel: Stomach, small bowel, appendix, and cecum are normal. The colon and rectosigmoid colon are normal. Vascular/Lymphatic: Abdominal aorta is normal caliber with atherosclerotic calcification. There is no retroperitoneal or periportal lymphadenopathy. No pelvic lymphadenopathy. Reproductive: Uterus and adnexa unremarkable. Other: No peritoneal metastasis Musculoskeletal: Multiple round sclerotic lesions in the proximal femurs, iliac bones, sacrum and lower thoracic spine are unchanged. Lytic lesion within the RIGHT femoral neck measures 2.7 cm unchanged IMPRESSION: CHEST IMPRESSION: 1. Bilateral peripheral pleural nodules are mildly increased in size. 2. Bilateral small effusions. Stable consolidation at the LEFT lung base 3. Stable ill-defined tissue in the mediastinum suggesting treated tumor infiltrate. PELVIS IMPRESSION: 1. No evidence of metastatic disease in the abdomen pelvis. 2. Stable sclerotic metastasis in the spine and pelvis. 3. Stable lytic  lesion in the RIGHT femoral neck may be at risk for pathologic fracture. 4.  Aortic Atherosclerosis (ICD10-I70.0). Electronically Signed   By: Suzy Bouchard M.D.   On: 12/24/2022 14:06   [place] CHEST (PLEURAL EFFUSION)  Result Date: 12/21/2022 CLINICAL DATA:  Pleural effusion EXAM: CHEST ULTRASOUND COMPARISON:  None Available. FINDINGS: Focused sonographic exam of the bilateral chest was performed for fluid assessment. Images demonstrate no significant pleural effusion on either the right or left side. No thoracentesis performed. IMPRESSION: No significant pleural effusion appreciated by ultrasound on either side. No thoracentesis performed. Electronically Signed   By: Albin Felling M.D.   On: 12/21/2022 12:32   DG Chest 2 View  Result Date: 12/21/2022 CLINICAL DATA:  History of lung cancer with several week history of shortness of breath EXAM: CHEST - 2 VIEW COMPARISON:  CT chest dated 10/12/2022 chest radiograph dated 12/30/2020 FINDINGS: Slightly low lung volumes. Persistent right apical opacity and asymmetric patchy right lung opacities. Multifocal right lung nodules are better evaluated on prior chest CT. New peripheral left upper lung nodular opacity. Small to moderate right pleural effusion. Trace left pleural effusion. The heart size and mediastinal contours are within normal limits. The visualized skeletal structures are unremarkable. IMPRESSION: 1. Persistent right apical opacity and asymmetric patchy right lung opacities. Multifocal right lung nodules are better evaluated on prior chest CT. 2. New peripheral left upper lung nodular opacity may represent a focus of infection or metastasis. 3. Small to moderate right pleural effusion. Trace left pleural effusion. Electronically Signed   By: Darrin Nipper M.D.   On: 12/21/2022 09:03

## 2023-03-07 NOTE — Progress Notes (Signed)
Patient states that she has nausea.patient needs rx refill on gabapentin, tessalon and oxycodone.

## 2023-03-07 NOTE — Assessment & Plan Note (Addendum)
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   cT4 N3 M1-  KRASG12C mutation, currently on second line treatment with Lumakras [Patient has high TMB, TPS less than 1%.  Immunotherapy in combination with chemotherapy will be other options in the future if lumakras is not effective] CT femur showed enlargement of right femur lesion, no fracture.  Labs reviewed and discussed with patient. Continue Lumakras  Patient has continued to decline. Recommend MRI brain patient declined.  She is not interested in getting CT brain done either I recommend to repeat a CT chest abdomen pelvis for further evaluation. We discussed about rationale and potential side effects third line chemotherapy/immunotherapy options.  Patient desires further treatments. Send liquid biopsy NGS.  Recommend brain MRI, she declined Plan to switch to Immunotherapy with Tecentriq as next line of treatment. May add Taxane in the future.

## 2023-03-08 ENCOUNTER — Inpatient Hospital Stay: Payer: Medicaid Other

## 2023-03-08 ENCOUNTER — Ambulatory Visit
Admission: RE | Admit: 2023-03-08 | Discharge: 2023-03-08 | Disposition: A | Discharge status: Home / Self Care | Payer: Medicaid Other | Source: Ambulatory Visit | Attending: Oncology | Admitting: Oncology

## 2023-03-08 VITALS — BP 112/65 | HR 106 | Temp 96.6°F | Resp 20

## 2023-03-08 DIAGNOSIS — C3490 Malignant neoplasm of unspecified part of unspecified bronchus or lung: Secondary | ICD-10-CM

## 2023-03-08 DIAGNOSIS — E860 Dehydration: Secondary | ICD-10-CM | POA: Diagnosis not present

## 2023-03-08 MED ORDER — IOHEXOL 300 MG/ML  SOLN
80.0000 mL | Freq: Once | INTRAMUSCULAR | Status: AC | PRN
  Administered 2023-03-08: 80 mL via INTRAVENOUS

## 2023-03-08 MED ORDER — SODIUM CHLORIDE 0.9 % IV SOLN
Freq: Once | INTRAVENOUS | Status: AC
  Filled 2023-03-08: qty 250

## 2023-03-08 NOTE — Progress Notes (Signed)
22 gauge PIV in left ac left in for CT scan

## 2023-03-09 ENCOUNTER — Other Ambulatory Visit: Payer: Self-pay | Admitting: Oncology

## 2023-03-09 ENCOUNTER — Other Ambulatory Visit: Payer: Self-pay | Admitting: Hospice and Palliative Medicine

## 2023-03-11 ENCOUNTER — Other Ambulatory Visit: Payer: Self-pay | Admitting: Oncology

## 2023-03-11 ENCOUNTER — Other Ambulatory Visit: Payer: Self-pay | Admitting: *Deleted

## 2023-03-11 ENCOUNTER — Encounter: Payer: Self-pay | Admitting: Oncology

## 2023-03-11 DIAGNOSIS — C3490 Malignant neoplasm of unspecified part of unspecified bronchus or lung: Secondary | ICD-10-CM

## 2023-03-11 NOTE — Progress Notes (Signed)
DISCONTINUE ON PATHWAY REGIMEN - Non-Small Cell Lung     A cycle is every 21 days:     Pemetrexed      Carboplatin      Bevacizumab-xxxx   **Always confirm dose/schedule in your pharmacy ordering system**  REASON: Disease Progression PRIOR TREATMENT: [id number]: Carboplatin AUC=5 + Pemetrexed 500 mg/m2 + Bevacizumab 15 mg/kg q21 Days x 1 Cycle TREATMENT RESPONSE: Progressive Disease (PD)  START ON PATHWAY REGIMEN - Non-Small Cell Lung     A cycle is every 21 days:     Atezolizumab   **Always confirm dose/schedule in your pharmacy ordering system**  Patient Characteristics: Stage IV Metastatic, Nonsquamous, Molecular Analysis Completed, Molecular Alteration Present and Targeted Therapy Exhausted OR KRAS G12C+ or HER2+ Present and No Prior Chemo/Immunotherapy OR No Alteration Present, Second Line -  Chemotherapy/Immunotherapy, PS = 2, No Prior PD-1/PD-L1  Inhibitor and Immunotherapy Candidate Therapeutic Status: Stage IV Metastatic Histology: Nonsquamous Cell Broad Molecular Profiling Status: Molecular Analysis Completed Molecular Analysis Results: Alteration Present and Targeted Therapy Exhausted ECOG Performance Status: 2 Chemotherapy/Immunotherapy Line of Therapy: Second Line Chemotherapy/Immunotherapy Immunotherapy Candidate Status: Candidate for Immunotherapy Prior Immunotherapy Status: No Prior PD-1/PD-L1 Inhibitor Intent of Therapy: Non-Curative / Palliative Intent, Discussed with Patient

## 2023-03-12 ENCOUNTER — Encounter: Payer: Self-pay | Admitting: Oncology

## 2023-03-12 ENCOUNTER — Telehealth: Payer: Self-pay

## 2023-03-12 ENCOUNTER — Other Ambulatory Visit: Payer: Self-pay | Admitting: Oncology

## 2023-03-12 DIAGNOSIS — C3490 Malignant neoplasm of unspecified part of unspecified bronchus or lung: Secondary | ICD-10-CM

## 2023-03-12 MED ORDER — ALBUTEROL SULFATE HFA 108 (90 BASE) MCG/ACT IN AERS: 2.0000 | INHALATION_SPRAY | Freq: Four times a day (QID) | RESPIRATORY_TRACT | 2 refills | Status: DC | PRN

## 2023-03-12 MED ORDER — BENZONATATE 100 MG PO CAPS: ORAL_CAPSULE | ORAL | 3 refills | Status: DC

## 2023-03-12 MED ORDER — OXYCODONE HCL 5 MG PO TABS: 5.0000 mg | ORAL_TABLET | ORAL | 0 refills | Status: DC | PRN

## 2023-03-12 NOTE — Telephone Encounter (Signed)
-----   Message from [name], MD sent at 03/11/2023  9:36 PM EDT ----- Please let patient and her daughter know that CT showed disease progression.  I recommend to switch to next line of treatment - immunotherapy  Please arrange 4/4/ lab MD tecentriq. Chemo IS is updated.  Also schedule her to see lab NP +/- IVF around 3/28 or 3/29

## 2023-03-12 NOTE — Telephone Encounter (Signed)
Called and spoke to Condon regarding CT results and follow up plan. She verbalized understanding and would like call with appts.   Please schedule:   lab/NP/ +/- IVF on 3/29 (this date works best for pt.   Lab/MD/ Tecentriq NEW on 4/4 (req in IS)

## 2023-03-13 ENCOUNTER — Encounter: Payer: Self-pay | Admitting: Oncology

## 2023-03-14 ENCOUNTER — Inpatient Hospital Stay (HOSPITAL_BASED_OUTPATIENT_CLINIC_OR_DEPARTMENT_OTHER): Payer: Medicaid Other | Admitting: Nurse Practitioner

## 2023-03-14 ENCOUNTER — Encounter: Payer: Self-pay | Admitting: Nurse Practitioner

## 2023-03-14 ENCOUNTER — Inpatient Hospital Stay: Payer: Medicaid Other

## 2023-03-14 VITALS — BP 107/59 | HR 122 | Temp 96.4°F | Wt 112.0 lb

## 2023-03-14 DIAGNOSIS — C3490 Malignant neoplasm of unspecified part of unspecified bronchus or lung: Secondary | ICD-10-CM

## 2023-03-14 DIAGNOSIS — Z09 Encounter for follow-up examination after completed treatment for conditions other than malignant neoplasm: Secondary | ICD-10-CM | POA: Diagnosis not present

## 2023-03-14 DIAGNOSIS — C7951 Secondary malignant neoplasm of bone: Secondary | ICD-10-CM

## 2023-03-14 DIAGNOSIS — E860 Dehydration: Secondary | ICD-10-CM | POA: Diagnosis not present

## 2023-03-14 LAB — CBC WITH DIFFERENTIAL (CANCER CENTER ONLY)
Abs Immature Granulocytes: 0.02 10*3/uL (ref 0.00–0.07)
Basophils Absolute: 0 10*3/uL (ref 0.0–0.1)
Basophils Relative: 0 %
Eosinophils Absolute: 0 10*3/uL (ref 0.0–0.5)
Eosinophils Relative: 0 %
HCT: 35.8 % — ABNORMAL LOW (ref 36.0–46.0)
Hemoglobin: 11.2 g/dL — ABNORMAL LOW (ref 12.0–15.0)
Immature Granulocytes: 0 %
Lymphocytes Relative: 8 %
Lymphs Abs: 0.5 10*3/uL — ABNORMAL LOW (ref 0.7–4.0)
MCH: 27.5 pg (ref 26.0–34.0)
MCHC: 31.3 g/dL (ref 30.0–36.0)
MCV: 88 fL (ref 80.0–100.0)
Monocytes Absolute: 0.5 10*3/uL (ref 0.1–1.0)
Monocytes Relative: 8 %
Neutro Abs: 4.9 10*3/uL (ref 1.7–7.7)
Neutrophils Relative %: 84 %
Platelet Count: 198 10*3/uL (ref 150–400)
RBC: 4.07 MIL/uL (ref 3.87–5.11)
RDW: 14.6 % (ref 11.5–15.5)
WBC Count: 5.9 10*3/uL (ref 4.0–10.5)
nRBC: 0 % (ref 0.0–0.2)

## 2023-03-14 LAB — CMP (CANCER CENTER ONLY)
ALT: 14 U/L (ref 0–44)
AST: 21 U/L (ref 15–41)
Albumin: 2.8 g/dL — ABNORMAL LOW (ref 3.5–5.0)
Alkaline Phosphatase: 131 U/L — ABNORMAL HIGH (ref 38–126)
Anion gap: 9 (ref 5–15)
BUN: 13 mg/dL (ref 8–23)
CO2: 29 mmol/L (ref 22–32)
Calcium: 8.1 mg/dL — ABNORMAL LOW (ref 8.9–10.3)
Chloride: 97 mmol/L — ABNORMAL LOW (ref 98–111)
Creatinine: 0.45 mg/dL (ref 0.44–1.00)
GFR, Estimated: >60 mL/min (ref >60)
Glucose, Bld: 150 mg/dL — ABNORMAL HIGH (ref 70–99)
Potassium: 3.5 mmol/L (ref 3.5–5.1)
Sodium: 135 mmol/L (ref 135–145)
Total Bilirubin: 0.4 mg/dL (ref 0.3–1.2)
Total Protein: 6.7 g/dL (ref 6.5–8.1)

## 2023-03-14 MED ORDER — SODIUM CHLORIDE 0.9 % IV SOLN
Freq: Once | INTRAVENOUS | Status: AC
  Filled 2023-03-14: qty 250

## 2023-03-14 MED ORDER — MORPHINE SULFATE (PF) 2 MG/ML IV SOLN
4.0000 mg | Freq: Once | INTRAVENOUS | Status: AC
  Administered 2023-03-14: 4 mg via INTRAVENOUS
  Filled 2023-03-14: qty 2

## 2023-03-14 MED ORDER — SODIUM CHLORIDE 0.9 % IV SOLN: 5.0000 mg | Freq: Once | INTRAVENOUS | Status: DC

## 2023-03-14 MED ORDER — DEXAMETHASONE SODIUM PHOSPHATE 10 MG/ML IJ SOLN
5.0000 mg | Freq: Once | INTRAMUSCULAR | Status: AC
  Administered 2023-03-14: 5 mg via INTRAVENOUS
  Filled 2023-03-14: qty 1

## 2023-03-14 NOTE — Progress Notes (Signed)
Hematology/Oncology Progress Note [phone] [phone]  REASON FOR VISIT Follow up for metastatic lung cancer.   ASSESSMENT & PLAN:   Cancer Staging  Primary malignant neoplasm of lung metastatic to other site [facility]) Staging form: Lung, AJCC 8th Edition - Clinical stage from 11/04/2020: Stage IV (cT4, cN3, cM1) - Signed by Rickard Patience, MD on 11/04/2020   No problem-specific Assessment & Plan notes found for this encounter.   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 cT4 N3 M1. KRASG12c mutation. Currently on Lumakras (high TMB, TPS < 1%). CT c/a/p on 03/08/23 was independently reviewed and shows progressive disease. Dr Cathie Hoops plans to rotate to third line  immunotherapy with tecentriq. Plan to start on 4/4. Liquid biopsy NGS was sent.  Tachycardia- multifactorial. Poor intake. Plan for IV fluids today Cancer related pain- currently well controlled with oxycontin and oxycodone.  Weight loss- secondary to malignancy & poor appetite. Continue appetite stimulants and f/u with nutritionist.  Goals of care- treatment given with palliative intent.   Disposition:  Follow up with Dr Cathie Hoops as scheduled- la  No orders of the defined types were placed in this encounter.  All questions were answered. The patient knows to call the clinic with any problems, questions or concerns.  Brittney Masse, DNP, AGNP-C, AOCNP [facility] [phone] (clinic) 03/14/2023    PERTINENT ONCOLOGY HISTORY 62 y.o. female with past medical history including GERD, COPD, current everyday smoker presents for follow-up of lung mass and pleural effusion. 10/20/2020, patient was brought to ED via EMS due to generalized weakness, dizziness, chest pain shortness of breath.  She was recently diagnosed with COPD approximately 1 month ago by primary care provider.  Also unintentional weight loss during the last few months. Image work-up showed  right-sided pleural effusion.  She underwent right thoracentesis.  With removal of 2.4 L of fluid. 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  Metastatic disease to low cervical/thoracic nodes, lung, right pleural space and bones.  Moderate to large right pleural effusion.  SVC narrowing. 10/21/2020 MRI brain is negative for metastasis.  Mild chronic microvascular ischemic changes in the white matter.  Negative for acute infarct Regarding to the SVC narrowing, patient was seen by vascular surgeon and was recommended no intervention inpatient.  Patient to follow-up outpatient with vascular surgeon for evaluation. Patient also was treated for COPD exacerbation with hypoxia on exertion. Qualifies for home oxygen and hospitalist arrange home health and a nebulizer.  Patient was given a course of prednisone taper and empiric Levaquin. Patient was discharged and present today to follow-up with cytology results and further management plan  10/27/2020 PET scan showed right upper lobe primary bronchogenic mass with direct invasion into mediastinum. Metastatic disease to the right pleural space, lung, bone, nodes of the chest and less so lower leg/upper abdomen. Hypermetabolic him along the course of the right axillary vein with concurrent subtle hyper attenuation within the axillary vein and SVC.  This continues to the level of SVC narrowing.  Suspicious for SVC occlusion and developing thrombus. Moderate right pleural effusion and small pericardial effusion.  # # left supraclavicular mass biopsy- pathology is positive for adenocarcinoma. positive for CK7, with patchy, weak CK20.  They are  negative for TTF1, NapsinA, GATA3, CDX2, and Pax8.  The findings are  nonspecific; but may be compatible with a poorly differentiated  adenocarcinoma of lung origin, especially given the imaging findings  She also had another repeat  thoracentesis and fluid cytology showed malignancy.  #NGS showed AXIN 1 [id number], DIS2 M140fs, KRAS G12C, PRDM1 M1 [id number]*, [id number], MS stable, TPS <1%   11/14/2020- 12/12/2020.  #Bronchial obstruction and SVC occlusion.  s/p palliative radiation #After 2 cycles of chemtherapy, patient had CT scan done due to shortness of breath.  01/05/2021, CT chest angiogram PE protocol showed no evidence of pulmonary embolism.  Stable disease.  Right upper lobe mass about 6.7 x 5.3 cm.  Widespread metastatic disease, lung and bone involvement. Interval development of right-sided hydronephrosis. 03/06/2021 finished T2 palliative radiation 03/22/2021, 6 cycles of carboplatin/Alimta/bevacizumab.  Infusion reaction of carboplatin during cycle 6.  Carboplatin discontinued. 04/07/2021 CT chest abdomen pelvis-partial response Mild decrease in size of right lung mass, mildly decreased mediastinal and hilar adenopathy.  Extensive right-sided pleural metastasis unchanged.  Similar appearance of diffuse bilateral pulmonary nodules.  Multifocal lytic sclerotic bone metastasis.  New superior endplate deformity of T6 04/12/2021 - 09/08/2021, patient was continued on bevacizumab and Alimta.  09/21/2021, bone scan showed multifocal abnormal uptake including mid to distal shaft of right femur, left femoral neck and trochanter, left sacroiliac region, left ischium.  Focal activity at the proximal tibia on the left.  Multi focal heterogeneous bilateral rib activity.  Focal activity at the sternum. 09/27/2021, CT chest abdomen pelvis with contrast showed progression of infrahilar left lower lobe with a concerning clearly progressive masslike consolidation opacity.  Mild progression of small left pleural effusion.  Similar experience of extensive irregular right-sided pleural disease.  Similar experience of bony metastasis.  09/29/2021 extra cycle of maintenance bevacizumab and Alimta while waiting for approval of  Lumakras  10/20/2021 started on Lumakras  01/09/2022, CT chest abdomen pelvis with contrast showed no substantial interval changes in exam.  Stable disease. 03/20/2022, CT chest abdomen pelvis with contrast showed unchanged circumferential pleural thickening/pleural nodularity of the right hemithorax, left infrahilar mass and bilateral pulmonary nodules.  Stable right perihilar consolidation likely postradiation changes.  Unchanged numerous sclerotic osseous lesions.  Aortic atherosclerosis.  10/12/22 CT chest abdomen pelvis w contrast 1. No significant change in post treatment appearance of the chest.Perihilar post radiation fibrosis and consolidation of the right lung, mass of the superior segment left lower lobe, extensive right-greater-than-left pleural thickening and nodularity, pleural effusions, and numerous bilateral pulmonary nodules are unchanged. 2. Unchanged matted post treatment appearance of soft tissue throughout the mediastinum and right hilum. Unchanged enlarged left epicardial lymph nodes without other discretely visualized lymph nodes in the chest. 3. Similar appearance of a lytic osseous metastatic lesion of the base of the right femoral neck, with additional unchanged widespread  sclerotic osseous metastatic disease throughout the included axial and proximal appendicular skeleton. 4. No evidence of new metastatic disease nor soft tissue metastatic disease in the abdomen or pelvis.   12/24/2022 CT chest abdomen pelvis with contrast showed Bilateral peripheral pleural nodules are mildly increased in size.  Bilateral small effusions.  Stable consolidation at the left lung base.  Stable ill-defined tissue in the mediastinum suggesting treated tumor infiltrates. no evidence of metastatic disease in abdomen or pelvis.  Stable sclerotic metastasis in the spine and pelvis.  Stable lytic lesion in the right femoral neck may be at risk of pathological fracture. 01/01/2023 -01/02/2023 s/p  IM nail  with Dr. Odis Luster on 01/01/2023  01/11/2023-01/18/2023 admitted for acute COPD exacerbation, patient received antibiotics, bronchodilators and steroids.  Lumakras was held since that admission. CT angio chest PE protocol showed no acute pulmonary embolus.  Stable appearance of the chest from earlier this month.  Ill-defined soft tissue planes within the mediastinum and central right lung are unchanged from prior.  Stable osseous metastatic disease. 01/17/2023 CT angio chest pulmonary embolism protocol showed no acute PE.  Slight interval increase in the size of pleural and parenchymal nodularity seen previously.  Stable diffuse sclerotic bone metastasis.  Aortic atherosclerosis.  01/21/2023 - 01/22/2023, ER visits for dyspnea, patient was discharged with home oxygen.   INTERVAL HISTORY LARK LANGENFELD is a 62 y.o. female with above history of metastatic lung cancer, currently on 2nd line lumakras who returns to clinic for discussion of CT results and possible iv fluids for chemo follow up. She has ongoing nausea but no worse. Continues to use oxygen. Cough and shortness of breath is no worse. Weight is back up.    Review of Systems  Constitutional:  Positive for appetite change and fatigue. Negative for chills, fever and unexpected weight change.  HENT:   Negative for mouth sores and sore throat.   Eyes:  Negative for eye problems.  Respiratory:  Positive for shortness of breath. Negative for chest tightness and cough.   Cardiovascular:  Negative for chest pain.  Gastrointestinal:  Negative for abdominal distention, abdominal pain, blood in stool, constipation, diarrhea and nausea.  Endocrine: Negative for hot flashes.  Genitourinary:  Negative for difficulty urinating and frequency.   Musculoskeletal:  Negative for arthralgias.       Right thigh pain  Skin:  Negative for itching and rash.  Neurological:  Negative for extremity weakness, headaches and light-headedness.  Hematological:  Negative for  adenopathy.  Psychiatric/Behavioral:  Negative for confusion and sleep disturbance.      Allergies  Allergen Reactions   Carboplatin Shortness Of Breath, Itching and Cough    Past Medical History:  Diagnosis Date   COPD (chronic obstructive pulmonary disease) (HCC)    GERD (gastroesophageal reflux disease)    Metastatic lung cancer (metastasis from lung to other site) (HCC) 11/02/2020    Past Surgical History:  Procedure Laterality Date   COLONOSCOPY WITH ESOPHAGOGASTRODUODENOSCOPY (EGD)     COLONOSCOPY WITH PROPOFOL N/A 11/14/2015   Procedure: COLONOSCOPY WITH PROPOFOL;  Surgeon: Wallace Cullens, MD;  Location: [facility] ENDOSCOPY;  Service: Gastroenterology;  Laterality: N/A;   ESOPHAGOGASTRODUODENOSCOPY (EGD) WITH PROPOFOL N/A 11/14/2015   Procedure: ESOPHAGOGASTRODUODENOSCOPY (EGD) WITH PROPOFOL;  Surgeon: Wallace Cullens, MD;  Location: [facility] ENDOSCOPY;  Service: Gastroenterology;  Laterality: N/A;   FEMUR IM NAIL Right 01/01/2023   Procedure: TREATMENT, INTERTROCHANTERIC FEMORAL FRACTURE, WITH INTRAMEDULLARY IMPLANT (SURG);  Surgeon: Lyndle Herrlich, MD;  Location: [facility] ORS;  Service: Orthopedics;  Laterality: Right;    Social History   Socioeconomic History   Marital status: Widowed    Spouse name: Not on file   Number of children: Not on file   Years of education: Not on file   Highest education level: Not on file  Occupational History   Not on file  Tobacco Use   Smoking status: Former    Packs/day: 1.00    Years: 40.00    Additional pack years: 0.00    Total pack years: 40.00    Types: Cigarettes    Quit date: 09/2021    Years since quitting: 1.4   Smokeless tobacco: Never  Vaping Use   Vaping Use: Never used  Substance and Sexual Activity   Alcohol use: Never   Drug use: Never   Sexual activity: Not Currently  Other Topics Concern  Not on file  Social History Narrative   Not on file   Social Determinants of Health   Financial Resource Strain: Low Risk  (02/19/2023)    Overall Financial Resource Strain (CARDIA)    Difficulty of Paying Living Expenses: Not hard at all  Food Insecurity: No Food Insecurity (01/12/2023)   Hunger Vital Sign    Worried About Running Out of Food in the Last Year: Never true    Ran Out of Food in the Last Year: Never true  Transportation Needs: No Transportation Needs (01/12/2023)   PRAPARE - Administrator, Civil Service (Medical): No    Lack of Transportation (Non-Medical): No  Physical Activity: Inactive (02/19/2023)   Exercise Vital Sign    Days of Exercise per Week: 0 days    Minutes of Exercise per Session: 0 min  Stress: Stress Concern Present (02/19/2023)   [employer] of Occupational Health - Occupational Stress Questionnaire    Feeling of Stress : To some extent  Social Connections: Moderately Integrated (02/19/2023)   Social Connection and Isolation Panel [NHANES]    Frequency of Communication with Friends and Family: More than three times a week    Frequency of Social Gatherings with Friends and Family: More than three times a week    Attends Religious Services: 1 to 4 times per year    Active Member of [employer] or Organizations: Yes    Attends Banker Meetings: Never    Marital Status: Widowed  Intimate Partner Violence: Not At Risk (01/12/2023)   Humiliation, Afraid, Rape, and Kick questionnaire    Fear of Current or Ex-Partner: No    Emotionally Abused: No    Physically Abused: No    Sexually Abused: No    Family History  Problem Relation Age of Onset   Breast cancer Cousin 67   Diabetes type II Mother    Hypertension Mother    Colon cancer Father    Hypertension Father      Current Outpatient Medications:    acetaminophen (TYLENOL) 500 MG tablet, Take 1,000 mg by mouth every 8 (eight) hours as needed for moderate pain. , Disp: , Rfl:    albuterol (VENTOLIN HFA) 108 (90 Base) MCG/ACT inhaler, Inhale 2 puffs into the lungs every 6 (six) hours as needed for wheezing or  shortness of breath., Disp: 8 g, Rfl: 2   benzonatate (TESSALON) 100 MG capsule, Take 1 capsule by mouth three times daily as needed for cough, Disp: 60 capsule, Rfl: 3   dexamethasone (DECADRON) 2 MG tablet, Take 1 tablet (2 mg total) by mouth daily., Disp: 14 tablet, Rfl: 0   dronabinol ([name]) 5 MG capsule, Take 1 capsule (5 mg total) by mouth 2 (two) times daily before a meal., Disp: 60 capsule, Rfl: 0   gabapentin (NEURONTIN) 100 MG capsule, Take 1 capsule (100 mg total) by mouth 3 (three) times daily., Disp: 90 capsule, Rfl: 0   ipratropium-albuterol (DUONEB) 0.5-2.5 (3) MG/3ML SOLN, Take 3 mLs by nebulization 3 (three) times daily., Disp: 360 mL, Rfl: 1   LORazepam (ATIVAN) 0.5 MG tablet, Take 1 tablet (0.5 mg total) by mouth every 8 (eight) hours., Disp: 30 tablet, Rfl: 0   naloxone (NARCAN) nasal spray 4 mg/0.1 mL, For suspected overdose of narcotics., Disp: 1 each, Rfl: 0   omeprazole (PRILOSEC) 20 MG capsule, Take 1 capsule (20 mg total) by mouth daily as needed. Take Lumakras 4 hr before or 10 hr after taking omeprazole., Disp: 30 capsule,  Rfl: 0   oxyCODONE (OXY IR/ROXICODONE) 5 MG immediate release tablet, Take 1-2 tablets (5-10 mg total) by mouth every 4 (four) hours as needed for moderate pain or severe pain., Disp: 120 tablet, Rfl: 0   oxyCODONE (OXYCONTIN) 10 mg 12 hr tablet, Take 1 tablet (10 mg total) by mouth every 12 (twelve) hours., Disp: 60 tablet, Rfl: 0   protein supplement shake (PREMIER PROTEIN) LIQD, Take 11 oz by mouth daily., Disp: , Rfl:    sennosides-docusate sodium (SENOKOT-S) 8.6-50 MG tablet, Take 1 tablet by mouth daily., Disp: , Rfl:    fluticasone-salmeterol (ADVAIR DISKUS) 250-50 MCG/ACT AEPB, Inhale 1 puff into the lungs in the morning and at bedtime., Disp: 60 each, Rfl: 0  Physical exam: ECOG 2 Vitals:   03/14/23 1436  BP: (!) 107/59  Pulse: (!) 122  Temp: (!) 96.4 F (35.8 C)  TempSrc: Tympanic  Weight: 112 lb (50.8 kg)   Physical  Exam Constitutional:      General: She is not in acute distress.    Appearance: She is ill-appearing.     Comments: Thin built female walks independently  HENT:     Head: Normocephalic and atraumatic.  Eyes:     General: No scleral icterus. Cardiovascular:     Rate and Rhythm: Regular rhythm. Tachycardia present.     Heart sounds: Normal heart sounds.  Pulmonary:     Effort: Pulmonary effort is normal. No respiratory distress.     Breath sounds: No wheezing.     Comments: Slightly decreased breath sound bilaterally.  Abdominal:     General: Bowel sounds are normal. There is no distension.     Palpations: Abdomen is soft.  Musculoskeletal:        General: No deformity. Normal range of motion.     Cervical back: Normal range of motion and neck supple.  Skin:    General: Skin is warm and dry.     Findings: No erythema or rash.  Neurological:     Mental Status: She is alert and oriented to person, place, and time. Mental status is at baseline.     Cranial Nerves: No cranial nerve deficit.     Coordination: Coordination normal.  Psychiatric:        Mood and Affect: Mood normal.    LABORATORY STUDIES     Latest Ref Rng & Units 03/14/2023    2:16 PM 03/07/2023    1:48 PM 03/02/2023    8:34 PM  CBC  WBC 4.0 - 10.5 K/uL 5.9  5.0  7.1   Hemoglobin 12.0 - 15.0 g/dL 16.1  09.6  04.5   Hematocrit 36.0 - 46.0 % 35.8  38.0  35.5   Platelets 150 - 400 K/uL 198  220  218       Latest Ref Rng & Units 03/14/2023    2:16 PM 03/07/2023    1:48 PM 03/02/2023    8:34 PM  CMP  Glucose 70 - 99 mg/dL 409  811  914   BUN 8 - 23 mg/dL 13  16  13    Creatinine 0.44 - 1.00 mg/dL 7.82  9.56  2.13   Sodium 135 - 145 mmol/L 135  135  132   Potassium 3.5 - 5.1 mmol/L 3.5  3.7  3.7   Chloride 98 - 111 mmol/L 97  98  96   CO2 22 - 32 mmol/L 29  30  30    Calcium 8.9 - 10.3 mg/dL 8.1  8.7  8.5  Total Protein 6.5 - 8.1 g/dL 6.7  7.2    Total Bilirubin 0.3 - 1.2 mg/dL 0.4  0.4    Alkaline Phos 38 -  126 U/L 131  165    AST 15 - 41 U/L 21  18    ALT 0 - 44 U/L 14  14       RADIOGRAPHIC STUDIES: I have personally reviewed the radiological images as listed and agreed with the findings in the report. CT CHEST ABDOMEN PELVIS W CONTRAST  Result Date: 03/08/2023 CLINICAL DATA:  Lung cancer restaging * Tracking Code: BO * EXAM: CT CHEST, ABDOMEN, AND PELVIS WITH CONTRAST TECHNIQUE: Multidetector CT imaging of the chest, abdomen and pelvis was performed following the standard protocol during bolus administration of intravenous contrast. RADIATION DOSE REDUCTION: This exam was performed according to the departmental dose-optimization program which includes automated exposure control, adjustment of the mA and/or kV according to patient size and/or use of iterative reconstruction technique. CONTRAST:  80mL OMNIPAQUE IOHEXOL 300 MG/ML  SOLN COMPARISON:  Multiple exams, including 01/17/2023 and 01/11/2023 FINDINGS: CT CHEST FINDINGS Cardiovascular: Aortic and branch vessel atherosclerotic vascular calcification. Mediastinum/Nodes: Stranding of mediastinal adipose tissue causing ill definition of of tumor margins in the mediastinum, with suspected tumor or confluent adenopathy in the right paratracheal, subcarinal, right hilar, and right infrahilar regions although with individual nodes not readily seen due to the ill-defined and infiltrative nature. Appearance generally similar to prior. Lungs/Pleura: Extensive consolidation and enhancement at the right lung apex, in the superior segment right lower lobe posterior to the hilum, and tracking in the right lower lobe, with extensive nodularity peripherally in the right lower lobe and right middle lobe compatible with tumor deposits. Some of the consolidation in the right paramediastinal region may be therapy related. In individual subpleural nodule in the right lower lobe measures 1.4 by 2.0 cm on image 83 series 4, previously 1.3 by 1.7 cm. An index right middle lobe  subpleural nodule measures 1.4 by 1.0 cm on image 101 of series 2, previously 1.3 by 0.9 cm. A lingular nodule on image 100 series 4 measures 1.4 by 1.1 cm, previously 1.3 by 1.1 cm. Other left upper lobe nodules appear stable. Volume loss and consolidation of most of the left lower lobe with enhancing pulmonary parenchymal tissue. There is likely some tumor within this consolidated lung. Extensive pleural rind at the right lung base with some probable anterior chest wall invasion for example in the fifth and sixth intercostal spaces. Musculoskeletal: Sclerosis and periosteal reaction in the right scapula favoring metastatic lesion. There are a few sclerotic rib lesions such as in the left lateral third rib compatible with metastatic disease. Sclerotic lesion in the manubrium is stable and compatible with metastatic disease. Scattered thoracic spine vertebral metastatic lesions appear stable. CT ABDOMEN PELVIS FINDINGS Hepatobiliary: Unremarkable Pancreas: Unremarkable Spleen: Unremarkable Adrenals/Urinary Tract: Adrenal glands appear normal. Single bilateral simple renal cysts are benign and warrant no individual follow up. Stomach/Bowel: Unremarkable Vascular/Lymphatic: Atherosclerosis is present, including aortoiliac atherosclerotic disease. Reproductive: Small calcified fundal fibroid the uterus. Other: No supplemental non-categorized findings. Musculoskeletal: Scattered sclerotic metastatic lesions in the lumbar spine, bony pelvis, and left proximal femur. Right hip screw. No significant change or progression of the osseous metastatic disease. Grade 1 degenerative anterolisthesis at L5-S1. IMPRESSION: 1. Individual lung nodules are mildly increased in size compared to 01/17/2023 compatible with mild progression. Extensive tumor rind at the right lung base with perihilar and peribronchovascular airspace opacities in the right lung which may  be from tumor or radiation therapy. Probable mild chest wall invasion in  the right anterior fifth and sixth intercostal spaces. 2. Stable indistinct infiltration in the right mediastinum probably from ill-defined adenopathy. 3. Stable volume loss and likely tumor in the left lower lobe. Substantial left pleural enhancement probably from left pleural malignancy. 4. Scattered sclerotic osseous metastatic lesions in the axial and appendicular skeleton, stable. Aortic Atherosclerosis (ICD10-I70.0). Electronically Signed   By: Gaylyn Rong M.D.   On: 03/08/2023 14:10   DG Chest Portable 1 View  Result Date: 03/02/2023 CLINICAL DATA:  Shortness of breath EXAM: PORTABLE CHEST 1 VIEW COMPARISON:  01/21/2023, CT 01/17/2023, chest x-ray 12/21/2022, 10/31/2020 FINDINGS: Similar appearance of right greater than left pleural effusions. Distortion in the right apex and perihilar region as before. Bilateral pulmonary nodules corresponding to history of metastatic disease. Scattered foci of sclerosis within the scapula corresponding to history of skeletal metastatic disease. IMPRESSION: Overall stable chest radiograph compared with 01/21/2023. Bilateral effusions, pulmonary metastatic disease and right perihilar and apical distortion do not show significant interval change. Electronically Signed   By: Jasmine Pang M.D.   On: 03/02/2023 22:46   US Venous Img Lower Bilateral (DVT)  Result Date: 01/21/2023 CLINICAL DATA:  Shortness of breath. EXAM: BILATERAL LOWER EXTREMITY VENOUS DOPPLER ULTRASOUND TECHNIQUE: Gray-scale sonography with compression, as well as color and duplex ultrasound, were performed to evaluate the deep venous system(s) from the level of the common femoral vein through the popliteal and proximal calf veins. COMPARISON:  None Available. FINDINGS: VENOUS Normal compressibility of the common femoral, superficial femoral, and popliteal veins, as well as the visualized calf veins. Visualized portions of profunda femoral vein and great saphenous vein unremarkable. No filling  defects to suggest DVT on grayscale or color Doppler imaging. Doppler waveforms show normal direction of venous flow, normal respiratory plasticity and response to augmentation. OTHER None. Limitations: none IMPRESSION: No sonographic evidence of bilateral lower extremity DVT. Electronically Signed   By: Narda Rutherford M.D.   On: 01/21/2023 20:55   DG Chest 2 View  Result Date: 01/21/2023 CLINICAL DATA:  Shortness of breath with movement and walking, stage IV lung cancer EXAM: CHEST - 2 VIEW COMPARISON:  Earlier study of 01/21/2023 at 1033 hours FINDINGS: Normal heart size, mediastinal contours, and pulmonary vascularity. Changes of COPD with chronic opacity at RIGHT apex and perihilar region likely related to prior therapy. Persistent small bibasilar pleural effusions greater on RIGHT. Nodular foci in both lungs consistent with known metastatic disease. No pneumothorax. Bones demineralized with chronic height loss of an upper thoracic vertebra unchanged. Scattered sclerotic foci consistent with osseous metastases in spine, RIGHT scapula, proximal RIGHT humerus. IMPRESSION: Post therapy changes in the RIGHT lung. Pulmonary metastatic disease with small bibasilar effusions greater on RIGHT. Osseous metastases. Emphysema (ICD10-J43.9). Electronically Signed   By: Ulyses Southward M.D.   On: 01/21/2023 17:16   DG Chest Portable 1 View  Result Date: 01/21/2023 CLINICAL DATA:  Shortness of breath on exertion. Stage IV lung cancer. EXAM: PORTABLE CHEST 1 VIEW COMPARISON:  01/15/2023 and CT chest 01/17/2023. FINDINGS: Trachea is midline. Heart size normal. Extensive post radiation in the upper right hemithorax and right perihilar region. Bilateral pleuroparenchymal nodularity, as on comparison CT. Small bilateral pleural effusions. Findings are unchanged from 01/15/2023. IMPRESSION: 1. Pulmonary metastatic disease. 2. Small bilateral pleural effusions. 3. Post radiation changes in the upper right hemithorax and right  perihilar region. 4. No superimposed acute abnormality. Electronically Signed   By: Leanna Battles  M.D.   On: 01/21/2023 10:46   CT Angio Chest Pulmonary Embolism (PE) W or WO Contrast  Result Date: 01/17/2023 CLINICAL DATA:  COPD, history of metastatic lung cancer, worsening dyspnea and cough EXAM: CT ANGIOGRAPHY CHEST WITH CONTRAST TECHNIQUE: Multidetector CT imaging of the chest was performed using the standard protocol during bolus administration of intravenous contrast. Multiplanar CT image reconstructions and MIPs were obtained to evaluate the vascular anatomy. RADIATION DOSE REDUCTION: This exam was performed according to the departmental dose-optimization program which includes automated exposure control, adjustment of the mA and/or kV according to patient size and/or use of iterative reconstruction technique. CONTRAST:  75mL OMNIPAQUE IOHEXOL 350 MG/ML SOLN COMPARISON:  01/11/2023 FINDINGS: Cardiovascular: This is a technically adequate evaluation of the pulmonary vasculature. No filling defects or pulmonary emboli. The heart is unremarkable without pericardial effusion. No evidence of thoracic aortic aneurysm or dissection. Stable atherosclerosis of the aorta and coronary vasculature. 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 Mediastinum/Nodes: Loss of normal fat planes throughout the mediastinum may reflect post therapeutic change or sequela of metastatic disease. Discrete adenopathy or mass is difficult to measure, but the appearance is stable since the previous evaluation. Thyroid, trachea, and esophagus are grossly unremarkable. Lungs/Pleura: Chronic areas of consolidation within the bilateral lower lobes are again noted and unchanged. Extensive pleural nodularity again noted bilaterally, right greater than left. Chronic scarring and bronchovascular thickening within the  right hilar region unchanged. Index nodules measured previously are as follows: Left upper lobe, image 50/6, 8 mm.  Previously 7 mm. Right lower lobe, image 103/6, 16 mm.  Previously 14 mm. Right middle lobe, image 103/6, 13 mm.  Previously 12 mm. Left upper lobe, image 99/6, 13 mm.  Previously 12 mm. Trace bilateral pleural effusions are again noted.  No pneumothorax. Upper Abdomen: No acute abnormality. Musculoskeletal: Stable sclerotic metastatic disease within the bilateral scapula, sternum, and thoracic spine. There are no acute displaced fractures. Reconstructed images demonstrate no additional findings. Review of the MIP images confirms the above findings. IMPRESSION: 1. No evidence of pulmonary embolus. 2. Findings consistent with known metastatic lung cancer, with slight interval increase in size of the pleural and parenchymal nodularity seen previously. 3. Stable diffuse sclerotic bony metastases. 4.  Aortic Atherosclerosis (ICD10-I70.0). Electronically Signed   By: Sharlet Salina M.D.   On: 01/17/2023 16:37   ECHOCARDIOGRAM COMPLETE  Result Date: 01/16/2023    ECHOCARDIOGRAM REPORT   Patient Name:   Brittney Fisher Date of Exam: 01/16/2023 Medical Rec #:  [id number]        Height:       71.0 in Accession #:    [id number]       Weight:       133.8 lb Date of Birth:  [birth year]        BSA:          1.778 m Patient Age:    61 years         BP:           129/71 mmHg Patient Gender: F                HR:           110 bpm. Exam Location:  [facility] Procedure: 2D Echo, Color Doppler and Cardiac Doppler Indications:    Acute respiratory distress R06.03  History:        Patient has no prior history  of Echocardiogram examinations.                 COPD.  Sonographer:    Cristela Blue Referring Phys: [id number] [facility] Diagnosing      Debbe Odea MD Phys:  Sonographer Comments: Image acquisition challenging due to patient body habitus. IMPRESSIONS  1. Left ventricular ejection fraction, by estimation, is 60 to  65%. The left ventricle has normal function. The left ventricle has no regional wall motion abnormalities. Left ventricular diastolic parameters are consistent with Grade I diastolic dysfunction (impaired relaxation).  2. Right ventricular systolic function is normal. The right ventricular size is normal.  3. The mitral valve is normal in structure. No evidence of mitral valve regurgitation.  4. The aortic valve is tricuspid. Aortic valve regurgitation is not visualized. FINDINGS  Left Ventricle: Left ventricular ejection fraction, by estimation, is 60 to 65%. The left ventricle has normal function. The left ventricle has no regional wall motion abnormalities. The left ventricular internal cavity size was normal in size. There is  no left ventricular hypertrophy. Left ventricular diastolic parameters are consistent with Grade I diastolic dysfunction (impaired relaxation). Right Ventricle: The right ventricular size is normal. No increase in right ventricular wall thickness. Right ventricular systolic function is normal. Left Atrium: Left atrial size was normal in size. Right Atrium: Right atrial size was normal in size. Pericardium: There is no evidence of pericardial effusion. Mitral Valve: The mitral valve is normal in structure. No evidence of mitral valve regurgitation. Tricuspid Valve: The tricuspid valve is normal in structure. Tricuspid valve regurgitation is not demonstrated. Aortic Valve: The aortic valve is tricuspid. Aortic valve regurgitation is not visualized. Aortic valve mean gradient measures 3.0 mmHg. Aortic valve peak gradient measures 4.8 mmHg. Aortic valve area, by VTI measures 2.30 cm. Pulmonic Valve: The pulmonic valve was normal in structure. Pulmonic valve regurgitation is not visualized. Aorta: The aortic root is normal in size and structure. Venous: The inferior vena cava was not well visualized. IAS/Shunts: No atrial level shunt detected by color flow Doppler.  LEFT VENTRICLE PLAX 2D  LVIDd:         3.20 cm   Diastology LVIDs:         1.90 cm   LV e' medial:    7.07 cm/s LV PW:         0.90 cm   LV E/e' medial:  9.1 LV IVS:        1.10 cm   LV e' lateral:   9.25 cm/s LVOT diam:     1.90 cm   LV E/e' lateral: 7.0 LV SV:         37 LV SV Index:   21 LVOT Area:     2.84 cm  RIGHT VENTRICLE RV Basal diam:  3.60 cm RV Mid diam:    2.70 cm RV S prime:     18.40 cm/s TAPSE (M-mode): 2.1 cm LEFT ATRIUM           Index       RIGHT ATRIUM          Index LA diam:      1.50 cm 0.84 cm/m  RA Area:     9.50 cm LA Vol (A2C): 6.9 ml  3.88 ml/m  RA Volume:   17.30 ml 9.73 ml/m LA Vol (A4C): 10.0 ml 5.63 ml/m  AORTIC VALVE AV Area (Vmax):    2.35 cm AV Area (Vmean):   2.05 cm AV Area (VTI):  2.30 cm AV Vmax:           109.00 cm/s AV Vmean:          76.900 cm/s AV VTI:            0.160 m AV Peak Grad:      4.8 mmHg AV Mean Grad:      3.0 mmHg LVOT Vmax:         90.20 cm/s LVOT Vmean:        55.600 cm/s LVOT VTI:          0.130 m LVOT/AV VTI ratio: 0.81  AORTA Ao Root diam: 3.20 cm MITRAL VALVE               TRICUSPID VALVE MV Area (PHT): 6.90 cm    TR Peak grad:   31.6 mmHg MV Decel Time: 110 msec    TR Vmax:        281.00 cm/s MV E velocity: 64.50 cm/s MV A velocity: 96.90 cm/s  SHUNTS MV E/A ratio:  0.67        Systemic VTI:  0.13 m                            Systemic Diam: 1.90 cm Debbe Odea MD Electronically signed by Debbe Odea MD Signature Date/Time: 01/16/2023/4:29:24 PM    Final    DG Chest Port 1 View  Result Date: 01/15/2023 CLINICAL DATA:  Shortness of breath.  COPD.  Metastatic lung cancer. EXAM: PORTABLE CHEST 1 VIEW COMPARISON:  01/11/2023 FINDINGS: Heart size remains normal. Chronic volume loss and scarring in the right upper chest remains stable. Chronic bilateral pleural blunting remains stable. Chronic volume loss/infiltrate in both lower lobes. Compared to the study of 4 days ago, findings appear quite similar. No new process is identified. IMPRESSION: No significant  change since the study of 4 days ago. Chronic volume loss and scarring in the right upper chest. 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 Electronically Signed   By: Paulina Fusi M.D.   On: 01/15/2023 08:07   US Abdomen Limited RUQ (LIVER/GB)  Result Date: 01/12/2023 CLINICAL DATA:  62 year old female with history of abdominal pain. EXAM: ULTRASOUND ABDOMEN LIMITED RIGHT UPPER QUADRANT COMPARISON:  No priors. FINDINGS: Gallbladder: Gallbladder is moderately distended. No evidence of gallstones or biliary sludge. Gallbladder wall thickness is normal. No pericholecystic fluid. Per report from the sonographer, there was no sonographic Murphy's sign on examination. Common bile duct: Diameter: 3.4 mm. Liver: No focal lesion identified. Within normal limits in parenchymal echogenicity. Portal vein is patent on color Doppler imaging with normal direction of blood flow towards the liver. Other: Incidental imaging of the right kidney demonstrates an anechoic lesion with increased through transmission in the interpolar region, compatible with a large parapelvic cyst, estimated to measure approximately 4.3 x 4.0 x 2.8 cm (no imaging follow-up recommended). IMPRESSION: 1. No acute findings. Specifically, no gallstones and no evidence of acute cholecystitis. Electronically Signed   By: Trudie Reed M.D.   On: 01/12/2023 05:42   CT Angio Chest PE W and/or Wo Contrast  Result Date: 01/11/2023 CLINICAL DATA:  Pulmonary embolism (PE) suspected, high prob Patient with history of metastatic lung cancer. EXAM: CT ANGIOGRAPHY CHEST WITH CONTRAST TECHNIQUE: Multidetector CT imaging of the chest was performed using the standard protocol during bolus administration of intravenous contrast. Multiplanar CT image reconstructions and MIPs were obtained to evaluate the vascular  anatomy. RADIATION DOSE REDUCTION: This exam was performed according to the departmental dose-optimization program  which includes automated exposure control, adjustment of the mA and/or kV according to patient size and/or use of iterative reconstruction technique. CONTRAST:  75mL OMNIPAQUE IOHEXOL 350 MG/ML SOLN COMPARISON:  Radiograph earlier today. Staging chest CT 12/24/2022 FINDINGS: Cardiovascular: There are no filling defects within the pulmonary arteries to suggest pulmonary embolus. Aortic atherosclerosis and tortuosity. No acute aortic findings. Stable heart size. Trace pericardial effusion. Bilateral anterior chest wall collaterals, however no evidence of SVC occlusion. Mediastinum/Nodes: Ill-defined soft tissue planes within the mediastinum again seen without significant interval change. Ill-defined soft tissue density seen anterior to the trachea, at the level of the carina and adjacent to the right mainstem bronchus. 14 mm central left hilar lymph node versus pulmonary nodule. Patulous esophagus. Lungs/Pleura: Again seen pleural thickening the right lung apex, near circumferential throughout the right hemithorax with areas of pleural nodularity. Areas of peripheral pleural based nodularity in the right hemithorax are again seen. Representative right lower lobe pleural base nodule measures 15 x 12 mm series 7, image 97, unchanged by my retrospective measurement. Peri diaphragmatic nodule in the right lung base measures 14 mm series 7, image 111, also unchanged. Pleural based right middle lobe nodule measures 11 mm series 7, image 109, unchanged. Interstitial nodularity throughout the right lower lobe with bronchial thickening is stable. Ill-defined soft tissue density in the central right perihilar lung is unchanged. Small right pleural effusion. Consolidation in the left lower lobe, most prominent centrally, with heterogeneous parenchyma, stable from prior. Subpleural left upper lobe nodule measures 12 mm series 7, image 102, previously 10 mm. Subpleural left upper lobe nodule measuring 7 mm series 7, image 58,  previously 6 mm. Similar left pleural thickening with small left pleural effusion. No definite acute airspace disease. Upper Abdomen: Assessed on abdominopelvic CT earlier today. Musculoskeletal: Multifocal sclerotic osseous lesions typical of metastatic disease. Exaggerated upper thoracic kyphosis. Unchanged upper thoracic mild compression deformities. No new osseous findings. Review of the MIP images confirms the above findings. IMPRESSION: 1. No pulmonary embolus. 2. History of known lung cancer, with stable appearance of the chest from earlier this month. Recommend continued oncologic follow-up per protocol. 3. Ill-defined soft tissue planes within the mediastinum and central right lung are unchanged from prior exam. 4. Stable osseous metastatic disease. Aortic Atherosclerosis (ICD10-I70.0). Electronically Signed   By: Narda Rutherford M.D.   On: 01/11/2023 21:21   CT ABDOMEN PELVIS WO CONTRAST  Result Date: 01/11/2023 CLINICAL DATA:  Left lower quadrant abdominal pain. 2 days. History of non-small-cell lung cancer. EXAM: CT ABDOMEN AND PELVIS WITHOUT CONTRAST TECHNIQUE: Multidetector CT imaging of the abdomen and pelvis was performed following the standard protocol without IV contrast. RADIATION DOSE REDUCTION: This exam was performed according to the departmental dose-optimization program which includes automated exposure control, adjustment of the mA and/or kV according to patient size and/or use of iterative reconstruction technique. COMPARISON:  CT 12/24/2022. older CT sidewall. X-ray 01/11/2023 earlier FINDINGS: Lower chest: Once again at the lung bases are pleural effusions, pleural thickening and masslike areas with reticulonodular changes, similar to the prior examination. Please correlate with prior chest CT and clinical history of lung cancer. Pericardial effusion is also seen with some enlarged pre cardiophrenic lymph nodes Hepatobiliary: On this non IV contrast exam, the liver is grossly  preserved. The gallbladder is distended but unchanged from prior. Pancreas: Grossly the pancreas has preserved parenchyma. Spleen: Spleen is nonenlarged. Adrenals/Urinary Tract: Stable mild  thickening of the adrenal glands. Bilateral Bosniak 1 renal cysts are stable. No specific imaging follow-up of the cysts in the kidneys. No renal or ureteral stones clearly seen. Contracted urinary bladder. Stomach/Bowel: Stomach is nondilated. There is some luminal air and fluid. There is some high attenuation seen along the course of the colon with scattered stool. The colon is nondilated. The cecum is in the low right hemipelvis. Very redundant course to loops of colon. Normal appendix identified posterior cecum in the right hemipelvis as seen on coronal series 4, image 39. No clear adjacent inflammatory stranding along the colon. Small bowel is nondilated. Vascular/Lymphatic: Diffuse vascular calcifications along the aorta and branch vessels. Grossly preserved caliber IVC. Reproductive: Uterus and bilateral adnexa are unremarkable. There are some calcifications along the uterus, possible calcified fibroids. Other: Anasarca.  No clear ascites. Musculoskeletal: Scattered sclerotic bone metastases are identified. Please correlate with prior workup. Interval placement of dynamic right hip screw with acute surgical changes. Lytic lesion along the right femoral neck is again seen. Stranding. IMPRESSION: Study limited without the advantage of contrast and lack of intra-abdominal fat. No bowel obstruction, free air or free fluid. Diffuse scattered colonic stool. Normal appendix. Anasarca. Interval placement of a dynamic right hip screw transfixing the lucent lesion in the femoral neck. There is sclerotic bone metastases elsewhere as per prior history. Distended gallbladder but unchanged from previous. If there is concern of acute gallbladder pathology ultrasound may be useful. Extensive abnormality along the lung bases consistent  with known history of lung cancer. Please correlate with recent workup. Electronically Signed   By: Karen Kays M.D.   On: 01/11/2023 19:32   DG Chest Portable 1 View  Result Date: 01/11/2023 CLINICAL DATA:  Shortness of breath. Cancer. Evaluate for effusion or edema. EXAM: PORTABLE CHEST 1 VIEW COMPARISON:  Radiograph 12/21/2022. CT 12/24/2022 FINDINGS: Chronic volume loss in the right hemithorax. Chronic right apical opacity, ill-defined right basilar opacities and right basilar effusion. Left infrahilar opacity and small left effusion are similar to prior CT. No pulmonary edema. Stable heart size and mediastinal contours. No pneumothorax. Scattered sclerotic lesions are better appreciated on prior CT. IMPRESSION: 1. Stable radiographic appearance of the chest. Stable right lung volume loss with apical and basilar opacity and right pleural effusion from earlier this month. 2. Stable left basilar opacity and small left pleural effusion. 3. No pulmonary edema. Electronically Signed   By: Narda Rutherford M.D.   On: 01/11/2023 19:19   DG Abdomen 1 View  Result Date: 01/11/2023 CLINICAL DATA:  Constipation. Patient reports abdominal pain. EXAM: ABDOMEN - 1 VIEW COMPARISON:  CT 12/24/2022 FINDINGS: Added views of the abdomen obtained. No bowel dilatation to suggest obstruction. Moderate volume of stool in the colon, mild gaseous distension of splenic flexure. No radiopaque calculi or abnormal soft tissue calcifications. Surgical hardware in the right proximal femur is partially visualized. Stable sclerotic densities within the left proximal femur. Sclerotic lesions within the bony pelvis are only faintly visualized by radiograph. Known sclerotic metastatic disease. IMPRESSION: Nonobstructive bowel gas pattern. Moderate volume of colonic stool. Electronically Signed   By: Narda Rutherford M.D.   On: 01/11/2023 18:10   DG FEMUR, MIN 2 VIEWS RIGHT  Result Date: 01/01/2023 CLINICAL DATA:  Right intramedullary  nail EXAM: RIGHT FEMUR 2 VIEWS COMPARISON:  None Available. FINDINGS: Fluoroscopic images were obtained intraoperatively and submitted for post operative interpretation. Right intramedullary nail placement, 10 images were obtained with 1 minute and 27 seconds of fluoroscopy time and 8.23  mGy. Please see the performing provider's procedural report for further detail. IMPRESSION: Intraoperative fluoroscopic guidance for right intramedullary nail placement. Electronically Signed   By: Allegra Lai M.D.   On: 01/01/2023 14:57   DG C-Arm 1-60 Min-No Report  Result Date: 01/01/2023 Fluoroscopy was utilized by the requesting physician.  No radiographic interpretation.   DG C-Arm 1-60 Min-No Report  Result Date: 01/01/2023 Fluoroscopy was utilized by the requesting physician.  No radiographic interpretation.   CT CHEST ABDOMEN PELVIS W CONTRAST  Result Date: 12/24/2022 CLINICAL DATA:  Non-small cell lung cancer. Assess treatment response. Initial diagnosis November 2021. Chemotherapy radiation therapy. Immunotherapy completed. * Tracking Code: BO * EXAM: CT CHEST, ABDOMEN, AND PELVIS WITH CONTRAST TECHNIQUE: Multidetector CT imaging of the chest, abdomen and pelvis was performed following the standard protocol during bolus administration of intravenous contrast. RADIATION DOSE REDUCTION: This exam was performed according to the departmental dose-optimization program which includes automated exposure control, adjustment of the mA and/or kV according to patient size and/or use of iterative reconstruction technique. CONTRAST:  85mL OMNIPAQUE IOHEXOL 300 MG/ML  SOLN COMPARISON:  03/20/2022 FINDINGS: CT CHEST FINDINGS Cardiovascular: No significant vascular findings. Normal heart size. No pericardial effusion. Mediastinum/Nodes: Ill-defined tissue planes within the mediastinum consistent with tumor infiltrate. No change from comparison exam. No axillary supraclavicular adenopathy Lungs/Pleura: Peripheral nodules  in the RIGHT lower lobe and along the RIGHT lower lobe pleural surface. For example lateral nodule in the RIGHT lower lobe along the pleural surface measures 16 mm (image 86/3 compared to 12 mm. Nodule over the RIGHT hemidiaphragm measuring 13 mm (image 104/3) compares to 11 mm. Nodule in the RIGHT middle lobe pleural surface measures 11 mm (97/3 compared to 11 mm. There is bronchiectasis and peribronchial thickening in the RIGHT lobe similar prior. Several new peripheral nodules in the LEFT lung. For example LEFT upper lobe nodule measuring 10 mm (image 91/3) compares to 6 mm. Consolidation in the LEFT lower lobe is similar prior. RIGHT upper lobe nodule measuring 6 mm compares to 5 (image 46/3 Musculoskeletal: Stable sclerotic lesions in the thoracic spine CT ABDOMEN AND PELVIS FINDINGS Hepatobiliary: No focal hepatic lesion. No biliary ductal dilatation. Gallbladder is normal. Common bile duct is normal. Pancreas: Pancreas is normal. No ductal dilatation. No pancreatic inflammation. Spleen: Normal spleen Adrenals/urinary tract: Adrenal glands and kidneys are normal. The ureters and bladder normal. Stomach/Bowel: Stomach, small bowel, appendix, and cecum are normal. The colon and rectosigmoid colon are normal. Vascular/Lymphatic: Abdominal aorta is normal caliber with atherosclerotic calcification. There is no retroperitoneal or periportal lymphadenopathy. No pelvic lymphadenopathy. Reproductive: Uterus and adnexa unremarkable. Other: No peritoneal metastasis Musculoskeletal: Multiple round sclerotic lesions in the proximal femurs, iliac bones, sacrum and lower thoracic spine are unchanged. Lytic lesion within the RIGHT femoral neck measures 2.7 cm unchanged IMPRESSION: CHEST IMPRESSION: 1. Bilateral peripheral pleural nodules are mildly increased in size. 2. Bilateral small effusions. Stable consolidation at the LEFT lung base 3. Stable ill-defined tissue in the mediastinum suggesting treated tumor infiltrate.  PELVIS IMPRESSION: 1. No evidence of metastatic disease in the abdomen pelvis. 2. Stable sclerotic metastasis in the spine and pelvis. 3. Stable lytic lesion in the RIGHT femoral neck may be at risk for pathologic fracture. 4.  Aortic Atherosclerosis (ICD10-I70.0). Electronically Signed   By: Genevive Bi M.D.   On: 12/24/2022 14:06   [place] CHEST (PLEURAL EFFUSION)  Result Date: 12/21/2022 CLINICAL DATA:  Pleural effusion EXAM: CHEST ULTRASOUND COMPARISON:  None Available. FINDINGS: Focused sonographic exam of the bilateral  chest was performed for fluid assessment. Images demonstrate no significant pleural effusion on either the right or left side. No thoracentesis performed. IMPRESSION: No significant pleural effusion appreciated by ultrasound on either side. No thoracentesis performed. Electronically Signed   By: Olive Bass M.D.   On: 12/21/2022 12:32   DG Chest 2 View  Result Date: 12/21/2022 CLINICAL DATA:  History of lung cancer with several week history of shortness of breath EXAM: CHEST - 2 VIEW COMPARISON:  CT chest dated 10/12/2022 chest radiograph dated 12/30/2020 FINDINGS: Slightly low lung volumes. Persistent right apical opacity and asymmetric patchy right lung opacities. Multifocal right lung nodules are better evaluated on prior chest CT. New peripheral left upper lung nodular opacity. Small to moderate right pleural effusion. Trace left pleural effusion. The heart size and mediastinal contours are within normal limits. The visualized skeletal structures are unremarkable. IMPRESSION: 1. Persistent right apical opacity and asymmetric patchy right lung opacities. Multifocal right lung nodules are better evaluated on prior chest CT. 2. New peripheral left upper lung nodular opacity may represent a focus of infection or metastasis. 3. Small to moderate right pleural effusion. Trace left pleural effusion. Electronically Signed   By: Agustin Cree M.D.   On: 12/21/2022 09:03

## 2023-03-19 ENCOUNTER — Encounter: Payer: Self-pay | Admitting: Oncology

## 2023-03-19 NOTE — Telephone Encounter (Signed)
xF report sent to scan 

## 2023-03-20 ENCOUNTER — Encounter: Payer: Self-pay | Admitting: Oncology

## 2023-03-20 ENCOUNTER — Ambulatory Visit: Payer: Medicaid Other | Admitting: Internal Medicine

## 2023-03-21 ENCOUNTER — Encounter: Payer: Self-pay | Admitting: Oncology

## 2023-03-21 ENCOUNTER — Ambulatory Visit (INDEPENDENT_AMBULATORY_CARE_PROVIDER_SITE_OTHER): Payer: Medicare Other | Admitting: Pulmonary Disease

## 2023-03-21 ENCOUNTER — Inpatient Hospital Stay (HOSPITAL_BASED_OUTPATIENT_CLINIC_OR_DEPARTMENT_OTHER): Payer: Medicare Other | Admitting: Oncology

## 2023-03-21 ENCOUNTER — Inpatient Hospital Stay: Payer: Medicare Other

## 2023-03-21 ENCOUNTER — Inpatient Hospital Stay: Payer: Medicare Other | Attending: Radiation Oncology

## 2023-03-21 ENCOUNTER — Encounter: Payer: Self-pay | Admitting: Pulmonary Disease

## 2023-03-21 VITALS — BP 92/76 | HR 112 | Temp 96.0°F | Wt 115.1 lb

## 2023-03-21 VITALS — BP 110/72 | HR 126 | Temp 97.5°F | Ht 71.0 in | Wt 115.0 lb

## 2023-03-21 DIAGNOSIS — Z833 Family history of diabetes mellitus: Secondary | ICD-10-CM | POA: Insufficient documentation

## 2023-03-21 DIAGNOSIS — J209 Acute bronchitis, unspecified: Secondary | ICD-10-CM

## 2023-03-21 DIAGNOSIS — J449 Chronic obstructive pulmonary disease, unspecified: Secondary | ICD-10-CM | POA: Insufficient documentation

## 2023-03-21 DIAGNOSIS — C3490 Malignant neoplasm of unspecified part of unspecified bronchus or lung: Secondary | ICD-10-CM

## 2023-03-21 DIAGNOSIS — Z800 Family history of malignant neoplasm of digestive organs: Secondary | ICD-10-CM | POA: Insufficient documentation

## 2023-03-21 DIAGNOSIS — J9611 Chronic respiratory failure with hypoxia: Secondary | ICD-10-CM

## 2023-03-21 DIAGNOSIS — R64 Cachexia: Secondary | ICD-10-CM

## 2023-03-21 DIAGNOSIS — R0602 Shortness of breath: Secondary | ICD-10-CM

## 2023-03-21 DIAGNOSIS — R000 Tachycardia, unspecified: Secondary | ICD-10-CM

## 2023-03-21 DIAGNOSIS — E871 Hypo-osmolality and hyponatremia: Secondary | ICD-10-CM | POA: Diagnosis not present

## 2023-03-21 DIAGNOSIS — C7951 Secondary malignant neoplasm of bone: Secondary | ICD-10-CM | POA: Diagnosis present

## 2023-03-21 DIAGNOSIS — Z79899 Other long term (current) drug therapy: Secondary | ICD-10-CM | POA: Diagnosis not present

## 2023-03-21 DIAGNOSIS — C3491 Malignant neoplasm of unspecified part of right bronchus or lung: Secondary | ICD-10-CM | POA: Diagnosis not present

## 2023-03-21 DIAGNOSIS — Z803 Family history of malignant neoplasm of breast: Secondary | ICD-10-CM | POA: Insufficient documentation

## 2023-03-21 DIAGNOSIS — Z87891 Personal history of nicotine dependence: Secondary | ICD-10-CM | POA: Insufficient documentation

## 2023-03-21 DIAGNOSIS — J440 Chronic obstructive pulmonary disease with acute lower respiratory infection: Secondary | ICD-10-CM | POA: Diagnosis not present

## 2023-03-21 DIAGNOSIS — R5383 Other fatigue: Secondary | ICD-10-CM | POA: Insufficient documentation

## 2023-03-21 DIAGNOSIS — Z9981 Dependence on supplemental oxygen: Secondary | ICD-10-CM | POA: Insufficient documentation

## 2023-03-21 DIAGNOSIS — J910 Malignant pleural effusion: Secondary | ICD-10-CM | POA: Insufficient documentation

## 2023-03-21 DIAGNOSIS — Z8249 Family history of ischemic heart disease and other diseases of the circulatory system: Secondary | ICD-10-CM | POA: Diagnosis not present

## 2023-03-21 DIAGNOSIS — G893 Neoplasm related pain (acute) (chronic): Secondary | ICD-10-CM | POA: Diagnosis not present

## 2023-03-21 DIAGNOSIS — C3411 Malignant neoplasm of upper lobe, right bronchus or lung: Secondary | ICD-10-CM | POA: Insufficient documentation

## 2023-03-21 DIAGNOSIS — J9610 Chronic respiratory failure, unspecified whether with hypoxia or hypercapnia: Secondary | ICD-10-CM | POA: Insufficient documentation

## 2023-03-21 DIAGNOSIS — I700 Atherosclerosis of aorta: Secondary | ICD-10-CM | POA: Insufficient documentation

## 2023-03-21 DIAGNOSIS — E860 Dehydration: Secondary | ICD-10-CM | POA: Diagnosis present

## 2023-03-21 DIAGNOSIS — K219 Gastro-esophageal reflux disease without esophagitis: Secondary | ICD-10-CM | POA: Insufficient documentation

## 2023-03-21 DIAGNOSIS — R634 Abnormal weight loss: Secondary | ICD-10-CM | POA: Diagnosis not present

## 2023-03-21 LAB — CMP (CANCER CENTER ONLY)
ALT: 13 U/L (ref 0–44)
AST: 22 U/L (ref 15–41)
Albumin: 2.7 g/dL — ABNORMAL LOW (ref 3.5–5.0)
Alkaline Phosphatase: 118 U/L (ref 38–126)
Anion gap: 10 (ref 5–15)
BUN: 13 mg/dL (ref 8–23)
CO2: 29 mmol/L (ref 22–32)
Calcium: 8.4 mg/dL — ABNORMAL LOW (ref 8.9–10.3)
Chloride: 90 mmol/L — ABNORMAL LOW (ref 98–111)
Creatinine: 0.36 mg/dL — ABNORMAL LOW (ref 0.44–1.00)
GFR, Estimated: >60 mL/min (ref >60)
Glucose, Bld: 99 mg/dL (ref 70–99)
Potassium: 3.7 mmol/L (ref 3.5–5.1)
Sodium: 129 mmol/L — ABNORMAL LOW (ref 135–145)
Total Bilirubin: 0.4 mg/dL (ref 0.3–1.2)
Total Protein: 6.7 g/dL (ref 6.5–8.1)

## 2023-03-21 LAB — CBC WITH DIFFERENTIAL (CANCER CENTER ONLY)
Abs Immature Granulocytes: 0.02 10*3/uL (ref 0.00–0.07)
Basophils Absolute: 0 10*3/uL (ref 0.0–0.1)
Basophils Relative: 0 %
Eosinophils Absolute: 0 10*3/uL (ref 0.0–0.5)
Eosinophils Relative: 0 %
HCT: 37.9 % (ref 36.0–46.0)
Hemoglobin: 11.6 g/dL — ABNORMAL LOW (ref 12.0–15.0)
Immature Granulocytes: 0 %
Lymphocytes Relative: 9 %
Lymphs Abs: 0.5 10*3/uL — ABNORMAL LOW (ref 0.7–4.0)
MCH: 27.1 pg (ref 26.0–34.0)
MCHC: 30.6 g/dL (ref 30.0–36.0)
MCV: 88.6 fL (ref 80.0–100.0)
Monocytes Absolute: 0.4 10*3/uL (ref 0.1–1.0)
Monocytes Relative: 8 %
Neutro Abs: 4.6 10*3/uL (ref 1.7–7.7)
Neutrophils Relative %: 83 %
Platelet Count: 188 10*3/uL (ref 150–400)
RBC: 4.28 MIL/uL (ref 3.87–5.11)
RDW: 14.6 % (ref 11.5–15.5)
WBC Count: 5.6 10*3/uL (ref 4.0–10.5)
nRBC: 0 % (ref 0.0–0.2)

## 2023-03-21 LAB — TSH: TSH: 0.559 u[IU]/mL (ref 0.350–4.500)

## 2023-03-21 LAB — OSMOLALITY: Osmolality: 276 mOsm/kg (ref 275–295)

## 2023-03-21 LAB — SODIUM, URINE, RANDOM: Sodium, Ur: 26 mmol/L

## 2023-03-21 LAB — OSMOLALITY, URINE: Osmolality, Ur: 705 mOsm/kg (ref 300–900)

## 2023-03-21 MED ORDER — AZITHROMYCIN 250 MG PO TABS: ORAL_TABLET | ORAL | 0 refills | Status: DC

## 2023-03-21 MED ORDER — GABAPENTIN 100 MG PO CAPS: 100.0000 mg | ORAL_CAPSULE | Freq: Three times a day (TID) | ORAL | 1 refills | Status: DC

## 2023-03-21 MED ORDER — OXYCODONE HCL ER 10 MG PO T12A: 10.0000 mg | EXTENDED_RELEASE_TABLET | Freq: Two times a day (BID) | ORAL | 0 refills | Status: DC

## 2023-03-21 NOTE — Patient Instructions (Signed)
We sent a prescription for an antibiotic to see if it helps with the stuff you are coughing up.  The appointment with Josh next week.  Continue using your oxygen.  Continue using your nebulizer medication particularly when you are more short of breath.  We will see you in follow-up in 6 to 8 weeks time call sooner should any new problems arise.

## 2023-03-21 NOTE — Assessment & Plan Note (Signed)
Encourage patient to utilize home oxygen as needed. Follow-up with pulmonology.

## 2023-03-21 NOTE — Assessment & Plan Note (Addendum)
?   Dehydration vs SIADH Check serum osm, urine osm, urine sodium.  Results are reviewed. - low serum osm 276, high urine osm, urine sodium <40 Probably dehydration.  Recommend encourage oral hydration, will arrange her to get IVF

## 2023-03-21 NOTE — Assessment & Plan Note (Signed)
Follow up with nutritionist.  Recommend nutrition supplementation.  [name] 5 mg twice daily as appetite stimulants.

## 2023-03-21 NOTE — Assessment & Plan Note (Signed)
Continue OxyContin 20mg  twice daily, and oxycodone 5-10mg  Q4h PRN as directed. Refills are sent.

## 2023-03-21 NOTE — Assessment & Plan Note (Signed)
Sinus tachycardia. Likely Multifactorial due to poor oral intake, deconditioning, cancer Encourage oral hydration.  

## 2023-03-21 NOTE — Assessment & Plan Note (Addendum)
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   cT4 N3 M1-  KRASG12C mutation, currently on second line treatment with Lumakras [Patient has high TMB, TPS less than 1%.  Immunotherapy in combination with chemotherapy will be other options in the future if lumakras is not effective] Recommend MRI brain patient declined.  She is not interested in getting CT brain done either CT chest abdomen pelvis showed disease progression. Prognosis is poor. We discussed about rationale and potential side effects third line chemotherapy/immunotherapy options.   liquid biopsy NGS results were reviewed.  Plan to try Immunotherapy with Tecentriq as next line of treatment. May add Taxane in the future.  She declines treatment today.  Will arrange her to have a discussion with palliative care service regarding goals of care.

## 2023-03-21 NOTE — Progress Notes (Signed)
Hematology/Oncology Progress note [phone] [phone]  REASON FOR VISIT Follow up for metastatic lung cancer.   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   cT4 N3 M1-  KRASG12C mutation, currently on second line treatment with Lumakras [Patient has high TMB, TPS less than 1%.  Immunotherapy in combination with chemotherapy will be other options in the future if lumakras is not effective] Recommend MRI brain patient declined.  She is not interested in getting CT brain done either CT chest abdomen pelvis showed disease progression. Prognosis is poor. We discussed about rationale and potential side effects third line chemotherapy/immunotherapy options.   liquid biopsy NGS results were reviewed.  Plan to try Immunotherapy with Tecentriq as next line of treatment. May add Taxane in the future.  She declines treatment today.  Will arrange her to have a discussion with palliative care service regarding goals of care.    Weight loss Follow up with nutritionist.  Recommend nutrition supplementation.  [name] 5 mg twice daily as appetite stimulants.  Tachycardia Sinus tachycardia. Likely Multifactorial due to poor oral intake, deconditioning, cancer Encourage oral hydration.   Neoplasm related pain Continue OxyContin 20mg  twice daily, and oxycodone 5-10mg  Q4h PRN as directed. Refills are sent.     Malignant neoplasm metastatic to bone [facility]) patient declines bisphosphonate. Status post IM nail with Dr. Harlow Mares on 01/01/2023   Chronic respiratory failure with hypoxia [facility]) Encourage patient to utilize home oxygen as needed. Follow-up with  pulmonology.    Hyponatremia ? Dehydration vs SIADH Check serum osm, urine osm, urine sodium.  Results are reviewed. - low serum osm 276, high urine osm, urine sodium <40 Probably dehydration.  Recommend encourage oral hydration, will arrange her to get IVF    Orders Placed This Encounter  Procedures   Osmolality, urine    Standing Status:   Future    Number of Occurrences:   1    Standing Expiration Date:   03/20/2024   Osmolality    Standing Status:   Future    Number of Occurrences:   1    Standing Expiration Date:   03/20/2024   Sodium, urine, random    Standing Status:   Future    Number of Occurrences:   1    Standing Expiration Date:   03/20/2024    Follow up to be determined. Depending on her decision regarding to further treatments.  All questions were answered. The patient knows to call the clinic with any problems, questions or concerns.  [name], MD, PhD [facility] Hematology Oncology 03/21/2023    PERTINENT ONCOLOGY HISTORY 61 y.o. female with past medical history including GERD, COPD, current everyday smoker presents for follow-up of lung mass and pleural effusion. 10/20/2020, patient was brought to ED via EMS due to generalized weakness, dizziness, chest pain shortness of breath.  She was recently diagnosed with COPD approximately 1 month ago by primary care provider.  Also unintentional weight loss during the last few months. Image work-up showed right-sided pleural effusion.  She underwent right thoracentesis.  With removal of 2.4 L of fluid. 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  Metastatic disease to low cervical/thoracic nodes, lung, right pleural space and bones.  Moderate to large right pleural effusion.  SVC narrowing. 10/21/2020 MRI brain is negative for metastasis.  Mild chronic microvascular ischemic changes in the white matter.  Negative for acute  infarct Regarding to the SVC narrowing, patient was seen by vascular surgeon and was recommended no intervention inpatient.  Patient to follow-up outpatient with vascular surgeon for evaluation. Patient also was treated for COPD exacerbation with hypoxia on exertion. Qualifies for home oxygen and hospitalist arrange home health and a nebulizer.  Patient was given a course of prednisone taper and empiric Levaquin. Patient was discharged and present today to follow-up with cytology results and further management plan  10/27/2020 PET scan showed right upper lobe primary bronchogenic mass with direct invasion into mediastinum. Metastatic disease to the right pleural space, lung, bone, nodes of the chest and less so lower leg/upper abdomen. Hypermetabolic him along the course of the right axillary vein with concurrent subtle hyper attenuation within the axillary vein and SVC.  This continues to the level of SVC narrowing.  Suspicious for SVC occlusion and developing thrombus. Moderate right pleural effusion and small pericardial effusion.  # # left supraclavicular mass biopsy- pathology is positive for adenocarcinoma. positive for CK7, with patchy, weak CK20.  They are  negative for TTF1, NapsinA, GATA3, CDX2, and Pax8.  The findings are  nonspecific; but may be compatible with a poorly differentiated  adenocarcinoma of lung origin, especially given the imaging findings  She also had another repeat thoracentesis and fluid cytology showed malignancy.  #NGS showed AXIN 1 [id number], DIS2 M19fs, KRAS G12C, PRDM1 M1 [id number]*, [id number], MS stable, TPS <1%   11/14/2020- 12/12/2020.  #Bronchial obstruction and SVC occlusion.  s/p palliative radiation #After 2 cycles of chemtherapy, patient had CT scan done due to shortness of breath.  01/05/2021, CT chest angiogram PE protocol showed no evidence of pulmonary embolism.  Stable disease.  Right upper lobe mass about 6.7 x 5.3 cm.  Widespread metastatic  disease, lung and bone involvement. Interval development of right-sided hydronephrosis. 03/06/2021 finished T2 palliative radiation 03/22/2021, 6 cycles of carboplatin/Alimta/bevacizumab.  Infusion reaction of carboplatin during cycle 6.  Carboplatin discontinued. 04/07/2021 CT chest abdomen pelvis-partial response Mild decrease in size of right lung mass, mildly decreased mediastinal and hilar adenopathy.  Extensive right-sided pleural metastasis unchanged.  Similar appearance of diffuse bilateral pulmonary nodules.  Multifocal lytic sclerotic bone metastasis.  New superior endplate deformity of T6 04/12/2021 - 09/08/2021, patient was continued on bevacizumab and Alimta.  09/21/2021, bone scan showed multifocal abnormal uptake including mid to distal shaft of right femur, left femoral neck and trochanter, left sacroiliac region, left ischium.  Focal activity at the proximal tibia on the left.  Multi focal heterogeneous bilateral rib activity.  Focal activity at the sternum. 09/27/2021, CT chest abdomen pelvis with contrast showed progression of infrahilar left lower lobe with a concerning clearly progressive masslike consolidation opacity.  Mild progression of small left pleural effusion.  Similar experience of extensive irregular right-sided pleural disease.  Similar experience of bony metastasis.  09/29/2021 extra cycle of maintenance bevacizumab and Alimta while waiting for approval of Lumakras  10/20/2021 started on Lumakras  01/09/2022, CT chest abdomen pelvis with contrast showed no substantial interval changes in exam.  Stable disease. 03/20/2022, CT chest abdomen pelvis with contrast showed unchanged circumferential pleural thickening/pleural nodularity of the right hemithorax, left infrahilar mass and bilateral pulmonary nodules.  Stable right perihilar consolidation likely postradiation changes.  Unchanged numerous sclerotic osseous lesions.  Aortic atherosclerosis.  10/12/22 CT chest abdomen pelvis  w contrast 1. No significant change in post treatment appearance of the chest.Perihilar post radiation fibrosis and consolidation of the right lung, mass of the superior segment left lower lobe, extensive right-greater-than-left pleural thickening and nodularity, pleural effusions, and numerous bilateral pulmonary nodules are unchanged. 2. Unchanged matted post treatment appearance of soft tissue throughout the mediastinum and right hilum. Unchanged enlarged left epicardial lymph nodes without other discretely visualized lymph nodes in the chest. 3. Similar appearance of a lytic osseous metastatic lesion of the base of the right femoral neck, with additional unchanged widespread  sclerotic osseous metastatic disease throughout the included axial and proximal appendicular skeleton. 4. No evidence of new metastatic disease nor soft tissue metastatic disease in the abdomen or pelvis.   12/24/2022 CT chest abdomen pelvis with contrast showed Bilateral peripheral pleural nodules are mildly increased in size.  Bilateral small effusions.  Stable consolidation at the left lung base.  Stable ill-defined tissue in the mediastinum suggesting treated tumor infiltrates. no evidence of metastatic disease in abdomen or pelvis.  Stable sclerotic metastasis in the spine and pelvis.  Stable lytic lesion in the right femoral neck may be at risk of pathological fracture. 01/01/2023 -01/02/2023 s/p  IM nail with Dr. Harlow Mares on 01/01/2023  01/11/2023-01/18/2023 admitted for acute COPD exacerbation, patient received antibiotics, bronchodilators and steroids.  Lumakras was held since that admission. CT angio chest PE protocol showed no acute pulmonary embolus.  Stable appearance of the chest from earlier this month.  Ill-defined soft tissue planes within the mediastinum and central right lung are unchanged from prior.  Stable osseous metastatic disease. 01/17/2023 CT angio chest pulmonary embolism protocol showed no acute PE.  Slight  interval increase in the size of pleural and parenchymal nodularity seen previously.  Stable diffuse sclerotic bone metastasis.  Aortic atherosclerosis.  01/21/2023 - 01/22/2023, ER visits for dyspnea, patient was discharged with home oxygen.   INTERVAL HISTORY Brittney Fisher is a 62 y.o. female who has above history reviewed by me today presents for follow up visit for management of metastatic lung cancer Today patient was accompanied by daughter.   Chronic respiratory failure on nasal cannula oxygen.  - Patient reports no new problems since last week's visit. - Acknowledged weight gain and improved appetite. - Expressed hesitation to start immunotherapy today despite prior agreement, citing a change in feeling and decision not to start treatment immediately.    Review of Systems  Constitutional:  Positive for fatigue. Negative for chills and fever.  HENT:   Negative for hearing loss and voice change.   Eyes:  Negative for eye problems.  Respiratory:  Positive for shortness of breath. Negative for chest tightness and cough.   Cardiovascular:  Negative for chest pain.  Gastrointestinal:  Negative for abdominal distention, abdominal pain and blood in stool.  Endocrine: Negative for hot flashes.  Genitourinary:  Negative for difficulty urinating and frequency.   Musculoskeletal:  Negative for arthralgias.       Right thigh pain  Skin:  Negative for itching and rash.  Neurological:  Negative for extremity weakness.  Hematological:  Negative for adenopathy.  Psychiatric/Behavioral:  Positive for sleep disturbance. Negative for confusion.       Allergies  Allergen Reactions   Carboplatin Shortness Of Breath, Itching and Cough     Past Medical History:  Diagnosis Date   COPD (chronic obstructive pulmonary disease)    GERD (gastroesophageal reflux disease)  Metastatic lung cancer (metastasis from lung to other site) 11/02/2020     Past Surgical History:  Procedure Laterality  Date   COLONOSCOPY WITH ESOPHAGOGASTRODUODENOSCOPY (EGD)     COLONOSCOPY WITH PROPOFOL N/A 11/14/2015   Procedure: COLONOSCOPY WITH PROPOFOL;  Surgeon: Hulen Luster, MD;  Location: [facility] ENDOSCOPY;  Service: Gastroenterology;  Laterality: N/A;   ESOPHAGOGASTRODUODENOSCOPY (EGD) WITH PROPOFOL N/A 11/14/2015   Procedure: ESOPHAGOGASTRODUODENOSCOPY (EGD) WITH PROPOFOL;  Surgeon: Hulen Luster, MD;  Location: [facility] ENDOSCOPY;  Service: Gastroenterology;  Laterality: N/A;   FEMUR IM NAIL Right 01/01/2023   Procedure: TREATMENT, INTERTROCHANTERIC FEMORAL FRACTURE, WITH INTRAMEDULLARY IMPLANT (SURG);  Surgeon: Lovell Sheehan, MD;  Location: [facility] ORS;  Service: Orthopedics;  Laterality: Right;    Social History   Socioeconomic History   Marital status: Widowed    Spouse name: Not on file   Number of children: Not on file   Years of education: Not on file   Highest education level: Not on file  Occupational History   Not on file  Tobacco Use   Smoking status: Former    Packs/day: 1.00    Years: 40.00    Additional pack years: 0.00    Total pack years: 40.00    Types: Cigarettes    Quit date: 09/2021    Years since quitting: 1.5   Smokeless tobacco: Never  Vaping Use   Vaping Use: Never used  Substance and Sexual Activity   Alcohol use: Never   Drug use: Never   Sexual activity: Not Currently  Other Topics Concern   Not on file  Social History Narrative   Not on file   Social Determinants of Health   Financial Resource Strain: Low Risk  (02/19/2023)   Overall Financial Resource Strain (CARDIA)    Difficulty of Paying Living Expenses: Not hard at all  Food Insecurity: No Food Insecurity (01/12/2023)   Hunger Vital Sign    Worried About Running Out of Food in the Last Year: Never true    [place] in the Last Year: Never true  Transportation Needs: No Transportation Needs (01/12/2023)   PRAPARE - Hydrologist (Medical): No    Lack of Transportation  (Non-Medical): No  Physical Activity: Inactive (02/19/2023)   Exercise Vital Sign    Days of Exercise per Week: 0 days    Minutes of Exercise per Session: 0 min  Stress: Stress Concern Present (02/19/2023)   [place]    Feeling of Stress : To some extent  Social Connections: Moderately Integrated (02/19/2023)   Social Connection and Isolation Panel [NHANES]    Frequency of Communication with Friends and Family: More than three times a week    Frequency of Social Gatherings with Friends and Family: More than three times a week    Attends Religious Services: 1 to 4 times per year    Active Member of [employer] or Organizations: Yes    Attends Archivist Meetings: Never    Marital Status: Widowed  Intimate Partner Violence: Not At Risk (01/12/2023)   Humiliation, Afraid, Rape, and Kick questionnaire    Fear of Current or Ex-Partner: No    Emotionally Abused: No    Physically Abused: No    Sexually Abused: No    Family History  Problem Relation Age of Onset   Breast cancer Cousin 47   Diabetes type II Mother    Hypertension Mother    Colon  cancer Father    Hypertension Father      Current Outpatient Medications:    acetaminophen (TYLENOL) 500 MG tablet, Take 1,000 mg by mouth every 8 (eight) hours as needed for moderate pain. , Disp: , Rfl:    albuterol (VENTOLIN HFA) 108 (90 Base) MCG/ACT inhaler, Inhale 2 puffs into the lungs every 6 (six) hours as needed for wheezing or shortness of breath., Disp: 8 g, Rfl: 2   benzonatate (TESSALON) 100 MG capsule, Take 1 capsule by mouth three times daily as needed for cough, Disp: 60 capsule, Rfl: 3   dexamethasone (DECADRON) 2 MG tablet, Take 1 tablet (2 mg total) by mouth daily., Disp: 14 tablet, Rfl: 0   dronabinol ([name]) 5 MG capsule, Take 1 capsule (5 mg total) by mouth 2 (two) times daily before a meal., Disp: 60 capsule, Rfl: 0   fluticasone-salmeterol (ADVAIR  DISKUS) 250-50 MCG/ACT AEPB, Inhale 1 puff into the lungs in the morning and at bedtime., Disp: 60 each, Rfl: 0   ipratropium-albuterol (DUONEB) 0.5-2.5 (3) MG/3ML SOLN, Take 3 mLs by nebulization 3 (three) times daily., Disp: 360 mL, Rfl: 1   LORazepam (ATIVAN) 0.5 MG tablet, Take 1 tablet (0.5 mg total) by mouth every 8 (eight) hours., Disp: 30 tablet, Rfl: 0   naloxone (NARCAN) nasal spray 4 mg/0.1 mL, For suspected overdose of narcotics., Disp: 1 each, Rfl: 0   omeprazole (PRILOSEC) 20 MG capsule, Take 1 capsule (20 mg total) by mouth daily as needed. Take Lumakras 4 hr before or 10 hr after taking omeprazole., Disp: 30 capsule, Rfl: 0   oxyCODONE (OXY IR/ROXICODONE) 5 MG immediate release tablet, Take 1-2 tablets (5-10 mg total) by mouth every 4 (four) hours as needed for moderate pain or severe pain., Disp: 120 tablet, Rfl: 0   protein supplement shake (PREMIER PROTEIN) LIQD, Take 11 oz by mouth daily., Disp: , Rfl:    sennosides-docusate sodium (SENOKOT-S) 8.6-50 MG tablet, Take 1 tablet by mouth daily., Disp: , Rfl:    azithromycin (ZITHROMAX) 250 MG tablet, Take 2 tablets (500 mg) on  Day 1,  followed by 1 tablet (250 mg) once daily on Days 2 through 5., Disp: 6 each, Rfl: 0   gabapentin (NEURONTIN) 100 MG capsule, Take 1 capsule (100 mg total) by mouth 3 (three) times daily., Disp: 90 capsule, Rfl: 1   oxyCODONE (OXYCONTIN) 10 mg 12 hr tablet, Take 1 tablet (10 mg total) by mouth every 12 (twelve) hours., Disp: 60 tablet, Rfl: 0  Physical exam: ECOG 2 Vitals:   03/21/23 1119  BP: 92/76  Pulse: (!) 112  Temp: (!) 96 F (35.6 C)  TempSrc: Tympanic  SpO2: 100%  Weight: 115 lb 1.6 oz (52.2 kg)  PF: (!) 2 L/min   Physical Exam Constitutional:      General: She is not in acute distress.    Appearance: She is ill-appearing.  HENT:     Head: Normocephalic and atraumatic.  Eyes:     General: No scleral icterus. Cardiovascular:     Rate and Rhythm: Regular rhythm. Tachycardia  present.     Heart sounds: Normal heart sounds.  Pulmonary:     Effort: Pulmonary effort is normal. No respiratory distress.     Breath sounds: No wheezing.     Comments: Slightly decreased breath sound bilaterally.  Abdominal:     General: Bowel sounds are normal. There is no distension.     Palpations: Abdomen is soft.  Musculoskeletal:  General: No deformity. Normal range of motion.     Cervical back: Normal range of motion and neck supple.  Skin:    General: Skin is warm and dry.     Findings: No erythema or rash.  Neurological:     Mental Status: She is alert and oriented to person, place, and time. Mental status is at baseline.     Cranial Nerves: No cranial nerve deficit.     Coordination: Coordination normal.  Psychiatric:        Mood and Affect: Mood normal.    LABORATORY STUDIES     Latest Ref Rng & Units 03/21/2023   10:38 AM 03/14/2023    2:16 PM 03/07/2023    1:48 PM  CBC  WBC 4.0 - 10.5 K/uL 5.6  5.9  5.0   Hemoglobin 12.0 - 15.0 g/dL 11.6  11.2  11.7   Hematocrit 36.0 - 46.0 % 37.9  35.8  38.0   Platelets 150 - 400 K/uL 188  198  220       Latest Ref Rng & Units 03/21/2023   10:38 AM 03/14/2023    2:16 PM 03/07/2023    1:48 PM  CMP  Glucose 70 - 99 mg/dL 99  150  142   BUN 8 - 23 mg/dL 13  13  16    Creatinine 0.44 - 1.00 mg/dL 0.36  0.45  0.48   Sodium 135 - 145 mmol/L 129  135  135   Potassium 3.5 - 5.1 mmol/L 3.7  3.5  3.7   Chloride 98 - 111 mmol/L 90  97  98   CO2 22 - 32 mmol/L 29  29  30    Calcium 8.9 - 10.3 mg/dL 8.4  8.1  8.7   Total Protein 6.5 - 8.1 g/dL 6.7  6.7  7.2   Total Bilirubin 0.3 - 1.2 mg/dL 0.4  0.4  0.4   Alkaline Phos 38 - 126 U/L 118  131  165   AST 15 - 41 U/L 22  21  18    ALT 0 - 44 U/L 13  14  14       RADIOGRAPHIC STUDIES: I have personally reviewed the radiological images as listed and agreed with the findings in the report. CT CHEST ABDOMEN PELVIS W CONTRAST  Result Date: 03/08/2023 CLINICAL DATA:  Lung cancer  restaging * Tracking Code: BO * EXAM: CT CHEST, ABDOMEN, AND PELVIS WITH CONTRAST TECHNIQUE: Multidetector CT imaging of the chest, abdomen and pelvis was performed following the standard protocol during bolus administration of intravenous contrast. RADIATION DOSE REDUCTION: This exam was performed according to the departmental dose-optimization program which includes automated exposure control, adjustment of the mA and/or kV according to patient size and/or use of iterative reconstruction technique. CONTRAST:  73mL OMNIPAQUE IOHEXOL 300 MG/ML  SOLN COMPARISON:  Multiple exams, including 01/17/2023 and 01/11/2023 FINDINGS: CT CHEST FINDINGS Cardiovascular: Aortic and branch vessel atherosclerotic vascular calcification. Mediastinum/Nodes: Stranding of mediastinal adipose tissue causing ill definition of of tumor margins in the mediastinum, with suspected tumor or confluent adenopathy in the right paratracheal, subcarinal, right hilar, and right infrahilar regions although with individual nodes not readily seen due to the ill-defined and infiltrative nature. Appearance generally similar to prior. Lungs/Pleura: Extensive consolidation and enhancement at the right lung apex, in the superior segment right lower lobe posterior to the hilum, and tracking in the right lower lobe, with extensive nodularity peripherally in the right lower lobe and right middle lobe compatible with tumor deposits. Some of  the consolidation in the right paramediastinal region may be therapy related. In individual subpleural nodule in the right lower lobe measures 1.4 by 2.0 cm on image 83 series 4, previously 1.3 by 1.7 cm. An index right middle lobe subpleural nodule measures 1.4 by 1.0 cm on image 101 of series 2, previously 1.3 by 0.9 cm. A lingular nodule on image 100 series 4 measures 1.4 by 1.1 cm, previously 1.3 by 1.1 cm. Other left upper lobe nodules appear stable. Volume loss and consolidation of most of the left lower lobe with  enhancing pulmonary parenchymal tissue. There is likely some tumor within this consolidated lung. Extensive pleural rind at the right lung base with some probable anterior chest wall invasion for example in the fifth and sixth intercostal spaces. Musculoskeletal: Sclerosis and periosteal reaction in the right scapula favoring metastatic lesion. There are a few sclerotic rib lesions such as in the left lateral third rib compatible with metastatic disease. Sclerotic lesion in the manubrium is stable and compatible with metastatic disease. Scattered thoracic spine vertebral metastatic lesions appear stable. CT ABDOMEN PELVIS FINDINGS Hepatobiliary: Unremarkable Pancreas: Unremarkable Spleen: Unremarkable Adrenals/Urinary Tract: Adrenal glands appear normal. Single bilateral simple renal cysts are benign and warrant no individual follow up. Stomach/Bowel: Unremarkable Vascular/Lymphatic: Atherosclerosis is present, including aortoiliac atherosclerotic disease. Reproductive: Small calcified fundal fibroid the uterus. Other: No supplemental non-categorized findings. Musculoskeletal: Scattered sclerotic metastatic lesions in the lumbar spine, bony pelvis, and left proximal femur. Right hip screw. No significant change or progression of the osseous metastatic disease. Grade 1 degenerative anterolisthesis at L5-S1. IMPRESSION: 1. Individual lung nodules are mildly increased in size compared to 01/17/2023 compatible with mild progression. Extensive tumor rind at the right lung base with perihilar and peribronchovascular airspace opacities in the right lung which may be from tumor or radiation therapy. Probable mild chest wall invasion in the right anterior fifth and sixth intercostal spaces. 2. Stable indistinct infiltration in the right mediastinum probably from ill-defined adenopathy. 3. Stable volume loss and likely tumor in the left lower lobe. Substantial left pleural enhancement probably from left pleural malignancy. 4.  Scattered sclerotic osseous metastatic lesions in the axial and appendicular skeleton, stable. Aortic Atherosclerosis (ICD10-I70.0). Electronically Signed   By: Van Clines M.D.   On: 03/08/2023 14:10   DG Chest Portable 1 View  Result Date: 03/02/2023 CLINICAL DATA:  Shortness of breath EXAM: PORTABLE CHEST 1 VIEW COMPARISON:  01/21/2023, CT 01/17/2023, chest x-ray 12/21/2022, 10/31/2020 FINDINGS: Similar appearance of right greater than left pleural effusions. Distortion in the right apex and perihilar region as before. Bilateral pulmonary nodules corresponding to history of metastatic disease. Scattered foci of sclerosis within the scapula corresponding to history of skeletal metastatic disease. IMPRESSION: Overall stable chest radiograph compared with 01/21/2023. Bilateral effusions, pulmonary metastatic disease and right perihilar and apical distortion do not show significant interval change. Electronically Signed   By: Donavan Foil M.D.   On: 03/02/2023 22:46   US Venous Img Lower Bilateral (DVT)  Result Date: 01/21/2023 CLINICAL DATA:  Shortness of breath. EXAM: BILATERAL LOWER EXTREMITY VENOUS DOPPLER ULTRASOUND TECHNIQUE: Gray-scale sonography with compression, as well as color and duplex ultrasound, were performed to evaluate the deep venous system(s) from the level of the common femoral vein through the popliteal and proximal calf veins. COMPARISON:  None Available. FINDINGS: VENOUS Normal compressibility of the common femoral, superficial femoral, and popliteal veins, as well as the visualized calf veins. Visualized portions of profunda femoral vein and great saphenous vein unremarkable. No filling  defects to suggest DVT on grayscale or color Doppler imaging. Doppler waveforms show normal direction of venous flow, normal respiratory plasticity and response to augmentation. OTHER None. Limitations: none IMPRESSION: No sonographic evidence of bilateral lower extremity DVT. Electronically  Signed   By: Keith Rake M.D.   On: 01/21/2023 20:55   DG Chest 2 View  Result Date: 01/21/2023 CLINICAL DATA:  Shortness of breath with movement and walking, stage IV lung cancer EXAM: CHEST - 2 VIEW COMPARISON:  Earlier study of 01/21/2023 at 1033 hours FINDINGS: Normal heart size, mediastinal contours, and pulmonary vascularity. Changes of COPD with chronic opacity at RIGHT apex and perihilar region likely related to prior therapy. Persistent small bibasilar pleural effusions greater on RIGHT. Nodular foci in both lungs consistent with known metastatic disease. No pneumothorax. Bones demineralized with chronic height loss of an upper thoracic vertebra unchanged. Scattered sclerotic foci consistent with osseous metastases in spine, RIGHT scapula, proximal RIGHT humerus. IMPRESSION: Post therapy changes in the RIGHT lung. Pulmonary metastatic disease with small bibasilar effusions greater on RIGHT. Osseous metastases. Emphysema (ICD10-J43.9). Electronically Signed   By: Lavonia Dana M.D.   On: 01/21/2023 17:16   DG Chest Portable 1 View  Result Date: 01/21/2023 CLINICAL DATA:  Shortness of breath on exertion. Stage IV lung cancer. EXAM: PORTABLE CHEST 1 VIEW COMPARISON:  01/15/2023 and CT chest 01/17/2023. FINDINGS: Trachea is midline. Heart size normal. Extensive post radiation in the upper right hemithorax and right perihilar region. Bilateral pleuroparenchymal nodularity, as on comparison CT. Small bilateral pleural effusions. Findings are unchanged from 01/15/2023. IMPRESSION: 1. Pulmonary metastatic disease. 2. Small bilateral pleural effusions. 3. Post radiation changes in the upper right hemithorax and right perihilar region. 4. No superimposed acute abnormality. Electronically Signed   By: Lorin Picket M.D.   On: 01/21/2023 10:46   CT Angio Chest Pulmonary Embolism (PE) W or WO Contrast  Result Date: 01/17/2023 CLINICAL DATA:  COPD, history of metastatic lung cancer, worsening dyspnea and  cough EXAM: CT ANGIOGRAPHY CHEST WITH CONTRAST TECHNIQUE: Multidetector CT imaging of the chest was performed using the standard protocol during bolus administration of intravenous contrast. Multiplanar CT image reconstructions and MIPs were obtained to evaluate the vascular anatomy. RADIATION DOSE REDUCTION: This exam was performed according to the departmental dose-optimization program which includes automated exposure control, adjustment of the mA and/or kV according to patient size and/or use of iterative reconstruction technique. CONTRAST:  53mL OMNIPAQUE IOHEXOL 350 MG/ML SOLN COMPARISON:  01/11/2023 FINDINGS: Cardiovascular: This is a technically adequate evaluation of the pulmonary vasculature. No filling defects or pulmonary emboli. The heart is unremarkable without pericardial effusion. No evidence of thoracic aortic aneurysm or dissection. Stable atherosclerosis of the aorta and coronary vasculature. 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 Mediastinum/Nodes: Loss of normal fat planes throughout the mediastinum may reflect post therapeutic change or sequela of metastatic disease. Discrete adenopathy or mass is difficult to measure, but the appearance is stable since the previous evaluation. Thyroid, trachea, and esophagus are grossly unremarkable. Lungs/Pleura: Chronic areas of consolidation within the bilateral lower lobes are again noted and unchanged. Extensive pleural nodularity again noted bilaterally, right greater than left. Chronic scarring and bronchovascular thickening within the right hilar region unchanged. Index nodules measured previously are as follows: Left upper lobe, image 50/6, 8 mm.  Previously 7 mm. Right lower lobe, image 103/6, 16 mm.  Previously 14 mm. Right middle lobe, image 103/6,  13 mm.  Previously 12 mm. Left upper lobe, image 99/6, 13 mm.  Previously  12 mm. Trace bilateral pleural effusions are again noted.  No pneumothorax. Upper Abdomen: No acute abnormality. Musculoskeletal: Stable sclerotic metastatic disease within the bilateral scapula, sternum, and thoracic spine. There are no acute displaced fractures. Reconstructed images demonstrate no additional findings. Review of the MIP images confirms the above findings. IMPRESSION: 1. No evidence of pulmonary embolus. 2. Findings consistent with known metastatic lung cancer, with slight interval increase in size of the pleural and parenchymal nodularity seen previously. 3. Stable diffuse sclerotic bony metastases. 4.  Aortic Atherosclerosis (ICD10-I70.0). Electronically Signed   By: Randa Ngo M.D.   On: 01/17/2023 16:37   ECHOCARDIOGRAM COMPLETE  Result Date: 01/16/2023    ECHOCARDIOGRAM REPORT   Patient Name:   AFENI GAVIDIA Date of Exam: 01/16/2023 Medical Rec #:  [id number]        Height:       71.0 in Accession #:    [id number]       Weight:       133.8 lb Date of Birth:  1961-12-16        BSA:          1.778 m Patient Age:    13 years         BP:           129/71 mmHg Patient Gender: F                HR:           110 bpm. Exam Location:  [facility] Procedure: 2D Echo, Color Doppler and Cardiac Doppler Indications:    Acute respiratory distress R06.03  History:        Patient has no prior history of Echocardiogram examinations.                 COPD.  Sonographer:    Sherrie Sport Referring Phys: [id number] [facility] Diagnosing      Kate Sable MD Phys:  Sonographer Comments: Image acquisition challenging due to patient body habitus. IMPRESSIONS  1. Left ventricular ejection fraction, by estimation, is 60 to 65%. The left ventricle has normal function. The left ventricle has no regional wall motion abnormalities. Left ventricular diastolic parameters are consistent with Grade I diastolic dysfunction (impaired relaxation).  2. Right ventricular systolic function is normal. The right ventricular size  is normal.  3. The mitral valve is normal in structure. No evidence of mitral valve regurgitation.  4. The aortic valve is tricuspid. Aortic valve regurgitation is not visualized. FINDINGS  Left Ventricle: Left ventricular ejection fraction, by estimation, is 60 to 65%. The left ventricle has normal function. The left ventricle has no regional wall motion abnormalities. The left ventricular internal cavity size was normal in size. There is  no left ventricular hypertrophy. Left ventricular diastolic parameters are consistent with Grade I diastolic dysfunction (impaired relaxation). Right Ventricle: The right ventricular size is normal. No increase in right ventricular wall thickness. Right ventricular systolic function is normal. Left Atrium: Left atrial size was normal in size. Right Atrium: Right atrial size was normal in size. Pericardium: There is no evidence of pericardial effusion. Mitral Valve: The mitral valve is normal in structure. No evidence of mitral valve regurgitation. Tricuspid Valve: The tricuspid valve is normal in structure. Tricuspid valve regurgitation is not demonstrated. Aortic Valve: The aortic valve is tricuspid. Aortic valve regurgitation is not visualized. Aortic valve mean gradient measures 3.0 mmHg.  Aortic valve peak gradient measures 4.8 mmHg. Aortic valve area, by VTI measures 2.30 cm. Pulmonic Valve: The pulmonic valve was normal in structure. Pulmonic valve regurgitation is not visualized. Aorta: The aortic root is normal in size and structure. Venous: The inferior vena cava was not well visualized. IAS/Shunts: No atrial level shunt detected by color flow Doppler.  LEFT VENTRICLE PLAX 2D LVIDd:         3.20 cm   Diastology LVIDs:         1.90 cm   LV e' medial:    7.07 cm/s LV PW:         0.90 cm   LV E/e' medial:  9.1 LV IVS:        1.10 cm   LV e' lateral:   9.25 cm/s LVOT diam:     1.90 cm   LV E/e' lateral: 7.0 LV SV:         37 LV SV Index:   21 LVOT Area:     2.84 cm  RIGHT  VENTRICLE RV Basal diam:  3.60 cm RV Mid diam:    2.70 cm RV S prime:     18.40 cm/s TAPSE (M-mode): 2.1 cm LEFT ATRIUM           Index       RIGHT ATRIUM          Index LA diam:      1.50 cm 0.84 cm/m  RA Area:     9.50 cm LA Vol (A2C): 6.9 ml  3.88 ml/m  RA Volume:   17.30 ml 9.73 ml/m LA Vol (A4C): 10.0 ml 5.63 ml/m  AORTIC VALVE AV Area (Vmax):    2.35 cm AV Area (Vmean):   2.05 cm AV Area (VTI):     2.30 cm AV Vmax:           109.00 cm/s AV Vmean:          76.900 cm/s AV VTI:            0.160 m AV Peak Grad:      4.8 mmHg AV Mean Grad:      3.0 mmHg LVOT Vmax:         90.20 cm/s LVOT Vmean:        55.600 cm/s LVOT VTI:          0.130 m LVOT/AV VTI ratio: 0.81  AORTA Ao Root diam: 3.20 cm MITRAL VALVE               TRICUSPID VALVE MV Area (PHT): 6.90 cm    TR Peak grad:   31.6 mmHg MV Decel Time: 110 msec    TR Vmax:        281.00 cm/s MV E velocity: 64.50 cm/s MV A velocity: 96.90 cm/s  SHUNTS MV E/A ratio:  0.67        Systemic VTI:  0.13 m                            Systemic Diam: 1.90 cm Kate Sable MD Electronically signed by Kate Sable MD Signature Date/Time: 01/16/2023/4:29:24 PM    Final    DG Chest Port 1 View  Result Date: 01/15/2023 CLINICAL DATA:  Shortness of breath.  COPD.  Metastatic lung cancer. EXAM: PORTABLE CHEST 1 VIEW COMPARISON:  01/11/2023 FINDINGS: Heart size remains normal. Chronic volume loss and scarring in the right upper chest remains stable. Chronic bilateral pleural  blunting remains stable. Chronic volume loss/infiltrate in both lower lobes. Compared to the study of 4 days ago, findings appear quite similar. No new process is identified. IMPRESSION: No significant change since the study of 4 days ago. Chronic volume loss and scarring in the right upper chest. 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 Electronically Signed   By: Nelson Chimes M.D.   On: 01/15/2023 08:07   US Abdomen Limited RUQ (LIVER/GB)  Result  Date: 01/12/2023 CLINICAL DATA:  62 year old female with history of abdominal pain. EXAM: ULTRASOUND ABDOMEN LIMITED RIGHT UPPER QUADRANT COMPARISON:  No priors. FINDINGS: Gallbladder: Gallbladder is moderately distended. No evidence of gallstones or biliary sludge. Gallbladder wall thickness is normal. No pericholecystic fluid. Per report from the sonographer, there was no sonographic Murphy's sign on examination. Common bile duct: Diameter: 3.4 mm. Liver: No focal lesion identified. Within normal limits in parenchymal echogenicity. Portal vein is patent on color Doppler imaging with normal direction of blood flow towards the liver. Other: Incidental imaging of the right kidney demonstrates an anechoic lesion with increased through transmission in the interpolar region, compatible with a large parapelvic cyst, estimated to measure approximately 4.3 x 4.0 x 2.8 cm (no imaging follow-up recommended). IMPRESSION: 1. No acute findings. Specifically, no gallstones and no evidence of acute cholecystitis. Electronically Signed   By: Vinnie Langton M.D.   On: 01/12/2023 05:42   CT Angio Chest PE W and/or Wo Contrast  Result Date: 01/11/2023 CLINICAL DATA:  Pulmonary embolism (PE) suspected, high prob Patient with history of metastatic lung cancer. EXAM: CT ANGIOGRAPHY CHEST WITH CONTRAST TECHNIQUE: Multidetector CT imaging of the chest was performed using the standard protocol during bolus administration of intravenous contrast. Multiplanar CT image reconstructions and MIPs were obtained to evaluate the vascular anatomy. RADIATION DOSE REDUCTION: This exam was performed according to the departmental dose-optimization program which includes automated exposure control, adjustment of the mA and/or kV according to patient size and/or use of iterative reconstruction technique. CONTRAST:  80mL OMNIPAQUE IOHEXOL 350 MG/ML SOLN COMPARISON:  Radiograph earlier today. Staging chest CT 12/24/2022 FINDINGS: Cardiovascular: There  are no filling defects within the pulmonary arteries to suggest pulmonary embolus. Aortic atherosclerosis and tortuosity. No acute aortic findings. Stable heart size. Trace pericardial effusion. Bilateral anterior chest wall collaterals, however no evidence of SVC occlusion. Mediastinum/Nodes: Ill-defined soft tissue planes within the mediastinum again seen without significant interval change. Ill-defined soft tissue density seen anterior to the trachea, at the level of the carina and adjacent to the right mainstem bronchus. 14 mm central left hilar lymph node versus pulmonary nodule. Patulous esophagus. Lungs/Pleura: Again seen pleural thickening the right lung apex, near circumferential throughout the right hemithorax with areas of pleural nodularity. Areas of peripheral pleural based nodularity in the right hemithorax are again seen. Representative right lower lobe pleural base nodule measures 15 x 12 mm series 7, image 97, unchanged by my retrospective measurement. Peri diaphragmatic nodule in the right lung base measures 14 mm series 7, image 111, also unchanged. Pleural based right middle lobe nodule measures 11 mm series 7, image 109, unchanged. Interstitial nodularity throughout the right lower lobe with bronchial thickening is stable. Ill-defined soft tissue density in the central right perihilar lung is unchanged. Small right pleural effusion. Consolidation in the left lower lobe, most prominent centrally, with heterogeneous parenchyma, stable from prior. Subpleural left upper lobe nodule measures 12 mm series 7, image 102, previously 10 mm. Subpleural left upper lobe nodule measuring 7 mm  series 7, image 58, previously 6 mm. Similar left pleural thickening with small left pleural effusion. No definite acute airspace disease. Upper Abdomen: Assessed on abdominopelvic CT earlier today. Musculoskeletal: Multifocal sclerotic osseous lesions typical of metastatic disease. Exaggerated upper thoracic kyphosis.  Unchanged upper thoracic mild compression deformities. No new osseous findings. Review of the MIP images confirms the above findings. IMPRESSION: 1. No pulmonary embolus. 2. History of known lung cancer, with stable appearance of the chest from earlier this month. Recommend continued oncologic follow-up per protocol. 3. Ill-defined soft tissue planes within the mediastinum and central right lung are unchanged from prior exam. 4. Stable osseous metastatic disease. Aortic Atherosclerosis (ICD10-I70.0). Electronically Signed   By: Keith Rake M.D.   On: 01/11/2023 21:21   CT ABDOMEN PELVIS WO CONTRAST  Result Date: 01/11/2023 CLINICAL DATA:  Left lower quadrant abdominal pain. 2 days. History of non-small-cell lung cancer. EXAM: CT ABDOMEN AND PELVIS WITHOUT CONTRAST TECHNIQUE: Multidetector CT imaging of the abdomen and pelvis was performed following the standard protocol without IV contrast. RADIATION DOSE REDUCTION: This exam was performed according to the departmental dose-optimization program which includes automated exposure control, adjustment of the mA and/or kV according to patient size and/or use of iterative reconstruction technique. COMPARISON:  CT 12/24/2022. older CT sidewall. X-ray 01/11/2023 earlier FINDINGS: Lower chest: Once again at the lung bases are pleural effusions, pleural thickening and masslike areas with reticulonodular changes, similar to the prior examination. Please correlate with prior chest CT and clinical history of lung cancer. Pericardial effusion is also seen with some enlarged pre cardiophrenic lymph nodes Hepatobiliary: On this non IV contrast exam, the liver is grossly preserved. The gallbladder is distended but unchanged from prior. Pancreas: Grossly the pancreas has preserved parenchyma. Spleen: Spleen is nonenlarged. Adrenals/Urinary Tract: Stable mild thickening of the adrenal glands. Bilateral Bosniak 1 renal cysts are stable. No specific imaging follow-up of the  cysts in the kidneys. No renal or ureteral stones clearly seen. Contracted urinary bladder. Stomach/Bowel: Stomach is nondilated. There is some luminal air and fluid. There is some high attenuation seen along the course of the colon with scattered stool. The colon is nondilated. The cecum is in the low right hemipelvis. Very redundant course to loops of colon. Normal appendix identified posterior cecum in the right hemipelvis as seen on coronal series 4, image 39. No clear adjacent inflammatory stranding along the colon. Small bowel is nondilated. Vascular/Lymphatic: Diffuse vascular calcifications along the aorta and branch vessels. Grossly preserved caliber IVC. Reproductive: Uterus and bilateral adnexa are unremarkable. There are some calcifications along the uterus, possible calcified fibroids. Other: Anasarca.  No clear ascites. Musculoskeletal: Scattered sclerotic bone metastases are identified. Please correlate with prior workup. Interval placement of dynamic right hip screw with acute surgical changes. Lytic lesion along the right femoral neck is again seen. Stranding. IMPRESSION: Study limited without the advantage of contrast and lack of intra-abdominal fat. No bowel obstruction, free air or free fluid. Diffuse scattered colonic stool. Normal appendix. Anasarca. Interval placement of a dynamic right hip screw transfixing the lucent lesion in the femoral neck. There is sclerotic bone metastases elsewhere as per prior history. Distended gallbladder but unchanged from previous. If there is concern of acute gallbladder pathology ultrasound may be useful. Extensive abnormality along the lung bases consistent with known history of lung cancer. Please correlate with recent workup. Electronically Signed   By: Jill Side M.D.   On: 01/11/2023 19:32   DG Chest Portable 1 View  Result Date: 01/11/2023  CLINICAL DATA:  Shortness of breath. Cancer. Evaluate for effusion or edema. EXAM: PORTABLE CHEST 1 VIEW  COMPARISON:  Radiograph 12/21/2022. CT 12/24/2022 FINDINGS: Chronic volume loss in the right hemithorax. Chronic right apical opacity, ill-defined right basilar opacities and right basilar effusion. Left infrahilar opacity and small left effusion are similar to prior CT. No pulmonary edema. Stable heart size and mediastinal contours. No pneumothorax. Scattered sclerotic lesions are better appreciated on prior CT. IMPRESSION: 1. Stable radiographic appearance of the chest. Stable right lung volume loss with apical and basilar opacity and right pleural effusion from earlier this month. 2. Stable left basilar opacity and small left pleural effusion. 3. No pulmonary edema. Electronically Signed   By: Keith Rake M.D.   On: 01/11/2023 19:19   DG Abdomen 1 View  Result Date: 01/11/2023 CLINICAL DATA:  Constipation. Patient reports abdominal pain. EXAM: ABDOMEN - 1 VIEW COMPARISON:  CT 12/24/2022 FINDINGS: Added views of the abdomen obtained. No bowel dilatation to suggest obstruction. Moderate volume of stool in the colon, mild gaseous distension of splenic flexure. No radiopaque calculi or abnormal soft tissue calcifications. Surgical hardware in the right proximal femur is partially visualized. Stable sclerotic densities within the left proximal femur. Sclerotic lesions within the bony pelvis are only faintly visualized by radiograph. Known sclerotic metastatic disease. IMPRESSION: Nonobstructive bowel gas pattern. Moderate volume of colonic stool. Electronically Signed   By: Keith Rake M.D.   On: 01/11/2023 18:10   DG FEMUR, MIN 2 VIEWS RIGHT  Result Date: 01/01/2023 CLINICAL DATA:  Right intramedullary nail EXAM: RIGHT FEMUR 2 VIEWS COMPARISON:  None Available. FINDINGS: Fluoroscopic images were obtained intraoperatively and submitted for post operative interpretation. Right intramedullary nail placement, 10 images were obtained with 1 minute and 27 seconds of fluoroscopy time and 8.23 mGy. Please  see the performing provider's procedural report for further detail. IMPRESSION: Intraoperative fluoroscopic guidance for right intramedullary nail placement. Electronically Signed   By: Yetta Glassman M.D.   On: 01/01/2023 14:57   DG C-Arm 1-60 Min-No Report  Result Date: 01/01/2023 Fluoroscopy was utilized by the requesting physician.  No radiographic interpretation.   DG C-Arm 1-60 Min-No Report  Result Date: 01/01/2023 Fluoroscopy was utilized by the requesting physician.  No radiographic interpretation.   CT CHEST ABDOMEN PELVIS W CONTRAST  Result Date: 12/24/2022 CLINICAL DATA:  Non-small cell lung cancer. Assess treatment response. Initial diagnosis November 2021. Chemotherapy radiation therapy. Immunotherapy completed. * Tracking Code: BO * EXAM: CT CHEST, ABDOMEN, AND PELVIS WITH CONTRAST TECHNIQUE: Multidetector CT imaging of the chest, abdomen and pelvis was performed following the standard protocol during bolus administration of intravenous contrast. RADIATION DOSE REDUCTION: This exam was performed according to the departmental dose-optimization program which includes automated exposure control, adjustment of the mA and/or kV according to patient size and/or use of iterative reconstruction technique. CONTRAST:  84mL OMNIPAQUE IOHEXOL 300 MG/ML  SOLN COMPARISON:  03/20/2022 FINDINGS: CT CHEST FINDINGS Cardiovascular: No significant vascular findings. Normal heart size. No pericardial effusion. Mediastinum/Nodes: Ill-defined tissue planes within the mediastinum consistent with tumor infiltrate. No change from comparison exam. No axillary supraclavicular adenopathy Lungs/Pleura: Peripheral nodules in the RIGHT lower lobe and along the RIGHT lower lobe pleural surface. For example lateral nodule in the RIGHT lower lobe along the pleural surface measures 16 mm (image 86/3 compared to 12 mm. Nodule over the RIGHT hemidiaphragm measuring 13 mm (image 104/3) compares to 11 mm. Nodule in the RIGHT  middle lobe pleural surface measures 11 mm (97/3 compared to  11 mm. There is bronchiectasis and peribronchial thickening in the RIGHT lobe similar prior. Several new peripheral nodules in the LEFT lung. For example LEFT upper lobe nodule measuring 10 mm (image 91/3) compares to 6 mm. Consolidation in the LEFT lower lobe is similar prior. RIGHT upper lobe nodule measuring 6 mm compares to 5 (image 46/3 Musculoskeletal: Stable sclerotic lesions in the thoracic spine CT ABDOMEN AND PELVIS FINDINGS Hepatobiliary: No focal hepatic lesion. No biliary ductal dilatation. Gallbladder is normal. Common bile duct is normal. Pancreas: Pancreas is normal. No ductal dilatation. No pancreatic inflammation. Spleen: Normal spleen Adrenals/urinary tract: Adrenal glands and kidneys are normal. The ureters and bladder normal. Stomach/Bowel: Stomach, small bowel, appendix, and cecum are normal. The colon and rectosigmoid colon are normal. Vascular/Lymphatic: Abdominal aorta is normal caliber with atherosclerotic calcification. There is no retroperitoneal or periportal lymphadenopathy. No pelvic lymphadenopathy. Reproductive: Uterus and adnexa unremarkable. Other: No peritoneal metastasis Musculoskeletal: Multiple round sclerotic lesions in the proximal femurs, iliac bones, sacrum and lower thoracic spine are unchanged. Lytic lesion within the RIGHT femoral neck measures 2.7 cm unchanged IMPRESSION: CHEST IMPRESSION: 1. Bilateral peripheral pleural nodules are mildly increased in size. 2. Bilateral small effusions. Stable consolidation at the LEFT lung base 3. Stable ill-defined tissue in the mediastinum suggesting treated tumor infiltrate. PELVIS IMPRESSION: 1. No evidence of metastatic disease in the abdomen pelvis. 2. Stable sclerotic metastasis in the spine and pelvis. 3. Stable lytic lesion in the RIGHT femoral neck may be at risk for pathologic fracture. 4.  Aortic Atherosclerosis (ICD10-I70.0). Electronically Signed   By: Suzy Bouchard M.D.   On: 12/24/2022 14:06

## 2023-03-21 NOTE — Progress Notes (Signed)
Subjective:    Patient ID: Brittney Fisher, female    DOB: 1961/08/23, 62 y.o.   MRN: [id number] Patient Care Team: Pcp, No as PCP - General Glory Buff, RN as Oncology Nurse Navigator Rickard Patience, MD as Medical Oncologist (Oncology)  Chief Complaint  Patient presents with   Follow-up    SOB and wheezing in the last 2-3 days. No cough.   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  He has extensive bony metastasis.  Recent CT chest 17 January 2023 showed extensive disease on the right, some new nodularity on the left, no pulmonary embolism.  The patient notes dyspnea when walking, talking or when she gets hot.  She notes that oxygen makes it better.  She has been diagnosed with COPD however there are no pulmonary function tests noted.  Currently she is too debilitated to be able to participate on PFTs.  She is on adequate COPD management by her medication list which includes Advair, DuoNeb and as needed albuterol.  She claims compliance with all these medications.  He has not had any recent fevers, chills or sweats.  She has had some increased cough with yellowish sputum production.  No wheezing noted.  She has been consistent using her oxygen.  She notes decreased work of breathing when using the oxygen.   She has not had any chest pain.  She has had weight loss and anorexia.  She is trying to increase her protein intake.  She is cachectic.  Daughter who is present with her today it is that her mother is under palliative care and that they will have a meeting with Elouise Munroe, NP next week to discuss further goals of therapy.   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  Patient has  failed second line therapy and now third line is being contemplated.  The overall picture is that of an unfavorable prognosis.  Review of Systems A 10 point review of systems was performed and it is as noted above otherwise negative.  Patient Active Problem List   Diagnosis Date Noted   Chronic respiratory failure with hypoxia 01/28/2023   Palliative care encounter 01/16/2023   Pathological fracture of right femur 01/16/2023   Hypokalemia 01/15/2023   Hypomagnesemia 01/13/2023   Anxiety 01/12/2023   Peripheral neuropathy 01/12/2023   Generalized abdominal pain 01/12/2023   Hyponatremia 01/12/2023   Abnormal urinalysis 01/12/2023   COPD exacerbation 01/11/2023   Impending pathologic fracture 01/01/2023   Insomnia 07/19/2022   Dysuria 06/22/2022   Malignant neoplasm metastatic to bone 07/28/2021   Anemia 01/09/2021   Odynophagia 01/09/2021   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   Encounter for antineoplastic chemotherapy 11/25/2020   Tachycardia 11/25/2020   Neoplasm related pain 11/25/2020   Primary malignant neoplasm of lung metastatic to other site 11/04/2020   Metastatic lung cancer (metastasis from lung to other site) 11/02/2020   Weight loss 10/28/2020   Goals of care, counseling/discussion 10/28/2020   Malnutrition of moderate degree 10/22/2020   Pleural effusion 10/20/2020   Malignant pleural effusion    COPD with acute exacerbation    Dizziness    Metastatic malignant neoplasm    Shortness of breath    S/P thoracentesis    Lung  mass    Social History   Tobacco Use   Smoking status: Former    Packs/day: 1.00    Years: 40.00    Additional pack years: 0.00    Total pack years: 40.00    Types: Cigarettes    Quit date: 09/2021    Years since quitting: 1.5   Smokeless tobacco: Never  Substance Use Topics   Alcohol use: Never   Allergies  Allergen Reactions   Carboplatin Shortness Of Breath, Itching and Cough   Current Meds  Medication Sig   acetaminophen (TYLENOL)  500 MG tablet Take 1,000 mg by mouth every 8 (eight) hours as needed for moderate pain.    albuterol (VENTOLIN HFA) 108 (90 Base) MCG/ACT inhaler Inhale 2 puffs into the lungs every 6 (six) hours as needed for wheezing or shortness of breath.   benzonatate (TESSALON) 100 MG capsule Take 1 capsule by mouth three times daily as needed for cough   dexamethasone (DECADRON) 2 MG tablet Take 1 tablet (2 mg total) by mouth daily.   dronabinol ([name]) 5 MG capsule Take 1 capsule (5 mg total) by mouth 2 (two) times daily before a meal.   fluticasone-salmeterol (ADVAIR DISKUS) 250-50 MCG/ACT AEPB Inhale 1 puff into the lungs in the morning and at bedtime.   gabapentin (NEURONTIN) 100 MG capsule Take 1 capsule (100 mg total) by mouth 3 (three) times daily.   ipratropium-albuterol (DUONEB) 0.5-2.5 (3) MG/3ML SOLN Take 3 mLs by nebulization 3 (three) times daily.   LORazepam (ATIVAN) 0.5 MG tablet Take 1 tablet (0.5 mg total) by mouth every 8 (eight) hours.   naloxone (NARCAN) nasal spray 4 mg/0.1 mL For suspected overdose of narcotics.   omeprazole (PRILOSEC) 20 MG capsule Take 1 capsule (20 mg total) by mouth daily as needed. Take Lumakras 4 hr before or 10 hr after taking omeprazole.   oxyCODONE (OXY IR/ROXICODONE) 5 MG immediate release tablet Take 1-2 tablets (5-10 mg total) by mouth every 4 (four) hours as needed for moderate pain or severe pain.   oxyCODONE (OXYCONTIN) 10 mg 12 hr tablet Take 1 tablet (10 mg total) by mouth every 12 (twelve) hours.   protein supplement shake (PREMIER PROTEIN) LIQD Take 11 oz by mouth daily.   sennosides-docusate sodium (SENOKOT-S) 8.6-50 MG tablet Take 1 tablet by mouth daily.   Immunization History  Administered Date(s) Administered   Influenza,inj,Quad PF,6+ Mos 10/20/2021, 11/01/2022       Objective:   Physical Exam BP 110/72 (BP Location: Left Arm, Cuff Size: Normal)   Pulse (!) 126   SpO2 98%   SpO2: 98 % O2 Device: Nasal cannula O2 Flow Rate (L/min):  2 L/min  GENERAL: Cachectic woman, presents in transport chair due to significant dyspnea, significant tachypnea.  Appears uncomfortable.  Mild conversational dyspnea.  Very frail appearing. HEAD: Normocephalic, atraumatic.  EYES: Pupils equal, round, reactive to light.  No scleral icterus.  MOUTH: Poor dentition, oral mucosa moist.  No thrush. NECK: Supple. No thyromegaly. Trachea midline. No JVD.  No adenopathy. PULMONARY: Good air entry bilaterally.  Coarse on the right.  No adventitious sounds on left. CARDIOVASCULAR: S1 and S2.  Tachycardic rate and regular rhythm.  No murmur. ABDOMEN: Scaphoid, otherwise benign. MUSCULOSKELETAL: No joint deformity, no clubbing, no edema.  NEUROLOGIC: No overt focal deficit. SKIN: Intact,warm,dry. PSYCH: Anxious.       Assessment & Plan:     ICD-10-CM   1. Adenocarcinoma of right lung  C34.91    Stage IV Has failed  therapies Will have follow-up with oncology next week    2. Chronic respiratory failure with hypoxia  J96.11    Continue oxygen at 2 L/min Patient compliant with therapy    3. Acute bronchitis with COPD  J44.0    J20.9    Azithromycin Dosepak    4. COPD, severity to be determined  J44.9    Clinically severe Patient too debilitated for PFTs Continue Advair, continue DuoNeb    5. Shortness of breath  R06.02    Multifactorial Extensive cancer, COPD, cachexia    6. Tachycardia  R00.0    Multifactorial: Advanced cancer, poor nutrition Overall debility and frailty    7. 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    Dispense:  6 each    Refill:  0   Patient was encouraged to continue using oxygen.  Continue using nebulizer medications.  Encourage follow-up  with Elouise Munroe, NP next week, recommend discussion of goals of care.  We will see her in follow-up in 6 to 8 weeks time she is to call sooner should any new difficulties arise.   Gailen Shelter, MD Advanced Bronchoscopy [facility]-    *This note was dictated using voice recognition software/Dragon.  Despite best efforts to proofread, errors can occur which can change the meaning. Any transcriptional errors that result from this process are unintentional and may not be fully corrected at the time of dictation.

## 2023-03-21 NOTE — Assessment & Plan Note (Signed)
patient declines bisphosphonate. Status post IM nail with Dr. Bowers on 01/01/2023  

## 2023-03-22 ENCOUNTER — Telehealth: Payer: Self-pay

## 2023-03-22 ENCOUNTER — Encounter: Payer: Medicare Other | Admitting: Physician Assistant

## 2023-03-22 LAB — T4: T4, Total: 10.7 ug/dL (ref 4.5–12.0)

## 2023-03-22 NOTE — Telephone Encounter (Signed)
-----   Message from Rickard Patience, MD sent at 03/21/2023  9:01 PM EDT ----- Please let patient know that her additional blood work done today is more consistent with dehydration than SIADH which is another cause for hyponatremia.  Encourage oral hydration. I recommend IV fluid for hydration.  Please arrange patient to see Laurette Schimke ASAP for goals of care discussion.

## 2023-03-22 NOTE — Telephone Encounter (Signed)
Called and spoke to pt and informed her of MD recommendation and plan. Pt verbalized understanding.   Please schedule IVF early next week (Monday if possible) and if possible to move appt with Sharia Reeve Borders same day as IVF. Please call pt/ [name] with appts.   Also noticed that pt has multiple appts with Josh, please cancel. Additional appts will be scheduled when he see's her next week.

## 2023-03-26 ENCOUNTER — Emergency Department: Payer: Medicare Other

## 2023-03-26 ENCOUNTER — Inpatient Hospital Stay
Admission: EM | Admit: 2023-03-26 | Discharge: 2023-03-30 | DRG: 190 | Disposition: A | Discharge status: Hospice | Payer: Medicare Other | Attending: Internal Medicine | Admitting: Internal Medicine

## 2023-03-26 ENCOUNTER — Other Ambulatory Visit: Payer: Self-pay

## 2023-03-26 DIAGNOSIS — Z8639 Personal history of other endocrine, nutritional and metabolic disease: Secondary | ICD-10-CM

## 2023-03-26 DIAGNOSIS — C3490 Malignant neoplasm of unspecified part of unspecified bronchus or lung: Secondary | ICD-10-CM | POA: Diagnosis present

## 2023-03-26 DIAGNOSIS — E43 Unspecified severe protein-calorie malnutrition: Secondary | ICD-10-CM | POA: Insufficient documentation

## 2023-03-26 DIAGNOSIS — E871 Hypo-osmolality and hyponatremia: Secondary | ICD-10-CM | POA: Diagnosis not present

## 2023-03-26 DIAGNOSIS — J9611 Chronic respiratory failure with hypoxia: Secondary | ICD-10-CM | POA: Diagnosis present

## 2023-03-26 DIAGNOSIS — Z8249 Family history of ischemic heart disease and other diseases of the circulatory system: Secondary | ICD-10-CM

## 2023-03-26 DIAGNOSIS — R000 Tachycardia, unspecified: Secondary | ICD-10-CM | POA: Diagnosis present

## 2023-03-26 DIAGNOSIS — Z7951 Long term (current) use of inhaled steroids: Secondary | ICD-10-CM

## 2023-03-26 DIAGNOSIS — Z7952 Long term (current) use of systemic steroids: Secondary | ICD-10-CM

## 2023-03-26 DIAGNOSIS — R627 Adult failure to thrive: Secondary | ICD-10-CM | POA: Diagnosis present

## 2023-03-26 DIAGNOSIS — Z79620 Long term (current) use of immunosuppressive biologic: Secondary | ICD-10-CM

## 2023-03-26 DIAGNOSIS — D649 Anemia, unspecified: Secondary | ICD-10-CM | POA: Diagnosis present

## 2023-03-26 DIAGNOSIS — J441 Chronic obstructive pulmonary disease with (acute) exacerbation: Secondary | ICD-10-CM | POA: Diagnosis not present

## 2023-03-26 DIAGNOSIS — J449 Chronic obstructive pulmonary disease, unspecified: Secondary | ICD-10-CM

## 2023-03-26 DIAGNOSIS — R0600 Dyspnea, unspecified: Secondary | ICD-10-CM

## 2023-03-26 DIAGNOSIS — G9341 Metabolic encephalopathy: Secondary | ICD-10-CM | POA: Diagnosis present

## 2023-03-26 DIAGNOSIS — Z800 Family history of malignant neoplasm of digestive organs: Secondary | ICD-10-CM

## 2023-03-26 DIAGNOSIS — Z803 Family history of malignant neoplasm of breast: Secondary | ICD-10-CM

## 2023-03-26 DIAGNOSIS — Z1152 Encounter for screening for COVID-19: Secondary | ICD-10-CM

## 2023-03-26 DIAGNOSIS — R64 Cachexia: Secondary | ICD-10-CM | POA: Diagnosis present

## 2023-03-26 DIAGNOSIS — C7951 Secondary malignant neoplasm of bone: Secondary | ICD-10-CM | POA: Diagnosis present

## 2023-03-26 DIAGNOSIS — L89153 Pressure ulcer of sacral region, stage 3: Secondary | ICD-10-CM | POA: Diagnosis present

## 2023-03-26 DIAGNOSIS — Z7189 Other specified counseling: Secondary | ICD-10-CM

## 2023-03-26 DIAGNOSIS — J9811 Atelectasis: Secondary | ICD-10-CM | POA: Diagnosis present

## 2023-03-26 DIAGNOSIS — J910 Malignant pleural effusion: Secondary | ICD-10-CM | POA: Diagnosis present

## 2023-03-26 DIAGNOSIS — E860 Dehydration: Secondary | ICD-10-CM | POA: Diagnosis present

## 2023-03-26 DIAGNOSIS — Z9981 Dependence on supplemental oxygen: Secondary | ICD-10-CM

## 2023-03-26 DIAGNOSIS — C3491 Malignant neoplasm of unspecified part of right bronchus or lung: Secondary | ICD-10-CM

## 2023-03-26 DIAGNOSIS — Z833 Family history of diabetes mellitus: Secondary | ICD-10-CM

## 2023-03-26 DIAGNOSIS — Z888 Allergy status to other drugs, medicaments and biological substances status: Secondary | ICD-10-CM

## 2023-03-26 DIAGNOSIS — T451X5A Adverse effect of antineoplastic and immunosuppressive drugs, initial encounter: Secondary | ICD-10-CM | POA: Diagnosis present

## 2023-03-26 DIAGNOSIS — Z681 Body mass index (BMI) 19 or less, adult: Secondary | ICD-10-CM

## 2023-03-26 DIAGNOSIS — Z87891 Personal history of nicotine dependence: Secondary | ICD-10-CM

## 2023-03-26 DIAGNOSIS — K219 Gastro-esophageal reflux disease without esophagitis: Secondary | ICD-10-CM | POA: Diagnosis present

## 2023-03-26 DIAGNOSIS — Z66 Do not resuscitate: Secondary | ICD-10-CM | POA: Diagnosis present

## 2023-03-26 DIAGNOSIS — D6481 Anemia due to antineoplastic chemotherapy: Secondary | ICD-10-CM | POA: Diagnosis present

## 2023-03-26 DIAGNOSIS — Z7960 Long term (current) use of unspecified immunomodulators and immunosuppressants: Secondary | ICD-10-CM

## 2023-03-26 DIAGNOSIS — Z515 Encounter for palliative care: Secondary | ICD-10-CM

## 2023-03-26 DIAGNOSIS — Z79899 Other long term (current) drug therapy: Secondary | ICD-10-CM

## 2023-03-26 LAB — COMPREHENSIVE METABOLIC PANEL
ALT: 13 U/L (ref 0–44)
AST: 20 U/L (ref 15–41)
Albumin: 2.4 g/dL — ABNORMAL LOW (ref 3.5–5.0)
Alkaline Phosphatase: 105 U/L (ref 38–126)
Anion gap: 8 (ref 5–15)
BUN: 15 mg/dL (ref 8–23)
CO2: 34 mmol/L — ABNORMAL HIGH (ref 22–32)
Calcium: 8.3 mg/dL — ABNORMAL LOW (ref 8.9–10.3)
Chloride: 90 mmol/L — ABNORMAL LOW (ref 98–111)
Creatinine, Ser: 0.46 mg/dL (ref 0.44–1.00)
GFR, Estimated: >60 mL/min (ref >60)
Glucose, Bld: 113 mg/dL — ABNORMAL HIGH (ref 70–99)
Potassium: 3.6 mmol/L (ref 3.5–5.1)
Sodium: 132 mmol/L — ABNORMAL LOW (ref 135–145)
Total Bilirubin: 0.5 mg/dL (ref 0.3–1.2)
Total Protein: 6.1 g/dL — ABNORMAL LOW (ref 6.5–8.1)

## 2023-03-26 LAB — CBC
HCT: 31.8 % — ABNORMAL LOW (ref 36.0–46.0)
Hemoglobin: 10 g/dL — ABNORMAL LOW (ref 12.0–15.0)
MCH: 27.2 pg (ref 26.0–34.0)
MCHC: 31.4 g/dL (ref 30.0–36.0)
MCV: 86.6 fL (ref 80.0–100.0)
Platelets: 189 10*3/uL (ref 150–400)
RBC: 3.67 MIL/uL — ABNORMAL LOW (ref 3.87–5.11)
RDW: 14.4 % (ref 11.5–15.5)
WBC: 6.5 10*3/uL (ref 4.0–10.5)
nRBC: 0 % (ref 0.0–0.2)

## 2023-03-26 LAB — TROPONIN I (HIGH SENSITIVITY)
Troponin I (High Sensitivity): 6 ng/L (ref <18)
Troponin I (High Sensitivity): 5 ng/L (ref <18)

## 2023-03-26 LAB — SARS CORONAVIRUS 2 BY RT PCR: SARS Coronavirus 2 by RT PCR: NEGATIVE

## 2023-03-26 MED ORDER — DRONABINOL 2.5 MG PO CAPS
5.0000 mg | ORAL_CAPSULE | Freq: Two times a day (BID) | ORAL | Status: DC
  Administered 2023-03-27 – 2023-03-29 (×5): 5 mg via ORAL
  Filled 2023-03-26 (×5): qty 2

## 2023-03-26 MED ORDER — SENNOSIDES-DOCUSATE SODIUM 8.6-50 MG PO TABS
1.0000 | ORAL_TABLET | Freq: Every day | ORAL | Status: DC
  Administered 2023-03-26 – 2023-03-28 (×2): 1 via ORAL
  Filled 2023-03-26 (×4): qty 1

## 2023-03-26 MED ORDER — IPRATROPIUM-ALBUTEROL 0.5-2.5 (3) MG/3ML IN SOLN
3.0000 mL | Freq: Four times a day (QID) | RESPIRATORY_TRACT | Status: DC
  Administered 2023-03-26 – 2023-03-27 (×3): 3 mL via RESPIRATORY_TRACT
  Filled 2023-03-26 (×3): qty 3

## 2023-03-26 MED ORDER — IPRATROPIUM-ALBUTEROL 0.5-2.5 (3) MG/3ML IN SOLN
3.0000 mL | Freq: Once | RESPIRATORY_TRACT | Status: AC
  Administered 2023-03-26: 3 mL via RESPIRATORY_TRACT
  Filled 2023-03-26: qty 3

## 2023-03-26 MED ORDER — OXYCODONE HCL ER 10 MG PO T12A
10.0000 mg | EXTENDED_RELEASE_TABLET | Freq: Two times a day (BID) | ORAL | Status: DC
  Administered 2023-03-26 – 2023-03-30 (×7): 10 mg via ORAL
  Filled 2023-03-26 (×8): qty 1

## 2023-03-26 MED ORDER — LORAZEPAM 0.5 MG PO TABS
0.5000 mg | ORAL_TABLET | Freq: Three times a day (TID) | ORAL | Status: DC | PRN
  Administered 2023-03-28 – 2023-03-30 (×5): 0.5 mg via ORAL
  Filled 2023-03-26 (×5): qty 1

## 2023-03-26 MED ORDER — PREDNISONE 20 MG PO TABS
40.0000 mg | ORAL_TABLET | Freq: Every day | ORAL | Status: DC
  Administered 2023-03-27 – 2023-03-29 (×3): 40 mg via ORAL
  Filled 2023-03-26 (×3): qty 2

## 2023-03-26 MED ORDER — METHYLPREDNISOLONE SODIUM SUCC 125 MG IJ SOLR
125.0000 mg | Freq: Once | INTRAMUSCULAR | Status: AC
  Administered 2023-03-26: 125 mg via INTRAVENOUS
  Filled 2023-03-26: qty 2

## 2023-03-26 MED ORDER — IOHEXOL 350 MG/ML SOLN
75.0000 mL | Freq: Once | INTRAVENOUS | Status: AC | PRN
  Administered 2023-03-26: 45 mL via INTRAVENOUS

## 2023-03-26 MED ORDER — BENZONATATE 100 MG PO CAPS
100.0000 mg | ORAL_CAPSULE | Freq: Three times a day (TID) | ORAL | Status: DC | PRN
  Administered 2023-03-27 (×2): 100 mg via ORAL
  Filled 2023-03-26 (×2): qty 1

## 2023-03-26 MED ORDER — ENOXAPARIN SODIUM 40 MG/0.4ML IJ SOSY
40.0000 mg | PREFILLED_SYRINGE | INTRAMUSCULAR | Status: DC
  Administered 2023-03-26 – 2023-03-28 (×3): 40 mg via SUBCUTANEOUS
  Filled 2023-03-26 (×3): qty 0.4

## 2023-03-26 MED ORDER — SODIUM CHLORIDE 0.9 % IV BOLUS
1000.0000 mL | Freq: Once | INTRAVENOUS | Status: AC
  Administered 2023-03-26: 1000 mL via INTRAVENOUS

## 2023-03-26 MED ORDER — MOMETASONE FURO-FORMOTEROL FUM 200-5 MCG/ACT IN AERO
2.0000 | INHALATION_SPRAY | Freq: Two times a day (BID) | RESPIRATORY_TRACT | Status: DC
  Administered 2023-03-26 – 2023-03-29 (×5): 2 via RESPIRATORY_TRACT
  Filled 2023-03-26 (×2): qty 8.8

## 2023-03-26 MED ORDER — OXYCODONE HCL 5 MG PO TABS
5.0000 mg | ORAL_TABLET | Freq: Four times a day (QID) | ORAL | Status: DC | PRN
  Administered 2023-03-28: 10 mg via ORAL
  Filled 2023-03-26 (×2): qty 2

## 2023-03-26 MED ORDER — GABAPENTIN 100 MG PO CAPS
100.0000 mg | ORAL_CAPSULE | Freq: Three times a day (TID) | ORAL | Status: DC
  Administered 2023-03-26 – 2023-03-29 (×9): 100 mg via ORAL
  Filled 2023-03-26 (×9): qty 1

## 2023-03-26 NOTE — ED Notes (Signed)
Pt sleeping when RN entered room to obtain repeat troponin. Pt in no distress with normal work of breathing.

## 2023-03-26 NOTE — ED Notes (Signed)
XR at bedside

## 2023-03-26 NOTE — Assessment & Plan Note (Signed)
Patient has a history of stage IV adenocarcinoma of the lung with metastasis to the bone and pleura.  She she has completed first and second line treatment without success.  Per oncology note, discussing potential third line treatment.

## 2023-03-26 NOTE — Assessment & Plan Note (Signed)
Discussed CODE STATUS today in depth.  Please see below for more details.  She has an appointment tomorrow with palliative NP Laurette Schimke.  Will see if he is able to see her while she is admitted.

## 2023-03-26 NOTE — ED Notes (Signed)
Patient transported to CT 

## 2023-03-26 NOTE — ED Provider Notes (Signed)
4:26 PM Assumed care for off going team.   Blood pressure (!) 141/100, pulse (!) 118, temperature 97.8 F (36.6 C), resp. rate (!) 22, SpO2 100 %.  See their HPI for full report but in brief pending troponins, CT imaging  No evidence of PE CT imaging is similar to prior with known lung malignancy and metastatic disease.  Reevaluated patient.  Still having wheezing on examination heart rates around baseline.  Discussed discharge versus going home patient reportedly did not feel comfortable going home and preferred admission for COPD exacerbation, steroids and additional breathing treatments    Concha Se, MD 03/26/23 1733

## 2023-03-26 NOTE — ED Triage Notes (Signed)
Pt arrives via EMS from home for SOB. Pt has stage 4 lung ca and is on 2lpm via [facility] at home. Per EMS, pt 88% SpO2 at home. Pt received duoneb with improvement in work of breathing and SpO2. Pt arrives alert, does not appear in distress and says she is feeling better.

## 2023-03-26 NOTE — Assessment & Plan Note (Signed)
Chronic and unchanged.  Likely due to progressive debility in the setting of metastatic cancer.

## 2023-03-26 NOTE — Assessment & Plan Note (Signed)
Stable and unchanged at this time.  Likely secondary to bony metastasis and chemotherapy.

## 2023-03-26 NOTE — H&P (Signed)
History and Physical    Patient: Brittney Fisher:[id number] DOB: Dec 27, 1960 DOA: 03/26/2023 DOS: the patient was seen and examined on 03/26/2023 PCP: Pcp, No  Patient coming from: Home  Chief Complaint:  Chief Complaint  Patient presents with   Shortness of Breath   HPI: [name] is a 62 y.o. 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  [name] states that she has been experiencing shortness of breath chronically but has gradually worsened and especially became suddenly worse today.  She notes that her ability to walk around her home is very limited as she becomes short of breath with talking or walking a few steps.  She denies any increased cough at this time.  She denies any fever, chills, chest pain, nausea, vomiting, diarrhea, abdominal pain.  She denies any current lower extremity swelling.  ED course: On arrival to the ED, patient was normotensive at 110/63 with heart rate of 115.  She was saturating at 100% on her home 2 L with respiratory rate of 22.  She was afebrile at 97.8.  Initial workup notable for WBC of 6.5, hemoglobin 10.0, sodium 132, bicarb 34, glucose 113, creatinine 0.46 and GFR above 60. CTA was obtained that was negative for PE and demonstrated stable masslike consolidation of the right lung base, complete atelectasis of the left lung base and small bilateral pleural effusions, bulky mediastinal and hilar lymphadenopathy.  Patient started on Solu-Medrol and DuoNebs.  TRH contacted for admission.  Review of Systems: As mentioned in the history of present illness. All other systems reviewed and are negative.  Past Medical History:  Diagnosis Date   COPD (chronic obstructive pulmonary disease)    GERD (gastroesophageal reflux disease)    Metastatic lung  cancer (metastasis from lung to other site) 11/02/2020   Past Surgical History:  Procedure Laterality Date   COLONOSCOPY WITH ESOPHAGOGASTRODUODENOSCOPY (EGD)     COLONOSCOPY WITH PROPOFOL N/A 11/14/2015   Procedure: COLONOSCOPY WITH PROPOFOL;  Surgeon: Wallace Cullens, MD;  Location: [facility] ENDOSCOPY;  Service: Gastroenterology;  Laterality: N/A;   ESOPHAGOGASTRODUODENOSCOPY (EGD) WITH PROPOFOL N/A 11/14/2015   Procedure: ESOPHAGOGASTRODUODENOSCOPY (EGD) WITH PROPOFOL;  Surgeon: Wallace Cullens, MD;  Location: [facility] ENDOSCOPY;  Service: Gastroenterology;  Laterality: N/A;   FEMUR IM NAIL Right 01/01/2023   Procedure: TREATMENT, INTERTROCHANTERIC FEMORAL FRACTURE, WITH INTRAMEDULLARY IMPLANT (SURG);  Surgeon: Lyndle Herrlich, MD;  Location: [facility] ORS;  Service: Orthopedics;  Laterality: Right;   Social History:  reports that she quit smoking about 18 months ago. Her smoking use included cigarettes. She has a 40.00 pack-year smoking history. She has never used smokeless tobacco. She reports that she does not drink alcohol and does not use drugs.  Allergies  Allergen Reactions   Carboplatin Shortness Of Breath, Itching and Cough    Family History  Problem Relation Age of Onset   Breast cancer Cousin 49   Diabetes type II Mother    Hypertension Mother    Colon cancer Father    Hypertension Father     Prior to Admission medications   Medication Sig Start Date End Date Taking? Authorizing Provider  acetaminophen (TYLENOL) 500 MG tablet Take 1,000 mg by mouth every 8 (eight) hours as needed for moderate pain.     [provider]  albuterol (VENTOLIN HFA) 108 (90  Base) MCG/ACT inhaler Inhale 2 puffs into the lungs every 6 (six) hours as needed for wheezing or shortness of breath. 03/12/23   Rickard Patience, MD  azithromycin (ZITHROMAX) 250 MG tablet Take 2 tablets (500 mg) on  Day 1,  followed by 1 tablet (250 mg) once daily on Days 2 through 5. 03/21/23 03/26/23  Salena Saner, MD  benzonatate (TESSALON)  100 MG capsule Take 1 capsule by mouth three times daily as needed for cough 03/12/23   Rickard Patience, MD  dexamethasone (DECADRON) 2 MG tablet Take 1 tablet (2 mg total) by mouth daily. 02/07/23   Borders, Daryl Eastern, NP  dronabinol ([name]) 5 MG capsule Take 1 capsule (5 mg total) by mouth 2 (two) times daily before a meal. 03/07/23   Rickard Patience, MD  fluticasone-salmeterol (ADVAIR DISKUS) 250-50 MCG/ACT AEPB Inhale 1 puff into the lungs in the morning and at bedtime. 12/13/22 03/21/23  Borders, Daryl Eastern, NP  gabapentin (NEURONTIN) 100 MG capsule Take 1 capsule (100 mg total) by mouth 3 (three) times daily. 03/21/23   Rickard Patience, MD  ipratropium-albuterol (DUONEB) 0.5-2.5 (3) MG/3ML SOLN Take 3 mLs by nebulization 3 (three) times daily. 11/01/22   Rickard Patience, MD  LORazepam (ATIVAN) 0.5 MG tablet Take 1 tablet (0.5 mg total) by mouth every 8 (eight) hours. 02/07/23   Borders, Daryl Eastern, NP  naloxone [facility]) nasal spray 4 mg/0.1 mL For suspected overdose of narcotics. 12/05/22   Mauro Kaufmann, NP  omeprazole (PRILOSEC) 20 MG capsule Take 1 capsule (20 mg total) by mouth daily as needed. Take Lumakras 4 hr before or 10 hr after taking omeprazole. 01/28/23   Rickard Patience, MD  oxyCODONE (OXY IR/ROXICODONE) 5 MG immediate release tablet Take 1-2 tablets (5-10 mg total) by mouth every 4 (four) hours as needed for moderate pain or severe pain. 03/12/23   Rickard Patience, MD  oxyCODONE (OXYCONTIN) 10 mg 12 hr tablet Take 1 tablet (10 mg total) by mouth every 12 (twelve) hours. 03/21/23   Rickard Patience, MD  protein supplement shake (PREMIER PROTEIN) LIQD Take 11 oz by mouth daily.    [provider]  sennosides-docusate sodium (SENOKOT-S) 8.6-50 MG tablet Take 1 tablet by mouth daily.    [provider]    Physical Exam: Vitals:   03/26/23 1630 03/26/23 1700 03/26/23 1715 03/26/23 1730  BP: 126/81 104/72  109/71  Pulse: 96 (!) 120 (!) 118   Resp:      Temp:      SpO2: 95% 97%  100%   Physical Exam Vitals and nursing  note reviewed.  Constitutional:      Appearance: She is cachectic.  HENT:     Head: Normocephalic and atraumatic.  Eyes:     Extraocular Movements: Extraocular movements intact.     Pupils: Pupils are equal, round, and reactive to light.  Neck:     Vascular: No JVD.  Cardiovascular:     Rate and Rhythm: Regular rhythm. Tachycardia present.     Heart sounds: No murmur heard. Pulmonary:     Effort: Tachypnea and accessory muscle usage present.     Breath sounds: Decreased breath sounds (Decreased breath sounds predominantly on the right) and wheezing (Diffuse intermittent inspiratory and expiratory wheezing) present. No rhonchi or rales.  Abdominal:     Palpations: Abdomen is soft.     Tenderness: There is no abdominal tenderness.  Musculoskeletal:     Cervical back: Neck supple.     Right lower leg: No  tenderness. No edema.     Left lower leg: No tenderness. No edema.  Skin:    General: Skin is warm and dry.     Comments: Stage 2-3 decubitus ulcer on the sacrum, approximately 3 cm  Neurological:     General: No focal deficit present.     Mental Status: She is alert and oriented to person, place, and time.  Psychiatric:        Mood and Affect: Mood normal.        Behavior: Behavior normal.    Data Reviewed: CBC with WBC of 6.5, hemoglobin 10.0, MCV 86, platelets of 189 CMP with sodium of 132, potassium 3.6, bicarb 34, glucose 113, creatinine 0.46, BUN 15, calcium 8.3, albumin 2.4, AST 20, ALT 13 and GFR above 60 Troponin negative x 2 at 6 and 5  EKG personally reviewed.  Sinus rhythm with rate of 113.  Right axis deviation.  J-point elevation.  CT Angio Chest PE W and/or Wo Contrast  Result Date: 03/26/2023 CLINICAL DATA:  PE suspected, metastatic lung cancer * Tracking Code: BO * EXAM: CT ANGIOGRAPHY CHEST WITH CONTRAST TECHNIQUE: Multidetector CT imaging of the chest was performed using the standard protocol during bolus administration of intravenous contrast. Multiplanar CT  image reconstructions and MIPs were obtained to evaluate the vascular anatomy. RADIATION DOSE REDUCTION: This exam was performed according to the departmental dose-optimization program which includes automated exposure control, adjustment of the mA and/or kV according to patient size and/or use of iterative reconstruction technique. CONTRAST:  26mL OMNIPAQUE IOHEXOL 350 MG/ML SOLN COMPARISON:  CT chest abdomen pelvis, 03/08/2023 FINDINGS: Cardiovascular: Satisfactory opacification of the pulmonary arteries to the segmental level. No evidence of pulmonary embolism. Normal heart size. No pericardial effusion. Aortic atherosclerosis. Mediastinum/Nodes: Bulky, matted mediastinal and hilar lymphadenopathy, not significantly changed. Thyroid gland, trachea, and esophagus demonstrate no significant findings. Lungs/Pleura: Similar appearance the lungs with masslike consolidation of the right lung base, complete atelectasis or consolidation of the left lung base, and small bilateral pleural effusions with associated pleural masses and nodules. Unchanged scarring and volume loss of the right pulmonary apex (series 6, image 43). Upper Abdomen: No acute abnormality. Musculoskeletal: No chest wall abnormality. Interval increase in bony erosion of the posterior right tenth rib (series 5, image 196). Review of the MIP images confirms the above findings. IMPRESSION: 1. Negative examination for pulmonary embolism. 2. Similar appearance the lungs with masslike consolidation of the right lung base, complete atelectasis or consolidation of the left lung base, and small bilateral pleural effusions with associated pleural masses and nodules. 3. Bulky, matted mediastinal and hilar lymphadenopathy, not significantly changed. 4. Interval increase in bony erosion of the posterior right tenth rib. 5. Findings are consistent with primary lung malignancy and metastatic disease, similar to recent prior restaging examination dated 03/08/2023.  Aortic Atherosclerosis (ICD10-I70.0). Electronically Signed   By: Jearld Lesch M.D.   On: 03/26/2023 15:54   DG Chest Portable 1 View  Result Date: 03/26/2023 CLINICAL DATA:  Short of breath. EXAM: PORTABLE CHEST 1 VIEW COMPARISON:  03/02/2023 and older exams. FINDINGS: Right greater than left pleural effusions, right central, upper and lower lung opacities with distortion, left lung base opacities and bilateral ill-defined pulmonary nodules are without significant change from the most recent prior study. No pneumothorax. Heart normal in size. Skeletal structures grossly intact. IMPRESSION: 1. No acute findings. Right lung findings consistent with known lung carcinoma, with areas of treatment related lung scarring and lung base atelectasis. Bilateral pleural effusions. Left  lung base atelectasis and bilateral pulmonary nodules consistent with metastatic disease. These findings are stable from the recent prior studies. Electronically Signed   By: Amie Portlandavid  Ormond M.D.   On: 03/26/2023 10:38    Results are pending, will review when available.  Assessment and Plan:  * COPD exacerbation Patient has a history of clinically diagnosed COPD in the setting of extensive tobacco use previously.  She endorses gradually worsening shortness of breath that has significantly worsened today.  She has required increased use of her home inhalers and several nebulizer treatments on arrival.  No cough or focal opacities on imaging to suggest bacterial pneumonia.  - S/p Solu-Medrol 125 mg once - Start prednisone 40 mg tomorrow to complete a 5-day course - RVP and COVID-19 testing pending - DuoNebs every 6 hours - Continue home bronchodilators - PT/OT  Chronic respiratory failure with hypoxia Patient has been able to be weaned back to her home 2 L of supplemental oxygen.  - Continue supplemental oxygen to maintain oxygen saturation above 88% - Given elevated bicarb, will obtain a VBG  Tachycardia Chronic and  unchanged.  Likely due to progressive debility in the setting of metastatic cancer.  Hyponatremia Stable and mild at this time.  History of SIADH, however recent oncology note states hyponatremia likely due to dehydration.  - S/p 1 L IV fluids - Dietitian consulted  Primary malignant neoplasm of lung metastatic to other site Patient has a history of stage IV adenocarcinoma of the lung with metastasis to the bone and pleura.  She she has completed first and second line treatment without success.  Per oncology note, discussing potential third line treatment.  Goals of care, counseling/discussion Discussed CODE STATUS today in depth.  Please see below for more details.  She has an appointment tomorrow with palliative NP Laurette SchimkeJoshua Borders.  Will see if he is able to see her while she is admitted.  Normocytic anemia Stable and unchanged at this time.  Likely secondary to bony metastasis and chemotherapy.  Advance Care Planning:   Code Status: DNR/DNI.  Mrs. Malen GauzeFoster states that in the case of cardiac arrest, she she would not want to be resuscitated, but allowed to pass in peace.  When we were discussing pulmonary resuscitation, including intubation, we talked that given her progressive stage IV lung cancer that is bulky, cachexia and poor functional status, she would be unlikely to be successfully extubated.  After this discussion, she stated she would not want to be intubated.  Consults: None  Family Communication: No family at bedside  Severity of Illness: The appropriate patient status for this patient is OBSERVATION. Observation status is judged to be reasonable and necessary in order to provide the required intensity of service to ensure the patient's safety. The patient's presenting symptoms, physical exam findings, and initial radiographic and laboratory data in the context of their medical condition is felt to place them at decreased risk for further clinical deterioration. Furthermore, it is  anticipated that the patient will be medically stable for discharge from the hospital within 2 midnights of admission.   Author: Verdene LennertIulia Briarrose Shor, MD 03/26/2023 6:17 PM  For on call review [email or web address].

## 2023-03-26 NOTE — Assessment & Plan Note (Addendum)
Patient has a history of clinically diagnosed COPD in the setting of extensive tobacco use previously.  She endorses gradually worsening shortness of breath that has significantly worsened today.  She has required increased use of her home inhalers and several nebulizer treatments on arrival.  No cough or focal opacities on imaging to suggest bacterial pneumonia.  - S/p Solu-Medrol 125 mg once - Start prednisone 40 mg tomorrow to complete a 5-day course - RVP and COVID-19 testing pending - DuoNebs every 6 hours - Continue home bronchodilators - PT/OT

## 2023-03-26 NOTE — Assessment & Plan Note (Signed)
Stable and mild at this time.  History of SIADH, however recent oncology note states hyponatremia likely due to dehydration.  - S/p 1 L IV fluids - Dietitian consulted

## 2023-03-26 NOTE — Assessment & Plan Note (Signed)
Patient has been able to be weaned back to her home 2 L of supplemental oxygen.  - Continue supplemental oxygen to maintain oxygen saturation above 88% - Given elevated bicarb, will obtain a VBG

## 2023-03-26 NOTE — ED Provider Notes (Signed)
[facility] Provider Note    Event Date/Time   First MD Initiated Contact with Patient 03/26/23 1008     (approximate)  History   Chief Complaint: Shortness of Breath  HPI  Brittney Fisher is a 62 y.o. female with a past medical history of COPD, gastric reflux, stage IV lung cancer currently not on any treatments who presents to the emergency department for worsening shortness of breath.  According to EMS and the patient she wears 2 L of oxygen chronically, was feeling short of breath this morning after using the bathroom which the patient states is fairly typical for her, shortness of breath did not resolve so she called EMS.  EMS states 88% on 2 L upon their arrival, was given a DuoNeb and sats increased into the 90s on 2 L.  Currently patient is 99% on 2 L and states she is feeling much better.  Denies any chest pain.  Physical Exam   Triage Vital Signs: ED Triage Vitals [03/26/23 1013]  Enc Vitals Group     BP 106/76     Pulse Rate (!) 114     Resp (!) 22     Temp 97.8 F (36.6 C)     Temp src      SpO2 99 %     Weight      Height      Head Circumference      Peak Flow      Pain Score 0     Pain Loc      Pain Edu?      Excl. in GC?     Most recent vital signs: Vitals:   03/26/23 1013  BP: 106/76  Pulse: (!) 114  Resp: (!) 22  Temp: 97.8 F (36.6 C)  SpO2: 99%    General: Awake, no distress.  CV:  Good peripheral perfusion.  Regular rate and rhythm  Resp:  Normal effort.  Equal breath sounds bilaterally.  No obvious or significant wheeze rales or rhonchi. Abd:  No distention.  Soft, nontender.  No rebound or guarding. Other:  Stage I sacral pressure ulcer.   ED Results / Procedures / Treatments   EKG  EKG viewed and interpreted by myself shows sinus tachycardia at 113 bpm with a narrow QRS, right axis deviation, largely normal intervals with nonspecific but no concerning ST changes.  RADIOLOGY  I reviewed and interpreted  chest x-ray images.  Patient appears to have some right-sided opacities on my evaluation.  Patient's chest x-ray read as no acute findings with right lung findings consistent with known lung carcinoma and treatment related scarring.   MEDICATIONS ORDERED IN ED: Medications  ipratropium-albuterol (DUONEB) 0.5-2.5 (3) MG/3ML nebulizer solution 3 mL (has no administration in time range)     IMPRESSION / MDM / ASSESSMENT AND PLAN / ED COURSE  I reviewed the triage vital signs and the nursing notes.  Patient's presentation is most consistent with acute presentation with potential threat to life or bodily function.  Patient presents to the emergency department for worsening shortness of breath this morning.  88% on 2 L per EMS, after DuoNeb patient satting in the mid to upper 90s on 2 L.  Patient is somewhat tachycardic but did just receive a breathing treatment.  Will continue to closely monitor in the emergency department.  Will check labs including cardiac enzymes and obtain a chest x-ray to further evaluate to rule out infection such as pneumonia or pneumothorax.  Chest x-ray  shows stable changes in the lungs.  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  Bicarb is elevated somewhat at 34.  Patient CBC shows no significant findings, mild anemia although not significantly changed from prior labs.  Troponin reassuringly negative.  Patient remains mildly tachycardic we will dose a liter of IV fluids.  States shortness of breath is improved but still present currently satting 100% on her 2 L which is her baseline O2 requirement.  We will obtain a CTA of the chest as a precaution to rule out pulmonary embolism given her tachycardia and history of cancer.  We will dose IV fluids 1 additional DuoNeb and continue to closely monitor.  Patient agreeable to plan of care.  Patient's IV infiltrated prior to CT scan.  CTA pending.  Patient care signed out to oncoming  provider.  Repeat troponin was negative.  FINAL CLINICAL IMPRESSION(S) / ED DIAGNOSES   Dyspnea   Note:  This document was prepared using Dragon voice recognition software and may include unintentional dictation errors.   Minna Antis, MD 03/26/23 1520

## 2023-03-27 ENCOUNTER — Encounter: Payer: Medicaid Other | Admitting: Hospice and Palliative Medicine

## 2023-03-27 ENCOUNTER — Inpatient Hospital Stay: Payer: Medicare Other | Admitting: Hospice and Palliative Medicine

## 2023-03-27 ENCOUNTER — Other Ambulatory Visit: Payer: Self-pay | Admitting: Oncology

## 2023-03-27 ENCOUNTER — Inpatient Hospital Stay: Payer: Medicare Other

## 2023-03-27 DIAGNOSIS — J441 Chronic obstructive pulmonary disease with (acute) exacerbation: Secondary | ICD-10-CM | POA: Diagnosis not present

## 2023-03-27 DIAGNOSIS — R627 Adult failure to thrive: Secondary | ICD-10-CM | POA: Diagnosis present

## 2023-03-27 DIAGNOSIS — J9811 Atelectasis: Secondary | ICD-10-CM | POA: Diagnosis present

## 2023-03-27 DIAGNOSIS — K219 Gastro-esophageal reflux disease without esophagitis: Secondary | ICD-10-CM | POA: Diagnosis present

## 2023-03-27 DIAGNOSIS — T451X5A Adverse effect of antineoplastic and immunosuppressive drugs, initial encounter: Secondary | ICD-10-CM | POA: Diagnosis present

## 2023-03-27 DIAGNOSIS — Z79899 Other long term (current) drug therapy: Secondary | ICD-10-CM | POA: Diagnosis not present

## 2023-03-27 DIAGNOSIS — E871 Hypo-osmolality and hyponatremia: Secondary | ICD-10-CM | POA: Diagnosis present

## 2023-03-27 DIAGNOSIS — E860 Dehydration: Secondary | ICD-10-CM | POA: Diagnosis present

## 2023-03-27 DIAGNOSIS — Z681 Body mass index (BMI) 19 or less, adult: Secondary | ICD-10-CM | POA: Diagnosis not present

## 2023-03-27 DIAGNOSIS — Z66 Do not resuscitate: Secondary | ICD-10-CM | POA: Diagnosis present

## 2023-03-27 DIAGNOSIS — Z7960 Long term (current) use of unspecified immunomodulators and immunosuppressants: Secondary | ICD-10-CM | POA: Diagnosis not present

## 2023-03-27 DIAGNOSIS — C3490 Malignant neoplasm of unspecified part of unspecified bronchus or lung: Secondary | ICD-10-CM | POA: Diagnosis present

## 2023-03-27 DIAGNOSIS — J9611 Chronic respiratory failure with hypoxia: Secondary | ICD-10-CM | POA: Diagnosis present

## 2023-03-27 DIAGNOSIS — R64 Cachexia: Secondary | ICD-10-CM | POA: Diagnosis present

## 2023-03-27 DIAGNOSIS — L89153 Pressure ulcer of sacral region, stage 3: Secondary | ICD-10-CM | POA: Diagnosis present

## 2023-03-27 DIAGNOSIS — D6481 Anemia due to antineoplastic chemotherapy: Secondary | ICD-10-CM | POA: Diagnosis present

## 2023-03-27 DIAGNOSIS — D649 Anemia, unspecified: Secondary | ICD-10-CM | POA: Diagnosis present

## 2023-03-27 DIAGNOSIS — R000 Tachycardia, unspecified: Secondary | ICD-10-CM | POA: Diagnosis present

## 2023-03-27 DIAGNOSIS — G9341 Metabolic encephalopathy: Secondary | ICD-10-CM | POA: Diagnosis present

## 2023-03-27 DIAGNOSIS — Z1152 Encounter for screening for COVID-19: Secondary | ICD-10-CM | POA: Diagnosis not present

## 2023-03-27 DIAGNOSIS — Z515 Encounter for palliative care: Secondary | ICD-10-CM | POA: Diagnosis not present

## 2023-03-27 DIAGNOSIS — J910 Malignant pleural effusion: Secondary | ICD-10-CM | POA: Diagnosis present

## 2023-03-27 DIAGNOSIS — Z9981 Dependence on supplemental oxygen: Secondary | ICD-10-CM | POA: Diagnosis not present

## 2023-03-27 DIAGNOSIS — C7951 Secondary malignant neoplasm of bone: Secondary | ICD-10-CM | POA: Diagnosis present

## 2023-03-27 LAB — BASIC METABOLIC PANEL
Anion gap: 10 (ref 5–15)
BUN: 15 mg/dL (ref 8–23)
CO2: 31 mmol/L (ref 22–32)
Calcium: 8.3 mg/dL — ABNORMAL LOW (ref 8.9–10.3)
Chloride: 92 mmol/L — ABNORMAL LOW (ref 98–111)
Creatinine, Ser: 0.37 mg/dL — ABNORMAL LOW (ref 0.44–1.00)
GFR, Estimated: >60 mL/min (ref >60)
Glucose, Bld: 154 mg/dL — ABNORMAL HIGH (ref 70–99)
Potassium: 4 mmol/L (ref 3.5–5.1)
Sodium: 133 mmol/L — ABNORMAL LOW (ref 135–145)

## 2023-03-27 LAB — CBC WITH DIFFERENTIAL/PLATELET
Abs Immature Granulocytes: 0.01 10*3/uL (ref 0.00–0.07)
Basophils Absolute: 0 10*3/uL (ref 0.0–0.1)
Basophils Relative: 0 %
Eosinophils Absolute: 0 10*3/uL (ref 0.0–0.5)
Eosinophils Relative: 0 %
HCT: 30.2 % — ABNORMAL LOW (ref 36.0–46.0)
Hemoglobin: 9.7 g/dL — ABNORMAL LOW (ref 12.0–15.0)
Immature Granulocytes: 0 %
Lymphocytes Relative: 4 %
Lymphs Abs: 0.2 10*3/uL — ABNORMAL LOW (ref 0.7–4.0)
MCH: 27.7 pg (ref 26.0–34.0)
MCHC: 32.1 g/dL (ref 30.0–36.0)
MCV: 86.3 fL (ref 80.0–100.0)
Monocytes Absolute: 0.1 10*3/uL (ref 0.1–1.0)
Monocytes Relative: 2 %
Neutro Abs: 4.2 10*3/uL (ref 1.7–7.7)
Neutrophils Relative %: 94 %
Platelets: 201 10*3/uL (ref 150–400)
RBC: 3.5 MIL/uL — ABNORMAL LOW (ref 3.87–5.11)
RDW: 14.5 % (ref 11.5–15.5)
WBC: 4.5 10*3/uL (ref 4.0–10.5)
nRBC: 0 % (ref 0.0–0.2)

## 2023-03-27 LAB — RESPIRATORY PANEL BY PCR

## 2023-03-27 MED ORDER — ADULT MULTIVITAMIN W/MINERALS CH
1.0000 | ORAL_TABLET | Freq: Every day | ORAL | Status: DC
  Administered 2023-03-28 – 2023-03-29 (×2): 1 via ORAL
  Filled 2023-03-27 (×2): qty 1

## 2023-03-27 MED ORDER — IPRATROPIUM-ALBUTEROL 0.5-2.5 (3) MG/3ML IN SOLN
3.0000 mL | Freq: Two times a day (BID) | RESPIRATORY_TRACT | Status: DC
  Administered 2023-03-27 – 2023-03-30 (×6): 3 mL via RESPIRATORY_TRACT
  Filled 2023-03-27 (×6): qty 3

## 2023-03-27 MED ORDER — VITAMIN C 500 MG PO TABS
500.0000 mg | ORAL_TABLET | Freq: Two times a day (BID) | ORAL | Status: DC
  Administered 2023-03-27 – 2023-03-29 (×5): 500 mg via ORAL
  Filled 2023-03-27 (×5): qty 1

## 2023-03-27 MED ORDER — ALBUTEROL SULFATE HFA 108 (90 BASE) MCG/ACT IN AERS
2.0000 | INHALATION_SPRAY | Freq: Four times a day (QID) | RESPIRATORY_TRACT | Status: DC | PRN
  Administered 2023-03-28 – 2023-03-29 (×2): 2 via RESPIRATORY_TRACT
  Filled 2023-03-27: qty 6.7

## 2023-03-27 MED ORDER — ALBUTEROL SULFATE (2.5 MG/3ML) 0.083% IN NEBU: 2.5000 mg | INHALATION_SOLUTION | Freq: Four times a day (QID) | RESPIRATORY_TRACT | Status: DC | PRN

## 2023-03-27 MED ORDER — ACETAMINOPHEN 325 MG PO TABS
650.0000 mg | ORAL_TABLET | Freq: Four times a day (QID) | ORAL | Status: DC | PRN
  Administered 2023-03-27 (×2): 650 mg via ORAL
  Filled 2023-03-27 (×2): qty 2

## 2023-03-27 MED ORDER — ENSURE ENLIVE PO LIQD
237.0000 mL | Freq: Three times a day (TID) | ORAL | Status: DC
  Administered 2023-03-27 – 2023-03-28 (×4): 237 mL via ORAL

## 2023-03-27 NOTE — Discharge Instructions (Signed)
Some PCP options in [place] area- not a comprehensive list  [facility]- [phone] [place]- [phone] [facility]- [phone] [facility]- [phone] [facility]- [phone] [place]- [phone]  or [place] [facility] Referral Line [phone]  

## 2023-03-27 NOTE — Evaluation (Signed)
Occupational Therapy Evaluation Patient Details Name: Brittney Fisher MRN: [id number] DOB: [birth year] Today's Date: 03/27/2023   History of Present Illness Brittney Fisher is a 62 y.o. 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   Clinical Impression   Upon entering the room, pt supine in bed and agreeable to OT intervention. Pt reports living at home with family who have been assisting with LB self care. Pt on 2Ls O2 via [place] at home. She has been very limited at home as far as mobility not because she can't physically perform but because she is SOB with all activity. Pt performing bed mobility and transfer to recliner chair with supervision. Pt able to pull pants over B hips while standing but needing min A to don socks at baseline. Pt with no skilled OT needs at this time. OT to complete orders.      Recommendations for follow up therapy are one component of a multi-disciplinary discharge planning process, led by the attending physician.  Recommendations may be updated based on patient status, additional functional criteria and insurance authorization.   Assistance Recommended at Discharge Intermittent Supervision/Assistance  Patient can return home with the following A little help with bathing/dressing/bathroom;Assistance with cooking/housework;Assist for transportation;Help with stairs or ramp for entrance             Precautions / Restrictions Precautions Precautions: Fall Restrictions Weight Bearing Restrictions: No      Mobility Bed Mobility Overal bed mobility: Needs Assistance Bed Mobility: Supine to Sit     Supine to sit: Supervision          Transfers Overall transfer level: Needs assistance   Transfers: Bed to chair/wheelchair/BSC   Stand pivot transfers:  Supervision                Balance Overall balance assessment: Needs assistance Sitting-balance support: Feet supported Sitting balance-Leahy Scale: Good     Standing balance support: Bilateral upper extremity supported, During functional activity Standing balance-Leahy Scale: Fair                             ADL either performed or assessed with clinical judgement   ADL                           Toilet Transfer: Radiographer, therapeuticupervision/safety Toilet Transfer Details (indicate cue type and reason): simulated Toileting- Clothing Manipulation and Hygiene: Minimal assistance Toileting - Clothing Manipulation Details (indicate cue type and reason): to don socks             Vision Patient Visual Report: No change from baseline              Pertinent Vitals/Pain Pain Assessment Pain Assessment: Faces Faces Pain Scale: Hurts little more Pain Location: R LE Pain Descriptors / Indicators: Discomfort, Sore, Grimacing Pain Intervention(s): Monitored during session, Repositioned     Hand Dominance     Extremity/Trunk Assessment Upper Extremity Assessment Upper Extremity Assessment: Overall WFL for tasks assessed   Lower Extremity Assessment Lower Extremity Assessment: RLE deficits/detail RLE Deficits / Details: painful. Reprots "thats the one I had surgery on" RLE: Unable to fully assess due to pain RLE Coordination: decreased gross motor   Cervical / Trunk Assessment Cervical / Trunk  Assessment: Normal   Communication Communication Communication: No difficulties   Cognition Arousal/Alertness: Awake/alert Behavior During Therapy: Anxious Overall Cognitive Status: Within Functional Limits for tasks assessed                                                  Home Living Family/patient expects to be discharged to:: Private residence Living Arrangements: Parent;Other relatives Available Help at Discharge: Family;Available 24  hours/day Type of Home: House Home Access: Stairs to enter [employer] of Steps: 2   Home Layout: One level     Bathroom Shower/Tub: Chief Strategy Officer: Standard     Home Equipment: Agricultural consultant (2 wheels);Grab bars - toilet;Shower seat;BSC/3in1          Prior Functioning/Environment Prior Level of Function : Needs assist       Physical Assist : Mobility (physical) Mobility (physical): Gait   Mobility Comments: reports she has been ambulating very little at home recently due to SOB ADLs Comments: Pt has been sink bathing/basin bathing and use of BSC for toileting needs secondary to SOB but pt reports being mod I at baseline other than assistance to don socks and shoes                 OT Goals(Current goals can be found in the care plan section) Acute Rehab OT Goals Patient Stated Goal: to go home and not be SOB OT Goal Formulation: With patient Time For Goal Achievement: 03/27/23 Potential to Achieve Goals: Good  OT Frequency:         AM-PAC OT "6 Clicks" Daily Activity     Outcome Measure Help from another person eating meals?: None Help from another person taking care of personal grooming?: None Help from another person toileting, which includes using toliet, bedpan, or urinal?: None Help from another person bathing (including washing, rinsing, drying)?: None Help from another person to put on and taking off regular upper body clothing?: None Help from another person to put on and taking off regular lower body clothing?: A Little 6 Click Score: 23   End of Session Nurse Communication: Mobility status  Activity Tolerance: Patient tolerated treatment well Patient left: in bed;with call bell/phone within reach;with bed alarm set                   Time: 5366-4403 OT Time Calculation (min): 15 min Charges:  OT General Charges $OT Visit: 1 Visit OT Evaluation $OT Eval Moderate Complexity: 1 [address], MS, OTR/L ,  CBIS ascom [phone]  03/27/23, 2:27 PM

## 2023-03-27 NOTE — Progress Notes (Signed)
Initial Nutrition Assessment  DOCUMENTATION CODES:   Severe malnutrition in context of chronic illness  INTERVENTION:   Ensure Enlive po TID, each supplement provides 350 kcal and 20 grams of protein.  Magic cup TID with meals, each supplement provides 290 kcal and 9 grams of protein  MVI po daily   Vitamin C 500mg  po BID  Pt at high refeed risk; recommend monitor potassium, magnesium and phosphorus labs daily until stable  NUTRITION DIAGNOSIS:   Severe Malnutrition related to cancer and cancer related treatments, other (see comment) (COPD) as evidenced by 26 percent weight loss in 7 months, severe fat depletion, severe muscle depletion.  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  Met with pt in room today. Pt reports poor appetite and oral intake at baseline. Pt eating only sips and bites of meals in hospital. Pt reports that she has been drinking vanilla Ensure at home; pt is unable to tolerate the chocolate flavors. Pt is on [name] for appetite stimulant. Per chart, pt is down ~40lbs(26%) over the past 7 months; this is severe weight loss. RD will add supplements and vitamins to help pt meet her estimated needs and to support wound healing. Pt is at high refeed risk. Pt is followed by the Dietitian at the cancer center.   Medications reviewed and include: marinol, lovenox, oxycodone, prednisone  Labs reviewed: Na 133(L), K 4.0 wnl, creat 0.37(L) Hgb 9.7(L), Hct 30.2(L)  NUTRITION - FOCUSED PHYSICAL EXAM:  Flowsheet Row Most Recent Value  Orbital Region Moderate depletion  Upper Arm Region Severe depletion  Thoracic and Lumbar Region Severe depletion  Buccal Region Moderate  depletion  Temple Region Moderate depletion  Clavicle Bone Region Severe depletion  Clavicle and Acromion Bone Region Severe depletion  Scapular Bone Region Severe depletion  Dorsal Hand Severe depletion  Patellar Region Severe depletion  Anterior Thigh Region Severe depletion  Posterior Calf Region Severe depletion  Edema (RD Assessment) None  Hair Reviewed  Eyes Reviewed  Mouth Reviewed  Skin Reviewed  Nails Reviewed   Diet Order:   Diet Order             Diet regular Room service appropriate? Yes; Fluid consistency: Thin  Diet effective now                  EDUCATION NEEDS:   Education needs have been addressed  Skin:  Skin Assessment: Reviewed RN Assessment (Stage II thigh, Stage II sacrum)  Last BM:  4/9  Height:   Ht Readings from Last 1 Encounters:  03/21/23 5\' 11"  (1.803 m)    Weight:   Wt Readings from Last 1 Encounters:  03/27/23 52.5 kg    Ideal Body Weight:  70.4 kg  BMI:  Body mass index is 16.15 kg/m.  Estimated Nutritional Needs:   Kcal:  1700-1900kcal/day  Protein:  85-95g/day  Fluid:  1.6-1.8L/day  Betsey Holiday MS, RD, LDN Please refer to [facility] for RD and/or RD on-call/weekend/after hours pager

## 2023-03-27 NOTE — Evaluation (Signed)
Physical Therapy Evaluation Patient Details Name: Brittney KnudsenVanessa J Hofferber MRN: [id number] DOB: 05-May-1961 Today's Date: 03/27/2023  History of Present Illness  Brittney Fisher is a 62 y.o. 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   Clinical Impression  Patient received in bed, she states today is not a good day. Patient having trouble breathing. States she was told she was hallucinating earlier and will be staying another night. Patient is alert and oriented during evaluation. She needed min A with bed mob due to pain in right LE. She was able to pivot to recliner with min guard assist. She declined further activity at this time due to difficulty breathing. Patient will continue to benefit from skilled PT to improve functional independence, strength and safety with mobility.        Recommendations for follow up therapy are one component of a multi-disciplinary discharge planning process, led by the attending physician.  Recommendations may be updated based on patient status, additional functional criteria and insurance authorization.  Follow Up Recommendations       Assistance Recommended at Discharge Intermittent Supervision/Assistance  Patient can return home with the following  A little help with walking and/or transfers;A little help with bathing/dressing/bathroom;Assistance with cooking/housework;Assist for transportation;Help with stairs or ramp for entrance    Equipment Recommendations None recommended by PT  Recommendations for Other Services       Functional Status Assessment Patient has had a recent decline in their functional status and demonstrates the ability to make significant improvements in function in a reasonable and predictable amount of time.     Precautions /  Restrictions Precautions Precautions: Fall Restrictions Weight Bearing Restrictions: No      Mobility  Bed Mobility Overal bed mobility: Needs Assistance Bed Mobility: Supine to Sit     Supine to sit: Min assist     General bed mobility comments: R LE pain requiring min A to bring it off bed.    Transfers Overall transfer level: Needs assistance   Transfers: Bed to chair/wheelchair/BSC   Stand pivot transfers: Min guard         General transfer comment: stood and pivoted around to recliner with supervision/min guard, declined further mobility due to SOB    Ambulation/Gait                  Stairs            Wheelchair Mobility    Modified Rankin (Stroke Patients Only)       Balance Overall balance assessment: Needs assistance Sitting-balance support: Feet supported Sitting balance-Leahy Scale: Good     Standing balance support: Bilateral upper extremity supported, During functional activity Standing balance-Leahy Scale: Fair                               Pertinent Vitals/Pain Pain Assessment Pain Assessment: Faces Faces Pain Scale: Hurts little more Pain Location: R LE Pain Descriptors / Indicators: Discomfort, Sore, Grimacing Pain Intervention(s): Monitored during session, Repositioned    Home Living Family/patient expects to be discharged to:: Private residence Living Arrangements: Parent;Other relatives Available Help at Discharge: Family;Available 24 hours/day (lives with her mother and sister) Type of Home: House Home Access: Stairs to enter   [employer] CorporationEntrance Stairs-Number of Steps: 2  Home Layout: One level Home Equipment: Agricultural consultant (2 wheels);Grab bars - toilet      Prior Function Prior Level of Function : Needs assist       Physical Assist : Mobility (physical) Mobility (physical): Gait   Mobility Comments: reports she has been ambulating very little at home recently due to SOB       Hand Dominance         Extremity/Trunk Assessment   Upper Extremity Assessment Upper Extremity Assessment: Defer to OT evaluation    Lower Extremity Assessment Lower Extremity Assessment: RLE deficits/detail RLE Deficits / Details: painful. Reprots "thats the one I had surgery on" RLE: Unable to fully assess due to pain RLE Coordination: decreased gross motor    Cervical / Trunk Assessment Cervical / Trunk Assessment: Normal  Communication   Communication: No difficulties  Cognition Arousal/Alertness: Awake/alert Behavior During Therapy: Anxious Overall Cognitive Status: Within Functional Limits for tasks assessed                                          General Comments      Exercises     Assessment/Plan    PT Assessment Patient needs continued PT services  PT Problem List Decreased strength;Decreased activity tolerance;Decreased balance;Decreased mobility;Cardiopulmonary status limiting activity;Pain       PT Treatment Interventions DME instruction;Therapeutic exercise;Gait training;Stair training;Functional mobility training;Therapeutic activities;Patient/family education;Balance training    PT Goals (Current goals can be found in the Care Plan section)  Acute Rehab PT Goals Patient Stated Goal: to breathe better PT Goal Formulation: With patient Time For Goal Achievement: 04/10/23 Potential to Achieve Goals: Fair    Frequency Min 3X/week     Co-evaluation               AM-PAC PT "6 Clicks" Mobility  Outcome Measure Help needed turning from your back to your side while in a flat bed without using bedrails?: None Help needed moving from lying on your back to sitting on the side of a flat bed without using bedrails?: None Help needed moving to and from a bed to a chair (including a wheelchair)?: A Little Help needed standing up from a chair using your arms (e.g., wheelchair or bedside chair)?: A Little Help needed to walk in hospital room?: A  Lot Help needed climbing 3-5 steps with a railing? : A Lot 6 Click Score: 18    End of Session Equipment Utilized During Treatment: Oxygen Activity Tolerance: Patient limited by fatigue;Other (comment) (SOB) Patient left: in chair;with call bell/phone within reach;with chair alarm set Nurse Communication: Mobility status PT Visit Diagnosis: Other abnormalities of gait and mobility (R26.89);Muscle weakness (generalized) (M62.81);Difficulty in walking, not elsewhere classified (R26.2);Pain Pain - Right/Left: Right Pain - part of body: Leg    Time: 1010-1027 PT Time Calculation (min) (ACUTE ONLY): 17 min   Charges:   PT Evaluation $PT Eval Moderate Complexity: 1 Mod          Lockie Bothun, PT, GCS 03/27/23,10:43 AM

## 2023-03-27 NOTE — Progress Notes (Signed)
Progress Note   Patient: Brittney Fisher YWV:[id number] DOB: 16-Jun-1961 DOA: 03/26/2023     0 DOS: the patient was seen and examined on 03/27/2023    Subjective:  I did see patient's this morning in the presence of the nurse Requiring 2 L of intranasal oxygen According to the nurse patient was found hallucinating early part of today Still having difficulty breathing with difficulty completing her sentences Denied any chest pain, nausea vomiting or abdominal pain She asked whether she could be discharged today and I made her aware that her breathing is not the best to be able to leave the hospital.  Brief hospital course: Brittney Fisher is a 62 y.o. 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   Brittney Fisher states that she has been experiencing shortness of breath chronically but has gradually worsened and especially became suddenly worse on the day of presentation. She is currently being managed for acute exacerbation of COPD  Assessment and Plan:  COPD exacerbation Patient has a history of clinically diagnosed COPD in the setting of extensive tobacco use previously.  She endorses gradually worsening shortness of breath that has significantly worsened today.  She has required increased use of her home inhalers and several nebulizer treatments on arrival.  No cough or focal opacities on imaging to suggest bacterial pneumonia.   - S/p Solu-Medrol 125 mg once - Continue prednisone 40 mg to complete a 5-day course - RVP and COVID-19 testing pending - DuoNebs every 6 hours - Continue home bronchodilators - PT/OT   Chronic respiratory failure with hypoxia Patient has been able to be weaned back to her home 2 L of supplemental oxygen. - Continue supplemental oxygen to maintain oxygen  saturation above 88%    Tachycardia Chronic and unchanged.  Likely due to progressive debility in the setting of metastatic cancer.   Hyponatremia Stable and mild at this time.  History of SIADH, however recent oncology note states hyponatremia likely due to dehydration.   - S/p 1 L IV fluids - Dietitian consulted   Primary malignant neoplasm of lung metastatic to other site Patient has a history of stage IV adenocarcinoma of the lung with metastasis to the bone and pleura.  She she has completed first and second line treatment without success.   Per oncology note, discussing potential third line treatment.   Goals of care, counseling/discussion Palliative care consulted by admitting provider    Sacral decubitus ulcers Stage III Wound care consulted  Normocytic anemia Stable and unchanged at this time.  Likely secondary to bony metastasis and chemotherapy.   Advance Care Planning:   Code Status: DNR/DNI.   Family Communication: No family at bedside   Severity of Illness: The appropriate patient statu   Data Reviewed: Have reviewed patient's CBC as well as BMP results   Time spent: 45 minutes       Physical Exam:  Vitals and nursing note reviewed.  Constitutional:      Appearance: She is cachectic.  HENT:     Head: Normocephalic and atraumatic.  Cardiovascular:     Rate and Rhythm: Regular rhythm. Tachycardia present.     Heart sounds: No murmur heard. Pulmonary: Tachypneic with use of accessory muscle Abdominal:     Palpations: Abdomen is soft.     Tenderness: There is no abdominal  tenderness.  Musculoskeletal:     Cervical back: Neck supple.     Right lower leg: No tenderness. No edema.     Left lower leg: No tenderness. No edema.  Skin:    General: Skin is warm and dry.     Comments: Stage 2-3 decubitus ulcer on the sacrum, approximately 3 cm   Vitals:   03/27/23 0823 03/27/23 1034 03/27/23 1550 03/27/23 1615  BP: 115/78   103/74  Pulse: (!) 120    (!) 42  Resp: 20   20  Temp: 98.2 F (36.8 C)   98.3 F (36.8 C)  TempSrc:      SpO2: 98% 96%  100%  Weight:   52.5 kg     Author: Loyce Dys, MD 03/27/2023 5:01 PM  For on call review [email or web address].

## 2023-03-27 NOTE — Plan of Care (Signed)
  Problem: Nutrition: Goal: Adequate nutrition will be maintained Outcome: Progressing   Problem: Safety: Goal: Ability to remain free from injury will improve Outcome: Progressing   

## 2023-03-27 NOTE — TOC Initial Note (Signed)
Transition of Care [facility]) - Initial/Assessment Note    Patient Details  Name: Brittney Fisher MRN: [id number] Date of Birth: 1961/10/07  [facility] CM/SW Contact:    Chapman Fitch, RN Phone Number: 03/27/2023, 7:56 AM  Clinical Narrative:                  No PCP listed on file.  Added list of local PCP to AVS       Patient Goals and CMS Choice            Expected Discharge Plan and Services                                              Prior Living Arrangements/Services                       Activities of Daily Living      Permission Sought/Granted                  Emotional Assessment              Admission diagnosis:  COPD exacerbation [J44.1] Dyspnea, unspecified type [R06.00] Chronic obstructive pulmonary disease, unspecified COPD type [J44.9] Patient Active Problem List   Diagnosis Date Noted   Chronic respiratory failure with hypoxia 01/28/2023   Palliative care encounter 01/16/2023   Pathological fracture of right femur 01/16/2023   Hypokalemia 01/15/2023   Hypomagnesemia 01/13/2023   Anxiety 01/12/2023   Peripheral neuropathy 01/12/2023   Generalized abdominal pain 01/12/2023   Hyponatremia 01/12/2023   Abnormal urinalysis 01/12/2023   COPD exacerbation 01/11/2023   Impending pathologic fracture 01/01/2023   Insomnia 07/19/2022   Dysuria 06/22/2022   Malignant neoplasm metastatic to bone 07/28/2021   Normocytic anemia 01/09/2021   Odynophagia 01/09/2021   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   Encounter for antineoplastic chemotherapy 11/25/2020   Tachycardia 11/25/2020   Neoplasm related pain 11/25/2020   Primary malignant neoplasm of lung metastatic to other site 11/04/2020   Metastatic lung cancer (metastasis from lung to other site) 11/02/2020   Weight loss 10/28/2020   Goals of care, counseling/discussion 10/28/2020   Malnutrition of moderate degree 10/22/2020   Pleural effusion 10/20/2020    Malignant pleural effusion    COPD with acute exacerbation    Dizziness    Metastatic malignant neoplasm    Shortness of breath    S/P thoracentesis    Lung mass    PCP:  Pcp, No Pharmacy:   [facility] [address]), [place] - [address] [place]) [place] [address] Phone: [phone] Fax: [phone]  [facility] [place], [place] - [address] [place] [address] Phone: [phone] Fax: [phone]     Social Determinants of Health (SDOH) Social History: SDOH Screenings   Food Insecurity: No Food Insecurity (01/12/2023)  Housing: Low Risk  (01/12/2023)  Transportation Needs: No Transportation Needs (01/12/2023)  Utilities: Not At Risk (01/12/2023)  Alcohol Screen: Low Risk  (02/19/2023)  Depression (PHQ2-9): Low Risk  (02/19/2023)  Financial Resource Strain: Low Risk  (02/19/2023)  Physical Activity: Inactive (02/19/2023)  Social Connections: Moderately Integrated (02/19/2023)  Stress: Stress Concern Present (02/19/2023)  Tobacco Use: Medium Risk (03/21/2023)   SDOH Interventions:     Readmission Risk Interventions    01/14/2023   10:20  AM  Readmission Risk Prevention Plan  Transportation Screening Complete  PCP or Specialist Appt within 3-5 Days Complete  Social Work Consult for Recovery Care Planning/Counseling Complete  Palliative Care Screening Not Applicable  Medication Review Oceanographer) Complete

## 2023-03-28 ENCOUNTER — Encounter: Payer: Self-pay | Admitting: Oncology

## 2023-03-28 ENCOUNTER — Inpatient Hospital Stay: Payer: Medicare Other

## 2023-03-28 DIAGNOSIS — E43 Unspecified severe protein-calorie malnutrition: Secondary | ICD-10-CM | POA: Insufficient documentation

## 2023-03-28 DIAGNOSIS — J441 Chronic obstructive pulmonary disease with (acute) exacerbation: Secondary | ICD-10-CM | POA: Diagnosis not present

## 2023-03-28 LAB — CBC WITH DIFFERENTIAL/PLATELET
Abs Immature Granulocytes: 0.03 10*3/uL (ref 0.00–0.07)
Basophils Absolute: 0 10*3/uL (ref 0.0–0.1)
Basophils Relative: 0 %
Eosinophils Absolute: 0 10*3/uL (ref 0.0–0.5)
Eosinophils Relative: 0 %
HCT: 30.6 % — ABNORMAL LOW (ref 36.0–46.0)
Hemoglobin: 9.3 g/dL — ABNORMAL LOW (ref 12.0–15.0)
Immature Granulocytes: 0 %
Lymphocytes Relative: 4 %
Lymphs Abs: 0.4 10*3/uL — ABNORMAL LOW (ref 0.7–4.0)
MCH: 26.6 pg (ref 26.0–34.0)
MCHC: 30.4 g/dL (ref 30.0–36.0)
MCV: 87.7 fL (ref 80.0–100.0)
Monocytes Absolute: 1.3 10*3/uL — ABNORMAL HIGH (ref 0.1–1.0)
Monocytes Relative: 12 %
Neutro Abs: 8.6 10*3/uL — ABNORMAL HIGH (ref 1.7–7.7)
Neutrophils Relative %: 84 %
Platelets: 206 10*3/uL (ref 150–400)
RBC: 3.49 MIL/uL — ABNORMAL LOW (ref 3.87–5.11)
RDW: 14.6 % (ref 11.5–15.5)
WBC: 10.2 10*3/uL (ref 4.0–10.5)
nRBC: 0 % (ref 0.0–0.2)

## 2023-03-28 LAB — BASIC METABOLIC PANEL
Anion gap: 8 (ref 5–15)
BUN: 17 mg/dL (ref 8–23)
CO2: 35 mmol/L — ABNORMAL HIGH (ref 22–32)
Calcium: 8.6 mg/dL — ABNORMAL LOW (ref 8.9–10.3)
Chloride: 93 mmol/L — ABNORMAL LOW (ref 98–111)
Creatinine, Ser: 0.38 mg/dL — ABNORMAL LOW (ref 0.44–1.00)
GFR, Estimated: >60 mL/min (ref >60)
Glucose, Bld: 92 mg/dL (ref 70–99)
Potassium: 3.7 mmol/L (ref 3.5–5.1)
Sodium: 136 mmol/L (ref 135–145)

## 2023-03-28 NOTE — Progress Notes (Signed)
Upon entering the patients room, patient was found to be in a mess with items from the over-bed table knocked off onto the floor and urine all over the floor. Patient stated she did not fall but she was trying to get to the restroom and didn't make it in time. Head to toe assessment revealed no observable s/s of injury. Patient is limping with the right leg, writer is uncertain if this limp is of new onset or patients baseline. Patient is confused at baseline. No complaints of pain or injury noted. Patient assisted from the toilet back to bed with two staff assist and a rolling walker. Patient's daughter is now at the bedside and patient is being moved closer to the nurses station for increased observation. MD Aware.   Cornell Barman Aaidyn San

## 2023-03-28 NOTE — Progress Notes (Signed)
Progress Note   Patient: Brittney Fisher UJW:[id number] DOB: Aug 10, 1961 DOA: 03/26/2023     1 DOS: the patient was seen and examined on 03/28/2023    Subjective:  I did see patient's this morning in the presence of the nurse Requiring 2 L of intranasal oxygen Aspiratory function at [facility] slightly better today Denied any chest pain, nausea vomiting or abdominal pain    Brief hospital course: Brittney Fisher is a 62 y.o. 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 Brittney Fisher states that she has been experiencing shortness of breath chronically but has gradually worsened and especially became suddenly worse on the day of presentation. She is currently being managed for acute exacerbation of COPD   Assessment and Plan:  COPD exacerbation Patient has a history of clinically diagnosed COPD in the setting of extensive tobacco use previously.  She endorses gradually worsening shortness of breath that has significantly worsened today.  She has required increased use of her home inhalers and several nebulizer treatments on arrival.  No cough or focal opacities on imaging to suggest bacterial pneumonia.   - S/p Solu-Medrol 125 mg once - Continue prednisone 40 mg to complete a 5-day course - Resume COVID test negative - DuoNebs every 6 hours - Continue home bronchodilators - PT/OT   Limping-right hip x-ray requested  Chronic respiratory failure with hypoxia Patient has been able to be weaned back to her home 2 L of supplemental oxygen. - Continue supplemental oxygen to maintain oxygen saturation above 88%     Tachycardia Chronic and unchanged.  Likely due to progressive debility in the setting of metastatic cancer.   Hyponatremia Stable and mild at this time.  History of SIADH, however  recent oncology note states hyponatremia likely due to dehydration.   - S/p 1 L IV fluids - Dietitian consulted   Primary malignant neoplasm of lung metastatic to other site Patient has a history of stage IV adenocarcinoma of the lung with metastasis to the bone and pleura.  She she has completed first and second line treatment without success.   Per oncology note, discussing potential third line treatment.   Goals of care, counseling/discussion Palliative care consulted by admitting provider     Sacral decubitus ulcers Stage III Wound care consulted   Normocytic anemia Stable and unchanged at this time.  Likely secondary to bony metastasis and chemotherapy.   Advance Care Planning:   Code Status: DNR/DNI.   Family Communication: No family at bedside   Severity of Illness: The appropriate patient statu     Data Reviewed: Have reviewed patient's CBC as well as BMP results     Time spent: 40 minutes        Physical Exam:   Vitals and nursing note reviewed.  Constitutional:      Appearance: She is cachectic.  HENT:     Head: Normocephalic and atraumatic.  Cardiovascular:     Rate and Rhythm: Regular rhythm. Tachycardia present.     Heart sounds: No murmur heard. Pulmonary: Tachypneic with use of accessory muscle Abdominal:     Palpations: Abdomen is soft.     Tenderness: There is no abdominal tenderness.  Musculoskeletal:     Cervical back: Neck supple.     Right lower leg: No tenderness. No edema.     Left  lower leg: No tenderness. No edema.  Skin:    General: Skin is warm and dry.     Comments: Stage 2-3 decubitus ulcer on the sacrum, approximately 3 cm       Vitals:   03/28/23 0056 03/28/23 0457 03/28/23 0745 03/28/23 1018  BP: 104/67     Pulse: (!) 119  (!) 119   Resp: 20  18   Temp: 97.8 F (36.6 C)     TempSrc: Oral     SpO2: 97%  97% 98%  Weight:  52.5 kg      Author: Loyce Dys, MD 03/28/2023 3:41 PM  For on call review  [email or web address].

## 2023-03-28 NOTE — Progress Notes (Signed)
Physical Therapy Treatment Patient Details Name: Brittney Fisher MRN: [id number] DOB: [birth year] Today's Date: 03/28/2023   History of Present Illness Brittney Fisher is a 62 y.o. 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    PT Comments    Patient received in bed, mobility specialist in room. She continues to be limited by sob, pain in R LE and anxiety with movement. Patient has poor understanding of health. She wants to go home, she is able to pivot to recliner but is unable to progress to ambulation due to sob and anxiety. She is declining wheelchair which I am recommending for improved mobility and safety. She will continue to benefit from skilled PT to improve safety, strength and independence.        Recommendations for follow up therapy are one component of a multi-disciplinary discharge planning process, led by the attending physician.  Recommendations may be updated based on patient status, additional functional criteria and insurance authorization.  Follow Up Recommendations       Assistance Recommended at Discharge Intermittent Supervision/Assistance  Patient can return home with the following A little help with bathing/dressing/bathroom;Assistance with cooking/housework;Assist for transportation;Help with stairs or ramp for entrance;A lot of help with walking and/or transfers   Equipment Recommendations  Wheelchair (measurements PT);Wheelchair cushion (measurements PT) (patient does not want)    Recommendations for Other Services       Precautions / Restrictions Precautions Precautions: Fall Restrictions Weight Bearing Restrictions: No     Mobility  Bed Mobility Overal bed mobility: Modified Independent Bed Mobility: Supine to Sit     Supine to sit: Modified  independent (Device/Increase time)     General bed mobility comments: Uses B UE to move right LE off bed due to pain    Transfers Overall transfer level: Needs assistance Equipment used: Rolling walker (2 wheels) Transfers: Sit to/from Stand, Bed to chair/wheelchair/BSC Sit to Stand: Min assist Stand pivot transfers: Min guard         General transfer comment: Patient able to stand with cues using RW. She then let go of walker and reached for recliner to just pivot into chair. Poor safety awareness. Limited by SOB, anxiety and R Hip pain.    Ambulation/Gait               General Gait Details: unable at this time   Stairs             Wheelchair Mobility    Modified Rankin (Stroke Patients Only)       Balance Overall balance assessment: Needs assistance Sitting-balance support: Feet supported Sitting balance-Leahy Scale: Fair     Standing balance support: Bilateral upper extremity supported, During functional activity, Reliant on assistive device for balance Standing balance-Leahy Scale: Poor                              Cognition Arousal/Alertness: Awake/alert Behavior During Therapy: Anxious Overall Cognitive Status: Within Functional Limits for tasks assessed                                 General Comments: patient with poor overall health awareness. Wants someone to "fix" her breathing  Exercises      General Comments        Pertinent Vitals/Pain Pain Assessment Pain Assessment: Faces Faces Pain Scale: Hurts even more Pain Location: R LE, reports she had hip surgery in January. Pain Descriptors / Indicators: Grimacing, Guarding, Discomfort, Sore Pain Intervention(s): Monitored during session, Repositioned    Home Living                          Prior Function            PT Goals (current goals can now be found in the care plan section) Acute Rehab PT Goals Patient Stated Goal: to  breathe better PT Goal Formulation: With patient Time For Goal Achievement: 04/10/23 Potential to Achieve Goals: Fair Progress towards PT goals: Not progressing toward goals - comment (continues to be limited by sob, anxiety, pain)    Frequency    Min 3X/week      PT Plan Current plan remains appropriate    Co-evaluation              AM-PAC PT "6 Clicks" Mobility   Outcome Measure  Help needed turning from your back to your side while in a flat bed without using bedrails?: None Help needed moving from lying on your back to sitting on the side of a flat bed without using bedrails?: None Help needed moving to and from a bed to a chair (including a wheelchair)?: A Little Help needed standing up from a chair using your arms (e.g., wheelchair or bedside chair)?: A Little Help needed to walk in hospital room?: A Lot Help needed climbing 3-5 steps with a railing? : Total 6 Click Score: 17    End of Session Equipment Utilized During Treatment: Oxygen Activity Tolerance: Other (comment);Patient limited by pain (anxiety, sob) Patient left: in chair;with chair alarm set Nurse Communication: Mobility status PT Visit Diagnosis: Other abnormalities of gait and mobility (R26.89);Muscle weakness (generalized) (M62.81);Difficulty in walking, not elsewhere classified (R26.2);Pain Pain - Right/Left: Right Pain - part of body: Leg     Time: 3202-3343 PT Time Calculation (min) (ACUTE ONLY): 19 min  Charges:  $Therapeutic Activity: 8-22 mins                     Bailen Geffre, PT, GCS 03/28/23,10:26 AM

## 2023-03-28 NOTE — Progress Notes (Signed)
Patient and belongings moved to a room closer to the nurses station.   Brittney Fisher

## 2023-03-28 NOTE — Care Management Important Message (Signed)
Important Message  Patient Details  Name: Brittney Fisher MRN: [id number] Date of Birth: [birth year]   Medicare Important Message Given:  N/A - LOS <3 / Initial given by admissions     Johnell Comings 03/28/2023, 2:24 PM

## 2023-03-28 NOTE — TOC Initial Note (Signed)
Transition of Care [facility]) - Initial/Assessment Note    Patient Details  Name: Brittney Fisher MRN: [id number] Date of Birth: 1961/10/05  [facility] CM/SW Contact:    Chapman Fitch, RN Phone Number: 03/28/2023, 9:23 AM  Clinical Narrative:                      Admitted for: COPD Admitted from: home with mother and sister PCP: not listed. Patient agreeable to new patient appointment.  Set up with [facility] 4/22.  See AVS Current home health/prior home health/DME: RW  Notified Barbara Cower with [facility] of new PCP Patient states niece will transport at discharge and will bring portable O2 Patient Goals and CMS Choice            Expected Discharge Plan and Services                                              Prior Living Arrangements/Services                       Activities of Daily Living      Permission Sought/Granted                  Emotional Assessment              Admission diagnosis:  COPD exacerbation [J44.1] Dyspnea, unspecified type [R06.00] Chronic obstructive pulmonary disease, unspecified COPD type [J44.9] Patient Active Problem List   Diagnosis Date Noted   Chronic respiratory failure with hypoxia 01/28/2023   Palliative care encounter 01/16/2023   Pathological fracture of right femur 01/16/2023   Hypokalemia 01/15/2023   Hypomagnesemia 01/13/2023   Anxiety 01/12/2023   Peripheral neuropathy 01/12/2023   Generalized abdominal pain 01/12/2023   Hyponatremia 01/12/2023   Abnormal urinalysis 01/12/2023   COPD exacerbation 01/11/2023   Impending pathologic fracture 01/01/2023   Insomnia 07/19/2022   Dysuria 06/22/2022   Malignant neoplasm metastatic to bone 07/28/2021   Normocytic anemia 01/09/2021   Odynophagia 01/09/2021   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   Encounter for antineoplastic chemotherapy 11/25/2020   Tachycardia 11/25/2020   Neoplasm related pain 11/25/2020   Primary  malignant neoplasm of lung metastatic to other site 11/04/2020   Metastatic lung cancer (metastasis from lung to other site) 11/02/2020   Weight loss 10/28/2020   Goals of care, counseling/discussion 10/28/2020   Malnutrition of moderate degree 10/22/2020   Pleural effusion 10/20/2020   Malignant pleural effusion    COPD with acute exacerbation    Dizziness    Metastatic malignant neoplasm    Shortness of breath    S/P thoracentesis    Lung mass    PCP:  Pcp, No Pharmacy:   [facility] [address] ([address]),  - [address] [place] ([place]) [place] [address] Phone: [phone] Fax: [phone]  [facility] [place], [place] - [address] [place] [address] Phone: [phone] Fax: [phone]     Social Determinants of Health (SDOH) Social History: SDOH Screenings   Food Insecurity: No Food Insecurity (01/12/2023)  Housing: Low Risk  (01/12/2023)  Transportation Needs: No Transportation Needs (01/12/2023)  Utilities: Not At Risk (01/12/2023)  Alcohol Screen: Low Risk  (02/19/2023)  Depression (PHQ2-9): Low Risk  (02/19/2023)  Financial Resource Strain: Low Risk  (  02/19/2023)  Physical Activity: Inactive (02/19/2023)  Social Connections: Moderately Integrated (02/19/2023)  Stress: Stress Concern Present (02/19/2023)  Tobacco Use: Medium Risk (03/21/2023)   SDOH Interventions:     Readmission Risk Interventions    03/28/2023    9:22 AM 01/14/2023   10:20 AM  Readmission Risk Prevention Plan  Transportation Screening Complete Complete  PCP or Specialist Appt within 3-5 Days  Complete  Social Work Consult for Recovery Care Planning/Counseling Complete Complete  Palliative Care Screening Not Applicable Not Applicable  Medication Review Oceanographer) Complete Complete

## 2023-03-29 ENCOUNTER — Inpatient Hospital Stay: Payer: Medicare Other | Admitting: Hospice and Palliative Medicine

## 2023-03-29 DIAGNOSIS — J441 Chronic obstructive pulmonary disease with (acute) exacerbation: Secondary | ICD-10-CM | POA: Diagnosis not present

## 2023-03-29 DIAGNOSIS — Z515 Encounter for palliative care: Secondary | ICD-10-CM

## 2023-03-29 LAB — BASIC METABOLIC PANEL
Anion gap: 8 (ref 5–15)
BUN: 19 mg/dL (ref 8–23)
CO2: 35 mmol/L — ABNORMAL HIGH (ref 22–32)
Calcium: 8.5 mg/dL — ABNORMAL LOW (ref 8.9–10.3)
Chloride: 89 mmol/L — ABNORMAL LOW (ref 98–111)
Creatinine, Ser: 0.35 mg/dL — ABNORMAL LOW (ref 0.44–1.00)
GFR, Estimated: >60 mL/min (ref >60)
Glucose, Bld: 102 mg/dL — ABNORMAL HIGH (ref 70–99)
Potassium: 3.9 mmol/L (ref 3.5–5.1)
Sodium: 132 mmol/L — ABNORMAL LOW (ref 135–145)

## 2023-03-29 LAB — CBC WITH DIFFERENTIAL/PLATELET
Abs Immature Granulocytes: 0.04 10*3/uL (ref 0.00–0.07)
Basophils Absolute: 0 10*3/uL (ref 0.0–0.1)
Basophils Relative: 0 %
Eosinophils Absolute: 0 10*3/uL (ref 0.0–0.5)
Eosinophils Relative: 0 %
HCT: 29.5 % — ABNORMAL LOW (ref 36.0–46.0)
Hemoglobin: 9.1 g/dL — ABNORMAL LOW (ref 12.0–15.0)
Immature Granulocytes: 1 %
Lymphocytes Relative: 4 %
Lymphs Abs: 0.4 10*3/uL — ABNORMAL LOW (ref 0.7–4.0)
MCH: 27.2 pg (ref 26.0–34.0)
MCHC: 30.8 g/dL (ref 30.0–36.0)
MCV: 88.3 fL (ref 80.0–100.0)
Monocytes Absolute: 1.2 10*3/uL — ABNORMAL HIGH (ref 0.1–1.0)
Monocytes Relative: 13 %
Neutro Abs: 7.2 10*3/uL (ref 1.7–7.7)
Neutrophils Relative %: 82 %
Platelets: 180 10*3/uL (ref 150–400)
RBC: 3.34 MIL/uL — ABNORMAL LOW (ref 3.87–5.11)
RDW: 14.6 % (ref 11.5–15.5)
WBC: 8.8 10*3/uL (ref 4.0–10.5)
nRBC: 0 % (ref 0.0–0.2)

## 2023-03-29 MED ORDER — MORPHINE SULFATE (PF) 2 MG/ML IV SOLN: 1.0000 mg | INTRAVENOUS | Status: DC | PRN

## 2023-03-29 MED ORDER — OLANZAPINE 5 MG PO TABS
5.0000 mg | ORAL_TABLET | Freq: Every day | ORAL | Status: DC
  Administered 2023-03-29: 5 mg via ORAL
  Filled 2023-03-29: qty 1

## 2023-03-29 NOTE — Consult Note (Signed)
Palliative Medicine [facility] at [facility] [phone] [phone]   Name: Brittney Fisher Date: 03/29/2023 MRN: [id number]  DOB: 15-Mar-1961  Patient Care Team: Pcp, No as PCP - General Brittney Buff, RN as Oncology Nurse Navigator Brittney Patience, MD as Medical Oncologist (Oncology) Brittney Saner, MD as Consulting Physician (Pulmonary Disease)    REASON FOR CONSULTATION: Brittney Fisher is a 62 y.o. 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  Patient with recent disease progression with plan for possible rotation to third line chemotherapy.  However, patient was admitted to the hospital with respiratory failure and presumed COPD exacerbation.  She has remained delirious and is overall decline.  Palliative care was consulted to address goals.  SOCIAL HISTORY:     reports that she quit smoking about 18 months ago. Her smoking use included cigarettes. She has a 40.00 pack-year smoking history. She has never used smokeless tobacco. She reports that she does not drink alcohol and does not use drugs.  Patient is widowed.  She has no children.  She lives at home with a niece.  She has another niece and sister who are involved in her care.  Patient previously worked at [employer].   ADVANCE DIRECTIVES:  Patient's niece, Brittney Fisher, is her healthcare power of attorney.  CODE STATUS: DNR/DNI  PAST MEDICAL HISTORY: Past Medical History:  Diagnosis Date   COPD (chronic obstructive pulmonary disease)    GERD (gastroesophageal reflux disease)    Metastatic lung cancer (metastasis from lung to other site) 11/02/2020    PAST SURGICAL HISTORY:  Past Surgical History:  Procedure Laterality Date   COLONOSCOPY WITH ESOPHAGOGASTRODUODENOSCOPY (EGD)     COLONOSCOPY WITH PROPOFOL N/A 11/14/2015   Procedure:  COLONOSCOPY WITH PROPOFOL;  Surgeon: Wallace Cullens, MD;  Location: [facility] ENDOSCOPY;  Service: Gastroenterology;  Laterality: N/A;   ESOPHAGOGASTRODUODENOSCOPY (EGD) WITH PROPOFOL N/A 11/14/2015   Procedure: ESOPHAGOGASTRODUODENOSCOPY (EGD) WITH PROPOFOL;  Surgeon: Wallace Cullens, MD;  Location: [facility] ENDOSCOPY;  Service: Gastroenterology;  Laterality: N/A;   FEMUR IM NAIL Right 01/01/2023   Procedure: TREATMENT, INTERTROCHANTERIC FEMORAL FRACTURE, WITH INTRAMEDULLARY IMPLANT (SURG);  Surgeon: Lyndle Herrlich, MD;  Location: [facility] ORS;  Service: Orthopedics;  Laterality: Right;    HEMATOLOGY/ONCOLOGY HISTORY:  Oncology History  Metastatic lung cancer (metastasis from lung to other site)  11/02/2020 Initial Diagnosis   Metastatic lung cancer (metastasis from lung to other site) [facility])   11/18/2020 - 09/29/2021 Chemotherapy   Patient is on Treatment Plan : LUNG PEMEtrexed (Alimta) + CARBOplatin + Bevacizumab q21d x 1 cycle      03/20/2022 Imaging   CT chest abdomen pelvis w contrast 1. Unchanged circumferential pleural thickening and pleural nodularity of the right hemithorax, left infrahilar mass, and bilateral pulmonary nodules. 2. Stable right perihilar consolidation, likely post radiation changes. 3. Unchanged numerous sclerotic osseous lesions.4.  Aortic Atherosclerosis (ICD10-I70.0).    06/08/2022 Imaging   MRI lumbar spine w and wo 1. Unchanged appearance of multiple sclerotic lesions involving all lumbar levels, consistent with osseous metastatic disease.2. Severe right L5-S1 neural foraminal stenosis.3. Mild right L4-5 neural foraminal stenosis  MR Sacrum SI Joints w wo contrast 1. Multiple sclerotic lesions of sacrum and bilateral ilia with enhancement on post contrast sequences consistent with metastatic disease.2.  No evidence of sacroiliitis or joint effusion. 3.  No significant finding of the neurovascular  bundle bilaterally     Imaging     03/21/2023 -  Chemotherapy   Patient is on Treatment  Plan : LUNG NSCLC Atezolizumab q21d     04/24/2023 - 04/24/2023 Chemotherapy   Patient is on Treatment Plan : LUNG NSCLC Atezolizumab q21d     Primary malignant neoplasm of lung metastatic to other site  11/04/2020 Initial Diagnosis   Primary malignant neoplasm of lung metastatic to other site [facility])  10/20/2020, patient was brought to ED via EMS due to generalized weakness, dizziness, chest pain shortness of breath.  She was recently diagnosed with COPD approximately 1 month ago by primary care provider.  Also unintentional weight loss during the last few months. Image work-up showed right-sided pleural effusion.  She underwent right thoracentesis.  With removal of 2.4 L of fluid. 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  Metastatic disease to low cervical/thoracic nodes, lung, right pleural space and bones.  Moderate to large right pleural effusion.  SVC narrowing. 10/21/2020 MRI brain is negative for metastasis.  Mild chronic microvascular ischemic changes in the white matter.  Negative for acute infarct Regarding to the SVC narrowing, patient was seen by vascular surgeon and was recommended no intervention inpatient.  Patient to follow-up outpatient with vascular surgeon for evaluation. Patient also was treated for COPD exacerbation with hypoxia on exertion. Qualifies for home oxygen and hospitalist arrange home health and a nebulizer.  Patient was given a course of prednisone taper and empiric Levaquin. Patient was discharged and present today to follow-up with cytology results and further management plan   10/27/2020 PET scan showed right upper lobe primary bronchogenic mass with direct invasion into mediastinum. Metastatic disease to the right pleural space, lung, bone, nodes of the chest and less so lower leg/upper abdomen. Hypermetabolic him along the course of the right axillary vein with concurrent  subtle hyper attenuation within the axillary vein and SVC.  This continues to the level of SVC narrowing.  Suspicious for SVC occlusion and developing thrombus. Moderate right pleural effusion and small pericardial effusion.   # # left supraclavicular mass biopsy- pathology is positive for adenocarcinoma. positive for CK7, with patchy, weak CK20.  They are  negative for TTF1, NapsinA, GATA3, CDX2, and Pax8.  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  #NGS showed AXIN 1 [id number], DIS2 M147fs, KRAS G12C, PRDM1 M1 [id number]*, [id number], MS stable, TPS <1%      11/04/2020 Cancer Staging   Staging form: Lung, AJCC 8th Edition - Clinical stage from 11/04/2020: Stage IV (cT4, cN3, cM1) - Signed by Brittney Patience, MD on 11/04/2020   11/14/2020 - 12/12/2020 Radiation Therapy   #Bronchial obstruction and SVC occlusion.  s/p palliative radiation   11/18/2020 - 03/22/2022 Chemotherapy   carboplatin/Alimta/bevacizumab x 6 cycles   Infusion reaction of carboplatin during cycle 6.  Carboplatin discontinued.   04/07/2021 Imaging   CT chest abdomen pelvis-partial response Mild decrease in size of right lung mass, mildly decreased mediastinal and hilar adenopathy.  Extensive right-sided pleural metastasis unchanged.  Similar appearance of diffuse bilateral pulmonary nodules.  Multifocal lytic sclerotic bone metastasis.  New superior endplate deformity of T6   04/12/2021 - 09/29/2021 Chemotherapy   04/12/2021 - 09/08/2021 bevacizumab and Alimta.  09/29/2021 extra cycle of maintenance bevacizumab and Alimta  while waiting for approval of Lumakras   09/21/2021 Imaging    bone scan showed multifocal abnormal uptake including mid to distal shaft of right femur, left femoral neck and trochanter, left sacroiliac region, left  ischium.  Focal activity at the proximal tibia on the left.  Multi focal heterogeneous bilateral rib activity.  Focal activity at the sternum.   09/27/2021 Imaging   CT chest abdomen pelvis with contrast showed progression of infrahilar left lower lobe with a concerning clearly progressive masslike consolidation opacity.  Mild progression of small left pleural effusion.  Similar experience of extensive irregular right-sided pleural disease.  Similar experience of bony metastasis.    10/20/2021 -  Chemotherapy   Started on Lumakras   01/09/2022 Imaging   CT chest abdomen pelvis with contrast showed no substantial interval changes in exam.  Stable disease   03/20/2022 -  Chemotherapy   CT chest abdomen pelvis with contrast showed unchanged circumferential pleural thickening/pleural nodularity of the right hemithorax, left infrahilar mass and bilateral pulmonary nodules.  Stable right perihilar consolidation likely postradiation changes.  Unchanged numerous sclerotic osseous lesions.  Aortic atherosclerosis.     06/08/2022 Imaging   MRI sacrum SI joint w wo contrast Showed 1. Multiple sclerotic lesions of sacrum and bilateral ilia with enhancement on post contrast sequences consistent with metastatic disease. 2.  No evidence of sacroiliitis or joint effusion.3.  No significant finding of the neurovascular bundle bilaterally    06/08/2022 Imaging   MRI lumbar spine w wo contrast 1. Unchanged appearance of multiple sclerotic lesions involving all lumbar levels, consistent with osseous metastatic disease.2. Severe right L5-S1 neural foraminal stenosis.3. Mild right L4-5 neural foraminal stenosis    07/26/2022 Imaging   CT chest abdomen pelvis with contrast 1. Redemonstrated post treatment appearance of the chest with masslike consolidation and fibrosis of the perihilar right lung , mass of the superior segment left lower lobe, as well as extensive bilateral pleural thickening and nodularity, greater on  the right, and numerous bilateral pulmonary nodules. 2. Increased nodularity about the right lung base, consistent with worsened pleural metastatic disease. Other nodules unchanged. 3. Unchanged post treatment appearance of mediastinal and right hilar lymph nodes as well as epicardial lymph nodes about the left ventricular apex. 4. Slightly enlarged lytic osseous metastatic lesion of the base of the right femoral neck measuring 2.4 x 2.2 cm, previously 2.2 x 1.8 cm. This is potentially at risk for pathologic fracture given location.5. Otherwise unchanged widespread sclerotic osseous metastatic disease throughout the axial and proximal appendicular skeleton. 6. No evidence of soft tissue metastatic disease to the abdomen or pelvis. 6. No evidence of soft tissue metastatic disease to the abdomen or pelvis.   12/24/2022 Imaging   CT chest abdomen pelvis w contrast  CHEST IMPRESSION:   1. Bilateral peripheral pleural nodules are mildly increased in size. 2. Bilateral small effusions. Stable consolidation at the LEFT lung Base  3. Stable ill-defined tissue in the mediastinum suggesting treated tumor infiltrate.   PELVIS IMPRESSION:   1. No evidence of metastatic disease in the abdomen pelvis. 2. Stable sclerotic metastasis in the spine and pelvis. 3. Stable lytic lesion in the RIGHT femoral neck may be at risk for pathologic fracture. 4.  Aortic Atherosclerosis (ICD10-I70.0)     ALLERGIES:  is allergic to carboplatin.  MEDICATIONS:  Current Facility-Administered Medications  Medication Dose Route Frequency Provider Last Rate Last Admin   acetaminophen (TYLENOL) tablet 650 mg  650 mg Oral Q6H PRN Loyce Dys, MD  650 mg at 03/27/23 1710   albuterol (VENTOLIN HFA) 108 (90 Base) MCG/ACT inhaler 2 puff  2 puff Inhalation Q6H PRN Mansy, Jan A, MD   2 puff at 03/28/23 2215   ascorbic acid (VITAMIN C) tablet 500 mg  500 mg Oral BID Rosezetta Schlatter T, MD   500 mg at 03/29/23 1047   benzonatate  (TESSALON) capsule 100 mg  100 mg Oral TID PRN Verdene Lennert, MD   100 mg at 03/27/23 1710   enoxaparin (LOVENOX) injection 40 mg  40 mg Subcutaneous Q24H Verdene Lennert, MD   40 mg at 03/28/23 1735   feeding supplement (ENSURE ENLIVE / ENSURE PLUS) liquid 237 mL  237 mL Oral TID BM Loyce Dys, MD   237 mL at 03/28/23 2059   gabapentin (NEURONTIN) capsule 100 mg  100 mg Oral TID Verdene Lennert, MD   100 mg at 03/29/23 1047   ipratropium-albuterol (DUONEB) 0.5-2.5 (3) MG/3ML nebulizer solution 3 mL  3 mL Nebulization BID Rosezetta Schlatter T, MD   3 mL at 03/29/23 0720   LORazepam (ATIVAN) tablet 0.5 mg  0.5 mg Oral Q8H PRN Verdene Lennert, MD   0.5 mg at 03/29/23 1155   mometasone-formoterol (DULERA) 200-5 MCG/ACT inhaler 2 puff  2 puff Inhalation BID Verdene Lennert, MD   2 puff at 03/29/23 0800   multivitamin with minerals tablet 1 tablet  1 tablet Oral Daily Loyce Dys, MD   1 tablet at 03/29/23 1047   OLANZapine (ZYPREXA) tablet 5 mg  5 mg Oral QHS Rosezetta Schlatter T, MD       oxyCODONE (Oxy IR/ROXICODONE) immediate release tablet 5-10 mg  5-10 mg Oral Q6H PRN Verdene Lennert, MD   10 mg at 03/28/23 0025   oxyCODONE (OXYCONTIN) 12 hr tablet 10 mg  10 mg Oral Q12H Verdene Lennert, MD   10 mg at 03/29/23 1047   predniSONE (DELTASONE) tablet 40 mg  40 mg Oral Q breakfast Verdene Lennert, MD   40 mg at 03/29/23 1047   senna-docusate (Senokot-S) tablet 1 tablet  1 tablet Oral Daily Verdene Lennert, MD   1 tablet at 03/28/23 0904    VITAL SIGNS: BP 101/68 (BP Location: Right Arm)   Pulse (!) 118 Comment: primary RN notified  Temp 98.1 F (36.7 C)   Resp 12   Wt 113 lb 12.1 oz (51.6 kg)   SpO2 100%   BMI 15.87 kg/m  Filed Weights   03/27/23 1550 03/28/23 0457 03/29/23 0557  Weight: 115 lb 12.8 oz (52.5 kg) 115 lb 12.8 oz (52.5 kg) 113 lb 12.1 oz (51.6 kg)    Estimated body mass index is 15.87 kg/m as calculated from the following:   Height as of 03/21/23: 5\' 11"  (1.803 m).   Weight as of  this encounter: 113 lb 12.1 oz (51.6 kg).  LABS: CBC:    Component Value Date/Time   WBC 8.8 03/29/2023 0550   HGB 9.1 (L) 03/29/2023 0550   HGB 11.6 (L) 03/21/2023 1038   HCT 29.5 (L) 03/29/2023 0550   PLT 180 03/29/2023 0550   PLT 188 03/21/2023 1038   MCV 88.3 03/29/2023 0550   NEUTROABS 7.2 03/29/2023 0550   LYMPHSABS 0.4 (L) 03/29/2023 0550   MONOABS 1.2 (H) 03/29/2023 0550   EOSABS 0.0 03/29/2023 0550   BASOSABS 0.0 03/29/2023 0550   Comprehensive Metabolic Panel:    Component Value Date/Time   NA 132 (L) 03/29/2023 0550   K 3.9 03/29/2023 0550   CL  89 (L) 03/29/2023 0550   CO2 35 (H) 03/29/2023 0550   BUN 19 03/29/2023 0550   CREATININE 0.35 (L) 03/29/2023 0550   CREATININE 0.36 (L) 03/21/2023 1038   GLUCOSE 102 (H) 03/29/2023 0550   CALCIUM 8.5 (L) 03/29/2023 0550   AST 20 03/26/2023 1027   AST 22 03/21/2023 1038   ALT 13 03/26/2023 1027   ALT 13 03/21/2023 1038   ALKPHOS 105 03/26/2023 1027   BILITOT 0.5 03/26/2023 1027   BILITOT 0.4 03/21/2023 1038   PROT 6.1 (L) 03/26/2023 1027   ALBUMIN 2.4 (L) 03/26/2023 1027    RADIOGRAPHIC STUDIES: DG HIP UNILAT WITH PELVIS 2-3 VIEWS RIGHT  Result Date: 03/28/2023 CLINICAL DATA:  Limp following recent surgery EXAM: DG HIP (WITH OR WITHOUT PELVIS) 2V RIGHT COMPARISON:  CT abdomen and pelvis dated 03/08/2023 FINDINGS: Partially imaged right femoral fixation hardware appears intact. No abnormal surrounding lucency. The femoroacetabular joint is intact. Scattered multifocal sclerotic lesions, grossly similar to 03/08/2023. No acute fracture or dislocation. IMPRESSION: 1. Partially imaged right femoral fixation hardware appears intact. No abnormal surrounding lucency. 2. Scattered multifocal sclerotic lesions, grossly similar to 03/08/2023. Electronically Signed   By: Agustin Cree M.D.   On: 03/28/2023 14:36   CT Angio Chest PE W and/or Wo Contrast  Result Date: 03/26/2023 CLINICAL DATA:  PE suspected, metastatic lung cancer *  Tracking Code: BO * EXAM: CT ANGIOGRAPHY CHEST WITH CONTRAST TECHNIQUE: Multidetector CT imaging of the chest was performed using the standard protocol during bolus administration of intravenous contrast. Multiplanar CT image reconstructions and MIPs were obtained to evaluate the vascular anatomy. RADIATION DOSE REDUCTION: This exam was performed according to the departmental dose-optimization program which includes automated exposure control, adjustment of the mA and/or kV according to patient size and/or use of iterative reconstruction technique. CONTRAST:  45mL OMNIPAQUE IOHEXOL 350 MG/ML SOLN COMPARISON:  CT chest abdomen pelvis, 03/08/2023 FINDINGS: Cardiovascular: Satisfactory opacification of the pulmonary arteries to the segmental level. No evidence of pulmonary embolism. Normal heart size. No pericardial effusion. Aortic atherosclerosis. Mediastinum/Nodes: Bulky, matted mediastinal and hilar lymphadenopathy, not significantly changed. Thyroid gland, trachea, and esophagus demonstrate no significant findings. Lungs/Pleura: Similar appearance the lungs with masslike consolidation of the right lung base, complete atelectasis or consolidation of the left lung base, and small bilateral pleural effusions with associated pleural masses and nodules. Unchanged scarring and volume loss of the right pulmonary apex (series 6, image 43). Upper Abdomen: No acute abnormality. Musculoskeletal: No chest wall abnormality. Interval increase in bony erosion of the posterior right tenth rib (series 5, image 196). Review of the MIP images confirms the above findings. IMPRESSION: 1. Negative examination for pulmonary embolism. 2. Similar appearance the lungs with masslike consolidation of the right lung base, complete atelectasis or consolidation of the left lung base, and small bilateral pleural effusions with associated pleural masses and nodules. 3. Bulky, matted mediastinal and hilar lymphadenopathy, not significantly  changed. 4. Interval increase in bony erosion of the posterior right tenth rib. 5. Findings are consistent with primary lung malignancy and metastatic disease, similar to recent prior restaging examination dated 03/08/2023. Aortic Atherosclerosis (ICD10-I70.0). Electronically Signed   By: Jearld Lesch M.D.   On: 03/26/2023 15:54   DG Chest Portable 1 View  Result Date: 03/26/2023 CLINICAL DATA:  Short of breath. EXAM: PORTABLE CHEST 1 VIEW COMPARISON:  03/02/2023 and older exams. FINDINGS: Right greater than left pleural effusions, right central, upper and lower lung opacities with distortion, left lung base opacities and bilateral ill-defined  pulmonary nodules are without significant change from the most recent prior study. No pneumothorax. Heart normal in size. Skeletal structures grossly intact. IMPRESSION: 1. No acute findings. Right lung findings consistent with known lung carcinoma, with areas of treatment related lung scarring and lung base atelectasis. Bilateral pleural effusions. Left lung base atelectasis and bilateral pulmonary nodules consistent with metastatic disease. These findings are stable from the recent prior studies. Electronically Signed   By: Amie Portland M.D.   On: 03/26/2023 10:38   CT CHEST ABDOMEN PELVIS W CONTRAST  Result Date: 03/08/2023 CLINICAL DATA:  Lung cancer restaging * Tracking Code: BO * EXAM: CT CHEST, ABDOMEN, AND PELVIS WITH CONTRAST TECHNIQUE: Multidetector CT imaging of the chest, abdomen and pelvis was performed following the standard protocol during bolus administration of intravenous contrast. RADIATION DOSE REDUCTION: This exam was performed according to the departmental dose-optimization program which includes automated exposure control, adjustment of the mA and/or kV according to patient size and/or use of iterative reconstruction technique. CONTRAST:  6mL OMNIPAQUE IOHEXOL 300 MG/ML  SOLN COMPARISON:  Multiple exams, including 01/17/2023 and 01/11/2023  FINDINGS: CT CHEST FINDINGS Cardiovascular: Aortic and branch vessel atherosclerotic vascular calcification. Mediastinum/Nodes: Stranding of mediastinal adipose tissue causing ill definition of of tumor margins in the mediastinum, with suspected tumor or confluent adenopathy in the right paratracheal, subcarinal, right hilar, and right infrahilar regions although with individual nodes not readily seen due to the ill-defined and infiltrative nature. Appearance generally similar to prior. Lungs/Pleura: Extensive consolidation and enhancement at the right lung apex, in the superior segment right lower lobe posterior to the hilum, and tracking in the right lower lobe, with extensive nodularity peripherally in the right lower lobe and right middle lobe compatible with tumor deposits. Some of the consolidation in the right paramediastinal region may be therapy related. In individual subpleural nodule in the right lower lobe measures 1.4 by 2.0 cm on image 83 series 4, previously 1.3 by 1.7 cm. An index right middle lobe subpleural nodule measures 1.4 by 1.0 cm on image 101 of series 2, previously 1.3 by 0.9 cm. A lingular nodule on image 100 series 4 measures 1.4 by 1.1 cm, previously 1.3 by 1.1 cm. Other left upper lobe nodules appear stable. Volume loss and consolidation of most of the left lower lobe with enhancing pulmonary parenchymal tissue. There is likely some tumor within this consolidated lung. Extensive pleural rind at the right lung base with some probable anterior chest wall invasion for example in the fifth and sixth intercostal spaces. Musculoskeletal: Sclerosis and periosteal reaction in the right scapula favoring metastatic lesion. There are a few sclerotic rib lesions such as in the left lateral third rib compatible with metastatic disease. Sclerotic lesion in the manubrium is stable and compatible with metastatic disease. Scattered thoracic spine vertebral metastatic lesions appear stable. CT ABDOMEN  PELVIS FINDINGS Hepatobiliary: Unremarkable Pancreas: Unremarkable Spleen: Unremarkable Adrenals/Urinary Tract: Adrenal glands appear normal. Single bilateral simple renal cysts are benign and warrant no individual follow up. Stomach/Bowel: Unremarkable Vascular/Lymphatic: Atherosclerosis is present, including aortoiliac atherosclerotic disease. Reproductive: Small calcified fundal fibroid the uterus. Other: No supplemental non-categorized findings. Musculoskeletal: Scattered sclerotic metastatic lesions in the lumbar spine, bony pelvis, and left proximal femur. Right hip screw. No significant change or progression of the osseous metastatic disease. Grade 1 degenerative anterolisthesis at L5-S1. IMPRESSION: 1. Individual lung nodules are mildly increased in size compared to 01/17/2023 compatible with mild progression. Extensive tumor rind at the right lung base with perihilar and peribronchovascular airspace opacities  in the right lung which may be from tumor or radiation therapy. Probable mild chest wall invasion in the right anterior fifth and sixth intercostal spaces. 2. Stable indistinct infiltration in the right mediastinum probably from ill-defined adenopathy. 3. Stable volume loss and likely tumor in the left lower lobe. Substantial left pleural enhancement probably from left pleural malignancy. 4. Scattered sclerotic osseous metastatic lesions in the axial and appendicular skeleton, stable. Aortic Atherosclerosis (ICD10-I70.0). Electronically Signed   By: Gaylyn Rong M.D.   On: 03/08/2023 14:10   DG Chest Portable 1 View  Result Date: 03/02/2023 CLINICAL DATA:  Shortness of breath EXAM: PORTABLE CHEST 1 VIEW COMPARISON:  01/21/2023, CT 01/17/2023, chest x-ray 12/21/2022, 10/31/2020 FINDINGS: Similar appearance of right greater than left pleural effusions. Distortion in the right apex and perihilar region as before. Bilateral pulmonary nodules corresponding to history of metastatic disease.  Scattered foci of sclerosis within the scapula corresponding to history of skeletal metastatic disease. IMPRESSION: Overall stable chest radiograph compared with 01/21/2023. Bilateral effusions, pulmonary metastatic disease and right perihilar and apical distortion do not show significant interval change. Electronically Signed   By: Jasmine Pang M.D.   On: 03/02/2023 22:46    PERFORMANCE STATUS (ECOG) : 4 - Bedbound  Review of Systems Unable to complete  Physical Exam General: Ill-appearing Pulmonary: Unlabored Extremities: no edema, no joint deformities Skin: no rashes Neurological: Confused  IMPRESSION: Patient confused and delirious and unable to engage meaningfully in a conversation regarding goals.  I met with patient's niece, Brittney Fisher, who is patient's healthcare power of attorney.  Patient has overall been declining.  Brittney Fisher tells me that patient refused her last chemotherapy as she was unsure if she wanted to pursue further treatments.  There was discussion about possible third line chemotherapy.  However, Brittney Fisher says the patient has told her that she is tired and ready for her end-of-life when it is time.  Brittney Fisher does not feel that patient can clinically improve at this point and would like to honor patient's wish focusing on comfort.  We discussed option of hospice involvement and I do think patient would be a good candidate for hospice IPU given ongoing delirium/shortness of breath.  Will consult the hospice liaison to coordinate.  PLAN: -Recommend best supportive care -Referral to hospice -DNR  Case and plan discussed with Dr. Cathie Hoops   Time Total: 30 minutes  Visit consisted of counseling and education dealing with the complex and emotionally intense issues of symptom management and palliative care in the setting of serious and potentially life-threatening illness.Greater than 50%  of this time was spent counseling and coordinating care related to the above assessment and  plan.  Signed by: Laurette Schimke, PhD, NP-C

## 2023-03-29 NOTE — Progress Notes (Signed)
Progress Note   Patient: Brittney Fisher LAG:[id number] DOB: Jan 17, 1961 DOA: 03/26/2023     2 DOS: the patient was seen and examined on 03/29/2023     Subjective:  I did see patient's this morning in the presence of the nurse Patient still with difficulty breathing Appears generally weak and acutely sick Prognosis remains poor Discussed this with patient's power of attorney who was present at bedside Palliative care have been consulted Denied any chest pain, nausea vomiting or abdominal pain     Brief hospital course: Brittney Fisher is a 62 y.o. 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 Brittney Fisher states that she has been experiencing shortness of breath chronically but has gradually worsened and especially became suddenly worse on the day of presentation. She is currently being managed for acute exacerbation of COPD   Assessment and Plan:  COPD exacerbation Patient has a history of clinically diagnosed COPD in the setting of extensive tobacco use previously.  She endorses gradually worsening shortness of breath that has significantly worsened today.  She has required increased use of her home inhalers and several nebulizer treatments on arrival.  No cough or focal opacities on imaging to suggest bacterial pneumonia.   - S/p Solu-Medrol 125 mg once - Continue prednisone 40 mg to complete a 5-day course - COVID test negative - DuoNebs every 6 hours - Continue home bronchodilators - PT/OT   Limping-right hip x-ray did not show any fracture   Chronic respiratory failure with hypoxia Patient has been able to be weaned back to her home 2 L of supplemental oxygen. - Continue supplemental oxygen to maintain oxygen saturation above 88%     Tachycardia Chronic and unchanged.   Likely due to progressive debility in the setting of metastatic cancer.   Hyponatremia Stable and mild at this time.  History of SIADH, however recent oncology note states hyponatremia likely due to dehydration.   - S/p 1 L IV fluids - Dietitian consulted   Primary malignant neoplasm of lung metastatic to other site Patient has a history of stage IV adenocarcinoma of the lung with metastasis to the bone and pleura.  She she has completed first and second line treatment without success.   Per oncology note, discussing potential third line treatment. Palliative care consulted  Goals of care, counseling/discussion Palliative care consulted by admitting provider     Sacral decubitus ulcers Stage III Wound care consulted   Normocytic anemia Stable and unchanged at this time.  Likely secondary to bony metastasis and chemotherapy.   Advance Care Planning:   Code Status: DNR/DNI.   Family Communication: Discussed with patient's niece who is the healthcare power of attorney present at bedside   Patient still continues criteria for inpatient management given her unstable condition with metabolic encephalopathy   Data Reviewed: Have reviewed patient's CBC as well as BMP results showing sodium 132 potassium 3.9 creatinine 0.35     Time spent: 50 minutes         Physical Exam:   Vitals and nursing note reviewed.  Constitutional:      Appearance: She is cachectic.  HENT:     Head: Normocephalic and atraumatic.  Cardiovascular:     Rate and Rhythm: Regular rhythm. Tachycardia present.     Heart sounds: No murmur heard. Pulmonary: Tachypneic with  use of accessory muscle Abdominal:     Palpations: Abdomen is soft.     Tenderness: There is no abdominal tenderness.  Musculoskeletal:     Cervical back: Neck supple.     Right lower leg: No tenderness. No edema.     Left lower leg: No tenderness. No edema.  Skin:    General: Skin is warm and dry.     Comments: Stage 2-3 decubitus  ulcer on the sacrum, approximately 3 cm          Vitals:   03/29/23 0538 03/29/23 0557 03/29/23 0720 03/29/23 0757  BP: 114/66   101/68  Pulse: 88   (!) 118  Resp: 18   12  Temp: (!) 97.4 F (36.3 C)   98.1 F (36.7 C)  TempSrc:      SpO2: 100%  99% 100%  Weight:  51.6 kg      Author: Loyce Dys, MD 03/29/2023 2:00 PM  For on call review [email or web address].

## 2023-03-29 NOTE — Plan of Care (Signed)
  Problem: Safety: Goal: Ability to remain free from injury will improve Outcome: Progressing   Problem: Clinical Measurements: Goal: Respiratory complications will improve Outcome: Not Progressing   Problem: Activity: Goal: Risk for activity intolerance will decrease Outcome: Not Progressing   Problem: Nutrition: Goal: Adequate nutrition will be maintained Outcome: Not Progressing

## 2023-03-29 NOTE — Progress Notes (Signed)
NT found patient at edge of bed, disoriented, and O2 off, sats at low 809's.NT placed O2 back on patient and assisted her back to bed. RN assessed patient, O2 at 4L [place], O2 Sats at 87%, HR 120-125. Patient O2 increased to 5L to assist with WOB. O2 Sats quickly returned and patient was at 100%. RN decreased O2 back to 4Ls. Patient given ativan for anxiety.

## 2023-03-29 NOTE — Progress Notes (Addendum)
Civil engineer, contracting [facility]) Hospital Liaison Note  Received request from PMT provider/Joshua Borders, NP (Transitions of Care Manager, Maralyn Sago aware) for family interest in Hospice Home. Visited patient at bedside and spoke with niece/[name] to confirm interest and explain services.  Approval for Hospice Home is determined by [facility] MD. Once [facility] MD has determined Hospice Home eligibility, [facility] will update hospital staff and family. Patient has been approved for Hospice Home.   Addendum: Unfortunately, Hospice Home is not able to offer a room today. Family and TOC aware hospital liaison will follow up tomorrow or sooner if a room becomes available.    Please do not hesitate to call with any hospice related questions.    Thank you for the opportunity to participate in this patient's care.   Eugenie Birks, MSW [facility] Hospital Liaison [phone]

## 2023-03-29 NOTE — Progress Notes (Signed)
Mobility Specialist - Progress Note   03/29/23 1000  Mobility  Activity Refused mobility     Pt politely declined mobility at this time; reports not feeling well this AM. Pt requests to return at 1pm to attempt OOB mobility. Will attempt as time permits.    Filiberto Pinks Mobility Specialist 03/29/23, 10:18 AM

## 2023-03-29 NOTE — TOC Progression Note (Signed)
Transition of Care [facility]) - Progression Note    Patient Details  Name: Brittney Fisher MRN: [id number] Date of Birth: August 29, 1961  [facility] CM/SW Contact  Margarito Liner, LCSW Phone Number: 03/29/2023, 2:26 PM  Clinical Narrative:   Per palliative, niece is interested in the hospice home. CSW called niece and confirmed preference for [facility]. [facility] liaison is aware.  Expected Discharge Plan and Services                                               Social Determinants of Health (SDOH) Interventions SDOH Screenings   Food Insecurity: No Food Insecurity (01/12/2023)  Housing: Low Risk  (01/12/2023)  Transportation Needs: No Transportation Needs (01/12/2023)  Utilities: Not At Risk (01/12/2023)  Alcohol Screen: Low Risk  (02/19/2023)  Depression (PHQ2-9): Low Risk  (02/19/2023)  Financial Resource Strain: Low Risk  (02/19/2023)  Physical Activity: Inactive (02/19/2023)  Social Connections: Moderately Integrated (02/19/2023)  Stress: Stress Concern Present (02/19/2023)  Tobacco Use: Medium Risk (03/21/2023)    Readmission Risk Interventions    03/28/2023    9:22 AM 01/14/2023   10:20 AM  Readmission Risk Prevention Plan  Transportation Screening Complete Complete  PCP or Specialist Appt within 3-5 Days  Complete  Social Work Consult for Recovery Care Planning/Counseling Complete Complete  Palliative Care Screening Not Applicable Not Applicable  Medication Review Oceanographer) Complete Complete

## 2023-03-30 DIAGNOSIS — J441 Chronic obstructive pulmonary disease with (acute) exacerbation: Secondary | ICD-10-CM | POA: Diagnosis not present

## 2023-03-30 LAB — BASIC METABOLIC PANEL
Anion gap: 11 (ref 5–15)
BUN: 15 mg/dL (ref 8–23)
CO2: 35 mmol/L — ABNORMAL HIGH (ref 22–32)
Calcium: 8.7 mg/dL — ABNORMAL LOW (ref 8.9–10.3)
Chloride: 87 mmol/L — ABNORMAL LOW (ref 98–111)
Creatinine, Ser: 0.37 mg/dL — ABNORMAL LOW (ref 0.44–1.00)
GFR, Estimated: >60 mL/min (ref >60)
Glucose, Bld: 88 mg/dL (ref 70–99)
Potassium: 3.8 mmol/L (ref 3.5–5.1)
Sodium: 133 mmol/L — ABNORMAL LOW (ref 135–145)

## 2023-03-30 LAB — CBC WITH DIFFERENTIAL/PLATELET
Abs Immature Granulocytes: 0.03 10*3/uL (ref 0.00–0.07)
Basophils Absolute: 0 10*3/uL (ref 0.0–0.1)
Basophils Relative: 0 %
Eosinophils Absolute: 0 10*3/uL (ref 0.0–0.5)
Eosinophils Relative: 0 %
HCT: 30.8 % — ABNORMAL LOW (ref 36.0–46.0)
Hemoglobin: 9.4 g/dL — ABNORMAL LOW (ref 12.0–15.0)
Immature Granulocytes: 0 %
Lymphocytes Relative: 4 %
Lymphs Abs: 0.4 10*3/uL — ABNORMAL LOW (ref 0.7–4.0)
MCH: 27 pg (ref 26.0–34.0)
MCHC: 30.5 g/dL (ref 30.0–36.0)
MCV: 88.5 fL (ref 80.0–100.0)
Monocytes Absolute: 1 10*3/uL (ref 0.1–1.0)
Monocytes Relative: 11 %
Neutro Abs: 8.1 10*3/uL — ABNORMAL HIGH (ref 1.7–7.7)
Neutrophils Relative %: 85 %
Platelets: 190 10*3/uL (ref 150–400)
RBC: 3.48 MIL/uL — ABNORMAL LOW (ref 3.87–5.11)
RDW: 14.6 % (ref 11.5–15.5)
WBC: 9.5 10*3/uL (ref 4.0–10.5)
nRBC: 0 % (ref 0.0–0.2)

## 2023-03-30 MED ORDER — HALOPERIDOL LACTATE 2 MG/ML PO CONC: 0.5000 mg | ORAL | 0 refills | Status: DC | PRN

## 2023-03-30 MED ORDER — MORPHINE SULFATE (PF) 2 MG/ML IV SOLN
1.0000 mg | INTRAVENOUS | Status: DC | PRN
  Administered 2023-03-30 (×2): 1 mg via INTRAVENOUS
  Filled 2023-03-30 (×2): qty 1

## 2023-03-30 MED ORDER — MORPHINE SULFATE (CONCENTRATE) 10 MG/0.5ML PO SOLN: 5.0000 mg | ORAL | 0 refills | Status: DC | PRN

## 2023-03-30 MED ORDER — MORPHINE SULFATE (CONCENTRATE) 10 MG/0.5ML PO SOLN: 5.0000 mg | ORAL | Status: DC | PRN

## 2023-03-30 MED ORDER — LORAZEPAM 2 MG/ML PO CONC: 2.0000 mg | ORAL | 0 refills | Status: DC | PRN

## 2023-03-30 MED ORDER — LORAZEPAM 2 MG/ML PO CONC: 2.0000 mg | ORAL | Status: DC | PRN

## 2023-03-30 MED ORDER — SCOPOLAMINE 1 MG/3DAYS TD PT72
1.0000 | MEDICATED_PATCH | TRANSDERMAL | Status: DC
  Filled 2023-03-30 (×2): qty 1

## 2023-03-30 MED ORDER — SCOPOLAMINE 1 MG/3DAYS TD PT72: 1.0000 | MEDICATED_PATCH | TRANSDERMAL | 12 refills | Status: DC

## 2023-03-30 MED ORDER — LORAZEPAM 2 MG/ML IJ SOLN: 0.0500 mg/kg | INTRAVENOUS | Status: DC | PRN

## 2023-03-30 NOTE — Progress Notes (Signed)
85- Report given to [facility] @[phone].  2130- Pt left unit via EMS to [facility].

## 2023-03-30 NOTE — TOC Transition Note (Addendum)
Transition of Care [facility]) - CM/SW Discharge Note   Patient Details  Name: Brittney Fisher MRN: [id number] Date of Birth: 06/04/1961  [facility]) CM/SW Contact:  Bing Quarry, RN Phone Number: 03/30/2023, 9:55 AM   Clinical Narrative: 03/30/23:  Bed available today at inpatient hospice facility via [facility]. EMS forms printed to unit excluding DNR forms to unit printer and updated unit RN and hospice liaison on call. [facility] transport will be arranged after consents signed via family and hospice liaison.    Gabriel Cirri RN CM    Update from liaison: Number to call report is [phone] Gabriel Cirri RN CM   215 pm: [facility] transport to inpatient hospice arranged for 8 pm or after. Forms excluding DNR form already printed to unit earlier. Gabriel Cirri RN CM    Final next level of care: Hospice Medical Facility Barriers to Discharge: Barriers Resolved   Patient Goals and CMS Choice CMS Medicare.gov Compare Post Acute Care list provided to:: Other (Comment Required) Cyndi Lennert (Niece)  [phone] (Mobile)) Choice offered to / list presented to :  Cyndi Lennert (Niece)  [phone] (Mobile))  Discharge Placement                Patient chooses bed at:  [facility] In[facility] hosp[facility] facility.) Patient to be transferred to facility by: [facility] Name of family member notified: Cyndi Lennert (Niece)  [phone] (Mobile) Patient and family notified of of transfer: 03/30/23 (By hospice liaison.)  Discharge Plan and Services Additional resources added to the After Visit Summary for                  DME Arranged:  (Per [facility] inpatient facility)                    Social Determinants of Health (SDOH) Interventions SDOH Screenings   Food Insecurity: No Food Insecurity (01/12/2023)  Housing: Low Risk  (01/12/2023)  Transportation Needs: No Transportation Needs (01/12/2023)  Utilities: Not At Risk (01/12/2023)  Alcohol Screen: Low Risk   (02/19/2023)  Depression (PHQ2-9): Low Risk  (02/19/2023)  Financial Resource Strain: Low Risk  (02/19/2023)  Physical Activity: Inactive (02/19/2023)  Social Connections: Moderately Integrated (02/19/2023)  Stress: Stress Concern Present (02/19/2023)  Tobacco Use: Medium Risk (03/21/2023)     Readmission Risk Interventions    03/28/2023    9:22 AM 01/14/2023   10:20 AM  Readmission Risk Prevention Plan  Transportation Screening Complete Complete  PCP or Specialist Appt within 3-5 Days  Complete  Social Work Consult for Recovery Care Planning/Counseling Complete Complete  Palliative Care Screening Not Applicable Not Applicable  Medication Review Oceanographer) Complete Complete

## 2023-03-30 NOTE — Progress Notes (Signed)
[facility] ([facility])   Consent forms have been completed.  EMS notified of patient D/C and transport arranged. TOC/ and Attending Physician/ also notified of transport arrangement.    Please send signed DNR form with patient and RN call report to [phone].    Shania Junious, MSW [facility] Hospital Liaison [phone]  

## 2023-03-30 NOTE — Progress Notes (Signed)
Per notes patient has been approved for hospice home and POA [name] (niece),would like to focus on comfort care. Currently patient is receiving q2 hour vital checks due to MEWS score and labs were collected this am. MD notified for further instructions and comfort care orders while awaiting bed placement at hospice.

## 2023-03-30 NOTE — Discharge Summary (Signed)
Physician Discharge Summary   Patient: Brittney Fisher MRN: [id number] DOB: [birth year]  Admit date:     03/26/2023  Discharge date: 03/30/23  Discharge Physician: Loyce Dys   PCP: Pcp, No    Discharge Diagnoses: COPD exacerbation Chronic respiratory failure with hypoxia Tachycardia- Chronic and unchanged.  Hyponatremia due to SIADH Primary malignant neoplasm of lung metastatic to other site Goals of care, counseling/discussion Sacral decubitus ulcers Stage III Normocytic anemia  Hospital Course: Brittney Fisher is a 62 y.o. 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 Mrs. Bankey states that she has been experiencing shortness of breath chronically but has gradually worsened and especially became suddenly worse on the day of presentation. He was admitted for management of COPD exacerbation.  Her respiratory function did not improve and she will continue to have generalized weakness with associated failure to thrive.  Given her poor prognosis with underlying stage IV malignancy, palliative care was consulted.  Goals of care discussed with patient's healthcare power of attorney and decision was made for comfort care measures only.  They agreed for patient's current medication to be discontinued except comfort care measures only. patient has been accepted by hospice and they will continue management at a hospice facility.       Consultants: Palliative care Procedures performed: None Disposition: Hospice care Diet recommendation:  Discharge Diet Orders (From admission, onward)     Start     Ordered   03/30/23 0000  Diet - low sodium heart healthy        03/30/23 1030           Regular diet DISCHARGE MEDICATION: Allergies as of 03/30/2023       Reactions    Carboplatin Shortness Of Breath, Itching, Cough        Medication List     STOP taking these medications    acetaminophen 500 MG tablet Commonly known as: TYLENOL   albuterol 108 (90 Base) MCG/ACT inhaler Commonly known as: VENTOLIN HFA   azithromycin 250 MG tablet Commonly known as: Zithromax   benzonatate 100 MG capsule Commonly known as: TESSALON   dexamethasone 2 MG tablet Commonly known as: DECADRON   dronabinol 5 MG capsule Commonly known as: [name]   fluticasone-salmeterol 250-50 MCG/ACT Aepb Commonly known as: Advair Diskus   gabapentin 100 MG capsule Commonly known as: Neurontin   ipratropium-albuterol 0.5-2.5 (3) MG/3ML Soln Commonly known as: DUONEB   LORazepam 0.5 MG tablet Commonly known as: ATIVAN Replaced by: LORazepam 2 MG/ML concentrated solution   naloxone 4 MG/0.1ML Liqd nasal spray kit Commonly known as: NARCAN   omeprazole 20 MG capsule Commonly known as: PRILOSEC   oxyCODONE 10 mg 12 hr tablet Commonly known as: OXYCONTIN   oxyCODONE 5 MG immediate release tablet Commonly known as: Oxy IR/ROXICODONE   protein supplement shake Liqd Commonly known as: PREMIER PROTEIN   sennosides-docusate sodium 8.6-50 MG tablet Commonly known as: SENOKOT-S       TAKE these medications    haloperidol 2 MG/ML solution Commonly known as: HALDOL Place 0.3 mLs (0.6 mg total) under the tongue every 4 (four) hours as needed for agitation.   LORazepam 2 MG/ML concentrated solution Commonly known as: ATIVAN Take 1 mL (2 mg total) by mouth every hour as needed (ANXIETY/COMFORT MEASURES). Replaces: LORazepam 0.5 MG tablet  morphine CONCENTRATE 10 MG/0.5ML Soln concentrated solution Place 0.25 mLs (5 mg total) under the tongue every hour as needed for severe pain, shortness of breath or moderate pain.   scopolamine 1 MG/3DAYS Commonly known as: TRANSDERM-SCOP Place 1 patch (1.5 mg total) onto the skin every 3 (three) days.                Discharge Care Instructions  (From admission, onward)           Start     Ordered   03/30/23 0000  No dressing needed        03/30/23 1030            Follow-up Information     Margaretann Loveless, MD. Go on 04/08/2023.   Specialty: Internal Medicine Why: New Primary Care appointment Arrive at 12:45, bring your ID, insurance card, and all of your home medications in their orginal bottles Contact information: [address] [place] [address] [phone]                Discharge Exam: Filed Weights   03/28/23 0457 03/29/23 0557 03/30/23 0500  Weight: 52.5 kg 51.6 kg 51.5 kg   Vitals and nursing note reviewed.  Constitutional:      Appearance: She is cachectic.  Confused: Back comfortable on comfort care measures HENT:     Head: Normocephalic and atraumatic.  Cardiovascular:     Rate and Rhythm: Regular rhythm. Tachycardia present.     Heart sounds: No murmur heard. Pulmonary: Tachypneic with use of accessory muscle Abdominal:     Palpations: Abdomen is soft.     Tenderness: There is no abdominal tenderness.  Musculoskeletal:     Cervical back: Neck supple.  Skin:    General: Skin is warm and dry.     Comments: Stage 3 decubitus ulcer on the sacrum, approximately 3 cm   Condition at discharge: serious  Discharge time spent: greater than 30 minutes.  Signed: Loyce Dys, MD [facility] 03/30/2023

## 2023-03-30 NOTE — Progress Notes (Signed)
Mobility Specialist - Progress Note    03/30/23 1046  Mobility  Activity Transferred to/from [facility];Transferred from bed to chair;Dangled on edge of bed  Level of Assistance Moderate assist, patient does 50-74%  Assistive Device None  Activity Response Tolerated well  Mobility Referral Yes  $Mobility charge 1 Mobility   Author responding to bed alarm Pt trying to get OOB on 4L upon entry. Pt transferred to [facility] ModA and NT took over session. Pt left with needs in reach and NT present at beside.   Johnathan Hausen Mobility Specialist 03/30/23, 10:51 AM

## 2023-04-03 ENCOUNTER — Telehealth: Payer: Medicaid Other | Admitting: Hospice and Palliative Medicine

## 2023-04-08 ENCOUNTER — Ambulatory Visit: Payer: Medicare Other | Admitting: Internal Medicine

## 2023-04-09 ENCOUNTER — Ambulatory Visit: Payer: Medicare Other | Admitting: Physician Assistant

## 2023-04-09 ENCOUNTER — Telehealth: Payer: Self-pay | Admitting: Pulmonary Disease

## 2023-04-09 NOTE — Telephone Encounter (Signed)
Patient's relative called to inform the doctor that the patient has passed away.

## 2023-04-09 NOTE — Telephone Encounter (Signed)
Lm for [place].

## 2023-04-17 DEATH — deceased

## 2023-04-18 ENCOUNTER — Encounter: Payer: Self-pay | Admitting: Oncology

## 2023-05-28 ENCOUNTER — Ambulatory Visit: Payer: Medicare Other | Admitting: Pulmonary Disease
